# Patient Record
Sex: Female | Born: 1950 | Hispanic: Yes | Smoking: Former smoker
Health system: Southern US, Community
[De-identification: ages and names within clinical notes are randomized; demographics above are authoritative.]

## PROBLEM LIST (undated history)

## (undated) DIAGNOSIS — R519 Headache, unspecified: Secondary | ICD-10-CM

## (undated) DIAGNOSIS — H9190 Unspecified hearing loss, unspecified ear: Secondary | ICD-10-CM

## (undated) DIAGNOSIS — K589 Irritable bowel syndrome without diarrhea: Secondary | ICD-10-CM

## (undated) DIAGNOSIS — R011 Cardiac murmur, unspecified: Secondary | ICD-10-CM

## (undated) DIAGNOSIS — I219 Acute myocardial infarction, unspecified: Secondary | ICD-10-CM

## (undated) DIAGNOSIS — G8929 Other chronic pain: Secondary | ICD-10-CM

## (undated) DIAGNOSIS — Z9289 Personal history of other medical treatment: Secondary | ICD-10-CM

## (undated) DIAGNOSIS — E119 Type 2 diabetes mellitus without complications: Secondary | ICD-10-CM

## (undated) DIAGNOSIS — M549 Dorsalgia, unspecified: Secondary | ICD-10-CM

## (undated) DIAGNOSIS — K219 Gastro-esophageal reflux disease without esophagitis: Secondary | ICD-10-CM

## (undated) DIAGNOSIS — I2510 Atherosclerotic heart disease of native coronary artery without angina pectoris: Secondary | ICD-10-CM

## (undated) DIAGNOSIS — I509 Heart failure, unspecified: Secondary | ICD-10-CM

## (undated) DIAGNOSIS — N186 End stage renal disease: Secondary | ICD-10-CM

## (undated) DIAGNOSIS — E785 Hyperlipidemia, unspecified: Secondary | ICD-10-CM

## (undated) DIAGNOSIS — G4489 Other headache syndrome: Secondary | ICD-10-CM

## (undated) DIAGNOSIS — J189 Pneumonia, unspecified organism: Secondary | ICD-10-CM

## (undated) DIAGNOSIS — I350 Nonrheumatic aortic (valve) stenosis: Secondary | ICD-10-CM

## (undated) DIAGNOSIS — D649 Anemia, unspecified: Secondary | ICD-10-CM

## (undated) DIAGNOSIS — R269 Unspecified abnormalities of gait and mobility: Secondary | ICD-10-CM

## (undated) DIAGNOSIS — E039 Hypothyroidism, unspecified: Secondary | ICD-10-CM

## (undated) DIAGNOSIS — I639 Cerebral infarction, unspecified: Secondary | ICD-10-CM

## (undated) DIAGNOSIS — R51 Headache: Secondary | ICD-10-CM

## (undated) DIAGNOSIS — Z992 Dependence on renal dialysis: Secondary | ICD-10-CM

## (undated) DIAGNOSIS — M47812 Spondylosis without myelopathy or radiculopathy, cervical region: Secondary | ICD-10-CM

## (undated) DIAGNOSIS — M12811 Other specific arthropathies, not elsewhere classified, right shoulder: Secondary | ICD-10-CM

## (undated) DIAGNOSIS — M1990 Unspecified osteoarthritis, unspecified site: Secondary | ICD-10-CM

## (undated) DIAGNOSIS — I10 Essential (primary) hypertension: Secondary | ICD-10-CM

## (undated) DIAGNOSIS — J45909 Unspecified asthma, uncomplicated: Secondary | ICD-10-CM

## (undated) HISTORY — DX: Spondylosis without myelopathy or radiculopathy, cervical region: M47.812

## (undated) HISTORY — PX: CORONARY ANGIOPLASTY WITH STENT PLACEMENT: SHX49

## (undated) HISTORY — DX: Atherosclerotic heart disease of native coronary artery without angina pectoris: I25.10

## (undated) HISTORY — PX: UMBILICAL HERNIA REPAIR: SHX196

## (undated) HISTORY — PX: CHOLECYSTECTOMY OPEN: SUR202

## (undated) HISTORY — PX: CORONARY ANGIOPLASTY: SHX604

## (undated) HISTORY — PX: PARATHYROIDECTOMY: SHX19

## (undated) HISTORY — DX: Anemia, unspecified: D64.9

## (undated) HISTORY — DX: Acute myocardial infarction, unspecified: I21.9

## (undated) HISTORY — DX: Heart failure, unspecified: I50.9

## (undated) HISTORY — DX: Cerebral infarction, unspecified: I63.9

## (undated) HISTORY — DX: Other headache syndrome: G44.89

## (undated) HISTORY — DX: Unspecified abnormalities of gait and mobility: R26.9

## (undated) HISTORY — PX: APPENDECTOMY: SHX54

---

## 1987-06-09 HISTORY — PX: TUBAL LIGATION: SHX77

## 1994-06-08 DIAGNOSIS — Z9289 Personal history of other medical treatment: Secondary | ICD-10-CM

## 1994-06-08 HISTORY — DX: Personal history of other medical treatment: Z92.89

## 2002-06-08 DIAGNOSIS — I219 Acute myocardial infarction, unspecified: Secondary | ICD-10-CM

## 2002-06-08 HISTORY — DX: Acute myocardial infarction, unspecified: I21.9

## 2006-11-03 ENCOUNTER — Ambulatory Visit: Payer: Self-pay | Admitting: Vascular Surgery

## 2006-11-22 ENCOUNTER — Ambulatory Visit: Payer: Self-pay | Admitting: Vascular Surgery

## 2006-12-03 ENCOUNTER — Ambulatory Visit: Payer: Self-pay | Admitting: Vascular Surgery

## 2006-12-03 ENCOUNTER — Ambulatory Visit (HOSPITAL_COMMUNITY): Admission: RE | Admit: 2006-12-03 | Discharge: 2006-12-03 | Payer: Self-pay | Admitting: Vascular Surgery

## 2006-12-03 HISTORY — PX: AV FISTULA PLACEMENT: SHX1204

## 2007-11-01 ENCOUNTER — Ambulatory Visit: Payer: Self-pay | Admitting: Vascular Surgery

## 2008-06-08 HISTORY — PX: INSERTION OF DIALYSIS CATHETER: SHX1324

## 2008-09-10 ENCOUNTER — Inpatient Hospital Stay (HOSPITAL_COMMUNITY): Admission: RE | Admit: 2008-09-10 | Discharge: 2008-09-16 | Payer: Self-pay | Admitting: Surgery

## 2008-09-10 ENCOUNTER — Encounter (INDEPENDENT_AMBULATORY_CARE_PROVIDER_SITE_OTHER): Payer: Self-pay | Admitting: Surgery

## 2009-10-16 ENCOUNTER — Inpatient Hospital Stay (HOSPITAL_COMMUNITY): Admission: EM | Admit: 2009-10-16 | Discharge: 2009-10-18 | Payer: Self-pay | Admitting: Emergency Medicine

## 2009-10-18 ENCOUNTER — Encounter (INDEPENDENT_AMBULATORY_CARE_PROVIDER_SITE_OTHER): Payer: Self-pay | Admitting: Internal Medicine

## 2009-11-15 ENCOUNTER — Ambulatory Visit: Payer: Self-pay | Admitting: Vascular Surgery

## 2009-11-19 ENCOUNTER — Emergency Department (HOSPITAL_COMMUNITY): Admission: EM | Admit: 2009-11-19 | Discharge: 2009-11-19 | Payer: Self-pay | Admitting: Emergency Medicine

## 2009-11-25 ENCOUNTER — Ambulatory Visit (HOSPITAL_COMMUNITY): Admission: RE | Admit: 2009-11-25 | Discharge: 2009-11-25 | Payer: Self-pay | Admitting: Vascular Surgery

## 2009-11-25 ENCOUNTER — Ambulatory Visit: Payer: Self-pay | Admitting: Vascular Surgery

## 2009-11-25 HISTORY — PX: AV FISTULA REPAIR: SHX563

## 2010-07-30 HISTORY — PX: COLONOSCOPY W/ POLYPECTOMY: SHX1380

## 2010-08-12 ENCOUNTER — Emergency Department (HOSPITAL_COMMUNITY)
Admission: EM | Admit: 2010-08-12 | Discharge: 2010-08-12 | Disposition: A | Discharge status: Home / Self Care | Payer: Managed Care, Other (non HMO) | Attending: Emergency Medicine | Admitting: Emergency Medicine

## 2010-08-12 DIAGNOSIS — E119 Type 2 diabetes mellitus without complications: Secondary | ICD-10-CM | POA: Insufficient documentation

## 2010-08-12 DIAGNOSIS — Z79899 Other long term (current) drug therapy: Secondary | ICD-10-CM | POA: Insufficient documentation

## 2010-08-12 DIAGNOSIS — M545 Low back pain, unspecified: Secondary | ICD-10-CM | POA: Insufficient documentation

## 2010-08-12 DIAGNOSIS — Z7982 Long term (current) use of aspirin: Secondary | ICD-10-CM | POA: Insufficient documentation

## 2010-08-12 DIAGNOSIS — R109 Unspecified abdominal pain: Secondary | ICD-10-CM | POA: Insufficient documentation

## 2010-08-12 DIAGNOSIS — Z992 Dependence on renal dialysis: Secondary | ICD-10-CM | POA: Insufficient documentation

## 2010-08-12 DIAGNOSIS — N189 Chronic kidney disease, unspecified: Secondary | ICD-10-CM | POA: Insufficient documentation

## 2010-08-12 DIAGNOSIS — I129 Hypertensive chronic kidney disease with stage 1 through stage 4 chronic kidney disease, or unspecified chronic kidney disease: Secondary | ICD-10-CM | POA: Insufficient documentation

## 2010-08-12 DIAGNOSIS — M546 Pain in thoracic spine: Secondary | ICD-10-CM | POA: Insufficient documentation

## 2010-08-12 LAB — URINALYSIS, ROUTINE W REFLEX MICROSCOPIC
Bilirubin Urine: NEGATIVE
Glucose, UA: NEGATIVE mg/dL
Ketones, ur: NEGATIVE mg/dL
Leukocytes, UA: NEGATIVE
pH: 7 (ref 5.0–8.0)

## 2010-08-12 LAB — URINE MICROSCOPIC-ADD ON

## 2010-08-18 LAB — GLUCOSE, CAPILLARY: Glucose-Capillary: 121 mg/dL — ABNORMAL HIGH (ref 70–99)

## 2010-08-24 LAB — POCT I-STAT 4, (NA,K, GLUC, HGB,HCT)
HCT: 38 % (ref 36.0–46.0)
Hemoglobin: 12.9 g/dL (ref 12.0–15.0)
Potassium: 4.1 mEq/L (ref 3.5–5.1)

## 2010-08-25 LAB — COMPREHENSIVE METABOLIC PANEL
ALT: 21 U/L (ref 0–35)
AST: 22 U/L (ref 0–37)
Alkaline Phosphatase: 86 U/L (ref 39–117)
BUN: 72 mg/dL — ABNORMAL HIGH (ref 6–23)
Calcium: 7.2 mg/dL — ABNORMAL LOW (ref 8.4–10.5)
Chloride: 97 mEq/L (ref 96–112)
Creatinine, Ser: 9.14 mg/dL — ABNORMAL HIGH (ref 0.4–1.2)
GFR calc Af Amer: 5 mL/min — ABNORMAL LOW (ref >60)
GFR calc non Af Amer: 4 mL/min — ABNORMAL LOW (ref >60)
Sodium: 135 mEq/L (ref 135–145)

## 2010-08-25 LAB — URINALYSIS, ROUTINE W REFLEX MICROSCOPIC
Ketones, ur: NEGATIVE mg/dL
Leukocytes, UA: NEGATIVE
Nitrite: NEGATIVE
Specific Gravity, Urine: 1.011 (ref 1.005–1.030)
Urobilinogen, UA: 0.2 mg/dL (ref 0.0–1.0)
pH: 5.5 (ref 5.0–8.0)

## 2010-08-25 LAB — DIFFERENTIAL
Basophils Absolute: 0 10*3/uL (ref 0.0–0.1)
Basophils Relative: 1 % (ref 0–1)
Eosinophils Absolute: 0.8 10*3/uL — ABNORMAL HIGH (ref 0.0–0.7)
Lymphs Abs: 1.5 10*3/uL (ref 0.7–4.0)
Neutrophils Relative %: 61 % (ref 43–77)

## 2010-08-25 LAB — CBC
Hemoglobin: 11.8 g/dL — ABNORMAL LOW (ref 12.0–15.0)
MCHC: 34.5 g/dL (ref 30.0–36.0)
WBC: 7.5 10*3/uL (ref 4.0–10.5)

## 2010-08-25 LAB — LIPASE, BLOOD: Lipase: 34 U/L (ref 11–59)

## 2010-08-25 LAB — URINE MICROSCOPIC-ADD ON

## 2010-08-26 LAB — URINALYSIS, ROUTINE W REFLEX MICROSCOPIC
Bilirubin Urine: NEGATIVE
Glucose, UA: 100 mg/dL — AB
Protein, ur: 100 mg/dL — AB
Specific Gravity, Urine: 1.011 (ref 1.005–1.030)
Urobilinogen, UA: 0.2 mg/dL (ref 0.0–1.0)

## 2010-08-26 LAB — BASIC METABOLIC PANEL
BUN: 42 mg/dL — ABNORMAL HIGH (ref 6–23)
CO2: 22 mEq/L (ref 19–32)
Chloride: 98 mEq/L (ref 96–112)
GFR calc non Af Amer: 6 mL/min — ABNORMAL LOW (ref >60)
Glucose, Bld: 176 mg/dL — ABNORMAL HIGH (ref 70–99)
Potassium: 4.4 mEq/L (ref 3.5–5.1)

## 2010-08-26 LAB — URINE MICROSCOPIC-ADD ON

## 2010-08-26 LAB — POCT CARDIAC MARKERS
Myoglobin, poc: 431 ng/mL (ref 12–200)
Myoglobin, poc: 460 ng/mL (ref 12–200)

## 2010-08-26 LAB — COMPREHENSIVE METABOLIC PANEL
AST: 22 U/L (ref 0–37)
Albumin: 3.8 g/dL (ref 3.5–5.2)
Calcium: 9.4 mg/dL (ref 8.4–10.5)
Creatinine, Ser: 5.47 mg/dL — ABNORMAL HIGH (ref 0.4–1.2)
GFR calc Af Amer: 10 mL/min — ABNORMAL LOW (ref >60)
GFR calc non Af Amer: 8 mL/min — ABNORMAL LOW (ref >60)
Total Protein: 8.5 g/dL — ABNORMAL HIGH (ref 6.0–8.3)

## 2010-08-26 LAB — POCT I-STAT, CHEM 8
BUN: 35 mg/dL — ABNORMAL HIGH (ref 6–23)
Calcium, Ion: 1.07 mmol/L — ABNORMAL LOW (ref 1.12–1.32)
Chloride: 102 mEq/L (ref 96–112)
Glucose, Bld: 186 mg/dL — ABNORMAL HIGH (ref 70–99)
TCO2: 27 mmol/L (ref 0–100)

## 2010-08-26 LAB — RENAL FUNCTION PANEL
Albumin: 3.6 g/dL (ref 3.5–5.2)
CO2: 24 mEq/L (ref 19–32)
Calcium: 8.5 mg/dL (ref 8.4–10.5)
Chloride: 101 mEq/L (ref 96–112)
GFR calc Af Amer: 6 mL/min — ABNORMAL LOW (ref >60)
GFR calc non Af Amer: 5 mL/min — ABNORMAL LOW (ref >60)
Sodium: 136 mEq/L (ref 135–145)

## 2010-08-26 LAB — OVA AND PARASITE EXAMINATION

## 2010-08-26 LAB — CBC
HCT: 30 % — ABNORMAL LOW (ref 36.0–46.0)
HCT: 35.1 % — ABNORMAL LOW (ref 36.0–46.0)
Hemoglobin: 12.2 g/dL (ref 12.0–15.0)
MCHC: 34.3 g/dL (ref 30.0–36.0)
MCHC: 34.9 g/dL (ref 30.0–36.0)
MCV: 104.8 fL — ABNORMAL HIGH (ref 78.0–100.0)
MCV: 105.5 fL — ABNORMAL HIGH (ref 78.0–100.0)
Platelets: 252 10*3/uL (ref 150–400)
Platelets: 272 10*3/uL (ref 150–400)
RBC: 3.36 MIL/uL — ABNORMAL LOW (ref 3.87–5.11)
RDW: 16.8 % — ABNORMAL HIGH (ref 11.5–15.5)
WBC: 13.5 10*3/uL — ABNORMAL HIGH (ref 4.0–10.5)
WBC: 8.5 10*3/uL (ref 4.0–10.5)

## 2010-08-26 LAB — GLUCOSE, CAPILLARY
Glucose-Capillary: 156 mg/dL — ABNORMAL HIGH (ref 70–99)
Glucose-Capillary: 172 mg/dL — ABNORMAL HIGH (ref 70–99)
Glucose-Capillary: 173 mg/dL — ABNORMAL HIGH (ref 70–99)
Glucose-Capillary: 186 mg/dL — ABNORMAL HIGH (ref 70–99)
Glucose-Capillary: 189 mg/dL — ABNORMAL HIGH (ref 70–99)

## 2010-08-26 LAB — CULTURE, BLOOD (ROUTINE X 2): Culture: NO GROWTH

## 2010-08-26 LAB — DIFFERENTIAL
Lymphocytes Relative: 7 % — ABNORMAL LOW (ref 12–46)
Monocytes Relative: 4 % (ref 3–12)
Neutro Abs: 11.5 10*3/uL — ABNORMAL HIGH (ref 1.7–7.7)
Neutrophils Relative %: 85 % — ABNORMAL HIGH (ref 43–77)

## 2010-08-26 LAB — CARDIAC PANEL(CRET KIN+CKTOT+MB+TROPI)
CK, MB: 1.1 ng/mL (ref 0.3–4.0)
Relative Index: 1.1 (ref 0.0–2.5)
Total CK: 100 U/L (ref 7–177)
Troponin I: 0.02 ng/mL (ref 0.00–0.06)
Troponin I: 0.11 ng/mL — ABNORMAL HIGH (ref 0.00–0.06)

## 2010-08-26 LAB — LIPID PANEL
LDL Cholesterol: UNDETERMINED mg/dL (ref 0–99)
Triglycerides: 443 mg/dL — ABNORMAL HIGH (ref <150)
VLDL: UNDETERMINED mg/dL (ref 0–40)

## 2010-08-26 LAB — HEMOGLOBIN A1C: Mean Plasma Glucose: 151 mg/dL — ABNORMAL HIGH (ref <117)

## 2010-08-26 LAB — CK TOTAL AND CKMB (NOT AT ARMC): Relative Index: INVALID (ref 0.0–2.5)

## 2010-09-17 LAB — RENAL FUNCTION PANEL
Albumin: 3.2 g/dL — ABNORMAL LOW (ref 3.5–5.2)
Albumin: 3.3 g/dL — ABNORMAL LOW (ref 3.5–5.2)
Albumin: 3.4 g/dL — ABNORMAL LOW (ref 3.5–5.2)
Albumin: 3.9 g/dL (ref 3.5–5.2)
BUN: 68 mg/dL — ABNORMAL HIGH (ref 6–23)
BUN: 71 mg/dL — ABNORMAL HIGH (ref 6–23)
BUN: 92 mg/dL — ABNORMAL HIGH (ref 6–23)
BUN: 94 mg/dL — ABNORMAL HIGH (ref 6–23)
BUN: 98 mg/dL — ABNORMAL HIGH (ref 6–23)
BUN: 99 mg/dL — ABNORMAL HIGH (ref 6–23)
CO2: 19 mEq/L (ref 19–32)
CO2: 19 mEq/L (ref 19–32)
CO2: 22 mEq/L (ref 19–32)
CO2: 22 mEq/L (ref 19–32)
CO2: 23 mEq/L (ref 19–32)
Calcium: 6.8 mg/dL — ABNORMAL LOW (ref 8.4–10.5)
Calcium: 7.1 mg/dL — ABNORMAL LOW (ref 8.4–10.5)
Calcium: 7.2 mg/dL — ABNORMAL LOW (ref 8.4–10.5)
Chloride: 103 mEq/L (ref 96–112)
Chloride: 105 mEq/L (ref 96–112)
Chloride: 105 mEq/L (ref 96–112)
Chloride: 97 mEq/L (ref 96–112)
Chloride: 99 mEq/L (ref 96–112)
Creatinine, Ser: 4.53 mg/dL — ABNORMAL HIGH (ref 0.4–1.2)
Creatinine, Ser: 7.39 mg/dL — ABNORMAL HIGH (ref 0.4–1.2)
Creatinine, Ser: 7.89 mg/dL — ABNORMAL HIGH (ref 0.4–1.2)
Creatinine, Ser: 7.93 mg/dL — ABNORMAL HIGH (ref 0.4–1.2)
GFR calc Af Amer: 6 mL/min — ABNORMAL LOW (ref >60)
GFR calc Af Amer: 7 mL/min — ABNORMAL LOW (ref >60)
GFR calc Af Amer: 7 mL/min — ABNORMAL LOW (ref >60)
GFR calc Af Amer: 7 mL/min — ABNORMAL LOW (ref >60)
GFR calc Af Amer: 9 mL/min — ABNORMAL LOW (ref >60)
GFR calc non Af Amer: 6 mL/min — ABNORMAL LOW (ref >60)
GFR calc non Af Amer: 6 mL/min — ABNORMAL LOW (ref >60)
GFR calc non Af Amer: 7 mL/min — ABNORMAL LOW (ref >60)
Glucose, Bld: 110 mg/dL — ABNORMAL HIGH (ref 70–99)
Glucose, Bld: 113 mg/dL — ABNORMAL HIGH (ref 70–99)
Glucose, Bld: 113 mg/dL — ABNORMAL HIGH (ref 70–99)
Glucose, Bld: 129 mg/dL — ABNORMAL HIGH (ref 70–99)
Glucose, Bld: 129 mg/dL — ABNORMAL HIGH (ref 70–99)
Glucose, Bld: 133 mg/dL — ABNORMAL HIGH (ref 70–99)
Phosphorus: 3.4 mg/dL (ref 2.3–4.6)
Phosphorus: 3.6 mg/dL (ref 2.3–4.6)
Phosphorus: 3.7 mg/dL (ref 2.3–4.6)
Potassium: 3.3 mEq/L — ABNORMAL LOW (ref 3.5–5.1)
Potassium: 3.5 mEq/L (ref 3.5–5.1)
Potassium: 3.5 mEq/L (ref 3.5–5.1)
Potassium: 3.6 mEq/L (ref 3.5–5.1)
Potassium: 3.8 mEq/L (ref 3.5–5.1)
Potassium: 4.1 mEq/L (ref 3.5–5.1)
Sodium: 133 mEq/L — ABNORMAL LOW (ref 135–145)
Sodium: 136 mEq/L (ref 135–145)
Sodium: 137 mEq/L (ref 135–145)
Sodium: 137 mEq/L (ref 135–145)
Sodium: 139 mEq/L (ref 135–145)

## 2010-09-17 LAB — CALCIUM: Calcium: 9.2 mg/dL (ref 8.4–10.5)

## 2010-09-17 LAB — BASIC METABOLIC PANEL
BUN: 54 mg/dL — ABNORMAL HIGH (ref 6–23)
CO2: 25 mEq/L (ref 19–32)
Calcium: 10.5 mg/dL (ref 8.4–10.5)
Creatinine, Ser: 3.73 mg/dL — ABNORMAL HIGH (ref 0.4–1.2)
GFR calc non Af Amer: 12 mL/min — ABNORMAL LOW (ref >60)
Glucose, Bld: 95 mg/dL (ref 70–99)
Sodium: 141 mEq/L (ref 135–145)

## 2010-09-17 LAB — CBC
Hemoglobin: 12.3 g/dL (ref 12.0–15.0)
Hemoglobin: 13.1 g/dL (ref 12.0–15.0)
MCHC: 34.4 g/dL (ref 30.0–36.0)
Platelets: 192 10*3/uL (ref 150–400)
RBC: 3.51 MIL/uL — ABNORMAL LOW (ref 3.87–5.11)
RDW: 13.1 % (ref 11.5–15.5)
WBC: 7.8 10*3/uL (ref 4.0–10.5)

## 2010-09-17 LAB — DIFFERENTIAL
Basophils Absolute: 0 10*3/uL (ref 0.0–0.1)
Basophils Relative: 0 % (ref 0–1)
Lymphocytes Relative: 19 % (ref 12–46)
Monocytes Absolute: 0.5 10*3/uL (ref 0.1–1.0)
Neutro Abs: 3.3 10*3/uL (ref 1.7–7.7)
Neutrophils Relative %: 55 % (ref 43–77)

## 2010-09-17 LAB — PTH, INTACT AND CALCIUM
Calcium, Total (PTH): 7.4 mg/dL — ABNORMAL LOW (ref 8.4–10.5)
PTH: <2.5 pg/mL — ABNORMAL LOW (ref 14.0–72.0)

## 2010-09-17 LAB — POTASSIUM: Potassium: 4.1 mEq/L (ref 3.5–5.1)

## 2010-09-17 LAB — PROTIME-INR: Prothrombin Time: 14.1 seconds (ref 11.6–15.2)

## 2010-10-21 NOTE — Op Note (Signed)
NAMENASRIN, INSLEY            ACCOUNT NO.:  [id number]   MEDICAL RECORD NO.:  [id number]          PATIENT TYPE:  AMB   LOCATION:  SDS                          FACILITY:  [facility]   PHYSICIAN:  Judeth Cornfield. Scot Dock, M.D.DATE OF BIRTH:  12-Apr-1951   DATE OF PROCEDURE:  12/03/2006  DATE OF DISCHARGE:                               OPERATIVE REPORT   PREOPERATIVE DIAGNOSIS:  Chronic renal failure.   POSTOPERATIVE DIAGNOSIS:  Chronic renal failure.   PROCEDURE:  Placement of new left upper arm arteriovenous fistula.   SURGEON:  Judeth Cornfield. Scot Dock, MD   ASSISTANT:  Tanya Nones, RNFA   ANESTHESIA:  Local with sedation.   TECHNIQUE:  The patient was taken to the operating room and sedated by  Anesthesia.  The left upper extremity was prepped and draped in the  usual sterile fashion.  After the skin was infiltrated with 1%  lidocaine, an incision was made over the cephalic vein at the wrist;  however, upon exploration, this vein turned out to be quite small and I  did not think this was a good vein for a fistula in the forearm.  This  wound was closed with a deep layer of 3-0 Vicryl and the skin closed  with 4-0 Vicryl.  Next, I explored the upper arm cephalic vein through a  transverse incision after the skin was anesthetized.  The upper arm  cephalic vein was much larger and more superficial; I thought this was a  good vein for a fistula.  Beneath the fascia, the brachial artery was  dissected free.  The vein was ligated distally and irrigated up nicely  with heparinized saline; it easily took a 5-mm dilator.  The artery was  clamped proximally and distally and a longitudinal arteriotomy was made.  The vein was mobilized over and sewn end-to-side to the artery using  continuous 6-0 Prolene suture.  Of note, this may in fact have been the  radial artery, but was reasonable size.  At the completion, there was a  good thrill in the fistula and a good radial and ulnar signal with  the  Doppler.  Hemostasis was obtained in the wound.  The wound was closed  with deep layer of 3-0 Vicryl and the skin closed with 4-0 Vicryl.  A  sterile dressing was applied.  The patient tolerated the procedure well  and was transferred to the recovery room in satisfactory condition.  All  needle and sponge counts were correct.      Judeth Cornfield. Scot Dock, M.D.  Electronically Signed     CSD/MEDQ  D:  12/03/2006  T:  12/04/2006  Job:  [id number]

## 2010-10-21 NOTE — Op Note (Signed)
Roberta Bryant, Roberta Bryant            ACCOUNT NO.:  [id number]   MEDICAL RECORD NO.:  [id number]           PATIENT TYPE:   LOCATION:                                 FACILITY:   PHYSICIAN:  Earnstine Regal, MD      DATE OF BIRTH:  [birth year]   DATE OF PROCEDURE:  09/10/2008  DATE OF DISCHARGE:                               OPERATIVE REPORT   PREOPERATIVE DIAGNOSES:  Secondary hyperparathyroidism, chronic renal  insufficiency   POSTOPERATIVE DIAGNOSES:  Secondary hyperparathyroidism, chronic renal  insufficiency   PROCEDURE:  1. Total parathyroidectomy.  2. Autotransplantation parathyroid tissue to right brachial radialis      muscle.  3. Excision of skin tag, anterior neck.   SURGEON:  Earnstine Regal, MD, FACS   ASSISTANT:  Sammuel Hines. Daiva Nakayama, MD, FACS   ANESTHESIA:  General.   ESTIMATED BLOOD LOSS:  Minimal.   PREPARATION:  Betadine.   COMPLICATIONS:  None.   INDICATIONS:  The patient is a 60 year old Hispanic female referred by  Dr. Jamal Maes for secondary hyperparathyroidism.  The patient had  been noted to have an elevated intact PTH level in excess of 800.  Calcium levels were elevated at 11.1.  Phosphorus level was slightly  elevated at 5.0.  The patient was unable to take Sensipar.  She is  referred for parathyroidectomy.   BODY OF REPORT:  Procedure was done in OR #60 at [place]. [facility]  The patient was brought to the operating room and placed in  the supine position on the operating room table.  Following  administration of general anesthesia, the patient was positioned and  then prepped and draped in the usual strict aseptic fashion.  After  ascertaining that an adequate level of anesthesia had been achieved, a  Kocher incision was made with a #15 blade.  Dissection was carried down  through the subcutaneous tissues and platysma.  Hemostasis was obtained  with electrocautery.  Skin flaps were elevated cephalad and caudad from  the thyroid  notch to the sternal notch.  A Mahorner self-retaining  retractor was placed for exposure.  Strap muscle was incised on the  midline and the left thyroid lobe was exposed.  Strap muscles were  reflected laterally.  Left lobe was mobilized.  Larger venous  tributaries were divided between Ligaclips.  Exploration revealed a  parathyroid gland on the inferior pole of the left thyroid lobe.  This  was attached to the capsule.  It was gently dissected off.  Small  vascular tributaries were cauterized with electrocautery.  It was  completely excised and submitted to pathology where Dr. Enid Cutter  confirmed parathyroid tissue.  A second parathyroid gland was noted on  the left side just superior to the inferior thyroid artery on the  thyroid capsule.  It was gently dissected off.  It was slightly to  moderately enlarged.  Vascular pedicles were divided between small  Ligaclips.  The entire gland was excised and a fragment of the gland was  submitted to pathology for frozen section.  Frozen section biopsy  confirmed parathyroid tissue.  The remainder of the gland was placed in  iced saline on the back table.  Dry pack was placed in the left neck.   Next, we turned our attention to the right thyroid lobe.  Again, strap  muscles were reflected laterally and venous tributaries were divided  between medium Ligaclips.  Right gland was fully mobilized.  Exploration  reveals a markedly enlarged right superior parathyroid gland attached to  the posterior aspect of the upper pole.  This was gently dissected out  and hemostasis was obtained with electrocautery.  Vascular tributaries  were divided between small Ligaclips.  The entire gland was excised.  It  measures approximately 2 cm in greatest diameter.  A fragment was  excised and submitted to pathology where frozen section confirmed  parathyroid tissue.  The remainder of the gland ess placed in iced  saline on the back table.  Further exploration  again revealed a small  approximately normal-sized parathyroid gland on the inferior pole of the  right lobe.  This was gently dissected away from the thyroid capsule and  hemostasis was obtained with electrocautery.  Specimen was submitted to  pathology and parathyroid tissue was confirmed on frozen section biopsy.   Good hemostasis was obtained bilaterally.  Surgicel was placed in the  operative field bilaterally.  Strap muscles were reapproximated in the  midline with interrupted 3-0 Vicryl sutures.  Platysma was closed with  interrupted 3-0 Vicryl sutures.  Skin was closed with running 4-0  Monocryl subcuticular suture.  Wound was washed and dried, and Steri-  Strips were applied.  Sterile dressings were applied.   Next, the right arm was placed on an arm board at 90 degrees to the  side.  It was then prepped and draped in the usual strict aseptic  fashion.  After ascertaining that an adequate level of anesthesia had  been maintained, a skin incision was made over the right brachial  radialis muscle for approximately 5 cm.  Dissection was carried down  through the subcutaneous tissues and hemostasis was obtained with  electrocautery.  Skin flaps were developed circumferentially and a  Weitlaner retractor was placed for exposure.  The left superior  parathyroid gland was selected.  In iced saline, it was cut into 10 one-  mm fragments.  The remaining parathyroid tissue was submitted to  pathology.  The 10 fragments are then implanted into the right brachial  radialis muscle by making an incision in the muscle fascia with a #15  blade, creating a submuscular pocket, inserting a fragment of  parathyroid tissue, and closing the overlying muscle fascia with  interrupted 4-0 Prolene suture.  This exercise was repeated 10 times.  Good hemostasis was noted.  Subcutaneous tissues were reapproximated  with interrupted 3-0 Vicryl sutures.  Skin was closed with running 4-0  Monocryl  subcuticular suture.  Wound was washed and dried, and Steri-  Strips were applied.  Sterile dressings were applied.  The patient was  awakened from anesthesia and brought to the recovery room in stable  condition.  The patient tolerated the entire procedure very well.      Earnstine Regal, MD  Electronically Signed     Earnstine Regal, MD  Electronically Signed    TMG/MEDQ  D:  09/10/2008  T:  09/11/2008  Job:  [id number]   cc:   Elzie Rings. Lorrene Reid, M.D.

## 2010-10-21 NOTE — Assessment & Plan Note (Signed)
OFFICE VISIT   Roberta Bryant, Roberta Bryant  DOB:  [birth year]                                       11/01/2007  [id number]   I saw the patient in the office today complaining of some pain in her  infraclavicular area on the left.  She had a new left upper arm AV  fistula placed in June of 2008.  She is not yet on dialysis.  She had  noticed some bulge near her clavicle on the left and was concerned about  this.  She also has had some left shoulder pain.  She has had no fever  or chills.   REVIEW OF SYSTEMS:  She has had no recent chest pain, chest pressure,  palpitations or arrhythmias.   PHYSICAL EXAMINATION:  General:  This is a pleasant 60 year old woman  who appears her stated age.  Vital signs:  Blood pressure is 156/85,  heart rate is 69.  Her incision in the left arm is healed nicely.  She  has an excellent thrill in her left upper arm fistula which has matured  nicely.   What she is feeling in the left infraclavicular area is the cephalic  vein.  There is no evidence of phlebitis or cellulitis.  I think she  just has a well matured fistula.  I have reassured her that this was not  a problem.  She may be having some shoulder pain for other reasons such  as arthritis or bursitis but I do not think it is related to her  fistula.  I will see her back p.r.n.   Judeth Cornfield. Scot Dock, M.D.  Electronically Signed   CSD/MEDQ  D:  11/01/2007  T:  11/02/2007  Job:  [id number]

## 2010-10-21 NOTE — Consult Note (Signed)
VASCULAR SURGERY CONSULTATION   Roberta Bryant, Roberta Bryant  DOB:  March 17, 1951                                       11/03/2006  [id number]   HISTORY:  This is a pleasant 60 year old woman who was referred by Dr.  Justin Mend for hemodialysis access.  Of note, she is right-handed.  She has  end-stage renal disease, I believe secondary to hypertension.  In  addition, she has a history of diabetes.  She did have a myocardial  infarction in 2004 and is followed by Dr. Agustin Cree in [place].   Her past medical history is otherwise significant for diabetic, chronic  renal insufficiency, and hypertension.  She denies any history of  congestive heart failure or history of COPD.   FAMILY HISTORY:  There is no history of premature cardiovascular  disease.   SOCIAL HISTORY:  She is a medical office assistance.  She has 4  children.  She quit tobacco in 1970.   Review of systems and medications are documented on the medical history  form in her chart.   PHYSICAL EXAMINATION:  VITAL SIGNS:  Blood pressure is 127/73 on the  left and 137/84 on the right.  Heart rate is 60.  LUNGS:  Clear bilaterally to auscultation.  CARDIAC:  She has a regular rate and rhythm.  ABDOMEN:  Soft and nontender.  She has a palpable brachial on a radial  pulse bilaterally.   She is somewhat obese, and it is difficult to evaluate her cephalic vein  on exam.   I have recommended that we place an A-V fistula on the left if her vein  is found to be adequate.  If not, we will place an A-V graft.  We will  plan on vein mapping closer to the time of surgery.  She did not want to  have surgery done next week, so she scheduled for November 26, 2006, and she  needs to do this on a Friday because of her work schedule.  We have  discussed the indications for surgery and the potential complications  including but not limited to failure of the fistula to mature, graft  thrombosis, graft infection, steal syndrome,  wound healing problems, and  arm swelling.  All of her questions are answered, and she is agreeable  to proceed.   Judeth Cornfield. Scot Dock, M.D.  Electronically Signed  CSD/MEDQ  D:  11/03/2006  T:  11/03/2006  Job:  [id number]   cc:   Sherril Croon, M.D.

## 2010-10-21 NOTE — Discharge Summary (Signed)
NAMELAKEYDA, BAZZLE            ACCOUNT NO.:  [id number]   MEDICAL RECORD NO.:  [id number]          PATIENT TYPE:  INP   LOCATION:  6706                         FACILITY:  [facility]   PHYSICIAN:  Alvin C. Florene Glen, M.D.  DATE OF BIRTH:  04-26-1951   DATE OF ADMISSION:  09/10/2008  DATE OF DISCHARGE:  09/16/2008                               DISCHARGE SUMMARY   ADMITTING DIAGNOSES:  1. Severe secondary hyperparathyroidism.  2. Chronic kidney disease stage V.  3. Hypertension.  4. Non-insulin dependent type 2 diabetes mellitus.  5. Anemia of chronic disease.  6. Coronary artery disease with history of myocardial infarction in      the past by Dr. Agustin Cree.   DISCHARGE DIAGNOSES:  1. Status post parathyroidectomy with autotransplantation, right      forearm.  2. Secondary hyperparathyroidism.  3. Chronic kidney disease stage V.  4. Hypertension.  5. Type 2 diabetes mellitus.  6. Coronary artery disease with history of myocardial infarction.  7. Anemia of chronic disease.   BRIEF HISTORY:  A 60 year old [place] female followed by Dr. Lorrene Reid with  [facility] for stage V chronic kidney disease secondary  to diabetes and hypertension and has a mature left upper arm AV fistula  created in June 2008.  The patient has known secondary  hyperparathyroidism with hypercalcemia and unable to afford Renagel or  Sensipar.  PTH level has been in the 700 range with calcium in the 11  range.  She received cardiac clearance from Dr. Agustin Cree and  parathyroidectomy with scheduled.  The patient is admitted now for  procedure.  Creatinine as an outpatient has ranged in the 4s.   ADMISSION LABORATORY DATA:  Calcium 9.2 and potassium 4.1.  Hemoglobin  13.1.   HOSPITAL COURSE:  Parathyroidectomy.  The patient underwent total  parathyroidectomy with autotransplantation of one gland on the right  forearm on day of admission.  She tolerated the procedure well.  Postoperatively, calcium  phosphorus levels were followed closely.  She  experienced hungry bones with a rapid drop in calcium to a low of 6.8 on  the second postoperative day.  She complained of mild perioral tingling.  She required several doses of intravenous calcium gluconate in addition  to successive increases in oral vitamin D repletion.  Ultimately on a  dose of calcitriol, 2.0 mcg twice daily along with calcium carbonate 1.5  grams between each meal and at bedtime 4 times daily.  Her calcium  stabilized in the 7 range and was actually on the rise at time of  discharge.  Calcium had gone up from 7.2-7.7 in the last 24 hours of  hospitalization.  Phosphorus was remained stable at approximately 4.2.  She no longer had any numbness or tingling of her face.  Her neck wound  has remained clean and dry with Steri-Strips.  She had showered here in  the hospital and  wound is healing nicely.  At time of discharge, she  has been instructed by Dr. Harlow Asa to followup with his office in 2-3  weeks.  We have instructed the patient to go to [facility] for  laboratory to be done every Monday, Wednesday, Friday until further  notice.  She was given a [facility] prescription stating  renal profile to be drawn every Monday, Wednesday, Friday with results  called to Dr. Sanda Klein office.  1. Chronic kidney disease.  Preoperative BUN and creatinine on September 07, 2008 showed a BUN of 54 and a creatinine 3.73 with a GFR of 12.      On September 11, 2008, first postoperative day, BUN jumped to 68 and      creatinine 4.53.  Thereafter, her creatinine rose steadily and has      remained in the 7 range for the last 3 days.  She is without uremic      symptoms and says she feels well.  Lasix was held on September 14, 2008      and not restarted at the time of discharge.  She is told to stay      off it until further notice and to call [place]      office should she notice lower extremity swelling.   She has a      functioning fistula and is very close to starting dialysis, but we      will await onset of uremic symptoms and/or volume overload.  At      time of discharge, BUN is 98 and creatinine 7.93.  She is eating      and drinking very well and actually we encouraged to increase her      water intake.  Potassium is 3.7, sodium 135, chloride 99, CO2 23 at      time of discharge.  2. Hypertension.  Blood pressure needed improved control.  Clonidine      was increased to 0.2 mg b.i.d.  Blood pressure at time of discharge      is approximately 123/63.  The patient's last known weight on September 14, 2008 is 91.5 kg.   DISCHARGE MEDICATIONS:  1. Clonidine 0.2 mg b.i.d.  2. Omeprazole CR 40 mg daily.  3. Lipitor 20 mg at bedtime.  4. Sodium bicarbonate 650 mg t.i.d.  5. Imdur 30 mg daily.  6. Amlodipine 10 mg daily.  7. Baby aspirin 81 mg daily.  8. Stool softener 300 mg daily.  9. Iron 65 mg daily.  10.Calcium carbonate 500 mg 3 pills between each meal and at bedtime 4      times a day.  11.Calcitriol 0.5 mcg pills 4 pills 2 times a day until further      notice.      Nonah Mattes, P.A.    ______________________________  Darrold Span Florene Glen, M.D.    RRK/MEDQ  D:  09/16/2008  T:  09/17/2008  Job:  [id number]   cc:   Earnstine Regal, MD

## 2010-10-21 NOTE — H&P (Signed)
HISTORY AND PHYSICAL EXAMINATION   November 15, 2009   Re:  Roberta Bryant, Roberta F                  DOB:  1951-02-07   Date of surgery was 12/03/2006, consisting of placement of new left  upper arm AV fistula.   Patient returns to clinic today with complaints of inability to access  her fistula.  She had a fistula placed in 2008 for chronic kidney  disease.  She has now been currently dialyzed through a Diatek catheter.   Past medical history is consistent with diabetes, hypertension, and  hypercholesterolemia.   She has 3 children.  She is retired.  She has a remote history of  tobacco use with discontinuation in 1975.   Review of systems was negative with the exception of a heart murmur.   Physical findings revealed a well-nourished Hispanic woman in no  apparent distress.  Heart rate was 69.  Blood pressure 131/80.  O2  saturation was 98%.  HEENT: EOMI.  Sclerae was nonicteric.  Mucous  membranes were pink and moist.  Neck had a full range of motion.  Trachea was midline.  Lungs were clear to auscultation bilaterally.  Cardiac exam revealed a regular rate and rhythm.  I did appreciate a  murmur in the aortic window.  Abdomen was soft, nontender, nondistended.  Musculoskeletal exam demonstrated no major deformities or cyanosis.  Neurological exam was nonfocal.  Attention was then turned to her  fistula.  The fistula was placed in the left upper arm.  The left upper  arm is extremely massive.  The fistula is palpable.  At this time, I  asked for Dr. Luther Parody assistance with this patient.   She did have a fistulogram which was done at [facility].  Dr.  Donnetta Hutching and I reviewed the fistulogram.  The fistula is of sufficient size  that it could possibly be used.  Dr. Donnetta Hutching then evaluated the fistula,  felt the fistula was too deep for access.  Dr. Donnetta Hutching then outlined 3  possible options:  1.  Do nothing and continue to use the catheter.  2.  Abandon the fistula and  place another access.  3.  To attempt to revise  the fistula to bring it closer to the skin.   Dr. Donnetta Hutching did recommend revision of the fistula.  The patient was  amenable to this.  As she is dialyzed on Tuesdays, Thursdays and  Saturdays, we will proceed with the attempt to revise the fistula on  Monday, June 20.  During the intervening time, perhaps a fistula could  be rested, and no access attempts made on the fistula, as she does have  a working [employer] catheter.   ALLERGIES:  Sulfa, amoxicillin, ibuprofen, __________.   Chad Cordial, PA   Roberta Bryant, M.D.  Electronically Signed   KEL/MEDQ  D:  11/15/2009  T:  11/15/2009  Job:  [id number]   cc:   Dr. Penelope Coop

## 2010-11-06 ENCOUNTER — Emergency Department (HOSPITAL_COMMUNITY)
Admission: EM | Admit: 2010-11-06 | Discharge: 2010-11-06 | Disposition: A | Discharge status: Home / Self Care | Payer: Managed Care, Other (non HMO) | Attending: Emergency Medicine | Admitting: Emergency Medicine

## 2010-11-06 DIAGNOSIS — M25519 Pain in unspecified shoulder: Secondary | ICD-10-CM | POA: Insufficient documentation

## 2010-11-06 DIAGNOSIS — I120 Hypertensive chronic kidney disease with stage 5 chronic kidney disease or end stage renal disease: Secondary | ICD-10-CM | POA: Insufficient documentation

## 2010-11-06 DIAGNOSIS — G8929 Other chronic pain: Secondary | ICD-10-CM | POA: Insufficient documentation

## 2010-11-06 DIAGNOSIS — Z992 Dependence on renal dialysis: Secondary | ICD-10-CM | POA: Insufficient documentation

## 2010-11-06 DIAGNOSIS — M545 Low back pain, unspecified: Secondary | ICD-10-CM | POA: Insufficient documentation

## 2010-11-06 DIAGNOSIS — R209 Unspecified disturbances of skin sensation: Secondary | ICD-10-CM | POA: Insufficient documentation

## 2010-11-06 DIAGNOSIS — E669 Obesity, unspecified: Secondary | ICD-10-CM | POA: Insufficient documentation

## 2010-11-06 DIAGNOSIS — E119 Type 2 diabetes mellitus without complications: Secondary | ICD-10-CM | POA: Insufficient documentation

## 2010-11-06 DIAGNOSIS — Z79899 Other long term (current) drug therapy: Secondary | ICD-10-CM | POA: Insufficient documentation

## 2010-11-06 DIAGNOSIS — N186 End stage renal disease: Secondary | ICD-10-CM | POA: Insufficient documentation

## 2011-01-07 ENCOUNTER — Emergency Department (HOSPITAL_COMMUNITY)
Admission: EM | Admit: 2011-01-07 | Discharge: 2011-01-07 | Disposition: A | Discharge status: Home / Self Care | Payer: Commercial Indemnity | Attending: Emergency Medicine | Admitting: Emergency Medicine

## 2011-01-07 ENCOUNTER — Emergency Department (HOSPITAL_COMMUNITY): Payer: Commercial Indemnity

## 2011-01-07 DIAGNOSIS — Z7982 Long term (current) use of aspirin: Secondary | ICD-10-CM | POA: Insufficient documentation

## 2011-01-07 DIAGNOSIS — M549 Dorsalgia, unspecified: Secondary | ICD-10-CM | POA: Insufficient documentation

## 2011-01-07 DIAGNOSIS — R110 Nausea: Secondary | ICD-10-CM | POA: Insufficient documentation

## 2011-01-07 DIAGNOSIS — E119 Type 2 diabetes mellitus without complications: Secondary | ICD-10-CM | POA: Insufficient documentation

## 2011-01-07 DIAGNOSIS — R109 Unspecified abdominal pain: Secondary | ICD-10-CM | POA: Insufficient documentation

## 2011-01-07 DIAGNOSIS — G8929 Other chronic pain: Secondary | ICD-10-CM | POA: Insufficient documentation

## 2011-01-07 DIAGNOSIS — Z992 Dependence on renal dialysis: Secondary | ICD-10-CM | POA: Insufficient documentation

## 2011-01-07 DIAGNOSIS — N186 End stage renal disease: Secondary | ICD-10-CM | POA: Insufficient documentation

## 2011-01-07 DIAGNOSIS — I120 Hypertensive chronic kidney disease with stage 5 chronic kidney disease or end stage renal disease: Secondary | ICD-10-CM | POA: Insufficient documentation

## 2011-01-07 DIAGNOSIS — Z79899 Other long term (current) drug therapy: Secondary | ICD-10-CM | POA: Insufficient documentation

## 2011-01-07 LAB — CBC
HCT: 33 % — ABNORMAL LOW (ref 36.0–46.0)
MCH: 36.3 pg — ABNORMAL HIGH (ref 26.0–34.0)
MCHC: 34.5 g/dL (ref 30.0–36.0)
MCV: 105.1 fL — ABNORMAL HIGH (ref 78.0–100.0)
RDW: 12.9 % (ref 11.5–15.5)

## 2011-01-07 LAB — HEPATIC FUNCTION PANEL
AST: 26 U/L (ref 0–37)
Bilirubin, Direct: 0.1 mg/dL (ref 0.0–0.3)
Indirect Bilirubin: 0.1 mg/dL — ABNORMAL LOW (ref 0.3–0.9)
Total Bilirubin: 0.2 mg/dL — ABNORMAL LOW (ref 0.3–1.2)

## 2011-01-07 LAB — DIFFERENTIAL
Eosinophils Relative: 12 % — ABNORMAL HIGH (ref 0–5)
Lymphocytes Relative: 31 % (ref 12–46)
Lymphs Abs: 2.2 10*3/uL (ref 0.7–4.0)
Monocytes Absolute: 0.6 10*3/uL (ref 0.1–1.0)
Monocytes Relative: 9 % (ref 3–12)

## 2011-01-07 LAB — URINALYSIS, ROUTINE W REFLEX MICROSCOPIC
Glucose, UA: NEGATIVE mg/dL
Specific Gravity, Urine: 1.007 (ref 1.005–1.030)
Urobilinogen, UA: 0.2 mg/dL (ref 0.0–1.0)

## 2011-01-07 LAB — URINE MICROSCOPIC-ADD ON

## 2011-01-07 MED ORDER — IOHEXOL 300 MG/ML  SOLN
80.0000 mL | Freq: Once | INTRAMUSCULAR | Status: AC | PRN
  Administered 2011-01-07: 80 mL via INTRAVENOUS

## 2011-01-08 LAB — POCT I-STAT, CHEM 8
Creatinine, Ser: 6.2 mg/dL — ABNORMAL HIGH (ref 0.50–1.10)
Hemoglobin: 11.6 g/dL — ABNORMAL LOW (ref 12.0–15.0)
Sodium: 139 mEq/L (ref 135–145)
TCO2: 27 mmol/L (ref 0–100)

## 2011-03-25 LAB — POCT I-STAT 4, (NA,K, GLUC, HGB,HCT)
Glucose, Bld: 118 — ABNORMAL HIGH
Hemoglobin: 11.9 — ABNORMAL LOW
Potassium: 4.8

## 2011-03-25 LAB — PROTIME-INR: INR: 1

## 2011-06-29 ENCOUNTER — Emergency Department (HOSPITAL_COMMUNITY)
Admission: EM | Admit: 2011-06-29 | Discharge: 2011-06-30 | Disposition: A | Discharge status: Home / Self Care | Payer: Managed Care, Other (non HMO) | Attending: Emergency Medicine | Admitting: Emergency Medicine

## 2011-06-29 ENCOUNTER — Encounter (HOSPITAL_COMMUNITY): Payer: Self-pay | Admitting: *Deleted

## 2011-06-29 DIAGNOSIS — Z7982 Long term (current) use of aspirin: Secondary | ICD-10-CM | POA: Insufficient documentation

## 2011-06-29 DIAGNOSIS — X58XXXA Exposure to other specified factors, initial encounter: Secondary | ICD-10-CM | POA: Insufficient documentation

## 2011-06-29 DIAGNOSIS — N186 End stage renal disease: Secondary | ICD-10-CM | POA: Insufficient documentation

## 2011-06-29 DIAGNOSIS — Z992 Dependence on renal dialysis: Secondary | ICD-10-CM | POA: Insufficient documentation

## 2011-06-29 DIAGNOSIS — M542 Cervicalgia: Secondary | ICD-10-CM | POA: Insufficient documentation

## 2011-06-29 DIAGNOSIS — T148XXA Other injury of unspecified body region, initial encounter: Secondary | ICD-10-CM | POA: Insufficient documentation

## 2011-06-29 DIAGNOSIS — Z79899 Other long term (current) drug therapy: Secondary | ICD-10-CM | POA: Insufficient documentation

## 2011-06-29 DIAGNOSIS — I120 Hypertensive chronic kidney disease with stage 5 chronic kidney disease or end stage renal disease: Secondary | ICD-10-CM | POA: Insufficient documentation

## 2011-06-29 DIAGNOSIS — E119 Type 2 diabetes mellitus without complications: Secondary | ICD-10-CM | POA: Insufficient documentation

## 2011-06-29 DIAGNOSIS — M546 Pain in thoracic spine: Secondary | ICD-10-CM | POA: Insufficient documentation

## 2011-06-29 HISTORY — DX: Essential (primary) hypertension: I10

## 2011-06-29 LAB — URINALYSIS, ROUTINE W REFLEX MICROSCOPIC
Ketones, ur: NEGATIVE mg/dL
Leukocytes, UA: NEGATIVE
Nitrite: NEGATIVE
Protein, ur: 30 mg/dL — AB
pH: 7 (ref 5.0–8.0)

## 2011-06-29 NOTE — ED Notes (Signed)
The pt is a dialysis pt and she was dialyzed Saturday.  She is c/o abd pain neck and back pain for 6 weeks.

## 2011-06-30 MED ORDER — DIAZEPAM 5 MG PO TABS: 5.0000 mg | ORAL_TABLET | Freq: Four times a day (QID) | ORAL | Status: AC | PRN

## 2011-06-30 MED ORDER — DIAZEPAM 5 MG PO TABS
5.0000 mg | ORAL_TABLET | Freq: Once | ORAL | Status: AC
  Administered 2011-06-30: 5 mg via ORAL
  Filled 2011-06-30: qty 1

## 2011-06-30 NOTE — ED Provider Notes (Signed)
History     CSN: [id number]  Arrival date & time 06/29/11  2213   First MD Initiated Contact with Patient 06/30/11 0106      Chief Complaint  Patient presents with  . Back Pain     HPI  History provided by the patient. Patient is a 61 year old female with history of end-stage renal disease on dialysis Saturday, Tuesday, Thursday who presents with persistent upper back and neck pains for the past one to 2 months. Patient reports being evaluated for similar symptoms in the past with normal x-ray studies. She reports being given pain medications such as Percocet and Demerol at that time for her symptoms. Patient has since run out of these medicines has not taken anything recently. Patient denies any new injury or trauma. Pain is worse with some movements and lifting objects. Patient denies any other symptoms. Patient had her last dialysis on Saturday and is scheduled for dialysis again later today.   Past Medical History  Diagnosis Date  . Renal disorder   . Hypertension   . Diabetes mellitus     History reviewed. No pertinent past surgical history.  History reviewed. No pertinent family history.  History  Substance Use Topics  . Smoking status: Never Smoker   . Smokeless tobacco: Not on file  . Alcohol Use: No    OB History    Grav Para Term Preterm Abortions TAB SAB Ect Mult Living                  Review of Systems  Respiratory: Negative for shortness of breath.   Cardiovascular: Negative for chest pain.  All other systems reviewed and are negative.    Allergies  Amoxicillin; Ibuprofen; Naldecon senior; and Sulfa antibiotics  Home Medications   Current Outpatient Rx  Name Route Sig Dispense Refill  . ASPIRIN EC 81 MG PO TBEC Oral Take 81 mg by mouth daily.    Marland Kitchen BIOTIN 300 MCG PO TABS Oral Take 1 tablet by mouth daily.    Marland Kitchen CALCIUM ACETATE 667 MG PO CAPS Oral Take 2,668 mg by mouth 3 (three) times daily with meals.    Marland Kitchen CARVEDILOL 25 MG PO TABS Oral Take 25  mg by mouth 2 (two) times daily with a meal.    . DOCUSATE SODIUM 100 MG PO CAPS Oral Take 100 mg by mouth 2 (two) times daily.    . ISOSORBIDE MONONITRATE ER 60 MG PO TB24 Oral Take 60 mg by mouth daily.    Marland Kitchen LEVOTHYROXINE SODIUM 50 MCG PO TABS Oral Take 50 mcg by mouth daily.    Marland Kitchen LOPERAMIDE HCL 2 MG PO CAPS Oral Take 2 mg by mouth 4 (four) times daily as needed. For diarrhea    . MECLIZINE HCL 12.5 MG PO TABS Oral Take 12.5 mg by mouth 3 (three) times daily as needed. For vertigo    . RENA-VITE PO TABS Oral Take 1 tablet by mouth daily.    . OMEGA-3-ACID ETHYL ESTERS 1 G PO CAPS Oral Take 4 g by mouth daily.    Marland Kitchen OMEPRAZOLE 20 MG PO CPDR Oral Take 20 mg by mouth daily.    Marland Kitchen ROSUVASTATIN CALCIUM 20 MG PO TABS Oral Take 20 mg by mouth daily.    . TRAMADOL HCL 50 MG PO TABS Oral Take 50 mg by mouth 2 (two) times daily as needed. For pain    . TRAZODONE HCL 50 MG PO TABS Oral Take 25 mg by mouth daily.    Marland Kitchen  ZINC 50 MG PO CAPS Oral Take 1 capsule by mouth daily.      BP 152/63  Pulse 68  Temp(Src) 98.4 F (36.9 C) (Oral)  Resp 20  SpO2 98%  Physical Exam  Nursing note and vitals reviewed. Constitutional: She is oriented to person, place, and time. She appears well-developed and well-nourished. No distress.  HENT:  Head: Normocephalic and atraumatic.  Cardiovascular: Normal rate and regular rhythm.        Left upper extremity fistula with good bruit and thrill.  Pulmonary/Chest: Effort normal and breath sounds normal.  Musculoskeletal: Normal range of motion. She exhibits no edema.       Cervical back: She exhibits no tenderness and no bony tenderness.       Thoracic back: She exhibits no tenderness and no bony tenderness.       Lumbar back: Normal.       Back:       Patient with mild to moderate tenderness to palpation over upper back and trapezius area. Normal range of motion of neck and arms.  Neurological: She is alert and oriented to person, place, and time.  Skin: Skin is  warm and dry. No rash noted.  Psychiatric: She has a normal mood and affect. Her behavior is normal.    ED Course  Procedures (including critical care time)  Labs Reviewed  URINALYSIS, ROUTINE W REFLEX MICROSCOPIC - Abnormal; Notable for the following:    Hgb urine dipstick MODERATE (*)    Protein, ur 30 (*)    All other components within normal limits  URINE MICROSCOPIC-ADD ON - Abnormal; Notable for the following:    Squamous Epithelial / LPF MANY (*)    Bacteria, UA FEW (*)    All other components within normal limits   Results for orders placed during the hospital encounter of 06/29/11  URINALYSIS, ROUTINE W REFLEX MICROSCOPIC      Component Value Range   Color, Urine YELLOW  YELLOW    APPearance CLEAR  CLEAR    Specific Gravity, Urine 1.005  1.005 - 1.030    pH 7.0  5.0 - 8.0    Glucose, UA NEGATIVE  NEGATIVE (mg/dL)   Hgb urine dipstick MODERATE (*) NEGATIVE    Bilirubin Urine NEGATIVE  NEGATIVE    Ketones, ur NEGATIVE  NEGATIVE (mg/dL)   Protein, ur 30 (*) NEGATIVE (mg/dL)   Urobilinogen, UA 0.2  0.0 - 1.0 (mg/dL)   Nitrite NEGATIVE  NEGATIVE    Leukocytes, UA NEGATIVE  NEGATIVE   URINE MICROSCOPIC-ADD ON      Component Value Range   Squamous Epithelial / LPF MANY (*) RARE    WBC, UA 3-6  <3 (WBC/hpf)   RBC / HPF 3-6  <3 (RBC/hpf)   Bacteria, UA FEW (*) RARE      1. Muscle strain       MDM  1:45 AM patient seen and evaluated. Patient in no acute distress.        Martie Lee, [place] 06/30/11 [phone]

## 2011-06-30 NOTE — ED Provider Notes (Signed)
Medical screening examination/treatment/procedure(s) were performed by non-physician practitioner and as supervising physician I was immediately available for consultation/collaboration.   Johnna Acosta, MD 06/30/11 [phone]

## 2011-11-30 ENCOUNTER — Encounter (HOSPITAL_COMMUNITY): Payer: Self-pay | Admitting: *Deleted

## 2011-11-30 ENCOUNTER — Emergency Department (HOSPITAL_COMMUNITY)
Admission: EM | Admit: 2011-11-30 | Discharge: 2011-11-30 | Disposition: A | Discharge status: Home / Self Care | Payer: Managed Care, Other (non HMO) | Attending: Emergency Medicine | Admitting: Emergency Medicine

## 2011-11-30 DIAGNOSIS — M25519 Pain in unspecified shoulder: Secondary | ICD-10-CM | POA: Insufficient documentation

## 2011-11-30 DIAGNOSIS — G8929 Other chronic pain: Secondary | ICD-10-CM | POA: Insufficient documentation

## 2011-11-30 DIAGNOSIS — I10 Essential (primary) hypertension: Secondary | ICD-10-CM | POA: Insufficient documentation

## 2011-11-30 DIAGNOSIS — E119 Type 2 diabetes mellitus without complications: Secondary | ICD-10-CM | POA: Insufficient documentation

## 2011-11-30 MED ORDER — DIAZEPAM 5 MG PO TABS: 5.0000 mg | ORAL_TABLET | Freq: Two times a day (BID) | ORAL | Status: AC

## 2011-11-30 MED ORDER — OXYCODONE-ACETAMINOPHEN 5-325 MG PO TABS: 1.0000 | ORAL_TABLET | Freq: Four times a day (QID) | ORAL | Status: AC | PRN

## 2011-11-30 MED ORDER — OXYCODONE-ACETAMINOPHEN 5-325 MG PO TABS
2.0000 | ORAL_TABLET | Freq: Once | ORAL | Status: AC
  Administered 2011-11-30: 2 via ORAL
  Filled 2011-11-30: qty 2
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
## 2011-11-30 NOTE — ED Provider Notes (Signed)
Medical screening examination/treatment/procedure(s) were performed by non-physician practitioner and as supervising physician I was immediately available for consultation/collaboration.   Ezequiel Essex, MD 11/30/11 [phone]

## 2011-11-30 NOTE — ED Provider Notes (Signed)
History     CSN: [id number]  Arrival date & time 11/30/11  [id number]   First MD Initiated Contact with Patient 11/30/11 [phone]      Chief Complaint  Patient presents with  . Shoulder Pain  . Hip Pain    (Consider location/radiation/quality/duration/timing/severity/associated sxs/prior treatment) HPI Comments: Patient reports that she began having right shoulder pain since 1998, but the pain has been worse over the past 2 years.  Pain is constant.   Pain located over the right scapula.  Pain gradually worsening.  She reports that she has had several xrays and a MRI of the shoulder done in the past.  She reports that she was told that imaging was normal.  She has also seen an Orthopedist about this pain in the past.  She is not taking anything for pain at this time.  Movement makes the pain worse.  No acute injury or trauma.  The history is provided by the patient.    Past Medical History  Diagnosis Date  . Renal disorder   . Hypertension   . Diabetes mellitus   . Asthma     Past Surgical History  Procedure Date  . Appendectomy   . Cholecystectomy   . Tubal ligation     History reviewed. No pertinent family history.  History  Substance Use Topics  . Smoking status: Never Smoker   . Smokeless tobacco: Not on file  . Alcohol Use: No    OB History    Grav Para Term Preterm Abortions TAB SAB Ect Mult Living                  Review of Systems  Constitutional: Negative for fever and chills.  Gastrointestinal: Negative for nausea and vomiting.  Musculoskeletal: Negative for joint swelling and gait problem.  Skin: Negative for color change.  Neurological: Negative for weakness and numbness.    Allergies  Sulfa antibiotics; Amoxicillin; Ibuprofen; and Naldecon senior  Home Medications   Current Outpatient Rx  Name Route Sig Dispense Refill  . ASPIRIN EC 81 MG PO TBEC Oral Take 81 mg by mouth daily.    Marland Kitchen BIOTIN 300 MCG PO TABS Oral Take 1 tablet by mouth daily.    Marland Kitchen  CALCIUM ACETATE 667 MG PO CAPS Oral Take 1,334 mg by mouth 2 (two) times daily.     Marland Kitchen CARVEDILOL 25 MG PO TABS Oral Take 12.5 mg by mouth daily.     Marland Kitchen DICYCLOMINE HCL 10 MG PO CAPS Oral Take 10 mg by mouth 2 (two) times daily as needed. For stomach pain    . DOCUSATE SODIUM 100 MG PO CAPS Oral Take 100 mg by mouth 2 (two) times daily.    . ISOSORBIDE MONONITRATE ER 30 MG PO TB24 Oral Take 30 mg by mouth daily.    Marland Kitchen LEVOTHYROXINE SODIUM 50 MCG PO TABS Oral Take 50 mcg by mouth daily.    Marland Kitchen LOPERAMIDE HCL 2 MG PO CAPS Oral Take 2 mg by mouth 4 (four) times daily as needed. For diarrhea    . MECLIZINE HCL 12.5 MG PO TABS Oral Take 12.5 mg by mouth 3 (three) times daily as needed. For vertigo    . RENA-VITE PO TABS Oral Take 1 tablet by mouth daily.    . OMEGA-3-ACID ETHYL ESTERS 1 G PO CAPS Oral Take 4 g by mouth daily.    Marland Kitchen OMEPRAZOLE 20 MG PO CPDR Oral Take 20 mg by mouth daily.    Marland Kitchen ROSUVASTATIN CALCIUM  20 MG PO TABS Oral Take 20 mg by mouth daily.    . TRAMADOL HCL 50 MG PO TABS Oral Take 50 mg by mouth 2 (two) times daily as needed. For pain    . TRAZODONE HCL 50 MG PO TABS Oral Take 25 mg by mouth daily.    Marland Kitchen ZINC 50 MG PO CAPS Oral Take 1 capsule by mouth daily.      BP 165/54  Pulse 66  Temp 98.9 F (37.2 C) (Oral)  Resp 20  SpO2 100%  Physical Exam  Nursing note and vitals reviewed. Constitutional: She appears well-developed and well-nourished.  HENT:  Head: Normocephalic and atraumatic.  Neck: Normal range of motion. Neck supple.  Cardiovascular: Normal rate, regular rhythm, normal heart sounds and intact distal pulses.   Pulses:      Radial pulses are 2+ on the right side, and 2+ on the left side.  Pulmonary/Chest: Effort normal and breath sounds normal.  Musculoskeletal:       Right shoulder: She exhibits decreased range of motion and bony tenderness. She exhibits no swelling, no effusion, no deformity and normal pulse.       Tenderness to palpation over the right scapula.   Decreased abduction of the right shoulder.  Pain increased with ROM.  Neurological: She is alert. No sensory deficit. Gait normal.  Skin: Skin is warm and dry. She is not diaphoretic. No erythema.  Psychiatric: She has a normal mood and affect.    ED Course  Procedures (including critical care time)  Labs Reviewed - No data to display No results found.   No diagnosis found.    MDM  Patient with chronic shoulder pain.  She has been thoroughly evaluated in the past with xray and MRI.  She has also seen Orthopedics in the past for the pain.  No acute injury or trauma.  Shoulder is not swollen or erythematous.  Patient given short course of pain medication and discharged home.  Discussed with patient the importance of following up with her PCP for pain management.  Patient also requesting Orthopedic follow up so patient given referral to the Orthopedist on call.        Sherlyn Lees [place], PA-C 11/30/11 [place], PA-C 11/30/11 [phone]

## 2011-11-30 NOTE — ED Notes (Signed)
Family at bedside. 

## 2011-11-30 NOTE — Discharge Instructions (Signed)
Acromioclavicular Injuries The acromioclavicular [facility]) joint is the joint in the shoulder. There are many bands of tissue (ligaments) that surround the [facility] bones and joints. These bands of tissue can tear, which can lead to sprains and separations. The bones of the [facility] joint can also break (fracture).   Only take oxycodone for severe pain.  Do not drive or operate heavy machinery while taking pain medication or muscle relaxer (valium) HOME CARE   Put ice on the injured area.   Put ice in a plastic bag.   Place a towel between your skin and the bag.   Leave the ice on for 15 to 20 minutes, 3 to 4 times a day.   Only take medicine as told by your doctor.   Keep all follow-up visits with your doctor.  GET HELP RIGHT AWAY IF:   Your medicine does not help your pain.   You have more puffiness (swelling) or your bruising gets worse rather than better.   You were unable to follow up as told by your doctor.   You have tingling or lose even more feeling in your arm, forearm, or hand.   Your arm is cold or pale.   You have more pain in the hand, forearm, or fingers.  MAKE SURE YOU:   Understand these instructions.   Will watch your condition.   Will get help right away if you are not doing well or get worse.  Document Released: 11/12/2009 Document Revised: 05/14/2011 Document Reviewed: 11/12/2009 [facility] Patient Information 2012 [place].

## 2011-11-30 NOTE — ED Notes (Signed)
Pt gets dialysis on Tues, Thurs, and Saturdays

## 2011-11-30 NOTE — ED Notes (Signed)
Pt discharged home with son, who is driving the patient. Pt a x 4

## 2012-01-19 ENCOUNTER — Encounter (HOSPITAL_COMMUNITY): Payer: Self-pay | Admitting: *Deleted

## 2012-01-19 DIAGNOSIS — Z7982 Long term (current) use of aspirin: Secondary | ICD-10-CM | POA: Insufficient documentation

## 2012-01-19 DIAGNOSIS — J45909 Unspecified asthma, uncomplicated: Secondary | ICD-10-CM | POA: Insufficient documentation

## 2012-01-19 DIAGNOSIS — R42 Dizziness and giddiness: Secondary | ICD-10-CM | POA: Insufficient documentation

## 2012-01-19 DIAGNOSIS — Z79899 Other long term (current) drug therapy: Secondary | ICD-10-CM | POA: Insufficient documentation

## 2012-01-19 DIAGNOSIS — E119 Type 2 diabetes mellitus without complications: Secondary | ICD-10-CM | POA: Insufficient documentation

## 2012-01-19 DIAGNOSIS — I10 Essential (primary) hypertension: Secondary | ICD-10-CM | POA: Insufficient documentation

## 2012-01-19 DIAGNOSIS — Z9089 Acquired absence of other organs: Secondary | ICD-10-CM | POA: Insufficient documentation

## 2012-01-19 DIAGNOSIS — R1110 Vomiting, unspecified: Secondary | ICD-10-CM | POA: Insufficient documentation

## 2012-01-19 LAB — CBC WITH DIFFERENTIAL/PLATELET
Basophils Relative: 1 % (ref 0–1)
Eosinophils Absolute: 0.8 10*3/uL — ABNORMAL HIGH (ref 0.0–0.7)
HCT: 35.4 % — ABNORMAL LOW (ref 36.0–46.0)
Hemoglobin: 12.1 g/dL (ref 12.0–15.0)
MCH: 35.4 pg — ABNORMAL HIGH (ref 26.0–34.0)
MCHC: 34.2 g/dL (ref 30.0–36.0)
Monocytes Absolute: 0.6 10*3/uL (ref 0.1–1.0)
Monocytes Relative: 7 % (ref 3–12)

## 2012-01-19 LAB — COMPREHENSIVE METABOLIC PANEL
ALT: 25 U/L (ref 0–35)
AST: 27 U/L (ref 0–37)
CO2: 29 mEq/L (ref 19–32)
Calcium: 10.7 mg/dL — ABNORMAL HIGH (ref 8.4–10.5)
Sodium: 136 mEq/L (ref 135–145)
Total Protein: 8.2 g/dL (ref 6.0–8.3)

## 2012-01-19 NOTE — ED Notes (Signed)
Pt is dialysis patient and states saw md in [place] and they said that her ears were full of fluid and was told to come here.  Pt here with dizziness that started on Saturday after dialysis and vomiting intermittent since Saturday.  Pt states that urinated on self today while at dialysis. Pale and weak

## 2012-01-20 ENCOUNTER — Emergency Department (HOSPITAL_COMMUNITY): Payer: Commercial Indemnity

## 2012-01-20 ENCOUNTER — Emergency Department (HOSPITAL_COMMUNITY)
Admission: EM | Admit: 2012-01-20 | Discharge: 2012-01-20 | Disposition: A | Discharge status: Home / Self Care | Payer: Commercial Indemnity | Attending: Emergency Medicine | Admitting: Emergency Medicine

## 2012-01-20 DIAGNOSIS — R42 Dizziness and giddiness: Secondary | ICD-10-CM

## 2012-01-20 MED ORDER — MECLIZINE HCL 12.5 MG PO TABS: 12.5000 mg | ORAL_TABLET | Freq: Three times a day (TID) | ORAL | Status: AC | PRN

## 2012-01-20 MED ORDER — MECLIZINE HCL 25 MG PO TABS
25.0000 mg | ORAL_TABLET | Freq: Once | ORAL | Status: AC
  Administered 2012-01-20: 25 mg via ORAL
  Filled 2012-01-20: qty 1

## 2012-01-20 NOTE — ED Notes (Signed)
Pt reports taking meclizine for dizziness which helped yesterday but reports only taking medications once.

## 2012-01-20 NOTE — ED Provider Notes (Addendum)
History     CSN: [id number]  Arrival date & time 01/19/12  1803   First MD Initiated Contact with Patient 01/20/12 0041      Chief Complaint  Patient presents with  . Dizziness  . Emesis    (Consider location/radiation/quality/duration/timing/severity/associated sxs/prior treatment) Patient is a 61 y.o. female presenting with vomiting. The history is provided by the patient.  Emesis  This is a new problem. The current episode started yesterday. The problem occurs 2 to 4 times per day. The problem has been resolved. The emesis has an appearance of stomach contents. There has been no fever.    Past Medical History  Diagnosis Date  . Renal disorder   . Hypertension   . Diabetes mellitus   . Asthma     Past Surgical History  Procedure Date  . Appendectomy   . Cholecystectomy   . Tubal ligation     No family history on file.  History  Substance Use Topics  . Smoking status: Never Smoker   . Smokeless tobacco: Not on file  . Alcohol Use: No    OB History    Grav Para Term Preterm Abortions TAB SAB Ect Mult Living                  Review of Systems  Gastrointestinal: Positive for vomiting.  Neurological: Positive for dizziness.  All other systems reviewed and are negative.    Allergies  Sulfa antibiotics; Amoxicillin; Ibuprofen; and Naldecon senior  Home Medications   Current Outpatient Rx  Name Route Sig Dispense Refill  . ASPIRIN EC 81 MG PO TBEC Oral Take 81 mg by mouth daily.    Marland Kitchen CALCIUM ACETATE 667 MG PO CAPS Oral Take 1,334-2,668 mg by mouth 2 (two) times daily. 4 caps with meals, 2 caps with snacks    . CARVEDILOL 25 MG PO TABS Oral Take 12.5 mg by mouth daily.     Marland Kitchen DOCUSATE SODIUM 100 MG PO CAPS Oral Take 100 mg by mouth 2 (two) times daily.    . ISOSORBIDE MONONITRATE ER 30 MG PO TB24 Oral Take 30 mg by mouth daily.    Marland Kitchen LEVOTHYROXINE SODIUM 50 MCG PO TABS Oral Take 50 mcg by mouth daily.    Marland Kitchen LOPERAMIDE HCL 2 MG PO CAPS Oral Take 2 mg by  mouth 4 (four) times daily as needed. For diarrhea    . MECLIZINE HCL 12.5 MG PO TABS Oral Take 12.5 mg by mouth 3 (three) times daily as needed. For vertigo    . RENA-VITE PO TABS Oral Take 1 tablet by mouth daily.    Marland Kitchen OMEPRAZOLE 20 MG PO CPDR Oral Take 20 mg by mouth daily.    Marland Kitchen ROSUVASTATIN CALCIUM 20 MG PO TABS Oral Take 20 mg by mouth every evening.     Marland Kitchen TRAMADOL HCL 50 MG PO TABS Oral Take 50 mg by mouth 2 (two) times daily as needed. For pain    . TRAZODONE HCL 50 MG PO TABS Oral Take 25 mg by mouth at bedtime.     Marland Kitchen ZINC 50 MG PO CAPS Oral Take 1 capsule by mouth daily.      BP 120/76  Pulse 76  Temp 98.3 F (36.8 C) (Oral)  Resp 18  SpO2 99%  Physical Exam  Constitutional: She is oriented to person, place, and time. She appears well-developed and well-nourished.  HENT:  Head: Normocephalic and atraumatic.  Eyes: Conjunctivae and EOM are normal. Pupils are equal, round,  and reactive to light.  Neck: Normal range of motion.  Cardiovascular: Normal rate, regular rhythm and normal heart sounds.   Pulmonary/Chest: Effort normal and breath sounds normal.  Abdominal: Soft. Bowel sounds are normal.  Musculoskeletal: Normal range of motion.       Graft to left arm with palpable thrill  Neurological: She is alert and oriented to person, place, and time.  Skin: Skin is warm and dry.  Psychiatric: She has a normal mood and affect. Her behavior is normal.    ED Course  Procedures (including critical care time)  Labs Reviewed  COMPREHENSIVE METABOLIC PANEL - Abnormal; Notable for the following:    Chloride 93 (*)     Glucose, Bld 132 (*)     BUN 32 (*)     Creatinine, Ser 4.32 (*)     Calcium 10.7 (*)     Albumin 3.3 (*)     Total Bilirubin 0.2 (*)     GFR calc non Af Amer 10 (*)     GFR calc Af Amer 12 (*)     All other components within normal limits  CBC WITH DIFFERENTIAL - Abnormal; Notable for the following:    RBC 3.42 (*)     HCT 35.4 (*)     MCV 103.5 (*)      MCH 35.4 (*)     Eosinophils Relative 10 (*)     Eosinophils Absolute 0.8 (*)     All other components within normal limits  URINALYSIS, ROUTINE W REFLEX MICROSCOPIC   No results found.   No diagnosis found.    MDM  + vertigo,  With hx of same.  Relieved with meclizine,  But worse today with emesis.  WIll ct head,  Basic labs,  Antiemetic,  reassess  Improved.  nad on head ct.  Will dc to fu with pmd,  Ret new/worsening sxs      Jakylah Bassinger Ferne Reus, MD 01/20/12 0153  Ishi Danser Ferne Reus, MD 01/20/12 [phone]

## 2012-01-20 NOTE — ED Notes (Signed)
Pt denies any pain, reports decrease in dizziness upon discharge. Verbalize understanding of follow up instructions.

## 2012-04-06 ENCOUNTER — Encounter (HOSPITAL_COMMUNITY): Payer: Self-pay | Admitting: *Deleted

## 2012-04-06 ENCOUNTER — Emergency Department (HOSPITAL_COMMUNITY)
Admission: EM | Admit: 2012-04-06 | Discharge: 2012-04-06 | Disposition: A | Discharge status: Home / Self Care | Payer: Medicare Other | Attending: Emergency Medicine | Admitting: Emergency Medicine

## 2012-04-06 DIAGNOSIS — J45909 Unspecified asthma, uncomplicated: Secondary | ICD-10-CM | POA: Insufficient documentation

## 2012-04-06 DIAGNOSIS — Z79899 Other long term (current) drug therapy: Secondary | ICD-10-CM | POA: Insufficient documentation

## 2012-04-06 DIAGNOSIS — R071 Chest pain on breathing: Secondary | ICD-10-CM | POA: Insufficient documentation

## 2012-04-06 DIAGNOSIS — E119 Type 2 diabetes mellitus without complications: Secondary | ICD-10-CM | POA: Insufficient documentation

## 2012-04-06 DIAGNOSIS — I10 Essential (primary) hypertension: Secondary | ICD-10-CM | POA: Insufficient documentation

## 2012-04-06 DIAGNOSIS — R0789 Other chest pain: Secondary | ICD-10-CM

## 2012-04-06 DIAGNOSIS — Z7982 Long term (current) use of aspirin: Secondary | ICD-10-CM | POA: Insufficient documentation

## 2012-04-06 MED ORDER — MORPHINE SULFATE 4 MG/ML IJ SOLN: 4.0000 mg | Freq: Once | INTRAMUSCULAR | Status: DC

## 2012-04-06 MED ORDER — OXYCODONE-ACETAMINOPHEN 5-325 MG PO TABS: 1.0000 | ORAL_TABLET | Freq: Four times a day (QID) | ORAL | Status: DC | PRN

## 2012-04-06 NOTE — ED Notes (Signed)
MD at bedside. 

## 2012-04-06 NOTE — ED Provider Notes (Signed)
History     CSN: [id number]  Arrival date & time 04/06/12  1752   First MD Initiated Contact with Patient 04/06/12 1753      Chief Complaint  Patient presents with  . Chest Pain    (Consider location/radiation/quality/duration/timing/severity/associated sxs/prior treatment) HPI Comments: At 4:40 PM the patient was showering and noticed a sudden onset sharp pain between her left upper chest and her left shoulder. This continued for an hour and she took tramadol which brought her pain from a 6/10 to a 1/10. She denies any other associated symptoms. She states that the pain radiates from her left upper chest wall down her left upper arm along her left AV fistula. She had a fistula done in 2008 and has had no complications with it since then. It was accessed yesterday for hemodialysis.  Patient is a 61 y.o. female presenting with chest pain. The history is provided by the patient. No language interpreter was used.  Chest Pain The chest pain began 1 - 2 hours ago. Duration of episode(s) is 2 hours. Chest pain occurs constantly. The chest pain is improving. At its most intense, the pain is at 6/10. The pain is currently at 1/10. The severity of the pain is mild. The quality of the pain is described as sharp. The pain does not radiate. Pertinent negatives for primary symptoms include no fever, no shortness of breath, no cough, no wheezing, no abdominal pain, no nausea and no vomiting.  Pertinent negatives for associated symptoms include no claudication, no diaphoresis, no lower extremity edema, no near-syncope and no weakness. Treatments tried: tramadol. Risk factors include lack of exercise and obesity.  Her past medical history is significant for diabetes and hypertension.  Pertinent negatives for past medical history include no CAD and no MI.     Past Medical History  Diagnosis Date  . Renal disorder   . Hypertension   . Diabetes mellitus   . Asthma     Past Surgical History    Procedure Date  . Appendectomy   . Cholecystectomy   . Tubal ligation     History reviewed. No pertinent family history.  History  Substance Use Topics  . Smoking status: Never Smoker   . Smokeless tobacco: Not on file  . Alcohol Use: No    OB History    Grav Para Term Preterm Abortions TAB SAB Ect Mult Living                  Review of Systems  Constitutional: Negative for fever, chills, diaphoresis, activity change and appetite change.  HENT: Negative for congestion, rhinorrhea, neck pain, neck stiffness and sinus pressure.   Eyes: Negative for discharge and visual disturbance.  Respiratory: Negative for cough, chest tightness, shortness of breath, wheezing and stridor.   Cardiovascular: Positive for chest pain (L upper lateral). Negative for claudication, leg swelling and near-syncope.  Gastrointestinal: Negative for nausea, vomiting, abdominal pain, diarrhea and abdominal distention.  Genitourinary: Negative for decreased urine volume and difficulty urinating.  Musculoskeletal: Positive for arthralgias (L antrerior shoulder). Negative for back pain.  Skin: Negative for color change and pallor.  Neurological: Negative for weakness, light-headedness and headaches.  Psychiatric/Behavioral: Negative for behavioral problems and agitation.  All other systems reviewed and are negative.    Allergies  Sulfa antibiotics; Amoxicillin; Ibuprofen; and Naldecon senior  Home Medications   Current Outpatient Rx  Name Route Sig Dispense Refill  . ASPIRIN EC 81 MG PO TBEC Oral Take 81 mg by mouth  daily.    Marland Kitchen CALCIUM ACETATE 667 MG PO CAPS Oral Take 1,334-2,668 mg by mouth 2 (two) times daily. 4 caps with meals, 2 caps with snacks    . CARVEDILOL 25 MG PO TABS Oral Take 12.5 mg by mouth daily.     Marland Kitchen DOCUSATE SODIUM 100 MG PO CAPS Oral Take 100 mg by mouth 2 (two) times daily.    . ISOSORBIDE MONONITRATE ER 30 MG PO TB24 Oral Take 30 mg by mouth daily.    Marland Kitchen LEVOTHYROXINE SODIUM 50  MCG PO TABS Oral Take 50 mcg by mouth daily.    Marland Kitchen LOPERAMIDE HCL 2 MG PO CAPS Oral Take 2 mg by mouth 4 (four) times daily as needed. For diarrhea    . MECLIZINE HCL 12.5 MG PO TABS Oral Take 12.5 mg by mouth 3 (three) times daily as needed. For vertigo    . RENA-VITE PO TABS Oral Take 1 tablet by mouth daily.    Marland Kitchen OMEPRAZOLE 20 MG PO CPDR Oral Take 20 mg by mouth daily.    Marland Kitchen ROSUVASTATIN CALCIUM 20 MG PO TABS Oral Take 20 mg by mouth every evening.     Marland Kitchen TRAMADOL HCL 50 MG PO TABS Oral Take 50 mg by mouth 2 (two) times daily as needed. For pain    . TRAZODONE HCL 50 MG PO TABS Oral Take 25 mg by mouth at bedtime.     Marland Kitchen ZINC 50 MG PO CAPS Oral Take 1 capsule by mouth daily.      BP 137/72  Pulse 86  Temp 98.4 F (36.9 C) (Oral)  Resp 16  SpO2 98%  Physical Exam  Nursing note and vitals reviewed. Constitutional: She is oriented to person, place, and time. She appears well-developed and well-nourished. No distress.  HENT:  Head: Normocephalic and atraumatic.  Mouth/Throat: No oropharyngeal exudate.  Eyes: EOM are normal. Pupils are equal, round, and reactive to light. Right eye exhibits no discharge. Left eye exhibits no discharge.  Neck: Normal range of motion. Neck supple. No JVD present.  Cardiovascular: Normal rate, regular rhythm and normal heart sounds.   Pulmonary/Chest: Effort normal and breath sounds normal. No stridor. No respiratory distress. She exhibits tenderness (palpable cord of L anterior upper lateral chest, runs over anterior L shoulder near AVF. no erythema, fluctuance. FROM L shoulder.).  Abdominal: Soft. Bowel sounds are normal. She exhibits no distension. There is no tenderness. There is no guarding.  Musculoskeletal: Normal range of motion. She exhibits no edema and no tenderness.  Neurological: She is alert and oriented to person, place, and time. No cranial nerve deficit. She exhibits normal muscle tone.  Skin: Skin is warm and dry. No rash noted. She is not  diaphoretic.  Psychiatric: She has a normal mood and affect. Her behavior is normal. Judgment and thought content normal.    ED Course  Procedures (including critical care time)  Labs Reviewed - No data to display No results found.   1. Left-sided chest wall pain       Date: 04/07/2012  Rate: 83  Rhythm: normal sinus rhythm  QRS Axis: normal  Intervals: normal  ST/T Wave abnormalities: normal  Conduction Disutrbances: none  Narrative Interpretation: nml  Old EKG Reviewed: No significant changes noted    MDM  The patient has a palpable tender area over her left upper chest and left anterior shoulder. She states it also radiates to her AV fistula on her left upper extremity. I think her pain is likely  related to her she fistula or a nearby collateral vein. I think this would best be managed conservatively with anti-inflammatory pain medicine and close followup. She will followup at her dialysis clinic tomorrow morning. sHe did have a palpable thrill over her fistula. There was no skin changes concerning for cellulitis or abscess. She denies any shortness of breath or any pleuritic component to her pain, I doubt pulmonary embolism as the cause of her pain. I doubt acs as a cause for her pain. Doubt pneumonia, trauma, pneumothorax, aortic dissection, or fracture. Pt deemed stable for discharge. Return precautions were provided and pt expressed understanding to return to ED if any acute symptoms return. Follow up was instructed which pt also expressed understanding. All questions were answered and pt was in agreement w/ plan.         Verdie Shire, MD 04/07/12 0122  Verdie Shire, MD 04/07/12 [phone]

## 2012-04-06 NOTE — ED Notes (Signed)
Patient states "I think its my fistula, that's where it hurts when I push on it".  Took tramadol prior to EMS arrival

## 2012-04-07 NOTE — ED Provider Notes (Signed)
I saw and evaluated the patient, reviewed the resident's note and I agree with the findings and plan.   Date: 04/07/2012  Rate: 83  Rhythm: normal sinus rhythm  QRS Axis: normal  Intervals: normal  ST/T Wave abnormalities: normal  Conduction Disutrbances: none  Narrative Interpretation:   Old EKG Reviewed: No significant changes noted     Hoy Morn, MD 04/07/12 0134

## 2012-05-17 ENCOUNTER — Other Ambulatory Visit: Payer: Self-pay | Admitting: *Deleted

## 2012-05-17 DIAGNOSIS — T82598A Other mechanical complication of other cardiac and vascular devices and implants, initial encounter: Secondary | ICD-10-CM

## 2012-06-09 ENCOUNTER — Encounter: Payer: Self-pay | Admitting: Surgery

## 2012-06-10 ENCOUNTER — Ambulatory Visit: Payer: Managed Care, Other (non HMO) | Admitting: Surgery

## 2012-07-15 ENCOUNTER — Encounter: Payer: Self-pay | Admitting: Surgery

## 2012-07-18 ENCOUNTER — Encounter: Payer: Self-pay | Admitting: Surgery

## 2012-07-18 ENCOUNTER — Encounter (INDEPENDENT_AMBULATORY_CARE_PROVIDER_SITE_OTHER): Payer: Medicare Other | Admitting: *Deleted

## 2012-07-18 ENCOUNTER — Ambulatory Visit (INDEPENDENT_AMBULATORY_CARE_PROVIDER_SITE_OTHER): Payer: Medicare Other | Admitting: Surgery

## 2012-07-18 VITALS — BP 140/75 | HR 60 | Ht <= 58 in | Wt 223.5 lb

## 2012-07-18 DIAGNOSIS — N186 End stage renal disease: Secondary | ICD-10-CM

## 2012-07-18 DIAGNOSIS — T82598A Other mechanical complication of other cardiac and vascular devices and implants, initial encounter: Secondary | ICD-10-CM

## 2012-07-18 DIAGNOSIS — T82898A Other specified complication of vascular prosthetic devices, implants and grafts, initial encounter: Secondary | ICD-10-CM

## 2012-07-18 NOTE — Progress Notes (Signed)
[facility]   Patient name: Roberta Bryant MRN: [id number] DOB: [birth year] Sex: female     Chief Complaint  Patient presents with  . Re-evaluation    access arm pain and swelling/ Dr. Posey Pronto -pt c/o tightness in left arm  after dialysis - HD TTS    HISTORY OF PRESENT ILLNESS: The patient is here today for evaluation of her fistula. She complains that she has a pressure in her fistula after dialysis. She denies having any swelling in her arm. She does not have any issues with her dialysis runs. Her fistula was created in 2008. She had revision of her fistula in 2011 which required complete mobilization of the cephalic vein as well as re-tunneling and re-anastomosing.  Past Medical History  Diagnosis Date  . Renal disorder   . Hypertension   . Diabetes mellitus   . Asthma   . Anemia   . Myocardial infarction     Past Surgical History  Procedure Laterality Date  . Appendectomy    . Cholecystectomy    . Tubal ligation    . Hernia repair      X2  . Av fistula placement Left 12/03/2006  . Av fistula repair Left 11/25/2009  . Insertion of dialysis catheter  2010    History   Social History  . Marital Status: Divorced    Spouse Name: N/A    Number of Children: N/A  . Years of Education: N/A   Occupational History  . Not on file.   Social History Main Topics  . Smoking status: Never Smoker   . Smokeless tobacco: Not on file  . Alcohol Use: No  . Drug Use: No  . Sexually Active: Not on file   Other Topics Concern  . Not on file   Social History Narrative  . No narrative on file    History reviewed. No pertinent family history.  Allergies as of 07/18/2012 - Review Complete 07/18/2012  Allergen Reaction Noted  . Sulfa antibiotics Anaphylaxis 06/29/2011  . Amoxicillin Hives 06/29/2011  . Ibuprofen Hives 06/29/2011  . Naldecon senior (guaifenesin) Hives 06/29/2011    Current Outpatient Prescriptions on File Prior to Visit   Medication Sig Dispense Refill  . aspirin EC 81 MG tablet Take 81 mg by mouth daily.      . B Complex-C (SUPER B COMPLEX PO) Take 1 tablet by mouth daily.      . calcium acetate (PHOSLO) 667 MG capsule Take 1,334-2,668 mg by mouth 2 (two) times daily. 4 caps with meals, 2 caps with snacks      . carvedilol (COREG) 25 MG tablet Take 12.5 mg by mouth daily.       Marland Kitchen docusate sodium (COLACE) 100 MG capsule Take 100 mg by mouth 2 (two) times daily.      Marland Kitchen levothyroxine (SYNTHROID, LEVOTHROID) 50 MCG tablet Take 50 mcg by mouth daily.      Marland Kitchen loperamide (IMODIUM) 2 MG capsule Take 2 mg by mouth 4 (four) times daily as needed. For diarrhea      . meclizine (ANTIVERT) 12.5 MG tablet Take 12.5 mg by mouth 3 (three) times daily as needed. For vertigo      . multivitamin (RENA-VIT) TABS tablet Take 1 tablet by mouth daily.      Marland Kitchen omega-3 acid ethyl esters (LOVAZA) 1 G capsule Take 2 g by mouth 4 (four) times daily.      Marland Kitchen omeprazole (PRILOSEC) 20 MG capsule Take 20 mg  by mouth daily.      Marland Kitchen oxyCODONE-acetaminophen (PERCOCET/ROXICET) 5-325 MG per tablet Take 1 tablet by mouth every 6 (six) hours as needed for pain.  8 tablet  0  . promethazine (PHENERGAN) 12.5 MG tablet Take 12.5 mg by mouth 3 (three) times daily as needed. For nausea and vomiting      . rosuvastatin (CRESTOR) 20 MG tablet Take 20 mg by mouth every evening.       . traMADol (ULTRAM) 50 MG tablet Take 50 mg by mouth 2 (two) times daily as needed. For pain      . traZODone (DESYREL) 50 MG tablet Take 25 mg by mouth at bedtime.       . Zinc 50 MG CAPS Take 1 capsule by mouth daily.      . isosorbide mononitrate (IMDUR) 30 MG 24 hr tablet Take 30 mg by mouth daily.       No current facility-administered medications on file prior to visit.     REVIEW OF SYSTEMS: Cardiovascular: No chest pain, chest pressure, palpitations, orthopnea, or dyspnea on exertion. No claudication or rest pain,  No history of DVT or phlebitis. Pulmonary: No  productive cough, asthma or wheezing. Neurologic: No weakness, paresthesias, aphasia, or amaurosis. No dizziness. Hematologic: No bleeding problems or clotting disorders. Musculoskeletal: No joint pain or joint swelling. Gastrointestinal: No blood in stool or hematemesis Genitourinary: No dysuria or hematuria. Psychiatric:: No history of major depression. Integumentary: No rashes or ulcers. Constitutional: No fever or chills.  PHYSICAL EXAMINATION:   Vital signs are BP 140/75  Pulse 60  Ht 4\' 10"  (1.473 m)  Wt 223 lb 8 oz (101.379 kg)  BMI 46.72 kg/m2  SpO2 99% General: The patient appears their stated age. HEENT:  No gross abnormalities Pulmonary:  Non labored breathing Musculoskeletal: There are no major deformities. Neurologic: No focal weakness or paresthesias are detected, Skin: There are no ulcer or rashes noted. Psychiatric: The patient has normal affect. Cardiovascular: The fistula is dilated near the arterial anastomosis. There is a good thrill. There has been no thinning of the skin. No edema is seen in the left arm   Diagnostic Studies Duplex of her fistula was performed. This shows a widely patent fistula. There is a velocity elevation in the cephalic subclavian junction.  Assessment: End-stage renal disease Plan: The patient's fistula has become somewhat ectatic. There is excellent thrill within the fistula. She is not having any issues with her flow rates while at dialysis. She does have an area of a velocity elevation in the proximal cephalic vein, however without evidence that this is clinically significant I would be reluctant to recommend and intervention. However, if she does have problems with flow rates while on dialysis, or if she has issues with bleeding after her dialysis runs, I would consider a fistulogram. The patient does not have issues with either of these, and therefore I think expectant management would be our best course of action  V. Leia Alf, M.D. [facility] Office: [phone] Pager:  [phone]

## 2012-09-06 ENCOUNTER — Encounter (HOSPITAL_COMMUNITY): Payer: Self-pay | Admitting: *Deleted

## 2012-09-06 ENCOUNTER — Emergency Department (HOSPITAL_COMMUNITY)
Admission: EM | Admit: 2012-09-06 | Discharge: 2012-09-06 | Disposition: A | Discharge status: Home / Self Care | Payer: Medicare Other | Attending: Emergency Medicine | Admitting: Emergency Medicine

## 2012-09-06 DIAGNOSIS — I10 Essential (primary) hypertension: Secondary | ICD-10-CM | POA: Insufficient documentation

## 2012-09-06 DIAGNOSIS — J45909 Unspecified asthma, uncomplicated: Secondary | ICD-10-CM | POA: Insufficient documentation

## 2012-09-06 DIAGNOSIS — G8929 Other chronic pain: Secondary | ICD-10-CM | POA: Insufficient documentation

## 2012-09-06 DIAGNOSIS — M549 Dorsalgia, unspecified: Secondary | ICD-10-CM

## 2012-09-06 DIAGNOSIS — Z992 Dependence on renal dialysis: Secondary | ICD-10-CM | POA: Insufficient documentation

## 2012-09-06 DIAGNOSIS — Z7982 Long term (current) use of aspirin: Secondary | ICD-10-CM | POA: Insufficient documentation

## 2012-09-06 DIAGNOSIS — N289 Disorder of kidney and ureter, unspecified: Secondary | ICD-10-CM | POA: Insufficient documentation

## 2012-09-06 DIAGNOSIS — Z862 Personal history of diseases of the blood and blood-forming organs and certain disorders involving the immune mechanism: Secondary | ICD-10-CM | POA: Insufficient documentation

## 2012-09-06 DIAGNOSIS — M353 Polymyalgia rheumatica: Secondary | ICD-10-CM | POA: Insufficient documentation

## 2012-09-06 DIAGNOSIS — Z79899 Other long term (current) drug therapy: Secondary | ICD-10-CM | POA: Insufficient documentation

## 2012-09-06 DIAGNOSIS — I252 Old myocardial infarction: Secondary | ICD-10-CM | POA: Insufficient documentation

## 2012-09-06 DIAGNOSIS — E119 Type 2 diabetes mellitus without complications: Secondary | ICD-10-CM | POA: Insufficient documentation

## 2012-09-06 LAB — GLUCOSE, CAPILLARY

## 2012-09-06 LAB — POCT I-STAT, CHEM 8
Chloride: 103 mEq/L (ref 96–112)
Creatinine, Ser: 5.1 mg/dL — ABNORMAL HIGH (ref 0.50–1.10)
Glucose, Bld: 95 mg/dL (ref 70–99)
HCT: 36 % (ref 36.0–46.0)
Potassium: 4.1 mEq/L (ref 3.5–5.1)

## 2012-09-06 MED ORDER — OXYCODONE-ACETAMINOPHEN 5-325 MG PO TABS: 1.0000 | ORAL_TABLET | Freq: Four times a day (QID) | ORAL | Status: DC | PRN

## 2012-09-06 MED ORDER — SODIUM CHLORIDE 0.9 % IV BOLUS (SEPSIS)
500.0000 mL | Freq: Once | INTRAVENOUS | Status: AC
  Administered 2012-09-06: 500 mL via INTRAVENOUS

## 2012-09-06 MED ORDER — MORPHINE SULFATE 4 MG/ML IJ SOLN: 4.0000 mg | Freq: Once | INTRAMUSCULAR | Status: DC

## 2012-09-06 MED ORDER — SODIUM CHLORIDE 0.9 % IV BOLUS (SEPSIS)
1000.0000 mL | Freq: Once | INTRAVENOUS | Status: DC
  Administered 2012-09-06: 1000 mL via INTRAVENOUS

## 2012-09-06 MED ORDER — OXYCODONE-ACETAMINOPHEN 5-325 MG PO TABS
1.0000 | ORAL_TABLET | Freq: Once | ORAL | Status: AC
  Administered 2012-09-06: 1 via ORAL
  Filled 2012-09-06: qty 1

## 2012-09-06 NOTE — ED Notes (Signed)
Pt brought to ED via POV from dialysis. States she finished her full treatment, but after became very shaky and weak. Aaox3, skin w/d, resp e/u. C/o chronic lower back pain. Pt still urinates.

## 2012-09-06 NOTE — ED Provider Notes (Signed)
4:55 PM pt was signed out to me pending CK result which was normal. I have personally discussed all results with patient at the bedside.  Discharge papers and prescriptions had been written and signed by PA.    Threasa Beards, MD 09/06/12 [phone]

## 2012-09-06 NOTE — ED Notes (Signed)
Meal ordered for pt 

## 2012-09-06 NOTE — ED Notes (Signed)
Pt alert and oriented, pt complains of pain in lower back and buttock pain, sts she has been here numerous times for the same thing and all we do is give her percocet and a shot and send her home and she knows that something is wrong.
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
## 2012-09-06 NOTE — ED Notes (Signed)
Pt just stated she remembered that all this started when her doctor changed her to simvastatin and lexapro.

## 2012-09-07 NOTE — ED Provider Notes (Signed)
Medical screening examination/treatment/procedure(s) were conducted as a shared visit with non-physician practitioner(s) and myself.  I personally evaluated the patient during the encounter   Dot Lanes, MD 09/07/12 [phone]

## 2012-10-21 HISTORY — PX: ESOPHAGOGASTRODUODENOSCOPY: SHX1529

## 2013-01-30 ENCOUNTER — Encounter (HOSPITAL_COMMUNITY): Payer: Self-pay | Admitting: *Deleted

## 2013-01-30 ENCOUNTER — Emergency Department (HOSPITAL_COMMUNITY): Payer: Medicare Other

## 2013-01-30 ENCOUNTER — Emergency Department (HOSPITAL_COMMUNITY)
Admission: EM | Admit: 2013-01-30 | Discharge: 2013-01-30 | Disposition: A | Discharge status: Home / Self Care | Payer: Medicare Other | Attending: Emergency Medicine | Admitting: Emergency Medicine

## 2013-01-30 DIAGNOSIS — K2970 Gastritis, unspecified, without bleeding: Secondary | ICD-10-CM | POA: Insufficient documentation

## 2013-01-30 DIAGNOSIS — J45909 Unspecified asthma, uncomplicated: Secondary | ICD-10-CM | POA: Insufficient documentation

## 2013-01-30 DIAGNOSIS — E119 Type 2 diabetes mellitus without complications: Secondary | ICD-10-CM | POA: Insufficient documentation

## 2013-01-30 DIAGNOSIS — Z79899 Other long term (current) drug therapy: Secondary | ICD-10-CM | POA: Insufficient documentation

## 2013-01-30 DIAGNOSIS — D649 Anemia, unspecified: Secondary | ICD-10-CM | POA: Insufficient documentation

## 2013-01-30 DIAGNOSIS — I10 Essential (primary) hypertension: Secondary | ICD-10-CM | POA: Insufficient documentation

## 2013-01-30 DIAGNOSIS — R0789 Other chest pain: Secondary | ICD-10-CM | POA: Insufficient documentation

## 2013-01-30 DIAGNOSIS — Z87448 Personal history of other diseases of urinary system: Secondary | ICD-10-CM | POA: Insufficient documentation

## 2013-01-30 DIAGNOSIS — I252 Old myocardial infarction: Secondary | ICD-10-CM | POA: Insufficient documentation

## 2013-01-30 DIAGNOSIS — R109 Unspecified abdominal pain: Secondary | ICD-10-CM | POA: Insufficient documentation

## 2013-01-30 DIAGNOSIS — IMO0001 Reserved for inherently not codable concepts without codable children: Secondary | ICD-10-CM | POA: Insufficient documentation

## 2013-01-30 DIAGNOSIS — Z7982 Long term (current) use of aspirin: Secondary | ICD-10-CM | POA: Insufficient documentation

## 2013-01-30 LAB — CBC WITH DIFFERENTIAL/PLATELET
Basophils Absolute: 0 10*3/uL (ref 0.0–0.1)
Basophils Relative: 1 % (ref 0–1)
Eosinophils Absolute: 1 10*3/uL — ABNORMAL HIGH (ref 0.0–0.7)
Lymphs Abs: 2.1 10*3/uL (ref 0.7–4.0)
MCH: 36 pg — ABNORMAL HIGH (ref 26.0–34.0)
MCHC: 35.2 g/dL (ref 30.0–36.0)
Neutrophils Relative %: 56 % (ref 43–77)
Platelets: 210 10*3/uL (ref 150–400)
RBC: 3.08 MIL/uL — ABNORMAL LOW (ref 3.87–5.11)
RDW: 12.8 % (ref 11.5–15.5)

## 2013-01-30 LAB — POCT I-STAT TROPONIN I: Troponin i, poc: 0.04 ng/mL (ref 0.00–0.08)

## 2013-01-30 LAB — COMPREHENSIVE METABOLIC PANEL
AST: 21 U/L (ref 0–37)
Alkaline Phosphatase: 83 U/L (ref 39–117)
CO2: 26 mEq/L (ref 19–32)
Chloride: 95 mEq/L — ABNORMAL LOW (ref 96–112)
Creatinine, Ser: 6.4 mg/dL — ABNORMAL HIGH (ref 0.50–1.10)
GFR calc non Af Amer: 6 mL/min — ABNORMAL LOW (ref >90)
Total Bilirubin: 0.2 mg/dL — ABNORMAL LOW (ref 0.3–1.2)

## 2013-01-30 LAB — LIPASE, BLOOD: Lipase: 27 U/L (ref 11–59)

## 2013-01-30 NOTE — ED Notes (Addendum)
Pt was sitting down and began experiencing substernal chest pain that radiated to the L chest. Pain is intermittant.  Hx of mi and chest wall pain.  Pt states feels more like chest wall pain.  Emesis off-an-on x 3 weeks. Also c/o intermittent abd pain.  Hx dialysis T-H-S.

## 2013-01-30 NOTE — ED Notes (Signed)
Discharge instructions reviewed. Pt verbalized understanding.  

## 2013-01-30 NOTE — ED Notes (Signed)
Pt c/o intermittent sternal chest pain that radiates to left side of chest and back. Pt states no activity brought pain on, she was working on a puzzles when suddenly the pain happened. Pt denies any n/v, diaphoresis, or sob with pain. Pt states she has 0/10 pain at this time. No distress noted.

## 2013-01-30 NOTE — ED Provider Notes (Signed)
CSN: [id number]     Arrival date & time 01/30/13  1535 History   First MD Initiated Contact with Patient 01/30/13 1625     Chief Complaint  Patient presents with  . Chest Pain   (Consider location/radiation/quality/duration/timing/severity/associated sxs/prior Treatment) HPI Pt with several days of epigastric pain and vomiting began having L parasternal pain this afternoon while sitting doing crossword puzzle. Pain started at 1400 and lasted approx 1 hour. Pain is now resolved. Pt states she has been told by her cardiologist that this pain which she has had frequently in the past is muscular pain and not her heart. Pain is worse with palpation and movement. No SOB, cough. No history of trauma or lower ext swelling. No fever or chills. Pt denies melena or blood in vomit. Denies taking NSAID's.  Past Medical History  Diagnosis Date  . Renal disorder   . Hypertension   . Diabetes mellitus   . Asthma   . Anemia   . Myocardial infarction    Past Surgical History  Procedure Laterality Date  . Appendectomy    . Cholecystectomy    . Tubal ligation    . Hernia repair      X2  . Av fistula placement Left 12/03/2006  . Av fistula repair Left 11/25/2009  . Insertion of dialysis catheter  2010   No family history on file. History  Substance Use Topics  . Smoking status: Never Smoker   . Smokeless tobacco: Not on file  . Alcohol Use: No   OB History   Grav Para Term Preterm Abortions TAB SAB Ect Mult Living                 Review of Systems  Constitutional: Negative for fever and chills.  Respiratory: Negative for cough and shortness of breath.   Cardiovascular: Positive for chest pain. Negative for palpitations and leg swelling.  Gastrointestinal: Positive for nausea, vomiting and abdominal pain. Negative for diarrhea, constipation and blood in stool.  Musculoskeletal: Positive for myalgias. Negative for back pain.  Skin: Negative for rash and wound.  Neurological: Negative for  dizziness, weakness, light-headedness, numbness and headaches.  All other systems reviewed and are negative.    Allergies  Sulfa antibiotics; Amoxicillin; Ibuprofen; and Naldecon senior  Home Medications   Current Outpatient Rx  Name  Route  Sig  Dispense  Refill  . acetaminophen (TYLENOL) 500 MG tablet   Oral   Take 500 mg by mouth every 6 (six) hours as needed for pain.         Marland Kitchen aspirin EC 81 MG tablet   Oral   Take 81 mg by mouth daily.         . calcium acetate (PHOSLO) 667 MG capsule   Oral   Take 1,334 mg by mouth 4 (four) times daily -  with meals and at bedtime. 2 capsules with meals and 2 capsules with snacks         . carvedilol (COREG) 25 MG tablet   Oral   Take 12.5 mg by mouth at bedtime.          . dicyclomine (BENTYL) 10 MG capsule   Oral   Take 10 mg by mouth 2 (two) times daily.         Marland Kitchen docusate sodium (COLACE) 100 MG capsule   Oral   Take 100 mg by mouth 2 (two) times daily.         . folic acid-vitamin b complex-vitamin c-selenium-zinc (DIALYVITE)  3 MG TABS tablet   Oral   Take 1 tablet by mouth daily.         . isosorbide mononitrate (IMDUR) 30 MG 24 hr tablet   Oral   Take 30 mg by mouth daily.         Marland Kitchen levothyroxine (SYNTHROID, LEVOTHROID) 50 MCG tablet   Oral   Take 50 mcg by mouth daily.         Marland Kitchen loperamide (IMODIUM A-D) 2 MG tablet   Oral   Take 2 mg by mouth 4 (four) times daily as needed for diarrhea or loose stools.         . meclizine (ANTIVERT) 12.5 MG tablet   Oral   Take 12.5 mg by mouth 3 (three) times daily as needed. For vertigo         . methocarbamol (ROBAXIN) 500 MG tablet   Oral   Take 500 mg by mouth 3 (three) times daily as needed (muscle spams).         . Multiple Vitamins-Minerals (HAIR/SKIN/NAILS/BIOTIN PO)   Oral   Take 1 capsule by mouth daily.         Marland Kitchen omeprazole (PRILOSEC OTC) 20 MG tablet   Oral   Take 20 mg by mouth daily.         Marland Kitchen oxyCODONE-acetaminophen  (PERCOCET/ROXICET) 5-325 MG per tablet   Oral   Take 1 tablet by mouth every 6 (six) hours as needed for pain.   20 tablet   0   . prochlorperazine (COMPAZINE) 5 MG tablet   Oral   Take 5 mg by mouth 3 (three) times daily as needed for nausea.         . promethazine (PHENERGAN) 12.5 MG tablet   Oral   Take 12.5 mg by mouth 3 (three) times daily as needed. For nausea and vomiting         . traMADol (ULTRAM) 50 MG tablet   Oral   Take 50 mg by mouth 2 (two) times daily as needed. For pain         . traZODone (DESYREL) 50 MG tablet   Oral   Take 25 mg by mouth at bedtime as needed for sleep.          Marland Kitchen Zinc 50 MG CAPS   Oral   Take 1 capsule by mouth daily.          BP 138/114  Pulse 66  Temp(Src) 98.7 F (37.1 C) (Oral)  Resp 14  SpO2 98% Physical Exam  Nursing note and vitals reviewed. Constitutional: She is oriented to person, place, and time. She appears well-developed and well-nourished. No distress.  HENT:  Head: Normocephalic and atraumatic.  Mouth/Throat: Oropharynx is clear and moist.  Eyes: EOM are normal. Pupils are equal, round, and reactive to light.  Neck: Normal range of motion. Neck supple.  Cardiovascular: Normal rate and regular rhythm.   Pulmonary/Chest: Effort normal and breath sounds normal. No respiratory distress. She has no wheezes. She has no rales. She exhibits tenderness (reproduced Chest wall tenderness with palpation of L parasternal region. ).  Abdominal: Soft. Bowel sounds are normal. She exhibits no distension and no mass. There is tenderness (Epigastric and LUQ TTP. ). There is no rebound and no guarding.  Musculoskeletal: Normal range of motion. She exhibits no edema and no tenderness.  No calf swelling or tenderness. L sided HD shunt with palp thrill.   Neurological: She is alert and oriented to person, place,  and time.  No motor deficit, sensation intact  Skin: Skin is warm and dry. No rash noted. No erythema.  Psychiatric:  She has a normal mood and affect. Her behavior is normal.    ED Course  Procedures (including critical care time) Labs Review Labs Reviewed  COMPREHENSIVE METABOLIC PANEL - Abnormal; Notable for the following:    Sodium 134 (*)    Chloride 95 (*)    Glucose, Bld 137 (*)    BUN 45 (*)    Creatinine, Ser 6.40 (*)    Total Bilirubin 0.2 (*)    GFR calc non Af Amer 6 (*)    GFR calc Af Amer 7 (*)    All other components within normal limits  CBC WITH DIFFERENTIAL - Abnormal; Notable for the following:    RBC 3.08 (*)    Hemoglobin 11.1 (*)    HCT 31.5 (*)    MCV 102.3 (*)    MCH 36.0 (*)    Eosinophils Relative 12 (*)    Eosinophils Absolute 1.0 (*)    All other components within normal limits  LIPASE, BLOOD  TROPONIN I  POCT I-STAT TROPONIN I   Imaging Review Dg Chest 2 View  01/30/2013   *RADIOLOGY REPORT*  Clinical Data: Chest pain  CHEST - 2 VIEW  Comparison: 12/15/2010  Findings: The heart and pulmonary vascularity are within normal limits.  The lungs are well-aerated bilaterally.  Old rib fractures again noted on the left.  IMPRESSION: No acute abnormalities seen.   Original Report Authenticated By: Inez Catalina, M.D.    Date: 01/30/2013  Rate: 82  Rhythm: normal sinus rhythm  QRS Axis: normal  Intervals: normal  ST/T Wave abnormalities: normal  Conduction Disutrbances:none  Narrative Interpretation:   Old EKG Reviewed: unchanged   MDM  Chest pain is likely musculoskeletal in origin. Troponin x2 and EKG are normal. clinical picture is complicated by gastritis. Advised followup with gastroenterology. Return precautions given.    Julianne Rice, MD 02/01/13 [phone]

## 2013-04-05 ENCOUNTER — Emergency Department (HOSPITAL_COMMUNITY): Payer: Medicare Other

## 2013-04-05 ENCOUNTER — Emergency Department (HOSPITAL_COMMUNITY)
Admission: EM | Admit: 2013-04-05 | Discharge: 2013-04-05 | Disposition: A | Discharge status: Home / Self Care | Payer: Medicare Other | Attending: Emergency Medicine | Admitting: Emergency Medicine

## 2013-04-05 ENCOUNTER — Encounter (HOSPITAL_COMMUNITY): Payer: Self-pay | Admitting: Emergency Medicine

## 2013-04-05 DIAGNOSIS — Z992 Dependence on renal dialysis: Secondary | ICD-10-CM | POA: Insufficient documentation

## 2013-04-05 DIAGNOSIS — N186 End stage renal disease: Secondary | ICD-10-CM | POA: Insufficient documentation

## 2013-04-05 DIAGNOSIS — Z880 Allergy status to penicillin: Secondary | ICD-10-CM | POA: Insufficient documentation

## 2013-04-05 DIAGNOSIS — E119 Type 2 diabetes mellitus without complications: Secondary | ICD-10-CM | POA: Insufficient documentation

## 2013-04-05 DIAGNOSIS — I120 Hypertensive chronic kidney disease with stage 5 chronic kidney disease or end stage renal disease: Secondary | ICD-10-CM | POA: Insufficient documentation

## 2013-04-05 DIAGNOSIS — J45909 Unspecified asthma, uncomplicated: Secondary | ICD-10-CM | POA: Insufficient documentation

## 2013-04-05 DIAGNOSIS — E669 Obesity, unspecified: Secondary | ICD-10-CM | POA: Insufficient documentation

## 2013-04-05 DIAGNOSIS — R5381 Other malaise: Secondary | ICD-10-CM | POA: Insufficient documentation

## 2013-04-05 DIAGNOSIS — R1011 Right upper quadrant pain: Secondary | ICD-10-CM | POA: Insufficient documentation

## 2013-04-05 DIAGNOSIS — I252 Old myocardial infarction: Secondary | ICD-10-CM | POA: Insufficient documentation

## 2013-04-05 DIAGNOSIS — R109 Unspecified abdominal pain: Secondary | ICD-10-CM

## 2013-04-05 DIAGNOSIS — D649 Anemia, unspecified: Secondary | ICD-10-CM | POA: Insufficient documentation

## 2013-04-05 DIAGNOSIS — Z79899 Other long term (current) drug therapy: Secondary | ICD-10-CM | POA: Insufficient documentation

## 2013-04-05 DIAGNOSIS — Z7982 Long term (current) use of aspirin: Secondary | ICD-10-CM | POA: Insufficient documentation

## 2013-04-05 LAB — URINALYSIS, ROUTINE W REFLEX MICROSCOPIC
Glucose, UA: NEGATIVE mg/dL
Ketones, ur: NEGATIVE mg/dL
Protein, ur: 30 mg/dL — AB

## 2013-04-05 LAB — CBC WITH DIFFERENTIAL/PLATELET
Basophils Absolute: 0 10*3/uL (ref 0.0–0.1)
Basophils Relative: 0 % (ref 0–1)
Eosinophils Absolute: 0.9 10*3/uL — ABNORMAL HIGH (ref 0.0–0.7)
Eosinophils Relative: 12 % — ABNORMAL HIGH (ref 0–5)
HCT: 30.3 % — ABNORMAL LOW (ref 36.0–46.0)
MCH: 35.7 pg — ABNORMAL HIGH (ref 26.0–34.0)
MCHC: 35.3 g/dL (ref 30.0–36.0)
MCV: 101 fL — ABNORMAL HIGH (ref 78.0–100.0)
Monocytes Absolute: 0.5 10*3/uL (ref 0.1–1.0)
RDW: 12.9 % (ref 11.5–15.5)

## 2013-04-05 LAB — COMPREHENSIVE METABOLIC PANEL
AST: 17 U/L (ref 0–37)
Albumin: 3.3 g/dL — ABNORMAL LOW (ref 3.5–5.2)
CO2: 21 mEq/L (ref 19–32)
Calcium: 9 mg/dL (ref 8.4–10.5)
Creatinine, Ser: 8.64 mg/dL — ABNORMAL HIGH (ref 0.50–1.10)
GFR calc non Af Amer: 4 mL/min — ABNORMAL LOW (ref >90)

## 2013-04-05 LAB — URINE MICROSCOPIC-ADD ON

## 2013-04-05 MED ORDER — PROMETHAZINE HCL 25 MG PO TABS: 25.0000 mg | ORAL_TABLET | Freq: Four times a day (QID) | ORAL | Status: DC | PRN

## 2013-04-05 MED ORDER — HYDROMORPHONE HCL PF 1 MG/ML IJ SOLN
1.0000 mg | Freq: Once | INTRAMUSCULAR | Status: AC
  Administered 2013-04-05: 1 mg via INTRAVENOUS
  Filled 2013-04-05: qty 1

## 2013-04-05 MED ORDER — OXYCODONE-ACETAMINOPHEN 5-325 MG PO TABS: ORAL_TABLET | ORAL | Status: DC

## 2013-04-05 MED ORDER — ONDANSETRON HCL 4 MG/2ML IJ SOLN
4.0000 mg | Freq: Once | INTRAMUSCULAR | Status: AC
  Administered 2013-04-05: 4 mg via INTRAVENOUS
  Filled 2013-04-05: qty 2

## 2013-04-05 NOTE — ED Provider Notes (Signed)
62 year old female, history of end-stage renal disease on dialysis, presents with left flank pain, this is intermittent, colicky and on my exam has CVA tenderness on the left. She has a soft abdomen, her dialysis access in the left upper extremity is normal with a good thrill. There is no redness or swelling over the site. She has had pain medications by the time I had seen her and these have helped and she is essentially pain-free. She still has intermittent breakthrough colicky pain. CT scan shows slight swelling of the collecting system on the left consistent with a possible kidney stone or other urinary obstruction. This is mild, the patient possibly has passed a kidney stone as there is no one seen on the CT scan.  Pain medication, followup with urology as an outpatient.  Medical screening examination/treatment/procedure(s) were conducted as a shared visit with non-physician practitioner(s) and myself.  I personally evaluated the patient during the encounter.  Clinical Impression: Flank Pain      Johnna Acosta, MD 04/05/13 [phone]

## 2013-04-05 NOTE — ED Notes (Signed)
Care transferred, report received Jacqlyn Larsen, Therapist, sports.

## 2013-04-05 NOTE — ED Notes (Signed)
The pt has had a sudden onset of lt flank pain since 0015 tonight.  No nv.  Severe pain.  Dialysis pt that has a graft in her lt arm.  She was supposed to be dialyzed yesterday but she was ill and could not go

## 2013-04-05 NOTE — ED Provider Notes (Signed)
CSN: [id number]     Arrival date & time 04/05/13  [id number] History   None    Chief Complaint  Patient presents with  . Flank Pain   (Consider location/radiation/quality/duration/timing/severity/associated sxs/prior Treatment) HPI  Roberta Bryant is a 62 y.o. female past medical history significant for hypertension, non-insulin-dependent diabetes, ESRD on dialysis ([facility]) complaining of fatigue onset yesterday (patient missed dialysis yesterday) with acute left flank pain at approximately midnight tonight. Patient denies fever, nausea vomiting,hematuria, dysuria, fever. Pain is colicky. Patient has history of prior kidney stones, last one was in the 2s. She does not see a nephrologist. Denies chest pain, shortness of breath, headache, abdominal pain, change in bowel habits, melena, hematochezia.   Past Medical History  Diagnosis Date  . Renal disorder   . Hypertension   . Diabetes mellitus   . Asthma   . Anemia   . Myocardial infarction    Past Surgical History  Procedure Laterality Date  . Appendectomy    . Cholecystectomy    . Tubal ligation    . Hernia repair      X2  . Av fistula placement Left 12/03/2006  . Av fistula repair Left 11/25/2009  . Insertion of dialysis catheter  2010   No family history on file. History  Substance Use Topics  . Smoking status: Never Smoker   . Smokeless tobacco: Not on file  . Alcohol Use: No   OB History   Grav Para Term Preterm Abortions TAB SAB Ect Mult Living                 Review of Systems 10 systems reviewed and found to be negative, except as noted in the HPI   Allergies  Sulfa antibiotics; Amoxicillin; Ibuprofen; and Naldecon senior  Home Medications   Current Outpatient Rx  Name  Route  Sig  Dispense  Refill  . acetaminophen (TYLENOL) 500 MG tablet   Oral   Take 500 mg by mouth every 6 (six) hours as needed for pain.         Marland Kitchen aspirin EC 81 MG tablet   Oral   Take 81 mg by mouth daily.          . calcium acetate (PHOSLO) 667 MG capsule   Oral   Take 1,334 mg by mouth 4 (four) times daily -  with meals and at bedtime. 2 capsules with meals and 2 capsules with snacks         . carvedilol (COREG) 25 MG tablet   Oral   Take 12.5 mg by mouth at bedtime.          . dicyclomine (BENTYL) 10 MG capsule   Oral   Take 10 mg by mouth 2 (two) times daily.         Marland Kitchen docusate sodium (COLACE) 100 MG capsule   Oral   Take 100 mg by mouth 2 (two) times daily.         . folic acid-vitamin b complex-vitamin c-selenium-zinc (DIALYVITE) 3 MG TABS tablet   Oral   Take 1 tablet by mouth daily.         . isosorbide mononitrate (IMDUR) 30 MG 24 hr tablet   Oral   Take 30 mg by mouth daily.         Marland Kitchen levothyroxine (SYNTHROID, LEVOTHROID) 50 MCG tablet   Oral   Take 50 mcg by mouth daily.         Marland Kitchen loperamide (IMODIUM A-D) 2 MG  tablet   Oral   Take 2 mg by mouth 4 (four) times daily as needed for diarrhea or loose stools.         . meclizine (ANTIVERT) 12.5 MG tablet   Oral   Take 12.5 mg by mouth 3 (three) times daily as needed. For vertigo         . methocarbamol (ROBAXIN) 500 MG tablet   Oral   Take 500 mg by mouth 3 (three) times daily as needed (muscle spams).         . Multiple Vitamins-Minerals (HAIR/SKIN/NAILS/BIOTIN PO)   Oral   Take 1 capsule by mouth daily.         Marland Kitchen omeprazole (PRILOSEC OTC) 20 MG tablet   Oral   Take 20 mg by mouth daily.         Marland Kitchen oxyCODONE-acetaminophen (PERCOCET/ROXICET) 5-325 MG per tablet   Oral   Take 1 tablet by mouth every 6 (six) hours as needed for pain.   20 tablet   0   . prochlorperazine (COMPAZINE) 5 MG tablet   Oral   Take 5 mg by mouth 3 (three) times daily as needed for nausea.         . promethazine (PHENERGAN) 12.5 MG tablet   Oral   Take 12.5 mg by mouth 3 (three) times daily as needed. For nausea and vomiting         . traMADol (ULTRAM) 50 MG tablet   Oral   Take 50 mg by mouth 2 (two) times  daily as needed. For pain         . traZODone (DESYREL) 50 MG tablet   Oral   Take 25 mg by mouth at bedtime as needed for sleep.          Marland Kitchen Zinc 50 MG CAPS   Oral   Take 1 capsule by mouth daily.          BP 163/85  Pulse 68  Temp(Src) 98.1 F (36.7 C) (Oral)  Resp 24  SpO2 96% Physical Exam  Nursing note and vitals reviewed. Constitutional: She is oriented to person, place, and time. She appears well-developed and well-nourished. No distress.  Obese, appears acutely uncomfortable.  HENT:  Head: Normocephalic.  Eyes: Conjunctivae and EOM are normal.  Cardiovascular: Normal rate.   Pulmonary/Chest: Effort normal and breath sounds normal. No stridor. No respiratory distress. She has no wheezes. She has no rales. She exhibits no tenderness.  Abdominal: Soft. Bowel sounds are normal. She exhibits no distension and no mass. There is tenderness. There is no rebound and no guarding.  Mild right upper quadrant pain with no guarding or rebound.   Genitourinary:  Mild left flank pain  Musculoskeletal: Normal range of motion.  Neurological: She is alert and oriented to person, place, and time.  Psychiatric: She has a normal mood and affect.    ED Course  Procedures (including critical care time) Labs Review Labs Reviewed  URINALYSIS, ROUTINE W REFLEX MICROSCOPIC - Abnormal; Notable for the following:    Hgb urine dipstick MODERATE (*)    Protein, ur 30 (*)    All other components within normal limits  CBC WITH DIFFERENTIAL - Abnormal; Notable for the following:    RBC 3.00 (*)    Hemoglobin 10.7 (*)    HCT 30.3 (*)    MCV 101.0 (*)    MCH 35.7 (*)    Eosinophils Relative 12 (*)    Eosinophils Absolute 0.9 (*)  All other components within normal limits  COMPREHENSIVE METABOLIC PANEL - Abnormal; Notable for the following:    Glucose, Bld 148 (*)    BUN 87 (*)    Creatinine, Ser 8.64 (*)    Albumin 3.3 (*)    Total Bilirubin 0.2 (*)    GFR calc non Af Amer 4 (*)     GFR calc Af Amer 5 (*)    All other components within normal limits  URINE MICROSCOPIC-ADD ON - Abnormal; Notable for the following:    Squamous Epithelial / LPF FEW (*)    All other components within normal limits  URINE CULTURE   Imaging Review Ct Abdomen Pelvis Wo Contrast  04/05/2013   CLINICAL DATA:  Sudden onset of severe left flank pain and left lower quadrant pain. End stage renal disease.  EXAM: CT ABDOMEN AND PELVIS WITHOUT CONTRAST  TECHNIQUE: Multidetector CT imaging of the abdomen and pelvis was performed following the standard protocol without intravenous contrast.  COMPARISON:  CT scan dated 10/29/2011  FINDINGS: View liver, spleen, pancreas, and adrenal glands are normal. Prior cholecystectomy. Biliary tree is otherwise normal. Bilateral renal atrophy.  Patient has developed dilatation of the left renal collecting system including the left ureter. The ureter appears to be dilated to the level of the bladder. There is no definitive left ureteral calculus although on the reconstructed images there is a tiny density which appears to be within the ureter on image number 46 of series 5 at the L5 level.  The bowel is normal. Uterus and ovaries are normal. No significant osseous abnormality. Tiny periumbilical hernia containing only fat, unchanged.  IMPRESSION: New dilatation of the left renal collecting system. The ureter appears to be dilated to the level of the bladder. No definitive ureteral stone.   Electronically Signed   By: Rozetta Nunnery M.D.   On: 04/05/2013 07:21    EKG Interpretation   None       MDM   1. Left flank pain    Filed Vitals:   04/05/13 0609 04/05/13 0630 04/05/13 0715 04/05/13 0730  BP: 163/85 141/72 124/61 131/57  Pulse: 68 64 57 58  Temp:      TempSrc:      Resp: 24 16 14    SpO2: 96% 95% 90% 91%     Roberta Bryant is a 62 y.o. female with acute onset of left flank pain, urinalysis shows hematuria, no concern for urinary tract infection however  her urine cultured is ordered. Dialysis patient who missed dialysis yesterday, no indications for emergent dialysis. CAT scan shows a dilation of the left ureter with no stones visualized on CT. Patient's pain is well-controlled in the emergency room, she is afebrile, no nausea or vomiting. This is a shared visit with attending physician Dr. Sabra Heck was personally evaluated the patient and agrees with care plan. Patient is amenable to discharge, I encouraged a close urology and primary care followup. We have discussed return precautions.  Medications  HYDROmorphone (DILAUDID) injection 1 mg (1 mg Intravenous Given 04/05/13 0624)  ondansetron (ZOFRAN) injection 4 mg (4 mg Intravenous Given 04/05/13 0623)  ondansetron (ZOFRAN) injection 4 mg (4 mg Intravenous Given 04/05/13 0721)    Pt is hemodynamically stable, appropriate for, and amenable to discharge at this time. Pt verbalized understanding and agrees with care plan. All questions answered. Outpatient follow-up and specific return precautions discussed.    New Prescriptions   OXYCODONE-ACETAMINOPHEN (PERCOCET/ROXICET) 5-325 MG PER TABLET    1 to 2 tabs  PO q6hrs  PRN for pain    Note: Portions of this report may have been transcribed using voice recognition software. Every effort was made to ensure accuracy; however, inadvertent computerized transcription errors may be present      Monico Blitz, PA-C 04/05/13 [phone]

## 2013-04-06 LAB — URINE CULTURE

## 2013-04-06 NOTE — ED Provider Notes (Signed)
Medical screening examination/treatment/procedure(s) were conducted as a shared visit with non-physician practitioner(s) and myself.  I personally evaluated the patient during the encounter  Please see my separate respective documentation pertaining to this patient encounter   Johnna Acosta, MD 04/06/13 [phone]

## 2013-06-03 ENCOUNTER — Emergency Department (HOSPITAL_COMMUNITY)
Admission: EM | Admit: 2013-06-03 | Discharge: 2013-06-03 | Disposition: A | Discharge status: Home / Self Care | Payer: Medicare Other | Attending: Emergency Medicine | Admitting: Emergency Medicine

## 2013-06-03 ENCOUNTER — Encounter (HOSPITAL_COMMUNITY): Payer: Self-pay | Admitting: Emergency Medicine

## 2013-06-03 ENCOUNTER — Emergency Department (HOSPITAL_COMMUNITY): Payer: Medicare Other

## 2013-06-03 DIAGNOSIS — Z888 Allergy status to other drugs, medicaments and biological substances status: Secondary | ICD-10-CM | POA: Insufficient documentation

## 2013-06-03 DIAGNOSIS — Z8669 Personal history of other diseases of the nervous system and sense organs: Secondary | ICD-10-CM | POA: Insufficient documentation

## 2013-06-03 DIAGNOSIS — M25561 Pain in right knee: Secondary | ICD-10-CM

## 2013-06-03 DIAGNOSIS — Z882 Allergy status to sulfonamides status: Secondary | ICD-10-CM | POA: Insufficient documentation

## 2013-06-03 DIAGNOSIS — M25569 Pain in unspecified knee: Secondary | ICD-10-CM | POA: Insufficient documentation

## 2013-06-03 DIAGNOSIS — D649 Anemia, unspecified: Secondary | ICD-10-CM | POA: Insufficient documentation

## 2013-06-03 DIAGNOSIS — Z79899 Other long term (current) drug therapy: Secondary | ICD-10-CM | POA: Insufficient documentation

## 2013-06-03 DIAGNOSIS — Z881 Allergy status to other antibiotic agents status: Secondary | ICD-10-CM | POA: Insufficient documentation

## 2013-06-03 DIAGNOSIS — I252 Old myocardial infarction: Secondary | ICD-10-CM | POA: Insufficient documentation

## 2013-06-03 DIAGNOSIS — J45909 Unspecified asthma, uncomplicated: Secondary | ICD-10-CM | POA: Insufficient documentation

## 2013-06-03 DIAGNOSIS — E119 Type 2 diabetes mellitus without complications: Secondary | ICD-10-CM | POA: Insufficient documentation

## 2013-06-03 DIAGNOSIS — I129 Hypertensive chronic kidney disease with stage 1 through stage 4 chronic kidney disease, or unspecified chronic kidney disease: Secondary | ICD-10-CM | POA: Insufficient documentation

## 2013-06-03 DIAGNOSIS — N289 Disorder of kidney and ureter, unspecified: Secondary | ICD-10-CM | POA: Insufficient documentation

## 2013-06-03 DIAGNOSIS — Z7982 Long term (current) use of aspirin: Secondary | ICD-10-CM | POA: Insufficient documentation

## 2013-06-03 DIAGNOSIS — M79609 Pain in unspecified limb: Secondary | ICD-10-CM

## 2013-06-03 LAB — CBC WITH DIFFERENTIAL/PLATELET
Basophils Absolute: 0 10*3/uL (ref 0.0–0.1)
Eosinophils Relative: 0 % (ref 0–5)
HCT: 32.7 % — ABNORMAL LOW (ref 36.0–46.0)
Lymphocytes Relative: 13 % (ref 12–46)
Lymphs Abs: 1 10*3/uL (ref 0.7–4.0)
MCV: 101.9 fL — ABNORMAL HIGH (ref 78.0–100.0)
Monocytes Absolute: 0.6 10*3/uL (ref 0.1–1.0)
Monocytes Relative: 7 % (ref 3–12)
RDW: 13.2 % (ref 11.5–15.5)
WBC: 8.3 10*3/uL (ref 4.0–10.5)

## 2013-06-03 LAB — BASIC METABOLIC PANEL WITH GFR
BUN: 116 mg/dL — ABNORMAL HIGH (ref 6–23)
CO2: 18 meq/L — ABNORMAL LOW (ref 19–32)
Calcium: 7.9 mg/dL — ABNORMAL LOW (ref 8.4–10.5)
Chloride: 98 meq/L (ref 96–112)
Creatinine, Ser: 7.74 mg/dL — ABNORMAL HIGH (ref 0.50–1.10)
GFR calc Af Amer: 6 mL/min — ABNORMAL LOW
GFR calc non Af Amer: 5 mL/min — ABNORMAL LOW
Glucose, Bld: 150 mg/dL — ABNORMAL HIGH (ref 70–99)
Potassium: 4.9 meq/L (ref 3.5–5.1)
Sodium: 135 meq/L (ref 135–145)

## 2013-06-03 MED ORDER — OXYCODONE-ACETAMINOPHEN 5-325 MG PO TABS
1.0000 | ORAL_TABLET | Freq: Once | ORAL | Status: AC
  Administered 2013-06-03: 1 via ORAL
  Filled 2013-06-03: qty 1

## 2013-06-03 MED ORDER — OXYCODONE-ACETAMINOPHEN 5-325 MG PO TABS: 1.0000 | ORAL_TABLET | Freq: Four times a day (QID) | ORAL | Status: DC | PRN

## 2013-06-03 NOTE — ED Provider Notes (Signed)
CSN: [id number]     Arrival date & time 06/03/13  0509 History   First MD Initiated Contact with Patient 06/03/13 [phone]     Chief Complaint  Patient presents with  . Leg Pain   (Consider location/radiation/quality/duration/timing/severity/associated sxs/prior Treatment) HPI Pt presents with c/o pain behind her right knee.  She states pain started yesterday while she was seated in the car.  She had no trauma, no fall or twisting injury.  Pt states the pain radiates up to thigh and down to ankle.  Worse with movement and bearing weight.  No swelling of leg.  Pt is also concerned due to recent dianosis of bell's palsy- she has been taking prednisone. States she was told she would have the symptoms 6-8 weeks but the symptoms are bothering her very much.  No weakness of arms or legs, no changes in vision.  Exam is c/w bell's palsy.  There are no other associated systemic symptoms, there are no other alleviating or modifying factors.   Past Medical History  Diagnosis Date  . Renal disorder   . Hypertension   . Diabetes mellitus   . Asthma   . Anemia   . Myocardial infarction    Past Surgical History  Procedure Laterality Date  . Appendectomy    . Cholecystectomy    . Tubal ligation    . Hernia repair      X2  . Av fistula placement Left 12/03/2006  . Av fistula repair Left 11/25/2009  . Insertion of dialysis catheter  2010   History reviewed. No pertinent family history. History  Substance Use Topics  . Smoking status: Never Smoker   . Smokeless tobacco: Not on file  . Alcohol Use: No   OB History   Grav Para Term Preterm Abortions TAB SAB Ect Mult Living                 Review of Systems ROS reviewed and all otherwise negative except for mentioned in HPI  Allergies  Sulfa antibiotics; Amoxicillin; Ibuprofen; and Naldecon senior  Home Medications   Current Outpatient Rx  Name  Route  Sig  Dispense  Refill  . aspirin EC 81 MG tablet   Oral   Take 81 mg by mouth  daily.         . calcium acetate (PHOSLO) 667 MG capsule   Oral   Take 1,334-2,668 mg by mouth 5 (five) times daily as needed (see admin instructions). Takes 4 capsules three times daily before each meal. Take 2 capsules twice daily with snacks.         . carvedilol (COREG) 12.5 MG tablet   Oral   Take 12.5 mg by mouth 2 (two) times daily with a meal.         . dicyclomine (BENTYL) 10 MG capsule   Oral   Take 10 mg by mouth 2 (two) times daily.         Marland Kitchen docusate sodium (COLACE) 100 MG capsule   Oral   Take 100 mg by mouth 2 (two) times daily.         . finasteride (PROSCAR) 5 MG tablet   Oral   Take 5 mg by mouth daily.         . isosorbide mononitrate (IMDUR) 30 MG 24 hr tablet   Oral   Take 30 mg by mouth daily.         Marland Kitchen levothyroxine (SYNTHROID, LEVOTHROID) 50 MCG tablet   Oral   Take  50 mcg by mouth daily.         . meclizine (ANTIVERT) 25 MG tablet   Oral   Take 12.5-25 mg by mouth every 8 (eight) hours as needed for dizziness.         . multivitamin (RENA-VIT) TABS tablet   Oral   Take 1 tablet by mouth daily.         Marland Kitchen oxyCODONE-acetaminophen (PERCOCET/ROXICET) 5-325 MG per tablet   Oral   Take 2 tablets by mouth every 6 (six) hours as needed for severe pain.         . predniSONE (DELTASONE) 20 MG tablet   Oral   Take 20 mg by mouth 3 (three) times daily. Takes for 7 days.  First dose 05/26/2013.         . prochlorperazine (COMPAZINE) 5 MG tablet   Oral   Take 5 mg by mouth 3 (three) times daily as needed for nausea.         . promethazine (PHENERGAN) 25 MG tablet   Oral   Take 1 tablet (25 mg total) by mouth every 6 (six) hours as needed for nausea.   12 tablet   0   . traMADol (ULTRAM) 50 MG tablet   Oral   Take 50 mg by mouth 2 (two) times daily as needed. For pain         . traZODone (DESYREL) 50 MG tablet   Oral   Take 25 mg by mouth at bedtime as needed for sleep.          Marland Kitchen Zinc 50 MG CAPS   Oral   Take 1  capsule by mouth daily.         Marland Kitchen oxyCODONE-acetaminophen (PERCOCET/ROXICET) 5-325 MG per tablet   Oral   Take 1-2 tablets by mouth every 6 (six) hours as needed for severe pain.   15 tablet   0    BP 183/68  Pulse 56  Temp(Src) 98 F (36.7 C) (Oral)  Resp 16  SpO2 98% Vitals reviewed Physical Exam Physical Examination: General appearance - alert, well appearing, and in no distress Mental status - alert, oriented to person, place, and time Eyes - pupils equal and reactive, extraocular eye movements intact Mouth - mucous membranes moist, pharynx normal without lesions Chest - clear to auscultation, no wheezes, rales or rhonchi, symmetric air entry Heart - normal rate, regular rhythm, normal S1, S2, no murmurs, rubs, clicks or gallops Abdomen - soft, nontender, nondistended, no masses or organomegaly Neurological - alert, oriented x 3, left sided facial droop, flat forehead on left with eyebrow raise, ptosis on left, strength 5/5 in extremities x 4, sensation intact MS- ttp behind right knee and overlying right calf, some pain with flexion/extension of right knee, no edema, no pain with ROM of right hip.  Extremities - peripheral pulses normal, no pedal edema, no clubbing or cyanosis Skin - normal coloration and turgor, no rashes  ED Course  Procedures (including critical care time) Labs Review Labs Reviewed  CBC WITH DIFFERENTIAL - Abnormal; Notable for the following:    RBC 3.21 (*)    Hemoglobin 11.2 (*)    HCT 32.7 (*)    MCV 101.9 (*)    MCH 34.9 (*)    Neutrophils Relative % 80 (*)    All other components within normal limits  BASIC METABOLIC PANEL - Abnormal; Notable for the following:    CO2 18 (*)    Glucose, Bld 150 (*)  BUN 116 (*)    Creatinine, Ser 7.74 (*)    Calcium 7.9 (*)    GFR calc non Af Amer 5 (*)    GFR calc Af Amer 6 (*)    All other components within normal limits   Imaging Review Dg Knee Complete 4 Views Right  06/03/2013   CLINICAL  DATA:  Four-day history is of anterior right knee pain without history of injury  EXAM: RIGHT KNEE - COMPLETE 4+ VIEW  COMPARISON:  None.  FINDINGS: The bones appear adequately mineralized. There is mild beaking of the tibial spines. There is a tiny spur noted from the medial aspect of the lateral femoral condyle. There is no evidence of a tibial plateau fracture. There is no chondrocalcinosis. No joint effusion is evident.  IMPRESSION: There is no acute bony abnormality of the right knee. Minimal degenerative change is suspected.   Electronically Signed   By: David  Martinique   On: 06/03/2013 11:07    EKG Interpretation   None       MDM   1. Knee pain, right    No evidence of DVT on duplex, xray reassuring with mild degenerative changes- xray images reviewed and interpreted by me.  Pt treated with po pain meds.  Given information for ortho followup if symptoms persist. Neuro exam c/w diagnosis of Bell's palsy- pt to continue prednisone as previously prescribed.  Discharged with strict return precautions.  Pt agreeable with plan.    Threasa Beards, MD 06/03/13 [phone]

## 2013-06-03 NOTE — ED Notes (Signed)
Patient transported to Ultrasound 

## 2013-06-03 NOTE — ED Notes (Signed)
Patient transported to X-ray 

## 2013-06-03 NOTE — Progress Notes (Signed)
VASCULAR LAB PRELIMINARY  PRELIMINARY  PRELIMINARY  PRELIMINARY  Right lower extremity venous Doppler completed.    Preliminary report:  There is no DVT or SVT noted in the right lower extremity.  Dylann Gallier, RVT 06/03/2013, 9:05 AM

## 2013-06-03 NOTE — ED Notes (Signed)
Presents with right leg pain behind knee began Wednesday while sitting in a car and looking at [employer]. Nothing makes pain better. Pain radiates down to ankle described as excrutiating and unable to walk due to pain, pain is constant and worse with movement. Dx with Bells palsy of the left side of face one week ago, palsy has gotten worse. Pt reports frequent drooling and tremors of the left side of face. Dialysis pt, missing dialysis today due to pain and worsening palsy. Denies SOB.

## 2013-06-10 ENCOUNTER — Encounter (HOSPITAL_COMMUNITY): Payer: Self-pay | Admitting: Emergency Medicine

## 2013-06-10 DIAGNOSIS — E119 Type 2 diabetes mellitus without complications: Secondary | ICD-10-CM | POA: Insufficient documentation

## 2013-06-10 DIAGNOSIS — R609 Edema, unspecified: Secondary | ICD-10-CM | POA: Insufficient documentation

## 2013-06-10 DIAGNOSIS — J45909 Unspecified asthma, uncomplicated: Secondary | ICD-10-CM | POA: Insufficient documentation

## 2013-06-10 DIAGNOSIS — Z79899 Other long term (current) drug therapy: Secondary | ICD-10-CM | POA: Insufficient documentation

## 2013-06-10 DIAGNOSIS — N186 End stage renal disease: Secondary | ICD-10-CM | POA: Insufficient documentation

## 2013-06-10 DIAGNOSIS — I120 Hypertensive chronic kidney disease with stage 5 chronic kidney disease or end stage renal disease: Secondary | ICD-10-CM | POA: Insufficient documentation

## 2013-06-10 DIAGNOSIS — Z862 Personal history of diseases of the blood and blood-forming organs and certain disorders involving the immune mechanism: Secondary | ICD-10-CM | POA: Insufficient documentation

## 2013-06-10 DIAGNOSIS — I252 Old myocardial infarction: Secondary | ICD-10-CM | POA: Insufficient documentation

## 2013-06-10 DIAGNOSIS — Z7982 Long term (current) use of aspirin: Secondary | ICD-10-CM | POA: Insufficient documentation

## 2013-06-10 DIAGNOSIS — R079 Chest pain, unspecified: Secondary | ICD-10-CM | POA: Insufficient documentation

## 2013-06-10 NOTE — ED Notes (Signed)
2115: left lateral wall cp. Non reproducible.  Pain radiates to back. Took tylenol for pain with mild pain relief. On dialysis (left AV fistula).

## 2013-06-11 ENCOUNTER — Emergency Department (HOSPITAL_COMMUNITY): Payer: Medicare Other

## 2013-06-11 ENCOUNTER — Emergency Department (HOSPITAL_COMMUNITY)
Admission: EM | Admit: 2013-06-11 | Discharge: 2013-06-11 | Disposition: A | Discharge status: Home / Self Care | Payer: Medicare Other | Attending: Emergency Medicine | Admitting: Emergency Medicine

## 2013-06-11 DIAGNOSIS — R079 Chest pain, unspecified: Secondary | ICD-10-CM

## 2013-06-11 LAB — POCT I-STAT TROPONIN I
Troponin i, poc: 0.02 ng/mL (ref 0.00–0.08)
Troponin i, poc: 0.02 ng/mL (ref 0.00–0.08)

## 2013-06-11 LAB — CBC
HEMATOCRIT: 34.2 % — AB (ref 36.0–46.0)
HEMOGLOBIN: 11.4 g/dL — AB (ref 12.0–15.0)
MCH: 35.2 pg — AB (ref 26.0–34.0)
MCHC: 33.3 g/dL (ref 30.0–36.0)
MCV: 105.6 fL — ABNORMAL HIGH (ref 78.0–100.0)
Platelets: 186 10*3/uL (ref 150–400)
RBC: 3.24 MIL/uL — ABNORMAL LOW (ref 3.87–5.11)
RDW: 13.5 % (ref 11.5–15.5)
WBC: 6.8 10*3/uL (ref 4.0–10.5)

## 2013-06-11 LAB — BASIC METABOLIC PANEL
BUN: 37 mg/dL — AB (ref 6–23)
CHLORIDE: 96 meq/L (ref 96–112)
CO2: 27 mEq/L (ref 19–32)
Calcium: 10.1 mg/dL (ref 8.4–10.5)
Creatinine, Ser: 4.28 mg/dL — ABNORMAL HIGH (ref 0.50–1.10)
GFR calc Af Amer: 12 mL/min — ABNORMAL LOW (ref >90)
GFR calc non Af Amer: 10 mL/min — ABNORMAL LOW (ref >90)
Glucose, Bld: 136 mg/dL — ABNORMAL HIGH (ref 70–99)
POTASSIUM: 4.8 meq/L (ref 3.7–5.3)
Sodium: 136 mEq/L — ABNORMAL LOW (ref 137–147)

## 2013-06-11 MED ORDER — ACETAMINOPHEN 325 MG PO TABS
650.0000 mg | ORAL_TABLET | Freq: Once | ORAL | Status: AC
  Administered 2013-06-11: 650 mg via ORAL
  Filled 2013-06-11: qty 2

## 2013-06-11 NOTE — Discharge Instructions (Signed)
Follow up with your doctor. You may need a stress test.   Return to ER if you have severe chest pain, shortness of breath.

## 2013-06-11 NOTE — ED Provider Notes (Signed)
CSN: [id number]     Arrival date & time 06/10/13  2338 History   First MD Initiated Contact with Patient 06/11/13 [phone]     Chief Complaint  Patient presents with  . Chest Pain   (Consider location/radiation/quality/duration/timing/severity/associated sxs/prior Treatment) The history is provided by the patient.  Roberta Bryant is a 63 y.o. female hx of ESRD on HD (last HD was yesterday) here with L sided chest pain. L sided chest pain started around 10 pm last night, lasted several hours. Took tylenol with some relief. Pain free for the last several hours. No history of CAD. Nonsmoker.    Past Medical History  Diagnosis Date  . Renal disorder   . Hypertension   . Diabetes mellitus   . Asthma   . Anemia   . Myocardial infarction    Past Surgical History  Procedure Laterality Date  . Appendectomy    . Cholecystectomy    . Tubal ligation    . Hernia repair      X2  . Av fistula placement Left 12/03/2006  . Av fistula repair Left 11/25/2009  . Insertion of dialysis catheter  2010   No family history on file. History  Substance Use Topics  . Smoking status: Never Smoker   . Smokeless tobacco: Not on file  . Alcohol Use: No   OB History   Grav Para Term Preterm Abortions TAB SAB Ect Mult Living                 Review of Systems  Cardiovascular: Positive for chest pain.  All other systems reviewed and are negative.    Allergies  Sulfa antibiotics; Amoxicillin; Ibuprofen; and Naldecon senior  Home Medications   Current Outpatient Rx  Name  Route  Sig  Dispense  Refill  . aspirin EC 81 MG tablet   Oral   Take 81 mg by mouth daily.         . calcium acetate (PHOSLO) 667 MG capsule   Oral   Take 1,334-2,668 mg by mouth See admin instructions. Takes 4 capsules three times daily before each meal. Take 2 capsules twice daily with snacks.         . carvedilol (COREG) 12.5 MG tablet   Oral   Take 12.5 mg by mouth 2 (two) times daily with a meal.         .  dicyclomine (BENTYL) 10 MG capsule   Oral   Take 10 mg by mouth 2 (two) times daily.         Marland Kitchen docusate sodium (COLACE) 100 MG capsule   Oral   Take 100 mg by mouth 2 (two) times daily.         . finasteride (PROSCAR) 5 MG tablet   Oral   Take 5 mg by mouth daily.         . isosorbide mononitrate (IMDUR) 30 MG 24 hr tablet   Oral   Take 30 mg by mouth daily.         Marland Kitchen levothyroxine (SYNTHROID, LEVOTHROID) 50 MCG tablet   Oral   Take 50 mcg by mouth daily.         . meclizine (ANTIVERT) 25 MG tablet   Oral   Take 12.5-25 mg by mouth every 8 (eight) hours as needed for dizziness.         . multivitamin (RENA-VIT) TABS tablet   Oral   Take 1 tablet by mouth daily.         Marland Kitchen  omeprazole (PRILOSEC) 20 MG capsule   Oral   Take 20 mg by mouth 2 (two) times daily before a meal.         . oxyCODONE-acetaminophen (PERCOCET/ROXICET) 5-325 MG per tablet   Oral   Take 1-2 tablets by mouth every 6 (six) hours as needed for severe pain.   15 tablet   0   . predniSONE (DELTASONE) 20 MG tablet   Oral   Take 20 mg by mouth daily with breakfast. Take for 4 days. Continued from previous prescription.         . traZODone (DESYREL) 50 MG tablet   Oral   Take 25 mg by mouth at bedtime as needed for sleep.          Marland Kitchen Zinc 50 MG CAPS   Oral   Take 1 capsule by mouth daily.          BP 154/61  Pulse 60  Temp(Src) 99 F (37.2 C) (Oral)  Resp 24  Ht 4' 11.75" (1.518 m)  Wt 220 lb (99.791 kg)  BMI 43.31 kg/m2  SpO2 98% Physical Exam  Nursing note and vitals reviewed. Constitutional: She is oriented to person, place, and time.  Chronically ill, overweight, NAD   HENT:  Head: Normocephalic.  Mouth/Throat: Oropharynx is clear and moist.  Eyes: Conjunctivae are normal. Pupils are equal, round, and reactive to light.  Neck: Normal range of motion. Neck supple.  Cardiovascular: Normal rate, regular rhythm and normal heart sounds.   Pulmonary/Chest: Effort  normal and breath sounds normal. No respiratory distress. She has no wheezes. She has no rales.  Abdominal: Soft. Bowel sounds are normal. She exhibits no distension. There is no tenderness. There is no rebound.  Musculoskeletal: Normal range of motion.  1+ edema (chronic)  Neurological: She is alert and oriented to person, place, and time.  Skin: Skin is warm and dry.  Psychiatric: She has a normal mood and affect. Her behavior is normal. Judgment and thought content normal.    ED Course  Procedures (including critical care time) Labs Review Labs Reviewed  BASIC METABOLIC PANEL - Abnormal; Notable for the following:    Sodium 136 (*)    Glucose, Bld 136 (*)    BUN 37 (*)    Creatinine, Ser 4.28 (*)    GFR calc non Af Amer 10 (*)    GFR calc Af Amer 12 (*)    All other components within normal limits  CBC - Abnormal; Notable for the following:    RBC 3.24 (*)    Hemoglobin 11.4 (*)    HCT 34.2 (*)    MCV 105.6 (*)    MCH 35.2 (*)    All other components within normal limits  POCT I-STAT TROPONIN I  POCT I-STAT TROPONIN I   Imaging Review Dg Chest 2 View  06/11/2013   CLINICAL DATA:  Left-sided chest pain. Previous myocardial infarct. Hypertension and diabetes.  EXAM: CHEST  2 VIEW  COMPARISON:  05/10/2013  FINDINGS: The heart size and mediastinal contours are within normal limits. Both lungs are clear. No evidence of pneumothorax or pleural effusion. No mass or lymphadenopathy identified. Old left 7th rib fracture deformity again noted.  IMPRESSION: No active cardiopulmonary disease.   Electronically Signed   By: Earle Gell M.D.   On: 06/11/2013 00:29    EKG Interpretation   None       MDM  No diagnosis found. Roberta Bryant is a 63 y.o. female here with  chest pain. Atypical for ACS and moderate risk. Will get trop x 2.   4:28 AM Trop neg x 2. Stable for d/c.      Wandra Arthurs, MD 06/11/13 [phone]

## 2013-07-28 DIAGNOSIS — G43009 Migraine without aura, not intractable, without status migrainosus: Secondary | ICD-10-CM | POA: Insufficient documentation

## 2013-09-23 ENCOUNTER — Emergency Department (HOSPITAL_COMMUNITY)
Admission: EM | Admit: 2013-09-23 | Discharge: 2013-09-23 | Disposition: A | Discharge status: Home / Self Care | Payer: Medicare Other | Attending: Emergency Medicine | Admitting: Emergency Medicine

## 2013-09-23 ENCOUNTER — Emergency Department (HOSPITAL_COMMUNITY): Payer: Medicare Other

## 2013-09-23 ENCOUNTER — Encounter (HOSPITAL_COMMUNITY): Payer: Self-pay | Admitting: Emergency Medicine

## 2013-09-23 DIAGNOSIS — Z79899 Other long term (current) drug therapy: Secondary | ICD-10-CM | POA: Insufficient documentation

## 2013-09-23 DIAGNOSIS — M2550 Pain in unspecified joint: Secondary | ICD-10-CM | POA: Insufficient documentation

## 2013-09-23 DIAGNOSIS — N186 End stage renal disease: Secondary | ICD-10-CM | POA: Insufficient documentation

## 2013-09-23 DIAGNOSIS — Z882 Allergy status to sulfonamides status: Secondary | ICD-10-CM | POA: Insufficient documentation

## 2013-09-23 DIAGNOSIS — R269 Unspecified abnormalities of gait and mobility: Secondary | ICD-10-CM | POA: Insufficient documentation

## 2013-09-23 DIAGNOSIS — IMO0001 Reserved for inherently not codable concepts without codable children: Secondary | ICD-10-CM | POA: Insufficient documentation

## 2013-09-23 DIAGNOSIS — Z888 Allergy status to other drugs, medicaments and biological substances status: Secondary | ICD-10-CM | POA: Insufficient documentation

## 2013-09-23 DIAGNOSIS — J45909 Unspecified asthma, uncomplicated: Secondary | ICD-10-CM | POA: Insufficient documentation

## 2013-09-23 DIAGNOSIS — R6889 Other general symptoms and signs: Secondary | ICD-10-CM | POA: Insufficient documentation

## 2013-09-23 DIAGNOSIS — R209 Unspecified disturbances of skin sensation: Secondary | ICD-10-CM | POA: Insufficient documentation

## 2013-09-23 DIAGNOSIS — M25551 Pain in right hip: Secondary | ICD-10-CM

## 2013-09-23 DIAGNOSIS — Z881 Allergy status to other antibiotic agents status: Secondary | ICD-10-CM | POA: Insufficient documentation

## 2013-09-23 DIAGNOSIS — D649 Anemia, unspecified: Secondary | ICD-10-CM | POA: Insufficient documentation

## 2013-09-23 DIAGNOSIS — I10 Essential (primary) hypertension: Secondary | ICD-10-CM | POA: Insufficient documentation

## 2013-09-23 DIAGNOSIS — E119 Type 2 diabetes mellitus without complications: Secondary | ICD-10-CM | POA: Insufficient documentation

## 2013-09-23 DIAGNOSIS — Z7982 Long term (current) use of aspirin: Secondary | ICD-10-CM | POA: Insufficient documentation

## 2013-09-23 DIAGNOSIS — Z992 Dependence on renal dialysis: Secondary | ICD-10-CM | POA: Insufficient documentation

## 2013-09-23 MED ORDER — OXYCODONE-ACETAMINOPHEN 5-325 MG PO TABS
2.0000 | ORAL_TABLET | Freq: Once | ORAL | Status: AC
  Administered 2013-09-23: 2 via ORAL
  Filled 2013-09-23: qty 2

## 2013-09-23 MED ORDER — OXYCODONE-ACETAMINOPHEN 5-325 MG PO TABS: 2.0000 | ORAL_TABLET | ORAL | Status: DC | PRN

## 2013-09-23 NOTE — ED Notes (Addendum)
Pt c/o right low back pain that radiates down right hip,leg and knee. Pt denies heavy lifting or recent injury. Pt reports that she has problems with her knee and not sure if that is the cause of her pain. Pt tried tylenol at 0545 without relief.

## 2013-09-23 NOTE — ED Provider Notes (Signed)
CSN: [id number]     Arrival date & time 09/23/13  [id number] History  This chart was scribed for non-physician practitioner working with Orlie Dakin, MD by Stacy Gardner, ED scribe. This patient was seen in room TR07C/TR07C and the patient's care was started at 9:17 AM.   First MD Initiated Contact with Patient 09/23/13 [phone]     Chief Complaint  Patient presents with  . Back Pain  . Hip Pain  . Knee Pain     (Consider location/radiation/quality/duration/timing/severity/associated sxs/prior Treatment) Patient is a 63 y.o. female presenting with back pain, hip pain, and knee pain. The history is provided by the patient and medical records. No language interpreter was used.  Back Pain Associated symptoms: numbness   Hip Pain  Knee Pain Associated symptoms: back pain    HPI Comments: Roberta Bryant is a 63 y.o. female who presents to the Emergency Department complaining of constant moderate back pain that radiates to her right hip, leg and knee, onset two days ago. Denies injury and heavy lifting. She reports falling four days ago after tripping over her dog however she states the pain did not start until two days later. Pt has chronic intermittent hip pain however she is unsure if this is related. Yesterday she mentions having trouble walking due to pain. The pain is worse with walking, bearing weight and movement. Pt states when she walks, it feels like "my knee wants to pop" . Pt has mild numbness to her right knee. She has a walking cane at home that she uses to ambulate.  Pt has tried tylenol four hours ago to no relief. Nothing seems to make her symptoms better. She has a hx of renal disorder and was unable to go to dialysis due to pain. She was treated with Oxycodone in the past which moderately improved her symptoms.  She was treated by an orthopedist Dr. Ernestina Patches for her knee pain and given hormone shots without relief. She has an appointment with her orthopedist next week.  Past  Medical History  Diagnosis Date  . Renal disorder   . Hypertension   . Diabetes mellitus   . Asthma   . Anemia   . Myocardial infarction    Past Surgical History  Procedure Laterality Date  . Appendectomy    . Cholecystectomy    . Tubal ligation    . Hernia repair      X2  . Av fistula placement Left 12/03/2006  . Av fistula repair Left 11/25/2009  . Insertion of dialysis catheter  2010   No family history on file. History  Substance Use Topics  . Smoking status: Never Smoker   . Smokeless tobacco: Not on file  . Alcohol Use: No   OB History   Grav Para Term Preterm Abortions TAB SAB Ect Mult Living                 Review of Systems  Musculoskeletal: Positive for arthralgias, back pain, gait problem and myalgias. Negative for joint swelling.  Neurological: Positive for numbness.  All other systems reviewed and are negative.     Allergies  Sulfa antibiotics; Amoxicillin; Ibuprofen; and Naldecon senior  Home Medications   Prior to Admission medications   Medication Sig Start Date End Date Taking? Authorizing Provider  aspirin EC 81 MG tablet Take 81 mg by mouth daily.    Historical Provider, MD  calcium acetate (PHOSLO) 667 MG capsule Take 1,334-2,668 mg by mouth See admin instructions. Takes 4 capsules three  times daily before each meal. Take 2 capsules twice daily with snacks.    Historical Provider, MD  carvedilol (COREG) 12.5 MG tablet Take 12.5 mg by mouth 2 (two) times daily with a meal.    Historical Provider, MD  dicyclomine (BENTYL) 10 MG capsule Take 10 mg by mouth 2 (two) times daily.    Historical Provider, MD  docusate sodium (COLACE) 100 MG capsule Take 100 mg by mouth 2 (two) times daily.    Historical Provider, MD  finasteride (PROSCAR) 5 MG tablet Take 5 mg by mouth daily.    Historical Provider, MD  isosorbide mononitrate (IMDUR) 30 MG 24 hr tablet Take 30 mg by mouth daily.    Historical Provider, MD  levothyroxine (SYNTHROID, LEVOTHROID) 50 MCG  tablet Take 50 mcg by mouth daily.    Historical Provider, MD  meclizine (ANTIVERT) 25 MG tablet Take 12.5-25 mg by mouth every 8 (eight) hours as needed for dizziness.    Historical Provider, MD  multivitamin (RENA-VIT) TABS tablet Take 1 tablet by mouth daily.    Historical Provider, MD  omeprazole (PRILOSEC) 20 MG capsule Take 20 mg by mouth 2 (two) times daily before a meal.    Historical Provider, MD  oxyCODONE-acetaminophen (PERCOCET/ROXICET) 5-325 MG per tablet Take 1-2 tablets by mouth every 6 (six) hours as needed for severe pain. 06/03/13   Threasa Beards, MD  traZODone (DESYREL) 50 MG tablet Take 25 mg by mouth at bedtime as needed for sleep.     Historical Provider, MD  Zinc 50 MG CAPS Take 1 capsule by mouth daily.    Historical Provider, MD   BP 121/71  Pulse 71  Temp(Src) 98.2 F (36.8 C)  Resp 18  Ht 4' 11.75" (1.518 m)  Wt 220 lb (99.791 kg)  BMI 43.31 kg/m2  SpO2 99% Physical Exam  Nursing note and vitals reviewed. Constitutional: She is oriented to person, place, and time. She appears well-developed and well-nourished. No distress.  HENT:  Head: Normocephalic and atraumatic.  Eyes: EOM are normal. Pupils are equal, round, and reactive to light.  Neck: Normal range of motion. Neck supple. No tracheal deviation present.  Cardiovascular: Normal rate and intact distal pulses.   Pulses intact   Pulmonary/Chest: Effort normal. No respiratory distress.  Abdominal: Soft. She exhibits no distension.  Musculoskeletal: She exhibits tenderness.       Right hip: She exhibits decreased range of motion and tenderness.       Right knee: She exhibits decreased range of motion. She exhibits no deformity. Tenderness found.  Right posterior hip tenderness to palpation No obvious deformities  limited ROM secondary to pain  Right medial anterior knee tender to palpation No obvious deformity Limited ROM due to pain   Neurological: She is alert and oriented to person, place, and  time.  Skin: Skin is warm and dry.  Psychiatric: She has a normal mood and affect. Her behavior is normal.    ED Course  Procedures (including critical care time) DIAGNOSTIC STUDIES: Oxygen Saturation is 99% on room air, normal by my interpretation.    COORDINATION OF CARE:  9:21 AM Discussed course of care with pt which includes right hip x-ray and perocet. Pt understands and agrees.    Labs Review Labs Reviewed - No data to display  Imaging Review Dg Hip Complete Right  09/23/2013   CLINICAL DATA:  Recent fall.  Right hip pain of 3 days duration.  EXAM: RIGHT HIP - COMPLETE 2+ VIEW  COMPARISON:  None.  FINDINGS: There is no evidence of hip fracture or dislocation. There is no evidence of arthropathy or other focal bone abnormality.  IMPRESSION: Negative.   Electronically Signed   By: Rolla Flatten M.D.   On: 09/23/2013 10:26     EKG Interpretation None      MDM   Final diagnoses:  Right hip pain    8:14 AM Patient's xrays unremarkable for acute changes. No neurovascular compromise. Vitals stable and patient afebrile. Patient will have Percocet for pain and advised to follow up with Orthopedics.   I personally performed the services described in this documentation, which was scribed in my presence. The recorded information has been reviewed and is accurate.   Alvina Chou, PA-C 09/24/13 [phone]

## 2013-09-23 NOTE — Discharge Instructions (Signed)
Take Percocet as needed for pain. Follow up with Dr. Percell Miller for further evaluation of your hip an knee pain. Refer to attached documents for more information.

## 2013-09-24 NOTE — ED Provider Notes (Signed)
Medical screening examination/treatment/procedure(s) were performed by non-physician practitioner and as supervising physician I was immediately available for consultation/collaboration.   EKG Interpretation None       Orlie Dakin, MD 09/24/13 1121

## 2013-09-29 ENCOUNTER — Other Ambulatory Visit: Payer: Self-pay | Admitting: Orthopaedic Surgery

## 2013-09-29 DIAGNOSIS — M25561 Pain in right knee: Secondary | ICD-10-CM

## 2013-10-06 ENCOUNTER — Ambulatory Visit
Admission: RE | Admit: 2013-10-06 | Discharge: 2013-10-06 | Disposition: A | Discharge status: Home / Self Care | Payer: Medicare Other | Source: Ambulatory Visit | Attending: Orthopaedic Surgery | Admitting: Orthopaedic Surgery

## 2013-10-06 ENCOUNTER — Other Ambulatory Visit: Payer: Self-pay | Admitting: Orthopaedic Surgery

## 2013-10-06 DIAGNOSIS — M25561 Pain in right knee: Secondary | ICD-10-CM

## 2013-10-15 ENCOUNTER — Ambulatory Visit
Admission: RE | Admit: 2013-10-15 | Discharge: 2013-10-15 | Disposition: A | Discharge status: Home / Self Care | Payer: Medicare Other | Source: Ambulatory Visit | Attending: Orthopaedic Surgery | Admitting: Orthopaedic Surgery

## 2013-10-15 DIAGNOSIS — M25561 Pain in right knee: Secondary | ICD-10-CM

## 2013-11-20 ENCOUNTER — Encounter (HOSPITAL_COMMUNITY): Payer: Self-pay | Admitting: Emergency Medicine

## 2013-11-20 ENCOUNTER — Emergency Department (HOSPITAL_COMMUNITY)
Admission: EM | Admit: 2013-11-20 | Discharge: 2013-11-20 | Disposition: A | Discharge status: Home / Self Care | Payer: Medicare Other | Attending: Emergency Medicine | Admitting: Emergency Medicine

## 2013-11-20 DIAGNOSIS — Z791 Long term (current) use of non-steroidal anti-inflammatories (NSAID): Secondary | ICD-10-CM | POA: Insufficient documentation

## 2013-11-20 DIAGNOSIS — Z862 Personal history of diseases of the blood and blood-forming organs and certain disorders involving the immune mechanism: Secondary | ICD-10-CM | POA: Insufficient documentation

## 2013-11-20 DIAGNOSIS — Z79899 Other long term (current) drug therapy: Secondary | ICD-10-CM | POA: Insufficient documentation

## 2013-11-20 DIAGNOSIS — E119 Type 2 diabetes mellitus without complications: Secondary | ICD-10-CM | POA: Insufficient documentation

## 2013-11-20 DIAGNOSIS — I10 Essential (primary) hypertension: Secondary | ICD-10-CM | POA: Insufficient documentation

## 2013-11-20 DIAGNOSIS — Z880 Allergy status to penicillin: Secondary | ICD-10-CM | POA: Insufficient documentation

## 2013-11-20 DIAGNOSIS — T829XXA Unspecified complication of cardiac and vascular prosthetic device, implant and graft, initial encounter: Secondary | ICD-10-CM

## 2013-11-20 DIAGNOSIS — Z7982 Long term (current) use of aspirin: Secondary | ICD-10-CM | POA: Insufficient documentation

## 2013-11-20 DIAGNOSIS — Y832 Surgical operation with anastomosis, bypass or graft as the cause of abnormal reaction of the patient, or of later complication, without mention of misadventure at the time of the procedure: Secondary | ICD-10-CM | POA: Insufficient documentation

## 2013-11-20 DIAGNOSIS — T82898A Other specified complication of vascular prosthetic devices, implants and grafts, initial encounter: Secondary | ICD-10-CM | POA: Insufficient documentation

## 2013-11-20 DIAGNOSIS — J45909 Unspecified asthma, uncomplicated: Secondary | ICD-10-CM | POA: Insufficient documentation

## 2013-11-20 DIAGNOSIS — I252 Old myocardial infarction: Secondary | ICD-10-CM | POA: Insufficient documentation

## 2013-11-20 NOTE — Discharge Instructions (Signed)
Dialysis Vascular Access Malfunction A vascular access is an entrance to your blood vessels that can be used for dialysis. A vascular access can be made in one of several ways:   Joining an artery to a vein under your skin to make a bigger blood vessel called a fistula.   Joining an artery to a vein under your skin using a soft tube called a graft.   Placing a thin, flexible tube (catheter) in a large vein, usually in your neck.  A vascular access may malfunction or become blocked.  WHAT CAN CAUSE YOUR VASCULAR ACCESS TO MALFUNCTION?  Infection (common).   A blood clot inside a part of the fistula, graft, or catheter. A blood clot can completely or partially block the flow of blood.   A kink in the graft or catheter.   A collection of blood (called a hematoma or bruise) next to the graft or catheter that pushes against it, blocking the flow of blood.  WHAT ARE SIGNS AND SYMPTOMS OF VASCULAR ACCESS MALFUNCTION?  There is a change in the vibration or pulse of your fistula or graft.  The vibration or pulse of your fistula or graft is gone.   There is new or unusual swelling of the area around the access.   There was an unsuccessful puncture of your access by the dialysis team.   The flow of blood through the fistula, graft, or catheter is too slow for effective dialysis.   When routine dialysis is completed and the needle is removed, bleeding lasts for too long a time.  WHAT HAPPENS IF MY VASCULAR ACCESS MALFUNCTIONS? Your health care provider may order blood work, cultures, or an X-ray test in order to learn what may be wrong with your vascular access. The X-ray test involves the injection of a liquid into the vascular access. The liquid shows up on the X-ray and allows your health care provider to see if there is a blockage in the vascular access.  Treatment varies depending on the cause of the malfunction:   If the vascular access is infected, your health care provider  may prescribe antibiotics to control the infection.   If a clot is found in the vascular access, you may need surgery to remove the clot.   If a blockage in the vascular access is due to some other cause (such as a kink in a graft), then you will likely need surgery to unblock or replace the graft.  HOME CARE INSTRUCTIONS: Follow up with your surgeon or other health care provider if you were instructed to do so. This is very important. Any delay in follow up could cause permanent dysfunction of the vascular access, which may be dangerous.  SEEK IMMEDIATE MEDICAL CARE IF:   Fever develops.  Recurrent bleeding develops; call 911 and apply direct pressure until bleeding stops.   New pain develops.  Pain, numbness, or an unusual pale skin color develops in the hand on the side of your vascular access.  You develop chest pain shortness of breath lightheadedness or other concerns. SEEK IMMEDIATE MEDICAL CARE IF: Unusual bleeding develops at the location of the vascular access. MAKE SURE YOU:  Understand these instructions.  Will watch your condition.  Will get help right away if you are not doing well or get worse. Document Released: 04/27/2006 Document Revised: 01/25/2013 Document Reviewed: 10/27/2012 [facility] Patient Information 2014 [place].

## 2013-11-20 NOTE — ED Notes (Signed)
+  thrill and bruit noted.

## 2013-11-20 NOTE — ED Notes (Signed)
Bleeding controlled at this time. Dressing intact with no bleed through.

## 2013-11-20 NOTE — ED Notes (Signed)
Pt alert x4 dressing applied to av shunt.

## 2013-11-20 NOTE — ED Notes (Addendum)
Pt arrived via EMS related to spontaneous bleeding from shunt. Bleeding controlled on ems arrival.

## 2013-11-20 NOTE — ED Notes (Signed)
PT monitored by pulse ox, and bp cuff.

## 2013-11-20 NOTE — ED Provider Notes (Signed)
CSN: [id number]     Arrival date & time 11/20/13  1724 History   First MD Initiated Contact with Patient 11/20/13 2048     Chief Complaint  Patient presents with  . Vascular Access Problem     (Consider location/radiation/quality/duration/timing/severity/associated sxs/prior Treatment) HPI Transient bleeding left arm fistula now resolved. No syncope no chest pain no shortness of breath no abdominal pain no vomiting no fever no redness around fistula sites no pus drainage from the fistula no bleeding any more after fistula bleeding was controlled with localized pressure, or next dialysis is scheduled for tomorrow, the patient's fistula site started bleeding today after she rubbed it after taking a shower and bleeding was controlled with local pressure without recurrence. Past Medical History  Diagnosis Date  . Renal disorder   . Hypertension   . Diabetes mellitus   . Asthma   . Anemia   . Myocardial infarction    Past Surgical History  Procedure Laterality Date  . Appendectomy    . Cholecystectomy    . Tubal ligation    . Hernia repair      X2  . Av fistula placement Left 12/03/2006  . Av fistula repair Left 11/25/2009  . Insertion of dialysis catheter  2010   History reviewed. No pertinent family history. History  Substance Use Topics  . Smoking status: Never Smoker   . Smokeless tobacco: Not on file  . Alcohol Use: No   OB History   Grav Para Term Preterm Abortions TAB SAB Ect Mult Living                 Review of Systems 10 Systems reviewed and are negative for acute change except as noted in the HPI.   Allergies  Sulfa antibiotics; Amoxicillin; Ibuprofen; and Naldecon senior  Home Medications   Prior to Admission medications   Medication Sig Start Date End Date Taking? Authorizing Provider  aspirin EC 81 MG tablet Take 81 mg by mouth every morning.   Yes Historical Provider, MD  calcium acetate (PHOSLO) 667 MG capsule Take 1,334-2,668 mg by mouth 3 (three)  times daily with meals. Takes 4 capsules three times daily before each meal.   Yes Historical Provider, MD  carvedilol (COREG) 12.5 MG tablet Take 12.5 mg by mouth 2 (two) times daily with a meal.   Yes Historical Provider, MD  diclofenac sodium (VOLTAREN) 1 % GEL Apply 4 g topically 4 (four) times daily.   Yes Historical Provider, MD  docusate sodium (COLACE) 100 MG capsule Take 100 mg by mouth 2 (two) times daily.   Yes Historical Provider, MD  isosorbide mononitrate (IMDUR) 30 MG 24 hr tablet Take 30 mg by mouth daily.   Yes Historical Provider, MD  levothyroxine (SYNTHROID, LEVOTHROID) 50 MCG tablet Take 50 mcg by mouth daily before breakfast.    Yes Historical Provider, MD  loperamide (IMODIUM) 2 MG capsule Take 2 mg by mouth as needed for diarrhea or loose stools.   Yes Historical Provider, MD  multivitamin (RENA-VIT) TABS tablet Take 1 tablet by mouth every morning.   Yes Historical Provider, MD  omeprazole (PRILOSEC) 20 MG capsule Take 20 mg by mouth 2 (two) times daily before a meal.   Yes Historical Provider, MD  prochlorperazine (COMPAZINE) 5 MG tablet Take 5 mg by mouth every 6 (six) hours as needed for nausea or vomiting.   Yes Historical Provider, MD  topiramate (TOPAMAX) 25 MG tablet Take 25 mg by mouth at bedtime.   Yes  Historical Provider, MD  traZODone (DESYREL) 50 MG tablet Take 25 mg by mouth at bedtime.    Yes Historical Provider, MD  Zinc 50 MG CAPS Take 1 capsule by mouth every morning.    Yes Historical Provider, MD   BP 124/48  Pulse 64  Temp(Src) 98.6 F (37 C) (Oral)  Resp 16  SpO2 98% Physical Exam  Nursing note and vitals reviewed. Constitutional:  Awake, alert, nontoxic appearance.  HENT:  Head: Atraumatic.  Eyes: Right eye exhibits no discharge. Left eye exhibits no discharge.  Neck: Neck supple.  Cardiovascular: Normal rate and regular rhythm.   No murmur heard. Pulmonary/Chest: Effort normal and breath sounds normal. No respiratory distress. She has no  wheezes. She has no rales. She exhibits no tenderness.  Abdominal: Soft. She exhibits no distension. There is no tenderness. There is no rebound.  Musculoskeletal: She exhibits no tenderness.  Baseline ROM, no obvious new focal weakness. Left arm radial pulse intact capillary refill less than 2 seconds normal light touch left hand good movement left hand left arm fistula site good thrill no active bleeding from prior puncture site where the patient had bleeding prior to arrival which is now resolved there is no surrounding cellulitis or hematoma or tenderness  Neurological:  Mental status and motor strength appears baseline for patient and situation.  Skin: No rash noted.  Psychiatric: She has a normal mood and affect.    ED Course  Procedures (including critical care time) Patient / Family / Caregiver informed of clinical course, understand medical decision-making process, and agree with plan.Pt stable in ED with no significant deterioration in condition. Labs Review Labs Reviewed - No data to display  Imaging Review No results found.   EKG Interpretation None      MDM   Final diagnoses:  Complication of arteriovenous dialysis fistula    I doubt any other EMC precluding discharge at this time including, but not necessarily limited to the following:uncontrolled bleeding, cellulitis.    Babette Relic, MD 11/23/13 1332

## 2013-12-01 ENCOUNTER — Emergency Department (HOSPITAL_COMMUNITY)
Admission: EM | Admit: 2013-12-01 | Discharge: 2013-12-01 | Disposition: A | Discharge status: Home / Self Care | Payer: Medicare Other | Attending: Emergency Medicine | Admitting: Emergency Medicine

## 2013-12-01 ENCOUNTER — Encounter (HOSPITAL_COMMUNITY): Payer: Self-pay | Admitting: Emergency Medicine

## 2013-12-01 DIAGNOSIS — T82898A Other specified complication of vascular prosthetic devices, implants and grafts, initial encounter: Secondary | ICD-10-CM | POA: Insufficient documentation

## 2013-12-01 DIAGNOSIS — Z7982 Long term (current) use of aspirin: Secondary | ICD-10-CM | POA: Insufficient documentation

## 2013-12-01 DIAGNOSIS — Y841 Kidney dialysis as the cause of abnormal reaction of the patient, or of later complication, without mention of misadventure at the time of the procedure: Secondary | ICD-10-CM | POA: Insufficient documentation

## 2013-12-01 DIAGNOSIS — Z862 Personal history of diseases of the blood and blood-forming organs and certain disorders involving the immune mechanism: Secondary | ICD-10-CM | POA: Insufficient documentation

## 2013-12-01 DIAGNOSIS — Z880 Allergy status to penicillin: Secondary | ICD-10-CM | POA: Insufficient documentation

## 2013-12-01 DIAGNOSIS — Z87448 Personal history of other diseases of urinary system: Secondary | ICD-10-CM | POA: Insufficient documentation

## 2013-12-01 DIAGNOSIS — E119 Type 2 diabetes mellitus without complications: Secondary | ICD-10-CM | POA: Insufficient documentation

## 2013-12-01 DIAGNOSIS — I252 Old myocardial infarction: Secondary | ICD-10-CM | POA: Insufficient documentation

## 2013-12-01 DIAGNOSIS — I10 Essential (primary) hypertension: Secondary | ICD-10-CM | POA: Insufficient documentation

## 2013-12-01 DIAGNOSIS — J45909 Unspecified asthma, uncomplicated: Secondary | ICD-10-CM | POA: Insufficient documentation

## 2013-12-01 DIAGNOSIS — Z79899 Other long term (current) drug therapy: Secondary | ICD-10-CM | POA: Insufficient documentation

## 2013-12-01 MED ORDER — ACETAMINOPHEN 325 MG PO TABS
650.0000 mg | ORAL_TABLET | Freq: Once | ORAL | Status: AC
  Administered 2013-12-01: 650 mg via ORAL
  Filled 2013-12-01: qty 2

## 2013-12-01 NOTE — ED Notes (Signed)
Dr. Wentz at the bedside.  

## 2013-12-01 NOTE — ED Provider Notes (Deleted)
Medical screening examination/treatment/procedure(s) were performed by non-physician practitioner and as supervising physician I was immediately available for consultation/collaboration.  Richarda Blade, MD 12/01/13 [phone]

## 2013-12-01 NOTE — ED Provider Notes (Signed)
CSN: [id number]     Arrival date & time 12/01/13  1835 History   First MD Initiated Contact with Patient 12/01/13 1847     Chief Complaint  Patient presents with  . Vascular Access Problem     (Consider location/radiation/quality/duration/timing/severity/associated sxs/prior Treatment) The history is provided by the patient and medical records.   This is a 63 y.o. F with PMH significant for HTN, DM, ESRD on HD (tues, thurs, sat) presenting to the ED for left AV fistula bleeding, onset this evening.  Pt states she was sitting down to eat dinner tonight around 1700 when she felt a wet sensation on her left arm and running down her shirt.  States fistula continued bleeding for another few minutes before EMS arrival.  States pressure dressing was applied which stopped bleeding.  She states this has happened multiple times over the past 3 weeks without known injury to trauma to arm.  She was supposed to be scheduled for an OP dialysis fistula scan, however was never ordered.  Fistula is currently being used for dialysis, last use was yesterday.  She did receive her full treatment.  No fevers or chills.  Past Medical History  Diagnosis Date  . Renal disorder   . Hypertension   . Diabetes mellitus   . Asthma   . Anemia   . Myocardial infarction    Past Surgical History  Procedure Laterality Date  . Appendectomy    . Cholecystectomy    . Tubal ligation    . Hernia repair      X2  . Av fistula placement Left 12/03/2006  . Av fistula repair Left 11/25/2009  . Insertion of dialysis catheter  2010   No family history on file. History  Substance Use Topics  . Smoking status: Never Smoker   . Smokeless tobacco: Not on file  . Alcohol Use: No   OB History   Grav Para Term Preterm Abortions TAB SAB Ect Mult Living                 Review of Systems  Hematological:       AV fistula bleeding  All other systems reviewed and are negative.     Allergies  Sulfa antibiotics;  Amoxicillin; Ibuprofen; and Naldecon senior  Home Medications   Prior to Admission medications   Medication Sig Start Date End Date Taking? Authorizing Provider  acetaminophen (TYLENOL) 325 MG tablet Take 650 mg by mouth every 6 (six) hours as needed.   Yes Historical Provider, MD  aspirin EC 81 MG tablet Take 81 mg by mouth every morning.   Yes Historical Provider, MD  calcium acetate (PHOSLO) 667 MG capsule Take 1,334-2,668 mg by mouth 3 (three) times daily with meals. Takes 4 capsules three times daily before each meal.   Yes Historical Provider, MD  carvedilol (COREG) 12.5 MG tablet Take 12.5 mg by mouth at bedtime.    Yes Historical Provider, MD  diclofenac sodium (VOLTAREN) 1 % GEL Apply 4 g topically 4 (four) times daily.   Yes Historical Provider, MD  docusate sodium (COLACE) 100 MG capsule Take 100 mg by mouth 2 (two) times daily.   Yes Historical Provider, MD  isosorbide mononitrate (IMDUR) 30 MG 24 hr tablet Take 30 mg by mouth daily.   Yes Historical Provider, MD  levothyroxine (SYNTHROID, LEVOTHROID) 50 MCG tablet Take 50 mcg by mouth daily before breakfast.    Yes Historical Provider, MD  loperamide (IMODIUM) 2 MG capsule Take 2 mg by  mouth as needed for diarrhea or loose stools.   Yes Historical Provider, MD  multivitamin (RENA-VIT) TABS tablet Take 1 tablet by mouth every morning.   Yes Historical Provider, MD  omeprazole (PRILOSEC) 20 MG capsule Take 20 mg by mouth 2 (two) times daily before a meal.   Yes Historical Provider, MD  prochlorperazine (COMPAZINE) 5 MG tablet Take 5 mg by mouth every 6 (six) hours as needed for nausea or vomiting.   Yes Historical Provider, MD  topiramate (TOPAMAX) 25 MG tablet Take 25 mg by mouth at bedtime.   Yes Historical Provider, MD  traMADol (ULTRAM) 50 MG tablet Take 50 mg by mouth every 6 (six) hours as needed.   Yes Historical Provider, MD  traZODone (DESYREL) 50 MG tablet Take 25 mg by mouth at bedtime.    Yes Historical Provider, MD  Zinc  50 MG CAPS Take 1 capsule by mouth every morning.    Yes Historical Provider, MD   BP 195/92  Pulse 85  Temp(Src) 98 F (36.7 C) (Oral)  Resp 16  SpO2 97%  Physical Exam  Nursing note and vitals reviewed. Constitutional: She is oriented to person, place, and time. She appears well-developed and well-nourished.  HENT:  Head: Normocephalic and atraumatic.  Mouth/Throat: Oropharynx is clear and moist.  Eyes: Conjunctivae and EOM are normal. Pupils are equal, round, and reactive to light.  Neck: Normal range of motion.  Cardiovascular: Normal rate, regular rhythm and normal heart sounds.   Pulmonary/Chest: Effort normal and breath sounds normal. No respiratory distress. She has no wheezes.  Musculoskeletal: Normal range of motion.  LUE with AV fistula in place with small opening present; strong thrill present; no active bleeding or drainage; no surrounding erythema, warmth to touch, or cellulitic change  Neurological: She is alert and oriented to person, place, and time.  Skin: Skin is warm and dry.  Psychiatric: She has a normal mood and affect.    ED Course  Procedures (including critical care time) Labs Review Labs Reviewed - No data to display  Imaging Review No results found.   EKG Interpretation None      MDM   Final diagnoses:  Other complications due to renal dialysis device, implant, and graft   63 y.o. F on HD with 3rd episode of spontaneous bleeding from her LUE dialysis fistula over the past 3 weeks.  On exam, fistula has stopped bleeding, strong thrill remains present. No signs of infection present. Case was dicussed with nephrology, Dr. Augustin Coupe who has evaluated pt in the ED-- will see pt in office on Monday morning for fistula angiogram and possible angioplasty.  Pt will resume dialysis tomorrow as previously scheduled, given bleeding return precautions.  Discussed plan with patient, he/she acknowledged understanding and agreed with plan of care.  Return precautions  given for new or worsening symptoms.  Larene Pickett, PA-C 12/01/13 2228

## 2013-12-01 NOTE — ED Notes (Addendum)
Pt arrived by ems from home with c/o shunt in upper L arm bleeding. Pt approx lost 50cc of blood. Bleeding is currently controlled by ems splint. Pt denies pain. Pt has been seen here 2 times in recent weeks for same and states Dr. Lenna Sciara Patel's office was supposed to contact her for follow up but she has not heard anything.  Pt takes baby asaprin qd. Skin warm and dry. Pt conscious alert and oriented x4.

## 2013-12-01 NOTE — ED Notes (Signed)
Patient has a Dialysis Catheter bleeding from her left arm. The catheter is wrapped on no blood noted on the dressing. Patient states this has happened three times in the last couple months. Patient made aware of the plan of care. Pulses are present distal to the site of bleeding and the arm is warm to touch.

## 2013-12-01 NOTE — ED Provider Notes (Signed)
  Face-to-face evaluation   History: She has had several episodes of bleeding from her left arm fistula. There is a defect in the skin over her fistula.  Physical exam: Left upper arm has a fistula. That has been used recently. There is a small area about 2 x 3 mm it is open, over the middle aspect of the fistula. There is no bleeding from this site. There is a normal pulsation of the fistula.  Medical screening examination/treatment/procedure(s) were conducted as a shared visit with non-physician practitioner(s) and myself.  I personally evaluated the patient during the encounter  Richarda Blade, MD 12/03/13 [phone]

## 2013-12-01 NOTE — Discharge Instructions (Signed)
Continue your dialysis tomorrow as scheduled. Follow-up with Dr. Augustin Coupe on Monday as scheduled. Return to the ED for new concerns.

## 2013-12-01 NOTE — ED Notes (Signed)
Spoke with [place], [place]. Stated, "I will be in there soon to discharge patient."

## 2013-12-01 NOTE — ED Notes (Signed)
Lattie Haw, PA at the bedside.

## 2013-12-01 NOTE — ED Notes (Signed)
NT at the bedside.

## 2013-12-04 ENCOUNTER — Encounter (HOSPITAL_COMMUNITY)
Admission: EM | Disposition: A | Discharge status: Home / Self Care | Payer: Self-pay | Source: Home / Self Care | Attending: Emergency Medicine

## 2013-12-04 ENCOUNTER — Ambulatory Visit (HOSPITAL_COMMUNITY)
Admission: EM | Admit: 2013-12-04 | Discharge: 2013-12-04 | Disposition: A | Discharge status: Home / Self Care | Payer: Medicare Other | Attending: Emergency Medicine | Admitting: Emergency Medicine

## 2013-12-04 ENCOUNTER — Encounter (HOSPITAL_COMMUNITY): Payer: Medicare Other | Admitting: Anesthesiology

## 2013-12-04 ENCOUNTER — Encounter (HOSPITAL_COMMUNITY): Payer: Self-pay | Admitting: Emergency Medicine

## 2013-12-04 ENCOUNTER — Emergency Department (HOSPITAL_COMMUNITY): Payer: Medicare Other | Admitting: Anesthesiology

## 2013-12-04 DIAGNOSIS — I120 Hypertensive chronic kidney disease with stage 5 chronic kidney disease or end stage renal disease: Secondary | ICD-10-CM | POA: Insufficient documentation

## 2013-12-04 DIAGNOSIS — Z992 Dependence on renal dialysis: Secondary | ICD-10-CM | POA: Insufficient documentation

## 2013-12-04 DIAGNOSIS — E119 Type 2 diabetes mellitus without complications: Secondary | ICD-10-CM | POA: Insufficient documentation

## 2013-12-04 DIAGNOSIS — D649 Anemia, unspecified: Secondary | ICD-10-CM | POA: Insufficient documentation

## 2013-12-04 DIAGNOSIS — I2510 Atherosclerotic heart disease of native coronary artery without angina pectoris: Secondary | ICD-10-CM | POA: Insufficient documentation

## 2013-12-04 DIAGNOSIS — T82898A Other specified complication of vascular prosthetic devices, implants and grafts, initial encounter: Secondary | ICD-10-CM

## 2013-12-04 DIAGNOSIS — N186 End stage renal disease: Secondary | ICD-10-CM | POA: Insufficient documentation

## 2013-12-04 DIAGNOSIS — Z7982 Long term (current) use of aspirin: Secondary | ICD-10-CM | POA: Insufficient documentation

## 2013-12-04 DIAGNOSIS — Y832 Surgical operation with anastomosis, bypass or graft as the cause of abnormal reaction of the patient, or of later complication, without mention of misadventure at the time of the procedure: Secondary | ICD-10-CM | POA: Insufficient documentation

## 2013-12-04 DIAGNOSIS — J45909 Unspecified asthma, uncomplicated: Secondary | ICD-10-CM | POA: Insufficient documentation

## 2013-12-04 DIAGNOSIS — I252 Old myocardial infarction: Secondary | ICD-10-CM | POA: Insufficient documentation

## 2013-12-04 HISTORY — PX: REVISON OF ARTERIOVENOUS FISTULA: SHX6074

## 2013-12-04 LAB — GLUCOSE, CAPILLARY: GLUCOSE-CAPILLARY: 140 mg/dL — AB (ref 70–99)

## 2013-12-04 LAB — POCT I-STAT 4, (NA,K, GLUC, HGB,HCT)
Glucose, Bld: 105 mg/dL — ABNORMAL HIGH (ref 70–99)
HEMATOCRIT: 35 % — AB (ref 36.0–46.0)
Hemoglobin: 11.9 g/dL — ABNORMAL LOW (ref 12.0–15.0)
Potassium: 4.5 mEq/L (ref 3.7–5.3)
Sodium: 137 mEq/L (ref 137–147)

## 2013-12-04 SURGERY — REVISON OF ARTERIOVENOUS FISTULA
Anesthesia: Monitor Anesthesia Care | Site: Arm Upper | Laterality: Left

## 2013-12-04 MED ORDER — HEPARIN SODIUM (PORCINE) 5000 UNIT/ML IJ SOLN
INTRAMUSCULAR | Status: DC | PRN
  Administered 2013-12-04: 14:00:00

## 2013-12-04 MED ORDER — FENTANYL CITRATE 0.05 MG/ML IJ SOLN: 25.0000 ug | INTRAMUSCULAR | Status: DC | PRN

## 2013-12-04 MED ORDER — LIDOCAINE-EPINEPHRINE 0.5 %-1:200000 IJ SOLN
INTRAMUSCULAR | Status: AC
  Filled 2013-12-04: qty 1

## 2013-12-04 MED ORDER — ONDANSETRON HCL 4 MG/2ML IJ SOLN
INTRAMUSCULAR | Status: DC | PRN
  Administered 2013-12-04: 4 mg via INTRAVENOUS

## 2013-12-04 MED ORDER — LIDOCAINE HCL (CARDIAC) 20 MG/ML IV SOLN
INTRAVENOUS | Status: AC
  Filled 2013-12-04: qty 5

## 2013-12-04 MED ORDER — SODIUM CHLORIDE 0.9 % IV SOLN
INTRAVENOUS | Status: DC | PRN
  Administered 2013-12-04: 14:00:00 via INTRAVENOUS

## 2013-12-04 MED ORDER — SODIUM CHLORIDE 0.9 % IR SOLN
Status: DC | PRN
  Administered 2013-12-04: 1000 mL

## 2013-12-04 MED ORDER — SODIUM CHLORIDE 0.9 % IV SOLN
Freq: Once | INTRAVENOUS | Status: AC
  Administered 2013-12-04: 14:00:00 via INTRAVENOUS

## 2013-12-04 MED ORDER — LIDOCAINE-EPINEPHRINE 0.5 %-1:200000 IJ SOLN
INTRAMUSCULAR | Status: DC | PRN
  Administered 2013-12-04: 50 mL

## 2013-12-04 MED ORDER — METOCLOPRAMIDE HCL 5 MG/ML IJ SOLN: 10.0000 mg | Freq: Once | INTRAMUSCULAR | Status: DC | PRN

## 2013-12-04 MED ORDER — MIDAZOLAM HCL 2 MG/2ML IJ SOLN
INTRAMUSCULAR | Status: AC
  Filled 2013-12-04: qty 2

## 2013-12-04 MED ORDER — FENTANYL CITRATE 0.05 MG/ML IJ SOLN
INTRAMUSCULAR | Status: DC | PRN
  Administered 2013-12-04 (×3): 50 ug via INTRAVENOUS

## 2013-12-04 MED ORDER — MIDAZOLAM HCL 5 MG/5ML IJ SOLN
INTRAMUSCULAR | Status: DC | PRN
  Administered 2013-12-04 (×2): 1 mg via INTRAVENOUS

## 2013-12-04 MED ORDER — FENTANYL CITRATE 0.05 MG/ML IJ SOLN
INTRAMUSCULAR | Status: AC
  Filled 2013-12-04: qty 5

## 2013-12-04 MED ORDER — VANCOMYCIN HCL IN DEXTROSE 1-5 GM/200ML-% IV SOLN
1000.0000 mg | INTRAVENOUS | Status: AC
  Administered 2013-12-04: 1000 mg via INTRAVENOUS
  Filled 2013-12-04: qty 200

## 2013-12-04 MED ORDER — OXYCODONE-ACETAMINOPHEN 5-325 MG PO TABS
1.0000 | ORAL_TABLET | Freq: Four times a day (QID) | ORAL | Status: DC | PRN
Start: 2013-12-04 — End: 2014-05-14

## 2013-12-04 SURGICAL SUPPLY — 44 items
BENZOIN TINCTURE PRP APPL 2/3 (GAUZE/BANDAGES/DRESSINGS) ×3 IMPLANT
BLADE 10 SAFETY STRL DISP (BLADE) IMPLANT
CANISTER SUCTION 2500CC (MISCELLANEOUS) ×3 IMPLANT
CLIP LIGATING EXTRA MED SLVR (CLIP) ×3 IMPLANT
CLIP LIGATING EXTRA SM BLUE (MISCELLANEOUS) ×3 IMPLANT
CLOSURE WOUND 1/2 X4 (GAUZE/BANDAGES/DRESSINGS) ×1
COVER PROBE W GEL 5X96 (DRAPES) IMPLANT
COVER SURGICAL LIGHT HANDLE (MISCELLANEOUS) ×3 IMPLANT
DECANTER SPIKE VIAL GLASS SM (MISCELLANEOUS) ×3 IMPLANT
ELECT REM PT RETURN 9FT ADLT (ELECTROSURGICAL) ×3
ELECTRODE REM PT RTRN 9FT ADLT (ELECTROSURGICAL) ×1 IMPLANT
GAUZE SPONGE 2X2 8PLY STRL LF (GAUZE/BANDAGES/DRESSINGS) ×1 IMPLANT
GEL ULTRASOUND 20GR AQUASONIC (MISCELLANEOUS) IMPLANT
GLOVE BIO SURGEON STRL SZ 6.5 (GLOVE) ×2 IMPLANT
GLOVE BIO SURGEONS STRL SZ 6.5 (GLOVE) ×1
GLOVE BIOGEL PI IND STRL 6.5 (GLOVE) ×3 IMPLANT
GLOVE BIOGEL PI IND STRL 7.0 (GLOVE) ×1 IMPLANT
GLOVE BIOGEL PI IND STRL 7.5 (GLOVE) ×1 IMPLANT
GLOVE BIOGEL PI INDICATOR 6.5 (GLOVE) ×6
GLOVE BIOGEL PI INDICATOR 7.0 (GLOVE) ×2
GLOVE BIOGEL PI INDICATOR 7.5 (GLOVE) ×2
GLOVE ECLIPSE 6.5 STRL STRAW (GLOVE) ×3 IMPLANT
GLOVE SS BIOGEL STRL SZ 7 (GLOVE) ×1 IMPLANT
GLOVE SS BIOGEL STRL SZ 7.5 (GLOVE) ×1 IMPLANT
GLOVE SUPERSENSE BIOGEL SZ 7 (GLOVE) ×2
GLOVE SUPERSENSE BIOGEL SZ 7.5 (GLOVE) ×2
GOWN STRL REUS W/ TWL LRG LVL3 (GOWN DISPOSABLE) ×4 IMPLANT
GOWN STRL REUS W/TWL LRG LVL3 (GOWN DISPOSABLE) ×8
KIT BASIN OR (CUSTOM PROCEDURE TRAY) ×3 IMPLANT
KIT ROOM TURNOVER OR (KITS) ×3 IMPLANT
NS IRRIG 1000ML POUR BTL (IV SOLUTION) ×3 IMPLANT
PACK CV ACCESS (CUSTOM PROCEDURE TRAY) ×3 IMPLANT
PAD ARMBOARD 7.5X6 YLW CONV (MISCELLANEOUS) ×6 IMPLANT
SPONGE GAUZE 2X2 STER 10/PKG (GAUZE/BANDAGES/DRESSINGS) ×2
SPONGE GAUZE 4X4 12PLY (GAUZE/BANDAGES/DRESSINGS) ×3 IMPLANT
STRIP CLOSURE SKIN 1/2X4 (GAUZE/BANDAGES/DRESSINGS) ×2 IMPLANT
SUT PROLENE 6 0 CC (SUTURE) ×6 IMPLANT
SUT VIC AB 3-0 SH 27 (SUTURE) ×2
SUT VIC AB 3-0 SH 27X BRD (SUTURE) ×1 IMPLANT
TAPE CLOTH SURG 4X10 WHT LF (GAUZE/BANDAGES/DRESSINGS) ×3 IMPLANT
TOWEL OR 17X24 6PK STRL BLUE (TOWEL DISPOSABLE) ×3 IMPLANT
TOWEL OR 17X26 10 PK STRL BLUE (TOWEL DISPOSABLE) ×3 IMPLANT
UNDERPAD 30X30 INCONTINENT (UNDERPADS AND DIAPERS) ×3 IMPLANT
WATER STERILE IRR 1000ML POUR (IV SOLUTION) ×3 IMPLANT

## 2013-12-04 NOTE — ED Notes (Signed)
Denies c/o pain except for arm where fistula is, denies chest pain and sob

## 2013-12-04 NOTE — ED Provider Notes (Signed)
CSN: [id number]     Arrival date & time 12/04/13  [id number] History   First MD Initiated Contact with Patient 12/04/13 [phone]     Chief Complaint  Patient presents with  . To see Dr Donnetta Hutching    . Arm Pain     (Consider location/radiation/quality/duration/timing/severity/associated sxs/prior Treatment) HPI Comments: 63 yo female with hx of left arm dialysis fistula who presents from vascular surgery office secondary to left arm fistula problem.  Her problems started as bleeding from fistula site.  She has had four visits to evaluate this bleeding.  Today, she went to the vascular surgery office and it started bleeding when it was being examined.  It stopped after the Maricela Schreur stitched it.  Patient reports that they performed an angiogram and were referred to the secondary to these results. She does not know what the results were. Bleeding stopped.   Past Medical History  Diagnosis Date  . Renal disorder   . Hypertension   . Diabetes mellitus   . Asthma   . Anemia   . Myocardial infarction    Past Surgical History  Procedure Laterality Date  . Appendectomy    . Cholecystectomy    . Tubal ligation    . Hernia repair      X2  . Av fistula placement Left 12/03/2006  . Av fistula repair Left 11/25/2009  . Insertion of dialysis catheter  2010   No family history on file. History  Substance Use Topics  . Smoking status: Never Smoker   . Smokeless tobacco: Not on file  . Alcohol Use: No   OB History   Grav Para Term Preterm Abortions TAB SAB Ect Mult Living                 Review of Systems  All other systems reviewed and are negative.     Allergies  Sulfa antibiotics; Amoxicillin; Ibuprofen; and Naldecon senior  Home Medications   Prior to Admission medications   Medication Sig Start Date End Date Taking? Authorizing Ensley Blas  acetaminophen (TYLENOL) 325 MG tablet Take 650 mg by mouth every 6 (six) hours as needed.    Historical Zyion Doxtater, MD  aspirin EC 81 MG tablet Take 81  mg by mouth every morning.    Historical Vineet Kinney, MD  calcium acetate (PHOSLO) 667 MG capsule Take 1,334-2,668 mg by mouth 3 (three) times daily with meals. Takes 4 capsules three times daily before each meal.    Historical Vinetta Brach, MD  carvedilol (COREG) 12.5 MG tablet Take 12.5 mg by mouth at bedtime.     Historical Araina Butrick, MD  diclofenac sodium (VOLTAREN) 1 % GEL Apply 4 g topically 4 (four) times daily.    Historical Lasasha Brophy, MD  docusate sodium (COLACE) 100 MG capsule Take 100 mg by mouth 2 (two) times daily.    Historical Jessie Cowher, MD  isosorbide mononitrate (IMDUR) 30 MG 24 hr tablet Take 30 mg by mouth daily.    Historical Iyan Flett, MD  levothyroxine (SYNTHROID, LEVOTHROID) 50 MCG tablet Take 50 mcg by mouth daily before breakfast.     Historical Ryot Burrous, MD  loperamide (IMODIUM) 2 MG capsule Take 2 mg by mouth as needed for diarrhea or loose stools.    Historical Esty Ahuja, MD  multivitamin (RENA-VIT) TABS tablet Take 1 tablet by mouth every morning.    Historical Katalina Magri, MD  omeprazole (PRILOSEC) 20 MG capsule Take 20 mg by mouth 2 (two) times daily before a meal.    Historical Cherese Lozano, MD  prochlorperazine (COMPAZINE)  5 MG tablet Take 5 mg by mouth every 6 (six) hours as needed for nausea or vomiting.    Historical Sarina Robleto, MD  topiramate (TOPAMAX) 25 MG tablet Take 25 mg by mouth at bedtime.    Historical Latrisa Hellums, MD  traMADol (ULTRAM) 50 MG tablet Take 50 mg by mouth every 6 (six) hours as needed.    Historical Willy Pinkerton, MD  traZODone (DESYREL) 50 MG tablet Take 25 mg by mouth at bedtime.     Historical Onie Hayashi, MD  Zinc 50 MG CAPS Take 1 capsule by mouth every morning.     Historical Lataya Varnell, MD   BP 128/55  Pulse 74  Temp(Src) 98.1 F (36.7 C) (Oral)  SpO2 97% Physical Exam  Nursing note and vitals reviewed. Constitutional: She is oriented to person, place, and time. She appears well-developed and well-nourished. No distress.  HENT:  Head: Normocephalic and  atraumatic.  Eyes: Conjunctivae are normal. No scleral icterus.  Neck: Neck supple.  Cardiovascular: Normal rate and intact distal pulses.   Pulmonary/Chest: Effort normal. No stridor. No respiratory distress.  Abdominal: Normal appearance. She exhibits no distension.  Musculoskeletal:  Left arm, large fistula, good thrill, distal pulses intact.  Neurological: She is alert and oriented to person, place, and time.  Skin: Skin is warm and dry. No rash noted.  Psychiatric: She has a normal mood and affect. Her behavior is normal.    ED Course  Procedures (including critical care time) Labs Review Labs Reviewed - No data to display  Imaging Review No results found.   EKG Interpretation None      MDM   Final diagnoses:  Other complications due to renal dialysis device, implant, and graft    Taken to OR with Dr. Donnetta Hutching for revision of fistula    Houston Siren III, MD 12/04/13 1818

## 2013-12-04 NOTE — ED Notes (Addendum)
Preparing to transport pt to surgery; bay 37 in surgical area/report given on unit

## 2013-12-04 NOTE — Op Note (Signed)
    OPERATIVE REPORT  DATE OF SURGERY: 12/04/2013  PATIENT: Roberta Bryant, 63 y.o. female MRN: [id number]  DOB: June 27, 1950  PRE-OPERATIVE DIAGNOSIS: End-stage renal disease with bleeding from left upper arm AV fistula  POST-OPERATIVE DIAGNOSIS:  Same  PROCEDURE: Exploration of left upper arm fistula and repair of bleeding from vein with closure of the vein and overlying tissue  SURGEON:  Curt Jews, M.D.  PHYSICIAN ASSISTANT: Collins  ANESTHESIA:  Local with sedation  EBL: Minimal ml  Total I/O In: 100 [I.V.:100] Out: -   BLOOD ADMINISTERED: None  DRAINS: None  SPECIMEN: None  COUNTS CORRECT:  YES  PLAN OF CARE: PACU   PATIENT DISPOSITION:  PACU - hemodynamically stable  PROCEDURE DETAILS: Patient's emergency room today after being evaluated at the outpatient access center. As a long-standing left upper arm AV fistula. She's had 4 recent bleeding episodes 2 of which she presented to the emergency department. These have all been controlled with pressure. Today a stitch was required for control of hemostasis and she was directly to the emergency room for vascular evaluation. The patient does have a buttonhole technique access site and has had multiple bleeding from this. Recommend that she undergo surgical exploration and repair of the vein and closure.  The patient was taken up replacing that is where the area of the left arm AV sterile fashion. On manipulation of the buttonhole there was arterial bleeding from this. Pressure was held for hemostasis and the vein was fistula was held proximally for control. An ellipse of skin was removed around the buttonhole and this was carried to the subcutaneous tissue with sharp dissection. Bleeding was controlled with electrocautery. The area of extension down to the vein was controlled and was occluded with a vascular clamp. The buttonhole was excised. The resulting deficit in the vein was closed with several layers of 6-0 Prolene  suture. Clamps removed and a good hemostasis was encountered. The wound irrigated with saline and hemostasis electrocautery. The subcutaneous tissue was mobilized to allow closure the skin without tension. The skin was closed in 2 layers of 3-0 Vicryl in the subcutaneous and subcuticular tissue. Sterile dressing was applied and taken to the recovery room in stable condition   Curt Jews, M.D. 12/04/2013 3:05 PM

## 2013-12-04 NOTE — Consult Note (Signed)
[facility] NOTE   MRN : [id number]  Reason for Consult: AV fistula bleeding  Referring Physician: Dr. Skeet Latch  History of Present Illness: 63 y/o female with a history of several episodes of bleeding at the distal " button hole" site.  The last bleeding episode was 12/01/2013 and earlier on 11/20/2013.  She was sent to the ED by Dr. Augustin Coupe after he sutured the " button hole" site closed.  Since this has happened multiple times we are taking her to the OR today by Dr. Donnetta Hutching for revision of fistula.  Past medical history includes: DM, hypertension treated with Coreg,  CAD with history of MI.       Current Facility-Administered Medications  Medication Dose Route Frequency Denishia Citro Last Rate Last Dose  . [START ON 12/05/2013] vancomycin (VANCOCIN) IVPB 1000 mg/200 mL premix  1,000 mg Intravenous To OR Ulyses Amor, PA-C       Current Outpatient Prescriptions  Medication Sig Dispense Refill  . acetaminophen (TYLENOL) 325 MG tablet Take 650 mg by mouth every 6 (six) hours as needed.      Marland Kitchen aspirin EC 81 MG tablet Take 81 mg by mouth every morning.      . calcium acetate (PHOSLO) 667 MG capsule Take 1,334-2,668 mg by mouth 3 (three) times daily with meals. Takes 4 capsules three times daily before each meal.      . carvedilol (COREG) 12.5 MG tablet Take 12.5 mg by mouth at bedtime.       . diclofenac sodium (VOLTAREN) 1 % GEL Apply 4 g topically 4 (four) times daily.      Marland Kitchen docusate sodium (COLACE) 100 MG capsule Take 100 mg by mouth 2 (two) times daily.      . isosorbide mononitrate (IMDUR) 30 MG 24 hr tablet Take 30 mg by mouth daily.      Marland Kitchen levothyroxine (SYNTHROID, LEVOTHROID) 50 MCG tablet Take 50 mcg by mouth daily before breakfast.       . loperamide (IMODIUM) 2 MG capsule Take 2 mg by mouth as needed for diarrhea or loose stools.      . multivitamin (RENA-VIT) TABS tablet Take 1 tablet by mouth every morning.      Marland Kitchen omeprazole (PRILOSEC) 20 MG capsule  Take 20 mg by mouth 2 (two) times daily before a meal.      . prochlorperazine (COMPAZINE) 5 MG tablet Take 5 mg by mouth every 6 (six) hours as needed for nausea or vomiting.      . topiramate (TOPAMAX) 25 MG tablet Take 25 mg by mouth at bedtime.      . traMADol (ULTRAM) 50 MG tablet Take 50 mg by mouth every 6 (six) hours as needed.      . traZODone (DESYREL) 50 MG tablet Take 25 mg by mouth at bedtime.       . Zinc 50 MG CAPS Take 1 capsule by mouth every morning.         Pt meds include: Statin :No Betablocker: Yes ASA: Yes Other anticoagulants/antiplatelets: None  Past Medical History  Diagnosis Date  . Renal disorder   . Hypertension   . Diabetes mellitus   . Asthma   . Anemia   . Myocardial infarction     Past Surgical History  Procedure Laterality Date  . Appendectomy    . Cholecystectomy    . Tubal ligation    . Hernia repair      X2  . Av fistula  placement Left 12/03/2006  . Av fistula repair Left 11/25/2009  . Insertion of dialysis catheter  2010    Social History History  Substance Use Topics  . Smoking status: Never Smoker   . Smokeless tobacco: Not on file  . Alcohol Use: No    Family History Family history of DM and hypertension   Allergies  Allergen Reactions  . Sulfa Antibiotics Anaphylaxis  . Amoxicillin Hives  . Ibuprofen Hives  . Naldecon Senior [Guaifenesin] Hives     REVIEW OF SYSTEMS  General: [ ]  Weight loss, [ ]  Fever, [ ]  chills Neurologic: [ ]  Dizziness, [ ]  Blackouts, [ ]  Seizure [ ]  Stroke, [ ]  "Mini stroke", [ ]  Slurred speech, [ ]  Temporary blindness; [ ]  weakness in arms or legs, [ ]  Hoarseness [ ]  Dysphagia Cardiac: [ ]  Chest pain/pressure, [ ]  Shortness of breath at rest [ ]  Shortness of breath with exertion, [ ]  Atrial fibrillation or irregular heartbeat  Vascular: [ ]  Pain in legs with walking, [ ]  Pain in legs at rest, [ ]  Pain in legs at night,  [ ]  Non-healing ulcer, [ ]  Blood clot in vein/DVT,   Pulmonary: [ ]   Home oxygen, [ ]  Productive cough, [ ]  Coughing up blood, [ ]  Asthma,  [ ]  Wheezing [ ]  COPD Musculoskeletal:  [ ]  Arthritis, [ ]  Low back pain, [ ]  Joint pain Hematologic: [ ]  Easy Bruising, [x ] Anemia; [ ]  Hepatitis Gastrointestinal: [ ]  Blood in stool, [ ]  Gastroesophageal Reflux/heartburn, Urinary: [x ] chronic Kidney disease, [ ]  on HD - [x ] MWF or [ ]  TTHS, [ ]  Burning with urination, [ ]  Difficulty urinating Skin: [ ]  Rashes, [ ]  Wounds Psychological: [ ]  Anxiety, [ ]  Depression  Physical Examination Filed Vitals:   12/04/13 0856  BP: 128/55  Pulse: 74  Temp: 98.1 F (36.7 C)  TempSrc: Oral  SpO2: 97%   There is no weight on file to calculate BMI.  General:  WDWN in NAD HENT: WNL Eyes: Pupils equal Pulmonary: normal non-labored breathing , without Rales, rhonchi,  wheezing Cardiac: RRR, without  Murmurs, rubs or gallops; Abdomen: soft, NT, no masses Skin: no rashes, ulcers noted;  no Gangrene , no cellulitis; no open wounds;   Vascular Exam/Pulses:Palpable thrill and radial pulses distally left upper extremity. No active bleeding currently on exam.   Musculoskeletal: no muscle wasting or atrophy; no edema  Neurologic: A&O X 3; Appropriate Affect ;  SENSATION: normal; MOTOR FUNCTION: 5/5 Symmetric Speech is fluent/normal   Significant Diagnostic Studies: CBC Lab Results  Component Value Date   WBC 6.8 06/10/2013   HGB 11.4* 06/10/2013   HCT 34.2* 06/10/2013   MCV 105.6* 06/10/2013   PLT 186 06/10/2013    BMET    Component Value Date/Time   NA 136* 06/10/2013 2352   K 4.8 06/10/2013 2352   CL 96 06/10/2013 2352   CO2 27 06/10/2013 2352   GLUCOSE 136* 06/10/2013 2352   BUN 37* 06/10/2013 2352   CREATININE 4.28* 06/10/2013 2352   CALCIUM 10.1 06/10/2013 2352   CALCIUM 7.4* 09/12/2008 0615   GFRNONAA 10* 06/10/2013 2352   GFRAA 12* 06/10/2013 2352   The CrCl is unknown because both a height and weight (above a minimum accepted value) are required for this  calculation.  COAG Lab Results  Component Value Date   INR 1.1 09/07/2008   INR 1.0 12/03/2006     Non-Invasive Vascular Imaging: N/A  ASSESSMENT:  AV fistula bleeding secondary to button hole ulceration.  Plan: revision of AV fistula By Dr. Sherren Mocha Early.  The patient was examined in the ED by Dr. Donnetta Hutching today.  She last ate this am at 8 am.      Laurence Slate [facility] 12/04/2013 10:23 AM  I have examined the patient, reviewed and agree with above. Is now had 4 different episodes of bleeding from the same site. Had a suture placed at the out patient access services today recommended a surgical revisionto prevent further bleeding.  EARLY, TODD, MD 12/04/2013 1:36 PM

## 2013-12-04 NOTE — Transfer of Care (Signed)
Immediate Anesthesia Transfer of Care Note  Patient: Roberta Bryant  Procedure(s) Performed: Procedure(s): REVISON OF ARTERIOVENOUS FISTULA (Left)  Patient Location: PACU  Anesthesia Type:MAC  Level of Consciousness: awake, alert , oriented and patient cooperative  Airway & Oxygen Therapy: Patient Spontanous Breathing  Post-op Assessment: Report given to PACU RN, Post -op Vital signs reviewed and stable and Patient moving all extremities  Post vital signs: Reviewed and stable  Complications: No apparent anesthesia complications

## 2013-12-04 NOTE — ED Notes (Signed)
Pt reports sent here to be seen by Dr Early regarding left arm access issue. Pt seen here Friday for bleeding at site. Pt seen at Dr office where site began to bleed again. Dr Early paged to be notified that pt is here.

## 2013-12-04 NOTE — H&P (View-Only) (Signed)
[facility] NOTE   MRN : [id number]  Reason for Consult: AV fistula bleeding  Referring Physician: Dr. Skeet Latch  History of Present Illness: 63 y/o female with a history of several episodes of bleeding at the distal " button hole" site.  The last bleeding episode was 12/01/2013 and earlier on 11/20/2013.  She was sent to the ED by Dr. Augustin Coupe after he sutured the " button hole" site closed.  Since this has happened multiple times we are taking her to the OR today by Dr. Donnetta Hutching for revision of fistula.  Past medical history includes: DM, hypertension treated with Coreg,  CAD with history of MI.       Current Facility-Administered Medications  Medication Dose Route Frequency Provider Last Rate Last Dose  . [START ON 12/05/2013] vancomycin (VANCOCIN) IVPB 1000 mg/200 mL premix  1,000 mg Intravenous To OR Ulyses Amor, PA-C       Current Outpatient Prescriptions  Medication Sig Dispense Refill  . acetaminophen (TYLENOL) 325 MG tablet Take 650 mg by mouth every 6 (six) hours as needed.      Marland Kitchen aspirin EC 81 MG tablet Take 81 mg by mouth every morning.      . calcium acetate (PHOSLO) 667 MG capsule Take 1,334-2,668 mg by mouth 3 (three) times daily with meals. Takes 4 capsules three times daily before each meal.      . carvedilol (COREG) 12.5 MG tablet Take 12.5 mg by mouth at bedtime.       . diclofenac sodium (VOLTAREN) 1 % GEL Apply 4 g topically 4 (four) times daily.      Marland Kitchen docusate sodium (COLACE) 100 MG capsule Take 100 mg by mouth 2 (two) times daily.      . isosorbide mononitrate (IMDUR) 30 MG 24 hr tablet Take 30 mg by mouth daily.      Marland Kitchen levothyroxine (SYNTHROID, LEVOTHROID) 50 MCG tablet Take 50 mcg by mouth daily before breakfast.       . loperamide (IMODIUM) 2 MG capsule Take 2 mg by mouth as needed for diarrhea or loose stools.      . multivitamin (RENA-VIT) TABS tablet Take 1 tablet by mouth every morning.      Marland Kitchen omeprazole (PRILOSEC) 20 MG capsule  Take 20 mg by mouth 2 (two) times daily before a meal.      . prochlorperazine (COMPAZINE) 5 MG tablet Take 5 mg by mouth every 6 (six) hours as needed for nausea or vomiting.      . topiramate (TOPAMAX) 25 MG tablet Take 25 mg by mouth at bedtime.      . traMADol (ULTRAM) 50 MG tablet Take 50 mg by mouth every 6 (six) hours as needed.      . traZODone (DESYREL) 50 MG tablet Take 25 mg by mouth at bedtime.       . Zinc 50 MG CAPS Take 1 capsule by mouth every morning.         Pt meds include: Statin :No Betablocker: Yes ASA: Yes Other anticoagulants/antiplatelets: None  Past Medical History  Diagnosis Date  . Renal disorder   . Hypertension   . Diabetes mellitus   . Asthma   . Anemia   . Myocardial infarction     Past Surgical History  Procedure Laterality Date  . Appendectomy    . Cholecystectomy    . Tubal ligation    . Hernia repair      X2  . Av fistula  placement Left 12/03/2006  . Av fistula repair Left 11/25/2009  . Insertion of dialysis catheter  2010    Social History History  Substance Use Topics  . Smoking status: Never Smoker   . Smokeless tobacco: Not on file  . Alcohol Use: No    Family History Family history of DM and hypertension   Allergies  Allergen Reactions  . Sulfa Antibiotics Anaphylaxis  . Amoxicillin Hives  . Ibuprofen Hives  . Naldecon Senior [Guaifenesin] Hives     REVIEW OF SYSTEMS  General: [ ]  Weight loss, [ ]  Fever, [ ]  chills Neurologic: [ ]  Dizziness, [ ]  Blackouts, [ ]  Seizure [ ]  Stroke, [ ]  "Mini stroke", [ ]  Slurred speech, [ ]  Temporary blindness; [ ]  weakness in arms or legs, [ ]  Hoarseness [ ]  Dysphagia Cardiac: [ ]  Chest pain/pressure, [ ]  Shortness of breath at rest [ ]  Shortness of breath with exertion, [ ]  Atrial fibrillation or irregular heartbeat  Vascular: [ ]  Pain in legs with walking, [ ]  Pain in legs at rest, [ ]  Pain in legs at night,  [ ]  Non-healing ulcer, [ ]  Blood clot in vein/DVT,   Pulmonary: [ ]   Home oxygen, [ ]  Productive cough, [ ]  Coughing up blood, [ ]  Asthma,  [ ]  Wheezing [ ]  COPD Musculoskeletal:  [ ]  Arthritis, [ ]  Low back pain, [ ]  Joint pain Hematologic: [ ]  Easy Bruising, [x ] Anemia; [ ]  Hepatitis Gastrointestinal: [ ]  Blood in stool, [ ]  Gastroesophageal Reflux/heartburn, Urinary: [x ] chronic Kidney disease, [ ]  on HD - [x ] MWF or [ ]  TTHS, [ ]  Burning with urination, [ ]  Difficulty urinating Skin: [ ]  Rashes, [ ]  Wounds Psychological: [ ]  Anxiety, [ ]  Depression  Physical Examination Filed Vitals:   12/04/13 0856  BP: 128/55  Pulse: 74  Temp: 98.1 F (36.7 C)  TempSrc: Oral  SpO2: 97%   There is no weight on file to calculate BMI.  General:  WDWN in NAD HENT: WNL Eyes: Pupils equal Pulmonary: normal non-labored breathing , without Rales, rhonchi,  wheezing Cardiac: RRR, without  Murmurs, rubs or gallops; Abdomen: soft, NT, no masses Skin: no rashes, ulcers noted;  no Gangrene , no cellulitis; no open wounds;   Vascular Exam/Pulses:Palpable thrill and radial pulses distally left upper extremity. No active bleeding currently on exam.   Musculoskeletal: no muscle wasting or atrophy; no edema  Neurologic: A&O X 3; Appropriate Affect ;  SENSATION: normal; MOTOR FUNCTION: 5/5 Symmetric Speech is fluent/normal   Significant Diagnostic Studies: CBC Lab Results  Component Value Date   WBC 6.8 06/10/2013   HGB 11.4* 06/10/2013   HCT 34.2* 06/10/2013   MCV 105.6* 06/10/2013   PLT 186 06/10/2013    BMET    Component Value Date/Time   NA 136* 06/10/2013 2352   K 4.8 06/10/2013 2352   CL 96 06/10/2013 2352   CO2 27 06/10/2013 2352   GLUCOSE 136* 06/10/2013 2352   BUN 37* 06/10/2013 2352   CREATININE 4.28* 06/10/2013 2352   CALCIUM 10.1 06/10/2013 2352   CALCIUM 7.4* 09/12/2008 0615   GFRNONAA 10* 06/10/2013 2352   GFRAA 12* 06/10/2013 2352   The CrCl is unknown because both a height and weight (above a minimum accepted value) are required for this  calculation.  COAG Lab Results  Component Value Date   INR 1.1 09/07/2008   INR 1.0 12/03/2006     Non-Invasive Vascular Imaging: N/A  ASSESSMENT:  AV fistula bleeding secondary to button hole ulceration.  Plan: revision of AV fistula By Dr. Sherren Mocha Early.  The patient was examined in the ED by Dr. Donnetta Hutching today.  She last ate this am at 8 am.      Laurence Slate [facility] 12/04/2013 10:23 AM  I have examined the patient, reviewed and agree with above. Is now had 4 different episodes of bleeding from the same site. Had a suture placed at the out patient access services today recommended a surgical revisionto prevent further bleeding.  EARLY, TODD, MD 12/04/2013 1:36 PM
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
## 2013-12-04 NOTE — Discharge Instructions (Signed)

## 2013-12-04 NOTE — ED Notes (Signed)
Dr. Early at bedside 

## 2013-12-04 NOTE — Anesthesia Postprocedure Evaluation (Signed)
Anesthesia Post Note  Patient: Roberta Bryant  Procedure(s) Performed: Procedure(s) (LRB): REVISON OF ARTERIOVENOUS FISTULA (Left)  Anesthesia type: MAC  Patient location: PACU  Post pain: Pain level controlled  Post assessment: Patient's Cardiovascular Status Stable  Last Vitals:  Filed Vitals:   12/04/13 1500  BP: 160/84  Pulse: 80  Temp: 36.3 C  Resp: 14    Post vital signs: Reviewed and stable  Level of consciousness: alert  Complications: No apparent anesthesia complications

## 2013-12-04 NOTE — Anesthesia Preprocedure Evaluation (Signed)
Anesthesia Evaluation  Patient identified by MRN, date of birth, ID band Patient awake    Reviewed: Allergy & Precautions, H&P , NPO status , Patient's Chart, lab work & pertinent test results, reviewed documented beta blocker date and time   Airway Mallampati: II TM Distance: >3 FB Neck ROM: full    Dental   Pulmonary asthma ,  breath sounds clear to auscultation        Cardiovascular hypertension, On Home Beta Blockers and On Medications + Past MI Rhythm:regular     Neuro/Psych negative neurological ROS  negative psych ROS   GI/Hepatic negative GI ROS, Neg liver ROS,   Endo/Other  negative endocrine ROSdiabetesMorbid obesity  Renal/GU DialysisRenal disease  negative genitourinary   Musculoskeletal   Abdominal   Peds  Hematology  (+) anemia ,   Anesthesia Other Findings See surgeon's H&P   Reproductive/Obstetrics negative OB ROS                           Anesthesia Physical Anesthesia Plan  ASA: III  Anesthesia Plan: General   Post-op Pain Management:    Induction: Intravenous  Airway Management Planned: LMA  Additional Equipment:   Intra-op Plan:   Post-operative Plan:   Informed Consent: I have reviewed the patients History and Physical, chart, labs and discussed the procedure including the risks, benefits and alternatives for the proposed anesthesia with the patient or authorized representative who has indicated his/her understanding and acceptance.   Dental Advisory Given  Plan Discussed with: CRNA and Surgeon  Anesthesia Plan Comments:         Anesthesia Quick Evaluation

## 2013-12-04 NOTE — Interval H&P Note (Signed)
History and Physical Interval Note:  12/04/2013 1:36 PM  Roberta Bryant  has presented today for surgery, with the diagnosis of Bleeding Left Arm Av Fistula  The various methods of treatment have been discussed with the patient and family. After consideration of risks, benefits and other options for treatment, the patient has consented to  Procedure(s): REVISON OF ARTERIOVENOUS FISTULA (Left) as a surgical intervention .  The patient's history has been reviewed, patient examined, no change in status, stable for surgery.  I have reviewed the patient's chart and labs.  Questions were answered to the patient's satisfaction.     Saanvi Hakala

## 2013-12-05 ENCOUNTER — Encounter (HOSPITAL_COMMUNITY): Payer: Self-pay | Admitting: Vascular Surgery

## 2013-12-05 ENCOUNTER — Telehealth: Payer: Self-pay | Admitting: Vascular Surgery

## 2013-12-05 NOTE — Telephone Encounter (Signed)
Message copied by Gena Fray on Tue Dec 05, 2013  1:21 PM ------      Message from: Peter Minium K      Created: Mon Dec 04, 2013  3:26 PM      Regarding: Schedule                   ----- Message -----         From: Rosetta Posner, MD         Sent: 12/04/2013   3:08 PM           To: [facility] Charge Pool            Exploration of left upper arm AV fistula with repair of defect in the vein and primary closure of the skin. Biochemist, clinical. Does not need to be seen in the office ------

## 2014-01-10 DIAGNOSIS — G20 Parkinson's disease: Secondary | ICD-10-CM | POA: Insufficient documentation

## 2014-01-10 DIAGNOSIS — G250 Essential tremor: Secondary | ICD-10-CM | POA: Insufficient documentation

## 2014-03-21 ENCOUNTER — Other Ambulatory Visit: Payer: Self-pay | Admitting: *Deleted

## 2014-03-21 ENCOUNTER — Encounter: Payer: Self-pay | Admitting: Vascular Surgery

## 2014-03-21 DIAGNOSIS — T82510A Breakdown (mechanical) of surgically created arteriovenous fistula, initial encounter: Secondary | ICD-10-CM

## 2014-04-05 ENCOUNTER — Encounter: Payer: Self-pay | Admitting: Vascular Surgery

## 2014-04-06 ENCOUNTER — Ambulatory Visit (HOSPITAL_COMMUNITY)
Admission: RE | Admit: 2014-04-06 | Discharge: 2014-04-06 | Disposition: A | Discharge status: Home / Self Care | Payer: Medicare Other | Source: Ambulatory Visit | Attending: Vascular Surgery | Admitting: Vascular Surgery

## 2014-04-06 ENCOUNTER — Ambulatory Visit (INDEPENDENT_AMBULATORY_CARE_PROVIDER_SITE_OTHER): Payer: Medicare Other | Admitting: Vascular Surgery

## 2014-04-06 ENCOUNTER — Encounter: Payer: Self-pay | Admitting: Vascular Surgery

## 2014-04-06 VITALS — BP 142/63 | HR 63 | Ht 59.0 in | Wt 211.0 lb

## 2014-04-06 DIAGNOSIS — N186 End stage renal disease: Secondary | ICD-10-CM

## 2014-04-06 DIAGNOSIS — T82510A Breakdown (mechanical) of surgically created arteriovenous fistula, initial encounter: Secondary | ICD-10-CM | POA: Insufficient documentation

## 2014-04-06 DIAGNOSIS — T82898A Other specified complication of vascular prosthetic devices, implants and grafts, initial encounter: Secondary | ICD-10-CM

## 2014-04-06 NOTE — Progress Notes (Signed)
    Established Dialysis Access  History of Present Illness  Roberta Bryant is a 63 y.o. 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  The patient is right hand dominant.  Previous access procedures have been completed in the left arm.  The patient's complication from previous access procedures include: bleeding from button hole.   This bleeding required revision of the fistula by Dr. Donnetta Hutching (12/04/13).  The patient is concerned with growth of her distal fistula.  They do not cannulate this segment of the fistula and she has never had bleeding from this segment.  The patient's PMH, PSH, SH, FamHx, Med, and Allergies are unchanged from 12/04/13.  On ROS today: no steal sx, no recent bleeding complications  Physical Examination  Filed Vitals:   04/06/14 1142  BP: 142/63  Pulse: 63  Height: 4\' 11"  (1.499 m)  Weight: 211 lb (95.709 kg)  SpO2: 98%   Body mass index is 42.59 kg/(m^2).  General: A&O x 3, WD, morbidly obese  Vascular:  Palpable left brachial and radial pulse, aneurysmal L BC AVF, no cannulation scars in distal aneurysmal segment, +strong bruit and thrill  Musculoskeletal: M/S 5/5 BUE throughout , BUE Extremities without  ischemic changes   Neurologic: Pain and light touch intact in extremities , Motor exam as listed above  Non-Invasive Vascular Imaging  Left Arm Access Duplex  (Date: 04/06/2014):   Diameters:  1.1-2.1 cm  Depth 2.8-6.3 mm   Medical Decision Making  Roberta Bryant is a 63 y.o. female who presents with ESRD requiring hemodialysis, aneurysmal L BC AVF   Don't see any findings that would immediately concern me for imminent rupture of this L BC AVF.  If she develops some bleeding complications or pseudoaneurysm degeneration, I would consider plication but as the entire fistula is involved, this is not necessary.  I discussed this with the patient and she is fine with  observation at this point of the fistula.  Roberta Barthel, MD [facility] [place] Office: [phone] Pager: [phone]  04/06/2014, 2:52 PM

## 2014-05-14 ENCOUNTER — Emergency Department (HOSPITAL_COMMUNITY): Payer: Medicare Other

## 2014-05-14 ENCOUNTER — Emergency Department (HOSPITAL_COMMUNITY)
Admission: EM | Admit: 2014-05-14 | Discharge: 2014-05-14 | Disposition: A | Discharge status: Home / Self Care | Payer: Medicare Other | Attending: Emergency Medicine | Admitting: Emergency Medicine

## 2014-05-14 ENCOUNTER — Encounter (HOSPITAL_COMMUNITY): Payer: Self-pay | Admitting: Physical Medicine and Rehabilitation

## 2014-05-14 DIAGNOSIS — Y9389 Activity, other specified: Secondary | ICD-10-CM | POA: Diagnosis not present

## 2014-05-14 DIAGNOSIS — Z79899 Other long term (current) drug therapy: Secondary | ICD-10-CM | POA: Insufficient documentation

## 2014-05-14 DIAGNOSIS — I120 Hypertensive chronic kidney disease with stage 5 chronic kidney disease or end stage renal disease: Secondary | ICD-10-CM | POA: Diagnosis not present

## 2014-05-14 DIAGNOSIS — R0789 Other chest pain: Secondary | ICD-10-CM

## 2014-05-14 DIAGNOSIS — Z880 Allergy status to penicillin: Secondary | ICD-10-CM | POA: Insufficient documentation

## 2014-05-14 DIAGNOSIS — N186 End stage renal disease: Secondary | ICD-10-CM | POA: Insufficient documentation

## 2014-05-14 DIAGNOSIS — E669 Obesity, unspecified: Secondary | ICD-10-CM | POA: Insufficient documentation

## 2014-05-14 DIAGNOSIS — I509 Heart failure, unspecified: Secondary | ICD-10-CM | POA: Insufficient documentation

## 2014-05-14 DIAGNOSIS — I252 Old myocardial infarction: Secondary | ICD-10-CM | POA: Diagnosis not present

## 2014-05-14 DIAGNOSIS — W08XXXA Fall from other furniture, initial encounter: Secondary | ICD-10-CM | POA: Insufficient documentation

## 2014-05-14 DIAGNOSIS — Y92019 Unspecified place in single-family (private) house as the place of occurrence of the external cause: Secondary | ICD-10-CM | POA: Diagnosis not present

## 2014-05-14 DIAGNOSIS — S25809A Unspecified injury of other blood vessels of thorax, unspecified side, initial encounter: Secondary | ICD-10-CM | POA: Diagnosis present

## 2014-05-14 DIAGNOSIS — S2232XA Fracture of one rib, left side, initial encounter for closed fracture: Secondary | ICD-10-CM | POA: Diagnosis not present

## 2014-05-14 DIAGNOSIS — Y998 Other external cause status: Secondary | ICD-10-CM | POA: Insufficient documentation

## 2014-05-14 DIAGNOSIS — E119 Type 2 diabetes mellitus without complications: Secondary | ICD-10-CM | POA: Insufficient documentation

## 2014-05-14 DIAGNOSIS — Z7982 Long term (current) use of aspirin: Secondary | ICD-10-CM | POA: Diagnosis not present

## 2014-05-14 DIAGNOSIS — J45901 Unspecified asthma with (acute) exacerbation: Secondary | ICD-10-CM | POA: Diagnosis not present

## 2014-05-14 LAB — I-STAT CHEM 8, ED
BUN: 50 mg/dL — ABNORMAL HIGH (ref 6–23)
Calcium, Ion: 1.1 mmol/L — ABNORMAL LOW (ref 1.13–1.30)
Chloride: 100 mEq/L (ref 96–112)
Creatinine, Ser: 7.8 mg/dL — ABNORMAL HIGH (ref 0.50–1.10)
Glucose, Bld: 128 mg/dL — ABNORMAL HIGH (ref 70–99)
HCT: 35 % — ABNORMAL LOW (ref 36.0–46.0)
Hemoglobin: 11.9 g/dL — ABNORMAL LOW (ref 12.0–15.0)
Potassium: 4.5 mEq/L (ref 3.7–5.3)
Sodium: 135 mEq/L — ABNORMAL LOW (ref 137–147)
TCO2: 23 mmol/L (ref 0–100)

## 2014-05-14 LAB — CBC
HCT: 33 % — ABNORMAL LOW (ref 36.0–46.0)
Hemoglobin: 11 g/dL — ABNORMAL LOW (ref 12.0–15.0)
MCH: 34 pg (ref 26.0–34.0)
MCHC: 33.3 g/dL (ref 30.0–36.0)
MCV: 101.9 fL — ABNORMAL HIGH (ref 78.0–100.0)
Platelets: 201 10*3/uL (ref 150–400)
RBC: 3.24 MIL/uL — ABNORMAL LOW (ref 3.87–5.11)
RDW: 12.8 % (ref 11.5–15.5)
WBC: 5.5 10*3/uL (ref 4.0–10.5)

## 2014-05-14 MED ORDER — MORPHINE SULFATE 4 MG/ML IJ SOLN
4.0000 mg | Freq: Once | INTRAMUSCULAR | Status: AC
  Administered 2014-05-14: 4 mg via INTRAVENOUS
  Filled 2014-05-14: qty 1

## 2014-05-14 MED ORDER — DIAZEPAM 5 MG/ML IJ SOLN
5.0000 mg | Freq: Once | INTRAMUSCULAR | Status: AC
  Administered 2014-05-14: 5 mg via INTRAVENOUS
  Filled 2014-05-14: qty 2

## 2014-05-14 MED ORDER — OXYCODONE-ACETAMINOPHEN 5-325 MG PO TABS: 1.0000 | ORAL_TABLET | ORAL | Status: DC | PRN

## 2014-05-14 NOTE — Discharge Instructions (Signed)
Be sure to follow up with dialysis as scheduled for tomorrow. You may be more comfortable if you take your pain medication 30 minutes to 1 hour before to going to dialysis.  You should also bring a pillow or blanket to help support your left side when you cough to help with pain.  See below for further instructions.

## 2014-05-14 NOTE — ED Notes (Signed)
Pt given incentive spirometer and instructed to use 10x each hour while awake.  Pt performed return demonstration with rn at bedside

## 2014-05-14 NOTE — ED Notes (Signed)
Pt presents to department for evaluation of fall and L rib pain. States she fell on 11/26 off the cough, now states L sided rib pain, increases with deep breathing. 10/10 pain upon arrival to ED. Respirations unlabored. Pt is alert and oriented x4.

## 2014-05-14 NOTE — ED Notes (Signed)
Junie Panning, PA aware of abnormal lab test results

## 2014-05-14 NOTE — ED Provider Notes (Signed)
CSN: [id number]     Arrival date & time 05/14/14  1037 History   First MD Initiated Contact with Patient 05/14/14 1104     Chief Complaint  Patient presents with  . Fall  . Rib Injury     (Consider location/radiation/quality/duration/timing/severity/associated sxs/prior Treatment) HPI  Pt is a 63yo female with hx of HTN, DM, and CHF on dialysis Sat/Tues/Thurs, presenting to ED with c/o gradually worsening left sided chest wall pain that started on 11/26 after pt fell off a couch at home and hit her side on a loveseat.  Denies hitting her head or LOC at that time. She has been taking acetaminophen w/o relief. Pain is constant, sharp, stabbing, worse with certain movements and deep breathing, "20"/10 on pain score at worst.  Pt states she went to her dialysis on Saturday but states pain was worsened due to the hard chairs. She was only able to complete 3 hours of dialysis due to the pain. Denies fever, chills, n/v/d.   Past Medical History  Diagnosis Date  . Renal disorder   . Hypertension   . Diabetes mellitus   . Asthma   . Anemia   . Myocardial infarction   . CHF (congestive heart failure)    Past Surgical History  Procedure Laterality Date  . Appendectomy    . Cholecystectomy    . Tubal ligation    . Hernia repair      X2  . Av fistula placement Left 12/03/2006  . Av fistula repair Left 11/25/2009  . Insertion of dialysis catheter  2010  . Revison of arteriovenous fistula Left 12/04/2013    Procedure: REVISON OF ARTERIOVENOUS FISTULA;  Surgeon: Rosetta Posner, MD;  Location: [facility];  Service: Vascular;  Laterality: Left;   No family history on file. History  Substance Use Topics  . Smoking status: Never Smoker   . Smokeless tobacco: Not on file  . Alcohol Use: No   OB History    No data available     Review of Systems  Constitutional: Negative for fever and chills.  Respiratory: Positive for shortness of breath ( hurts to take deep breaths). Negative for cough.    Cardiovascular: Positive for chest pain ( left sided rib pain). Negative for palpitations.  Gastrointestinal: Negative for nausea and abdominal pain.  Musculoskeletal: Positive for myalgias. Negative for back pain and neck pain.  Skin: Negative for color change and wound.  All other systems reviewed and are negative.     Allergies  Sulfa antibiotics; Amoxicillin; Ibuprofen; and Naldecon senior  Home Medications   Prior to Admission medications   Medication Sig Start Date End Date Taking? Authorizing Provider  acetaminophen (TYLENOL) 325 MG tablet Take 650 mg by mouth every 6 (six) hours as needed.   Yes Historical Provider, MD  aspirin EC 81 MG tablet Take 81 mg by mouth every morning.   Yes Historical Provider, MD  Biotin 1000 MCG tablet Take 1,000 mcg by mouth 3 (three) times daily.   Yes Historical Provider, MD  calcium acetate (PHOSLO) 667 MG capsule Take 1,334-2,668 mg by mouth 3 (three) times daily with meals. Takes 4 capsules three times daily before each meal.   Yes Historical Provider, MD  carbidopa-levodopa (SINEMET) 25-100 MG per tablet Take 1 tablet by mouth 3 (three) times daily.  02/21/14 02/21/15 Yes Historical Provider, MD  carvedilol (COREG) 12.5 MG tablet Take 25 mg by mouth at bedtime.    Yes Historical Provider, MD  diclofenac sodium (VOLTAREN) 1 %  GEL Apply 4 g topically 4 (four) times daily.   Yes Historical Provider, MD  docusate sodium (COLACE) 100 MG capsule Take 100 mg by mouth 2 (two) times daily.   Yes Historical Provider, MD  fenofibrate (TRICOR) 48 MG tablet Take 48 mg by mouth daily.   Yes Historical Provider, MD  finasteride (PROSCAR) 5 MG tablet Take 2.5 mg by mouth daily.   Yes Historical Provider, MD  isosorbide mononitrate (IMDUR) 30 MG 24 hr tablet Take 30 mg by mouth daily.   Yes Historical Provider, MD  levETIRAcetam (KEPPRA) 250 MG tablet Take 250 mg by mouth 2 (two) times daily.  02/21/14 08/20/14 Yes Historical Provider, MD  levothyroxine  (SYNTHROID, LEVOTHROID) 50 MCG tablet Take 50 mcg by mouth daily before breakfast.    Yes Historical Provider, MD  loperamide (IMODIUM) 2 MG capsule Take 2 mg by mouth as needed for diarrhea or loose stools.   Yes Historical Provider, MD  meclizine (ANTIVERT) 25 MG tablet Take 12.5-25 mg by mouth 3 (three) times daily as needed for dizziness.    Yes Historical Provider, MD  multivitamin (RENA-VIT) TABS tablet Take 1 tablet by mouth every morning.   Yes Historical Provider, MD  omeprazole (PRILOSEC) 20 MG capsule Take 20 mg by mouth 2 (two) times daily before a meal.   Yes Historical Provider, MD  prochlorperazine (COMPAZINE) 5 MG tablet Take 5 mg by mouth every 6 (six) hours as needed for nausea or vomiting.   Yes Historical Provider, MD  traMADol (ULTRAM) 50 MG tablet Take 50 mg by mouth every 6 (six) hours as needed for moderate pain.    Yes Historical Provider, MD  traZODone (DESYREL) 50 MG tablet Take 25 mg by mouth at bedtime.    Yes Historical Provider, MD  Zinc 50 MG CAPS Take 1 capsule by mouth every morning.    Yes Historical Provider, MD  oxyCODONE-acetaminophen (PERCOCET/ROXICET) 5-325 MG per tablet Take 1-2 tablets by mouth every 4 (four) hours as needed for moderate pain. 05/14/14   Noland Fordyce, PA-C   BP 154/67 mmHg  Pulse 61  Temp(Src) 98.1 F (36.7 C) (Oral)  Resp 18  Ht 4\' 10"  (1.473 m)  Wt 220 lb (99.791 kg)  BMI 45.99 kg/m2  SpO2 100% Physical Exam  Constitutional: She appears well-developed and well-nourished. She appears distressed.  Obese female lying in exam bed, leaning toward left side. Unable to get into comfortable position  HENT:  Head: Normocephalic and atraumatic.  Eyes: Conjunctivae are normal. No scleral icterus.  Neck: Normal range of motion.  Cardiovascular: Normal rate, regular rhythm and normal heart sounds.   Pulmonary/Chest: Effort normal and breath sounds normal. No respiratory distress. She has no wheezes. She has no rales. She exhibits tenderness.   No respiratory distress. Tenderness to left sided posterior and lateral aspect of chest wall along ribs five through nine.  No crepitus or flail chest. Decreased breath sounds in lower lung fields due to pain with inspiration.   Abdominal: Soft. Bowel sounds are normal. She exhibits no distension and no mass. There is no tenderness. There is no rebound and no guarding.  Musculoskeletal: Normal range of motion.  Neurological: She is alert.  Skin: Skin is warm and dry. She is not diaphoretic. No erythema.  Left chest wall: no erythema, ecchymosis or rash  Nursing note and vitals reviewed.   ED Course  Procedures (including critical care time) Labs Review Labs Reviewed  CBC - Abnormal; Notable for the following:  RBC 3.24 (*)    Hemoglobin 11.0 (*)    HCT 33.0 (*)    MCV 101.9 (*)    All other components within normal limits  I-STAT CHEM 8, ED - Abnormal; Notable for the following:    Sodium 135 (*)    BUN 50 (*)    Creatinine, Ser 7.80 (*)    Glucose, Bld 128 (*)    Calcium, Ion 1.10 (*)    Hemoglobin 11.9 (*)    HCT 35.0 (*)    All other components within normal limits    Imaging Review Dg Ribs Unilateral W/chest Left  05/14/2014   CLINICAL DATA:  Pt states that she fell off her love seat in her living room. Pain along the lower posterior aspect of left ribs.  EXAM: LEFT RIBS AND CHEST - 3+ VIEW  COMPARISON:  06/11/2013  FINDINGS: Normal mediastinum and cardiac silhouette. Normal pulmonary vasculature. No evidence of effusion, infiltrate, or pneumothorax. Dedicated view of the left ribs demonstrates a minimally displaced fracture of the left posterior seventh rib.  IMPRESSION: Fracture of the posterior aspect of left seventh rib. No pneumothorax.   Electronically Signed   By: Suzy Bouchard M.D.   On: 05/14/2014 13:38     EKG Interpretation None      MDM   Final diagnoses:  Left-sided chest wall pain  Left rib fracture, closed, initial encounter    Pt is a 63yo  obese female c/o left sided chest wall pain after falling off a sofa at home, hitting another piece of furniture 05/03/14.  Pt is tenderness to posterior lateral aspect of left ribs five through nine. No crepitus or flail chest. No respiratory distress. Lungs: CTAB. O2 100% on RA. Plain films: significant for fracture of posterior aspect of left seventh rib. No pneumothorax.  Pt was given morphine and valium in ED which improved her pain from "20"/10 to 10/10. Pt requesting something to eat and drink.  Labs: Cr is elevated but c/w hx of CKD and due for dialysis tomorrow. K+ is WNL.  Will discharge pt home with pain medication and insentive spirometry.  Home care instructions provided. Strongly encouraged pt to take her pain medication prior to going to dialysis so she is more comfortable.  Advised to f/u with PCP in 1 week for recheck of symptoms if not improving.  Return precautions provided. Pt verbalized understanding and agreement with tx plan.   Discussed pt with Dr. Alvino Chapel who agrees with assessment and plan.    Noland Fordyce, PA-C 05/14/14 [place] Alvino Chapel, MD 05/15/14 1257

## 2014-06-03 ENCOUNTER — Encounter (HOSPITAL_COMMUNITY): Payer: Self-pay | Admitting: *Deleted

## 2014-06-03 ENCOUNTER — Emergency Department (HOSPITAL_COMMUNITY): Payer: Medicare Other

## 2014-06-03 ENCOUNTER — Emergency Department (HOSPITAL_COMMUNITY)
Admission: EM | Admit: 2014-06-03 | Discharge: 2014-06-03 | Disposition: A | Discharge status: Home / Self Care | Payer: Medicare Other | Attending: Emergency Medicine | Admitting: Emergency Medicine

## 2014-06-03 DIAGNOSIS — I509 Heart failure, unspecified: Secondary | ICD-10-CM | POA: Insufficient documentation

## 2014-06-03 DIAGNOSIS — Z880 Allergy status to penicillin: Secondary | ICD-10-CM | POA: Diagnosis not present

## 2014-06-03 DIAGNOSIS — D649 Anemia, unspecified: Secondary | ICD-10-CM | POA: Insufficient documentation

## 2014-06-03 DIAGNOSIS — N186 End stage renal disease: Secondary | ICD-10-CM | POA: Insufficient documentation

## 2014-06-03 DIAGNOSIS — Z791 Long term (current) use of non-steroidal anti-inflammatories (NSAID): Secondary | ICD-10-CM | POA: Insufficient documentation

## 2014-06-03 DIAGNOSIS — Z7982 Long term (current) use of aspirin: Secondary | ICD-10-CM | POA: Insufficient documentation

## 2014-06-03 DIAGNOSIS — M542 Cervicalgia: Secondary | ICD-10-CM | POA: Diagnosis not present

## 2014-06-03 DIAGNOSIS — I252 Old myocardial infarction: Secondary | ICD-10-CM | POA: Diagnosis not present

## 2014-06-03 DIAGNOSIS — I120 Hypertensive chronic kidney disease with stage 5 chronic kidney disease or end stage renal disease: Secondary | ICD-10-CM | POA: Diagnosis not present

## 2014-06-03 DIAGNOSIS — J45909 Unspecified asthma, uncomplicated: Secondary | ICD-10-CM | POA: Diagnosis not present

## 2014-06-03 DIAGNOSIS — G20 Parkinson's disease: Secondary | ICD-10-CM | POA: Insufficient documentation

## 2014-06-03 DIAGNOSIS — Z992 Dependence on renal dialysis: Secondary | ICD-10-CM | POA: Diagnosis not present

## 2014-06-03 DIAGNOSIS — E119 Type 2 diabetes mellitus without complications: Secondary | ICD-10-CM | POA: Insufficient documentation

## 2014-06-03 DIAGNOSIS — Z79899 Other long term (current) drug therapy: Secondary | ICD-10-CM | POA: Diagnosis not present

## 2014-06-03 MED ORDER — CYCLOBENZAPRINE HCL 5 MG PO TABS: 5.0000 mg | ORAL_TABLET | Freq: Every day | ORAL | Status: DC

## 2014-06-03 MED ORDER — OXYCODONE-ACETAMINOPHEN 5-325 MG PO TABS
2.0000 | ORAL_TABLET | Freq: Once | ORAL | Status: AC
  Administered 2014-06-03: 2 via ORAL
  Filled 2014-06-03: qty 2

## 2014-06-03 MED ORDER — OXYCODONE-ACETAMINOPHEN 5-325 MG PO TABS: 1.0000 | ORAL_TABLET | Freq: Four times a day (QID) | ORAL | Status: DC | PRN

## 2014-06-03 NOTE — ED Notes (Addendum)
Pt to ED c/o R sided neck pain and shoulder pain x 3 weeks. Reports taking half a oxycondone this morning

## 2014-06-03 NOTE — Discharge Instructions (Signed)
Your neck pain is likely due to a muscle spasm. Call for a follow up appointment with a Family or Primary Care Provider.  Call an orthopedist for further evaluation of your neck pain and discomfort. Return if Symptoms worsen.   Take medication as prescribed.  Ice and heat intermittently to right side of neck. Do gentle stretching and range of motion 3 times a day.

## 2014-06-03 NOTE — ED Provider Notes (Signed)
CSN: [id number]     Arrival date & time 06/03/14  0707 History   First MD Initiated Contact with Patient 06/03/14 [phone]     Chief Complaint  Patient presents with  . Neck Pain     (Consider location/radiation/quality/duration/timing/severity/associated sxs/prior Treatment) HPI Comments: Pt is a 63yo female with hx of HTN, DM, and CHF on dialysis Sat/Tues/Thurs presenting to emergency room chief complaint of right neck discomfort for 3 weeks. She denies injury or fall. She reports right sided neck pain worsened with movement. She reports taking Percocet, Tylenol without relief. She also reports trying massage. Patient's reports her nephrologist age she thinks she has a stroke due to abnormal gait ongoing since diagnosed with Parkinson's, worsening over the last several weeks, currently taking meclizine for dizziness. She reports lightheadedness upon standing, with near-syncope, ongoing for several weeks. Patient reports missing dialysis appointment on Thursday, last dialyzed yesterday, full run of 3.5 hours on machine.  Patient is a 63 y.o. female presenting with neck pain. The history is provided by the patient. No language interpreter was used.  Neck Pain Associated symptoms: no headaches and no numbness     Past Medical History  Diagnosis Date  . Renal disorder   . Hypertension   . Diabetes mellitus   . Asthma   . Anemia   . Myocardial infarction   . CHF (congestive heart failure)    Past Surgical History  Procedure Laterality Date  . Appendectomy    . Cholecystectomy    . Tubal ligation    . Hernia repair      X2  . Av fistula placement Left 12/03/2006  . Av fistula repair Left 11/25/2009  . Insertion of dialysis catheter  2010  . Revison of arteriovenous fistula Left 12/04/2013    Procedure: REVISON OF ARTERIOVENOUS FISTULA;  Surgeon: Rosetta Posner, MD;  Location: [facility];  Service: Vascular;  Laterality: Left;   No family history on file. History  Substance Use Topics  .  Smoking status: Never Smoker   . Smokeless tobacco: Not on file  . Alcohol Use: No   OB History    No data available     Review of Systems  Musculoskeletal: Positive for neck pain.  Neurological: Negative for numbness and headaches.      Allergies  Sulfa antibiotics; Amoxicillin; Ibuprofen; and Naldecon senior  Home Medications   Prior to Admission medications   Medication Sig Start Date End Date Taking? Authorizing Provider  acetaminophen (TYLENOL) 325 MG tablet Take 650 mg by mouth every 6 (six) hours as needed.    Historical Provider, MD  aspirin EC 81 MG tablet Take 81 mg by mouth every morning.    Historical Provider, MD  Biotin 1000 MCG tablet Take 1,000 mcg by mouth 3 (three) times daily.    Historical Provider, MD  calcium acetate (PHOSLO) 667 MG capsule Take 1,334-2,668 mg by mouth 3 (three) times daily with meals. Takes 4 capsules three times daily before each meal.    Historical Provider, MD  carbidopa-levodopa (SINEMET) 25-100 MG per tablet Take 1 tablet by mouth 3 (three) times daily.  02/21/14 02/21/15  Historical Provider, MD  carvedilol (COREG) 12.5 MG tablet Take 25 mg by mouth at bedtime.     Historical Provider, MD  diclofenac sodium (VOLTAREN) 1 % GEL Apply 4 g topically 4 (four) times daily.    Historical Provider, MD  docusate sodium (COLACE) 100 MG capsule Take 100 mg by mouth 2 (two) times daily.  Historical Provider, MD  fenofibrate (TRICOR) 48 MG tablet Take 48 mg by mouth daily.    Historical Provider, MD  finasteride (PROSCAR) 5 MG tablet Take 2.5 mg by mouth daily.    Historical Provider, MD  isosorbide mononitrate (IMDUR) 30 MG 24 hr tablet Take 30 mg by mouth daily.    Historical Provider, MD  levETIRAcetam (KEPPRA) 250 MG tablet Take 250 mg by mouth 2 (two) times daily.  02/21/14 08/20/14  Historical Provider, MD  levothyroxine (SYNTHROID, LEVOTHROID) 50 MCG tablet Take 50 mcg by mouth daily before breakfast.     Historical Provider, MD  loperamide  (IMODIUM) 2 MG capsule Take 2 mg by mouth as needed for diarrhea or loose stools.    Historical Provider, MD  meclizine (ANTIVERT) 25 MG tablet Take 12.5-25 mg by mouth 3 (three) times daily as needed for dizziness.     Historical Provider, MD  multivitamin (RENA-VIT) TABS tablet Take 1 tablet by mouth every morning.    Historical Provider, MD  omeprazole (PRILOSEC) 20 MG capsule Take 20 mg by mouth 2 (two) times daily before a meal.    Historical Provider, MD  oxyCODONE-acetaminophen (PERCOCET/ROXICET) 5-325 MG per tablet Take 1-2 tablets by mouth every 4 (four) hours as needed for moderate pain. 05/14/14   Noland Fordyce, PA-C  prochlorperazine (COMPAZINE) 5 MG tablet Take 5 mg by mouth every 6 (six) hours as needed for nausea or vomiting.    Historical Provider, MD  traMADol (ULTRAM) 50 MG tablet Take 50 mg by mouth every 6 (six) hours as needed for moderate pain.     Historical Provider, MD  traZODone (DESYREL) 50 MG tablet Take 25 mg by mouth at bedtime.     Historical Provider, MD  Zinc 50 MG CAPS Take 1 capsule by mouth every morning.     Historical Provider, MD   BP 130/90 mmHg  Pulse 68  Temp(Src) 98.3 F (36.8 C) (Oral)  Resp 18  SpO2 98% Physical Exam  Constitutional: She is oriented to person, place, and time. She appears well-developed and well-nourished.  HENT:  Head: Normocephalic and atraumatic.  Neck: Neck supple. No spinous process tenderness present.    Decrease active range of motion secondary to effort and pain.  Pulmonary/Chest: Effort normal. No respiratory distress.  Musculoskeletal: Normal range of motion.  Neurological: She is alert and oriented to person, place, and time. GCS eye subscore is 4. GCS verbal subscore is 5. GCS motor subscore is 6.  Skin: Skin is dry.  Psychiatric: She has a normal mood and affect. Her behavior is normal.  Nursing note and vitals reviewed.   ED Course  Procedures (including critical care time) Labs Review Labs Reviewed - No  data to display  Imaging Review Dg Cervical Spine Complete  06/03/2014   CLINICAL DATA:  Three weeks of worsening RIGHT side neck pain, popping sensation, pain traveling down back to T12 level  EXAM: CERVICAL SPINE  4+ VIEWS  COMPARISON:  None  FINDINGS: Osseous demineralization.  Prevertebral soft tissues normal thickness.  Vertebral body and disc space heights maintained.  No acute fracture, subluxation or bone destruction.  Multilevel facet degenerative changes.  Encroachment mild cervical neural foramina by uncovertebral spurs.  C1-C2 alignment normal.  Atherosclerotic calcifications at aortic arch and within BILATERAL carotid systems.  IMPRESSION: Multilevel facet degenerative changes cervical spine.  No acute osseous abnormalities.  Scattered atherosclerotic disease changes.   Electronically Signed   By: Lavonia Dana M.D.   On: 06/03/2014 09:42  EKG Interpretation None      MDM   Final diagnoses:  Cervical muscle pain   Patient with right-sided neck discomfort for 3 weeks worsen with movement. Denies upper extremity numbness or weakness.. Reports worse with movement, reproducible on palpation. Likely spasm x-ray ordered. Patient history of lightheadedness and abnormal gait likely secondary to history of Parkinson's, currently being treated and worked up as an outpatient with meclizine. Discussed remaining seated until lightheadedness subsides. Encouraged follow-up with PCP. X-ray shows degenerative changes. 1020 reevaluation patient resting in room. Reports mild to moderate relief of symptoms after pain medication. Discussed x-ray results with the patient and patient's sons. Plan to follow up with orthopedist, ice, heat, pain medication, muscle relaxer at night. Meds given in ED:  Medications  oxyCODONE-acetaminophen (PERCOCET/ROXICET) 5-325 MG per tablet 2 tablet (2 tablets Oral Given 06/03/14 0800)    New Prescriptions   CYCLOBENZAPRINE (FLEXERIL) 5 MG TABLET    Take 1 tablet (5  mg total) by mouth at bedtime.   OXYCODONE-ACETAMINOPHEN (PERCOCET/ROXICET) 5-325 MG PER TABLET    Take 1 tablet by mouth every 6 (six) hours as needed for moderate pain or severe pain.     Harvie Heck, PA-C 06/03/14 [place], MD 06/05/14 [phone]

## 2014-06-03 NOTE — ED Notes (Signed)
Pt c/o R sided neck pain x 3 weeks. States nephrology believes she had a stroke. Pt noted to have some drift to R leg; no other neuro impairments. Pt is dialysis Tuesday, Thursday, Saturday; reports missing Thursday's dialysis due to feeling weak. Pt unable to rotate neck from left to right or touch chin to chest.

## 2014-10-30 ENCOUNTER — Encounter (HOSPITAL_COMMUNITY): Payer: Self-pay | Admitting: Physical Medicine and Rehabilitation

## 2014-10-30 ENCOUNTER — Emergency Department (HOSPITAL_COMMUNITY): Payer: Medicare Other

## 2014-10-30 ENCOUNTER — Observation Stay (HOSPITAL_COMMUNITY)
Admission: EM | Admit: 2014-10-30 | Discharge: 2014-10-31 | Disposition: A | Discharge status: Home / Self Care | Payer: Medicare Other | Attending: Family Medicine | Admitting: Family Medicine

## 2014-10-30 ENCOUNTER — Other Ambulatory Visit (HOSPITAL_COMMUNITY): Payer: Self-pay

## 2014-10-30 DIAGNOSIS — N186 End stage renal disease: Secondary | ICD-10-CM | POA: Diagnosis not present

## 2014-10-30 DIAGNOSIS — Z880 Allergy status to penicillin: Secondary | ICD-10-CM | POA: Diagnosis not present

## 2014-10-30 DIAGNOSIS — G8929 Other chronic pain: Secondary | ICD-10-CM | POA: Diagnosis not present

## 2014-10-30 DIAGNOSIS — Z992 Dependence on renal dialysis: Secondary | ICD-10-CM | POA: Insufficient documentation

## 2014-10-30 DIAGNOSIS — I959 Hypotension, unspecified: Secondary | ICD-10-CM | POA: Insufficient documentation

## 2014-10-30 DIAGNOSIS — D649 Anemia, unspecified: Secondary | ICD-10-CM | POA: Insufficient documentation

## 2014-10-30 DIAGNOSIS — K219 Gastro-esophageal reflux disease without esophagitis: Secondary | ICD-10-CM | POA: Diagnosis not present

## 2014-10-30 DIAGNOSIS — I509 Heart failure, unspecified: Secondary | ICD-10-CM | POA: Insufficient documentation

## 2014-10-30 DIAGNOSIS — R109 Unspecified abdominal pain: Principal | ICD-10-CM | POA: Insufficient documentation

## 2014-10-30 DIAGNOSIS — K589 Irritable bowel syndrome without diarrhea: Secondary | ICD-10-CM | POA: Insufficient documentation

## 2014-10-30 DIAGNOSIS — M1990 Unspecified osteoarthritis, unspecified site: Secondary | ICD-10-CM | POA: Diagnosis not present

## 2014-10-30 DIAGNOSIS — R9431 Abnormal electrocardiogram [ECG] [EKG]: Secondary | ICD-10-CM | POA: Diagnosis not present

## 2014-10-30 DIAGNOSIS — Z791 Long term (current) use of non-steroidal anti-inflammatories (NSAID): Secondary | ICD-10-CM | POA: Insufficient documentation

## 2014-10-30 DIAGNOSIS — E785 Hyperlipidemia, unspecified: Secondary | ICD-10-CM | POA: Insufficient documentation

## 2014-10-30 DIAGNOSIS — J45909 Unspecified asthma, uncomplicated: Secondary | ICD-10-CM | POA: Diagnosis not present

## 2014-10-30 DIAGNOSIS — E119 Type 2 diabetes mellitus without complications: Secondary | ICD-10-CM | POA: Diagnosis not present

## 2014-10-30 DIAGNOSIS — Z7982 Long term (current) use of aspirin: Secondary | ICD-10-CM | POA: Insufficient documentation

## 2014-10-30 DIAGNOSIS — M549 Dorsalgia, unspecified: Secondary | ICD-10-CM | POA: Diagnosis not present

## 2014-10-30 DIAGNOSIS — Z79899 Other long term (current) drug therapy: Secondary | ICD-10-CM | POA: Insufficient documentation

## 2014-10-30 DIAGNOSIS — I252 Old myocardial infarction: Secondary | ICD-10-CM | POA: Diagnosis not present

## 2014-10-30 DIAGNOSIS — I120 Hypertensive chronic kidney disease with stage 5 chronic kidney disease or end stage renal disease: Secondary | ICD-10-CM | POA: Insufficient documentation

## 2014-10-30 DIAGNOSIS — E039 Hypothyroidism, unspecified: Secondary | ICD-10-CM | POA: Diagnosis not present

## 2014-10-30 DIAGNOSIS — R079 Chest pain, unspecified: Secondary | ICD-10-CM | POA: Insufficient documentation

## 2014-10-30 HISTORY — DX: End stage renal disease: N18.6

## 2014-10-30 HISTORY — DX: Gastro-esophageal reflux disease without esophagitis: K21.9

## 2014-10-30 HISTORY — DX: Unspecified asthma, uncomplicated: J45.909

## 2014-10-30 HISTORY — DX: Dorsalgia, unspecified: M54.9

## 2014-10-30 HISTORY — DX: Other chronic pain: G89.29

## 2014-10-30 HISTORY — DX: Dependence on renal dialysis: Z99.2

## 2014-10-30 HISTORY — DX: Unspecified osteoarthritis, unspecified site: M19.90

## 2014-10-30 HISTORY — DX: Personal history of other medical treatment: Z92.89

## 2014-10-30 HISTORY — DX: Hypothyroidism, unspecified: E03.9

## 2014-10-30 HISTORY — DX: Irritable bowel syndrome, unspecified: K58.9

## 2014-10-30 HISTORY — DX: Headache, unspecified: R51.9

## 2014-10-30 HISTORY — DX: Hyperlipidemia, unspecified: E78.5

## 2014-10-30 HISTORY — DX: Type 2 diabetes mellitus without complications: E11.9

## 2014-10-30 HISTORY — DX: Headache: R51

## 2014-10-30 LAB — LIPID PANEL
Cholesterol: 306 mg/dL — ABNORMAL HIGH (ref 0–200)
HDL: 48 mg/dL (ref >40)
LDL Cholesterol: UNDETERMINED mg/dL (ref 0–99)
TRIGLYCERIDES: 695 mg/dL — AB (ref <150)
Total CHOL/HDL Ratio: 6.4 RATIO
VLDL: UNDETERMINED mg/dL (ref 0–40)

## 2014-10-30 LAB — POCT I-STAT TROPONIN I: TROPONIN I, POC: 0.01 ng/mL (ref 0.00–0.08)

## 2014-10-30 LAB — COMPREHENSIVE METABOLIC PANEL
ALT: 14 U/L (ref 14–54)
ANION GAP: 13 (ref 5–15)
AST: 22 U/L (ref 15–41)
Albumin: 4 g/dL (ref 3.5–5.0)
Alkaline Phosphatase: 70 U/L (ref 38–126)
BUN: 22 mg/dL — ABNORMAL HIGH (ref 6–20)
CO2: 30 mmol/L (ref 22–32)
Calcium: 9 mg/dL (ref 8.9–10.3)
Chloride: 92 mmol/L — ABNORMAL LOW (ref 101–111)
Creatinine, Ser: 5.09 mg/dL — ABNORMAL HIGH (ref 0.44–1.00)
GFR, EST AFRICAN AMERICAN: 9 mL/min — AB (ref >60)
GFR, EST NON AFRICAN AMERICAN: 8 mL/min — AB (ref >60)
GLUCOSE: 135 mg/dL — AB (ref 65–99)
POTASSIUM: 4.5 mmol/L (ref 3.5–5.1)
Sodium: 135 mmol/L (ref 135–145)
Total Bilirubin: 0.7 mg/dL (ref 0.3–1.2)
Total Protein: 8.2 g/dL — ABNORMAL HIGH (ref 6.5–8.1)

## 2014-10-30 LAB — CBC WITH DIFFERENTIAL/PLATELET
BASOS PCT: 0 % (ref 0–1)
Basophils Absolute: 0 10*3/uL (ref 0.0–0.1)
EOS ABS: 0.7 10*3/uL (ref 0.0–0.7)
EOS PCT: 6 % — AB (ref 0–5)
HCT: 38.5 % (ref 36.0–46.0)
HEMOGLOBIN: 13.2 g/dL (ref 12.0–15.0)
LYMPHS PCT: 12 % (ref 12–46)
Lymphs Abs: 1.3 10*3/uL (ref 0.7–4.0)
MCH: 35.4 pg — ABNORMAL HIGH (ref 26.0–34.0)
MCHC: 34.3 g/dL (ref 30.0–36.0)
MCV: 103.2 fL — ABNORMAL HIGH (ref 78.0–100.0)
Monocytes Absolute: 0.7 10*3/uL (ref 0.1–1.0)
Monocytes Relative: 6 % (ref 3–12)
NEUTROS ABS: 8 10*3/uL — AB (ref 1.7–7.7)
Neutrophils Relative %: 76 % (ref 43–77)
Platelets: 207 10*3/uL (ref 150–400)
RBC: 3.73 MIL/uL — AB (ref 3.87–5.11)
RDW: 14.1 % (ref 11.5–15.5)
WBC: 10.7 10*3/uL — ABNORMAL HIGH (ref 4.0–10.5)

## 2014-10-30 LAB — GLUCOSE, CAPILLARY: Glucose-Capillary: 137 mg/dL — ABNORMAL HIGH (ref 65–99)

## 2014-10-30 LAB — URINALYSIS, ROUTINE W REFLEX MICROSCOPIC
Bilirubin Urine: NEGATIVE
Glucose, UA: NEGATIVE mg/dL
Ketones, ur: NEGATIVE mg/dL
Nitrite: NEGATIVE
PH: 6 (ref 5.0–8.0)
PROTEIN: 100 mg/dL — AB
Specific Gravity, Urine: 1.012 (ref 1.005–1.030)
Urobilinogen, UA: 0.2 mg/dL (ref 0.0–1.0)

## 2014-10-30 LAB — TSH: TSH: 3.769 u[IU]/mL (ref 0.350–4.500)

## 2014-10-30 LAB — I-STAT TROPONIN, ED: Troponin i, poc: 0.02 ng/mL (ref 0.00–0.08)

## 2014-10-30 LAB — URINE MICROSCOPIC-ADD ON

## 2014-10-30 LAB — TROPONIN I

## 2014-10-30 MED ORDER — HEPARIN SODIUM (PORCINE) 5000 UNIT/ML IJ SOLN
5000.0000 [IU] | Freq: Three times a day (TID) | INTRAMUSCULAR | Status: DC
  Administered 2014-10-30 – 2014-10-31 (×2): 5000 [IU] via SUBCUTANEOUS
  Filled 2014-10-30 (×2): qty 1

## 2014-10-30 MED ORDER — ACETAMINOPHEN 325 MG PO TABS: 650.0000 mg | ORAL_TABLET | ORAL | Status: DC | PRN

## 2014-10-30 MED ORDER — GI COCKTAIL ~~LOC~~: 30.0000 mL | Freq: Four times a day (QID) | ORAL | Status: DC | PRN

## 2014-10-30 MED ORDER — GI COCKTAIL ~~LOC~~: 30.0000 mL | Freq: Once | ORAL | Status: DC

## 2014-10-30 MED ORDER — TRAMADOL HCL 50 MG PO TABS
50.0000 mg | ORAL_TABLET | Freq: Four times a day (QID) | ORAL | Status: DC | PRN
  Administered 2014-10-31 (×2): 50 mg via ORAL
  Filled 2014-10-30 (×3): qty 1

## 2014-10-30 MED ORDER — ASPIRIN EC 325 MG PO TBEC
325.0000 mg | DELAYED_RELEASE_TABLET | Freq: Every day | ORAL | Status: DC
  Administered 2014-10-31: 325 mg via ORAL
  Filled 2014-10-30: qty 1

## 2014-10-30 MED ORDER — CARVEDILOL 12.5 MG PO TABS
12.5000 mg | ORAL_TABLET | Freq: Every day | ORAL | Status: DC
  Filled 2014-10-30: qty 1

## 2014-10-30 MED ORDER — ONDANSETRON HCL 4 MG/2ML IJ SOLN: 4.0000 mg | Freq: Once | INTRAMUSCULAR | Status: DC

## 2014-10-30 MED ORDER — LEVOTHYROXINE SODIUM 50 MCG PO TABS
50.0000 ug | ORAL_TABLET | Freq: Every day | ORAL | Status: DC
  Administered 2014-10-31: 50 ug via ORAL
  Filled 2014-10-30: qty 1

## 2014-10-30 MED ORDER — ENSURE ENLIVE PO LIQD: 237.0000 mL | Freq: Two times a day (BID) | ORAL | Status: DC

## 2014-10-30 MED ORDER — DOCUSATE SODIUM 100 MG PO CAPS
100.0000 mg | ORAL_CAPSULE | Freq: Two times a day (BID) | ORAL | Status: DC
  Administered 2014-10-30 – 2014-10-31 (×2): 100 mg via ORAL
  Filled 2014-10-30 (×2): qty 1

## 2014-10-30 MED ORDER — TRAZODONE HCL 50 MG PO TABS: 25.0000 mg | ORAL_TABLET | Freq: Every evening | ORAL | Status: DC | PRN

## 2014-10-30 MED ORDER — ACETAMINOPHEN 500 MG PO TABS
1000.0000 mg | ORAL_TABLET | Freq: Once | ORAL | Status: AC
  Administered 2014-10-30: 1000 mg via ORAL
  Filled 2014-10-30: qty 2

## 2014-10-30 MED ORDER — CALCIUM ACETATE 667 MG PO CAPS: 1334.0000 mg | ORAL_CAPSULE | Freq: Three times a day (TID) | ORAL | Status: DC

## 2014-10-30 MED ORDER — CARBIDOPA-LEVODOPA 25-100 MG PO TABS
1.0000 | ORAL_TABLET | Freq: Three times a day (TID) | ORAL | Status: DC
  Administered 2014-10-30 – 2014-10-31 (×3): 1 via ORAL
  Filled 2014-10-30 (×3): qty 1

## 2014-10-30 MED ORDER — RENA-VITE PO TABS
1.0000 | ORAL_TABLET | Freq: Every morning | ORAL | Status: DC
  Administered 2014-10-31: 1 via ORAL
  Filled 2014-10-30: qty 1

## 2014-10-30 MED ORDER — CALCIUM ACETATE (PHOS BINDER) 667 MG PO CAPS
2668.0000 mg | ORAL_CAPSULE | Freq: Three times a day (TID) | ORAL | Status: DC
  Administered 2014-10-31 (×3): 2668 mg via ORAL
  Filled 2014-10-30 (×4): qty 4

## 2014-10-30 MED ORDER — PANTOPRAZOLE SODIUM 40 MG PO TBEC
40.0000 mg | DELAYED_RELEASE_TABLET | Freq: Two times a day (BID) | ORAL | Status: DC
  Administered 2014-10-30 – 2014-10-31 (×2): 40 mg via ORAL
  Filled 2014-10-30 (×2): qty 1

## 2014-10-30 MED ORDER — CARVEDILOL 25 MG PO TABS: 25.0000 mg | ORAL_TABLET | Freq: Every day | ORAL | Status: DC

## 2014-10-30 MED ORDER — ONDANSETRON HCL 4 MG/2ML IJ SOLN: 4.0000 mg | Freq: Four times a day (QID) | INTRAMUSCULAR | Status: DC | PRN

## 2014-10-30 MED ORDER — TRAZODONE HCL 50 MG PO TABS
25.0000 mg | ORAL_TABLET | Freq: Every day | ORAL | Status: DC
  Administered 2014-10-30: 25 mg via ORAL
  Filled 2014-10-30: qty 1

## 2014-10-30 MED ORDER — LEVETIRACETAM 250 MG PO TABS
250.0000 mg | ORAL_TABLET | Freq: Two times a day (BID) | ORAL | Status: DC
  Administered 2014-10-31: 250 mg via ORAL
  Filled 2014-10-30 (×3): qty 1

## 2014-10-30 MED ORDER — FAMOTIDINE IN NACL 20-0.9 MG/50ML-% IV SOLN: 20.0000 mg | Freq: Once | INTRAVENOUS | Status: DC

## 2014-10-30 MED ORDER — ASPIRIN 81 MG PO CHEW
324.0000 mg | CHEWABLE_TABLET | Freq: Once | ORAL | Status: AC
  Administered 2014-10-30: 324 mg via ORAL
  Filled 2014-10-30: qty 4

## 2014-10-30 NOTE — ED Provider Notes (Signed)
CSN: [id number]     Arrival date & time 10/30/14  1304 History   First MD Initiated Contact with Patient 10/30/14 1722     Chief Complaint  Patient presents with  . Back Pain  . Abdominal Pain  . Hypotension     (Consider location/radiation/quality/duration/timing/severity/associated sxs/prior Treatment) HPI Comments: The patient is a 64 year old female, she has a history of end-stage renal disease and is on hemodialysis, she has a left arm fistula, she dialyzes on Tuesday Thursday and Saturday.  She reports several concerns  #1 abdominal pain - she states that she has had a lower abdominal discomfort which is a cramping feeling that has been present on and off for approximately 3 weeks, she also reports that she has had a large amount of stools, they have been normal appearing, sometimes bulky, no blood, no diarrhea. She denies nausea or vomiting, her abdominal pain is intermittent. She has approximately 3 or 4 episodes of urination today despite being on dialysis, no dysuria or hematuria  #2 back pain - she reports that she has a spasm type sensation between her shoulder blades in her upper back which is there most days, intermittent, usually last 30 seconds to a minute, it has been particularly severe today.  The patient denies any active chest pain, shortness of breath, weakness or numbness, she does report having hypotension that occurred during dialysis with a registered blood pressures of approximately [id number] systolic, this improved with 2 small fluid boluses of 200 mL normal saline each and she was normotensive on arrival. There has been no fevers chills nausea vomiting swelling of the legs rashes chest pain weakness numbness or difficulty ambulating. There is no changes in vision.    Patient is a 64 y.o. female presenting with back pain and abdominal pain. The history is provided by the patient and a relative.  Back Pain Associated symptoms: abdominal pain   Abdominal Pain   Past  Medical History  Diagnosis Date  . Renal disorder   . Hypertension   . Diabetes mellitus   . Asthma   . Anemia   . Myocardial infarction   . CHF (congestive heart failure)   . IBS (irritable bowel syndrome)    Past Surgical History  Procedure Laterality Date  . Appendectomy    . Cholecystectomy    . Tubal ligation    . Hernia repair      X2  . Av fistula placement Left 12/03/2006  . Av fistula repair Left 11/25/2009  . Insertion of dialysis catheter  2010  . Revison of arteriovenous fistula Left 12/04/2013    Procedure: REVISON OF ARTERIOVENOUS FISTULA;  Surgeon: Rosetta Posner, MD;  Location: [facility];  Service: Vascular;  Laterality: Left;   No family history on file. History  Substance Use Topics  . Smoking status: Never Smoker   . Smokeless tobacco: Not on file  . Alcohol Use: No   OB History    No data available     Review of Systems  Gastrointestinal: Positive for abdominal pain.  Musculoskeletal: Positive for back pain.  All other systems reviewed and are negative.     Allergies  Sulfa antibiotics; Amoxicillin; Ibuprofen; and Naldecon senior  Home Medications   Prior to Admission medications   Medication Sig Start Date End Date Taking? Authorizing Provider  acetaminophen (TYLENOL) 500 MG tablet Take 500 mg by mouth every 6 (six) hours as needed for mild pain.   Yes Historical Provider, MD  aspirin EC 81 MG tablet  Take 81 mg by mouth every morning.   Yes Historical Provider, MD  BIOTIN PO Take 1 tablet by mouth daily. 10,011mcg   Yes Historical Provider, MD  calcium acetate (PHOSLO) 667 MG capsule Take 1,334-2,668 mg by mouth 3 (three) times daily with meals. Takes 4 capsules three times daily before each meal.   Yes Historical Provider, MD  carbidopa-levodopa (SINEMET) 25-100 MG per tablet Take 1 tablet by mouth 3 (three) times daily.  02/21/14 02/21/15 Yes Historical Provider, MD  carvedilol (COREG) 12.5 MG tablet Take 25 mg by mouth at bedtime.    Yes  Historical Provider, MD  cyclobenzaprine (FLEXERIL) 5 MG tablet Take 1 tablet (5 mg total) by mouth at bedtime. Patient not taking: Reported on 10/30/2014 06/03/14   Harvie Heck, PA-C  diclofenac sodium (VOLTAREN) 1 % GEL Apply 4 g topically 4 (four) times daily.   Yes Historical Provider, MD  dicyclomine (BENTYL) 10 MG capsule Take 10 mg by mouth daily as needed for spasms.   Yes Historical Provider, MD  docusate sodium (COLACE) 100 MG capsule Take 100 mg by mouth 2 (two) times daily.   Yes Historical Provider, MD  fenofibrate (TRICOR) 48 MG tablet Take 48 mg by mouth daily.   Yes Historical Provider, MD  finasteride (PROSCAR) 5 MG tablet Take 2.5 mg by mouth daily.   Yes Historical Provider, MD  levETIRAcetam (KEPPRA) 250 MG tablet Take 250 mg by mouth 2 (two) times daily.  02/21/14 08/20/14  Historical Provider, MD  levothyroxine (SYNTHROID, LEVOTHROID) 50 MCG tablet Take 50 mcg by mouth daily before breakfast.    Yes Historical Provider, MD  loperamide (IMODIUM) 2 MG capsule Take 2 mg by mouth as needed for diarrhea or loose stools.   Yes Historical Provider, MD  meclizine (ANTIVERT) 25 MG tablet Take 12.5-25 mg by mouth 3 (three) times daily as needed for dizziness.    Yes Historical Provider, MD  multivitamin (RENA-VIT) TABS tablet Take 1 tablet by mouth every morning.   Yes Historical Provider, MD  Omega-3 Fatty Acids (FISH OIL) 1000 MG CAPS Take 2 capsules by mouth daily.   Yes Historical Provider, MD  omeprazole (PRILOSEC) 20 MG capsule Take 20 mg by mouth 2 (two) times daily before a meal.   Yes Historical Provider, MD  oxyCODONE-acetaminophen (PERCOCET/ROXICET) 5-325 MG per tablet Take 1-2 tablets by mouth every 4 (four) hours as needed for moderate pain. Patient not taking: Reported on 10/30/2014 05/14/14   Noland Fordyce, PA-C  oxyCODONE-acetaminophen (PERCOCET/ROXICET) 5-325 MG per tablet Take 1 tablet by mouth every 6 (six) hours as needed for moderate pain or severe pain. Patient not  taking: Reported on 10/30/2014 06/03/14   Harvie Heck, PA-C  prochlorperazine (COMPAZINE) 5 MG tablet Take 5 mg by mouth every 6 (six) hours as needed for nausea or vomiting.   Yes Historical Provider, MD  traMADol (ULTRAM) 50 MG tablet Take 50 mg by mouth every 6 (six) hours as needed for moderate pain.    Yes Historical Provider, MD  traZODone (DESYREL) 50 MG tablet Take 25 mg by mouth at bedtime.    Yes Historical Provider, MD  Zinc 50 MG CAPS Take 1 capsule by mouth every morning.    Yes Historical Provider, MD   BP 111/65 mmHg  Pulse 75  Temp(Src) 98.1 F (36.7 C) (Oral)  Resp 25  SpO2 99% Physical Exam  Constitutional: She appears well-developed and well-nourished. No distress.  HENT:  Head: Normocephalic and atraumatic.  Mouth/Throat: Oropharynx is clear and  moist. No oropharyngeal exudate.  Eyes: Conjunctivae and EOM are normal. Pupils are equal, round, and reactive to light. Right eye exhibits no discharge. Left eye exhibits no discharge. No scleral icterus.  Neck: Normal range of motion. Neck supple. No JVD present. No thyromegaly present.  Cardiovascular: Normal rate, regular rhythm, normal heart sounds and intact distal pulses.  Exam reveals no gallop and no friction rub.   No murmur heard. Fistula in the left upper extremity with good thrill, no overlying redness or induration  Pulmonary/Chest: Effort normal and breath sounds normal. No respiratory distress. She has no wheezes. She has no rales. She exhibits tenderness (tenderness to palpation over the left pectoralis muscle).  Abdominal: Soft. Bowel sounds are normal. She exhibits no distension and no mass. There is no tenderness.  Musculoskeletal: Normal range of motion. She exhibits tenderness. She exhibits no edema.  Tenderness to palpation over the upper back  Lymphadenopathy:    She has no cervical adenopathy.  Neurological: She is alert. Coordination normal.  Skin: Skin is warm and dry. No rash noted. No erythema.   Psychiatric: She has a normal mood and affect. Her behavior is normal.  Nursing note and vitals reviewed.   ED Course  Procedures (including critical care time) Labs Review Labs Reviewed  CBC WITH DIFFERENTIAL/PLATELET - Abnormal; Notable for the following:    WBC 10.7 (*)    RBC 3.73 (*)    MCV 103.2 (*)    MCH 35.4 (*)    Neutro Abs 8.0 (*)    Eosinophils Relative 6 (*)    All other components within normal limits  COMPREHENSIVE METABOLIC PANEL - Abnormal; Notable for the following:    Chloride 92 (*)    Glucose, Bld 135 (*)    BUN 22 (*)    Creatinine, Ser 5.09 (*)    Total Protein 8.2 (*)    GFR calc non Af Amer 8 (*)    GFR calc Af Amer 9 (*)    All other components within normal limits  URINALYSIS, ROUTINE W REFLEX MICROSCOPIC  I-STAT TROPOININ, ED  I-STAT TROPOININ, ED    Imaging Review Dg Chest 2 View  10/30/2014   CLINICAL DATA:  Chest and upper back pain, shortness of Breath  EXAM: CHEST  2 VIEW  COMPARISON:  06/11/2013  FINDINGS: Cardiac shadow is within normal limits. The lungs are clear bilaterally. Old rib fractures again noted of the left seventh rib posterior laterally. No acute abnormality is noted.  IMPRESSION: No active cardiopulmonary disease.   Electronically Signed   By: Inez Catalina M.D.   On: 10/30/2014 13:56     EKG Interpretation   Date/Time:  Tuesday Oct 30 2014 13:37:57 EDT Ventricular Rate:  80 PR Interval:  160 QRS Duration: 90 QT Interval:  424 QTC Calculation: 489 R Axis:   22 Text Interpretation:  Normal sinus rhythm T wave abnormality, consider  lateral ischemia Abnormal ECG Since last tracing T wave abnormality NOW  PRESENT but was present on 1/15 ECG Reconfirmed by Terrius Gentile  MD, Zauria Dombek  [phone]) on 10/30/2014 6:38:08 PM      MDM   Final diagnoses:  Abdominal pain  Abnormal ECG  Hypotension, unspecified hypotension type    The patient has an abnormal EKG compared to prior with T wave abnormality. She has labs and chest x-ray  showing no leukocytosis, no anemia, normal troponin and normal metabolic panel except for creatinine which is 5, her chest x-ray shows no signs of pulmonary edema or infiltrates.  Blood pressure is now normal, we'll repeat EKG as there is an abnormal finding, recheck troponin  Pt has abnormal ECG compared with prior - the VS are normal - no hypotension here, with hypotension during dialysis and new ECG findings and upper back pain, will admit.  D/w Dr. Rosana Berger, willadmilt.  Noemi Chapel, MD 10/30/14 [phone]

## 2014-10-30 NOTE — H&P (Signed)
[facility] Admission History and Physical Service Pager: [phone]  Patient name: Roberta Bryant Medical record number: [id number] Date of birth: September 02, 1950 Age: 64 y.o. Gender: female  Primary Care Provider: Angelina Sheriff., MD Consultants: cardiology Code Status: FULL (discussed on admission)  Chief Complaint: back pain and abnormal EKG  Assessment and Plan: [name] is a 64 y.o. female presenting with chronic back pain and abnormal EKG . PMH is significant for HTN, ESRD on dialysis, ?CHF, ?T2DM, ?MI   T wave abnormality on EKG: in Leads I and aVL.  BP stable.  Given EKG changes, questionable h/o MI (pt states that she was unaware that she had an MI in past) and other co morbidities> Heart score 4, warranting observation and cardiac rule out.  CXR neg. iStatTrop neg. WBC 10.7. After digging through chart further.  EKG from 11/22/2009 cardio note/admission with same EKG findings. -Place in observation under Dr McDiarmid -Telemetry -VS per floor protocol -Cycle troponins -Risk stratification labs -EKG in am -repeat BMET, CBC in am -ASA, BB -call Patient's cardiologist is Dr Agustin Cree in [place], consider c/s to cards pending talking with him.  -consider starting statin, patient on fenofibrate only  ESRD on dialysis: Had HD today.  T,Th, Sat with Dr Justin Mend. -continue Phoslo. Rena-Vit -will need to c/s Renal if stays past tomorrow for HD  HTN: BP controlled in ED -continue home Coreg 12.5  CHF?: Echo 2011 with Grade 1 diastolic dysfunction. EF 60-65%. No wall motion abnormalities  T2DM: not on any medications but patient states she has DM. A1c 6.9 in 2011. -A1c pending  Chronic Back pain: tramadol and flexeril at home -continue home tramadol PRN back pain  Parkinson's dz: per patient -continue home Sinemet  Sleep disturbance/difficulty: -Continue home trazodone  FEN/GI: HH/carb mod diet, PPI Prophylaxis: Sub-q  heparin  Disposition: Observation.  Discharge pending cardiac rule out   History of Present Illness: Roberta Bryant is a 64 y.o. female presenting with back pain and found to have abnormalities on EKG Patient reports that back pain has been ongoing for several years.  She is frequently seen in the ED for this.  Pain is in her upper back.  She describes it as spasms that are intermittent.  She takes tramadol and flexeril for pain.  She notes that she has not taken this recently and does not often have to use it.  She further mentions that she was hypotensive to systolic BPs of [id number] at hemodialysis today.  She reports that she was dosed with 280mL of IVFs and that it came up to 90's then to 120's.    When asked about her heart, she reports that she sees a cardiologist but does not know what for.  I specifically inquired about the MI that is denoted in the EMR and she is unaware as to whether she has had a heart attack before.  Though she sees a cardiologist but she does not know why.  Last visit was 06/2014.  She endorses some sweating.  She denies dizziness, SOB, CP, vomiting.  Review Of Systems: Per HPI with the following additions: none Otherwise 12 point review of systems was performed and was unremarkable.  Patient Active Problem List   Diagnosis Date Noted  . 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 End stage renal disease 07/18/2012  . Other complications due to renal dialysis device, implant, and graft 07/18/2012   Past Medical History: Past Medical History  Diagnosis Date  .  Renal disorder   . Hypertension   . Diabetes mellitus   . Asthma   . Anemia   . Myocardial infarction   . CHF (congestive heart failure)   . IBS (irritable bowel syndrome)    Past Surgical History: Past Surgical History  Procedure Laterality Date  . Appendectomy    . Cholecystectomy    . Tubal ligation    . Hernia repair      X2  . Av fistula placement Left 12/03/2006  . Av fistula  repair Left 11/25/2009  . Insertion of dialysis catheter  2010  . Revison of arteriovenous fistula Left 12/04/2013    Procedure: REVISON OF ARTERIOVENOUS FISTULA;  Surgeon: Rosetta Posner, MD;  Location: [facility] OR;  Service: Vascular;  Laterality: Left;   Social History: History  Substance Use Topics  . Smoking status: Never Smoker   . Smokeless tobacco: Not on file  . Alcohol Use: No   Additional social history: no tobacco, ETOH, drug use Please also refer to relevant sections of EMR.  Family History: No family history on file. Allergies and Medications: Allergies  Allergen Reactions  . Sulfa Antibiotics Anaphylaxis  . Amoxicillin Hives  . Ibuprofen Hives  . Naldecon Senior [Guaifenesin] Hives   No current facility-administered medications on file prior to encounter.   Current Outpatient Prescriptions on File Prior to Encounter  Medication Sig Dispense Refill  . aspirin EC 81 MG tablet Take 81 mg by mouth every morning.    . calcium acetate (PHOSLO) 667 MG capsule Take 1,334-2,668 mg by mouth 3 (three) times daily with meals. Takes 4 capsules three times daily before each meal.    . carbidopa-levodopa (SINEMET) 25-100 MG per tablet Take 1 tablet by mouth 3 (three) times daily.     . carvedilol (COREG) 12.5 MG tablet Take 25 mg by mouth at bedtime.     . cyclobenzaprine (FLEXERIL) 5 MG tablet Take 1 tablet (5 mg total) by mouth at bedtime. (Patient not taking: Reported on 10/30/2014) 4 tablet 0  . diclofenac sodium (VOLTAREN) 1 % GEL Apply 4 g topically 4 (four) times daily.    Marland Kitchen docusate sodium (COLACE) 100 MG capsule Take 100 mg by mouth 2 (two) times daily.    . fenofibrate (TRICOR) 48 MG tablet Take 48 mg by mouth daily.    . finasteride (PROSCAR) 5 MG tablet Take 2.5 mg by mouth daily.    Marland Kitchen levETIRAcetam (KEPPRA) 250 MG tablet Take 250 mg by mouth 2 (two) times daily.     Marland Kitchen levothyroxine (SYNTHROID, LEVOTHROID) 50 MCG tablet Take 50 mcg by mouth daily before breakfast.     .  loperamide (IMODIUM) 2 MG capsule Take 2 mg by mouth as needed for diarrhea or loose stools.    . meclizine (ANTIVERT) 25 MG tablet Take 12.5-25 mg by mouth 3 (three) times daily as needed for dizziness.     . multivitamin (RENA-VIT) TABS tablet Take 1 tablet by mouth every morning.    Marland Kitchen omeprazole (PRILOSEC) 20 MG capsule Take 20 mg by mouth 2 (two) times daily before a meal.    . oxyCODONE-acetaminophen (PERCOCET/ROXICET) 5-325 MG per tablet Take 1-2 tablets by mouth every 4 (four) hours as needed for moderate pain. (Patient not taking: Reported on 10/30/2014) 20 tablet 0  . oxyCODONE-acetaminophen (PERCOCET/ROXICET) 5-325 MG per tablet Take 1 tablet by mouth every 6 (six) hours as needed for moderate pain or severe pain. (Patient not taking: Reported on 10/30/2014) 15 tablet 0  .  prochlorperazine (COMPAZINE) 5 MG tablet Take 5 mg by mouth every 6 (six) hours as needed for nausea or vomiting.    . traMADol (ULTRAM) 50 MG tablet Take 50 mg by mouth every 6 (six) hours as needed for moderate pain.     . traZODone (DESYREL) 50 MG tablet Take 25 mg by mouth at bedtime.     . Zinc 50 MG CAPS Take 1 capsule by mouth every morning.       Objective: BP 111/65 mmHg  Pulse 75  Temp(Src) 98.1 F (36.7 C) (Oral)  Resp 25  SpO2 99% Exam: General: awake, alert, resting in bed, NAD, son at bedside Eyes: EOMI, no conjunctival injection ENTM: o/p clear, MMM Neck: no LAD Cardiovascular: RRR, no murmurs, no thrills Respiratory: CTAB, no increased WOB, no wheeze Abdomen: obese, soft, NT/ND, +BS MSK: no edema, +1DP, LUE with fistula, no evidence of infection, +mild TTP to thoracic paraspinal muscles but no bony abnormalities, some flattening of T curve appreciated Skin: dry, intact Neuro: no focal deficits, follows commands, AOx3 Psych: normal speech, mood stable  Labs and Imaging: CBC BMET   Recent Labs Lab 10/30/14 1343  WBC 10.7*  HGB 13.2  HCT 38.5  PLT 207    Recent Labs Lab  10/30/14 1343  NA 135  K 4.5  CL 92*  CO2 30  BUN 22*  CREATININE 5.09*  GLUCOSE 135*  CALCIUM 9.0     Dg Chest 2 View  10/30/2014   CLINICAL DATA:  Chest and upper back pain, shortness of Breath  EXAM: CHEST  2 VIEW  COMPARISON:  06/11/2013  FINDINGS: Cardiac shadow is within normal limits. The lungs are clear bilaterally. Old rib fractures again noted of the left seventh rib posterior laterally. No acute abnormality is noted.  IMPRESSION: No active cardiopulmonary disease.   Electronically Signed   By: Inez Catalina M.D.   On: 10/30/2014 13:56    Janora Norlander, DO 10/30/2014, 6:36 PM PGY-1, [place] [facility] pager: [phone], text pages welcome  FPTS Upper-Level Resident Addendum  I have independently interviewed and examined the patient. I have discussed the above with the original author and agree with their documentation. My edits for correction/addition/clarification are in pink. Please see also any attending notes.   Frazier Richards, MD PGY-2, [place] Service pager: [phone] (text pages welcome through [facility])

## 2014-10-30 NOTE — ED Notes (Signed)
Pt presents to department for evaluation of hypotension during hemodialysis. Also states back and abdominal pain. States L arm fistula. Pt is alert and oriented x4.

## 2014-10-31 DIAGNOSIS — I953 Hypotension of hemodialysis: Secondary | ICD-10-CM | POA: Diagnosis not present

## 2014-10-31 DIAGNOSIS — R109 Unspecified abdominal pain: Secondary | ICD-10-CM | POA: Diagnosis not present

## 2014-10-31 DIAGNOSIS — R9431 Abnormal electrocardiogram [ECG] [EKG]: Secondary | ICD-10-CM

## 2014-10-31 DIAGNOSIS — R079 Chest pain, unspecified: Secondary | ICD-10-CM

## 2014-10-31 DIAGNOSIS — M549 Dorsalgia, unspecified: Secondary | ICD-10-CM | POA: Diagnosis not present

## 2014-10-31 DIAGNOSIS — I959 Hypotension, unspecified: Secondary | ICD-10-CM | POA: Diagnosis not present

## 2014-10-31 LAB — BASIC METABOLIC PANEL
Anion gap: 14 (ref 5–15)
BUN: 40 mg/dL — AB (ref 6–20)
CALCIUM: 8.5 mg/dL — AB (ref 8.9–10.3)
CHLORIDE: 92 mmol/L — AB (ref 101–111)
CO2: 29 mmol/L (ref 22–32)
Creatinine, Ser: 7.25 mg/dL — ABNORMAL HIGH (ref 0.44–1.00)
GFR calc Af Amer: 6 mL/min — ABNORMAL LOW (ref >60)
GFR calc non Af Amer: 5 mL/min — ABNORMAL LOW (ref >60)
Glucose, Bld: 138 mg/dL — ABNORMAL HIGH (ref 65–99)
Potassium: 4.6 mmol/L (ref 3.5–5.1)
Sodium: 135 mmol/L (ref 135–145)

## 2014-10-31 LAB — CBC
HCT: 37.7 % (ref 36.0–46.0)
Hemoglobin: 12.4 g/dL (ref 12.0–15.0)
MCH: 34.4 pg — ABNORMAL HIGH (ref 26.0–34.0)
MCHC: 32.9 g/dL (ref 30.0–36.0)
MCV: 104.7 fL — ABNORMAL HIGH (ref 78.0–100.0)
PLATELETS: 203 10*3/uL (ref 150–400)
RBC: 3.6 MIL/uL — ABNORMAL LOW (ref 3.87–5.11)
RDW: 14.2 % (ref 11.5–15.5)
WBC: 6.5 10*3/uL (ref 4.0–10.5)

## 2014-10-31 LAB — HEMOGLOBIN A1C
Hgb A1c MFr Bld: 6.3 % — ABNORMAL HIGH (ref 4.8–5.6)
Mean Plasma Glucose: 134 mg/dL

## 2014-10-31 LAB — GLUCOSE, CAPILLARY
GLUCOSE-CAPILLARY: 76 mg/dL (ref 65–99)
Glucose-Capillary: 102 mg/dL — ABNORMAL HIGH (ref 65–99)

## 2014-10-31 LAB — TROPONIN I
Troponin I: <0.03 ng/mL (ref <0.031)
Troponin I: <0.03 ng/mL (ref <0.031)

## 2014-10-31 MED ORDER — TRAMADOL HCL 50 MG PO TABS
50.0000 mg | ORAL_TABLET | ORAL | Status: AC
  Administered 2014-10-31: 50 mg via ORAL

## 2014-10-31 NOTE — Progress Notes (Signed)
Nutrition Brief Note  Patient identified on the Malnutrition Screening Tool (MST) Report. Patient reports minimal 5 lb weight loss in one year. No significant weight loss noted for time frame.   Wt Readings from Last 15 Encounters:  10/31/14 207 lb 1.6 oz (93.94 kg)  05/14/14 220 lb (99.791 kg)  04/06/14 211 lb (95.709 kg)  09/23/13 220 lb (99.791 kg)  06/10/13 220 lb (99.791 kg)  07/18/12 223 lb 8 oz (101.379 kg)    Body mass index is 43.3 kg/(m^2). Patient meets criteria for class 3, extreme/morbid obesity based on current BMI.   Current diet order is heart healthy, patient is consuming approximately 50-75% of meals at this time. Labs and medications reviewed.   No nutrition interventions warranted at this time. If nutrition issues arise, please consult RD.   Molli Barrows, RD, LDN, [place] Pager [phone] After Hours Pager [phone]

## 2014-10-31 NOTE — Discharge Summary (Signed)
[facility] Discharge Summary  Patient name: Roberta Bryant Medical record number: [id number] Date of birth: 04-20-1951 Age: 64 y.o. Gender: female Date of Admission: 10/30/2014  Date of Discharge: 10/31/2014 Admitting Physician: Blane Ohara McDiarmid, MD  Primary Care Provider: Angelina Sheriff., MD Consultants: Cardiology  Indication for Hospitalization: EKG changes, hypotension  Discharge Diagnoses/Problem List:  ESRD on dialysis, HTN, CHF, T2DM, history of MI  Disposition: Home  Discharge Condition: Stable  Discharge Exam:  Blood pressure 130/62, pulse 63, temperature 98.5 F (36.9 C), temperature source Oral, resp. rate 18, height 4\' 10"  (1.473 m), weight 207 lb 1.6 oz (93.94 kg), SpO2 98 %. General: NAD, lying in hospital bed Cardiovascular: RRR, no murmurs Respiratory: NWOB, CTAB Abdomen: Obese, +BS, S, NT, ND Extremities: No edema, WWP Neuro: Alert and oriented. No focal deficits.   Brief Hospital Course:  Roberta Bryant is a 64 year old female with history significant for ESRD on dialysis and prior MI who presented to the ED from her dialysis center with hypotension (SBPs reported to be in the 40s) and T wave changes in leads I and aVL on her EKG. She was admitted for ACS rule out. Patient did not have any chest pain or any other symptoms of ACS during her stay. Upon chart review, we found that the patient had the same EKG changes intermittently since at least 2011. The rest of her work up here included negative troponins x3 and a repeat EKG which was stable. We obtained risk stratification labs which were significant for hypertriglyceridemia, and an elevated A1C. Cardiology saw the patient and determined that no further work up was needed. Patient's vitals were normal during her stay and she was discharged home in stable condition.   The patient's other chronic medical conditions were stable during this admission.  Issues for Follow Up:   1. Consider starting statin for elevated triglycerides, however there is increased risk of statin-induced myopathy given her fenofibrate therapy. Additionally, there has been no mortality benefit shown for statin therapy in patients with ESRD on dialysis.  2. Consider increasing estimated dry weight at dialysis to help prevent hypotension.  3. Consider starting antiglycemic agent or reinforcing lifestyle modifications given elevated A1c of 6.3%  Significant Procedures: None  Significant Labs and Imaging:   Recent Labs Lab 10/30/14 1343 10/31/14 0835  WBC 10.7* 6.5  HGB 13.2 12.4  HCT 38.5 37.7  PLT 207 203    Recent Labs Lab 10/30/14 1343 10/31/14 0835  NA 135 135  K 4.5 4.6  CL 92* 92*  CO2 30 29  GLUCOSE 135* 138*  BUN 22* 40*  CREATININE 5.09* 7.25*  CALCIUM 9.0 8.5*  ALKPHOS 70  --   AST 22  --   ALT 14  --   ALBUMIN 4.0  --    Lipid panel: Total cholesterol 306, TG 695, HDL 48, LDL incalculable  A1c 6.3 TSH 3.769 EKG: NSR, stable T wave inversions in I and aVL  Dg Chest 2 View  10/30/2014   CLINICAL DATA:  Chest and upper back pain, shortness of Breath  EXAM: CHEST  2 VIEW  COMPARISON:  06/11/2013  FINDINGS: Cardiac shadow is within normal limits. The lungs are clear bilaterally. Old rib fractures again noted of the left seventh rib posterior laterally. No acute abnormality is noted.  IMPRESSION: No active cardiopulmonary disease.   Electronically Signed   By: Inez Catalina M.D.   On: 10/30/2014 13:56   Results/Tests Pending at Time  of Discharge: None  Discharge Medications:    Medication List    STOP taking these medications        levETIRAcetam 250 MG tablet  Commonly known as:  KEPPRA      TAKE these medications        acetaminophen 500 MG tablet  Commonly known as:  TYLENOL  Take 500 mg by mouth every 6 (six) hours as needed for mild pain.     aspirin EC 81 MG tablet  Take 81 mg by mouth every morning.     BIOTIN PO  Take 1 tablet by mouth  daily. 10,067mcg     calcium acetate 667 MG capsule  Commonly known as:  PHOSLO  Take 1,334-2,668 mg by mouth 3 (three) times daily with meals. Takes 4 capsules three times daily before each meal.     carvedilol 12.5 MG tablet  Commonly known as:  COREG  Take 25 mg by mouth at bedtime.     cyclobenzaprine 5 MG tablet  Commonly known as:  FLEXERIL  Take 1 tablet (5 mg total) by mouth at bedtime.     diclofenac sodium 1 % Gel  Commonly known as:  VOLTAREN  Apply 4 g topically 4 (four) times daily.     dicyclomine 10 MG capsule  Commonly known as:  BENTYL  Take 10 mg by mouth daily as needed for spasms.     docusate sodium 100 MG capsule  Commonly known as:  COLACE  Take 100 mg by mouth 2 (two) times daily.     fenofibrate 48 MG tablet  Commonly known as:  TRICOR  Take 48 mg by mouth daily.     finasteride 5 MG tablet  Commonly known as:  PROSCAR  Take 2.5 mg by mouth daily.     Fish Oil 1000 MG Caps  Take 2 capsules by mouth daily.     levothyroxine 50 MCG tablet  Commonly known as:  SYNTHROID, LEVOTHROID  Take 50 mcg by mouth daily before breakfast.     loperamide 2 MG capsule  Commonly known as:  IMODIUM  Take 2 mg by mouth as needed for diarrhea or loose stools.     meclizine 25 MG tablet  Commonly known as:  ANTIVERT  Take 12.5-25 mg by mouth 3 (three) times daily as needed for dizziness.     multivitamin Tabs tablet  Take 1 tablet by mouth every morning.     omeprazole 20 MG capsule  Commonly known as:  PRILOSEC  Take 20 mg by mouth 2 (two) times daily before a meal.     oxyCODONE-acetaminophen 5-325 MG per tablet  Commonly known as:  PERCOCET/ROXICET  Take 1-2 tablets by mouth every 4 (four) hours as needed for moderate pain.     oxyCODONE-acetaminophen 5-325 MG per tablet  Commonly known as:  PERCOCET/ROXICET  Take 1 tablet by mouth every 6 (six) hours as needed for moderate pain or severe pain.     prochlorperazine 5 MG tablet  Commonly known as:   COMPAZINE  Take 5 mg by mouth every 6 (six) hours as needed for nausea or vomiting.     SINEMET 25-100 MG per tablet  Generic drug:  carbidopa-levodopa  Take 1 tablet by mouth 3 (three) times daily.     traMADol 50 MG tablet  Commonly known as:  ULTRAM  Take 50 mg by mouth every 6 (six) hours as needed for moderate pain.     traZODone 50 MG tablet  Commonly  known as:  DESYREL  Take 25 mg by mouth at bedtime.     Zinc 50 MG Caps  Take 1 capsule by mouth every morning.        Discharge Instructions: Please refer to Patient Instructions section of EMR for full details.  Patient was counseled important signs and symptoms that should prompt return to medical care, changes in medications, dietary instructions, activity restrictions, and follow up appointments.   Follow-Up Appointments:     Follow-up Information    Schedule an appointment as soon as possible for a visit with [place] [facility] Angelique Blonder., MD.   Specialty:  Family Medicine   Contact information:   [place] [address] [phone]       Schedule an appointment as soon as possible for a visit with [name], MD.   Specialty:  Cardiology   Contact information:   [place] [address] [phone]       Schedule an appointment as soon as possible for a visit with Sherril Croon, MD.   Specialty:  Nephrology   Contact information:   [place] [address] [phone]       Vivi Barrack, MD 10/31/2014, 3:02 PM PGY-1, [place]

## 2014-10-31 NOTE — Discharge Instructions (Signed)
You were admitted to the hospital with EKG changes after having low blood pressure at dialysis. We ran blood tests here which were negative. You also saw our cardiologists who did not feel like any further investigation was needed. It is very important that you follow up with your primary doctor, and your cardiologist.

## 2014-10-31 NOTE — Progress Notes (Signed)
Patient admitted with atypical CP. Seen by Cards ok for d/c. CXR negative. CP may have been induced by large goal on HD. Will raise EDW 1 kg and establish max UF goal per treatment.  Discussed with Resident, Dr. Joelyn Oms and dialysis unit.  Amalia Hailey, PA-C

## 2014-10-31 NOTE — Progress Notes (Signed)
Family Medicine Teaching Service Daily Progress Note Intern Pager: [phone]  Patient name: Roberta Bryant Medical record number: [id number] Date of birth: 26-Apr-1951 Age: 64 y.o. Gender: female  Primary Care Provider: Angelina Sheriff., MD Consultants: Cardiology Code Status: Full  Pt Overview and Major Events to Date:  5/24 - Admitted with back pain and abnormal EKG  Assessment and Plan: Roberta Bryant is a 64 y.o. female presenting with chronic back pain and abnormal EKG . PMH is significant for HTN, ESRD on dialysis, ?CHF, ?T2DM, ?MI  ACS rule out: No chest pain. T wave changes in Leads I and aVL.Has had same EKG changes intermittently. EKG from 11/22/2009 cardio note/admission with same EKG findings. Heart score 4.  -Troponin negative x2 -Lipid panel: Total cholesterol 306, TG 695, HDL 48 -A1c 6.3 -ASA, BB -Per patient's primary cardiologist, patient has questionable history of MI in 2004, stress test in 2010 showed no ischemia, echo in September with EF of 50-55%.  -consider starting statin, patient on fenofibrate only  Hyperlipidemia. Total cholesterol 306, TG 695, HDL 48. - Continue fenofibrate - Consider starting statin as outpatient, though limited data showing benefit in ESRD patients, and additionally increases risk of myopathy with concurrent fenofibrate therapy.  ESRD on dialysis:  T,Th, Sat with Dr Justin Mend. -continue Phoslo. Rena-Vit -will need to c/s Renal if stays past tomorrow for HD  HTN/HFpEF: BP controlled in ED. Echo 2011 with Grade 1 diastolic dysfunction. EF 60-65%. No wall motion abnormalities -continue home Coreg 12.5  T2DM: not on any medications but patient states she has DM. A1c 6.9 in 2011. -A1c 6.3  Chronic Back pain: tramadol and flexeril at home -continue home tramadol PRN back pain  Parkinson's dz: per patient -continue home Sinemet  Sleep disturbance/difficulty: -Continue home trazodone  Disposition: Admitted pending above  management.   Subjective:  Doing well this morning. No chest pain or shortness of breath.   Objective: Temp:  [98.1 F (36.7 C)-99 F (37.2 C)] 98.3 F (36.8 C) (05/25 0400) Pulse Rate:  [58-79] 58 (05/25 0400) Resp:  [11-25] 20 (05/25 0400) BP: (101-131)/(39-71) 117/60 mmHg (05/25 0400) SpO2:  [96 %-100 %] 100 % (05/25 0400) Weight:  [207 lb 1.6 oz (93.94 kg)-207 lb 6.4 oz (94.076 kg)] 207 lb 1.6 oz (93.94 kg) (05/25 0400) Physical Exam: General: NAD, lying in hospital bed Cardiovascular: RRR, no murmurs Respiratory: NWOB, CTAB Abdomen: Obese, +BS, S, NT, ND Extremities: No edema, WWP Neuro: Alert and oriented. No focal deficits.   Laboratory/Imaging/Diagnostic Testing:  Recent Labs Lab 10/30/14 1343  WBC 10.7*  HGB 13.2  HCT 38.5  PLT 207    Recent Labs Lab 10/30/14 1343  NA 135  K 4.5  CL 92*  CO2 30  BUN 22*  CREATININE 5.09*  CALCIUM 9.0  PROT 8.2*  BILITOT 0.7  ALKPHOS 70  ALT 14  AST 22  GLUCOSE 135*     Recent Labs Lab 10/30/14 2057 10/31/14 0122  TROPONINI <0.03 <0.03   Lipid panel: Total cholesterol 306, TG 695, HDL 48, LDL incalculable  A1c 6.3 TSH 3.769 EKG: NSR, stable T wave inversions in I and aVL  Roberta Barrack, MD 10/31/2014, 7:24 AM PGY-1, [facility] Intern pager: [phone], text pages welcome

## 2014-10-31 NOTE — Consult Note (Signed)
CARDIOLOGY CONSULT NOTE   Patient ID: Roberta Bryant MRN: [id number] DOB/AGE: 06/13/1950 64 y.o.  Admit Date: 10/30/2014  Primary Physician: Angelina Sheriff., MD  Primary Cardiologist   In [facility] Summary Roberta Bryant is a 64 y.o.female.  She has had back pain.. Cardiology is seeing her as part of the program to see all patients on the chest pain unit. Patient is stable. EKGs revealed nonspecific ST-T wave changes. Troponin is normal. I have done a limited evaluation.   Allergies  Allergen Reactions  . Sulfa Antibiotics Anaphylaxis  . Amoxicillin Hives  . Ibuprofen Hives  . Naldecon Senior [Guaifenesin] Hives    Medications Scheduled Medications: . aspirin EC  325 mg Oral Daily  . calcium acetate  2,668 mg Oral TID WC  . carbidopa-levodopa  1 tablet Oral TID  . carvedilol  12.5 mg Oral QHS  . docusate sodium  100 mg Oral BID  . feeding supplement (ENSURE ENLIVE)  237 mL Oral BID BM  . heparin  5,000 Units Subcutaneous 3 times per day  . levETIRAcetam  250 mg Oral BID  . levothyroxine  50 mcg Oral QAC breakfast  . multivitamin  1 tablet Oral q morning - 10a  . pantoprazole  40 mg Oral BID  . traZODone  25 mg Oral QHS     Infusions:     PRN Medications:  acetaminophen, gi cocktail, ondansetron (ZOFRAN) IV, traMADol   Past Medical History  Diagnosis Date  . Hypertension   . Anemia   . CHF (congestive heart failure)   . IBS (irritable bowel syndrome)   . Hyperlipidemia   . Myocardial infarction 2004  . Childhood asthma   . Type II diabetes mellitus   . Hypothyroidism   . History of blood transfusion 1996    "related to menses"  . GERD (gastroesophageal reflux disease)   . Headache     "a few times/week" (10/30/2014)  . Osteoarthritis   . Chronic upper back pain   . ESRD (end stage renal disease) on dialysis     "TTS; [facility] in [place]" (10/30/2014)    Past Surgical History  Procedure Laterality Date  . Appendectomy    .  Av fistula placement Left 12/03/2006    "forearm"  . Av fistula repair Left 11/25/2009    "forearm"  . Insertion of dialysis catheter Right 2010    "chest"  . Revison of arteriovenous fistula Left 12/04/2013    Procedure: REVISON OF ARTERIOVENOUS FISTULA;  Surgeon: Rosetta Posner, MD;  Location: [facility];  Service: Vascular;  Laterality: Left;  . Cholecystectomy open  1970's  . Hernia repair    . Umbilical hernia repair  1990's X 2  . Tubal ligation  1989  . Parathyroidectomy  2010?    Parathyroid autotransplantation     History reviewed. No pertinent family history.  Social History Roberta Bryant reports that she has quit smoking. Her smoking use included Cigarettes. She quit after 3 years of use. She has never used smokeless tobacco. Roberta Bryant reports that she does not drink alcohol.  Review of Systems  Patient is stable at this time. She denies fever, chills, headache, sweats, rash, change in vision, change in hearing, cough, nausea vomiting. All other systems are reviewed and are negative.  Physical Examination Blood pressure 130/62, pulse 63, temperature 98.5 F (36.9 C), temperature source Oral, resp. rate 18, height 4\' 10"  (1.473 m), weight 207 lb 1.6 oz (93.94 kg), SpO2 98 %.  Intake/Output Summary (Last 24 hours) at 10/31/14 0835 Last data filed at 10/31/14 0741  Gross per 24 hour  Intake    360 ml  Output    190 ml  Net    170 ml   Patient is oriented to person time and place. Affect is normal. She is overweight. Head is atraumatic. Sclera and conjunctiva are normal. There is no jugulovenous distention. Lungs reveal scattered rhonchi. Cardiac exam reveals an S1 and S2. Abdomen is soft. There is no significant peripheral edema.  Prior Cardiac Testing/Procedures  Lab Results  Basic Metabolic Panel:  Recent Labs Lab 10/30/14 1343  NA 135  K 4.5  CL 92*  CO2 30  GLUCOSE 135*  BUN 22*  CREATININE 5.09*  CALCIUM 9.0    Liver Function Tests:  Recent  Labs Lab 10/30/14 1343  AST 22  ALT 14  ALKPHOS 70  BILITOT 0.7  PROT 8.2*  ALBUMIN 4.0    CBC:  Recent Labs Lab 10/30/14 1343  WBC 10.7*  NEUTROABS 8.0*  HGB 13.2  HCT 38.5  MCV 103.2*  PLT 207    Cardiac Enzymes:  Recent Labs Lab 10/30/14 2057 10/31/14 0122  TROPONINI <0.03 <0.03    BNP: Invalid input(s): POCBNP   Radiology: Dg Chest 2 View  10/30/2014   CLINICAL DATA:  Chest and upper back pain, shortness of Breath  EXAM: CHEST  2 VIEW  COMPARISON:  06/11/2013  FINDINGS: Cardiac shadow is within normal limits. The lungs are clear bilaterally. Old rib fractures again noted of the left seventh rib posterior laterally. No acute abnormality is noted.  IMPRESSION: No active cardiopulmonary disease.   Electronically Signed   By: Inez Catalina M.D.   On: 10/30/2014 13:56     ECG:  I reviewed the EKGs. There are nonspecific ST-T wave changes.  Telemetry:     I have reviewed telemetry today Oct 31, 2014.  There is normal sinus rhythm.   Impression and Recommendations    Abnormal EKG     The patient's EKG changes are nonspecific. Troponins are normal. I agree that no further cardiac workup is needed. It will be helpful to obtain information from her local cardiologist in [place]. Cardiology will sign off.   Daryel November, MD 10/31/2014, 8:35 AM

## 2014-10-31 NOTE — Progress Notes (Signed)
   10/31/14 1055  Spiritual Encounters  Spiritual Needs Prayer;Emotional;Other (Comment)  Chaplain responding to spiritual consult from RN.  Patient was dozing, but noticed chaplain as he was leaving. Offered to come back, but patient asked for prayer. Patient expressed anxiety about being in hospital and is still awaiting medical diagnosis. Chaplain made attending RN aware.

## 2014-10-31 NOTE — Progress Notes (Signed)
BP prior to ambulation was 132/71 and 123/73 after ambulation. Pt ambulated without difficulty. Pt son was present and assisted with ambulation.

## 2014-11-01 DIAGNOSIS — R9431 Abnormal electrocardiogram [ECG] [EKG]: Secondary | ICD-10-CM | POA: Insufficient documentation

## 2014-11-01 DIAGNOSIS — R079 Chest pain, unspecified: Secondary | ICD-10-CM | POA: Insufficient documentation

## 2014-12-24 DIAGNOSIS — Z992 Dependence on renal dialysis: Secondary | ICD-10-CM | POA: Insufficient documentation

## 2014-12-24 DIAGNOSIS — E785 Hyperlipidemia, unspecified: Secondary | ICD-10-CM | POA: Insufficient documentation

## 2015-03-28 ENCOUNTER — Emergency Department (HOSPITAL_COMMUNITY): Payer: Medicare Other

## 2015-03-28 ENCOUNTER — Encounter (HOSPITAL_COMMUNITY): Payer: Self-pay | Admitting: General Practice

## 2015-03-28 ENCOUNTER — Emergency Department (HOSPITAL_COMMUNITY)
Admission: EM | Admit: 2015-03-28 | Discharge: 2015-03-28 | Disposition: A | Discharge status: Home / Self Care | Payer: Medicare Other | Attending: Physician Assistant | Admitting: Physician Assistant

## 2015-03-28 DIAGNOSIS — I252 Old myocardial infarction: Secondary | ICD-10-CM | POA: Diagnosis not present

## 2015-03-28 DIAGNOSIS — M25511 Pain in right shoulder: Secondary | ICD-10-CM | POA: Diagnosis present

## 2015-03-28 DIAGNOSIS — E039 Hypothyroidism, unspecified: Secondary | ICD-10-CM | POA: Diagnosis not present

## 2015-03-28 DIAGNOSIS — Z880 Allergy status to penicillin: Secondary | ICD-10-CM | POA: Diagnosis not present

## 2015-03-28 DIAGNOSIS — Z7982 Long term (current) use of aspirin: Secondary | ICD-10-CM | POA: Diagnosis not present

## 2015-03-28 DIAGNOSIS — N186 End stage renal disease: Secondary | ICD-10-CM | POA: Diagnosis not present

## 2015-03-28 DIAGNOSIS — M1990 Unspecified osteoarthritis, unspecified site: Secondary | ICD-10-CM | POA: Diagnosis not present

## 2015-03-28 DIAGNOSIS — E119 Type 2 diabetes mellitus without complications: Secondary | ICD-10-CM | POA: Insufficient documentation

## 2015-03-28 DIAGNOSIS — Z87891 Personal history of nicotine dependence: Secondary | ICD-10-CM | POA: Insufficient documentation

## 2015-03-28 DIAGNOSIS — M898X1 Other specified disorders of bone, shoulder: Secondary | ICD-10-CM

## 2015-03-28 DIAGNOSIS — J45901 Unspecified asthma with (acute) exacerbation: Secondary | ICD-10-CM | POA: Diagnosis not present

## 2015-03-28 DIAGNOSIS — Z862 Personal history of diseases of the blood and blood-forming organs and certain disorders involving the immune mechanism: Secondary | ICD-10-CM | POA: Insufficient documentation

## 2015-03-28 DIAGNOSIS — M546 Pain in thoracic spine: Secondary | ICD-10-CM | POA: Diagnosis not present

## 2015-03-28 DIAGNOSIS — G8929 Other chronic pain: Secondary | ICD-10-CM | POA: Insufficient documentation

## 2015-03-28 DIAGNOSIS — I120 Hypertensive chronic kidney disease with stage 5 chronic kidney disease or end stage renal disease: Secondary | ICD-10-CM | POA: Insufficient documentation

## 2015-03-28 DIAGNOSIS — I509 Heart failure, unspecified: Secondary | ICD-10-CM | POA: Diagnosis not present

## 2015-03-28 DIAGNOSIS — K219 Gastro-esophageal reflux disease without esophagitis: Secondary | ICD-10-CM | POA: Insufficient documentation

## 2015-03-28 DIAGNOSIS — Z992 Dependence on renal dialysis: Secondary | ICD-10-CM | POA: Insufficient documentation

## 2015-03-28 DIAGNOSIS — M549 Dorsalgia, unspecified: Secondary | ICD-10-CM

## 2015-03-28 LAB — BASIC METABOLIC PANEL
ANION GAP: 12 (ref 5–15)
BUN: 52 mg/dL — ABNORMAL HIGH (ref 6–20)
CALCIUM: 8.3 mg/dL — AB (ref 8.9–10.3)
CO2: 28 mmol/L (ref 22–32)
Chloride: 96 mmol/L — ABNORMAL LOW (ref 101–111)
Creatinine, Ser: 8.22 mg/dL — ABNORMAL HIGH (ref 0.44–1.00)
GFR calc Af Amer: 5 mL/min — ABNORMAL LOW (ref >60)
GFR, EST NON AFRICAN AMERICAN: 5 mL/min — AB (ref >60)
Glucose, Bld: 162 mg/dL — ABNORMAL HIGH (ref 65–99)
Potassium: 5.1 mmol/L (ref 3.5–5.1)
Sodium: 136 mmol/L (ref 135–145)

## 2015-03-28 LAB — CBC
HCT: 33.6 % — ABNORMAL LOW (ref 36.0–46.0)
Hemoglobin: 11 g/dL — ABNORMAL LOW (ref 12.0–15.0)
MCH: 35.6 pg — AB (ref 26.0–34.0)
MCHC: 32.7 g/dL (ref 30.0–36.0)
MCV: 108.7 fL — AB (ref 78.0–100.0)
Platelets: 201 10*3/uL (ref 150–400)
RBC: 3.09 MIL/uL — ABNORMAL LOW (ref 3.87–5.11)
RDW: 13.6 % (ref 11.5–15.5)
WBC: 6.1 10*3/uL (ref 4.0–10.5)

## 2015-03-28 LAB — I-STAT TROPONIN, ED: Troponin i, poc: 0.01 ng/mL (ref 0.00–0.08)

## 2015-03-28 LAB — BRAIN NATRIURETIC PEPTIDE: B Natriuretic Peptide: 147.1 pg/mL — ABNORMAL HIGH (ref 0.0–100.0)

## 2015-03-28 MED ORDER — DIAZEPAM 5 MG PO TABS
10.0000 mg | ORAL_TABLET | Freq: Once | ORAL | Status: AC
  Administered 2015-03-28: 10 mg via ORAL
  Filled 2015-03-28: qty 2

## 2015-03-28 MED ORDER — OXYCODONE-ACETAMINOPHEN 5-325 MG PO TABS
2.0000 | ORAL_TABLET | Freq: Once | ORAL | Status: AC
  Administered 2015-03-28: 2 via ORAL
  Filled 2015-03-28: qty 2

## 2015-03-28 MED ORDER — METHOCARBAMOL 500 MG PO TABS
500.0000 mg | ORAL_TABLET | Freq: Two times a day (BID) | ORAL | Status: DC
Start: 2015-03-28 — End: 2015-11-23

## 2015-03-28 NOTE — ED Provider Notes (Signed)
CSN: [id number]     Arrival date & time 03/28/15  [id number] History   First MD Initiated Contact with Patient 03/28/15 [phone]     Chief Complaint  Patient presents with  . Shortness of Breath  . Shoulder Pain     (Consider location/radiation/quality/duration/timing/severity/associated sxs/prior Treatment) The history is provided by the patient, medical records and a relative. No language interpreter was used.     Roberta Bryant is a 64 y.o. female  with a hx of ESRD on dialysis (Tuesday, Thursday, Saturday), hypertension, CHF, MI, GERD, chronic upper back pain, osteoarthritis presents to the Emergency Department complaining of gradual, persistent, progressively worsening right scapular pain onset approximately 8 PM last night. Patient rates the pain at a 10/10 and described as sometimes dull and sometimes sharp. Patient reports that when the pain becomes sharp she feels associated shortness of breath but this resolves with a decrease in her pain. She reports that this type of pain is typical for her however her symptoms have been worse in the last 12 hours than usual.  Patient reports taking Tylenol at home with complete relief but the pain has returned. No treatments this morning. Patient reports she's been evaluated for this in the past but has had no diagnosis. She reports she is usually discharged home with Percocet but the pain returns. She has not discussed this issue with her primary care or her orthopedist. Patient follows with Dr. Ninfa Linden of [facility] orthopedics. Patient reports that movement and palpation makes her pain worse. Lying still makes that some better. She denies fever, chills, cough, chest pain, dyspnea on exertion, leg swelling.  Patient reports that she helped her son in the yard yesterday and lifted and carried sticks. She reports the pain began after that.    Past Medical History  Diagnosis Date  . Hypertension   . Anemia   . CHF (congestive heart failure) ([place])   . IBS  (irritable bowel syndrome)   . Hyperlipidemia   . Myocardial infarction ([place]) 2004  . Childhood asthma   . Type II diabetes mellitus ([place])   . Hypothyroidism   . History of blood transfusion 1996    "related to menses"  . GERD (gastroesophageal reflux disease)   . Headache     "a few times/week" (10/30/2014)  . Osteoarthritis   . Chronic upper back pain   . ESRD (end stage renal disease) on dialysis ([place])     "TTS; [facility] in [place]" (10/30/2014)   Past Surgical History  Procedure Laterality Date  . Appendectomy    . Av fistula placement Left 12/03/2006    "forearm"  . Av fistula repair Left 11/25/2009    "forearm"  . Insertion of dialysis catheter Right 2010    "chest"  . Revison of arteriovenous fistula Left 12/04/2013    Procedure: REVISON OF ARTERIOVENOUS FISTULA;  Surgeon: Rosetta Posner, MD;  Location: [facility];  Service: Vascular;  Laterality: Left;  . Cholecystectomy open  1970's  . Hernia repair    . Umbilical hernia repair  1990's X 2  . Tubal ligation  1989  . Parathyroidectomy  2010?    Parathyroid autotransplantation    No family history on file. Social History  Substance Use Topics  . Smoking status: Former Smoker -- 3 years    Types: Cigarettes  . Smokeless tobacco: Never Used     Comment: "stopped smoking in the 1970's"  . Alcohol Use: No   OB History    No data available  Review of Systems  Constitutional: Negative for fever, diaphoresis, appetite change, fatigue and unexpected weight change.  HENT: Negative for mouth sores.   Eyes: Negative for visual disturbance.  Respiratory: Positive for shortness of breath. Negative for cough, chest tightness and wheezing.   Cardiovascular: Negative for chest pain.  Gastrointestinal: Negative for nausea, vomiting, abdominal pain, diarrhea and constipation.  Endocrine: Negative for polydipsia, polyphagia and polyuria.  Genitourinary: Negative for dysuria, urgency, frequency and hematuria.  Musculoskeletal:  Positive for arthralgias (right posterior shoulder). Negative for back pain and neck stiffness.  Skin: Negative for rash.  Allergic/Immunologic: Negative for immunocompromised state.  Neurological: Negative for syncope, light-headedness and headaches.  Hematological: Does not bruise/bleed easily.  Psychiatric/Behavioral: Negative for sleep disturbance. The patient is not nervous/anxious.       Allergies  Sulfa antibiotics; Amoxicillin; Chlorphen-phenyleph-asa; Ibuprofen; and Naldecon senior  Home Medications   Prior to Admission medications   Medication Sig Start Date End Date Taking? Authorizing Provider  acetaminophen (TYLENOL) 500 MG tablet Take 500 mg by mouth every 6 (six) hours as needed for mild pain.   Yes Historical Provider, MD  aspirin EC 81 MG tablet Take 81 mg by mouth every morning.   Yes Historical Provider, MD  BIOTIN PO Take 1 tablet by mouth daily. 10,082mcg   Yes Historical Provider, MD  calcium acetate (PHOSLO) 667 MG capsule Take 1,334-2,668 mg by mouth 3 (three) times daily with meals. Takes 4 capsules three times daily before each meal.   Yes Historical Provider, MD  carbidopa-levodopa (SINEMET) 25-100 MG per tablet Take 1 tablet by mouth 4 (four) times daily.  02/21/14 03/28/15 Yes Historical Provider, MD  carvedilol (COREG) 12.5 MG tablet Take 25 mg by mouth at bedtime.    Yes Historical Provider, MD  diclofenac sodium (VOLTAREN) 1 % GEL Apply 4 g topically 4 (four) times daily as needed (pain).    Yes Historical Provider, MD  dicyclomine (BENTYL) 10 MG capsule Take 10 mg by mouth 4 (four) times daily -  before meals and at bedtime.    Yes Historical Provider, MD  docusate sodium (COLACE) 100 MG capsule Take 100 mg by mouth 2 (two) times daily.   Yes Historical Provider, MD  fenofibrate (TRICOR) 48 MG tablet Take 48 mg by mouth daily.   Yes Historical Provider, MD  finasteride (PROSCAR) 5 MG tablet Take 2.5 mg by mouth daily.   Yes Historical Provider, MD   isosorbide mononitrate (IMDUR) 30 MG 24 hr tablet Take 30 mg by mouth daily.   Yes Historical Provider, MD  levothyroxine (SYNTHROID, LEVOTHROID) 50 MCG tablet Take 50 mcg by mouth daily before breakfast.    Yes Historical Provider, MD  loperamide (IMODIUM) 2 MG capsule Take 2 mg by mouth as needed for diarrhea or loose stools.   Yes Historical Provider, MD  meclizine (ANTIVERT) 25 MG tablet Take 12.5-25 mg by mouth 3 (three) times daily as needed for dizziness.    Yes Historical Provider, MD  multivitamin (RENA-VIT) TABS tablet Take 1 tablet by mouth every morning.   Yes Historical Provider, MD  Omega-3 Fatty Acids (FISH OIL) 1000 MG CAPS Take 2 capsules by mouth daily.   Yes Historical Provider, MD  omeprazole (PRILOSEC) 20 MG capsule Take 20 mg by mouth 2 (two) times daily before a meal.   Yes Historical Provider, MD  prochlorperazine (COMPAZINE) 5 MG tablet Take 5 mg by mouth every 8 (eight) hours as needed for nausea or vomiting.    Yes Historical Provider, MD  traMADol (ULTRAM) 50 MG tablet Take 50 mg by mouth every 6 (six) hours as needed for moderate pain.    Yes Historical Provider, MD  Zinc 50 MG CAPS Take 1 capsule by mouth every morning.    Yes Historical Provider, MD  cyclobenzaprine (FLEXERIL) 5 MG tablet Take 1 tablet (5 mg total) by mouth at bedtime. Patient not taking: Reported on 10/30/2014 06/03/14   Harvie Heck, PA-C  methocarbamol (ROBAXIN) 500 MG tablet Take 1 tablet (500 mg total) by mouth 2 (two) times daily. 03/28/15   Alyssamarie Mounsey, PA-C  oxyCODONE-acetaminophen (PERCOCET/ROXICET) 5-325 MG per tablet Take 1-2 tablets by mouth every 4 (four) hours as needed for moderate pain. Patient not taking: Reported on 10/30/2014 05/14/14   Noland Fordyce, PA-C  oxyCODONE-acetaminophen (PERCOCET/ROXICET) 5-325 MG per tablet Take 1 tablet by mouth every 6 (six) hours as needed for moderate pain or severe pain. Patient not taking: Reported on 10/30/2014 06/03/14   Harvie Heck, PA-C    BP 129/68 mmHg  Pulse 57  Temp(Src) 98.3 F (36.8 C) (Oral)  Resp 21  Ht 4\' 11"  (1.499 m)  Wt 210 lb (95.255 kg)  BMI 42.39 kg/m2  SpO2 96% Physical Exam  Constitutional: She appears well-developed and well-nourished. No distress.  Awake, alert, nontoxic appearance  HENT:  Head: Normocephalic and atraumatic.  Mouth/Throat: Oropharynx is clear and moist. No oropharyngeal exudate.  Eyes: Conjunctivae are normal. No scleral icterus.  Neck: Normal range of motion. Neck supple.  Cardiovascular: Normal rate, regular rhythm, normal heart sounds and intact distal pulses.   No murmur heard. Pulmonary/Chest: Effort normal and breath sounds normal. No respiratory distress. She has no wheezes.  Equal chest expansion Clear and equal breath sounds  Abdominal: Soft. Bowel sounds are normal. She exhibits no mass. There is no tenderness. There is no rebound and no guarding.  Soft and nontender  Musculoskeletal: Normal range of motion. She exhibits no edema.  Full range of motion of the bilateral shoulders with pain in the right shoulder. Tenderness to palpation over the medial scapular border and inferior scapular border No tenderness to palpation of the midline T-spine or L-spine No tenderness to palpation to the trapezius No deformity Fistula present in the left arm with palpable thrill, site clean without erythema or induration  Neurological: She is alert.  Speech is clear and goal oriented Moves extremities without ataxia Sensation intact to dull and sharp in all 4 extremities Strength 5/5 in upper and lower extremity bilaterally including strong grip strength  Skin: Skin is warm and dry. She is not diaphoretic.  No vesicular rash, erythema or lesions over the area of tenderness and question   Psychiatric: She has a normal mood and affect.  Nursing note and vitals reviewed.   ED Course  Procedures (including critical care time) Labs Review Labs Reviewed  BRAIN NATRIURETIC  PEPTIDE - Abnormal; Notable for the following:    B Natriuretic Peptide 147.1 (*)    All other components within normal limits  CBC - Abnormal; Notable for the following:    RBC 3.09 (*)    Hemoglobin 11.0 (*)    HCT 33.6 (*)    MCV 108.7 (*)    MCH 35.6 (*)    All other components within normal limits  BASIC METABOLIC PANEL - Abnormal; Notable for the following:    Chloride 96 (*)    Glucose, Bld 162 (*)    BUN 52 (*)    Creatinine, Ser 8.22 (*)    Calcium 8.3 (*)  GFR calc non Af Amer 5 (*)    GFR calc Af Amer 5 (*)    All other components within normal limits  I-STAT TROPOININ, ED    Imaging Review Dg Chest 2 View  03/28/2015  CLINICAL DATA:  Right chest and shoulder pain beginning yesterday. Shortness of breath. Previous myocardial infarct. EXAM: CHEST  2 VIEW COMPARISON:  None. FINDINGS: The heart size and mediastinal contours are within normal limits. Both lungs are clear. The visualized skeletal structures are unremarkable. IMPRESSION: No active cardiopulmonary disease. Electronically Signed   By: Earle Gell M.D.   On: 03/28/2015 10:14   I have personally reviewed and evaluated these images and lab results as part of my medical decision-making.   EKG Interpretation   Date/Time:  Thursday March 28 2015 09:01:47 EDT Ventricular Rate:  60 PR Interval:  205 QRS Duration: 95 QT Interval:  486 QTC Calculation: 486 R Axis:   73 Text Interpretation:  Sinus rhythm Repol abnrm suggests ischemia, lateral  leads no acute ischemia, good R wave progression No significant change  since last tracing Confirmed by Gerald Leitz ([id number]) on 03/28/2015  9:17:03 AM      MDM   Final diagnoses:  Pain of right scapula  Upper back pain   Roberta Bryant presents with acute exacerbation of her chronic bilateral shoulder and upper back pain. Patient was working in the yard yesterday which is likely the reason for her exacerbation. We'll give Percocet and Valium.  11:08  AM Patient reports feeling significant better. She's had complete resolution of her pain. No return of her shortness of breath with resolution of her pain.  Labs are reassuring. Her EKG is nonischemic. BNP at 147. Serum creatinine is elevated at 8.22 today but no evidence of hyperkalemia. Patient will leave the emergency department to attend dialysis. Negative troponin. Chest x-ray is without widened mediastinum, rib fractures, clavicle deformity or evidence of pneumonia or pneumothorax.  Doubt aortic dissection.  Recommend close follow-up with primary care and orthopedics. Discussed reasons to return to the emergency department including dyspnea on exertion, developed chest pain, worsening or tearing back pain or other concerns.  BP 129/68 mmHg  Pulse 57  Temp(Src) 98.3 F (36.8 C) (Oral)  Resp 21  Ht 4\' 11"  (1.499 m)  Wt 210 lb (95.255 kg)  BMI 42.39 kg/m2  SpO2 96%   Abigail Butts, PA-C 03/28/15 [place], MD 03/28/15 1627

## 2015-03-28 NOTE — Discharge Instructions (Signed)
1. Medications: robaxin, usual home medications 2. Treatment: rest, drink plenty of fluids, gentle stretching 3. Follow Up: Please followup with your orthopedics in 7-10 days for discussion of your diagnoses and further evaluation after today's visit; if you do not have a primary care doctor use the resource guide provided to find one; Please return to the ER for worsening symptoms

## 2015-03-28 NOTE — ED Notes (Signed)
Pt complaining of SOB that started last night around 2000. Pt also reporting bilateral shoulder blade pain, rating pain a 10/10. Pt describes pain as a dull to sharp pain. Pt has had shoulder blade pain for 2 years, but reports that it got progressively worse last night. Pt denies any CP, or N/V. Pt is A/O. Pt goes to HD on Tuesday, Thursday, and Saturdays.

## 2015-09-23 ENCOUNTER — Other Ambulatory Visit: Payer: Self-pay | Admitting: Orthopaedic Surgery

## 2015-09-23 DIAGNOSIS — M545 Low back pain: Secondary | ICD-10-CM

## 2015-09-29 ENCOUNTER — Ambulatory Visit
Admission: RE | Admit: 2015-09-29 | Discharge: 2015-09-29 | Disposition: A | Discharge status: Home / Self Care | Payer: Medicare Other | Source: Ambulatory Visit | Attending: Orthopaedic Surgery | Admitting: Orthopaedic Surgery

## 2015-09-29 DIAGNOSIS — M545 Low back pain: Secondary | ICD-10-CM

## 2015-10-07 ENCOUNTER — Ambulatory Visit: Payer: Medicare Other | Admitting: Podiatry

## 2015-11-23 ENCOUNTER — Encounter (HOSPITAL_COMMUNITY): Payer: Self-pay

## 2015-11-23 ENCOUNTER — Emergency Department (HOSPITAL_COMMUNITY): Payer: Medicare Other

## 2015-11-23 ENCOUNTER — Inpatient Hospital Stay (HOSPITAL_COMMUNITY)
Admission: EM | Admit: 2015-11-23 | Discharge: 2015-11-24 | DRG: 313 | Disposition: A | Discharge status: Home / Self Care | Payer: Medicare Other | Attending: Family Medicine | Admitting: Family Medicine

## 2015-11-23 ENCOUNTER — Other Ambulatory Visit: Payer: Self-pay

## 2015-11-23 DIAGNOSIS — D649 Anemia, unspecified: Secondary | ICD-10-CM | POA: Diagnosis present

## 2015-11-23 DIAGNOSIS — I132 Hypertensive heart and chronic kidney disease with heart failure and with stage 5 chronic kidney disease, or end stage renal disease: Secondary | ICD-10-CM | POA: Diagnosis present

## 2015-11-23 DIAGNOSIS — E781 Pure hyperglyceridemia: Secondary | ICD-10-CM | POA: Diagnosis present

## 2015-11-23 DIAGNOSIS — G20 Parkinson's disease: Secondary | ICD-10-CM | POA: Diagnosis not present

## 2015-11-23 DIAGNOSIS — Z87891 Personal history of nicotine dependence: Secondary | ICD-10-CM

## 2015-11-23 DIAGNOSIS — E1122 Type 2 diabetes mellitus with diabetic chronic kidney disease: Secondary | ICD-10-CM | POA: Diagnosis present

## 2015-11-23 DIAGNOSIS — N186 End stage renal disease: Secondary | ICD-10-CM | POA: Diagnosis present

## 2015-11-23 DIAGNOSIS — E118 Type 2 diabetes mellitus with unspecified complications: Secondary | ICD-10-CM

## 2015-11-23 DIAGNOSIS — R079 Chest pain, unspecified: Secondary | ICD-10-CM | POA: Diagnosis present

## 2015-11-23 DIAGNOSIS — E8889 Other specified metabolic disorders: Secondary | ICD-10-CM | POA: Diagnosis not present

## 2015-11-23 DIAGNOSIS — E785 Hyperlipidemia, unspecified: Secondary | ICD-10-CM | POA: Diagnosis present

## 2015-11-23 DIAGNOSIS — K589 Irritable bowel syndrome without diarrhea: Secondary | ICD-10-CM | POA: Diagnosis present

## 2015-11-23 DIAGNOSIS — Z66 Do not resuscitate: Secondary | ICD-10-CM | POA: Diagnosis present

## 2015-11-23 DIAGNOSIS — I10 Essential (primary) hypertension: Secondary | ICD-10-CM | POA: Diagnosis not present

## 2015-11-23 DIAGNOSIS — E039 Hypothyroidism, unspecified: Secondary | ICD-10-CM | POA: Diagnosis present

## 2015-11-23 DIAGNOSIS — Z881 Allergy status to other antibiotic agents status: Secondary | ICD-10-CM

## 2015-11-23 DIAGNOSIS — K219 Gastro-esophageal reflux disease without esophagitis: Secondary | ICD-10-CM | POA: Diagnosis present

## 2015-11-23 DIAGNOSIS — Z888 Allergy status to other drugs, medicaments and biological substances status: Secondary | ICD-10-CM

## 2015-11-23 DIAGNOSIS — Z79899 Other long term (current) drug therapy: Secondary | ICD-10-CM

## 2015-11-23 DIAGNOSIS — Z7982 Long term (current) use of aspirin: Secondary | ICD-10-CM

## 2015-11-23 DIAGNOSIS — I2510 Atherosclerotic heart disease of native coronary artery without angina pectoris: Secondary | ICD-10-CM | POA: Diagnosis present

## 2015-11-23 DIAGNOSIS — R0789 Other chest pain: Principal | ICD-10-CM | POA: Diagnosis present

## 2015-11-23 DIAGNOSIS — Z992 Dependence on renal dialysis: Secondary | ICD-10-CM

## 2015-11-23 DIAGNOSIS — Z6841 Body Mass Index (BMI) 40.0 and over, adult: Secondary | ICD-10-CM

## 2015-11-23 DIAGNOSIS — I252 Old myocardial infarction: Secondary | ICD-10-CM | POA: Diagnosis not present

## 2015-11-23 DIAGNOSIS — R7989 Other specified abnormal findings of blood chemistry: Secondary | ICD-10-CM

## 2015-11-23 DIAGNOSIS — I5030 Unspecified diastolic (congestive) heart failure: Secondary | ICD-10-CM | POA: Diagnosis present

## 2015-11-23 DIAGNOSIS — N2581 Secondary hyperparathyroidism of renal origin: Secondary | ICD-10-CM | POA: Diagnosis present

## 2015-11-23 DIAGNOSIS — R778 Other specified abnormalities of plasma proteins: Secondary | ICD-10-CM

## 2015-11-23 LAB — HEPATIC FUNCTION PANEL
ALBUMIN: 3.6 g/dL (ref 3.5–5.0)
ALK PHOS: 65 U/L (ref 38–126)
AST: 19 U/L (ref 15–41)
Bilirubin, Direct: <0.1 mg/dL — ABNORMAL LOW (ref 0.1–0.5)
Total Bilirubin: 0.4 mg/dL (ref 0.3–1.2)
Total Protein: 7 g/dL (ref 6.5–8.1)

## 2015-11-23 LAB — CBC
HCT: 32.5 % — ABNORMAL LOW (ref 36.0–46.0)
Hemoglobin: 10.6 g/dL — ABNORMAL LOW (ref 12.0–15.0)
MCH: 34.4 pg — AB (ref 26.0–34.0)
MCHC: 32.6 g/dL (ref 30.0–36.0)
MCV: 105.5 fL — ABNORMAL HIGH (ref 78.0–100.0)
Platelets: 266 10*3/uL (ref 150–400)
RBC: 3.08 MIL/uL — ABNORMAL LOW (ref 3.87–5.11)
RDW: 12.9 % (ref 11.5–15.5)
WBC: 6.9 10*3/uL (ref 4.0–10.5)

## 2015-11-23 LAB — BASIC METABOLIC PANEL
ANION GAP: 13 (ref 5–15)
BUN: 60 mg/dL — ABNORMAL HIGH (ref 6–20)
CALCIUM: 9.3 mg/dL (ref 8.9–10.3)
CO2: 25 mmol/L (ref 22–32)
Chloride: 97 mmol/L — ABNORMAL LOW (ref 101–111)
Creatinine, Ser: 9.42 mg/dL — ABNORMAL HIGH (ref 0.44–1.00)
GFR, EST AFRICAN AMERICAN: 4 mL/min — AB (ref >60)
GFR, EST NON AFRICAN AMERICAN: 4 mL/min — AB (ref >60)
Glucose, Bld: 163 mg/dL — ABNORMAL HIGH (ref 65–99)
Potassium: 4.4 mmol/L (ref 3.5–5.1)
Sodium: 135 mmol/L (ref 135–145)

## 2015-11-23 LAB — I-STAT TROPONIN, ED: TROPONIN I, POC: 0.03 ng/mL (ref 0.00–0.08)

## 2015-11-23 LAB — BRAIN NATRIURETIC PEPTIDE: B Natriuretic Peptide: 124.1 pg/mL — ABNORMAL HIGH (ref 0.0–100.0)

## 2015-11-23 LAB — TROPONIN I
TROPONIN I: 0.05 ng/mL — AB (ref <0.031)
TROPONIN I: 0.03 ng/mL (ref <0.031)
Troponin I: 0.04 ng/mL — ABNORMAL HIGH (ref <0.031)

## 2015-11-23 LAB — LIPASE, BLOOD: Lipase: 42 U/L (ref 11–51)

## 2015-11-23 LAB — GLUCOSE, CAPILLARY: GLUCOSE-CAPILLARY: 140 mg/dL — AB (ref 65–99)

## 2015-11-23 MED ORDER — SODIUM CHLORIDE 0.9 % IV SOLN: 100.0000 mL | INTRAVENOUS | Status: DC | PRN

## 2015-11-23 MED ORDER — RENA-VITE PO TABS
1.0000 | ORAL_TABLET | Freq: Every morning | ORAL | Status: DC
  Administered 2015-11-23 – 2015-11-24 (×2): 1 via ORAL
  Filled 2015-11-23 (×2): qty 1

## 2015-11-23 MED ORDER — LIDOCAINE HCL (PF) 1 % IJ SOLN: 5.0000 mL | INTRAMUSCULAR | Status: DC | PRN

## 2015-11-23 MED ORDER — HEPARIN SODIUM (PORCINE) 1000 UNIT/ML DIALYSIS
1000.0000 [IU] | INTRAMUSCULAR | Status: DC | PRN
  Filled 2015-11-23: qty 1

## 2015-11-23 MED ORDER — ACETAMINOPHEN 325 MG PO TABS
650.0000 mg | ORAL_TABLET | ORAL | Status: DC | PRN
  Administered 2015-11-23: 650 mg via ORAL

## 2015-11-23 MED ORDER — CALCIUM ACETATE (PHOS BINDER) 667 MG PO CAPS
2668.0000 mg | ORAL_CAPSULE | Freq: Three times a day (TID) | ORAL | Status: DC
  Administered 2015-11-23 – 2015-11-24 (×2): 2668 mg via ORAL
  Filled 2015-11-23 (×2): qty 4

## 2015-11-23 MED ORDER — LIDOCAINE-PRILOCAINE 2.5-2.5 % EX CREA
1.0000 "application " | TOPICAL_CREAM | CUTANEOUS | Status: DC | PRN
  Filled 2015-11-23: qty 5

## 2015-11-23 MED ORDER — CARBIDOPA-LEVODOPA 25-100 MG PO TABS
1.0000 | ORAL_TABLET | Freq: Four times a day (QID) | ORAL | Status: DC
  Administered 2015-11-23 – 2015-11-24 (×4): 1 via ORAL
  Filled 2015-11-23 (×4): qty 1

## 2015-11-23 MED ORDER — PANTOPRAZOLE SODIUM 40 MG PO TBEC
40.0000 mg | DELAYED_RELEASE_TABLET | Freq: Every day | ORAL | Status: DC
  Administered 2015-11-24: 40 mg via ORAL
  Filled 2015-11-23: qty 1

## 2015-11-23 MED ORDER — FENOFIBRATE 54 MG PO TABS
54.0000 mg | ORAL_TABLET | Freq: Every day | ORAL | Status: DC
  Filled 2015-11-23 (×2): qty 1

## 2015-11-23 MED ORDER — INSULIN ASPART 100 UNIT/ML ~~LOC~~ SOLN: 0.0000 [IU] | Freq: Three times a day (TID) | SUBCUTANEOUS | Status: DC

## 2015-11-23 MED ORDER — ASPIRIN EC 81 MG PO TBEC
81.0000 mg | DELAYED_RELEASE_TABLET | Freq: Every morning | ORAL | Status: DC
Start: 2015-11-23 — End: 2015-11-24
  Administered 2015-11-23 – 2015-11-24 (×2): 81 mg via ORAL
  Filled 2015-11-23 (×2): qty 1

## 2015-11-23 MED ORDER — ACETAMINOPHEN 325 MG PO TABS
ORAL_TABLET | ORAL | Status: AC
  Filled 2015-11-23: qty 2

## 2015-11-23 MED ORDER — ONDANSETRON HCL 4 MG/2ML IJ SOLN: 4.0000 mg | Freq: Four times a day (QID) | INTRAMUSCULAR | Status: DC | PRN

## 2015-11-23 MED ORDER — HEPARIN SODIUM (PORCINE) 5000 UNIT/ML IJ SOLN
5000.0000 [IU] | Freq: Three times a day (TID) | INTRAMUSCULAR | Status: DC
  Administered 2015-11-23 – 2015-11-24 (×3): 5000 [IU] via SUBCUTANEOUS
  Filled 2015-11-23 (×3): qty 1

## 2015-11-23 MED ORDER — PENTAFLUOROPROP-TETRAFLUOROETH EX AERO: 1.0000 "application " | INHALATION_SPRAY | CUTANEOUS | Status: DC | PRN

## 2015-11-23 MED ORDER — HEPARIN SODIUM (PORCINE) 1000 UNIT/ML DIALYSIS
20.0000 [IU]/kg | INTRAMUSCULAR | Status: DC | PRN
  Filled 2015-11-23: qty 2

## 2015-11-23 MED ORDER — ALTEPLASE 2 MG IJ SOLR: 2.0000 mg | Freq: Once | INTRAMUSCULAR | Status: DC | PRN

## 2015-11-23 MED ORDER — CARVEDILOL 25 MG PO TABS
25.0000 mg | ORAL_TABLET | Freq: Every day | ORAL | Status: DC
  Administered 2015-11-23: 25 mg via ORAL
  Filled 2015-11-23: qty 1

## 2015-11-23 MED ORDER — ISOSORBIDE MONONITRATE ER 30 MG PO TB24
30.0000 mg | ORAL_TABLET | Freq: Every day | ORAL | Status: DC
  Administered 2015-11-23 – 2015-11-24 (×2): 30 mg via ORAL
  Filled 2015-11-23 (×2): qty 1

## 2015-11-23 MED ORDER — LEVOTHYROXINE SODIUM 50 MCG PO TABS
50.0000 ug | ORAL_TABLET | Freq: Every day | ORAL | Status: DC
  Administered 2015-11-24: 50 ug via ORAL
  Filled 2015-11-23: qty 1

## 2015-11-23 NOTE — Consult Note (Signed)
[facility] Renal Consultation Note    Indication for Consultation:  Management of ESRD/hemodialysis; anemia, hypertension/volume and secondary hyperparathyroidism PCP: Dr. Janetta Hora  HPI: Roberta Bryant is a 65 y.o. female with ESRD on TTS in [place] who presents with chest pain/pressure/some SOB  that started after dialysis Thursday and got worse Friday.and last evening.  She didn't want to wake her sons while they were sleeping so she presented with symptoms to dialysis this am where she was referred to the ED for further evaluation given her past medical history.  She has had this pain in the past. She says there is pressure and it goes into her back. She also has pain with palpation along the left sternal border. She has had nausea but no vomiting though she though she was going to vomit. She has chronic IBS and feels like her Parkinson's symptoms are worse that usual.  She was recently started on Januvia for DM. She has troubles at times getting fluid off due to cramping and feels wrung out after her treatments.  Net UF volumes are 2.5 - 4.5 L Thursday, she had less fluid on than usual.  The net UF volume was 2.3 and her post HD weight was 97.3 with post HD BP 0000000 systolic - which is usual for her.  Pre HD BP range from 0000000 -123456 systolic.  She also had thought her symptoms could be due to reflux as she had been out of her PPI for several days due to lack of money. She borrowed some money from her son yesterday and filled her Rx. She took her PPI without relief of symptoms.  She had a similar presentation  5/24 with atypical CP/cramping and negative troponins.  No change was made in her dialysis prescription.  She has told Dr. Justin Mend and Dr. Nancee Liter about this kind of pain in the past.  EKG is negative today and CXR is neg for volume Trop in 0.05 and was < 0.03 last admission.  She said her last heart cath was in 2004. She is due for dialysis today.  Past Medical History   Diagnosis Date  . Hypertension   . Anemia   . CHF (congestive heart failure) ([place])   . IBS (irritable bowel syndrome)   . Hyperlipidemia   . Myocardial infarction ([place]) 2004  . Childhood asthma   . Type II diabetes mellitus ([place])   . Hypothyroidism   . History of blood transfusion 1996    "related to menses"  . GERD (gastroesophageal reflux disease)   . Headache     "a few times/week" (10/30/2014)  . Osteoarthritis   . Chronic upper back pain   . ESRD (end stage renal disease) on dialysis ([place])     "TTS; [facility] in [place]" (10/30/2014)   Past Surgical History  Procedure Laterality Date  . Appendectomy    . Av fistula placement Left 12/03/2006    "forearm"  . Av fistula repair Left 11/25/2009    "forearm"  . Insertion of dialysis catheter Right 2010    "chest"  . Revison of arteriovenous fistula Left 12/04/2013    Procedure: REVISON OF ARTERIOVENOUS FISTULA;  Surgeon: Rosetta Posner, MD;  Location: [facility];  Service: Vascular;  Laterality: Left;  . Cholecystectomy open  1970's  . Hernia repair    . Umbilical hernia repair  1990's X 2  . Tubal ligation  1989  . Parathyroidectomy  2010?    Parathyroid autotransplantation    No family history on  file. Social History:  reports that she has quit smoking. Her smoking use included Cigarettes. She quit after 3 years of use. She has never used smokeless tobacco. She reports that she uses illicit drugs (Marijuana) about 7 times per week. She reports that she does not drink alcohol. Allergies  Allergen Reactions  . Sulfa Antibiotics Anaphylaxis  . Amoxicillin Hives  . Chlorphen-Phenyleph-Asa Rash  . Ibuprofen Hives  . Naldecon Senior [Guaifenesin] Hives   Prior to Admission medications   Medication Sig Start Date End Date Taking? Authorizing Provider  acetaminophen (TYLENOL) 500 MG tablet Take 500 mg by mouth every 6 (six) hours as needed for mild pain.   Yes Historical Provider, MD  aspirin EC 81 MG tablet Take 81 mg by mouth  every morning.   Yes Historical Provider, MD  BIOTIN PO Take 1 tablet by mouth daily. 10,010mcg   Yes Historical Provider, MD  calcium acetate (PHOSLO) 667 MG capsule Take 1,334-2,668 mg by mouth 3 (three) times daily with meals. Takes 4 capsules three times daily before each meal.   Yes Historical Provider, MD  carbidopa-levodopa (SINEMET) 25-100 MG per tablet Take 1 tablet by mouth 4 (four) times daily.  02/21/14 11/23/15 Yes Historical Provider, MD  carvedilol (COREG) 12.5 MG tablet Take 25 mg by mouth at bedtime.    Yes Historical Provider, MD  diclofenac sodium (VOLTAREN) 1 % GEL Apply 4 g topically 4 (four) times daily as needed (pain).    Yes Historical Provider, MD  docusate sodium (COLACE) 100 MG capsule Take 100 mg by mouth 2 (two) times daily.   Yes Historical Provider, MD  fenofibrate (TRICOR) 48 MG tablet Take 48 mg by mouth daily.   Yes Historical Provider, MD  finasteride (PROSCAR) 5 MG tablet Take 2.5 mg by mouth daily.   Yes Historical Provider, MD  isosorbide mononitrate (IMDUR) 30 MG 24 hr tablet Take 30 mg by mouth daily.   Yes Historical Provider, MD  levothyroxine (SYNTHROID, LEVOTHROID) 50 MCG tablet Take 50 mcg by mouth daily before breakfast.    Yes Historical Provider, MD  loperamide (IMODIUM) 2 MG capsule Take 2 mg by mouth as needed for diarrhea or loose stools.   Yes Historical Provider, MD  meclizine (ANTIVERT) 25 MG tablet Take 12.5-25 mg by mouth 3 (three) times daily as needed for dizziness.    Yes Historical Provider, MD  multivitamin (RENA-VIT) TABS tablet Take 1 tablet by mouth every morning.   Yes Historical Provider, MD  Omega-3 Fatty Acids (FISH OIL) 1000 MG CAPS Take 2 capsules by mouth daily.   Yes Historical Provider, MD  omeprazole (PRILOSEC) 20 MG capsule Take 20 mg by mouth 2 (two) times daily before a meal.   Yes Historical Provider, MD  sitaGLIPtin (JANUVIA) 25 MG tablet Take 25 mg by mouth daily.   Yes Historical Provider, MD  Zinc 50 MG CAPS Take 1  capsule by mouth every morning.    Yes Historical Provider, MD  dicyclomine (BENTYL) 10 MG capsule Take 10 mg by mouth 4 (four) times daily -  before meals and at bedtime.     Historical Provider, MD   No current facility-administered medications for this encounter.   Labs: Basic Metabolic Panel:  Recent Labs Lab 11/23/15 0815  NA 135  K 4.4  CL 97*  CO2 25  GLUCOSE 163*  BUN 60*  CREATININE 9.42*  CALCIUM 9.3   Liver Function Tests:  Recent Labs Lab 11/23/15 0849  AST 19  ALT <5*  ALKPHOS 65  BILITOT 0.4  PROT 7.0  ALBUMIN 3.6    Recent Labs Lab 11/23/15 0849  LIPASE 42   CBC:  Recent Labs Lab 11/23/15 0815  WBC 6.9  HGB 10.6*  HCT 32.5*  MCV 105.5*  PLT 266   Cardiac Enzymes:  Recent Labs Lab 11/23/15 1108  TROPONINI 0.05*   CBG: No results for input(s): GLUCAP in the last 168 hours.  Studies/Results: Dg Chest 2 View  11/23/2015  CLINICAL DATA:  Patient with chest pain since Thursday after dialysis. EXAM: CHEST  2 VIEW COMPARISON:  Chest radiograph 06/21/2015. FINDINGS: Stable cardiac and mediastinal contours. Tortuosity of the thoracic aorta. Elevation right hemidiaphragm. No consolidative pulmonary opacities. No pleural effusion or pneumothorax. Old left lateral rib fracture. Thoracic spine degenerative changes. Upper abdominal surgical clips. IMPRESSION: No active cardiopulmonary disease. Electronically Signed   By: Lovey Newcomer M.D.   On: 11/23/2015 09:21    ROS: As per HPI otherwise negative.  Physical Exam: Filed Vitals:   11/23/15 1000 11/23/15 1015 11/23/15 1030 11/23/15 1045  BP: 109/61 125/61 139/83 142/82  Pulse: 61 61 63 61  Temp:      TempSrc:      Resp: 20 14 21 18   Height:      Weight:      SpO2: 97% 96% 94% 96%     General: Obese woman, in no acute distress on room air lying nearly supine Head: Normocephalic, atraumatic, sclera non-icteric, mucus membranes are moist Neck: Supple. JVD not elevated. Lungs: Clear  bilaterally to auscultation without wheezes, rales, or rhonchi. Breathing is unlabored. Heart: RRR with S1 S2. Some reproducible pain LLSB Abdomen: Soft, non-tender, non-distended with normoactive bowel sounds. No rebound/guarding.  Lower extremities:without edema or ischemic changes, no open wounds  Neuro: Alert and oriented X 3. Moves all extremities spontaneously. Psych:  Responds to questions appropriately with a normal affect. Dialysis Access: left upper AVF + bruit/thrill  Dialysis Orders: [facility] 3.5 hours EDW 97 with average UF vol 2.5 - 3.5 2 K 3 Ca 400/600 heparin 4000 with 1500 mid tmt left upper AVF Mircera 50 q 5 weeks last 6/13 no Fe no VDRA Recent labs: hgb 10.6 stable 63% sat iPTH 17 Ca (8.7 - 9.3 on 3 Ca bath P wnl  Assessment/Plan: 1.  chest pain -EKG neg, troponin 0.5 - cycle per primary - CXR neg for volume - raise eg 0.5; additional work up per primary 2.  ESRD -  TTS - HD today; we will keep at 3.5 hours however - if would be to her advantage to extend her time to 4 hours for a more gradual UF rate and possible establish a max UF goal in order to not overly stress her.  Given neg CXR and no volume on exam, I would raise EDW to 97.5 as well 3.  Hypertension/volume  - refill records from [place] show she is on coreg 12.5 bid and 2.5 finasteride 4.  Anemia  - hgb commensurate with outpt Hgb - no ESA needed 5.  Metabolic bone disease -  No on VDRA - actually on 3 Ca bath and takes phoslo- Ca today 9.3 - may be appropriate to change to 2.5 Ca bath at d/c  6.  Nutrition - needs renal carb mod/renavite 7.  DM - on [place], PA-C [place] [phone] 11/23/2015, 1:19 PM   Pt seen, examined and agree w A/P as above.  Kelly Splinter MD [employer] pager  [phone]    cell [phone] 11/23/2015, 6:58 PM

## 2015-11-23 NOTE — Procedures (Signed)
  I was present at this dialysis session, have reviewed the session itself and made  appropriate changes Kelly Splinter MD [place] pager [phone]    cell [phone] 11/23/2015, 6:58 PM

## 2015-11-23 NOTE — Progress Notes (Signed)
Report received via Donnella Bi RN, reviewed VS, meds, and patient's general condition but patient is still in Dialysis, will assess when she arrives back on the unit.

## 2015-11-23 NOTE — H&P (Signed)
[facility] Admission History and Physical Service Pager: [phone]  Patient name: Roberta Bryant Medical record number: [id number] Date of birth: Nov 10, 1950 Age: 65 y.o. Gender: female  Primary Care Provider: Angelina Sheriff., MD Consultants: Nephrology Code Status: DNR  Chief Complaint: Chest pain  Assessment and Plan: Roberta Bryant is a 65 y.o. female presenting with atypical chest pain. PMH is significant for prior MI in 2004, ESRD on HD, HTN, T2DM, HFpEF  Chest Pain. Atypical chest pain, though concern for cardiac etiology given her history and risk factors. HEART score 5 on admission. Reports prior history of MI in 2004. Cardiologist is Dr Nancee Liter in [place]. CXR and EKG negative. Troponin elevated to 0.05. Not currently having chest pain. Wells score 0.  - Admit for ACS rule out / chest pain observation under attending Dr Gwendlyn Deutscher. - Trend troponins - Repeat EKG in AM - Hemoglobin A1c, lipid panel, and TSH pending.   ESRD on HD. HD TTS. Sees Dr Justin Mend outpatient. Dry weight ~97kg. Does not appear overloaded.  - Nephrology consulted for HD - Continue phoslo  HTN / HLD / ?CAD. History of MI in 2004. Significant hypertriglyceridemia last year (TG 695) - Continue ASA 81mg , coreg 25mg  qHS, imdur 30mg  daily,  - Continue tricor 48mg  - Lipid panel pending  HFpEF. History of Grade 1 diastolic dysfunction with preserved EF. No echo on record her. Does not appear volume overloaded on exam. - Fluid management per nephrology with HD - Daily weights  T2DM. Hemoglobin A1c 6.3 last year. ON januvia at home - Hold home Mustang - SSI  Hypothyroidism. TSH 3.7 last year. On levothyroxine 63mcg daily. - Continue home levothyroxine - TSH pending.   Parkinson's Disease.  - Continue home sinemet  FEN/GI: Renal diet, home PPI, SLIV Prophylaxis: SubQ heparin  Disposition: Admitted for chest pain observation. Possible discharge home tomorrow pending  above management.   History of Present Illness:  Roberta Bryant is a 65 y.o. female presenting with chest pain.  Symptoms started 2 days ago after her last HD session. Pain is located in the middle of her chest. Described as a "pressure" type sensation. Pain worsened significantly yesterday. Wanted to go to the ED, but did not have a ride until this morning. Chest pain sometimes radiates into her back. She has not noticed anything that causes the pain. She has not noticed anything that make the pain better or worse. Pain seems to occur at random times. Is not associated with exertion. Associated symptoms include, shortness of breath, nausea, and dizziness. No vomiting.  Was supposed to go to HD today, but when she arrived they wanted her to go to the ED for further evaluation. Follows with Dr Justin Mend as an outpatient.  Had a previous MI in 2004, says that this pain feels different.   Review Of Systems: Per HPI, otherwise the remainder of the systems were negative.  Patient Active Problem List   Diagnosis Date Noted  . Chest pain 11/23/2015  . Abnormal ECG   . Pain in the chest   . Hypotension 10/30/2014  . Abnormal EKG 10/30/2014  . 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 End stage renal disease ([place]) 07/18/2012  . Other complications due to renal dialysis device, implant, and graft 07/18/2012    Past Medical History: Past Medical History  Diagnosis Date  . Hypertension   . Anemia   . CHF (congestive heart failure) ([place])   . IBS (irritable bowel syndrome)   .  Hyperlipidemia   . Myocardial infarction ([facility]) 2004  . Childhood asthma   . Type II diabetes mellitus ([place])   . Hypothyroidism   . History of blood transfusion 1996    "related to menses"  . GERD (gastroesophageal reflux disease)   . Headache     "a few times/week" (10/30/2014)  . Osteoarthritis   . Chronic upper back pain   . ESRD (end stage renal disease) on dialysis ([place])     "TTS; [facility]  in [place]" (10/30/2014)    Past Surgical History: Past Surgical History  Procedure Laterality Date  . Appendectomy    . Av fistula placement Left 12/03/2006    "forearm"  . Av fistula repair Left 11/25/2009    "forearm"  . Insertion of dialysis catheter Right 2010    "chest"  . Revison of arteriovenous fistula Left 12/04/2013    Procedure: REVISON OF ARTERIOVENOUS FISTULA;  Surgeon: Rosetta Posner, MD;  Location: [facility];  Service: Vascular;  Laterality: Left;  . Cholecystectomy open  1970's  . Hernia repair    . Umbilical hernia repair  1990's X 2  . Tubal ligation  1989  . Parathyroidectomy  2010?    Parathyroid autotransplantation     Social History: Social History  Substance Use Topics  . Smoking status: Former Smoker -- 3 years    Types: Cigarettes  . Smokeless tobacco: Never Used     Comment: "stopped smoking in the 1970's"  . Alcohol Use: No   Additional social history: None  Please also refer to relevant sections of EMR.  Family History: No significant family history reported.   Allergies and Medications: Allergies  Allergen Reactions  . Sulfa Antibiotics Anaphylaxis  . Amoxicillin Hives  . Chlorphen-Phenyleph-Asa Rash  . Ibuprofen Hives  . Naldecon Senior [Guaifenesin] Hives   No current facility-administered medications on file prior to encounter.   Current Outpatient Prescriptions on File Prior to Encounter  Medication Sig Dispense Refill  . acetaminophen (TYLENOL) 500 MG tablet Take 500 mg by mouth every 6 (six) hours as needed for mild pain.    Marland Kitchen aspirin EC 81 MG tablet Take 81 mg by mouth every morning.    Marland Kitchen BIOTIN PO Take 1 tablet by mouth daily. 10,048mcg    . calcium acetate (PHOSLO) 667 MG capsule Take 1,334-2,668 mg by mouth 3 (three) times daily with meals. Takes 4 capsules three times daily before each meal.    . carbidopa-levodopa (SINEMET) 25-100 MG per tablet Take 1 tablet by mouth 4 (four) times daily.     . carvedilol (COREG) 12.5 MG  tablet Take 25 mg by mouth at bedtime.     . diclofenac sodium (VOLTAREN) 1 % GEL Apply 4 g topically 4 (four) times daily as needed (pain).     Marland Kitchen docusate sodium (COLACE) 100 MG capsule Take 100 mg by mouth 2 (two) times daily.    . fenofibrate (TRICOR) 48 MG tablet Take 48 mg by mouth daily.    . finasteride (PROSCAR) 5 MG tablet Take 2.5 mg by mouth daily.    . isosorbide mononitrate (IMDUR) 30 MG 24 hr tablet Take 30 mg by mouth daily.    Marland Kitchen levothyroxine (SYNTHROID, LEVOTHROID) 50 MCG tablet Take 50 mcg by mouth daily before breakfast.     . loperamide (IMODIUM) 2 MG capsule Take 2 mg by mouth as needed for diarrhea or loose stools.    . meclizine (ANTIVERT) 25 MG tablet Take 12.5-25 mg by mouth 3 (three) times  daily as needed for dizziness.     . multivitamin (RENA-VIT) TABS tablet Take 1 tablet by mouth every morning.    . Omega-3 Fatty Acids (FISH OIL) 1000 MG CAPS Take 2 capsules by mouth daily.    Marland Kitchen omeprazole (PRILOSEC) 20 MG capsule Take 20 mg by mouth 2 (two) times daily before a meal.    . Zinc 50 MG CAPS Take 1 capsule by mouth every morning.     . dicyclomine (BENTYL) 10 MG capsule Take 10 mg by mouth 4 (four) times daily -  before meals and at bedtime.       Objective: BP 140/63 mmHg  Pulse 60  Temp(Src) 98.4 F (36.9 C) (Oral)  Resp 18  Ht 4' 11.75" (1.518 m)  Wt 222 lb 14.4 oz (101.107 kg)  BMI 43.88 kg/m2  SpO2 97% Exam: General: 65 year old female in NAD, sitting on edge of bed, eating a sandwich Eyes: EOMI, PERRL ENTM: MMM, OP clear Neck: No JVD noted, though limited due to body habitus. FROM Cardiovascular: RRR, no murmurs noted. Chest wall nontender to palpation. Respiratory: NWOB, CTAB Abdomen: Obese, S, NT, ND MSK: Trace nonpitting LE edema. Back nontender to palpation Skin: Warm, dry Neuro: Alert and oriented, no focal deficits Psych: Appropriate mood and affect.   Labs and Imaging: CBC BMET   Recent Labs Lab 11/23/15 0815  WBC 6.9  HGB 10.6*   HCT 32.5*  PLT 266    Recent Labs Lab 11/23/15 0815  NA 135  K 4.4  CL 97*  CO2 25  BUN 60*  CREATININE 9.42*  GLUCOSE 163*  CALCIUM 9.3       Recent Labs Lab 11/23/15 1108  TROPONINI 0.05*   BNP 124  EKG: NSR, no acute ischemic changes  Dg Chest 2 View  11/23/2015  CLINICAL DATA:  Patient with chest pain since Thursday after dialysis. EXAM: CHEST  2 VIEW COMPARISON:  Chest radiograph 06/21/2015. FINDINGS: Stable cardiac and mediastinal contours. Tortuosity of the thoracic aorta. Elevation right hemidiaphragm. No consolidative pulmonary opacities. No pleural effusion or pneumothorax. Old left lateral rib fracture. Thoracic spine degenerative changes. Upper abdominal surgical clips. IMPRESSION: No active cardiopulmonary disease. Electronically Signed   By: Lovey Newcomer M.D.   On: 11/23/2015 09:21   Vivi Barrack, MD 11/23/2015, 1:48 PM PGY-2, [facility] pager: [phone], text pages welcome

## 2015-11-23 NOTE — Progress Notes (Signed)
Report received via [facility] Dialysis RN, reviewed VS during procedure and that she just was a little crampy and was given Tylenol, 3.0 liters were removed and she tolerated well, will continue to monitor.

## 2015-11-23 NOTE — Plan of Care (Signed)
Problem: Safety: Goal: Ability to remain free from injury will improve Outcome: Progressing Instructed patient to use her call light or to use her phone and call the RN/NT directly if she needed anything and not to get OOB by herself and she verbalized understanding, will continue to monitor. She also asked about Visiting hours and that was also addressed.

## 2015-11-23 NOTE — ED Notes (Signed)
Patient complains of chest pain with radiation to back and nausea with same since yesterday. Was to have dialysis today but they sent her here prior to treatment.

## 2015-11-23 NOTE — ED Provider Notes (Signed)
CSN: [id number]     Arrival date & time 11/23/15  0758 History   First MD Initiated Contact with Patient 11/23/15 [phone]     Chief Complaint  Patient presents with  . Chest Pain     (Consider location/radiation/quality/duration/timing/severity/associated sxs/prior Treatment) HPI Patient presents with concern of chest pain and left upper abdominal pain. Symptoms began about 36 hours ago, since onset of been persistent with waxing/waning severity. Pain is sore, moderate. Symptoms began after a dialysis session. Since onset symptoms have not been made worse by anything, but seemed to be slightly better with Tylenol. Patient went to dialysis today, but was referred here given her concern for chest pain. No fever, no vomiting.  Patient does complain of lightheadedness, nausea. Patient has multiple other medical issues, including IBS, MI, CHF. Patient denies dyspnea.  Past Medical History  Diagnosis Date  . Hypertension   . Anemia   . CHF (congestive heart failure) ([place])   . IBS (irritable bowel syndrome)   . Hyperlipidemia   . Myocardial infarction ([place]) 2004  . Childhood asthma   . Type II diabetes mellitus ([place])   . Hypothyroidism   . History of blood transfusion 1996    "related to menses"  . GERD (gastroesophageal reflux disease)   . Headache     "a few times/week" (10/30/2014)  . Osteoarthritis   . Chronic upper back pain   . ESRD (end stage renal disease) on dialysis ([place])     "TTS; [facility] in [place]" (10/30/2014)   Past Surgical History  Procedure Laterality Date  . Appendectomy    . Av fistula placement Left 12/03/2006    "forearm"  . Av fistula repair Left 11/25/2009    "forearm"  . Insertion of dialysis catheter Right 2010    "chest"  . Revison of arteriovenous fistula Left 12/04/2013    Procedure: REVISON OF ARTERIOVENOUS FISTULA;  Surgeon: Rosetta Posner, MD;  Location: [facility];  Service: Vascular;  Laterality: Left;  . Cholecystectomy open  1970's  . Hernia  repair    . Umbilical hernia repair  1990's X 2  . Tubal ligation  1989  . Parathyroidectomy  2010?    Parathyroid autotransplantation    No family history on file. Social History  Substance Use Topics  . Smoking status: Former Smoker -- 3 years    Types: Cigarettes  . Smokeless tobacco: Never Used     Comment: "stopped smoking in the 1970's"  . Alcohol Use: No   OB History    No data available     Review of Systems  Constitutional:       Per HPI, otherwise negative  HENT:       Per HPI, otherwise negative  Respiratory:       Per HPI, otherwise negative  Cardiovascular:       Per HPI, otherwise negative  Gastrointestinal: Positive for nausea. Negative for vomiting.  Endocrine:       Negative aside from HPI  Genitourinary:       Neg aside from HPI   Musculoskeletal:       Per HPI, otherwise negative  Skin: Negative.   Neurological: Negative for syncope.      Allergies  Sulfa antibiotics; Amoxicillin; Chlorphen-phenyleph-asa; Ibuprofen; and Naldecon senior  Home Medications   Prior to Admission medications   Medication Sig Start Date End Date Taking? Authorizing Provider  acetaminophen (TYLENOL) 500 MG tablet Take 500 mg by mouth every 6 (six) hours as needed for mild pain.  Historical Provider, MD  aspirin EC 81 MG tablet Take 81 mg by mouth every morning.    Historical Provider, MD  BIOTIN PO Take 1 tablet by mouth daily. 10,03mcg    Historical Provider, MD  calcium acetate (PHOSLO) 667 MG capsule Take 1,334-2,668 mg by mouth 3 (three) times daily with meals. Takes 4 capsules three times daily before each meal.    Historical Provider, MD  carbidopa-levodopa (SINEMET) 25-100 MG per tablet Take 1 tablet by mouth 4 (four) times daily.  02/21/14 03/28/15  Historical Provider, MD  carvedilol (COREG) 12.5 MG tablet Take 25 mg by mouth at bedtime.     Historical Provider, MD  cyclobenzaprine (FLEXERIL) 5 MG tablet Take 1 tablet (5 mg total) by mouth at  bedtime. Patient not taking: Reported on 10/30/2014 06/03/14   Harvie Heck, PA-C  diclofenac sodium (VOLTAREN) 1 % GEL Apply 4 g topically 4 (four) times daily as needed (pain).     Historical Provider, MD  dicyclomine (BENTYL) 10 MG capsule Take 10 mg by mouth 4 (four) times daily -  before meals and at bedtime.     Historical Provider, MD  docusate sodium (COLACE) 100 MG capsule Take 100 mg by mouth 2 (two) times daily.    Historical Provider, MD  fenofibrate (TRICOR) 48 MG tablet Take 48 mg by mouth daily.    Historical Provider, MD  finasteride (PROSCAR) 5 MG tablet Take 2.5 mg by mouth daily.    Historical Provider, MD  isosorbide mononitrate (IMDUR) 30 MG 24 hr tablet Take 30 mg by mouth daily.    Historical Provider, MD  levothyroxine (SYNTHROID, LEVOTHROID) 50 MCG tablet Take 50 mcg by mouth daily before breakfast.     Historical Provider, MD  loperamide (IMODIUM) 2 MG capsule Take 2 mg by mouth as needed for diarrhea or loose stools.    Historical Provider, MD  meclizine (ANTIVERT) 25 MG tablet Take 12.5-25 mg by mouth 3 (three) times daily as needed for dizziness.     Historical Provider, MD  methocarbamol (ROBAXIN) 500 MG tablet Take 1 tablet (500 mg total) by mouth 2 (two) times daily. 03/28/15   Hannah Muthersbaugh, PA-C  multivitamin (RENA-VIT) TABS tablet Take 1 tablet by mouth every morning.    Historical Provider, MD  Omega-3 Fatty Acids (FISH OIL) 1000 MG CAPS Take 2 capsules by mouth daily.    Historical Provider, MD  omeprazole (PRILOSEC) 20 MG capsule Take 20 mg by mouth 2 (two) times daily before a meal.    Historical Provider, MD  oxyCODONE-acetaminophen (PERCOCET/ROXICET) 5-325 MG per tablet Take 1-2 tablets by mouth every 4 (four) hours as needed for moderate pain. Patient not taking: Reported on 10/30/2014 05/14/14   Noland Fordyce, PA-C  oxyCODONE-acetaminophen (PERCOCET/ROXICET) 5-325 MG per tablet Take 1 tablet by mouth every 6 (six) hours as needed for moderate pain or  severe pain. Patient not taking: Reported on 10/30/2014 06/03/14   Harvie Heck, PA-C  prochlorperazine (COMPAZINE) 5 MG tablet Take 5 mg by mouth every 8 (eight) hours as needed for nausea or vomiting.     Historical Provider, MD  traMADol (ULTRAM) 50 MG tablet Take 50 mg by mouth every 6 (six) hours as needed for moderate pain.     Historical Provider, MD  Zinc 50 MG CAPS Take 1 capsule by mouth every morning.     Historical Provider, MD   BP 112/64 mmHg  Pulse 69  Temp(Src) 97.6 F (36.4 C) (Oral)  Resp 13  Ht 4'  11" (1.499 m)  Wt 220 lb (99.791 kg)  BMI 44.41 kg/m2  SpO2 98% Physical Exam  Constitutional: She is oriented to person, place, and time. She appears well-developed and well-nourished. No distress.  HENT:  Head: Normocephalic and atraumatic.  Eyes: Conjunctivae and EOM are normal.  Cardiovascular: Normal rate and regular rhythm.   Pulmonary/Chest: Effort normal and breath sounds normal. No stridor. No respiratory distress.  Abdominal: She exhibits no distension.  Tender to palpation left upper quadrant, mild guarding, no rebound  Musculoskeletal: She exhibits no edema.  AV fistula palpable in left upper arm, palpable thrill  Neurological: She is alert and oriented to person, place, and time. No cranial nerve deficit.  Skin: Skin is warm and dry.  Psychiatric: She has a normal mood and affect.  Nursing note and vitals reviewed.   ED Course  Procedures (including critical care time) Labs Review Labs Reviewed  BASIC METABOLIC PANEL - Abnormal; Notable for the following:    Chloride 97 (*)    Glucose, Bld 163 (*)    BUN 60 (*)    Creatinine, Ser 9.42 (*)    GFR calc non Af Amer 4 (*)    GFR calc Af Amer 4 (*)    All other components within normal limits  CBC - Abnormal; Notable for the following:    RBC 3.08 (*)    Hemoglobin 10.6 (*)    HCT 32.5 (*)    MCV 105.5 (*)    MCH 34.4 (*)    All other components within normal limits  HEPATIC FUNCTION PANEL -  Abnormal; Notable for the following:    ALT <5 (*)    Bilirubin, Direct <0.1 (*)    All other components within normal limits  BRAIN NATRIURETIC PEPTIDE - Abnormal; Notable for the following:    B Natriuretic Peptide 124.1 (*)    All other components within normal limits  TROPONIN I - Abnormal; Notable for the following:    Troponin I 0.05 (*)    All other components within normal limits  LIPASE, BLOOD  I-STAT TROPOININ, ED    Imaging Review Dg Chest 2 View  11/23/2015  CLINICAL DATA:  Patient with chest pain since Thursday after dialysis. EXAM: CHEST  2 VIEW COMPARISON:  Chest radiograph 06/21/2015. FINDINGS: Stable cardiac and mediastinal contours. Tortuosity of the thoracic aorta. Elevation right hemidiaphragm. No consolidative pulmonary opacities. No pleural effusion or pneumothorax. Old left lateral rib fracture. Thoracic spine degenerative changes. Upper abdominal surgical clips. IMPRESSION: No active cardiopulmonary disease. Electronically Signed   By: Lovey Newcomer M.D.   On: 11/23/2015 09:21   I have personally reviewed and evaluated these images and lab results as part of my medical decision-making.  EKG with sinus rhythm, rate 79, nonspecific T-wave changes, abnormal Pulse oximetry 98% room air normal Cardiac monitor 80 sinus normal  Chart review notable for admission 3 weeks ago for similar chest pain. Results of that time were reassuring.  12:03 PM Patient's chest pain has resolved, but her second troponin is abnormal, elevated. Given the description of chest pain earlier today, elevated troponin, patient will be admitted. MDM  Elderly female with multiple medical issues including end-stage renal disease on dialysis presents with chest pain. Pain improves here however, the patient is found to have elevated troponin. Given the patient's chest pain earlier today, as well as episode 2 weeks ago, and new elevated troponin, there is some concern for unstable angina. With  resolution of pain, the patient was admitted to  her primary care team for further evaluation, management.  Carmin Muskrat, MD 11/23/15 1217

## 2015-11-24 ENCOUNTER — Other Ambulatory Visit: Payer: Self-pay

## 2015-11-24 DIAGNOSIS — N186 End stage renal disease: Secondary | ICD-10-CM | POA: Diagnosis not present

## 2015-11-24 DIAGNOSIS — I10 Essential (primary) hypertension: Secondary | ICD-10-CM | POA: Diagnosis not present

## 2015-11-24 DIAGNOSIS — R0789 Other chest pain: Secondary | ICD-10-CM | POA: Diagnosis not present

## 2015-11-24 DIAGNOSIS — R079 Chest pain, unspecified: Secondary | ICD-10-CM | POA: Diagnosis not present

## 2015-11-24 DIAGNOSIS — E118 Type 2 diabetes mellitus with unspecified complications: Secondary | ICD-10-CM | POA: Diagnosis not present

## 2015-11-24 LAB — GLUCOSE, CAPILLARY: Glucose-Capillary: 143 mg/dL — ABNORMAL HIGH (ref 65–99)

## 2015-11-24 LAB — MRSA PCR SCREENING: MRSA BY PCR: NEGATIVE

## 2015-11-24 LAB — LIPID PANEL
CHOLESTEROL: 303 mg/dL — AB (ref 0–200)
HDL: 36 mg/dL — AB (ref >40)
LDL Cholesterol: UNDETERMINED mg/dL (ref 0–99)
Total CHOL/HDL Ratio: 8.4 RATIO
Triglycerides: 758 mg/dL — ABNORMAL HIGH (ref <150)
VLDL: UNDETERMINED mg/dL (ref 0–40)

## 2015-11-24 LAB — TSH: TSH: 1.69 u[IU]/mL (ref 0.350–4.500)

## 2015-11-24 MED ORDER — FENOFIBRATE 120 MG PO TABS: 120.0000 mg | ORAL_TABLET | Freq: Every day | ORAL | Status: DC

## 2015-11-24 MED ORDER — FENOFIBRATE 160 MG PO TABS
160.0000 mg | ORAL_TABLET | Freq: Every day | ORAL | Status: DC
  Administered 2015-11-24: 160 mg via ORAL
  Filled 2015-11-24: qty 1

## 2015-11-24 NOTE — Discharge Summary (Signed)
[facility] Discharge Summary  Patient name: Roberta Bryant Medical record number: [id number] Date of birth: [birth year] Age: 65 y.o. Gender: female Date of Admission: 11/23/2015  Date of Discharge: 11/24/2015 Admitting Physician: Kinnie Feil, MD  Primary Care Provider: Angelina Sheriff., MD Consultants: Nephrology  Indication for Hospitalization: Chest Pain  Discharge Diagnoses/Problem List:  Chest pain, hypertriglyceridemia, ESRD on HD, HTN, T2DM, HFpEF, hypothyroidism, parkinson's disease  Disposition: Home  Discharge Condition: Improved  Discharge Exam:  Blood pressure 146/60, pulse 73, temperature 98.4 F (36.9 C), temperature source Oral, resp. rate 18, height 4' 11.75" (1.518 m), weight 222 lb 14.2 oz (101.1 kg), SpO2 96 %. General: 65 year old female in NAD, sitting on edge on bed.  Cardiovascular: RRR, no murmurs Respiratory: NWOB, CTAB Abdomen: Obese, S, NT, ND Extremities: Trace LE edema, no cyanosis   Brief Hospital Course:  Patient is a 65 year old female who presented to the ED with 2 days of atypical substernal chest pain. She was admitted for observation and ACS rule out. Her work up included stable serial troponin (0.05 > 0.04 >0.03), stable EKG, and negative CXR. Her chest pain resolved by hospital day 1 and patient was discharged home in stable condition.  Nephrology was consulted and the patient had hemodialysis on the day of admission.  Issues for Follow Up:  1. Chest Pain - Would likely benefit from further outpatient evaluation for cardiac causes of chest pain given her significant risk factors and history of MI 2. Hypertriglyceridemia - Triglycerides severely elevated to 758 during this admission. We increased her fenofibrate dose at discharge. Consider starting statin therapy as outpatient.  Significant Procedures: None  Significant Labs and Imaging:   Recent Labs Lab 11/23/15 0815  WBC 6.9  HGB 10.6*  HCT  32.5*  PLT 266    Recent Labs Lab 11/23/15 0815 11/23/15 0849  NA 135  --   K 4.4  --   CL 97*  --   CO2 25  --   GLUCOSE 163*  --   BUN 60*  --   CREATININE 9.42*  --   CALCIUM 9.3  --   ALKPHOS  --  65  AST  --  19  ALT  --  <5*  ALBUMIN  --  3.6   TSH 1.69  Lipid Panel     Component Value Date/Time   CHOL 303* 11/24/2015 0311   TRIG 758* 11/24/2015 0311   HDL 36* 11/24/2015 0311   CHOLHDL 8.4 11/24/2015 0311   VLDL UNABLE TO CALCULATE IF TRIGLYCERIDE OVER 400 mg/dL 11/24/2015 0311   LDLCALC UNABLE TO CALCULATE IF TRIGLYCERIDE OVER 400 mg/dL 11/24/2015 0311    Recent Labs Lab 11/23/15 1108 11/23/15 1431 11/23/15 2006  TROPONINI 0.05* 0.04* 0.03   EKG: NSR, no acute ischemic changes.  Dg Chest 2 View  11/23/2015  CLINICAL DATA:  Patient with chest pain since Thursday after dialysis. EXAM: CHEST  2 VIEW COMPARISON:  Chest radiograph 06/21/2015. FINDINGS: Stable cardiac and mediastinal contours. Tortuosity of the thoracic aorta. Elevation right hemidiaphragm. No consolidative pulmonary opacities. No pleural effusion or pneumothorax. Old left lateral rib fracture. Thoracic spine degenerative changes. Upper abdominal surgical clips. IMPRESSION: No active cardiopulmonary disease. Electronically Signed   By: Lovey Newcomer M.D.   On: 11/23/2015 09:21   Results/Tests Pending at Time of Discharge: A1c  Discharge Medications:    Medication List    TAKE these medications        acetaminophen 500  MG tablet  Commonly known as:  TYLENOL  Take 500 mg by mouth every 6 (six) hours as needed for mild pain.     aspirin EC 81 MG tablet  Take 81 mg by mouth every morning.     BIOTIN PO  Take 1 tablet by mouth daily. 10,041mcg     calcium acetate 667 MG capsule  Commonly known as:  PHOSLO  Take 1,334-2,668 mg by mouth 3 (three) times daily with meals. Takes 4 capsules three times daily before each meal.     carvedilol 12.5 MG tablet  Commonly known as:  COREG  Take 25  mg by mouth at bedtime.     diclofenac sodium 1 % Gel  Commonly known as:  VOLTAREN  Apply 4 g topically 4 (four) times daily as needed (pain).     dicyclomine 10 MG capsule  Commonly known as:  BENTYL  Take 10 mg by mouth 4 (four) times daily -  before meals and at bedtime.     docusate sodium 100 MG capsule  Commonly known as:  COLACE  Take 100 mg by mouth 2 (two) times daily.     Fenofibrate 120 MG Tabs  Take 1 tablet (120 mg total) by mouth daily.     finasteride 5 MG tablet  Commonly known as:  PROSCAR  Take 2.5 mg by mouth daily.     Fish Oil 1000 MG Caps  Take 2 capsules by mouth daily.     isosorbide mononitrate 30 MG 24 hr tablet  Commonly known as:  IMDUR  Take 30 mg by mouth daily.     levothyroxine 50 MCG tablet  Commonly known as:  SYNTHROID, LEVOTHROID  Take 50 mcg by mouth daily before breakfast.     loperamide 2 MG capsule  Commonly known as:  IMODIUM  Take 2 mg by mouth as needed for diarrhea or loose stools.     meclizine 25 MG tablet  Commonly known as:  ANTIVERT  Take 12.5-25 mg by mouth 3 (three) times daily as needed for dizziness.     multivitamin Tabs tablet  Take 1 tablet by mouth every morning.     omeprazole 20 MG capsule  Commonly known as:  PRILOSEC  Take 20 mg by mouth 2 (two) times daily before a meal.     SINEMET 25-100 MG tablet  Generic drug:  carbidopa-levodopa  Take 1 tablet by mouth 4 (four) times daily.     sitaGLIPtin 25 MG tablet  Commonly known as:  JANUVIA  Take 25 mg by mouth daily.     Zinc 50 MG Caps  Take 1 capsule by mouth every morning.        Discharge Instructions: Please refer to Patient Instructions section of EMR for full details.  Patient was counseled important signs and symptoms that should prompt return to medical care, changes in medications, dietary instructions, activity restrictions, and follow up appointments.   Follow-Up Appointments: Follow-up Information    Schedule an appointment as soon  as possible for a visit with [facility] Angelique Blonder., MD.   Specialty:  Family Medicine   Contact information:   [address] [place] [address] [phone]       Vivi Barrack, MD 11/24/2015, 8:20 AM PGY-2, [place]

## 2015-11-24 NOTE — Progress Notes (Signed)
Family Medicine Teaching Service Daily Progress Note Intern Pager: [phone]  Patient name: Roberta Bryant Medical record number: [id number] Date of birth: Jan 02, 1951 Age: 65 y.o. Gender: female  Primary Care Provider: Angelina Sheriff., MD Consultants: Nephrology Code Status: DNR  Pt Overview and Major Events to Date:  11/24/2015 - Patient admitted with atypical chest pain  Assessment and Plan: Roberta Bryant is a 65 y.o. female presenting with atypical chest pain. PMH is significant for prior MI in 2004, ESRD on HD, HTN, T2DM, HFpEF  Chest Pain. Atypical chest pain, though concern for cardiac etiology given her history and risk factors. HEART score 5 on admission. Reports prior history of MI in 2004. Cardiologist is Dr Nancee Liter in [place]. CXR and EKG negative. Troponin elevated to 0.05. Not currently having chest pain. Wells score 0. Chest pain resolved.  - Admit for ACS rule out / chest pain observation under attending Dr Gwendlyn Deutscher. - Troponin 0.5 > 0.4 > 0.3 - Lipid panel with significant hypertriglyceridemia.  - Hemoglobin A1cpending.   ESRD on HD. HD TTS. Sees Dr Justin Mend outpatient. Dry weight ~97kg. Does not appear overloaded.  - Nephrology consulted for HD - Continue phoslo  HTN / HLD / ?CAD. History of MI in 2004. Significant hypertriglyceridemia last year (TG 695) - Continue ASA 81mg , coreg 25mg  qHS, imdur 30mg  daily,  - Increase home fenofibrate to 160mg  daily  HFpEF. History of Grade 1 diastolic dysfunction with preserved EF. No echo on record her. Does not appear volume overloaded on exam. - Fluid management per nephrology with HD - Daily weights  T2DM. Hemoglobin A1c 6.3 last year. ON januvia at home - Hold home Pan[place] - SSI  Hypothyroidism. TSH 1.6. On levothyroxine 22mcg daily. - Continue home levothyroxine  Parkinson's Disease.  - Continue home sinemet  FEN/GI: Renal diet, home PPI, SLIV Prophylaxis: SubQ heparin  Disposition: Anticipate  discharge home later today.   Subjective:  Chest pain resolved this morning. No other complaints. Ready to go home later day.   Objective: Temp:  [97.6 F (36.4 C)-98.4 F (36.9 C)] 98.4 F (36.9 C) (06/18 0400) Pulse Rate:  [60-78] 73 (06/18 0400) Resp:  [13-23] 18 (06/18 0400) BP: (107-154)/(50-83) 146/60 mmHg (06/18 0400) SpO2:  [94 %-100 %] 96 % (06/18 0400) Weight:  [217 lb 2.5 oz (98.5 kg)-224 lb 6.9 oz (101.8 kg)] 222 lb 14.2 oz (101.1 kg) (06/18 0400) Physical Exam: General: 65 year old female in NAD, sitting on edge on bed.  Cardiovascular: RRR, no murmurs Respiratory: NWOB, CTAB Abdomen: Obese, S, NT, ND Extremities: Trace LE edema, no cyanosis   Laboratory:  Recent Labs Lab 11/23/15 0815  WBC 6.9  HGB 10.6*  HCT 32.5*  PLT 266    Recent Labs Lab 11/23/15 0815 11/23/15 0849  NA 135  --   K 4.4  --   CL 97*  --   CO2 25  --   BUN 60*  --   CREATININE 9.42*  --   CALCIUM 9.3  --   PROT  --  7.0  BILITOT  --  0.4  ALKPHOS  --  65  ALT  --  <5*  AST  --  19  GLUCOSE 163*  --      Recent Labs Lab 11/23/15 1108 11/23/15 1431 11/23/15 2006  TROPONINI 0.05* 0.04* 0.03   Imaging/Diagnostic Tests: EKG: NSR, no acute ischemic changes  Vivi Barrack, MD 11/24/2015, 6:52 AM PGY-2, [place] Intern pager: [phone], text pages  welcome

## 2015-11-24 NOTE — Discharge Instructions (Signed)
We increased your triglyceride medication. Please talk to your regular doctor about if you should continue at this dose. You should follow up with your regular doctor about your chest pain. Your EKG, chest xray, and heart enzymes were all stable here.   Chest Pain Observation It is often hard to give a specific diagnosis for the cause of chest pain. Among other possibilities your symptoms might be caused by inadequate oxygen delivery to your heart (angina). Angina that is not treated or evaluated can lead to a heart attack (myocardial infarction) or death. Blood tests, electrocardiograms, and X-rays may have been done to help determine a possible cause of your chest pain. After evaluation and observation, your health care provider has determined that it is unlikely your pain was caused by an unstable condition that requires hospitalization. However, a full evaluation of your pain may need to be completed, with additional diagnostic testing as directed. It is very important to keep your follow-up appointments. Not keeping your follow-up appointments could result in permanent heart damage, disability, or death. If there is any problem keeping your follow-up appointments, you must call your health care provider. HOME CARE INSTRUCTIONS  Due to the slight chance that your pain could be angina, it is important to follow your health care provider's treatment plan and also maintain a healthy lifestyle:  Maintain or work toward achieving a healthy weight.  Stay physically active and exercise regularly.  Decrease your salt intake.  Eat a balanced, healthy diet. Talk to a dietitian to learn about heart-healthy foods.  Increase your fiber intake by including whole grains, vegetables, fruits, and nuts in your diet.  Avoid situations that cause stress, anger, or depression.  Take medicines as advised by your health care provider. Report any side effects to your health care provider. Do not stop medicines or  adjust the dosages on your own.  Quit smoking. Do not use nicotine patches or gum until you check with your health care provider.  Keep your blood pressure, blood sugar, and cholesterol levels within normal limits.  Limit alcohol intake to no more than 1 drink per day for women who are not pregnant and 2 drinks per day for men.  Do not abuse drugs. SEEK IMMEDIATE MEDICAL CARE IF: You have severe chest pain or pressure which may include symptoms such as:  You feel pain or pressure in your arms, neck, jaw, or back.  You have severe back or abdominal pain, feel sick to your stomach (nauseous), or throw up (vomit).  You are sweating profusely.  You are having a fast or irregular heartbeat.  You feel short of breath while at rest.  You notice increasing shortness of breath during rest, sleep, or with activity.  You have chest pain that does not get better after rest or after taking your usual medicine.  You wake from sleep with chest pain.  You are unable to sleep because you cannot breathe.  You develop a frequent cough or you are coughing up blood.  You feel dizzy, faint, or experience extreme fatigue.  You develop severe weakness, dizziness, fainting, or chills. Any of these symptoms may represent a serious problem that is an emergency. Do not wait to see if the symptoms will go away. Call your local emergency services (911 in the [place] Do not drive yourself to the hospital. MAKE SURE YOU:  Understand these instructions.  Will watch your condition.  Will get help right away if you are not doing well or get worse.  This information is not intended to replace advice given to you by your health care provider. Make sure you discuss any questions you have with your health care provider.   Document Released: 06/27/2010 Document Revised: 05/30/2013 Document Reviewed: 11/24/2012 Elsevier Interactive Patient Education [employer].

## 2015-11-25 LAB — HEMOGLOBIN A1C
HEMOGLOBIN A1C: 6.4 % — AB (ref 4.8–5.6)
Mean Plasma Glucose: 137 mg/dL

## 2016-01-01 DIAGNOSIS — R9439 Abnormal result of other cardiovascular function study: Secondary | ICD-10-CM | POA: Insufficient documentation

## 2016-02-27 ENCOUNTER — Emergency Department (HOSPITAL_COMMUNITY)
Admission: EM | Admit: 2016-02-27 | Discharge: 2016-02-27 | Disposition: A | Discharge status: Home / Self Care | Payer: Medicare Other | Attending: Emergency Medicine | Admitting: Emergency Medicine

## 2016-02-27 ENCOUNTER — Encounter (HOSPITAL_COMMUNITY): Payer: Self-pay | Admitting: *Deleted

## 2016-02-27 ENCOUNTER — Emergency Department (HOSPITAL_COMMUNITY): Payer: Medicare Other

## 2016-02-27 DIAGNOSIS — Z7982 Long term (current) use of aspirin: Secondary | ICD-10-CM | POA: Diagnosis not present

## 2016-02-27 DIAGNOSIS — R0789 Other chest pain: Secondary | ICD-10-CM | POA: Diagnosis not present

## 2016-02-27 DIAGNOSIS — I509 Heart failure, unspecified: Secondary | ICD-10-CM | POA: Diagnosis not present

## 2016-02-27 DIAGNOSIS — Z7901 Long term (current) use of anticoagulants: Secondary | ICD-10-CM | POA: Diagnosis not present

## 2016-02-27 DIAGNOSIS — Z992 Dependence on renal dialysis: Secondary | ICD-10-CM | POA: Diagnosis not present

## 2016-02-27 DIAGNOSIS — E1122 Type 2 diabetes mellitus with diabetic chronic kidney disease: Secondary | ICD-10-CM | POA: Diagnosis not present

## 2016-02-27 DIAGNOSIS — I132 Hypertensive heart and chronic kidney disease with heart failure and with stage 5 chronic kidney disease, or end stage renal disease: Secondary | ICD-10-CM | POA: Diagnosis not present

## 2016-02-27 DIAGNOSIS — R079 Chest pain, unspecified: Secondary | ICD-10-CM | POA: Diagnosis present

## 2016-02-27 DIAGNOSIS — Z7984 Long term (current) use of oral hypoglycemic drugs: Secondary | ICD-10-CM | POA: Insufficient documentation

## 2016-02-27 DIAGNOSIS — N186 End stage renal disease: Secondary | ICD-10-CM | POA: Diagnosis not present

## 2016-02-27 DIAGNOSIS — I252 Old myocardial infarction: Secondary | ICD-10-CM | POA: Insufficient documentation

## 2016-02-27 DIAGNOSIS — Z87891 Personal history of nicotine dependence: Secondary | ICD-10-CM | POA: Insufficient documentation

## 2016-02-27 DIAGNOSIS — E039 Hypothyroidism, unspecified: Secondary | ICD-10-CM | POA: Insufficient documentation

## 2016-02-27 LAB — I-STAT TROPONIN, ED
TROPONIN I, POC: 0.01 ng/mL (ref 0.00–0.08)
Troponin i, poc: 0 ng/mL (ref 0.00–0.08)

## 2016-02-27 LAB — BASIC METABOLIC PANEL
Anion gap: 13 (ref 5–15)
BUN: 58 mg/dL — ABNORMAL HIGH (ref 6–20)
CHLORIDE: 99 mmol/L — AB (ref 101–111)
CO2: 24 mmol/L (ref 22–32)
Calcium: 9.5 mg/dL (ref 8.9–10.3)
Creatinine, Ser: 10.06 mg/dL — ABNORMAL HIGH (ref 0.44–1.00)
GFR calc non Af Amer: 4 mL/min — ABNORMAL LOW (ref >60)
GFR, EST AFRICAN AMERICAN: 4 mL/min — AB (ref >60)
Glucose, Bld: 159 mg/dL — ABNORMAL HIGH (ref 65–99)
POTASSIUM: 5.1 mmol/L (ref 3.5–5.1)
SODIUM: 136 mmol/L (ref 135–145)

## 2016-02-27 LAB — CBC
HEMATOCRIT: 32.6 % — AB (ref 36.0–46.0)
Hemoglobin: 10.5 g/dL — ABNORMAL LOW (ref 12.0–15.0)
MCH: 35.5 pg — ABNORMAL HIGH (ref 26.0–34.0)
MCHC: 32.2 g/dL (ref 30.0–36.0)
MCV: 110.1 fL — AB (ref 78.0–100.0)
PLATELETS: 235 10*3/uL (ref 150–400)
RBC: 2.96 MIL/uL — AB (ref 3.87–5.11)
RDW: 14.2 % (ref 11.5–15.5)
WBC: 6.3 10*3/uL (ref 4.0–10.5)

## 2016-02-27 MED ORDER — METHOCARBAMOL 500 MG PO TABS: 500.0000 mg | ORAL_TABLET | Freq: Two times a day (BID) | ORAL | 0 refills | Status: DC

## 2016-02-27 MED ORDER — HYDROCODONE-ACETAMINOPHEN 5-325 MG PO TABS: 1.0000 | ORAL_TABLET | ORAL | 0 refills | Status: DC | PRN

## 2016-02-27 NOTE — Discharge Instructions (Signed)
Return to the ED with any concerns including difficulty breathing, nausea with chest pain, breaking into a sweat with pain, leg swelling, fainting, decreased level of alertness/lethargy, or any other alarming symptoms

## 2016-02-27 NOTE — ED Provider Notes (Signed)
[facility] DEPT Provider Note   CSN: [id number] Arrival date & time: 02/27/16  1155     History   Chief Complaint Chief Complaint  Patient presents with  . Chest Pain    HPI Roberta Bryant is a 65 y.o. female.  HPI  Pt with hx of ESRD on dialysis, CHD, CAD with stents presenting with c/o left sided chest pain.  Pain is only present when moving her arm or moving her body to sit up or lie down.  Also present with palpation of the area.  Symptoms started yesterday and have been constant.  She states she also has pain that wraps around to her back- she has chronic pain in her back and this feels the same.  No shortness of breath.  No leg swelling.  No fever/chlls.  No cough.  She missed dialysis today but no other times.  She has not tried anything for her symptoms.  No nausea, diaphoresis associated with pain.    Past Medical History:  Diagnosis Date  . Anemia   . CHF (congestive heart failure) ([place])   . Childhood asthma   . Chronic upper back pain   . ESRD (end stage renal disease) on dialysis ([place])    "TTS; [facility] in [place]" (10/30/2014)  . GERD (gastroesophageal reflux disease)   . Headache    "a few times/week" (10/30/2014)  . History of blood transfusion 1996   "related to menses"  . Hyperlipidemia   . Hypertension   . Hypothyroidism   . IBS (irritable bowel syndrome)   . Myocardial infarction ([place]) 2004  . Osteoarthritis   . Type II diabetes mellitus [facility])     Patient Active Problem List   Diagnosis Date Noted  . Chest pain 11/23/2015  . ESRD on dialysis ([place])   . Type 2 diabetes mellitus with complication ([place])   . Essential hypertension   . Abnormal ECG   . Pain in the chest   . Hypotension 10/30/2014  . Abnormal EKG 10/30/2014  . 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 End stage renal disease ([place]) 07/18/2012  . Other complications due to renal dialysis device, implant, and graft 07/18/2012    Past Surgical History:    Procedure Laterality Date  . APPENDECTOMY    . AV FISTULA PLACEMENT Left 12/03/2006   "forearm"  . AV FISTULA REPAIR Left 11/25/2009   "forearm"  . CHOLECYSTECTOMY OPEN  1970's  . HERNIA REPAIR    . INSERTION OF DIALYSIS CATHETER Right 2010   "chest"  . PARATHYROIDECTOMY  2010?   Parathyroid autotransplantation   . REVISON OF ARTERIOVENOUS FISTULA Left 12/04/2013   Procedure: REVISON OF ARTERIOVENOUS FISTULA;  Surgeon: Rosetta Posner, MD;  Location: [facility];  Service: Vascular;  Laterality: Left;  . TUBAL LIGATION  1989  . UMBILICAL HERNIA REPAIR  1990's X 2    OB History    No data available       Home Medications    Prior to Admission medications   Medication Sig Start Date End Date Taking? Authorizing Provider  acetaminophen (TYLENOL) 325 MG tablet Take 650 mg by mouth every 6 (six) hours as needed.   Yes Historical Provider, MD  aspirin EC 81 MG tablet Take 81 mg by mouth every morning.   Yes Historical Provider, MD  calcium acetate (PHOSLO) 667 MG capsule Take 1,334-2,668 mg by mouth 3 (three) times daily with meals. Takes 4 capsules three times daily before each meal and taks 2 caps  with snacks when eating   Yes Historical Provider, MD  carbidopa-levodopa (SINEMET) 25-100 MG per tablet Take 1 tablet by mouth 4 (four) times daily.  02/21/14 02/27/16 Yes Historical Provider, MD  carvedilol (COREG) 12.5 MG tablet Take 25 mg by mouth at bedtime.    Yes Historical Provider, MD  ciprofloxacin (CIPRO) 500 MG tablet Take 500 mg by mouth daily with breakfast.   Yes Historical Provider, MD  diclofenac sodium (VOLTAREN) 1 % GEL Apply 4 g topically 4 (four) times daily as needed (pain).    Yes Historical Provider, MD  docusate sodium (COLACE) 100 MG capsule Take 100 mg by mouth 2 (two) times daily.   Yes Historical Provider, MD  fenofibrate 120 MG TABS Take 1 tablet (120 mg total) by mouth daily. 11/24/15  Yes Vivi Barrack, MD  finasteride (PROSCAR) 5 MG tablet Take 2.5 mg by mouth daily.    Yes Historical Provider, MD  HYDROcodone-acetaminophen (NORCO/VICODIN) 5-325 MG tablet Take 1 tablet by mouth every 6 (six) hours as needed for moderate pain.   Yes Historical Provider, MD  levothyroxine (SYNTHROID, LEVOTHROID) 50 MCG tablet Take 50 mcg by mouth daily before breakfast.    Yes Historical Provider, MD  loperamide (IMODIUM A-D) 2 MG tablet Take 2 mg by mouth 4 (four) times daily as needed for diarrhea or loose stools.   Yes Historical Provider, MD  meclizine (ANTIVERT) 25 MG tablet Take 12.5-25 mg by mouth 3 (three) times daily as needed for dizziness.    Yes Historical Provider, MD  multivitamin (RENA-VIT) TABS tablet Take 1 tablet by mouth daily.    Yes Historical Provider, MD  omeprazole (PRILOSEC) 20 MG capsule Take 20 mg by mouth 2 (two) times daily before a meal.   Yes Historical Provider, MD  prochlorperazine (COMPAZINE) 5 MG tablet Take 5 mg by mouth every 6 (six) hours as needed for nausea or vomiting (for dialysis).   Yes Historical Provider, MD  sitaGLIPtin (JANUVIA) 25 MG tablet Take 25 mg by mouth daily.   Yes Historical Provider, MD  ticagrelor (BRILINTA) 90 MG TABS tablet Take 90 mg by mouth 2 (two) times daily.   Yes Historical Provider, MD  tizanidine (ZANAFLEX) 2 MG capsule Take 2 mg by mouth 3 (three) times daily as needed for muscle spasms.   Yes Historical Provider, MD  Vitamin D, Ergocalciferol, (DRISDOL) 50000 units CAPS capsule Take 50,000 Units by mouth every Sunday.   Yes Historical Provider, MD  Zinc 50 MG CAPS Take 1 capsule by mouth every morning.    Yes Historical Provider, MD  HYDROcodone-acetaminophen (NORCO/VICODIN) 5-325 MG tablet Take 1 tablet by mouth every 4 (four) hours as needed. 02/27/16   Alfonzo Beers, MD  methocarbamol (ROBAXIN) 500 MG tablet Take 1 tablet (500 mg total) by mouth 2 (two) times daily. 02/27/16   Alfonzo Beers, MD    Family History History reviewed. No pertinent family history.  Social History Social History  Substance Use  Topics  . Smoking status: Former Smoker    Years: 3.00    Types: Cigarettes  . Smokeless tobacco: Never Used     Comment: "stopped smoking in the 1970's"  . Alcohol use No     Allergies   Crestor [rosuvastatin calcium]; Sulfa antibiotics; Amoxicillin; Chlorphen-phenyleph-asa; Ibuprofen; and [place] senior [guaifenesin]   Review of Systems Review of Systems  ROS reviewed and all otherwise negative except for mentioned in HPI   Physical Exam Updated Vital Signs BP 133/63   Pulse 67  Temp 97.9 F (36.6 C) (Oral)   Resp 13   SpO2 100%  Vitals reviewed Physical Exam Physical Examination: General appearance - alert, well appearing, and in no distress Mental status - alert, oriented to person, place, and time Eyes - no conjunctival injection, no scleral icterus Mouth - mucous membranes moist, pharynx normal without lesions Chest - clear to auscultation, no wheezes, rales or rhonchi, symmetric air entry, ttp over left anterior chest and pain with movement of left arm Heart - normal rate, regular rhythm, normal S1, S2, no murmurs, rubs, clicks or gallops Abdomen - soft, nontender, nondistended, no masses or organomegaly Neurological - alert, oriented, normal speech Extremities - peripheral pulses normal, no pedal edema, no clubbing or cyanosis Skin - normal coloration and turgor, no rashes  ED Treatments / Results  Labs (all labs ordered are listed, but only abnormal results are displayed) Labs Reviewed  BASIC METABOLIC PANEL - Abnormal; Notable for the following:       Result Value   Chloride 99 (*)    Glucose, Bld 159 (*)    BUN 58 (*)    Creatinine, Ser 10.06 (*)    GFR calc non Af Amer 4 (*)    GFR calc Af Amer 4 (*)    All other components within normal limits  CBC - Abnormal; Notable for the following:    RBC 2.96 (*)    Hemoglobin 10.5 (*)    HCT 32.6 (*)    MCV 110.1 (*)    MCH 35.5 (*)    All other components within normal limits  I-STAT TROPOININ, ED    I-STAT TROPOININ, ED    EKG  EKG Interpretation  Date/Time:  Thursday February 27 2016 12:22:28 EDT Ventricular Rate:  68 PR Interval:  176 QRS Duration: 92 QT Interval:  426 QTC Calculation: 452 R Axis:   57 Text Interpretation:  Normal sinus rhythm Normal ECG No significant change since last tracing Confirmed by Canary Brim  MD, Breeonna Mone [id number]) on 02/27/2016 6:34:00 PM       Radiology Dg Chest 2 View  Result Date: 02/27/2016 CLINICAL DATA:  Left chest pain for 1 week, began getting worse yesterday - also having SOB at times, pain running down right arm from shoulder area as well - pt had heart cath and stent done August 2nd - diabetic, nonsmoker EXAM: CHEST  2 VIEW COMPARISON:  01/02/2016 FINDINGS: The heart size and mediastinal contours are within normal limits. Both lungs are clear. Remote left rib fracture. Surgical clips are noted in the right upper quadrant the abdomen. IMPRESSION: No evidence for acute cardiopulmonary abnormality. Electronically Signed   By: Nolon Nations M.D.   On: 02/27/2016 13:02    Procedures Procedures (including critical care time)  Medications Ordered in ED Medications - No data to display   Initial Impression / Assessment and Plan / ED Course  I have reviewed the triage vital signs and the nursing notes.  Pertinent labs & imaging results that were available during my care of the patient were reviewed by me and considered in my medical decision making (see chart for details).  Clinical Course  pt has a heart score of 3.    Pt presenting with c/o chest pain.  Pain is definitely reproducible with palpation of the area of concern.  Doubt ACS in this clinical setting. Pt with reassuring lab data.  Treated with muscle relaxer and pain meds.  Discharged with strict return precautions.  Pt agreeable with plan.  Final Clinical  Impressions(s) / ED Diagnoses   Final diagnoses:  Chest wall pain    New Prescriptions Discharge Medication List as of  02/27/2016  6:37 PM    START taking these medications   Details  !! HYDROcodone-acetaminophen (NORCO/VICODIN) 5-325 MG tablet Take 1 tablet by mouth every 4 (four) hours as needed., Starting Thu 02/27/2016, Print    methocarbamol (ROBAXIN) 500 MG tablet Take 1 tablet (500 mg total) by mouth 2 (two) times daily., Starting Thu 02/27/2016, Print     !! - Potential duplicate medications found. Please discuss with provider.       Alfonzo Beers, MD 02/27/16 2259

## 2016-02-27 NOTE — ED Triage Notes (Addendum)
Pt reports left side chest pains since yesterday. Pain radiates into her back. Denies sob. Denies increase in pain with movement or breathing. ekg done at triage. No acute distress noted at triage. Dialysis pt, last treatment was Tuesday.

## 2016-04-15 ENCOUNTER — Ambulatory Visit (INDEPENDENT_AMBULATORY_CARE_PROVIDER_SITE_OTHER): Payer: Medicare Other | Admitting: Orthopaedic Surgery

## 2016-05-11 ENCOUNTER — Encounter (INDEPENDENT_AMBULATORY_CARE_PROVIDER_SITE_OTHER): Payer: Self-pay

## 2016-05-11 ENCOUNTER — Ambulatory Visit (INDEPENDENT_AMBULATORY_CARE_PROVIDER_SITE_OTHER): Payer: Medicare Other | Admitting: Orthopaedic Surgery

## 2016-05-11 DIAGNOSIS — M25511 Pain in right shoulder: Secondary | ICD-10-CM | POA: Diagnosis not present

## 2016-05-11 DIAGNOSIS — G8929 Other chronic pain: Secondary | ICD-10-CM

## 2016-05-11 NOTE — Progress Notes (Signed)
The patient continues to have significant pain and problems of the right shoulder. She's having problems with activities daily living and overhead activities in general. We placed a steroid injection in subacromial space and her shoulder on 03/04/2016 this did help for a little bit better shoulder function is continue to worsen her pain scale worsen. She's also tried rest ice and heat. She can't take anti-inflammatories. She's been to physical therapy as well.  Examination of her shoulder on the right side she is in a lot of pain with mobility of the shoulder. Her rotator cuff feels deficient to me and torn at this point based on her weakness with abduction as well as external rotation. She is employing a lot of her deltoids to lift her shoulder as well.   At this point I do feel that there is rotator cuff pathology were dealing with for the shoulder. An MRI is deathly warranted so we can better assess the pathology of her shoulder and, with a treatment plan for her. We'll see her back after the MRI.

## 2016-05-12 ENCOUNTER — Other Ambulatory Visit (INDEPENDENT_AMBULATORY_CARE_PROVIDER_SITE_OTHER): Payer: Self-pay | Admitting: Orthopaedic Surgery

## 2016-05-12 DIAGNOSIS — M25511 Pain in right shoulder: Principal | ICD-10-CM

## 2016-05-12 DIAGNOSIS — G8929 Other chronic pain: Secondary | ICD-10-CM

## 2016-05-23 ENCOUNTER — Encounter (HOSPITAL_COMMUNITY): Payer: Self-pay | Admitting: Emergency Medicine

## 2016-05-23 ENCOUNTER — Emergency Department (HOSPITAL_COMMUNITY)
Admission: EM | Admit: 2016-05-23 | Discharge: 2016-05-23 | Disposition: A | Discharge status: Home / Self Care | Payer: Medicare Other | Attending: Emergency Medicine | Admitting: Emergency Medicine

## 2016-05-23 ENCOUNTER — Emergency Department (HOSPITAL_COMMUNITY): Payer: Medicare Other

## 2016-05-23 DIAGNOSIS — I252 Old myocardial infarction: Secondary | ICD-10-CM | POA: Insufficient documentation

## 2016-05-23 DIAGNOSIS — Z7984 Long term (current) use of oral hypoglycemic drugs: Secondary | ICD-10-CM | POA: Insufficient documentation

## 2016-05-23 DIAGNOSIS — I509 Heart failure, unspecified: Secondary | ICD-10-CM | POA: Diagnosis not present

## 2016-05-23 DIAGNOSIS — N186 End stage renal disease: Secondary | ICD-10-CM | POA: Insufficient documentation

## 2016-05-23 DIAGNOSIS — I132 Hypertensive heart and chronic kidney disease with heart failure and with stage 5 chronic kidney disease, or end stage renal disease: Secondary | ICD-10-CM | POA: Diagnosis not present

## 2016-05-23 DIAGNOSIS — Z87891 Personal history of nicotine dependence: Secondary | ICD-10-CM | POA: Diagnosis not present

## 2016-05-23 DIAGNOSIS — E039 Hypothyroidism, unspecified: Secondary | ICD-10-CM | POA: Insufficient documentation

## 2016-05-23 DIAGNOSIS — R079 Chest pain, unspecified: Secondary | ICD-10-CM | POA: Insufficient documentation

## 2016-05-23 DIAGNOSIS — Z7982 Long term (current) use of aspirin: Secondary | ICD-10-CM | POA: Diagnosis not present

## 2016-05-23 DIAGNOSIS — E1122 Type 2 diabetes mellitus with diabetic chronic kidney disease: Secondary | ICD-10-CM | POA: Insufficient documentation

## 2016-05-23 DIAGNOSIS — J40 Bronchitis, not specified as acute or chronic: Secondary | ICD-10-CM | POA: Diagnosis not present

## 2016-05-23 LAB — COMPREHENSIVE METABOLIC PANEL
ALT: 7 U/L — AB (ref 14–54)
AST: 28 U/L (ref 15–41)
Albumin: 3.7 g/dL (ref 3.5–5.0)
Alkaline Phosphatase: 62 U/L (ref 38–126)
Anion gap: 17 — ABNORMAL HIGH (ref 5–15)
BILIRUBIN TOTAL: 0.6 mg/dL (ref 0.3–1.2)
BUN: 73 mg/dL — AB (ref 6–20)
CO2: 20 mmol/L — ABNORMAL LOW (ref 22–32)
CREATININE: 11.4 mg/dL — AB (ref 0.44–1.00)
Calcium: 7 mg/dL — ABNORMAL LOW (ref 8.9–10.3)
Chloride: 101 mmol/L (ref 101–111)
GFR, EST AFRICAN AMERICAN: 4 mL/min — AB (ref >60)
GFR, EST NON AFRICAN AMERICAN: 3 mL/min — AB (ref >60)
Glucose, Bld: 153 mg/dL — ABNORMAL HIGH (ref 65–99)
POTASSIUM: 4.6 mmol/L (ref 3.5–5.1)
Sodium: 138 mmol/L (ref 135–145)
TOTAL PROTEIN: 7 g/dL (ref 6.5–8.1)

## 2016-05-23 LAB — CBC WITH DIFFERENTIAL/PLATELET
BASOS ABS: 0 10*3/uL (ref 0.0–0.1)
Basophils Relative: 0 %
EOS PCT: 14 %
Eosinophils Absolute: 1.3 10*3/uL — ABNORMAL HIGH (ref 0.0–0.7)
HEMATOCRIT: 34.7 % — AB (ref 36.0–46.0)
Hemoglobin: 11.4 g/dL — ABNORMAL LOW (ref 12.0–15.0)
LYMPHS ABS: 1.4 10*3/uL (ref 0.7–4.0)
LYMPHS PCT: 16 %
MCH: 35 pg — AB (ref 26.0–34.0)
MCHC: 32.9 g/dL (ref 30.0–36.0)
MCV: 106.4 fL — AB (ref 78.0–100.0)
MONO ABS: 0.5 10*3/uL (ref 0.1–1.0)
Monocytes Relative: 5 %
NEUTROS ABS: 6 10*3/uL (ref 1.7–7.7)
Neutrophils Relative %: 65 %
Platelets: 254 10*3/uL (ref 150–400)
RBC: 3.26 MIL/uL — ABNORMAL LOW (ref 3.87–5.11)
RDW: 14.5 % (ref 11.5–15.5)
WBC: 9.3 10*3/uL (ref 4.0–10.5)

## 2016-05-23 LAB — TROPONIN I: Troponin I: 0.04 ng/mL (ref <0.03)

## 2016-05-23 MED ORDER — IPRATROPIUM BROMIDE 0.02 % IN SOLN
0.5000 mg | Freq: Once | RESPIRATORY_TRACT | Status: AC
  Administered 2016-05-23: 0.5 mg via RESPIRATORY_TRACT
  Filled 2016-05-23: qty 2.5

## 2016-05-23 MED ORDER — ALBUTEROL SULFATE (2.5 MG/3ML) 0.083% IN NEBU
5.0000 mg | INHALATION_SOLUTION | Freq: Once | RESPIRATORY_TRACT | Status: AC
  Administered 2016-05-23: 5 mg via RESPIRATORY_TRACT
  Filled 2016-05-23: qty 6

## 2016-05-23 MED ORDER — ALBUTEROL SULFATE HFA 108 (90 BASE) MCG/ACT IN AERS
2.0000 | INHALATION_SPRAY | RESPIRATORY_TRACT | Status: DC
  Administered 2016-05-23: 2 via RESPIRATORY_TRACT
  Filled 2016-05-23: qty 6.7

## 2016-05-23 MED ORDER — PREDNISONE 20 MG PO TABS
60.0000 mg | ORAL_TABLET | Freq: Once | ORAL | Status: AC
  Administered 2016-05-23: 60 mg via ORAL
  Filled 2016-05-23: qty 3

## 2016-05-23 MED ORDER — PREDNISONE 20 MG PO TABS: 40.0000 mg | ORAL_TABLET | Freq: Every day | ORAL | 0 refills | Status: DC

## 2016-05-23 NOTE — ED Notes (Signed)
Pt transported to xray 

## 2016-05-23 NOTE — ED Triage Notes (Signed)
Pt arrived via EMS with c/o chest pain and shortness of breath starting at 0000 tonight. Pt was due for dialysis today and normally gets it tue, thurs, sat. But was having worsening symptoms.

## 2016-05-23 NOTE — ED Notes (Signed)
ED Provider at bedside. 

## 2016-05-23 NOTE — ED Notes (Signed)
Pt called dialysis and son is taking pt there now.

## 2016-05-23 NOTE — ED Provider Notes (Signed)
[facility] DEPT Provider Note   CSN: [id number] Arrival date & time: 05/23/16  0701     History   Chief Complaint Chief Complaint  Patient presents with  . Chest Pain  . Shortness of Breath    HPI Roberta Bryant is a 65 y.o. female.  HPI Patient presents to the emergency department with complaints of low back pain and mild sharp anterior chest pain which began today.  She also felt short of breath and began having some wheezing. She's had some new cough over the past 3-4 days which is productive in nature.  She has dialysis on Tuesday Thursday Saturday as scheduled for dialysis today.  She had normal dialysis on Thursday.  She has a long-standing history of recurrent back pain.  She also has a history of congestive heart failure as well as reactive airway disease.  She has no anterior chest pain this time.  She denies abdominal pain.  She reports mild shortness of breath and feels like she is wheezing.  No recent cough.  Denies orthopnea.   Past Medical History:  Diagnosis Date  . Anemia   . CHF (congestive heart failure) ([place])   . Childhood asthma   . Chronic upper back pain   . ESRD (end stage renal disease) on dialysis ([place])    "TTS; [facility] in [place]" (10/30/2014)  . GERD (gastroesophageal reflux disease)   . Headache    "a few times/week" (10/30/2014)  . History of blood transfusion 1996   "related to menses"  . Hyperlipidemia   . Hypertension   . Hypothyroidism   . IBS (irritable bowel syndrome)   . Myocardial infarction 2004  . Osteoarthritis   . Type II diabetes mellitus [facility])     Patient Active Problem List   Diagnosis Date Noted  . Chest pain 11/23/2015  . ESRD on dialysis ([place])   . Type 2 diabetes mellitus with complication ([place])   . Essential hypertension   . Abnormal ECG   . Pain in the chest   . Hypotension 10/30/2014  . Abnormal EKG 10/30/2014  . 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 End stage renal disease  ([place]) 07/18/2012  . Other complications due to renal dialysis device, implant, and graft 07/18/2012    Past Surgical History:  Procedure Laterality Date  . APPENDECTOMY    . AV FISTULA PLACEMENT Left 12/03/2006   "forearm"  . AV FISTULA REPAIR Left 11/25/2009   "forearm"  . CHOLECYSTECTOMY OPEN  1970's  . HERNIA REPAIR    . INSERTION OF DIALYSIS CATHETER Right 2010   "chest"  . PARATHYROIDECTOMY  2010?   Parathyroid autotransplantation   . REVISON OF ARTERIOVENOUS FISTULA Left 12/04/2013   Procedure: REVISON OF ARTERIOVENOUS FISTULA;  Surgeon: Rosetta Posner, MD;  Location: [facility];  Service: Vascular;  Laterality: Left;  . TUBAL LIGATION  1989  . UMBILICAL HERNIA REPAIR  1990's X 2    OB History    No data available       Home Medications    Prior to Admission medications   Medication Sig Start Date End Date Taking? Authorizing Provider  acetaminophen (TYLENOL) 325 MG tablet Take 650 mg by mouth every 6 (six) hours as needed.    Historical Provider, MD  aspirin EC 81 MG tablet Take 81 mg by mouth every morning.    Historical Provider, MD  calcium acetate (PHOSLO) 667 MG capsule Take 1,334-2,668 mg by mouth 3 (three) times daily with meals. Takes 4  capsules three times daily before each meal and taks 2 caps with snacks when eating    Historical Provider, MD  carbidopa-levodopa (SINEMET) 25-100 MG per tablet Take 1 tablet by mouth 4 (four) times daily.  02/21/14 02/27/16  Historical Provider, MD  carvedilol (COREG) 12.5 MG tablet Take 25 mg by mouth at bedtime.     Historical Provider, MD  ciprofloxacin (CIPRO) 500 MG tablet Take 500 mg by mouth daily with breakfast.    Historical Provider, MD  diclofenac sodium (VOLTAREN) 1 % GEL Apply 4 g topically 4 (four) times daily as needed (pain).     Historical Provider, MD  docusate sodium (COLACE) 100 MG capsule Take 100 mg by mouth 2 (two) times daily.    Historical Provider, MD  fenofibrate 120 MG TABS Take 1 tablet (120 mg total) by  mouth daily. 11/24/15   Vivi Barrack, MD  finasteride (PROSCAR) 5 MG tablet Take 2.5 mg by mouth daily.    Historical Provider, MD  HYDROcodone-acetaminophen (NORCO/VICODIN) 5-325 MG tablet Take 1 tablet by mouth every 6 (six) hours as needed for moderate pain.    Historical Provider, MD  HYDROcodone-acetaminophen (NORCO/VICODIN) 5-325 MG tablet Take 1 tablet by mouth every 4 (four) hours as needed. 02/27/16   Alfonzo Beers, MD  levothyroxine (SYNTHROID, LEVOTHROID) 50 MCG tablet Take 50 mcg by mouth daily before breakfast.     Historical Provider, MD  loperamide (IMODIUM A-D) 2 MG tablet Take 2 mg by mouth 4 (four) times daily as needed for diarrhea or loose stools.    Historical Provider, MD  meclizine (ANTIVERT) 25 MG tablet Take 12.5-25 mg by mouth 3 (three) times daily as needed for dizziness.     Historical Provider, MD  methocarbamol (ROBAXIN) 500 MG tablet Take 1 tablet (500 mg total) by mouth 2 (two) times daily. Patient not taking: Reported on 05/11/2016 02/27/16   Alfonzo Beers, MD  multivitamin (RENA-VIT) TABS tablet Take 1 tablet by mouth daily.     Historical Provider, MD  omeprazole (PRILOSEC) 20 MG capsule Take 20 mg by mouth 2 (two) times daily before a meal.    Historical Provider, MD  prochlorperazine (COMPAZINE) 5 MG tablet Take 5 mg by mouth every 6 (six) hours as needed for nausea or vomiting (for dialysis).    Historical Provider, MD  sitaGLIPtin (JANUVIA) 25 MG tablet Take 25 mg by mouth daily.    Historical Provider, MD  ticagrelor (BRILINTA) 90 MG TABS tablet Take 90 mg by mouth 2 (two) times daily.    Historical Provider, MD  tizanidine (ZANAFLEX) 2 MG capsule Take 2 mg by mouth 3 (three) times daily as needed for muscle spasms.    Historical Provider, MD  Vitamin D, Ergocalciferol, (DRISDOL) 50000 units CAPS capsule Take 50,000 Units by mouth every Sunday.    Historical Provider, MD  Zinc 50 MG CAPS Take 1 capsule by mouth every morning.     Historical Provider, MD     Family History History reviewed. No pertinent family history.  Social History Social History  Substance Use Topics  . Smoking status: Former Smoker    Years: 3.00    Types: Cigarettes  . Smokeless tobacco: Never Used     Comment: "stopped smoking in the 1970's"  . Alcohol use No     Allergies   Crestor [rosuvastatin calcium]; Sulfa antibiotics; Amoxicillin; Chlorphen-phenyleph-asa; Ibuprofen; and [place] senior [guaifenesin]   Review of Systems Review of Systems  All other systems reviewed and are negative.  Physical Exam Updated Vital Signs BP 155/86   Pulse 69   Temp 98.3 F (36.8 C) (Oral)   Resp 18   Ht 4\' 11"  (1.499 m)   Wt 220 lb (99.8 kg)   SpO2 99%   BMI 44.43 kg/m   Physical Exam  Constitutional: She is oriented to person, place, and time. She appears well-developed and well-nourished. No distress.  HENT:  Head: Normocephalic and atraumatic.  Eyes: EOM are normal.  Neck: Normal range of motion.  Cardiovascular: Normal rate, regular rhythm and normal heart sounds.   Pulmonary/Chest: Effort normal and breath sounds normal. She has no wheezes.  Abdominal: Soft. She exhibits no distension. There is no tenderness.  Musculoskeletal: Normal range of motion. She exhibits no edema.  Neurological: She is alert and oriented to person, place, and time.  Skin: Skin is warm and dry.  Psychiatric: She has a normal mood and affect. Judgment normal.  Nursing note and vitals reviewed.    ED Treatments / Results  Labs (all labs ordered are listed, but only abnormal results are displayed) Labs Reviewed  CBC WITH DIFFERENTIAL/PLATELET - Abnormal; Notable for the following:       Result Value   RBC 3.26 (*)    Hemoglobin 11.4 (*)    HCT 34.7 (*)    MCV 106.4 (*)    MCH 35.0 (*)    Eosinophils Absolute 1.3 (*)    All other components within normal limits  COMPREHENSIVE METABOLIC PANEL - Abnormal; Notable for the following:    CO2 20 (*)    Glucose, Bld  153 (*)    BUN 73 (*)    Creatinine, Ser 11.40 (*)    Calcium 7.0 (*)    ALT 7 (*)    GFR calc non Af Amer 3 (*)    GFR calc Af Amer 4 (*)    Anion gap 17 (*)    All other components within normal limits  TROPONIN I - Abnormal; Notable for the following:    Troponin I 0.04 (*)    All other components within normal limits    EKG  EKG Interpretation  Date/Time:  Saturday May 23 2016 07:13:15 EST Ventricular Rate:  71 PR Interval:    QRS Duration: 94 QT Interval:  456 QTC Calculation: 496 R Axis:   -20 Text Interpretation:  Sinus rhythm Borderline left axis deviation Borderline prolonged QT interval No significant change was found Confirmed by Ian Cavey  MD, Kiah Vanalstine ([id number]) on 05/23/2016 8:11:44 AM       Radiology Dg Chest 2 View  Result Date: 05/23/2016 CLINICAL DATA:  Cough for 1 month. Wheezing and chest pain. Shortness of breath. EXAM: CHEST  2 VIEW COMPARISON:  02/27/2016 FINDINGS: Linear densities in the lung bases compatible with atelectasis. Heart is normal size. No visible effusions or acute bony abnormality. Old healed left rib fracture. IMPRESSION: Bibasilar atelectasis. Electronically Signed   By: Rolm Baptise M.D.   On: 05/23/2016 08:17    Procedures Procedures (including critical care time)  Medications Ordered in ED Medications  albuterol (PROVENTIL HFA;VENTOLIN HFA) 108 (90 Base) MCG/ACT inhaler 2 puff (2 puffs Inhalation Given 05/23/16 1004)  albuterol (PROVENTIL) (2.5 MG/3ML) 0.083% nebulizer solution 5 mg (5 mg Nebulization Given 05/23/16 0826)  ipratropium (ATROVENT) nebulizer solution 0.5 mg (0.5 mg Nebulization Given 05/23/16 0826)  predniSONE (DELTASONE) tablet 60 mg (60 mg Oral Given 05/23/16 1003)     Initial Impression / Assessment and Plan / ED Course  I have reviewed the  triage vital signs and the nursing notes.  Pertinent labs & imaging results that were available during my care of the patient were reviewed by me and considered in my  medical decision making (see chart for details).  Clinical Course    Patient likely bronchitis with bronchospasm.  Doubt ACS.  Doubt PE.  Feels better after albuterol.  Patient be discharged home with short course of prednisone and albuterol.  She understands return to the ER for new or worsening symptoms.  She has contacted her dialysis unit and will go to dialysis today.  Overall she is well appearing.  She can be discharged home safely at this time.  She understands return to the ER for new or worsening symptoms   Final Clinical Impressions(s) / ED Diagnoses   Final diagnoses:  Chest pain, unspecified type  Bronchitis    New Prescriptions New Prescriptions   No medications on file     Jola Schmidt, MD 05/23/16 1028

## 2016-05-23 NOTE — ED Notes (Signed)
Pt ambulated to and from restroom. 

## 2016-05-24 ENCOUNTER — Inpatient Hospital Stay: Admission: RE | Admit: 2016-05-24 | Payer: Medicare Other | Source: Ambulatory Visit

## 2016-05-27 ENCOUNTER — Ambulatory Visit (INDEPENDENT_AMBULATORY_CARE_PROVIDER_SITE_OTHER): Payer: Medicare Other | Admitting: Orthopaedic Surgery

## 2016-06-05 ENCOUNTER — Ambulatory Visit
Admission: RE | Admit: 2016-06-05 | Discharge: 2016-06-05 | Disposition: A | Discharge status: Home / Self Care | Payer: Medicare Other | Source: Ambulatory Visit | Attending: Orthopaedic Surgery | Admitting: Orthopaedic Surgery

## 2016-06-05 DIAGNOSIS — G8929 Other chronic pain: Secondary | ICD-10-CM

## 2016-06-05 DIAGNOSIS — M25511 Pain in right shoulder: Principal | ICD-10-CM

## 2016-06-13 ENCOUNTER — Emergency Department (HOSPITAL_COMMUNITY): Payer: Medicare Other

## 2016-06-13 ENCOUNTER — Emergency Department (HOSPITAL_COMMUNITY)
Admission: EM | Admit: 2016-06-13 | Discharge: 2016-06-13 | Disposition: A | Discharge status: Home / Self Care | Payer: Medicare Other | Attending: Emergency Medicine | Admitting: Emergency Medicine

## 2016-06-13 ENCOUNTER — Encounter (HOSPITAL_COMMUNITY): Payer: Self-pay | Admitting: Emergency Medicine

## 2016-06-13 DIAGNOSIS — N186 End stage renal disease: Secondary | ICD-10-CM | POA: Diagnosis not present

## 2016-06-13 DIAGNOSIS — Y939 Activity, unspecified: Secondary | ICD-10-CM | POA: Diagnosis not present

## 2016-06-13 DIAGNOSIS — I252 Old myocardial infarction: Secondary | ICD-10-CM | POA: Insufficient documentation

## 2016-06-13 DIAGNOSIS — Z7982 Long term (current) use of aspirin: Secondary | ICD-10-CM | POA: Insufficient documentation

## 2016-06-13 DIAGNOSIS — Z992 Dependence on renal dialysis: Secondary | ICD-10-CM | POA: Diagnosis not present

## 2016-06-13 DIAGNOSIS — M25532 Pain in left wrist: Secondary | ICD-10-CM | POA: Diagnosis not present

## 2016-06-13 DIAGNOSIS — Z87891 Personal history of nicotine dependence: Secondary | ICD-10-CM | POA: Diagnosis not present

## 2016-06-13 DIAGNOSIS — S0990XA Unspecified injury of head, initial encounter: Secondary | ICD-10-CM | POA: Diagnosis present

## 2016-06-13 DIAGNOSIS — W010XXA Fall on same level from slipping, tripping and stumbling without subsequent striking against object, initial encounter: Secondary | ICD-10-CM | POA: Insufficient documentation

## 2016-06-13 DIAGNOSIS — Y999 Unspecified external cause status: Secondary | ICD-10-CM | POA: Insufficient documentation

## 2016-06-13 DIAGNOSIS — E1122 Type 2 diabetes mellitus with diabetic chronic kidney disease: Secondary | ICD-10-CM | POA: Insufficient documentation

## 2016-06-13 DIAGNOSIS — S00531A Contusion of lip, initial encounter: Secondary | ICD-10-CM | POA: Diagnosis not present

## 2016-06-13 DIAGNOSIS — I132 Hypertensive heart and chronic kidney disease with heart failure and with stage 5 chronic kidney disease, or end stage renal disease: Secondary | ICD-10-CM | POA: Insufficient documentation

## 2016-06-13 DIAGNOSIS — I509 Heart failure, unspecified: Secondary | ICD-10-CM | POA: Diagnosis not present

## 2016-06-13 DIAGNOSIS — E039 Hypothyroidism, unspecified: Secondary | ICD-10-CM | POA: Diagnosis not present

## 2016-06-13 DIAGNOSIS — J3489 Other specified disorders of nose and nasal sinuses: Secondary | ICD-10-CM | POA: Insufficient documentation

## 2016-06-13 DIAGNOSIS — M25531 Pain in right wrist: Secondary | ICD-10-CM | POA: Diagnosis not present

## 2016-06-13 DIAGNOSIS — Y929 Unspecified place or not applicable: Secondary | ICD-10-CM | POA: Diagnosis not present

## 2016-06-13 DIAGNOSIS — R040 Epistaxis: Secondary | ICD-10-CM | POA: Insufficient documentation

## 2016-06-13 DIAGNOSIS — W19XXXA Unspecified fall, initial encounter: Secondary | ICD-10-CM

## 2016-06-13 MED ORDER — ACETAMINOPHEN 325 MG PO TABS
650.0000 mg | ORAL_TABLET | Freq: Once | ORAL | Status: AC
  Administered 2016-06-13: 650 mg via ORAL
  Filled 2016-06-13: qty 2

## 2016-06-13 MED ORDER — OXYMETAZOLINE HCL 0.05 % NA SOLN
1.0000 | Freq: Once | NASAL | Status: AC
  Administered 2016-06-13: 1 via NASAL
  Filled 2016-06-13: qty 15

## 2016-06-13 NOTE — ED Triage Notes (Signed)
Per EMS states patient was sweeping floor and went to use dust pan to pick up dirt and fell face first-ambulatory on scene-complaining of B/L wrist pain-upper lip and nose injured-bleeding controlled-swelling to both wrists-patient says wrists are normally swollen but after fall they are more swollen-patient had dialysis today

## 2016-06-13 NOTE — ED Provider Notes (Addendum)
[facility] DEPT Provider Note   CSN: [id number] Arrival date & time: 06/13/16  1517     History   Chief Complaint Chief Complaint  Patient presents with  . Fall    HPI Roberta Bryant is a 66 y.o. female.  66 year old African-American female with pmh significant for ESRD on dialysis hypertension, hypothyroidism, diabetes, congestive heart failure presents to the emergency department today due to a fall prior to arrival. Patient states that she was sleeping the floor went to use a dustpan and slipped on the dust pan and fell face first on her face. Patient complains of nasal pain, headache, bilateral wrist pain. Patient does have bleeding out of the right nostril. She's been using pressure with a rag. Patient complains of slight headache. She denies any neck pain or vision changes. Patient hasn't tried anything for the pain prior to arrival. She denies any fever, chills, vision changes, neck pain, lightheadedness, dizziness, cough, chest pain, shortness of breath, abdominal pain, pelvic pain, urinary symptoms, change in bowel habits, numbness/tingling.      Past Medical History:  Diagnosis Date  . Anemia   . CHF (congestive heart failure) ([place])   . Childhood asthma   . Chronic upper back pain   . ESRD (end stage renal disease) on dialysis ([place])    "TTS; [facility] in [place]" (10/30/2014)  . GERD (gastroesophageal reflux disease)   . Headache    "a few times/week" (10/30/2014)  . History of blood transfusion 1996   "related to menses"  . Hyperlipidemia   . Hypertension   . Hypothyroidism   . IBS (irritable bowel syndrome)   . Myocardial infarction 2004  . Osteoarthritis   . Type II diabetes mellitus [facility])     Patient Active Problem List   Diagnosis Date Noted  . Chest pain 11/23/2015  . ESRD on dialysis ([place])   . Type 2 diabetes mellitus with complication ([place])   . Essential hypertension   . Abnormal ECG   . Pain in the chest   . Hypotension 10/30/2014  .  Abnormal EKG 10/30/2014  . 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 End stage renal disease ([place]) 07/18/2012  . Other complications due to renal dialysis device, implant, and graft 07/18/2012    Past Surgical History:  Procedure Laterality Date  . APPENDECTOMY    . AV FISTULA PLACEMENT Left 12/03/2006   "forearm"  . AV FISTULA REPAIR Left 11/25/2009   "forearm"  . CHOLECYSTECTOMY OPEN  1970's  . HERNIA REPAIR    . INSERTION OF DIALYSIS CATHETER Right 2010   "chest"  . PARATHYROIDECTOMY  2010?   Parathyroid autotransplantation   . REVISON OF ARTERIOVENOUS FISTULA Left 12/04/2013   Procedure: REVISON OF ARTERIOVENOUS FISTULA;  Surgeon: Rosetta Posner, MD;  Location: [facility];  Service: Vascular;  Laterality: Left;  . TUBAL LIGATION  1989  . UMBILICAL HERNIA REPAIR  1990's X 2    OB History    No data available       Home Medications    Prior to Admission medications   Medication Sig Start Date End Date Taking? Authorizing Provider  acetaminophen (TYLENOL) 325 MG tablet Take 650 mg by mouth every 6 (six) hours as needed for moderate pain or headache.     Historical Provider, MD  aspirin EC 81 MG tablet Take 81 mg by mouth every morning.    Historical Provider, MD  carbidopa-levodopa (SINEMET) 25-100 MG per tablet Take 1 tablet by mouth 3 (three) times  daily.  02/21/14 05/23/16  Historical Provider, MD  carvedilol (COREG) 12.5 MG tablet Take 25 mg by mouth at bedtime.     Historical Provider, MD  diclofenac sodium (VOLTAREN) 1 % GEL Apply 4 g topically 4 (four) times daily as needed (pain).     Historical Provider, MD  docusate sodium (COLACE) 100 MG capsule Take 100 mg by mouth 2 (two) times daily.    Historical Provider, MD  ezetimibe (ZETIA) 10 MG tablet Take 10 mg by mouth daily. 05/10/16   Historical Provider, MD  fenofibrate (TRICOR) 145 MG tablet Take 145 mg by mouth daily. 05/15/16   Historical Provider, MD  fenofibrate 120 MG TABS Take 1 tablet (120 mg  total) by mouth daily. Patient not taking: Reported on 05/23/2016 11/24/15   Vivi Barrack, MD  ferric citrate (AURYXIA) 1 GM 210 MG(Fe) tablet Take 420 mg by mouth 3 (three) times daily with meals.    Historical Provider, MD  finasteride (PROSCAR) 5 MG tablet Take 2.5 mg by mouth daily.    Historical Provider, MD  HYDROcodone-acetaminophen (NORCO/VICODIN) 5-325 MG tablet Take 1 tablet by mouth every 6 (six) hours as needed for moderate pain.    Historical Provider, MD  HYDROcodone-acetaminophen (NORCO/VICODIN) 5-325 MG tablet Take 1 tablet by mouth every 4 (four) hours as needed. Patient not taking: Reported on 05/23/2016 02/27/16   Alfonzo Beers, MD  levothyroxine (SYNTHROID, LEVOTHROID) 50 MCG tablet Take 50 mcg by mouth daily before breakfast.     Historical Provider, MD  loperamide (IMODIUM A-D) 2 MG tablet Take 2 mg by mouth 4 (four) times daily as needed for diarrhea or loose stools.    Historical Provider, MD  meclizine (ANTIVERT) 25 MG tablet Take 12.5-25 mg by mouth 3 (three) times daily as needed for dizziness.     Historical Provider, MD  methocarbamol (ROBAXIN) 500 MG tablet Take 1 tablet (500 mg total) by mouth 2 (two) times daily. Patient not taking: Reported on 05/23/2016 02/27/16   Alfonzo Beers, MD  multivitamin (RENA-VIT) TABS tablet Take 1 tablet by mouth daily.     Historical Provider, MD  omeprazole (PRILOSEC) 20 MG capsule Take 20 mg by mouth 2 (two) times daily before a meal.    Historical Provider, MD  predniSONE (DELTASONE) 20 MG tablet Take 2 tablets (40 mg total) by mouth daily. 05/23/16   Jola Schmidt, MD  prochlorperazine (COMPAZINE) 5 MG tablet Take 5 mg by mouth every 6 (six) hours as needed for nausea or vomiting (for dialysis).    Historical Provider, MD  sitaGLIPtin (JANUVIA) 25 MG tablet Take 25 mg by mouth daily.    Historical Provider, MD  ticagrelor (BRILINTA) 90 MG TABS tablet Take 90 mg by mouth 2 (two) times daily.    Historical Provider, MD  tizanidine  (ZANAFLEX) 2 MG capsule Take 2 mg by mouth 3 (three) times daily as needed for muscle spasms.    Historical Provider, MD  Vitamin D, Ergocalciferol, (DRISDOL) 50000 units CAPS capsule Take 50,000 Units by mouth every Sunday.    Historical Provider, MD  Zinc 50 MG CAPS Take 50 mg by mouth every morning.     Historical Provider, MD    Family History No family history on file.  Social History Social History  Substance Use Topics  . Smoking status: Former Smoker    Years: 3.00    Types: Cigarettes  . Smokeless tobacco: Never Used     Comment: "stopped smoking in the 1970's"  . Alcohol use  No     Allergies   Crestor [rosuvastatin calcium]; Sulfa antibiotics; Amoxicillin; Chlorphen-phenyleph-asa; Ibuprofen; and Nal[place] senior [guaifenesin]   Review of Systems Review of Systems  Constitutional: Negative for chills and fever.  HENT: Positive for nosebleeds. Negative for congestion.   Eyes: Negative for pain and visual disturbance.  Respiratory: Negative for cough and shortness of breath.   Cardiovascular: Negative for chest pain and leg swelling.  Gastrointestinal: Negative for abdominal pain, diarrhea, nausea and vomiting.  Genitourinary: Negative for dysuria, frequency and urgency.  Musculoskeletal: Positive for arthralgias. Negative for back pain and neck pain.  Neurological: Negative for dizziness, syncope, weakness, light-headedness, numbness and headaches.  All other systems reviewed and are negative.    Physical Exam Updated Vital Signs BP 146/82 (BP Location: Right Arm)   Pulse 82   Temp 98.5 F (36.9 C) (Oral)   Resp 16   SpO2 98%   Physical Exam  Constitutional: She is oriented to person, place, and time. She appears well-developed and well-nourished. No distress.  HENT:  Head: Normocephalic and atraumatic. Head is without raccoon's eyes and without Battle's sign.  Right Ear: Tympanic membrane, external ear and ear canal normal.  Left Ear: Tympanic membrane,  external ear and ear canal normal.  Nose: Sinus tenderness (bridge of nose.) present. Nasal septal hematoma: right nare. clot present bleeding controled. Epistaxis is observed.  Mouth/Throat: Uvula is midline, oropharynx is clear and moist and mucous membranes are normal.  Patient with bruising to the anterior lip without any laceration. No loose dentition.   Eyes: Conjunctivae and EOM are normal. Pupils are equal, round, and reactive to light. Right eye exhibits no discharge. Left eye exhibits no discharge.  Neck: Normal range of motion. Neck supple.  No midline C-spine tenderness. No deformities or step-offs noted.  Cardiovascular: Normal rate, regular rhythm, normal heart sounds and intact distal pulses.  Exam reveals no gallop and no friction rub.   No murmur heard. Pulmonary/Chest: Effort normal and breath sounds normal. No respiratory distress.  Abdominal: Soft. Bowel sounds are normal. She exhibits no distension. There is no rebound and no guarding.  Musculoskeletal: Normal range of motion.  Pelvis is stable without any pain. Full ROM. Moving all 4 extremities without any difficulties. Patient complains of bilateral wrist pain with movement. Full range of motion. Tenderness to the radial aspect of both wrists. Mild edema is noted the patient states this is baseline. No ecchymosis, deformity, erythema, wheezing noted. Radial pulses are 2+ bilaterally. Capillary refill is normal. Sensation intact. No pain with movement of the elbow or shoulder joint.  Patient has no midline T-spine or L-spine tenderness. No deformities or step-offs noted.    Lymphadenopathy:    She has no cervical adenopathy.  Neurological: She is alert and oriented to person, place, and time. GCS eye subscore is 4. GCS verbal subscore is 5. GCS motor subscore is 6.  The patient is alert, attentive, and oriented x 3. Speech is clear. Cranial nerve II-VII grossly intact. Negative pronator drift. Sensation intact. Strength  5/5 in all extremities. Reflexes 2+ and symmetric at biceps, triceps, knees, and ankles. Rapid alternating movement and fine finger movements intact. Romberg is absent. Posture and gait normal.   Skin: Skin is warm and dry. Capillary refill takes less than 2 seconds.  Nursing note and vitals reviewed.    ED Treatments / Results  Labs (all labs ordered are listed, but only abnormal results are displayed) Labs Reviewed - No data to display  EKG  EKG  Interpretation None       Radiology Dg Wrist Complete Left  Result Date: 06/13/2016 CLINICAL DATA:  Bilateral wrist pain due to a fall today. Initial encounter. EXAM: LEFT WRIST - COMPLETE 3+ VIEW COMPARISON:  None. FINDINGS: There is no evidence of fracture or dislocation. There is no evidence of arthropathy or other focal bone abnormality. Ulnar minus variance is incidentally noted. Soft tissues are unremarkable. IMPRESSION: Negative exam. Electronically Signed   By: Inge Rise M.D.   On: 06/13/2016 16:32   Dg Wrist Complete Right  Result Date: 06/13/2016 CLINICAL DATA:  Bilateral wrist pain due to a fall today. Initial encounter. EXAM: RIGHT WRIST - COMPLETE 3+ VIEW COMPARISON:  None. FINDINGS: There is no evidence of fracture or dislocation. There is no evidence of arthropathy or other focal bone abnormality. Ulnar minus variance is incidentally noted. Soft tissues are unremarkable. IMPRESSION: Negative. Electronically Signed   By: Inge Rise M.D.   On: 06/13/2016 16:33   Ct Head Wo Contrast  Result Date: 06/13/2016 CLINICAL DATA:  Fall EXAM: CT HEAD WITHOUT CONTRAST CT MAXILLOFACIAL WITHOUT CONTRAST CT CERVICAL SPINE WITHOUT CONTRAST TECHNIQUE: Multidetector CT imaging of the head, cervical spine, and maxillofacial structures were performed using the standard protocol without intravenous contrast. Multiplanar CT image reconstructions of the cervical spine and maxillofacial structures were also generated. COMPARISON:  CT head  05/30/2013 FINDINGS: CT HEAD FINDINGS Brain: Ventricle size normal. Chronic ischemic changes in the white matter. Chronic left parietal infarct unchanged. Small chronic infarct left head of caudate. Negative for acute infarct. Negative for acute hemorrhage or mass. No shift of the midline structures. Vascular: No hyperdense vessel or unexpected calcification. Skull: Negative for fracture. Other: Negative CT MAXILLOFACIAL FINDINGS Osseous: Negative for facial fracture. No fracture of the nasal bone or orbit. Orbits: Normal orbital contents. Sinuses: Mucosal edema in the paranasal sinuses, mild. No air-fluid level. Soft tissues: Soft tissue filling the right nares and nasal cavity, likely blood. Nasal septum deviated to the left but not fractured. CT CERVICAL SPINE FINDINGS Alignment: Normal Skull base and vertebrae: Negative for fracture Soft tissues and spinal canal: Atherosclerotic calcification of the carotid artery bilaterally. No soft tissue mass or adenopathy. Disc levels: Disc and facet degeneration throughout the cervical spine without significant stenosis. Lung apices clear Upper chest: Lung apices clear. Other: None IMPRESSION: Chronic ischemic change.  No acute intracranial abnormality Negative for facial fracture. Probable blood in the right nasal cavity and nares. Cervical degenerative changes.  Negative for fracture. Electronically Signed   By: Franchot Gallo M.D.   On: 06/13/2016 17:17   Ct Cervical Spine Wo Contrast  Result Date: 06/13/2016 CLINICAL DATA:  Fall EXAM: CT HEAD WITHOUT CONTRAST CT MAXILLOFACIAL WITHOUT CONTRAST CT CERVICAL SPINE WITHOUT CONTRAST TECHNIQUE: Multidetector CT imaging of the head, cervical spine, and maxillofacial structures were performed using the standard protocol without intravenous contrast. Multiplanar CT image reconstructions of the cervical spine and maxillofacial structures were also generated. COMPARISON:  CT head 05/30/2013 FINDINGS: CT HEAD FINDINGS Brain:  Ventricle size normal. Chronic ischemic changes in the white matter. Chronic left parietal infarct unchanged. Small chronic infarct left head of caudate. Negative for acute infarct. Negative for acute hemorrhage or mass. No shift of the midline structures. Vascular: No hyperdense vessel or unexpected calcification. Skull: Negative for fracture. Other: Negative CT MAXILLOFACIAL FINDINGS Osseous: Negative for facial fracture. No fracture of the nasal bone or orbit. Orbits: Normal orbital contents. Sinuses: Mucosal edema in the paranasal sinuses, mild. No air-fluid level. Soft tissues:  Soft tissue filling the right nares and nasal cavity, likely blood. Nasal septum deviated to the left but not fractured. CT CERVICAL SPINE FINDINGS Alignment: Normal Skull base and vertebrae: Negative for fracture Soft tissues and spinal canal: Atherosclerotic calcification of the carotid artery bilaterally. No soft tissue mass or adenopathy. Disc levels: Disc and facet degeneration throughout the cervical spine without significant stenosis. Lung apices clear Upper chest: Lung apices clear. Other: None IMPRESSION: Chronic ischemic change.  No acute intracranial abnormality Negative for facial fracture. Probable blood in the right nasal cavity and nares. Cervical degenerative changes.  Negative for fracture. Electronically Signed   By: Franchot Gallo M.D.   On: 06/13/2016 17:17   Ct Maxillofacial Wo Cm  Result Date: 06/13/2016 CLINICAL DATA:  Fall EXAM: CT HEAD WITHOUT CONTRAST CT MAXILLOFACIAL WITHOUT CONTRAST CT CERVICAL SPINE WITHOUT CONTRAST TECHNIQUE: Multidetector CT imaging of the head, cervical spine, and maxillofacial structures were performed using the standard protocol without intravenous contrast. Multiplanar CT image reconstructions of the cervical spine and maxillofacial structures were also generated. COMPARISON:  CT head 05/30/2013 FINDINGS: CT HEAD FINDINGS Brain: Ventricle size normal. Chronic ischemic changes in the  white matter. Chronic left parietal infarct unchanged. Small chronic infarct left head of caudate. Negative for acute infarct. Negative for acute hemorrhage or mass. No shift of the midline structures. Vascular: No hyperdense vessel or unexpected calcification. Skull: Negative for fracture. Other: Negative CT MAXILLOFACIAL FINDINGS Osseous: Negative for facial fracture. No fracture of the nasal bone or orbit. Orbits: Normal orbital contents. Sinuses: Mucosal edema in the paranasal sinuses, mild. No air-fluid level. Soft tissues: Soft tissue filling the right nares and nasal cavity, likely blood. Nasal septum deviated to the left but not fractured. CT CERVICAL SPINE FINDINGS Alignment: Normal Skull base and vertebrae: Negative for fracture Soft tissues and spinal canal: Atherosclerotic calcification of the carotid artery bilaterally. No soft tissue mass or adenopathy. Disc levels: Disc and facet degeneration throughout the cervical spine without significant stenosis. Lung apices clear Upper chest: Lung apices clear. Other: None IMPRESSION: Chronic ischemic change.  No acute intracranial abnormality Negative for facial fracture. Probable blood in the right nasal cavity and nares. Cervical degenerative changes.  Negative for fracture. Electronically Signed   By: Franchot Gallo M.D.   On: 06/13/2016 17:17    Procedures Procedures (including critical care time)  Medications Ordered in ED Medications  oxymetazoline (AFRIN) 0.05 % nasal spray 1 spray (1 spray Right Nare Given 06/13/16 1942)  acetaminophen (TYLENOL) tablet 650 mg (650 mg Oral Given 06/13/16 1925)     Initial Impression / Assessment and Plan / ED Course  I have reviewed the triage vital signs and the nursing notes.  Pertinent labs & imaging results that were available during my care of the patient were reviewed by me and considered in my medical decision making (see chart for details).  Clinical Course   Patient presents to the ED after a  mechanical fall with bilateral wrist pain, headache, with epistasis. Bleeding is controlled. No focal neuro deficits. Patient appears in no acute distress. Will order imaging of bilateral wrist and CAT scan of head and neck and maxillofacial.CAT scans of head, neck, face without any acute findings. No intracranial hemorrhage or fractures were noted. Patient was able to dislodge the clot from the right ear and Afrin was sprayed. Bleeding was controlled. Patient complained of a mild headache. Tylenol was given. No acute fractures of the wrist. Patient given the lateral wrist splints. Patient encouraged to not blow  her nose. She was encouraged to put ice on her lip and nose. I have given her referral to ENT if her symptoms persist. I encouraged her to follow up with her primary care doctor. Patient's vital signs are stable and she is in no acute distress at this time. She is able to ambulate in the ED. Pt is hemodynamically stable, in NAD, & able to ambulate in the ED. Pain has been managed & has no complaints prior to dc. Pt is comfortable with above plan and is stable for discharge at this time. All questions were answered prior to disposition. Strict return precautions for f/u to the ED were discussed. Patient was seen and examined by Dr. Tamera Punt who agrees with the above plan.   Bilateral wrist splints were placed by [employer]. Final Clinical Impressions(s) / ED Diagnoses   Final diagnoses:  Wrist pain, acute, left  Fall, initial encounter  Pain in both wrists  Epistaxis  Nose pain    New Prescriptions Discharge Medication List as of 06/13/2016  7:13 PM       Doristine Devoid, PA-C 06/13/16 2038    Malvin Johns, MD 06/13/16 2331    Doristine Devoid, PA-C 07/08/16 [place], MD 07/09/16 1330

## 2016-06-13 NOTE — Discharge Instructions (Signed)
Please continue to apply ice to nose and lip. Take Tylenol for pain. If you continue to have a nosebleed and-year-old pressure for 15 minutes in the nose continues to bleed please return to the ED. Please avoid blowing her nose. Follow up with her primary care doctor. He may follow-up with the ENT doctor if he continued to have symptoms. Return to ED if you have continued nosebleed, vision changes, worsening pain or for any reason. out of albuterol

## 2016-06-17 ENCOUNTER — Ambulatory Visit (INDEPENDENT_AMBULATORY_CARE_PROVIDER_SITE_OTHER): Payer: Medicare Other | Admitting: Orthopaedic Surgery

## 2016-07-01 DIAGNOSIS — J342 Deviated nasal septum: Secondary | ICD-10-CM | POA: Insufficient documentation

## 2016-07-01 DIAGNOSIS — J343 Hypertrophy of nasal turbinates: Secondary | ICD-10-CM | POA: Insufficient documentation

## 2016-07-01 DIAGNOSIS — S0992XA Unspecified injury of nose, initial encounter: Secondary | ICD-10-CM | POA: Insufficient documentation

## 2016-07-08 ENCOUNTER — Ambulatory Visit (INDEPENDENT_AMBULATORY_CARE_PROVIDER_SITE_OTHER): Payer: Medicare Other | Admitting: Orthopaedic Surgery

## 2016-07-09 DIAGNOSIS — I639 Cerebral infarction, unspecified: Secondary | ICD-10-CM

## 2016-07-09 HISTORY — DX: Cerebral infarction, unspecified: I63.9

## 2016-08-05 ENCOUNTER — Ambulatory Visit (INDEPENDENT_AMBULATORY_CARE_PROVIDER_SITE_OTHER): Payer: Medicare Other | Admitting: Physician Assistant

## 2016-08-05 ENCOUNTER — Ambulatory Visit (INDEPENDENT_AMBULATORY_CARE_PROVIDER_SITE_OTHER): Payer: Medicare Other

## 2016-08-05 DIAGNOSIS — M5441 Lumbago with sciatica, right side: Secondary | ICD-10-CM

## 2016-08-05 DIAGNOSIS — M5442 Lumbago with sciatica, left side: Secondary | ICD-10-CM

## 2016-08-05 NOTE — Progress Notes (Signed)
Office Visit Note   Patient: Roberta Bryant           Date of Birth: [birth year]           MRN: [id number] Visit Date: 08/05/2016              Requested by: Angelina Sheriff, MD [place], [place] [address] PCP: Angelina Sheriff., MD   Assessment & Plan: Visit Diagnoses:  1. Acute bilateral low back pain with bilateral sciatica     Plan: MRI of her lumbar spine to rule out HNP as the source of her weakness bilateral lower legs status post fall. She can continue with her physical therapy as planned for her low back gait and balance.  Follow-Up Instructions: Return in about 2 weeks (around 08/19/2016) for AFTER MRI.   Orders:  Orders Placed This Encounter  Procedures  . XR Lumbar Spine 2-3 Views  . MR Lumbar Spine w/o contrast   No orders of the defined types were placed in this encounter.     Procedures: No procedures performed   Clinical Data: No additional findings.   Subjective: No chief complaint on file.   HPI Roberta Bryant, Dr. Trevor Mace service comes into day for new complaint of bilateral lower leg weakness. Patient has known history of Parkinson's disease and is unsure if the fall or if it is her Parkinson's disease that is affecting her bilateral legs causing weakness in them to feel heavy. She states on 06/13/2016 she fell on a hardwood floor when she fell over a dust pain. She went to the ER due to a nasal injury and generalized extremity pain. Since that injury she feels like she can barely walk legs are feeling heavy. She does become short winded and very tired quickly.'s medical history is pertinent for end-stage renal disease on dialysis, CHF, h/o MI and diabetes. She states she's had arthritis in December was treated for this. Currently she is unable to stand for any prolonged period of time due to the back pain and leg weakness. She saw her primary care physician who she states really set her up for physical therapy for gait balance  strengthening lower legs. She has a history of low back pain and actually has undergone facet injections at on the left at L4-5 and L5-S1 due to moderate to severe facet joint arthritis at L4-L5 and L5-S1. Review of Systems Denies chest pain , fevers or chills. Positive SOB   Objective: Vital Signs: There were no vitals taken for this visit.  Physical Exam  Constitutional: She appears well-developed and well-nourished.  Skin: She is not diaphoretic.    Ortho Exam Lower extremities 5 out of 5 strength throughout the lower extremities. Against resistance. Dorsal pedal pulses are present bilaterally. Sensation grossly intact bilateral feet. Negative straight leg raise bilaterally. Deep tendon reflexes were 1+ at the knees and ankles and equal and symmetric. Dorsal pedal pulses present bilaterally. Good Range of motion of both hips without pain. Specialty Comments:  No specialty comments available.  Imaging: Xr Lumbar Spine 2-3 Views  Result Date: 08/05/2016 AP lumbar and lateral view: No acute fractures. Disc space overall well maintained. Grade 1 spondylolisthesis   L4 on L5. Facet arthritic changes at L4 for L5 and L5-S1. Otherwise no bony abnormalities.    PMFS History: Patient Active Problem List   Diagnosis Date Noted  . Chest pain 11/23/2015  . ESRD on dialysis ([place])   . Type 2 diabetes mellitus with  complication ([place])   . Essential hypertension   . Abnormal ECG   . Pain in the chest   . Hypotension 10/30/2014  . Abnormal EKG 10/30/2014  . 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 End stage renal disease ([place]) 07/18/2012  . Other complications due to renal dialysis device, implant, and graft 07/18/2012   Past Medical History:  Diagnosis Date  . Anemia   . CHF (congestive heart failure) ([place])   . Childhood asthma   . Chronic upper back pain   . ESRD (end stage renal disease) on dialysis ([place])    "TTS; [facility] in [place]" (10/30/2014)  . GERD  (gastroesophageal reflux disease)   . Headache    "a few times/week" (10/30/2014)  . History of blood transfusion 1996   "related to menses"  . Hyperlipidemia   . Hypertension   . Hypothyroidism   . IBS (irritable bowel syndrome)   . Myocardial infarction 2004  . Osteoarthritis   . Type II diabetes mellitus ([place])     No family history on file.  Past Surgical History:  Procedure Laterality Date  . APPENDECTOMY    . AV FISTULA PLACEMENT Left 12/03/2006   "forearm"  . AV FISTULA REPAIR Left 11/25/2009   "forearm"  . CHOLECYSTECTOMY OPEN  1970's  . HERNIA REPAIR    . INSERTION OF DIALYSIS CATHETER Right 2010   "chest"  . PARATHYROIDECTOMY  2010?   Parathyroid autotransplantation   . REVISON OF ARTERIOVENOUS FISTULA Left 12/04/2013   Procedure: REVISON OF ARTERIOVENOUS FISTULA;  Surgeon: Rosetta Posner, MD;  Location: [facility];  Service: Vascular;  Laterality: Left;  . TUBAL LIGATION  1989  . UMBILICAL HERNIA REPAIR  1990's X 2   Social History   Occupational History  . Not on file.   Social History Main Topics  . Smoking status: Former Smoker    Years: 3.00    Types: Cigarettes  . Smokeless tobacco: Never Used     Comment: "stopped smoking in the 1970's"  . Alcohol use No  . Drug use: Yes    Frequency: 7.0 times per week    Types: Marijuana     Comment: 10/30/2014 "maybe once/wk"  . Sexual activity: Not Currently

## 2016-08-13 ENCOUNTER — Emergency Department (HOSPITAL_COMMUNITY)
Admission: EM | Admit: 2016-08-13 | Discharge: 2016-08-13 | Disposition: A | Discharge status: Home / Self Care | Payer: Medicare Other | Attending: Emergency Medicine | Admitting: Emergency Medicine

## 2016-08-13 ENCOUNTER — Emergency Department (HOSPITAL_COMMUNITY): Payer: Medicare Other

## 2016-08-13 ENCOUNTER — Encounter (HOSPITAL_COMMUNITY): Payer: Self-pay | Admitting: Emergency Medicine

## 2016-08-13 DIAGNOSIS — E039 Hypothyroidism, unspecified: Secondary | ICD-10-CM | POA: Diagnosis not present

## 2016-08-13 DIAGNOSIS — R531 Weakness: Secondary | ICD-10-CM | POA: Insufficient documentation

## 2016-08-13 DIAGNOSIS — Z7982 Long term (current) use of aspirin: Secondary | ICD-10-CM | POA: Insufficient documentation

## 2016-08-13 DIAGNOSIS — E1140 Type 2 diabetes mellitus with diabetic neuropathy, unspecified: Secondary | ICD-10-CM | POA: Diagnosis not present

## 2016-08-13 DIAGNOSIS — G20 Parkinson's disease: Secondary | ICD-10-CM

## 2016-08-13 DIAGNOSIS — G629 Polyneuropathy, unspecified: Secondary | ICD-10-CM

## 2016-08-13 DIAGNOSIS — E1122 Type 2 diabetes mellitus with diabetic chronic kidney disease: Secondary | ICD-10-CM | POA: Insufficient documentation

## 2016-08-13 DIAGNOSIS — Z87891 Personal history of nicotine dependence: Secondary | ICD-10-CM | POA: Diagnosis not present

## 2016-08-13 DIAGNOSIS — Z992 Dependence on renal dialysis: Secondary | ICD-10-CM | POA: Diagnosis not present

## 2016-08-13 DIAGNOSIS — I132 Hypertensive heart and chronic kidney disease with heart failure and with stage 5 chronic kidney disease, or end stage renal disease: Secondary | ICD-10-CM | POA: Insufficient documentation

## 2016-08-13 DIAGNOSIS — Z79899 Other long term (current) drug therapy: Secondary | ICD-10-CM | POA: Diagnosis not present

## 2016-08-13 DIAGNOSIS — I509 Heart failure, unspecified: Secondary | ICD-10-CM | POA: Insufficient documentation

## 2016-08-13 DIAGNOSIS — N186 End stage renal disease: Secondary | ICD-10-CM | POA: Diagnosis not present

## 2016-08-13 LAB — URINALYSIS, ROUTINE W REFLEX MICROSCOPIC
Bacteria, UA: NONE SEEN
Bilirubin Urine: NEGATIVE
GLUCOSE, UA: NEGATIVE mg/dL
KETONES UR: NEGATIVE mg/dL
LEUKOCYTES UA: NEGATIVE
Nitrite: NEGATIVE
PH: 8 (ref 5.0–8.0)
Protein, ur: 100 mg/dL — AB
RBC / HPF: NONE SEEN RBC/hpf (ref 0–5)
Specific Gravity, Urine: 1.01 (ref 1.005–1.030)

## 2016-08-13 LAB — COMPREHENSIVE METABOLIC PANEL
ALK PHOS: 44 U/L (ref 38–126)
ALT: 5 U/L — ABNORMAL LOW (ref 14–54)
AST: 18 U/L (ref 15–41)
Albumin: 3.7 g/dL (ref 3.5–5.0)
Anion gap: 11 (ref 5–15)
BILIRUBIN TOTAL: 0.4 mg/dL (ref 0.3–1.2)
BUN: 57 mg/dL — AB (ref 6–20)
CALCIUM: 9.1 mg/dL (ref 8.9–10.3)
CO2: 24 mmol/L (ref 22–32)
CREATININE: 8.77 mg/dL — AB (ref 0.44–1.00)
Chloride: 103 mmol/L (ref 101–111)
GFR calc Af Amer: 5 mL/min — ABNORMAL LOW (ref >60)
GFR, EST NON AFRICAN AMERICAN: 4 mL/min — AB (ref >60)
Glucose, Bld: 111 mg/dL — ABNORMAL HIGH (ref 65–99)
Potassium: 4.7 mmol/L (ref 3.5–5.1)
Sodium: 138 mmol/L (ref 135–145)
TOTAL PROTEIN: 6.8 g/dL (ref 6.5–8.1)

## 2016-08-13 LAB — CBC WITH DIFFERENTIAL/PLATELET
BASOS ABS: 0 10*3/uL (ref 0.0–0.1)
BASOS PCT: 1 %
EOS ABS: 0.4 10*3/uL (ref 0.0–0.7)
EOS PCT: 7 %
HCT: 30.9 % — ABNORMAL LOW (ref 36.0–46.0)
Hemoglobin: 10 g/dL — ABNORMAL LOW (ref 12.0–15.0)
Lymphocytes Relative: 33 %
Lymphs Abs: 1.9 10*3/uL (ref 0.7–4.0)
MCH: 35.1 pg — ABNORMAL HIGH (ref 26.0–34.0)
MCHC: 32.4 g/dL (ref 30.0–36.0)
MCV: 108.4 fL — ABNORMAL HIGH (ref 78.0–100.0)
Monocytes Absolute: 0.6 10*3/uL (ref 0.1–1.0)
Monocytes Relative: 10 %
Neutro Abs: 2.8 10*3/uL (ref 1.7–7.7)
Neutrophils Relative %: 49 %
Platelets: 190 10*3/uL (ref 150–400)
RBC: 2.85 MIL/uL — AB (ref 3.87–5.11)
RDW: 14.9 % (ref 11.5–15.5)
WBC: 5.7 10*3/uL (ref 4.0–10.5)

## 2016-08-13 NOTE — ED Provider Notes (Signed)
[facility] DEPT Provider Note   CSN: [id number] Arrival date & time: 08/13/16  1413     History   Chief Complaint Chief Complaint  Patient presents with  . faliure to thrive    HPI Roberta Bryant is a 66 y.o. female.  Roberta Bryant is a 66 y.o. Female with a history of Parkinson's disease, end-stage renal disease on dialysis Tuesdays, Thursdays and Saturdays, and diabetes who presents to the ED complaining of worsening Parkinson's over the last 2-3 weeks. Patient is diagnosed with Parkinson's disease several years ago. She is followed by neurologist Dr. Metta Clines and Dr. Sabra Heck. She tells me over the past. 3 weeks she feels like her legs have not been functioning like they usually do. She reports several falls over the past 2-3 weeks. She fell 3 days ago getting out of her car onto her bottom. She denies hitting her head or loss of consciousness. She denies injury from this fall. She tells me she feels like her legs are heavy and she has decreased sensation in them. She lives at home with her two sons and has had increasing difficulty doing her housework. She has follow-up with neurology next month. She does not have home health. She last had dialysis Tuesday. She is due for dialysis today but did not go. She makes a small amount of urine. She denies any changes to her urine. She denies fevers, coughing, chest pain, shortness of breath, abdominal pain, nausea, vomiting, diarrhea or rashes.   The history is provided by the patient and medical records. No language interpreter was used.    Past Medical History:  Diagnosis Date  . Anemia   . CHF (congestive heart failure) ([place])   . Childhood asthma   . Chronic upper back pain   . ESRD (end stage renal disease) on dialysis ([place])    "TTS; [facility] in [place]" (10/30/2014)  . GERD (gastroesophageal reflux disease)   . Headache    "a few times/week" (10/30/2014)  . History of blood transfusion 1996   "related to menses"  .  Hyperlipidemia   . Hypertension   . Hypothyroidism   . IBS (irritable bowel syndrome)   . Myocardial infarction 2004  . Osteoarthritis   . Type II diabetes mellitus [facility])     Patient Active Problem List   Diagnosis Date Noted  . Chest pain 11/23/2015  . ESRD on dialysis ([place])   . Type 2 diabetes mellitus with complication ([place])   . Essential hypertension   . Abnormal ECG   . Pain in the chest   . Hypotension 10/30/2014  . Abnormal EKG 10/30/2014  . 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 End stage renal disease ([place]) 07/18/2012  . Other complications due to renal dialysis device, implant, and graft 07/18/2012    Past Surgical History:  Procedure Laterality Date  . APPENDECTOMY    . AV FISTULA PLACEMENT Left 12/03/2006   "forearm"  . AV FISTULA REPAIR Left 11/25/2009   "forearm"  . CHOLECYSTECTOMY OPEN  1970's  . HERNIA REPAIR    . INSERTION OF DIALYSIS CATHETER Right 2010   "chest"  . PARATHYROIDECTOMY  2010?   Parathyroid autotransplantation   . REVISON OF ARTERIOVENOUS FISTULA Left 12/04/2013   Procedure: REVISON OF ARTERIOVENOUS FISTULA;  Surgeon: Rosetta Posner, MD;  Location: [facility];  Service: Vascular;  Laterality: Left;  . TUBAL LIGATION  1989  . UMBILICAL HERNIA REPAIR  1990's X 2    OB History  No data available       Home Medications    Prior to Admission medications   Medication Sig Start Date End Date Taking? Authorizing Provider  acetaminophen (TYLENOL) 325 MG tablet Take 650 mg by mouth every 6 (six) hours as needed for moderate pain or headache.     Historical Provider, MD  aspirin EC 81 MG tablet Take 81 mg by mouth every morning.    Historical Provider, MD  carbidopa-levodopa (SINEMET) 25-100 MG per tablet Take 1 tablet by mouth 3 (three) times daily.  02/21/14 05/23/16  Historical Provider, MD  carvedilol (COREG) 12.5 MG tablet Take 25 mg by mouth at bedtime.     Historical Provider, MD  diclofenac sodium (VOLTAREN) 1 %  GEL Apply 4 g topically 4 (four) times daily as needed (pain).     Historical Provider, MD  docusate sodium (COLACE) 100 MG capsule Take 100 mg by mouth 2 (two) times daily.    Historical Provider, MD  ezetimibe (ZETIA) 10 MG tablet Take 10 mg by mouth daily. 05/10/16   Historical Provider, MD  fenofibrate (TRICOR) 145 MG tablet Take 145 mg by mouth daily. 05/15/16   Historical Provider, MD  fenofibrate 120 MG TABS Take 1 tablet (120 mg total) by mouth daily. Patient not taking: Reported on 05/23/2016 11/24/15   Vivi Barrack, MD  ferric citrate (AURYXIA) 1 GM 210 MG(Fe) tablet Take 420 mg by mouth 3 (three) times daily with meals.    Historical Provider, MD  finasteride (PROSCAR) 5 MG tablet Take 2.5 mg by mouth daily.    Historical Provider, MD  HYDROcodone-acetaminophen (NORCO/VICODIN) 5-325 MG tablet Take 1 tablet by mouth every 6 (six) hours as needed for moderate pain.    Historical Provider, MD  HYDROcodone-acetaminophen (NORCO/VICODIN) 5-325 MG tablet Take 1 tablet by mouth every 4 (four) hours as needed. Patient not taking: Reported on 05/23/2016 02/27/16   Alfonzo Beers, MD  levothyroxine (SYNTHROID, LEVOTHROID) 50 MCG tablet Take 50 mcg by mouth daily before breakfast.     Historical Provider, MD  loperamide (IMODIUM A-D) 2 MG tablet Take 2 mg by mouth 4 (four) times daily as needed for diarrhea or loose stools.    Historical Provider, MD  meclizine (ANTIVERT) 25 MG tablet Take 12.5-25 mg by mouth 3 (three) times daily as needed for dizziness.     Historical Provider, MD  methocarbamol (ROBAXIN) 500 MG tablet Take 1 tablet (500 mg total) by mouth 2 (two) times daily. Patient not taking: Reported on 05/23/2016 02/27/16   Alfonzo Beers, MD  multivitamin (RENA-VIT) TABS tablet Take 1 tablet by mouth daily.     Historical Provider, MD  omeprazole (PRILOSEC) 20 MG capsule Take 20 mg by mouth 2 (two) times daily before a meal.    Historical Provider, MD  predniSONE (DELTASONE) 20 MG tablet Take 2  tablets (40 mg total) by mouth daily. 05/23/16   Jola Schmidt, MD  prochlorperazine (COMPAZINE) 5 MG tablet Take 5 mg by mouth every 6 (six) hours as needed for nausea or vomiting (for dialysis).    Historical Provider, MD  sitaGLIPtin (JANUVIA) 25 MG tablet Take 25 mg by mouth daily.    Historical Provider, MD  ticagrelor (BRILINTA) 90 MG TABS tablet Take 90 mg by mouth 2 (two) times daily.    Historical Provider, MD  tizanidine (ZANAFLEX) 2 MG capsule Take 2 mg by mouth 3 (three) times daily as needed for muscle spasms.    Historical Provider, MD  Vitamin D, Ergocalciferol, (  DRISDOL) 50000 units CAPS capsule Take 50,000 Units by mouth every Sunday.    Historical Provider, MD  Zinc 50 MG CAPS Take 50 mg by mouth every morning.     Historical Provider, MD    Family History No family history on file.  Social History Social History  Substance Use Topics  . Smoking status: Former Smoker    Years: 3.00    Types: Cigarettes  . Smokeless tobacco: Never Used     Comment: "stopped smoking in the 1970's"  . Alcohol use No     Allergies   Crestor [rosuvastatin calcium]; Sulfa antibiotics; Amoxicillin; Chlorphen-phenyleph-asa; Ibuprofen; and Nalde[place] senior [guaifenesin]   Review of Systems Review of Systems  Constitutional: Negative for chills and fever.  HENT: Negative for congestion and sore throat.   Eyes: Negative for pain and visual disturbance.  Respiratory: Negative for cough and shortness of breath.   Cardiovascular: Negative for chest pain.  Gastrointestinal: Negative for abdominal pain, diarrhea, nausea and vomiting.  Genitourinary: Negative for dysuria.  Musculoskeletal: Negative for back pain and neck pain.  Skin: Negative for rash.  Neurological: Positive for weakness and numbness. Negative for dizziness, syncope, light-headedness and headaches.     Physical Exam Updated Vital Signs BP 180/75 (BP Location: Right Arm)   Pulse 66   Temp 97.5 F (36.4 C) (Oral)   Resp  16   SpO2 98%   Physical Exam  Constitutional: She is oriented to person, place, and time. She appears well-developed and well-nourished. No distress.  Nontoxic appearing. Obese female.  HENT:  Head: Normocephalic and atraumatic.  Right Ear: External ear normal.  Left Ear: External ear normal.  Mouth/Throat: Oropharynx is clear and moist.  Eyes: Conjunctivae and EOM are normal. Pupils are equal, round, and reactive to light. Right eye exhibits no discharge. Left eye exhibits no discharge.  Neck: Normal range of motion. Neck supple.  Cardiovascular: Normal rate, regular rhythm, normal heart sounds and intact distal pulses.  Exam reveals no gallop and no friction rub.   No murmur heard. Bilateral radial, posterior tibialis and dorsalis pedis pulses are intact.    Pulmonary/Chest: Effort normal and breath sounds normal. No stridor. No respiratory distress. She has no wheezes. She has no rales.  Lungs clear to auscultation bilaterally. No increased work of breathing. No rales or rhonchi.  Abdominal: Soft. She exhibits no mass. There is no tenderness. There is no guarding.  Musculoskeletal: She exhibits no edema.  Lymphadenopathy:    She has no cervical adenopathy.  Neurological: She is alert and oriented to person, place, and time. No cranial nerve deficit. Coordination normal.  Patient has decreased strength to her bilateral lower extremities. She has good strength to her bilateral upper extremities. Good grip strengths bilaterally. Is intact in her bilateral lower extremities, however patient reports this is decreased. Cranial nerves are intact. Speech is clear and coherent. No pronator drift. Finger-to-nose is intact bilaterally.  Skin: Skin is warm and dry. Capillary refill takes less than 2 seconds. No rash noted. She is not diaphoretic. No erythema. No pallor.  Psychiatric: She has a normal mood and affect. Her behavior is normal.  Nursing note and vitals reviewed.    ED Treatments /  Results  Labs (all labs ordered are listed, but only abnormal results are displayed) Labs Reviewed  COMPREHENSIVE METABOLIC PANEL - Abnormal; Notable for the following:       Result Value   Glucose, Bld 111 (*)    BUN 57 (*)  Creatinine, Ser 8.77 (*)    ALT 5 (*)    GFR calc non Af Amer 4 (*)    GFR calc Af Amer 5 (*)    All other components within normal limits  CBC WITH DIFFERENTIAL/PLATELET - Abnormal; Notable for the following:    RBC 2.85 (*)    Hemoglobin 10.0 (*)    HCT 30.9 (*)    MCV 108.4 (*)    MCH 35.1 (*)    All other components within normal limits  URINALYSIS, ROUTINE W REFLEX MICROSCOPIC - Abnormal; Notable for the following:    Hgb urine dipstick SMALL (*)    Protein, ur 100 (*)    Squamous Epithelial / LPF 0-5 (*)    All other components within normal limits    EKG  EKG Interpretation  Date/Time:  Thursday August 13 2016 19:03:07 EST Ventricular Rate:  69 PR Interval:    QRS Duration: 89 QT Interval:  413 QTC Calculation: 443 R Axis:   8 Text Interpretation:  Sinus rhythm Abnormal T, consider ischemia, lateral leads Baseline wander in lead(s) V4 V5 No significant change since last tracing Confirmed by ISAACS MD, Lysbeth Galas [phone]) on 08/13/2016 7:13:36 PM       Radiology Dg Chest 2 View  Result Date: 08/13/2016 CLINICAL DATA:  Patient reprots having trouble with her parkinson's, reports she has trouble standing. Reports being diagnosed with pneumonia X 4 weeks ago, she has finished her antibiotics and reports she feels better. HX pneumonia, CHF, HTN, diabetes EXAM: CHEST - 2 VIEW COMPARISON:  07/22/2016 FINDINGS: Somewhat coarse bronchovascular markings, with no focal airspace disease or overt edema. Heart size upper limits normal. Atheromatous aorta. Surgical clips at the thoracic inlet on the left. No effusion. Visualized bones unremarkable.   Surgical clips right upper abdomen. IMPRESSION: 1. Chronic appearing interstitial changes without definite acute or  superimposed abnormality. 2. Borderline cardiomegaly Electronically Signed   By: Lucrezia Europe M.D.   On: 08/13/2016 18:27   Ct Head Wo Contrast  Result Date: 08/13/2016 CLINICAL DATA:  Pt states she is here due to her parkinson's dx getting worse Multiple falls and bruises and now is unable to feel her legs below her knees No hx of stroke EXAM: CT HEAD WITHOUT CONTRAST TECHNIQUE: Contiguous axial images were obtained from the base of the skull through the vertex without intravenous contrast. COMPARISON:  06/13/2016 FINDINGS: Brain: Diffuse parenchymal atrophy. Patchy areas of hypoattenuation in deep and periventricular white matter bilaterally. Negative for acute intracranial hemorrhage, mass lesion, acute infarction, midline shift, or mass-effect. Acute infarct may be inapparent on noncontrast CT. Ventricles and sulci symmetric. Vascular: Atherosclerotic and physiologic intracranial calcifications. Skull: Bone windows demonstrate no focal lesion. Sinuses/Orbits: Layering debris in left maxillary and sphenoid sinuses. Postop changes in the right mastoid air cells as before. Other: None. IMPRESSION: 1. Negative for bleed or other acute intracranial process. 2. Atrophy and nonspecific white matter changes. Electronically Signed   By: Lucrezia Europe M.D.   On: 08/13/2016 18:59    Procedures Procedures (including critical care time)  Medications Ordered in ED Medications - No data to display   Initial Impression / Assessment and Plan / ED Course  I have reviewed the triage vital signs and the nursing notes.  Pertinent labs & imaging results that were available during my care of the patient were reviewed by me and considered in my medical decision making (see chart for details).    This is a 66 y.o. Female with a history of  Parkinson's disease, end-stage renal disease on dialysis Tuesdays, Thursdays and Saturdays, and diabetes who presents to the ED complaining of worsening Parkinson's over the last 2-3  weeks. Patient is diagnosed with Parkinson's disease several years ago. She is followed by neurologist Dr. Metta Clines and Dr. Sabra Heck. She tells me over the past. 3 weeks she feels like her legs have not been functioning like they usually do. She reports several falls over the past 2-3 weeks. She fell 3 days ago getting out of her car onto her bottom. She denies hitting her head or loss of consciousness. She denies injury from this fall. She tells me she feels like her legs are heavy and she has decreased sensation in them. She lives at home with her two sons and has had increasing difficulty doing her housework. She has follow-up with neurology next month. She does not have home health. She last had dialysis Tuesday. She is due for dialysis today but did not go. She makes a small amount of urine. She denies any changes to her urine.  On exam the patient is afebrile nontoxic appearing. Lungs are clear to auscultation bilaterally. Her abdomen is soft nontender to palpation. She has some difficulty with manipulation of her bilateral lower legs. Her sensation is intact to her bilateral lower legs, however the patient reports decreased sensation. She has no other focal neurological deficits. She is followed by a neurologist in [place]. She reports having follow-up in 3 weeks. She denies any changes to her medications or new over-the-counter medications recently. Urinalysis is without sign of infection. CMP is consistent with the patient in end-stage renal disease. Normal sodium, potassium and bicarbonate. CBC shows no leukocytosis. Hemoglobin is around her baseline. Head CT without contrast showed no acute intracranial process. Chest x-ray shows mild cardiomegaly. No pneumonia. I consulted with case management who came by to see the patient. They will help her get set up with home health. Patient lives with her two sons who help her at home as well. We'll discharge at this time and have her follow up closely with her  primary care doctor, and neurology. I see no need for additional emergent workup at this time. I discussed strict and specific return precautions. I advised the patient to follow-up with their primary care provider this week. I advised the patient to return to the emergency department with new or worsening symptoms or new concerns. The patient verbalized understanding and agreement with plan.    This patient was discussed with and evaluated by Dr. Ellender Hose who agrees with assessment and plan.   Final Clinical Impressions(s) / ED Diagnoses   Final diagnoses:  Parkinson's disease ([place])  ESRD on dialysis ([place])  Weakness generalized  Neuropathy ([place])    New Prescriptions New Prescriptions   No medications on file     Waynetta Pean, PA-C 08/13/16 2017    Duffy Bruce, MD 08/14/16 [phone]

## 2016-08-13 NOTE — ED Notes (Signed)
Patient transported to CT 

## 2016-08-13 NOTE — ED Notes (Signed)
Pt returned from CT °

## 2016-08-13 NOTE — ED Notes (Addendum)
Pt states she is here for worsening of her parkinson's disease.

## 2016-08-13 NOTE — ED Triage Notes (Signed)
Pt reports history of Parkinson diesese and states she is getting "progressivly worse". Pt states she is been having trouble walking for the past month with frequent falls at home. Pt states she loves with her two sons and states she is unable to do ADLs alone.

## 2016-08-19 ENCOUNTER — Ambulatory Visit (INDEPENDENT_AMBULATORY_CARE_PROVIDER_SITE_OTHER): Payer: Medicare Other | Admitting: Physician Assistant

## 2016-08-19 ENCOUNTER — Ambulatory Visit
Admission: RE | Admit: 2016-08-19 | Discharge: 2016-08-19 | Disposition: A | Discharge status: Home / Self Care | Payer: Medicare Other | Source: Ambulatory Visit | Attending: Physician Assistant | Admitting: Physician Assistant

## 2016-08-19 DIAGNOSIS — M5441 Lumbago with sciatica, right side: Secondary | ICD-10-CM

## 2016-08-19 DIAGNOSIS — M5442 Lumbago with sciatica, left side: Principal | ICD-10-CM

## 2016-08-20 ENCOUNTER — Ambulatory Visit (INDEPENDENT_AMBULATORY_CARE_PROVIDER_SITE_OTHER): Payer: Medicare Other | Admitting: Physician Assistant

## 2016-08-24 ENCOUNTER — Ambulatory Visit (INDEPENDENT_AMBULATORY_CARE_PROVIDER_SITE_OTHER): Payer: Medicare Other | Admitting: Physician Assistant

## 2016-08-24 ENCOUNTER — Other Ambulatory Visit (INDEPENDENT_AMBULATORY_CARE_PROVIDER_SITE_OTHER): Payer: Self-pay

## 2016-08-24 DIAGNOSIS — M5441 Lumbago with sciatica, right side: Secondary | ICD-10-CM | POA: Diagnosis not present

## 2016-08-24 DIAGNOSIS — M5442 Lumbago with sciatica, left side: Principal | ICD-10-CM

## 2016-08-24 DIAGNOSIS — G8929 Other chronic pain: Secondary | ICD-10-CM

## 2016-08-24 NOTE — Progress Notes (Signed)
Office Visit Note   Patient: Roberta Bryant           Date of Birth: 11-Mar-1951           MRN: [id number] Visit Date: 08/24/2016              Requested by: Angelina Sheriff, MD [place], [place] [id number] PCP: Angelina Sheriff., MD   Assessment & Plan: Visit Diagnoses:  1. Chronic bilateral low back pain with bilateral sciatica     Plan: We'll send her for facet injections lumbar spine with Dr. Ernestina Patches. See her back in 4 weeks' check her progress lack of. She is seen and Neurologist on Friday for Parkinson's maybe they can help manage some of the weakness she is having in her legs.  Follow-Up Instructions: No Follow-up on file.   Orders:  No orders of the defined types were placed in this encounter.  No orders of the defined types were placed in this encounter.     Procedures: No procedures performed   Clinical Data: No additional findings.   Subjective: Chief Complaint  Patient presents with  . Lower Back - Follow-up    Patient here today to go over the results of her MRI scan. Reports that she continues to have some low back pain. She is having numbness from the knees down the both ankles but worse on the right. She's had epidural steroid injections in the past for refer facet arthritis and states that this relieved most of the pain she was having in her legs and back at that time.  RI lumbar spine dated 08/19/2016 showed no significant change from previous MRI of the lumbar spine. Moderate severe bilateral facet arthritic changes at L4-5 without focal nerve encroachment. Moderate facet arthritis L5-S1 bilaterally. The MRI results are  reviewed with patient today.  Review of Systems   Objective: Vital Signs: There were no vitals taken for this visit.  Physical Exam  Ortho Exam Straight leg raise bilaterally. Tenderness right lower paraspinous region the lumbar spine. Specialty Comments:  No specialty comments available.  Imaging: No  results found.   PMFS History: Patient Active Problem List   Diagnosis Date Noted  . Chest pain 11/23/2015  . ESRD on dialysis ([facility])   . Type 2 diabetes mellitus with complication ([facility])   . Essential hypertension   . Abnormal ECG   . Pain in the chest   . Hypotension 10/30/2014  . Abnormal EKG 10/30/2014  . 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 End stage renal disease ([facility]) 07/18/2012  . Other complications due to renal dialysis device, implant, and graft 07/18/2012   Past Medical History:  Diagnosis Date  . Anemia   . CHF (congestive heart failure) ([facility])   . Childhood asthma   . Chronic upper back pain   . ESRD (end stage renal disease) on dialysis ([facility])    "TTS; [facility] in [place]" (10/30/2014)  . GERD (gastroesophageal reflux disease)   . Headache    "a few times/week" (10/30/2014)  . History of blood transfusion 1996   "related to menses"  . Hyperlipidemia   . Hypertension   . Hypothyroidism   . IBS (irritable bowel syndrome)   . Myocardial infarction 2004  . Osteoarthritis   . Type II diabetes mellitus ([facility])     No family history on file.  Past Surgical History:  Procedure Laterality Date  . APPENDECTOMY    . AV FISTULA PLACEMENT Left  12/03/2006   "forearm"  . AV FISTULA REPAIR Left 11/25/2009   "forearm"  . CHOLECYSTECTOMY OPEN  1970's  . HERNIA REPAIR    . INSERTION OF DIALYSIS CATHETER Right 2010   "chest"  . PARATHYROIDECTOMY  2010?   Parathyroid autotransplantation   . REVISON OF ARTERIOVENOUS FISTULA Left 12/04/2013   Procedure: REVISON OF ARTERIOVENOUS FISTULA;  Surgeon: Rosetta Posner, MD;  Location: [facility];  Service: Vascular;  Laterality: Left;  . TUBAL LIGATION  1989  . UMBILICAL HERNIA REPAIR  1990's X 2   Social History   Occupational History  . Not on file.   Social History Main Topics  . Smoking status: Former Smoker    Years: 3.00    Types: Cigarettes  . Smokeless tobacco: Never Used     Comment: "stopped  smoking in the 1970's"  . Alcohol use No  . Drug use: Yes    Frequency: 7.0 times per week    Types: Marijuana     Comment: 10/30/2014 "maybe once/wk"  . Sexual activity: Not Currently

## 2016-08-28 ENCOUNTER — Ambulatory Visit (INDEPENDENT_AMBULATORY_CARE_PROVIDER_SITE_OTHER): Payer: Medicare Other | Admitting: Neurology

## 2016-08-28 ENCOUNTER — Telehealth: Payer: Self-pay | Admitting: Neurology

## 2016-08-28 ENCOUNTER — Encounter: Payer: Self-pay | Admitting: Neurology

## 2016-08-28 VITALS — BP 161/84 | HR 60 | Ht 59.0 in | Wt 207.0 lb

## 2016-08-28 DIAGNOSIS — R0989 Other specified symptoms and signs involving the circulatory and respiratory systems: Secondary | ICD-10-CM

## 2016-08-28 DIAGNOSIS — R269 Unspecified abnormalities of gait and mobility: Secondary | ICD-10-CM | POA: Diagnosis not present

## 2016-08-28 HISTORY — DX: Unspecified abnormalities of gait and mobility: R26.9

## 2016-08-28 NOTE — Telephone Encounter (Signed)
Roberta Bryant! Pt needs to schedule carotid bilateral.

## 2016-08-28 NOTE — Patient Instructions (Signed)
We will get MRI of the brain and get a carotid doppler study. 

## 2016-08-28 NOTE — Progress Notes (Signed)
Reason for visit: Parkinson's disease  Referring physician: [place]  Roberta Bryant is a 66 y.o. female  History of present illness:  Ms. Gwynne is a 66 year old right-handed Hispanic female who presented in 2015 with a left sided Bell's palsy. The patient subsequently was also diagnosed with Parkinson's disease at that time by Dr. Metta Clines and she was placed on Sinemet. The patient has been on Sinemet for 3 years. She has had a chronic gait disorder, she has been using a cane for ambulation. She has diabetes and end-stage renal disease on hemodialysis. The patient indicates that she has had an alteration in her ability to ambulate that began in January 2018. She has had some right-sided numbness of the arm and the leg over the last year, but this has worsened, and she feels as if the right leg is weak and will buckle on her. The patient has had to use a walker for ambulation, but she still falls on a regular basis. The patient has been to the hospital and a CT of the head and cervical spine were done, these were reviewed on line. The CT of the head does show a moderate level of small vessel ischemic changes. The CT of the cervical spine did not show any obvious spinal stenosis. The patient has had MRI of the lumbar spine that showed some degenerative changes, without evidence of nerve root impingement or spinal stenosis. The patient reports neck pain, and discomfort down into the right upper extremity. Again she has numbness of the right arm and the right leg. She reports some occasional dizziness. She has reported a tremor on the right leg at times. Occasionally, she may have some slurring of speech, she denies any memory problems. She denies problems with swallowing. She does have some occasional urinary urgency and incontinence. She has been set up for physical therapy, but some days she does not feel like getting out of bed and she has canceled the therapy. The patient is sent to this  office for further evaluation.  Past Medical History:  Diagnosis Date  . Anemia   . CHF (congestive heart failure) ([facility])   . Childhood asthma   . Chronic upper back pain   . ESRD (end stage renal disease) on dialysis ([facility])    "TTS; [facility] in [place]" (10/30/2014)  . Gait abnormality 08/28/2016  . GERD (gastroesophageal reflux disease)   . Headache    "a few times/week" (10/30/2014)  . History of blood transfusion 1996   "related to menses"  . Hyperlipidemia   . Hypertension   . Hypothyroidism   . IBS (irritable bowel syndrome)   . Myocardial infarction 2004  . Osteoarthritis   . Type II diabetes mellitus ([facility])     Past Surgical History:  Procedure Laterality Date  . APPENDECTOMY    . AV FISTULA PLACEMENT Left 12/03/2006   "forearm"  . AV FISTULA REPAIR Left 11/25/2009   "forearm"  . CHOLECYSTECTOMY OPEN  1970's  . HERNIA REPAIR    . INSERTION OF DIALYSIS CATHETER Right 2010   "chest"  . PARATHYROIDECTOMY  2010?   Parathyroid autotransplantation   . REVISON OF ARTERIOVENOUS FISTULA Left 12/04/2013   Procedure: REVISON OF ARTERIOVENOUS FISTULA;  Surgeon: Rosetta Posner, MD;  Location: [facility];  Service: Vascular;  Laterality: Left;  . TUBAL LIGATION  1989  . UMBILICAL HERNIA REPAIR  1990's X 2    Family History  Problem Relation Age of Onset  . Epilepsy Mother   .  Diabetes Mother   . Cancer Mother   . Alcohol abuse Father     Social history:  reports that she has quit smoking. Her smoking use included Cigarettes. She quit after 3.00 years of use. She has never used smokeless tobacco. She reports that she uses drugs, including Marijuana, about 7 times per week. She reports that she does not drink alcohol.  Medications:  Prior to Admission medications   Medication Sig Start Date End Date Taking? Authorizing Provider  acetaminophen (TYLENOL) 325 MG tablet Take 650 mg by mouth every 6 (six) hours as needed for moderate pain or headache.    Yes Historical Provider, MD    aspirin EC 81 MG tablet Take 81 mg by mouth every morning.   Yes Historical Provider, MD  docusate sodium (COLACE) 100 MG capsule Take 100 mg by mouth 2 (two) times daily.   Yes Historical Provider, MD  ezetimibe (ZETIA) 10 MG tablet Take 10 mg by mouth daily. 05/10/16  Yes Historical Provider, MD  fenofibrate (TRICOR) 145 MG tablet Take 145 mg by mouth daily. 05/15/16  Yes Historical Provider, MD  fenofibrate 120 MG TABS Take 1 tablet (120 mg total) by mouth daily. 11/24/15  Yes Vivi Barrack, MD  finasteride (PROSCAR) 5 MG tablet Take 2.5 mg by mouth daily.   Yes Historical Provider, MD  levothyroxine (SYNTHROID, LEVOTHROID) 50 MCG tablet Take 50 mcg by mouth daily before breakfast.    Yes Historical Provider, MD  loperamide (IMODIUM A-D) 2 MG tablet Take 2 mg by mouth 4 (four) times daily as needed for diarrhea or loose stools.   Yes Historical Provider, MD  meclizine (ANTIVERT) 25 MG tablet Take 12.5-25 mg by mouth 3 (three) times daily as needed for dizziness.    Yes Historical Provider, MD  methocarbamol (ROBAXIN) 500 MG tablet Take 1 tablet (500 mg total) by mouth 2 (two) times daily. 02/27/16  Yes Alfonzo Beers, MD  multivitamin (RENA-VIT) TABS tablet Take 1 tablet by mouth daily.    Yes Historical Provider, MD  omeprazole (PRILOSEC) 20 MG capsule Take 20 mg by mouth 2 (two) times daily before a meal.   Yes Historical Provider, MD  prochlorperazine (COMPAZINE) 5 MG tablet Take 5 mg by mouth every 6 (six) hours as needed for nausea or vomiting (for dialysis).   Yes Historical Provider, MD  sitaGLIPtin (JANUVIA) 25 MG tablet Take 25 mg by mouth daily.   Yes Historical Provider, MD  ticagrelor (BRILINTA) 90 MG TABS tablet Take 90 mg by mouth 2 (two) times daily.   Yes Historical Provider, MD  tizanidine (ZANAFLEX) 2 MG capsule Take 2 mg by mouth 3 (three) times daily as needed for muscle spasms.   Yes Historical Provider, MD  Vitamin D, Ergocalciferol, (DRISDOL) 50000 units CAPS capsule Take  50,000 Units by mouth every Sunday.   Yes Historical Provider, MD  Zinc 50 MG CAPS Take 50 mg by mouth every morning.    Yes Historical Provider, MD  carbidopa-levodopa (SINEMET) 25-100 MG per tablet Take 1 tablet by mouth 3 (three) times daily.  02/21/14 05/23/16  Historical Provider, MD      Allergies  Allergen Reactions  . Crestor [Rosuvastatin Calcium] Other (See Comments)    Dizziness, couldn't walk  . Sulfa Antibiotics Anaphylaxis  . Amoxicillin Hives  . Chlorphen-Phenyleph-Asa Rash  . Ibuprofen Hives  . Naldecon Senior [Guaifenesin] Hives    ROS:  Out of a complete 14 system review of symptoms, the patient complains only of the following  symptoms, and all other reviewed systems are negative.  Headache, numbness, weakness, dizziness, tremor Gait disorder  Blood pressure (!) 161/84, pulse 60, height 4\' 11"  (1.499 m), weight 207 lb (93.9 kg).  Physical Exam  General: The patient is alert and cooperative at the time of the examination. The patient is markedly obese.  Eyes: Pupils are equal, round, and reactive to light. Discs are flat bilaterally.  Neck: The neck is supple, a carotid bruit on the left is noted.  Respiratory: The respiratory examination is clear.  Cardiovascular: The cardiovascular examination reveals a regular rate and rhythm, a grade III/VI systolic ejection murmur is noted in the aortic area.  Skin: Extremities are with 1+ edema below the knees bilaterally.  Neurologic Exam  Mental status: The patient is alert and oriented x 3 at the time of the examination. The patient has apparent normal recent and remote memory, with an apparently normal attention span and concentration ability.  Cranial nerves: Facial symmetry is present. There is good sensation of the face to pinprick and soft touch bilaterally. The strength of the facial muscles and the muscles to head turning and shoulder shrug are normal bilaterally. Speech is well enunciated, no aphasia or  dysarthria is noted. Extraocular movements are full. Visual fields are full. The tongue is midline, and the patient has symmetric elevation of the soft palate. No obvious hearing deficits are noted. The patient appears to have normal animation of the face, normal rate of eye blink. A jaw tremor was seen.  Motor: The motor testing reveals 5 over 5 strength of all 4 extremities. Good symmetric motor tone is noted throughout.  Sensory: Sensory testing is intact to pinprick, soft touch, vibration sensation, and position sense on the left extremities. The patient reports decreased pinprick sensation, vibration sensation and position sensation on the right arm and leg as compared to the left. No evidence of extinction is noted.  Coordination: Cerebellar testing reveals good finger-nose-finger and heel-to-shin bilaterally.  Gait and station: The patient was able to ambulate with minimal assistance, she takes good stride, no shuffling or freezing was seen. Tandem gait was not attempted. Romberg is negative. No drift is seen.  Reflexes: Deep tendon reflexes are symmetric, but were depressed bilaterally. Toes are downgoing bilaterally.   Assessment/Plan:  1. Reported history of Parkinson's disease  2. Gait disorder, worsening  3. Left carotid bruit  4. Neck pain, right arm pain  The patient has a reported history of Parkinson's disease but the current clinical examination shows no features whatsoever of Parkinson's disease. The patient apparently is on Sinemet 25/100 mg tablet taking one 4 times daily. The patient may be tapered off of this medication in the future, occasionally the Sinemet is so effective at masking symptoms of Parkinson's disease that it is not apparent while on the medication. The etiology of the gait disorder is not clear, the patient does appear to have a right sided sensory deficit, we will need to evaluate for cerebrovascular disease as an etiology. MRI of the brain will be done.  The patient has a left carotid bruit, a carotid Doppler will be done. The carotid bruit may be related to a radiation of a heart murmur. The patient will get back into physical therapy, she will follow-up in 3 or 4 months. We may consider MRI of the cervical spine in the future.  Jill Alexanders MD 08/28/2016 10:16 AM  [facility] [address] [place], [place] [address]  Phone [phone] Fax [phone]

## 2016-08-30 ENCOUNTER — Other Ambulatory Visit (INDEPENDENT_AMBULATORY_CARE_PROVIDER_SITE_OTHER): Payer: Self-pay | Admitting: Physician Assistant

## 2016-08-31 ENCOUNTER — Encounter: Payer: Self-pay | Admitting: *Deleted

## 2016-08-31 NOTE — Progress Notes (Signed)
Gave completed FMLA form back to medical records to process. CW,MD gave single continuous period of time from 06/08/16-11/06/16.

## 2016-08-31 NOTE — Telephone Encounter (Signed)
Last refill 08/26/2015. Please advise. Thanks.

## 2016-08-31 NOTE — Telephone Encounter (Signed)
Too early to refill.

## 2016-08-31 NOTE — Telephone Encounter (Signed)
Please advise 

## 2016-09-02 ENCOUNTER — Encounter (INDEPENDENT_AMBULATORY_CARE_PROVIDER_SITE_OTHER): Payer: Self-pay | Admitting: Physical Medicine and Rehabilitation

## 2016-09-02 ENCOUNTER — Ambulatory Visit (INDEPENDENT_AMBULATORY_CARE_PROVIDER_SITE_OTHER): Payer: Medicare Other | Admitting: Physical Medicine and Rehabilitation

## 2016-09-02 VITALS — BP 140/95 | HR 69

## 2016-09-02 DIAGNOSIS — R531 Weakness: Secondary | ICD-10-CM | POA: Diagnosis not present

## 2016-09-02 DIAGNOSIS — R202 Paresthesia of skin: Secondary | ICD-10-CM | POA: Diagnosis not present

## 2016-09-02 NOTE — Progress Notes (Signed)
Roberta Bryant - 66 y.o. female MRN [id number]  Date of birth: [birth year]  Office Visit Note: Visit Date: 09/02/2016 PCP: No PCP Per Patient Referred by: Angelina Sheriff, MD  Subjective: Chief Complaint  Patient presents with  . Right Arm - Pain, Numbness, Tingling   HPI: [name] is a 66 -year-old female who came in today for possible bilateral facet joint blocks of the lumbar spine. However, she did see Benita Stabile, PA in our office on the 19th and then subsequently saw Dr. Jannifer Franklin her neurologist for a couple of days later. When she saw Mr. Carlis Abbott she was having some low back pain and she had prior low back pain on another visit. MRI findings suggested that facet mediated pain may be the reason she was having a lot of her back pain. This MRI is reviewed below. In the interim her biggest complaint has been numbness, tingling, weakness and burning in the right upper extremity and hand along with weakness in the right more than left leg. She does endorse some numbness tingling from the knee down in both legs. Dr. Jannifer Franklin felt that she may have had a stroke and was getting an MRI of her brain. She ambulates today in a wheelchair. She states she is not having any back pain at all at this point. She does note some clumsiness of the right arm and hand with discoordination. She is right-hand dominant. She used to carry a diagnosis of Parkinson's. Dr. Jannifer Franklin feels like this is not been an accurate diagnosis. Her case is further complicated by type 2 diabetes with end-stage renal disease. I do not see where she carries a diagnosis of poly-peripheral neuropathy but I think this may explain her leg and feet numbness.     Review of Systems  Constitutional: Negative for chills, fever, malaise/fatigue and weight loss.  HENT: Negative for hearing loss and sinus pain.   Eyes: Negative for blurred vision, double vision and photophobia.  Respiratory: Negative for cough and shortness of breath.     Cardiovascular: Negative for chest pain, palpitations and leg swelling.  Gastrointestinal: Negative for abdominal pain, nausea and vomiting.  Genitourinary: Negative for flank pain.  Musculoskeletal: Negative for myalgias.  Skin: Negative for itching and rash.  Neurological: Positive for tingling, tremors, sensory change, focal weakness and weakness.  Endo/Heme/Allergies: Negative.   Psychiatric/Behavioral: Negative for depression.  All other systems reviewed and are negative.  Otherwise per HPI.  Assessment & Plan: Visit Diagnoses:  1. Paresthesia of skin   2. Weakness     Plan: Findings:  Chronic history of back problems with mostly facet arthropathy without any focal stenosis. There is some foraminal and extraforaminal narrowing on the right at L4 but no frank nerve compression. Nothing in the lumbar spine MRI to explain weakness of the legs or arms obviously. I do feel like from the exam in looking at Dr. Tobey Grim note she may have had a stroke. She is scheduled for MRI of the brain. I think at this point given the fact that she's not really complaining much of back pain that her biggest problem that she needs to address his the neurologic issues. She'll continue to follow with Dr. Jannifer Franklin. If her back pain are ever a major issue again I think she would benefit from facet joint blocks. I spent more than 25 minutes speaking face-to-face with the patient with 50% of the time in counseling.    Meds & Orders: No orders of the defined types  were placed in this encounter.  No orders of the defined types were placed in this encounter.   Follow-up: Return Back pain becomes more of an issue.   Procedures: No procedures performed  No notes on file   Clinical History: Lumbar spine MRI 08/19/2016 L4-5: Moderately severe bilateral facet arthritis with grade 1 spondylolisthesis with a broad-based disc bulge is well as a central subligamentous disc protrusion without neural impingement.  Bilateral joint effusions. No change since the prior study. The disc protrusion extends foraminal and extraforaminal on the right adjacent to but not compressing the right L4 nerve lateral to the neural foramen.  L5-S1: Disc desiccation. No disc bulging or protrusion. Moderate bilateral facet arthritis, unchanged.  IMPRESSION: 1. No significant change in the appearance of the lumbar spine since the prior study. 2. Moderately severe bilateral facet arthritis and grade 1 spondylolisthesis at L4-5 without focal neural impingement, unchanged. 3. Moderate facet arthritis at L5-S1 bilaterally, unchanged.  She reports that she has quit smoking. Her smoking use included Cigarettes. She quit after 3.00 years of use. She has never used smokeless tobacco.   Recent Labs  11/24/15 0311  HGBA1C 6.4*    Objective:  VS:  HT:    WT:   BMI:     BP:(!) 140/95  HR:69bpm  TEMP: ( )  RESP:99 % Physical Exam  Constitutional: She is oriented to person, place, and time. She appears well-developed and well-nourished. No distress.  HENT:  Head: Normocephalic and atraumatic.  Nose: Nose normal.  Mouth/Throat: Oropharynx is clear and moist.  She appears to have a resting jaw tremor  Eyes: Conjunctivae are normal. Pupils are equal, round, and reactive to light.  Neck: Normal range of motion. Neck supple. No tracheal deviation present.  Cardiovascular: Regular rhythm and intact distal pulses.   Pulmonary/Chest: Effort normal and breath sounds normal.  Abdominal: Soft. She exhibits no distension. There is no guarding.  Musculoskeletal:  She has great difficulty arising from a seated position. Again she has good strength with dorsiflexion plantar flexion but some discoordination on the right.  Lymphadenopathy:    She has no cervical adenopathy.  Neurological: She is alert and oriented to person, place, and time. No cranial nerve deficit. Coordination abnormal.  She is unable to really perform rapid  alternating movements in the right hand especially compared to the left and she is right-handed. She has good distal strength with dorsiflexion plantarflexion. She seems to have some loss of coordination here as well on the right more than left. She has active strength with hip flexion and knee extension.  Skin: Skin is warm. No rash noted. No erythema.  Psychiatric: She has a normal mood and affect. Her behavior is normal.  Nursing note and vitals reviewed.   Ortho Exam Imaging: No results found.  Past Medical/Family/Surgical/Social History: Medications & Allergies reviewed per EMR Patient Active Problem List   Diagnosis Date Noted  . Gait abnormality 08/28/2016  . Chest pain 11/23/2015  . ESRD on dialysis ([facility])   . Type 2 diabetes mellitus with complication ([facility])   . Essential hypertension   . Abnormal ECG   . Pain in the chest   . Hypotension 10/30/2014  . Abnormal EKG 10/30/2014  . 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 End stage renal disease ([facility]) 07/18/2012  . Other complications due to renal dialysis device, implant, and graft 07/18/2012   Past Medical History:  Diagnosis Date  . Anemia   . CHF (congestive heart failure) ([facility])   .  Childhood asthma   . Chronic upper back pain   . ESRD (end stage renal disease) on dialysis ([facility])    "TTS; [facility] in [place]" (10/30/2014)  . Gait abnormality 08/28/2016  . GERD (gastroesophageal reflux disease)   . Headache    "a few times/week" (10/30/2014)  . History of blood transfusion 1996   "related to menses"  . Hyperlipidemia   . Hypertension   . Hypothyroidism   . IBS (irritable bowel syndrome)   . Myocardial infarction 2004  . Osteoarthritis   . Type II diabetes mellitus ([facility])    Family History  Problem Relation Age of Onset  . Epilepsy Mother   . Diabetes Mother   . Cancer Mother   . Alcohol abuse Father    Past Surgical History:  Procedure Laterality Date  . APPENDECTOMY    . AV FISTULA  PLACEMENT Left 12/03/2006   "forearm"  . AV FISTULA REPAIR Left 11/25/2009   "forearm"  . CHOLECYSTECTOMY OPEN  1970's  . HERNIA REPAIR    . INSERTION OF DIALYSIS CATHETER Right 2010   "chest"  . PARATHYROIDECTOMY  2010?   Parathyroid autotransplantation   . REVISON OF ARTERIOVENOUS FISTULA Left 12/04/2013   Procedure: REVISON OF ARTERIOVENOUS FISTULA;  Surgeon: Rosetta Posner, MD;  Location: [facility];  Service: Vascular;  Laterality: Left;  . TUBAL LIGATION  1989  . UMBILICAL HERNIA REPAIR  1990's X 2   Social History   Occupational History  . Disabled    Social History Main Topics  . Smoking status: Former Smoker    Years: 3.00    Types: Cigarettes  . Smokeless tobacco: Never Used     Comment: "stopped smoking in the 1970's"  . Alcohol use No  . Drug use: Yes    Frequency: 7.0 times per week    Types: Marijuana     Comment: 10/30/2014 "maybe once/wk"  . Sexual activity: Not Currently

## 2016-09-07 DIAGNOSIS — Z0289 Encounter for other administrative examinations: Secondary | ICD-10-CM

## 2016-09-07 NOTE — Telephone Encounter (Signed)
Patient is scheduled for her Doppler.

## 2016-09-09 ENCOUNTER — Telehealth: Payer: Self-pay

## 2016-09-09 NOTE — Telephone Encounter (Signed)
Message from answering service:  [place] BY HIS         EMPLOYER, HAVE THEY BEEN SENT

## 2016-09-09 NOTE — Telephone Encounter (Signed)
I sent the earlier message to Hilda Blades because it looks like you already completed this.

## 2016-09-09 NOTE — Telephone Encounter (Signed)
Shelly Bombard @ [employer](the employer of pt son) is asking for a call back @ [phone] xt [phone] re: help in completing the FMLA in order for the son to continue to bring his mother to her doctor appointments.

## 2016-09-10 NOTE — Telephone Encounter (Signed)
Made adjustments to FMLA paperwork for son to include time to bring his mother to doctor appt. Gave to [employer] to re-send.

## 2016-09-11 ENCOUNTER — Ambulatory Visit
Admission: RE | Admit: 2016-09-11 | Discharge: 2016-09-11 | Disposition: A | Discharge status: Home / Self Care | Payer: Medicare Other | Source: Ambulatory Visit | Attending: Neurology | Admitting: Neurology

## 2016-09-11 DIAGNOSIS — R0989 Other specified symptoms and signs involving the circulatory and respiratory systems: Secondary | ICD-10-CM

## 2016-09-12 ENCOUNTER — Telehealth: Payer: Self-pay | Admitting: Neurology

## 2016-09-12 NOTE — Telephone Encounter (Signed)
I called the patient. The carotid doppler is OK, bruit may be from radiation from a heart murmur. MRI of the brain is pending.  Carotid doppler 09/10/16:  IMPRESSION: 1. Bilateral carotid bifurcation and proximal ICA plaque, resulting in less than 50% diameter stenosis. 2. Antegrade bilateral vertebral arterial flow.

## 2016-09-13 ENCOUNTER — Ambulatory Visit
Admission: RE | Admit: 2016-09-13 | Discharge: 2016-09-13 | Disposition: A | Discharge status: Home / Self Care | Payer: Medicare Other | Source: Ambulatory Visit | Attending: Neurology | Admitting: Neurology

## 2016-09-13 DIAGNOSIS — R269 Unspecified abnormalities of gait and mobility: Secondary | ICD-10-CM

## 2016-09-14 ENCOUNTER — Telehealth: Payer: Self-pay | Admitting: Neurology

## 2016-09-14 ENCOUNTER — Telehealth: Payer: Self-pay | Admitting: *Deleted

## 2016-09-14 DIAGNOSIS — R269 Unspecified abnormalities of gait and mobility: Secondary | ICD-10-CM

## 2016-09-14 NOTE — Telephone Encounter (Signed)
I will make a referral for physical therapy at [facility]

## 2016-09-14 NOTE — Telephone Encounter (Signed)
I called patient. The MRI the brain shows a moderate to severe level of small vessel disease that include the paraventricular areas as well as the brainstem. No change from 2015.  I discussed this with the patient, I am not sure she has true Parkinson's disease, I would cut back on Sinemet taking one tablet 3 times a day for 4 weeks, then go to 1 twice a day, the patient will follow-up on 01/08/2017.  The patient had been in physical therapy, she will call our office with the name of the physical therapy office, I will make another referral for this.   MRI brain 09/13/16:  IMPRESSION:  This MRI of the brain without contrast shows the following: 1.    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 There is a cortical based small chronic infarction in the left parietal lobe.  Both of these were noted on the previous MRI. 2.    Extensive T2/FLAIR hyperintense foci in the hemispheres and pons most consistent with severe chronic microvascular ischemic change.   Demyelination would be less likely to give this pattern. The extent is essentially unchanged when compared to the previous MRI. 3.    Left maxillary chronic sinusitis 4.    Small amount of fluid in the right mastoid air cells probably due to mild eustachian tube dysfunction.   This was also noted on the prior MRI.

## 2016-09-21 ENCOUNTER — Ambulatory Visit (INDEPENDENT_AMBULATORY_CARE_PROVIDER_SITE_OTHER): Payer: Medicare Other | Admitting: Physician Assistant

## 2016-09-29 ENCOUNTER — Telehealth: Payer: Self-pay | Admitting: Neurology

## 2016-09-29 NOTE — Telephone Encounter (Signed)
Called patient. Scheduled f/u for patient this Friday at 12pm, check in 1130am. Offered 4/25 at 730am but patient declined. She has therapy at 8am tomorrow, unable to make it.

## 2016-09-29 NOTE — Telephone Encounter (Signed)
Patients son [name] (listed on DPR) called office in reference to patient having headaches daily starting 2 weeks.  Patient has been taking tylenol but not taking every day.  Requesting to see Dr. Jannifer Franklin.

## 2016-09-29 NOTE — Telephone Encounter (Signed)
We will get a revisit set up for this patient to evaluate her new problem with headaches.

## 2016-10-02 ENCOUNTER — Ambulatory Visit (INDEPENDENT_AMBULATORY_CARE_PROVIDER_SITE_OTHER): Payer: Medicare Other | Admitting: Neurology

## 2016-10-02 ENCOUNTER — Encounter: Payer: Self-pay | Admitting: Neurology

## 2016-10-02 VITALS — BP 130/69 | HR 69 | Ht 59.0 in | Wt 205.5 lb

## 2016-10-02 DIAGNOSIS — R269 Unspecified abnormalities of gait and mobility: Secondary | ICD-10-CM | POA: Diagnosis not present

## 2016-10-02 MED ORDER — TIZANIDINE HCL 2 MG PO CAPS: 2.0000 mg | ORAL_CAPSULE | Freq: Two times a day (BID) | ORAL | 3 refills | Status: DC

## 2016-10-02 NOTE — Patient Instructions (Signed)
   With the Sinemet 25/100 tablet, take one twice a day for 2 weeks, then take 1/2 tablet twice a day for 2 weeks, then stop.

## 2016-10-02 NOTE — Progress Notes (Signed)
Reason for visit: Gait disorder  Roberta Bryant is an 66 y.o. female  History of present illness:  Roberta Bryant is a 66 year old right-handed Hispanic female with a history of hypertension, diabetes, and end-stage renal disease on hemodialysis. She has been felt to have Parkinson's disease, but on her last visit there were no real features of Parkinson's disease seen. The patient is being tapered down off of the Sinemet tablet, she is now taking 1 tablet 3 times daily. She has a lot of right shoulder discomfort, she has been seen by orthopedic surgery previously, Dr. Rush Farmer. The patient has reported a recent fall within the last week. She had stooped over to pick up something, and she stood up and fell backwards. The patient has developed some problems with daily headaches that have increased in frequency and duration. She has a history of headaches that date back several years but over the last 3 or 4 weeks the headaches have been more prominent. The headaches are in the bifrontal areas, sometimes spreading around the head. She returns for an evaluation.  Past Medical History:  Diagnosis Date  . Anemia   . CHF (congestive heart failure) ([place])   . Childhood asthma   . Chronic upper back pain   . ESRD (end stage renal disease) on dialysis ([place])    "TTS; [facility] in August" (10/30/2014)  . Gait abnormality 08/28/2016  . GERD (gastroesophageal reflux disease)   . Headache    "a few times/week" (10/30/2014)  . History of blood transfusion 1996   "related to menses"  . Hyperlipidemia   . Hypertension   . Hypothyroidism   . IBS (irritable bowel syndrome)   . Myocardial infarction ([place]) 2004  . Osteoarthritis   . Type II diabetes mellitus ([place])     Past Surgical History:  Procedure Laterality Date  . APPENDECTOMY    . AV FISTULA PLACEMENT Left 12/03/2006   "forearm"  . AV FISTULA REPAIR Left 11/25/2009   "forearm"  . CHOLECYSTECTOMY OPEN  1970's  . HERNIA REPAIR    .  INSERTION OF DIALYSIS CATHETER Right 2010   "chest"  . PARATHYROIDECTOMY  2010?   Parathyroid autotransplantation   . REVISON OF ARTERIOVENOUS FISTULA Left 12/04/2013   Procedure: REVISON OF ARTERIOVENOUS FISTULA;  Surgeon: Rosetta Posner, MD;  Location: [facility];  Service: Vascular;  Laterality: Left;  . TUBAL LIGATION  1989  . UMBILICAL HERNIA REPAIR  1990's X 2    Family History  Problem Relation Age of Onset  . Epilepsy Mother   . Diabetes Mother   . Cancer Mother   . Alcohol abuse Father     Social history:  reports that she has quit smoking. Her smoking use included Cigarettes. She quit after 3.00 years of use. She has never used smokeless tobacco. She reports that she uses drugs, including Marijuana, about 7 times per week. She reports that she does not drink alcohol.    Allergies  Allergen Reactions  . Crestor [Rosuvastatin Calcium] Other (See Comments)    Dizziness, couldn't walk  . Sulfa Antibiotics Anaphylaxis  . Amoxicillin Hives  . Chlorphen-Phenyleph-Asa Rash  . Ibuprofen Hives  . Naldecon Senior [Guaifenesin] Hives    Medications:  Prior to Admission medications   Medication Sig Start Date End Date Taking? Authorizing Provider  acetaminophen (TYLENOL) 325 MG tablet Take 650 mg by mouth every 6 (six) hours as needed for moderate pain or headache.    Yes Historical Provider, MD  aspirin EC  81 MG tablet Take 81 mg by mouth every morning.   Yes Historical Provider, MD  docusate sodium (COLACE) 100 MG capsule Take 100 mg by mouth 2 (two) times daily.   Yes Historical Provider, MD  ezetimibe (ZETIA) 10 MG tablet Take 10 mg by mouth daily. 05/10/16  Yes Historical Provider, MD  fenofibrate (TRICOR) 145 MG tablet Take 145 mg by mouth daily. 05/15/16  Yes Historical Provider, MD  fenofibrate 120 MG TABS Take 1 tablet (120 mg total) by mouth daily. 11/24/15  Yes Vivi Barrack, MD  finasteride (PROSCAR) 5 MG tablet Take 2.5 mg by mouth daily.   Yes Historical Provider, MD    levothyroxine (SYNTHROID, LEVOTHROID) 50 MCG tablet Take 50 mcg by mouth daily before breakfast.    Yes Historical Provider, MD  loperamide (IMODIUM A-D) 2 MG tablet Take 2 mg by mouth 4 (four) times daily as needed for diarrhea or loose stools.   Yes Historical Provider, MD  meclizine (ANTIVERT) 25 MG tablet Take 12.5-25 mg by mouth 3 (three) times daily as needed for dizziness.    Yes Historical Provider, MD  methocarbamol (ROBAXIN) 500 MG tablet Take 1 tablet (500 mg total) by mouth 2 (two) times daily. 02/27/16  Yes Alfonzo Beers, MD  multivitamin (RENA-VIT) TABS tablet Take 1 tablet by mouth daily.    Yes Historical Provider, MD  omeprazole (PRILOSEC) 20 MG capsule Take 20 mg by mouth 2 (two) times daily before a meal.   Yes Historical Provider, MD  prochlorperazine (COMPAZINE) 5 MG tablet Take 5 mg by mouth every 6 (six) hours as needed for nausea or vomiting (for dialysis).   Yes Historical Provider, MD  sitaGLIPtin (JANUVIA) 25 MG tablet Take 25 mg by mouth daily.   Yes Historical Provider, MD  ticagrelor (BRILINTA) 90 MG TABS tablet Take 90 mg by mouth 2 (two) times daily.   Yes Historical Provider, MD  tizanidine (ZANAFLEX) 2 MG capsule TAKE ONE CAPSULE BY MOUTH ONCE TO TWICE DAILY AS NEEDED 08/31/16  Yes Pete Pelt, PA-C  Vitamin D, Ergocalciferol, (DRISDOL) 50000 units CAPS capsule Take 50,000 Units by mouth every Sunday.   Yes Historical Provider, MD  Zinc 50 MG CAPS Take 50 mg by mouth every morning.    Yes Historical Provider, MD  carbidopa-levodopa (SINEMET) 25-100 MG per tablet Take 1 tablet by mouth 3 (three) times daily.  02/21/14 05/23/16  Historical Provider, MD    ROS:  Out of a complete 14 system review of symptoms, the patient complains only of the following symptoms, and all other reviewed systems are negative.  Headache  Blood pressure 130/69, pulse 69, height 4\' 11"  (1.499 m), weight 205 lb 8 oz (93.2 kg).  Physical Exam  General: The patient is alert and  cooperative at the time of the examination. The patient is moderately to markedly obese.  Neuromuscular: The patient has severe pain and limitation of movement across the right shoulder with elevation. The patient is only able to elevate the arm about 45.  Skin: No significant peripheral edema is noted.   Neurologic Exam  Mental status: The patient is alert and oriented x 3 at the time of the examination. The patient has apparent normal recent and remote memory, with an apparently normal attention span and concentration ability.   Cranial nerves: Facial symmetry is present. Speech is normal, no aphasia or dysarthria is noted. Extraocular movements are full. Visual fields are full.  Motor: The patient has good strength in all 4 extremities.  Sensory examination: Soft touch sensation is symmetric on the face, arms, and legs.  Coordination: The patient has good finger-nose-finger and heel-to-shin bilaterally.  Gait and station: The patient is able to stand with assistance. Once up, she can walk with assistance, she has some gait instability, tendency to fall to the side. Romberg is negative, but is unsteady.  Reflexes: Deep tendon reflexes are symmetric, but are depressed.   Assessment/Plan:  1. Gait disorder  2. Cerebrovascular disease by MRI brain  3. Chronic daily headache  The patient may not have Parkinson's disease. The patient will continue the taper off of the Sinemet, she will go to 1 tablet twice daily for 2 weeks, then one half tablet twice daily for 2 weeks, then stop the medication. She will follow-up in 4 months. She is in physical therapy currently, she will continue this. Given the increase in headaches, she will be placed back on tizanidine 2 mg twice daily, we can go up on the dose if needed. A prescription was sent in for the medication. The patient will contact her orthopedic surgeon regarding the right shoulder problem.  Jill Alexanders MD 10/02/2016 12:04  PM  [facility] [address] [place], [place] [address]  Phone [phone] Fax [phone]

## 2016-10-05 ENCOUNTER — Telehealth: Payer: Self-pay | Admitting: Neurology

## 2016-10-05 MED ORDER — TIZANIDINE HCL 2 MG PO TABS: 2.0000 mg | ORAL_TABLET | Freq: Two times a day (BID) | ORAL | 3 refills | Status: DC

## 2016-10-05 NOTE — Telephone Encounter (Signed)
Pt said ins. will not cover tizanidine (ZANAFLEX) 2 MG capsule . Is there something else?

## 2016-10-05 NOTE — Addendum Note (Signed)
Addended by: Kathrynn Ducking on: 10/05/2016 01:14 PM   Modules accepted: Orders

## 2016-10-05 NOTE — Telephone Encounter (Signed)
The patient was initially given up her prescription for tizanidine capsules, this preparation is much more expensive, not covered through insurance. I will call in another prescription for the tizanidine tablets.

## 2016-10-08 ENCOUNTER — Emergency Department (HOSPITAL_COMMUNITY)
Admission: EM | Admit: 2016-10-08 | Discharge: 2016-10-08 | Disposition: A | Discharge status: Home / Self Care | Payer: Medicare Other | Attending: Emergency Medicine | Admitting: Emergency Medicine

## 2016-10-08 ENCOUNTER — Emergency Department (HOSPITAL_COMMUNITY): Payer: Medicare Other

## 2016-10-08 DIAGNOSIS — Z992 Dependence on renal dialysis: Secondary | ICD-10-CM | POA: Diagnosis not present

## 2016-10-08 DIAGNOSIS — N186 End stage renal disease: Secondary | ICD-10-CM | POA: Diagnosis not present

## 2016-10-08 DIAGNOSIS — E1122 Type 2 diabetes mellitus with diabetic chronic kidney disease: Secondary | ICD-10-CM | POA: Diagnosis not present

## 2016-10-08 DIAGNOSIS — E039 Hypothyroidism, unspecified: Secondary | ICD-10-CM | POA: Diagnosis not present

## 2016-10-08 DIAGNOSIS — Z87891 Personal history of nicotine dependence: Secondary | ICD-10-CM | POA: Diagnosis not present

## 2016-10-08 DIAGNOSIS — R42 Dizziness and giddiness: Secondary | ICD-10-CM | POA: Diagnosis present

## 2016-10-08 DIAGNOSIS — I132 Hypertensive heart and chronic kidney disease with heart failure and with stage 5 chronic kidney disease, or end stage renal disease: Secondary | ICD-10-CM | POA: Diagnosis not present

## 2016-10-08 DIAGNOSIS — I509 Heart failure, unspecified: Secondary | ICD-10-CM | POA: Insufficient documentation

## 2016-10-08 DIAGNOSIS — Z7982 Long term (current) use of aspirin: Secondary | ICD-10-CM | POA: Diagnosis not present

## 2016-10-08 LAB — URINALYSIS, ROUTINE W REFLEX MICROSCOPIC
BILIRUBIN URINE: NEGATIVE
Glucose, UA: NEGATIVE mg/dL
KETONES UR: NEGATIVE mg/dL
Leukocytes, UA: NEGATIVE
Nitrite: NEGATIVE
Protein, ur: 100 mg/dL — AB
Specific Gravity, Urine: 1.014 (ref 1.005–1.030)
pH: 7 (ref 5.0–8.0)

## 2016-10-08 LAB — BASIC METABOLIC PANEL
Anion gap: 13 (ref 5–15)
BUN: 28 mg/dL — ABNORMAL HIGH (ref 6–20)
CALCIUM: 10 mg/dL (ref 8.9–10.3)
CHLORIDE: 99 mmol/L — AB (ref 101–111)
CO2: 25 mmol/L (ref 22–32)
CREATININE: 5.31 mg/dL — AB (ref 0.44–1.00)
GFR calc Af Amer: 9 mL/min — ABNORMAL LOW (ref >60)
GFR calc non Af Amer: 8 mL/min — ABNORMAL LOW (ref >60)
GLUCOSE: 138 mg/dL — AB (ref 65–99)
Potassium: 3.9 mmol/L (ref 3.5–5.1)
Sodium: 137 mmol/L (ref 135–145)

## 2016-10-08 LAB — CBG MONITORING, ED: GLUCOSE-CAPILLARY: 133 mg/dL — AB (ref 65–99)

## 2016-10-08 LAB — CBC
HCT: 38.1 % (ref 36.0–46.0)
HEMOGLOBIN: 12.6 g/dL (ref 12.0–15.0)
MCH: 35.4 pg — AB (ref 26.0–34.0)
MCHC: 33.1 g/dL (ref 30.0–36.0)
MCV: 107 fL — AB (ref 78.0–100.0)
PLATELETS: 272 10*3/uL (ref 150–400)
RBC: 3.56 MIL/uL — ABNORMAL LOW (ref 3.87–5.11)
RDW: 14 % (ref 11.5–15.5)
WBC: 9.1 10*3/uL (ref 4.0–10.5)

## 2016-10-08 NOTE — ED Provider Notes (Signed)
[facility] DEPT Provider Note   CSN: [id number] Arrival date & time: 10/08/16  1027     History   Chief Complaint Chief Complaint  Patient presents with  . Dizziness  . Hypotension    HPI Roberta Bryant is a 66 y.o. female.  Patient experienced a bout of lightheadedness and hypotension at dialysis today. She is feeling much better now. Past medical history includes a stroke with residual right-sided weakness, CHF, hypertension, MI, diabetes mellitus. No new neurological deficits, chest pain, dyspnea.      Past Medical History:  Diagnosis Date  . Anemia   . CHF (congestive heart failure) ([place])   . Childhood asthma   . Chronic upper back pain   . ESRD (end stage renal disease) on dialysis ([place])    "TTS; [facility] in [place]" (10/30/2014)  . Gait abnormality 08/28/2016  . GERD (gastroesophageal reflux disease)   . Headache    "a few times/week" (10/30/2014)  . History of blood transfusion 1996   "related to menses"  . Hyperlipidemia   . Hypertension   . Hypothyroidism   . IBS (irritable bowel syndrome)   . Myocardial infarction ([place]) 2004  . Osteoarthritis   . Type II diabetes mellitus [facility])     Patient Active Problem List   Diagnosis Date Noted  . Gait abnormality 08/28/2016  . Chest pain 11/23/2015  . ESRD on dialysis ([place])   . Type 2 diabetes mellitus with complication ([place])   . Essential hypertension   . Abnormal ECG   . Pain in the chest   . Hypotension 10/30/2014  . Abnormal EKG 10/30/2014  . 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 End stage renal disease ([place]) 07/18/2012  . Other complications due to renal dialysis device, implant, and graft 07/18/2012    Past Surgical History:  Procedure Laterality Date  . APPENDECTOMY    . AV FISTULA PLACEMENT Left 12/03/2006   "forearm"  . AV FISTULA REPAIR Left 11/25/2009   "forearm"  . CHOLECYSTECTOMY OPEN  1970's  . HERNIA REPAIR    . INSERTION OF DIALYSIS CATHETER Right  2010   "chest"  . PARATHYROIDECTOMY  2010?   Parathyroid autotransplantation   . REVISON OF ARTERIOVENOUS FISTULA Left 12/04/2013   Procedure: REVISON OF ARTERIOVENOUS FISTULA;  Surgeon: Rosetta Posner, MD;  Location: [facility];  Service: Vascular;  Laterality: Left;  . TUBAL LIGATION  1989  . UMBILICAL HERNIA REPAIR  1990's X 2    OB History    No data available       Home Medications    Prior to Admission medications   Medication Sig Start Date End Date Taking? Authorizing Provider  acetaminophen (TYLENOL) 325 MG tablet Take 650 mg by mouth every 6 (six) hours as needed for moderate pain or headache.    Yes Historical Provider, MD  aspirin EC 81 MG tablet Take 81 mg by mouth every morning.   Yes Historical Provider, MD  calcium acetate (PHOSLO) 667 MG capsule Take 2-4 capsules by mouth 4 (four) times daily. 4 with meals and 2 with snacks 10/05/16  Yes Historical Provider, MD  carbidopa-levodopa (SINEMET) 25-100 MG per tablet Take 1 tablet by mouth 2 (two) times daily.  02/21/14 10/08/16 Yes Historical Provider, MD  carvedilol (COREG) 12.5 MG tablet Take 12.5 mg by mouth at bedtime. 08/06/16  Yes Historical Provider, MD  docusate sodium (COLACE) 100 MG capsule Take 100 mg by mouth 2 (two) times daily.   Yes Historical Provider, MD  ezetimibe (ZETIA) 10 MG tablet Take 10 mg by mouth daily. 05/10/16  Yes Historical Provider, MD  fenofibrate 120 MG TABS Take 1 tablet (120 mg total) by mouth daily. Patient taking differently: Take 145 mg by mouth daily.  11/24/15  Yes Vivi Barrack, MD  finasteride (PROSCAR) 5 MG tablet Take 2.5 mg by mouth daily.   Yes Historical Provider, MD  levothyroxine (SYNTHROID, LEVOTHROID) 50 MCG tablet Take 50 mcg by mouth daily before breakfast.    Yes Historical Provider, MD  loperamide (IMODIUM A-D) 2 MG tablet Take 2 mg by mouth 4 (four) times daily as needed for diarrhea or loose stools.   Yes Historical Provider, MD  meclizine (ANTIVERT) 25 MG tablet Take 12.5-25 mg by  mouth 3 (three) times daily as needed for dizziness.    Yes Historical Provider, MD  multivitamin (RENA-VIT) TABS tablet Take 1 tablet by mouth daily.    Yes Historical Provider, MD  omeprazole (PRILOSEC) 20 MG capsule Take 20 mg by mouth 2 (two) times daily before a meal.   Yes Historical Provider, MD  prochlorperazine (COMPAZINE) 5 MG tablet Take 5 mg by mouth every 6 (six) hours as needed for nausea or vomiting (for dialysis).   Yes Historical Provider, MD  sitaGLIPtin (JANUVIA) 25 MG tablet Take 25 mg by mouth daily.   Yes Historical Provider, MD  ticagrelor (BRILINTA) 90 MG TABS tablet Take 90 mg by mouth 2 (two) times daily.   Yes Historical Provider, MD  tiZANidine (ZANAFLEX) 2 MG tablet Take 1 tablet (2 mg total) by mouth 2 (two) times daily. 10/05/16  Yes Kathrynn Ducking, MD  Vitamin D, Ergocalciferol, (DRISDOL) 50000 units CAPS capsule Take 50,000 Units by mouth every Sunday.   Yes Historical Provider, MD  Zinc 50 MG CAPS Take 50 mg by mouth every morning.    Yes Historical Provider, MD  methocarbamol (ROBAXIN) 500 MG tablet Take 1 tablet (500 mg total) by mouth 2 (two) times daily. Patient not taking: Reported on 10/08/2016 02/27/16   Alfonzo Beers, MD    Family History Family History  Problem Relation Age of Onset  . Epilepsy Mother   . Diabetes Mother   . Cancer Mother   . Alcohol abuse Father     Social History Social History  Substance Use Topics  . Smoking status: Former Smoker    Years: 3.00    Types: Cigarettes  . Smokeless tobacco: Never Used     Comment: "stopped smoking in the 1970's"  . Alcohol use No     Allergies   Crestor [rosuvastatin calcium]; Sulfa antibiotics; Pravastatin; Rosuvastatin; Amoxicillin; Chlorphen-phenyleph-asa; Ibuprofen; and Naldecon senior [guaifenesin]   Review of Systems Review of Systems  All other systems reviewed and are negative.    Physical Exam Updated Vital Signs BP (!) 125/101   Pulse 66   Resp (!) 25   Ht 4\' 11"   (1.499 m)   Wt 205 lb (93 kg)   SpO2 98%   BMI 41.40 kg/m   Physical Exam  Constitutional: She is oriented to person, place, and time. She appears well-developed and well-nourished.  Alert, no acute distress  HENT:  Head: Normocephalic and atraumatic.  Eyes: Conjunctivae are normal.  Neck: Neck supple.  Cardiovascular: Normal rate and regular rhythm.   Pulmonary/Chest: Effort normal and breath sounds normal.  Abdominal: Soft. Bowel sounds are normal.  Musculoskeletal: Normal range of motion.  Neurological: She is alert and oriented to person, place, and time.  Right arm and leg  weakness (old)  Skin: Skin is warm and dry.  Psychiatric: She has a normal mood and affect. Her behavior is normal.  Nursing note and vitals reviewed.    ED Treatments / Results  Labs (all labs ordered are listed, but only abnormal results are displayed) Labs Reviewed  BASIC METABOLIC PANEL - Abnormal; Notable for the following:       Result Value   Chloride 99 (*)    Glucose, Bld 138 (*)    BUN 28 (*)    Creatinine, Ser 5.31 (*)    GFR calc non Af Amer 8 (*)    GFR calc Af Amer 9 (*)    All other components within normal limits  CBC - Abnormal; Notable for the following:    RBC 3.56 (*)    MCV 107.0 (*)    MCH 35.4 (*)    All other components within normal limits  URINALYSIS, ROUTINE W REFLEX MICROSCOPIC - Abnormal; Notable for the following:    APPearance HAZY (*)    Hgb urine dipstick SMALL (*)    Protein, ur 100 (*)    Bacteria, UA RARE (*)    Squamous Epithelial / LPF 6-30 (*)    All other components within normal limits  CBG MONITORING, ED - Abnormal; Notable for the following:    Glucose-Capillary 133 (*)    All other components within normal limits    EKG  EKG Interpretation  Date/Time:  Thursday Oct 08 2016 10:36:34 EDT Ventricular Rate:  67 PR Interval:    QRS Duration: 96 QT Interval:  424 QTC Calculation: 448 R Axis:   -9 Text Interpretation:  Sinus rhythm Probable  left atrial enlargement LVH with secondary repolarization abnormality Baseline wander in lead(s) II III aVF Confirmed by Brietta Manso  MD, Chanika Byland ([id number]) on 10/08/2016 1:41:20 PM       Radiology Ct Head Wo Contrast  Result Date: 10/08/2016 CLINICAL DATA:  Dizziness EXAM: CT HEAD WITHOUT CONTRAST TECHNIQUE: Contiguous axial images were obtained from the base of the skull through the vertex without intravenous contrast. COMPARISON:  08/13/2016 FINDINGS: Brain: No evidence of acute infarction, hemorrhage, hydrocephalus, extra-axial collection or mass lesion/mass effect. Mild atrophic changes and chronic white matter ischemic change is again identified. Vascular: No hyperdense vessel or unexpected calcification. Skull: Normal. Negative for fracture or focal lesion. Sinuses/Orbits: No acute finding. Other: None. IMPRESSION: No acute abnormality noted. Chronic atrophic and ischemic changes. Electronically Signed   By: Inez Catalina M.D.   On: 10/08/2016 13:39    Procedures Procedures (including critical care time)  Medications Ordered in ED Medications - No data to display   Initial Impression / Assessment and Plan / ED Course  I have reviewed the triage vital signs and the nursing notes.  Pertinent labs & imaging results that were available during my care of the patient were reviewed by me and considered in my medical decision making (see chart for details).    Patient is at baseline. No new neurological deficits. CT head negative for acute changes. I suspect patient got hypotensive via volume depletion at dialysis. Discussed findings with the patient and her 2 sons.   Final Clinical Impressions(s) / ED Diagnoses   Final diagnoses:  ESRD (end stage renal disease) ([place])    New Prescriptions New Prescriptions   No medications on file     Nat Christen, MD 10/08/16 [phone]

## 2016-10-08 NOTE — ED Notes (Signed)
cbg was 133

## 2016-10-08 NOTE — Progress Notes (Signed)
LUA dialysis graft/fistula deaccessed per order.  Pressure applied 15 minutes by this Probation officer, then by Apolonio Schneiders NT. Site WNL. No signs of bleeding noted.

## 2016-10-08 NOTE — ED Notes (Signed)
EDP at bedside  

## 2016-10-08 NOTE — ED Triage Notes (Addendum)
Patient comes in per [facility]. Patient was at dialysis. Patient became lethargic and hypotensive. Hx of CVA with deficit to right side weakness/tingling. Recent fall two weeks ago. Patient takes aspirin. Generalized weakness. c/o dizziness. EMS v/s 150/76, 71 HR. 137 cgb.

## 2016-10-08 NOTE — ED Notes (Signed)
Patient in CT

## 2016-10-08 NOTE — Discharge Instructions (Signed)
Brain scan did not show any obvious problems. Follow-up with your nephrologist.

## 2016-10-21 ENCOUNTER — Encounter (INDEPENDENT_AMBULATORY_CARE_PROVIDER_SITE_OTHER): Payer: Self-pay | Admitting: Orthopaedic Surgery

## 2016-10-21 ENCOUNTER — Ambulatory Visit (INDEPENDENT_AMBULATORY_CARE_PROVIDER_SITE_OTHER): Payer: Medicare Other | Admitting: Orthopaedic Surgery

## 2016-10-21 VITALS — Ht 59.0 in | Wt 205.0 lb

## 2016-10-21 DIAGNOSIS — M75121 Complete rotator cuff tear or rupture of right shoulder, not specified as traumatic: Secondary | ICD-10-CM

## 2016-10-21 DIAGNOSIS — G5601 Carpal tunnel syndrome, right upper limb: Secondary | ICD-10-CM | POA: Diagnosis not present

## 2016-10-21 NOTE — Progress Notes (Signed)
Office Visit Note   Patient: Roberta Bryant           Date of Birth: July 15, 1950           MRN: [id number] Visit Date: 10/21/2016              Requested by: Valaria Good, PA-C [address] [place], [place] [address] PCP: Valaria Good, PA-C   Assessment & Plan: Visit Diagnoses:  1. Complete tear of right rotator cuff   2. Carpal tunnel syndrome, right upper limb     Plan: This is certainly a tough situation given her diabetes and her recent mini stroke. She is on aspirin and Brilinta. Right now would not recommend any surgery on the right shoulder due to the medication she is on as well as her recovery from stroke symptoms. I did give her a note to give her home health physical therapist to work on bilateral upper extremity strengthening. Given her neuropathy and the weakness in her hands certainly nerve conduction studies done through Dr. Ernestina Patches here could hopefully shed light on whether or not she has severe carpal tunnel syndrome and help determine what else we may be able to do. Certainly with any type of surgery she would need to be off of any type of blood thinners and cleared from a medical standpoint which would be quite difficult in the immediate future. We'll see her back myself in 3 weeks and of the shoulder of the nerve studies. She does have a fistula for dialysis on her left upper extremity so that study may be limited on that side. All questions were encouraged and answered.  Follow-Up Instructions: Return in about 3 weeks (around 11/11/2016).   Orders:  No orders of the defined types were placed in this encounter.  No orders of the defined types were placed in this encounter.     Procedures: No procedures performed   Clinical Data: No additional findings.   Subjective: Chief Complaint  Patient presents with  . Right Shoulder - Pain  . Right Hand - Numbness    Numbness, tingling, unable to make a fist.  The patient is well-known to me. She is a  diabetic with a history of Parkinson's disease. She unfortunately had a "mini stroke" in February of this year. She is on aspirin and I believe a blood thinning medication. She is mainly in a wheelchair due to balance issues. She has home health physical therapy coming to help with balance coordination but they're not working on her upper extremities. We actually obtained an MRI back in December of her right shoulder and I can review this with her today as well. She complains of upper chamois weakness and significant pain in her hands. She is on dialysis and does have a fistula for receiving dialysis through her left upper extremity. She is right-hand-dominant. She says the right arm feels heavy to her and it hurts severely and has numbness and tingling as well. Given her stroke symptoms as well this is detrimentally affected her activities daily living and her quality of life.  HPI  Review of Systems She currently denies any headache, chest pain, fever, chills, nausea, vomiting.  Objective: Vital Signs: Ht 4\' 11"  (1.499 m)   Wt 205 lb (93 kg)   BMI 41.40 kg/m   Physical Exam She is alert and oriented 3 and in no acute distress. Ortho Exam Examination of both upper extremities shows significant weakness in both hands. Both hands have pain with any  attempted motion more so on the right than the left. The right shoulder significantly weak and she is severely deconditioned. It is really hard to determine if her pain and neuropathy is related to an anatomic problems such as carpal tunnel versus just neuropathy and pain from her Parkinson's as well as recent stroke and diabetes. Specialty Comments:  No specialty comments available.  Imaging: No results found. MRIs are independent reviewed and shared with her and additional complete tear of the right rotator cuff with complete tear of the biceps tendon. There is some glenohumeral arthritis as well.  PMFS History: Patient Active Problem List    Diagnosis Date Noted  . Complete tear of right rotator cuff 10/21/2016  . Gait abnormality 08/28/2016  . Chest pain 11/23/2015  . ESRD on dialysis ([place])   . Type 2 diabetes mellitus with complication ([place])   . Essential hypertension   . Abnormal ECG   . Pain in the chest   . Hypotension 10/30/2014  . Abnormal EKG 10/30/2014  . 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 End stage renal disease ([place]) 07/18/2012  . Other complications due to renal dialysis device, implant, and graft 07/18/2012   Past Medical History:  Diagnosis Date  . Anemia   . CHF (congestive heart failure) ([place])   . Childhood asthma   . Chronic upper back pain   . ESRD (end stage renal disease) on dialysis ([place])    "TTS; [facility] in [place]" (10/30/2014)  . Gait abnormality 08/28/2016  . GERD (gastroesophageal reflux disease)   . Headache    "a few times/week" (10/30/2014)  . History of blood transfusion 1996   "related to menses"  . Hyperlipidemia   . Hypertension   . Hypothyroidism   . IBS (irritable bowel syndrome)   . Myocardial infarction ([place]) 2004  . Osteoarthritis   . Type II diabetes mellitus (H[facility])     Family History  Problem Relation Age of Onset  . Epilepsy Mother   . Diabetes Mother   . Cancer Mother   . Alcohol abuse Father     Past Surgical History:  Procedure Laterality Date  . APPENDECTOMY    . AV FISTULA PLACEMENT Left 12/03/2006   "forearm"  . AV FISTULA REPAIR Left 11/25/2009   "forearm"  . CHOLECYSTECTOMY OPEN  1970's  . HERNIA REPAIR    . INSERTION OF DIALYSIS CATHETER Right 2010   "chest"  . PARATHYROIDECTOMY  2010?   Parathyroid autotransplantation   . REVISON OF ARTERIOVENOUS FISTULA Left 12/04/2013   Procedure: REVISON OF ARTERIOVENOUS FISTULA;  Surgeon: Rosetta Posner, MD;  Location: [facility];  Service: Vascular;  Laterality: Left;  . TUBAL LIGATION  1989  . UMBILICAL HERNIA REPAIR  1990's X 2   Social History   Occupational History  . Disabled      Social History Main Topics  . Smoking status: Former Smoker    Years: 3.00    Types: Cigarettes  . Smokeless tobacco: Never Used     Comment: "stopped smoking in the 1970's"  . Alcohol use No  . Drug use: Yes    Frequency: 7.0 times per week    Types: Marijuana     Comment: 10/30/2014 "maybe once/wk"  . Sexual activity: Not Currently

## 2016-10-22 ENCOUNTER — Other Ambulatory Visit (INDEPENDENT_AMBULATORY_CARE_PROVIDER_SITE_OTHER): Payer: Self-pay

## 2016-10-22 DIAGNOSIS — M79641 Pain in right hand: Secondary | ICD-10-CM

## 2016-10-22 DIAGNOSIS — M79642 Pain in left hand: Principal | ICD-10-CM

## 2016-10-28 ENCOUNTER — Encounter: Payer: Self-pay | Admitting: Vascular Surgery

## 2016-11-06 ENCOUNTER — Encounter: Payer: Self-pay | Admitting: *Deleted

## 2016-11-06 ENCOUNTER — Ambulatory Visit (INDEPENDENT_AMBULATORY_CARE_PROVIDER_SITE_OTHER): Payer: Medicare Other | Admitting: Vascular Surgery

## 2016-11-06 ENCOUNTER — Encounter (INDEPENDENT_AMBULATORY_CARE_PROVIDER_SITE_OTHER): Payer: Medicare Other | Admitting: Physical Medicine and Rehabilitation

## 2016-11-06 ENCOUNTER — Encounter: Payer: Self-pay | Admitting: Vascular Surgery

## 2016-11-06 ENCOUNTER — Other Ambulatory Visit: Payer: Self-pay | Admitting: *Deleted

## 2016-11-06 VITALS — BP 112/68 | HR 61 | Temp 97.6°F | Resp 18 | Ht 59.0 in | Wt 205.0 lb

## 2016-11-06 DIAGNOSIS — T82898A Other specified complication of vascular prosthetic devices, implants and grafts, initial encounter: Secondary | ICD-10-CM | POA: Diagnosis not present

## 2016-11-06 NOTE — Progress Notes (Addendum)
  Requested by:  Desai, Ankita, PA-C [address] [place], [place] [address]  Reason for consultation: Left AVF aneurysm   History of Present Illness   Roberta Bryant is a 66 y.o. (10/16/1950) female w/ end stage renal disease-HD: T/R/S who presents for aneurysm in L BC AVF.  Pt denies any steal sx from her L BC AVF but she does not bleeding from the L BC AVF.  She notes that her HD tech continue to cannulate the same site.  A few weeks ago the bump on the L arm AVF became red.  It has seen then resolved.  The patient denies any fever or chills.  Past Medical History:  Diagnosis Date  . Anemia   . CHF (congestive heart failure) (HCC)   . Childhood asthma   . Chronic upper back pain   . ESRD (end stage renal disease) on dialysis ([facility])    "TTS; [facility] in [place]" (10/30/2014)  . Gait abnormality 08/28/2016  . GERD (gastroesophageal reflux disease)   . Headache    "a few times/week" (10/30/2014)  . History of blood transfusion 1996   "related to menses"  . Hyperlipidemia   . Hypertension   . Hypothyroidism   . IBS (irritable bowel syndrome)   . Myocardial infarction (HCC) 2004  . Osteoarthritis   . Stroke (HCC)   . Type II diabetes mellitus (HCC)     Past Surgical History:  Procedure Laterality Date  . APPENDECTOMY    . AV FISTULA PLACEMENT Left 12/03/2006   "forearm"  . AV FISTULA REPAIR Left 11/25/2009   "forearm"  . CHOLECYSTECTOMY OPEN  1970's  . CORONARY ANGIOPLASTY    . CORONARY ANGIOPLASTY WITH STENT PLACEMENT    . HERNIA REPAIR    . INSERTION OF DIALYSIS CATHETER Right 2010   "chest"  . PARATHYROIDECTOMY  2010?   Parathyroid autotransplantation   . REVISON OF ARTERIOVENOUS FISTULA Left 12/04/2013   Procedure: REVISON OF ARTERIOVENOUS FISTULA;  Surgeon: Todd F Early, MD;  Location: [facility];  Service: Vascular;  Laterality: Left;  . TUBAL LIGATION  1989  . UMBILICAL HERNIA REPAIR  1990's X 2    Social History   Social History  . Marital status:  Divorced    Spouse name: N/A  . Number of children: 4  . Years of education: Some college   Occupational History  . Disabled    Social History Main Topics  . Smoking status: Former Smoker    Years: 3.00    Types: Cigarettes  . Smokeless tobacco: Never Used     Comment: "stopped smoking in the 1970's"  . Alcohol use No  . Drug use: Yes    Frequency: 7.0 times per week    Types: Marijuana     Comment: 10/30/2014 "maybe once/wk"  . Sexual activity: Not Currently   Other Topics Concern  . Not on file   Social History Narrative   Lives at home with sons   Caffeine use: Coffee-decaf   Right-handed    Family History  Problem Relation Age of Onset  . Epilepsy Mother   . Diabetes Mother   . Cancer Mother   . Alcohol abuse Father     Current Outpatient Prescriptions  Medication Sig Dispense Refill  . acetaminophen (TYLENOL) 325 MG tablet Take 650 mg by mouth every 6 (six) hours as needed for moderate pain or headache.     . aspirin EC 81 MG tablet Take 81 mg by mouth every morning.    .   calcium acetate (PHOSLO) 667 MG capsule Take 2-4 capsules by mouth 4 (four) times daily. 4 with meals and 2 with snacks  11  . carvedilol (COREG) 12.5 MG tablet Take 12.5 mg by mouth at bedtime.  2  . docusate sodium (COLACE) 100 MG capsule Take 100 mg by mouth 2 (two) times daily.    . ezetimibe (ZETIA) 10 MG tablet Take 10 mg by mouth daily.  1  . fenofibrate 120 MG TABS Take 1 tablet (120 mg total) by mouth daily. (Patient taking differently: Take 145 mg by mouth daily. ) 30 tablet 0  . finasteride (PROSCAR) 5 MG tablet Take 2.5 mg by mouth daily.    . levothyroxine (SYNTHROID, LEVOTHROID) 50 MCG tablet Take 50 mcg by mouth daily before breakfast.     . loperamide (IMODIUM A-D) 2 MG tablet Take 2 mg by mouth 4 (four) times daily as needed for diarrhea or loose stools.    . meclizine (ANTIVERT) 25 MG tablet Take 12.5-25 mg by mouth 3 (three) times daily as needed for dizziness.     .  multivitamin (RENA-VIT) TABS tablet Take 1 tablet by mouth daily.     . omeprazole (PRILOSEC) 20 MG capsule Take 20 mg by mouth 2 (two) times daily before a meal.    . prochlorperazine (COMPAZINE) 5 MG tablet Take 5 mg by mouth every 6 (six) hours as needed for nausea or vomiting (for dialysis).    . sitaGLIPtin (JANUVIA) 25 MG tablet Take 25 mg by mouth daily.    . ticagrelor (BRILINTA) 90 MG TABS tablet Take 90 mg by mouth 2 (two) times daily.    . tiZANidine (ZANAFLEX) 2 MG tablet Take 1 tablet (2 mg total) by mouth 2 (two) times daily. 60 tablet 3  . Vitamin D, Ergocalciferol, (DRISDOL) 50000 units CAPS capsule Take 50,000 Units by mouth every Sunday.    . Zinc 50 MG CAPS Take 50 mg by mouth every morning.     . carbidopa-levodopa (SINEMET) 25-100 MG per tablet Take 1 tablet by mouth 2 (two) times daily.      No current facility-administered medications for this visit.     Allergies  Allergen Reactions  . Crestor [Rosuvastatin Calcium] Other (See Comments)    Dizziness, couldn't walk  . Sulfa Antibiotics Anaphylaxis  . Pravastatin Other (See Comments)  . Rosuvastatin Other (See Comments)    dizziness  . Amoxicillin Hives  . Chlorphen-Phenyleph-Asa Rash  . Ibuprofen Hives  . Naldecon Senior [Guaifenesin] Hives    REVIEW OF SYSTEMS (negative unless checked):   Cardiac:  [] Chest pain or chest pressure? [] Shortness of breath upon activity? [] Shortness of breath when lying flat? [] Irregular heart rhythm?  Vascular:  [] Pain in calf, thigh, or hip brought on by walking? [] Pain in feet at night that wakes you up from your sleep? [] Blood clot in your veins? [] Leg swelling?  Pulmonary:  [] Oxygen at home? [] Productive cough? [] Wheezing?  Neurologic:  [x] Sudden weakness in arms or legs? [x] Sudden numbness in arms or legs? [] Sudden onset of difficult speaking or slurred speech? [] Temporary loss of vision in one eye? [x] Problems with  dizziness?  Gastrointestinal:  [] Blood in stool? [] Vomited blood?  Genitourinary:  [] Burning when urinating? [] Blood in urine?  Psychiatric:  [] Major depression  Hematologic:  [] Bleeding problems? [] Problems with blood clotting?  Dermatologic:  [] Rashes or ulcers?  Constitutional:  []   Fever or chills?  Ear/Nose/Throat:  [] Change in hearing? [] Nose bleeds? [] Sore throat?  Musculoskeletal:  [] Back pain? [] Joint pain? [] Muscle pain?   Physical Examination   Vitals:   11/06/16 1120  BP: 112/68  Pulse: 61  Resp: 18  Temp: 97.6 F (36.4 C)  TempSrc: Oral  SpO2: 98%  Weight: 205 lb (93 kg)  Height: 4' 11" (1.499 m)    Body mass index is 41.4 kg/m.  General Alert, O x 3, Obese, NAD, sitted in wheelchair  Head [place]/AT,    Ear/Nose/Throat Hearing grossly intact, nares without erythema or drainage, oropharynx without Erythema or Exudate, Mallampati score: 3, Dentition intact  Eyes PERRLA, EOMI,    Neck Supple, mid-line trachea,    Pulmonary Sym exp, good B air movt, CTA B  Cardiac RRR, Nl S1, S2, Murmur present: holosystolic murmur, No rubs, No S3,S4  Vascular Vessel Right Left  Radial Palpable Palpable  Brachial Palpable Palpable  Carotid Palpable, No Bruit Palpable, No Bruit  Aorta Not palpable due to pannus N/A  Femoral Not palpable due to pannus Not palpable due to pannus  Popliteal Not palpable Not palpable  PT Not palpable Not palpable  DP Not palpable Not palpable    Gastrointestinal soft, non-distended, non-tender to palpation, No guarding or rebound, no HSM, no masses, no CVAT B, unable to palpate aorta due to pannus,   Musculoskeletal LUE M/S 5/5, R MS testing limited due to stroke and rotator cuff injury, gait not tested, in wheel chair, Extremities without ischemic changes  , No edema present, 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   Medical Decision Making   Roberta Bryant is a 66 y.o. 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 Risk, benefits, and alternatives to access surgery were discussed.   The patient is aware the risks include but are not limited to: bleeding, infection, steal syndrome, nerve damage, ischemic monomelic neuropathy, thrombosis, failure to mature, need for additional procedures, death and stroke.   The patient agrees to proceed forward with the procedure. The patient has significant cardiac issue reportedly, so will likely have to be done with deep MAC and local anesthesia.  I would not delay intervention given the bleeding risks.  The patient has agreed to proceed with the above procedure which will be scheduled 6 JUN 18.   Demari Gales, MD, FACS [facility] Office: [phone] Pager: [phone]  11/06/2016, 11:58 AM    

## 2016-11-10 ENCOUNTER — Encounter (HOSPITAL_COMMUNITY): Payer: Self-pay | Admitting: *Deleted

## 2016-11-10 ENCOUNTER — Encounter: Payer: Self-pay | Admitting: Vascular Surgery

## 2016-11-10 MED ORDER — SODIUM CHLORIDE 0.9 % IV SOLN
INTRAVENOUS | Status: DC
  Administered 2016-11-11: 08:00:00 via INTRAVENOUS

## 2016-11-10 MED ORDER — VANCOMYCIN HCL IN DEXTROSE 1-5 GM/200ML-% IV SOLN
1000.0000 mg | INTRAVENOUS | Status: AC
  Administered 2016-11-11: 1000 mg via INTRAVENOUS
  Filled 2016-11-10: qty 200

## 2016-11-10 NOTE — Progress Notes (Signed)
Anesthesia Chart Review:  Pt is a same day work up  Patient is a 66 year old female scheduled for plication of L brachiocephalic AV fistula on 6/0/0459 with [name], M.D.  - PCP is Celedonio Miyamoto, MD - Cardiologist is [name], MD. Last office visit 09/02/16 (notes in care everywhere)  - Neurologist is Margette Fast, MD  PMH includes: CAD (DES to [facility] 01/2016), CHF, HTN, DM, hyperlipidemia, anemia, ESRD on hemodialysis, stroke, childhood asthma. Former smoker. BMI 41.5.   - ED visit 10/08/16 for dizziness and hypotension at dialysis  Medications include: ASA 81 mg, carbidopa-levodopa, carvedilol, Zetia, fenofibrate, levothyroxine, Prilosec, sitagliptin, Brilinta. Dr. Jimmie Molly in cardiologist's office gave ok to hold Brilinta prior to surgery per Arbie Cookey in Dr. Lianne Moris office. Last dose 11/06/16.   Labs from ED visit 10/08/16 reviewed.  Renal function consistent with ESRD; otherwise CBC and BMET acceptable for surgery.   - PT and PTT will be obtained DOS.   CXR 08/13/16:  1. Chronic appearing interstitial changes without definite acute or superimposed abnormality. 2. Borderline cardiomegaly  EKG 10/08/16: Sinus rhythm. Probable left atrial enlargement. LVH with secondary repolarization abnormality. Baseline wander in lead(s) II III aVF  Carotid duplex 09/11/16:  1. Bilateral carotid bifurcation and proximal ICA plaque, resulting in less than 50% diameter stenosis. 2.  Antegrade bilateral vertebral arterial flow.  Cardiac cath 01/06/16 (care everywhere): 1. Severe stenosis of the left main coronary artery. S/p DES to LM 01/08/16 (60% stenosis reduced to 10%)  2. Otherwise nonobstructive CAD (LAD, CX, and RCA with <20%)  If labs acceptable DOS, I anticipate patient can proceed as scheduled.  Willeen Cass, FNP-BC [facility]/Anesthesiology Phone: [phone] 11/10/2016 4:38 PM

## 2016-11-10 NOTE — Anesthesia Preprocedure Evaluation (Addendum)
Anesthesia Evaluation  Patient identified by MRN, date of birth, ID band Patient awake    Reviewed: Allergy & Precautions, NPO status , Patient's Chart, lab work & pertinent test results  History of Anesthesia Complications Negative for: history of anesthetic complications  Airway Mallampati: II  TM Distance: >3 FB Neck ROM: Full    Dental no notable dental hx. (+) Dental Advisory Given   Pulmonary former smoker,    Pulmonary exam normal        Cardiovascular hypertension, Pt. on home beta blockers + Past MI  negative cardio ROS Normal cardiovascular exam     Neuro/Psych TIAnegative psych ROS   GI/Hepatic Neg liver ROS, GERD  ,  Endo/Other  diabetes  Renal/GU      Musculoskeletal negative musculoskeletal ROS (+)   Abdominal   Peds  Hematology negative hematology ROS (+)   Anesthesia Other Findings Day of surgery medications reviewed with the patient.  Reproductive/Obstetrics                            Anesthesia Physical Anesthesia Plan  ASA: III  Anesthesia Plan: General   Post-op Pain Management:    Induction: Intravenous  PONV Risk Score and Plan: 4 or greater and Ondansetron, Dexamethasone, Scopolamine patch - Pre-op and Diphenhydramine  Airway Management Planned: LMA  Additional Equipment:   Intra-op Plan:   Post-operative Plan: Extubation in OR  Informed Consent: I have reviewed the patients History and Physical, chart, labs and discussed the procedure including the risks, benefits and alternatives for the proposed anesthesia with the patient or authorized representative who has indicated his/her understanding and acceptance.   Dental advisory given  Plan Discussed with: CRNA and Anesthesiologist  Anesthesia Plan Comments:        Anesthesia Quick Evaluation

## 2016-11-11 ENCOUNTER — Ambulatory Visit (HOSPITAL_COMMUNITY): Payer: Medicare Other | Admitting: Certified Registered Nurse Anesthetist

## 2016-11-11 ENCOUNTER — Encounter (HOSPITAL_COMMUNITY): Payer: Self-pay | Admitting: *Deleted

## 2016-11-11 ENCOUNTER — Ambulatory Visit (INDEPENDENT_AMBULATORY_CARE_PROVIDER_SITE_OTHER): Payer: Medicare Other | Admitting: Orthopaedic Surgery

## 2016-11-11 ENCOUNTER — Encounter (HOSPITAL_COMMUNITY)
Admission: RE | Disposition: A | Discharge status: Home / Self Care | Payer: Self-pay | Source: Ambulatory Visit | Attending: Vascular Surgery

## 2016-11-11 ENCOUNTER — Ambulatory Visit (HOSPITAL_COMMUNITY)
Admission: RE | Admit: 2016-11-11 | Discharge: 2016-11-11 | Disposition: A | Discharge status: Home / Self Care | Payer: Medicare Other | Source: Ambulatory Visit | Attending: Vascular Surgery | Admitting: Vascular Surgery

## 2016-11-11 DIAGNOSIS — K219 Gastro-esophageal reflux disease without esophagitis: Secondary | ICD-10-CM | POA: Diagnosis not present

## 2016-11-11 DIAGNOSIS — E1122 Type 2 diabetes mellitus with diabetic chronic kidney disease: Secondary | ICD-10-CM | POA: Diagnosis not present

## 2016-11-11 DIAGNOSIS — F1290 Cannabis use, unspecified, uncomplicated: Secondary | ICD-10-CM | POA: Insufficient documentation

## 2016-11-11 DIAGNOSIS — Z992 Dependence on renal dialysis: Secondary | ICD-10-CM

## 2016-11-11 DIAGNOSIS — Z880 Allergy status to penicillin: Secondary | ICD-10-CM | POA: Insufficient documentation

## 2016-11-11 DIAGNOSIS — I252 Old myocardial infarction: Secondary | ICD-10-CM | POA: Insufficient documentation

## 2016-11-11 DIAGNOSIS — Z833 Family history of diabetes mellitus: Secondary | ICD-10-CM | POA: Diagnosis not present

## 2016-11-11 DIAGNOSIS — K589 Irritable bowel syndrome without diarrhea: Secondary | ICD-10-CM | POA: Insufficient documentation

## 2016-11-11 DIAGNOSIS — E785 Hyperlipidemia, unspecified: Secondary | ICD-10-CM | POA: Diagnosis not present

## 2016-11-11 DIAGNOSIS — Z7982 Long term (current) use of aspirin: Secondary | ICD-10-CM | POA: Insufficient documentation

## 2016-11-11 DIAGNOSIS — Z79899 Other long term (current) drug therapy: Secondary | ICD-10-CM | POA: Insufficient documentation

## 2016-11-11 DIAGNOSIS — Z881 Allergy status to other antibiotic agents status: Secondary | ICD-10-CM | POA: Insufficient documentation

## 2016-11-11 DIAGNOSIS — X58XXXA Exposure to other specified factors, initial encounter: Secondary | ICD-10-CM | POA: Diagnosis not present

## 2016-11-11 DIAGNOSIS — I509 Heart failure, unspecified: Secondary | ICD-10-CM | POA: Diagnosis not present

## 2016-11-11 DIAGNOSIS — Z888 Allergy status to other drugs, medicaments and biological substances status: Secondary | ICD-10-CM | POA: Insufficient documentation

## 2016-11-11 DIAGNOSIS — Z882 Allergy status to sulfonamides status: Secondary | ICD-10-CM | POA: Insufficient documentation

## 2016-11-11 DIAGNOSIS — E039 Hypothyroidism, unspecified: Secondary | ICD-10-CM | POA: Diagnosis not present

## 2016-11-11 DIAGNOSIS — N186 End stage renal disease: Secondary | ICD-10-CM | POA: Diagnosis not present

## 2016-11-11 DIAGNOSIS — Z87891 Personal history of nicotine dependence: Secondary | ICD-10-CM | POA: Insufficient documentation

## 2016-11-11 DIAGNOSIS — Z8673 Personal history of transient ischemic attack (TIA), and cerebral infarction without residual deficits: Secondary | ICD-10-CM | POA: Insufficient documentation

## 2016-11-11 DIAGNOSIS — M1990 Unspecified osteoarthritis, unspecified site: Secondary | ICD-10-CM | POA: Diagnosis not present

## 2016-11-11 DIAGNOSIS — Z9049 Acquired absence of other specified parts of digestive tract: Secondary | ICD-10-CM | POA: Insufficient documentation

## 2016-11-11 DIAGNOSIS — R269 Unspecified abnormalities of gait and mobility: Secondary | ICD-10-CM | POA: Insufficient documentation

## 2016-11-11 DIAGNOSIS — Z955 Presence of coronary angioplasty implant and graft: Secondary | ICD-10-CM | POA: Insufficient documentation

## 2016-11-11 DIAGNOSIS — E892 Postprocedural hypoparathyroidism: Secondary | ICD-10-CM | POA: Insufficient documentation

## 2016-11-11 DIAGNOSIS — Z820 Family history of epilepsy and other diseases of the nervous system: Secondary | ICD-10-CM | POA: Insufficient documentation

## 2016-11-11 DIAGNOSIS — T82898A Other specified complication of vascular prosthetic devices, implants and grafts, initial encounter: Secondary | ICD-10-CM | POA: Diagnosis not present

## 2016-11-11 DIAGNOSIS — Z811 Family history of alcohol abuse and dependence: Secondary | ICD-10-CM | POA: Diagnosis not present

## 2016-11-11 DIAGNOSIS — R51 Headache: Secondary | ICD-10-CM | POA: Diagnosis not present

## 2016-11-11 DIAGNOSIS — I132 Hypertensive heart and chronic kidney disease with heart failure and with stage 5 chronic kidney disease, or end stage renal disease: Secondary | ICD-10-CM | POA: Insufficient documentation

## 2016-11-11 DIAGNOSIS — Z809 Family history of malignant neoplasm, unspecified: Secondary | ICD-10-CM | POA: Insufficient documentation

## 2016-11-11 HISTORY — DX: Pneumonia, unspecified organism: J18.9

## 2016-11-11 HISTORY — PX: REVISON OF ARTERIOVENOUS FISTULA: SHX6074

## 2016-11-11 LAB — POCT I-STAT 4, (NA,K, GLUC, HGB,HCT)
Glucose, Bld: 113 mg/dL — ABNORMAL HIGH (ref 65–99)
HEMATOCRIT: 35 % — AB (ref 36.0–46.0)
Hemoglobin: 11.9 g/dL — ABNORMAL LOW (ref 12.0–15.0)
Potassium: 4.3 mmol/L (ref 3.5–5.1)
SODIUM: 136 mmol/L (ref 135–145)

## 2016-11-11 LAB — GLUCOSE, CAPILLARY: GLUCOSE-CAPILLARY: 129 mg/dL — AB (ref 65–99)

## 2016-11-11 LAB — PROTIME-INR
INR: 1.06
PROTHROMBIN TIME: 13.8 s (ref 11.4–15.2)

## 2016-11-11 LAB — APTT: aPTT: 26 seconds (ref 24–36)

## 2016-11-11 SURGERY — REVISON OF ARTERIOVENOUS FISTULA
Anesthesia: General | Site: Arm Upper | Laterality: Left

## 2016-11-11 MED ORDER — SUCCINYLCHOLINE CHLORIDE 20 MG/ML IJ SOLN
INTRAMUSCULAR | Status: DC | PRN
  Administered 2016-11-11: 120 mg via INTRAVENOUS

## 2016-11-11 MED ORDER — EPHEDRINE 5 MG/ML INJ
INTRAVENOUS | Status: AC
  Filled 2016-11-11: qty 20

## 2016-11-11 MED ORDER — EPHEDRINE SULFATE-NACL 50-0.9 MG/10ML-% IV SOSY
PREFILLED_SYRINGE | INTRAVENOUS | Status: DC | PRN
  Administered 2016-11-11: 15 mg via INTRAVENOUS
  Administered 2016-11-11 (×3): 10 mg via INTRAVENOUS

## 2016-11-11 MED ORDER — FENOFIBRATE 120 MG PO TABS: 120.0000 mg | ORAL_TABLET | Freq: Every day | ORAL | Status: DC

## 2016-11-11 MED ORDER — OXYCODONE HCL 5 MG PO TABS
5.0000 mg | ORAL_TABLET | Freq: Every day | ORAL | 0 refills | Status: DC | PRN
Start: 2016-11-11 — End: 2017-06-23

## 2016-11-11 MED ORDER — SCOPOLAMINE 1 MG/3DAYS TD PT72
1.0000 | MEDICATED_PATCH | TRANSDERMAL | Status: DC
  Administered 2016-11-11: 1.5 mg via TRANSDERMAL
  Filled 2016-11-11: qty 1

## 2016-11-11 MED ORDER — PROPOFOL 10 MG/ML IV BOLUS
INTRAVENOUS | Status: DC | PRN
  Administered 2016-11-11: 30 mg via INTRAVENOUS
  Administered 2016-11-11: 120 mg via INTRAVENOUS

## 2016-11-11 MED ORDER — PROMETHAZINE HCL 25 MG/ML IJ SOLN: 6.2500 mg | INTRAMUSCULAR | Status: DC | PRN

## 2016-11-11 MED ORDER — SUCCINYLCHOLINE CHLORIDE 200 MG/10ML IV SOSY
PREFILLED_SYRINGE | INTRAVENOUS | Status: AC
  Filled 2016-11-11: qty 10

## 2016-11-11 MED ORDER — MIDAZOLAM HCL 2 MG/2ML IJ SOLN
INTRAMUSCULAR | Status: AC
  Filled 2016-11-11: qty 2

## 2016-11-11 MED ORDER — OXYCODONE HCL 5 MG PO TABS
ORAL_TABLET | ORAL | Status: AC
  Filled 2016-11-11: qty 1

## 2016-11-11 MED ORDER — LIDOCAINE 2% (20 MG/ML) 5 ML SYRINGE
INTRAMUSCULAR | Status: AC
  Filled 2016-11-11: qty 5

## 2016-11-11 MED ORDER — HYDROMORPHONE HCL 1 MG/ML IJ SOLN: 0.2500 mg | INTRAMUSCULAR | Status: DC | PRN

## 2016-11-11 MED ORDER — ONDANSETRON HCL 4 MG/2ML IJ SOLN
INTRAMUSCULAR | Status: DC | PRN
  Administered 2016-11-11: 4 mg via INTRAVENOUS

## 2016-11-11 MED ORDER — FENOFIBRATE 120 MG PO TABS: 145.0000 mg | ORAL_TABLET | Freq: Every day | ORAL | Status: DC

## 2016-11-11 MED ORDER — LIDOCAINE 2% (20 MG/ML) 5 ML SYRINGE
INTRAMUSCULAR | Status: DC | PRN
  Administered 2016-11-11: 100 mg via INTRAVENOUS

## 2016-11-11 MED ORDER — DEXAMETHASONE SODIUM PHOSPHATE 10 MG/ML IJ SOLN
INTRAMUSCULAR | Status: AC
  Filled 2016-11-11: qty 1

## 2016-11-11 MED ORDER — PROPOFOL 10 MG/ML IV BOLUS
INTRAVENOUS | Status: AC
  Filled 2016-11-11: qty 20

## 2016-11-11 MED ORDER — ROCURONIUM BROMIDE 10 MG/ML (PF) SYRINGE
PREFILLED_SYRINGE | INTRAVENOUS | Status: AC
  Filled 2016-11-11: qty 10

## 2016-11-11 MED ORDER — 0.9 % SODIUM CHLORIDE (POUR BTL) OPTIME
TOPICAL | Status: DC | PRN
  Administered 2016-11-11: 1000 mL

## 2016-11-11 MED ORDER — HEPARIN SODIUM (PORCINE) 1000 UNIT/ML IJ SOLN
INTRAMUSCULAR | Status: AC
  Filled 2016-11-11: qty 1

## 2016-11-11 MED ORDER — PROTAMINE SULFATE 10 MG/ML IV SOLN
INTRAVENOUS | Status: DC | PRN
  Administered 2016-11-11 (×3): 20 mg via INTRAVENOUS

## 2016-11-11 MED ORDER — FENTANYL CITRATE (PF) 250 MCG/5ML IJ SOLN
INTRAMUSCULAR | Status: AC
  Filled 2016-11-11: qty 5

## 2016-11-11 MED ORDER — HEPARIN SODIUM (PORCINE) 1000 UNIT/ML IJ SOLN
INTRAMUSCULAR | Status: DC | PRN
  Administered 2016-11-11: 9000 [IU] via INTRAVENOUS

## 2016-11-11 MED ORDER — FENTANYL CITRATE (PF) 100 MCG/2ML IJ SOLN
INTRAMUSCULAR | Status: DC | PRN
Start: 2016-11-11 — End: 2016-11-11
  Administered 2016-11-11: 100 ug via INTRAVENOUS
  Administered 2016-11-11: 50 ug via INTRAVENOUS

## 2016-11-11 MED ORDER — PHENYLEPHRINE 40 MCG/ML (10ML) SYRINGE FOR IV PUSH (FOR BLOOD PRESSURE SUPPORT)
PREFILLED_SYRINGE | INTRAVENOUS | Status: AC
  Filled 2016-11-11: qty 10

## 2016-11-11 MED ORDER — PROTAMINE SULFATE 10 MG/ML IV SOLN
INTRAVENOUS | Status: AC
  Filled 2016-11-11: qty 5

## 2016-11-11 MED ORDER — LIDOCAINE HCL 1 % IJ SOLN
INTRAMUSCULAR | Status: AC
  Filled 2016-11-11: qty 20

## 2016-11-11 MED ORDER — OXYCODONE HCL 5 MG PO TABS
5.0000 mg | ORAL_TABLET | Freq: Once | ORAL | Status: AC
  Administered 2016-11-11: 5 mg via ORAL

## 2016-11-11 MED ORDER — ONDANSETRON HCL 4 MG/2ML IJ SOLN
INTRAMUSCULAR | Status: AC
  Filled 2016-11-11: qty 2

## 2016-11-11 MED ORDER — TICAGRELOR 90 MG PO TABS: 90.0000 mg | ORAL_TABLET | Freq: Two times a day (BID) | ORAL | Status: DC

## 2016-11-11 MED ORDER — SODIUM CHLORIDE 0.9 % IV SOLN
INTRAVENOUS | Status: DC | PRN
  Administered 2016-11-11: 09:00:00 500 mL

## 2016-11-11 MED ORDER — MIDAZOLAM HCL 2 MG/2ML IJ SOLN
INTRAMUSCULAR | Status: DC | PRN
  Administered 2016-11-11: 2 mg via INTRAVENOUS

## 2016-11-11 MED ORDER — DEXAMETHASONE SODIUM PHOSPHATE 10 MG/ML IJ SOLN
INTRAMUSCULAR | Status: DC | PRN
  Administered 2016-11-11: 10 mg via INTRAVENOUS

## 2016-11-11 MED ORDER — HEMOSTATIC AGENTS (NO CHARGE) OPTIME
TOPICAL | Status: DC | PRN
  Administered 2016-11-11: 1 via TOPICAL

## 2016-11-11 SURGICAL SUPPLY — 45 items
ARMBAND PINK RESTRICT EXTREMIT (MISCELLANEOUS) ×3 IMPLANT
BANDAGE ELASTIC 4 VELCRO ST LF (GAUZE/BANDAGES/DRESSINGS) IMPLANT
BANDAGE ESMARK 6X9 LF (GAUZE/BANDAGES/DRESSINGS) ×1 IMPLANT
BNDG ESMARK 6X9 LF (GAUZE/BANDAGES/DRESSINGS) ×3
CANISTER SUCT 3000ML PPV (MISCELLANEOUS) ×3 IMPLANT
CLIP TI MEDIUM 6 (CLIP) ×3 IMPLANT
CLIP TI WIDE RED SMALL 6 (CLIP) ×3 IMPLANT
COVER PROBE W GEL 5X96 (DRAPES) IMPLANT
CUFF TOURNIQUET SINGLE 24IN (TOURNIQUET CUFF) ×3 IMPLANT
DECANTER SPIKE VIAL GLASS SM (MISCELLANEOUS) ×3 IMPLANT
DERMABOND ADVANCED (GAUZE/BANDAGES/DRESSINGS) ×2
DERMABOND ADVANCED .7 DNX12 (GAUZE/BANDAGES/DRESSINGS) ×1 IMPLANT
DRAIN PENROSE 1/2X12 LTX STRL (WOUND CARE) IMPLANT
ELECT REM PT RETURN 9FT ADLT (ELECTROSURGICAL) ×3
ELECTRODE REM PT RTRN 9FT ADLT (ELECTROSURGICAL) ×1 IMPLANT
GAUZE SPONGE 4X4 12PLY STRL LF (GAUZE/BANDAGES/DRESSINGS) ×3 IMPLANT
GLOVE BIO SURGEON STRL SZ 6.5 (GLOVE) ×2 IMPLANT
GLOVE BIO SURGEON STRL SZ7 (GLOVE) ×3 IMPLANT
GLOVE BIO SURGEON STRL SZ8 (GLOVE) ×3 IMPLANT
GLOVE BIO SURGEONS STRL SZ 6.5 (GLOVE) ×1
GLOVE BIOGEL PI IND STRL 6.5 (GLOVE) ×1 IMPLANT
GLOVE BIOGEL PI IND STRL 7.0 (GLOVE) ×1 IMPLANT
GLOVE BIOGEL PI IND STRL 7.5 (GLOVE) ×1 IMPLANT
GLOVE BIOGEL PI INDICATOR 6.5 (GLOVE) ×2
GLOVE BIOGEL PI INDICATOR 7.0 (GLOVE) ×2
GLOVE BIOGEL PI INDICATOR 7.5 (GLOVE) ×2
GLOVE ECLIPSE 6.5 STRL STRAW (GLOVE) ×3 IMPLANT
GOWN STRL REUS W/ TWL LRG LVL3 (GOWN DISPOSABLE) ×3 IMPLANT
GOWN STRL REUS W/TWL LRG LVL3 (GOWN DISPOSABLE) ×6
HEMOSTAT SPONGE AVITENE ULTRA (HEMOSTASIS) ×3 IMPLANT
KIT BASIN OR (CUSTOM PROCEDURE TRAY) ×3 IMPLANT
KIT ROOM TURNOVER OR (KITS) ×3 IMPLANT
NS IRRIG 1000ML POUR BTL (IV SOLUTION) ×3 IMPLANT
PACK CV ACCESS (CUSTOM PROCEDURE TRAY) ×3 IMPLANT
PAD ARMBOARD 7.5X6 YLW CONV (MISCELLANEOUS) ×6 IMPLANT
STAPLER VISISTAT 35W (STAPLE) IMPLANT
SUT MNCRL AB 4-0 PS2 18 (SUTURE) ×3 IMPLANT
SUT PROLENE 5 0 C 1 24 (SUTURE) ×3 IMPLANT
SUT PROLENE 6 0 BV (SUTURE) IMPLANT
SUT PROLENE 7 0 BV 1 (SUTURE) ×3 IMPLANT
SUT VIC AB 3-0 SH 27 (SUTURE) ×2
SUT VIC AB 3-0 SH 27X BRD (SUTURE) ×1 IMPLANT
TAPE CLOTH SURG 4X10 WHT LF (GAUZE/BANDAGES/DRESSINGS) ×3 IMPLANT
UNDERPAD 30X30 (UNDERPADS AND DIAPERS) ×3 IMPLANT
WATER STERILE IRR 1000ML POUR (IV SOLUTION) ×3 IMPLANT

## 2016-11-11 NOTE — Op Note (Addendum)
    OPERATIVE NOTE   PROCEDURE: 1. Plication of left brachiocephalic arteriovenous fistula   PRE-OPERATIVE DIAGNOSIS: Pseudoaneurysm degeneration of arteriovenous fistula   POST-OPERATIVE DIAGNOSIS: same as above   SURGEON: [name], MD  ASSISTANT(S): Silva Bandy, PAC   ANESTHESIA: general  ESTIMATED BLOOD LOSS: 50 cc  FINDING(S): 1. 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 Palpable thrill at end of the case  SPECIMEN(S):  none  INDICATIONS:   Roberta Bryant is a 66 y.o. 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  In order to salvage the fistula and decrease the bleeding complication risks, I recommended plication of the fistula.  Risk, benefits, and alternatives to access surgery were discussed.  The patient is aware the risks include but are not limited to: bleeding, infection, steal syndrome, nerve damage, ischemic monomelic neuropathy, failure to mature, need for additional procedures, thrombosis of the access, death and stroke.  The patient agrees to proceed forward with the procedure.   DESCRIPTION: After obtaining full informed written consent, the patient was brought back to the operating room and placed supine upon the operating table.  The patient received IV antibiotics prior to induction.  After obtaining adequate anesthesia, the patient was prepped and draped in the standard fashion for: left access procedure.  The patient was given 9000 units of Heparin intravenously, which was a therapeutic bolus.  After waiting 5 minutes, a sterile tourniquet was applied to the upper arm and inflated to 250 mm Hg after exsanguinating the arm with an Esmark bandage.    An elliptical incision was made around the skin overlying the pseudoaneurysmal segment in the fistula.  I dissected the skin away from the pseudoaneurysm with electrocautery.  Eventually, I was able to skeletonize the pseudoaneurysm.  I sharply entered  into the pseudoaneurysm to verify the position of of the pseudoaneurysm.  I sharply tailored the redundant pseudoaneurysm wall.  The residual fistula wall was felt to be adequately strong to repair.  I repaired the fistula with a running stitch of 5-0 Prolene, taking care to take large bites of the fistula wall.  Prior to completing this repair, I passed a  5 mm dilator proximally and distally: no resistance was encountered.  The repair was completed in the usual fashion.    At this point, the patient was given 60 mg of Protamine intravenously to reverse the anticoagulation.  Avitene were applied to the incision.  Bleeding points were controlled with suture ligature and electrocautery and Avitene.  After a few more round of Avitene, no further active bleeding was noted.  I reapproximated the subcutaneous tissue immediately above the fistula with a running stitch of 3-0 Vicryl.  The skin was reapproximated with a running subcuticular of 4-0 Monocryl.  The skin was cleaned, dried, and the skin bandaged with sterile bandages. .  The patient continued to have a palpable thrill in the fistula.   COMPLICATIONS: none  CONDITION: stable   [name], MD, [facility] [place] Office: [phone] Pager: [phone]  11/11/2016, 9:39 AM

## 2016-11-11 NOTE — Anesthesia Postprocedure Evaluation (Signed)
Anesthesia Post Note  Patient: Roberta Bryant  Procedure(s) Performed: Procedure(s) (LRB): PLICATION OF LEFT BRACHIOCEPHALIC  ARTERIOVENOUS FISTULA (Left)     Patient location during evaluation: PACU Anesthesia Type: General Level of consciousness: sedated Pain management: pain level controlled Vital Signs Assessment: post-procedure vital signs reviewed and stable Respiratory status: spontaneous breathing and respiratory function stable Cardiovascular status: stable Anesthetic complications: no    Last Vitals:  Vitals:   11/11/16 1005 11/11/16 1013  BP: (!) 146/81   Pulse: 75   Resp: 18   Temp:  36.7 C    Last Pain:  Vitals:   11/11/16 1010  TempSrc:   PainSc: 4                  Albertine Lafoy DANIEL

## 2016-11-11 NOTE — Transfer of Care (Signed)
Immediate Anesthesia Transfer of Care Note  Patient: Roberta Bryant  Procedure(s) Performed: Procedure(s): PLICATION OF LEFT BRACHIOCEPHALIC  ARTERIOVENOUS FISTULA (Left)  Patient Location: PACU  Anesthesia Type:General  Level of Consciousness: awake, alert , oriented and patient cooperative  Airway & Oxygen Therapy: Patient Spontanous Breathing and Patient connected to face mask oxygen  Post-op Assessment: Report given to RN, Post -op Vital signs reviewed and stable and Patient moving all extremities X 4  Post vital signs: Reviewed and stable  Last Vitals:  Vitals:   11/11/16 0710 11/11/16 0945  BP: (!) 189/71 (P) 133/74  Pulse: 64   Resp: 18   Temp: 37.1 C (P) 36.6 C    Last Pain:  Vitals:   11/11/16 0710  TempSrc: Oral      Patients Stated Pain Goal: 2 (21/74/71 5953)  Complications: No apparent anesthesia complications

## 2016-11-11 NOTE — H&P (View-Only) (Signed)
Requested by:  Valaria Good, PA-C [address] [place], [place] [address]  Reason for consultation: Left AVF aneurysm   History of Present Illness   Roberta Bryant is a 66 y.o. 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  Pt denies any steal sx from her L BC AVF but she does not bleeding from the L [facility] AVF.  She notes that her HD tech continue to cannulate the same site.  A few weeks ago the bump on the L arm AVF became red.  It has seen then resolved.  The patient denies any fever or chills.  Past Medical History:  Diagnosis Date  . Anemia   . CHF (congestive heart failure) ([place])   . Childhood asthma   . Chronic upper back pain   . ESRD (end stage renal disease) on dialysis ([place])    "TTS; [facility] in [place]" (10/30/2014)  . Gait abnormality 08/28/2016  . GERD (gastroesophageal reflux disease)   . Headache    "a few times/week" (10/30/2014)  . History of blood transfusion 1996   "related to menses"  . Hyperlipidemia   . Hypertension   . Hypothyroidism   . IBS (irritable bowel syndrome)   . Myocardial infarction ([place]) 2004  . Osteoarthritis   . Stroke ([place])   . Type II diabetes mellitus ([place])     Past Surgical History:  Procedure Laterality Date  . APPENDECTOMY    . AV FISTULA PLACEMENT Left 12/03/2006   "forearm"  . AV FISTULA REPAIR Left 11/25/2009   "forearm"  . CHOLECYSTECTOMY OPEN  1970's  . CORONARY ANGIOPLASTY    . CORONARY ANGIOPLASTY WITH STENT PLACEMENT    . HERNIA REPAIR    . INSERTION OF DIALYSIS CATHETER Right 2010   "chest"  . PARATHYROIDECTOMY  2010?   Parathyroid autotransplantation   . REVISON OF ARTERIOVENOUS FISTULA Left 12/04/2013   Procedure: REVISON OF ARTERIOVENOUS FISTULA;  Surgeon: Rosetta Posner, MD;  Location: [facility];  Service: Vascular;  Laterality: Left;  . TUBAL LIGATION  1989  . UMBILICAL HERNIA REPAIR  1990's X 2    Social History   Social History  . Marital status:  Divorced    Spouse name: N/A  . Number of children: 4  . Years of education: Some college   Occupational History  . Disabled    Social History Main Topics  . Smoking status: Former Smoker    Years: 3.00    Types: Cigarettes  . Smokeless tobacco: Never Used     Comment: "stopped smoking in the 1970's"  . Alcohol use No  . Drug use: Yes    Frequency: 7.0 times per week    Types: Marijuana     Comment: 10/30/2014 "maybe once/wk"  . Sexual activity: Not Currently   Other Topics Concern  . Not on file   Social History Narrative   Lives at home with sons   Caffeine use: Coffee-decaf   Right-handed    Family History  Problem Relation Age of Onset  . Epilepsy Mother   . Diabetes Mother   . Cancer Mother   . Alcohol abuse Father     Current Outpatient Prescriptions  Medication Sig Dispense Refill  . acetaminophen (TYLENOL) 325 MG tablet Take 650 mg by mouth every 6 (six) hours as needed for moderate pain or headache.     Marland Kitchen aspirin EC 81 MG tablet Take 81 mg by mouth every morning.    Marland Kitchen  calcium acetate (PHOSLO) 667 MG capsule Take 2-4 capsules by mouth 4 (four) times daily. 4 with meals and 2 with snacks  11  . carvedilol (COREG) 12.5 MG tablet Take 12.5 mg by mouth at bedtime.  2  . docusate sodium (COLACE) 100 MG capsule Take 100 mg by mouth 2 (two) times daily.    Marland Kitchen ezetimibe (ZETIA) 10 MG tablet Take 10 mg by mouth daily.  1  . fenofibrate 120 MG TABS Take 1 tablet (120 mg total) by mouth daily. (Patient taking differently: Take 145 mg by mouth daily. ) 30 tablet 0  . finasteride (PROSCAR) 5 MG tablet Take 2.5 mg by mouth daily.    Marland Kitchen levothyroxine (SYNTHROID, LEVOTHROID) 50 MCG tablet Take 50 mcg by mouth daily before breakfast.     . loperamide (IMODIUM A-D) 2 MG tablet Take 2 mg by mouth 4 (four) times daily as needed for diarrhea or loose stools.    . meclizine (ANTIVERT) 25 MG tablet Take 12.5-25 mg by mouth 3 (three) times daily as needed for dizziness.     .  multivitamin (RENA-VIT) TABS tablet Take 1 tablet by mouth daily.     Marland Kitchen omeprazole (PRILOSEC) 20 MG capsule Take 20 mg by mouth 2 (two) times daily before a meal.    . prochlorperazine (COMPAZINE) 5 MG tablet Take 5 mg by mouth every 6 (six) hours as needed for nausea or vomiting (for dialysis).    . sitaGLIPtin (JANUVIA) 25 MG tablet Take 25 mg by mouth daily.    . ticagrelor (BRILINTA) 90 MG TABS tablet Take 90 mg by mouth 2 (two) times daily.    Marland Kitchen tiZANidine (ZANAFLEX) 2 MG tablet Take 1 tablet (2 mg total) by mouth 2 (two) times daily. 60 tablet 3  . Vitamin D, Ergocalciferol, (DRISDOL) 50000 units CAPS capsule Take 50,000 Units by mouth every Sunday.    . Zinc 50 MG CAPS Take 50 mg by mouth every morning.     . carbidopa-levodopa (SINEMET) 25-100 MG per tablet Take 1 tablet by mouth 2 (two) times daily.      No current facility-administered medications for this visit.     Allergies  Allergen Reactions  . Crestor [Rosuvastatin Calcium] Other (See Comments)    Dizziness, couldn't walk  . Sulfa Antibiotics Anaphylaxis  . Pravastatin Other (See Comments)  . Rosuvastatin Other (See Comments)    dizziness  . Amoxicillin Hives  . Chlorphen-Phenyleph-Asa Rash  . Ibuprofen Hives  . Nal[place]con Senior [Guaifenesin] Hives    REVIEW OF SYSTEMS (negative unless checked):   Cardiac:  _0  Chest pain or chest pressure? _1  Shortness of breath upon activity? _2  Shortness of breath when lying flat? _3  Irregular heart rhythm?  Vascular:  _4  Pain in calf, thigh, or hip brought on by walking? _5  Pain in feet at night that wakes you up from your sleep? _6  Blood clot in your veins? _7  Leg swelling?  Pulmonary:  _8  Oxygen at home? _9  Productive cough? _10  Wheezing?  Neurologic:  _11  Sudden weakness in arms or legs? _12  Sudden numbness in arms or legs? _13  Sudden onset of difficult speaking or slurred speech? _14  Temporary loss of vision in one eye? _15  Problems with  dizziness?  Gastrointestinal:  _16  Blood in stool? _17  Vomited blood?  Genitourinary:  _18  Burning when urinating? _19  Blood in urine?  Psychiatric:  _20  Major depression  Hematologic:  _21  Bleeding problems? _22  Problems with blood clotting?  Dermatologic:  _23  Rashes or ulcers?  Constitutional:  _24   Fever or chills?  Ear/Nose/Throat:  _0  Change in hearing? _1  Nose bleeds? _2  Sore throat?  Musculoskeletal:  _3  Back pain? _4  Joint pain? _5  Muscle pain?   Physical Examination   Vitals:   11/06/16 1120  BP: 112/68  Pulse: 61  Resp: 18  Temp: 97.6 F (36.4 C)  TempSrc: Oral  SpO2: 98%  Weight: 205 lb (93 kg)  Height: _6  (1.499 m)    Body mass index is 41.4 kg/m.  General Alert, O x 3, Obese, NAD, sitted in wheelchair  Head [place]/AT,    Ear/Nose/Throat Hearing grossly intact, nares without erythema or drainage, oropharynx without Erythema or Exudate, Mallampati score: 3, Dentition intact  Eyes PERRLA, EOMI,    Neck Supple, mid-line trachea,    Pulmonary Sym exp, good B air movt, CTA B  Cardiac RRR, Nl S1, S2, Murmur present: holosystolic murmur, No rubs, No S3,S4  Vascular Vessel Right Left  Radial Palpable Palpable  Brachial Palpable Palpable  Carotid Palpable, No Bruit Palpable, No Bruit  Aorta Not palpable due to pannus N/A  Femoral Not palpable due to pannus Not palpable due to pannus  Popliteal Not palpable Not palpable  PT Not palpable Not palpable  DP Not palpable Not palpable    Gastrointestinal soft, non-distended, non-tender to palpation, No guarding or rebound, no HSM, no masses, no CVAT B, unable to palpate aorta due to pannus,   Musculoskeletal LUE M/S 5/5, R MS testing limited due to stroke and rotator cuff injury, gait not tested, in wheel chair, Extremities without ischemic changes  , No edema present, 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   Medical Decision Making   WILLADEEN COLANTUONO is a 66 y.o. 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 Risk, benefits, and alternatives to access surgery were discussed.   The patient is aware the risks include but are not limited to: bleeding, infection, steal syndrome, nerve damage, ischemic monomelic neuropathy, thrombosis, failure to mature, need for additional procedures, death and stroke.   The patient agrees to proceed forward with the procedure. The patient has significant cardiac issue reportedly, so will likely have to be done with deep MAC and local anesthesia.  I would not delay intervention given the bleeding risks.  The patient has agreed to proceed with the above procedure which will be scheduled 6 JUN 18.   [name], MD, FACS [facility] Office: [phone] Pager: [phone]  11/06/2016, 11:58 AM

## 2016-11-11 NOTE — Anesthesia Procedure Notes (Signed)
Procedure Name: Intubation Date/Time: 11/11/2016 8:41 AM Performed by: Mervyn Gay Pre-anesthesia Checklist: Patient identified, Patient being monitored, Timeout performed, Emergency Drugs available and Suction available Patient Re-evaluated:Patient Re-evaluated prior to inductionOxygen Delivery Method: Circle System Utilized Preoxygenation: Pre-oxygenation with 100% oxygen Intubation Type: IV induction, Rapid sequence and Cricoid Pressure applied Laryngoscope Size: Miller and 3 Grade View: Grade I Tube type: Oral Tube size: 7.5 mm Number of attempts: 1 Airway Equipment and Method: Stylet Placement Confirmation: ETT inserted through vocal cords under direct vision,  positive ETCO2 and breath sounds checked- equal and bilateral Secured at: 21 cm Tube secured with: Tape Dental Injury: Teeth and Oropharynx as per pre-operative assessment

## 2016-11-11 NOTE — Interval H&P Note (Signed)
History and Physical Interval Note:  11/11/2016 8:10 AM  Roberta Bryant  has presented today for surgery, with the diagnosis of End stage renal disease  The various methods of treatment have been discussed with the patient and family. After consideration of risks, benefits and other options for treatment, the patient has consented to  Procedure(s): PLICATION OF LEFT BRACHIOCEPHALIC  ARTERIOVENOUS FISTULA (Left) as a surgical intervention .  The patient's history has been reviewed, patient examined, no change in status, stable for surgery.  I have reviewed the patient's chart and labs.  Questions were answered to the patient's satisfaction.     [name]

## 2016-11-12 ENCOUNTER — Telehealth: Payer: Self-pay | Admitting: Vascular Surgery

## 2016-11-12 ENCOUNTER — Encounter (HOSPITAL_COMMUNITY): Payer: Self-pay | Admitting: Vascular Surgery

## 2016-11-12 NOTE — Telephone Encounter (Signed)
Sched staple removal 11/25/16 at 11:15 and MD 12/16/16 at 1:45. Lm on cell# for pt to confirm appt.

## 2016-11-12 NOTE — Telephone Encounter (Signed)
-----   Message from Mena Goes, RN sent at 11/11/2016  1:01 PM EDT ----- Regarding: 2 and 4 week appts   ----- Message ----- From: Alvia Grove, PA-C Sent: 11/11/2016   9:36 AM To: [facility] Charge Pool  S/p revision left arm fistula 11/11/16  Needs staple removal in 2 weeks, no provider appt needed. Needs office visit in 4 weeks with Dr. Bridgett Larsson.   Thanks Maudie Mercury

## 2016-11-13 ENCOUNTER — Encounter (INDEPENDENT_AMBULATORY_CARE_PROVIDER_SITE_OTHER): Payer: Medicare Other | Admitting: Physical Medicine and Rehabilitation

## 2016-11-16 ENCOUNTER — Encounter: Payer: Self-pay | Admitting: Family

## 2016-11-20 ENCOUNTER — Ambulatory Visit (INDEPENDENT_AMBULATORY_CARE_PROVIDER_SITE_OTHER): Payer: Medicare Other | Admitting: Physician Assistant

## 2016-11-24 ENCOUNTER — Emergency Department (HOSPITAL_COMMUNITY)
Admission: EM | Admit: 2016-11-24 | Discharge: 2016-11-24 | Disposition: A | Discharge status: Home / Self Care | Payer: Medicare Other | Attending: Emergency Medicine | Admitting: Emergency Medicine

## 2016-11-24 ENCOUNTER — Emergency Department (HOSPITAL_COMMUNITY): Payer: Medicare Other

## 2016-11-24 ENCOUNTER — Encounter (HOSPITAL_COMMUNITY): Payer: Self-pay | Admitting: Emergency Medicine

## 2016-11-24 DIAGNOSIS — Z992 Dependence on renal dialysis: Secondary | ICD-10-CM | POA: Insufficient documentation

## 2016-11-24 DIAGNOSIS — Y998 Other external cause status: Secondary | ICD-10-CM | POA: Insufficient documentation

## 2016-11-24 DIAGNOSIS — N186 End stage renal disease: Secondary | ICD-10-CM | POA: Diagnosis not present

## 2016-11-24 DIAGNOSIS — Y92002 Bathroom of unspecified non-institutional (private) residence single-family (private) house as the place of occurrence of the external cause: Secondary | ICD-10-CM | POA: Insufficient documentation

## 2016-11-24 DIAGNOSIS — I110 Hypertensive heart disease with heart failure: Secondary | ICD-10-CM | POA: Insufficient documentation

## 2016-11-24 DIAGNOSIS — S0990XA Unspecified injury of head, initial encounter: Secondary | ICD-10-CM | POA: Diagnosis not present

## 2016-11-24 DIAGNOSIS — R519 Headache, unspecified: Secondary | ICD-10-CM

## 2016-11-24 DIAGNOSIS — S161XXA Strain of muscle, fascia and tendon at neck level, initial encounter: Secondary | ICD-10-CM | POA: Diagnosis not present

## 2016-11-24 DIAGNOSIS — Z87891 Personal history of nicotine dependence: Secondary | ICD-10-CM | POA: Insufficient documentation

## 2016-11-24 DIAGNOSIS — E785 Hyperlipidemia, unspecified: Secondary | ICD-10-CM | POA: Insufficient documentation

## 2016-11-24 DIAGNOSIS — R51 Headache: Secondary | ICD-10-CM | POA: Insufficient documentation

## 2016-11-24 DIAGNOSIS — I252 Old myocardial infarction: Secondary | ICD-10-CM | POA: Diagnosis not present

## 2016-11-24 DIAGNOSIS — W19XXXA Unspecified fall, initial encounter: Secondary | ICD-10-CM

## 2016-11-24 DIAGNOSIS — E039 Hypothyroidism, unspecified: Secondary | ICD-10-CM | POA: Diagnosis not present

## 2016-11-24 DIAGNOSIS — Y9301 Activity, walking, marching and hiking: Secondary | ICD-10-CM | POA: Insufficient documentation

## 2016-11-24 DIAGNOSIS — Z8673 Personal history of transient ischemic attack (TIA), and cerebral infarction without residual deficits: Secondary | ICD-10-CM | POA: Diagnosis not present

## 2016-11-24 DIAGNOSIS — E119 Type 2 diabetes mellitus without complications: Secondary | ICD-10-CM | POA: Diagnosis not present

## 2016-11-24 DIAGNOSIS — W01198A Fall on same level from slipping, tripping and stumbling with subsequent striking against other object, initial encounter: Secondary | ICD-10-CM | POA: Insufficient documentation

## 2016-11-24 DIAGNOSIS — I509 Heart failure, unspecified: Secondary | ICD-10-CM | POA: Insufficient documentation

## 2016-11-24 MED ORDER — OXYCODONE-ACETAMINOPHEN 5-325 MG PO TABS
1.0000 | ORAL_TABLET | Freq: Once | ORAL | Status: AC
  Administered 2016-11-24: 1 via ORAL
  Filled 2016-11-24: qty 1

## 2016-11-24 NOTE — ED Triage Notes (Signed)
Pt here for fall on Saturday; pt takes bilinta; pt dialysis pt with last dialysis today; pt had stroke in March

## 2016-11-24 NOTE — ED Provider Notes (Signed)
Emergency Department Provider Note   I have reviewed the triage vital signs and the nursing notes.   HISTORY  Chief Complaint Fall   HPI Roberta Bryant is a 66 y.o. 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 The patient states she was walking to the bathroom when she lost her balance while sitting back on the commode. She felt the ground hitting the back of her head and neck on the counter. There was no loss of consciousness. No bleeding. She called her sons who were able to help her up. She had some mild lightheadedness and intermittent headache which she's been treating with Tylenol and her home Percocet. She had dialysis today with no acute issues during her session. She has a new health aide to she told about the fall and her headache and decided to present to the emergency department after speaking with them. Patient did not know that she was on a anti-platelet agent.   Past Medical History:  Diagnosis Date  . Anemia   . CHF (congestive heart failure) ([place])   . Childhood asthma   . Chronic upper back pain   . ESRD (end stage renal disease) on dialysis ([place])    "TTS; [facility] in [place]" (10/30/2014)  . Gait abnormality 08/28/2016  . GERD (gastroesophageal reflux disease)   . Headache    "a few times/week" (10/30/2014)  . History of blood transfusion 1996   "related to menses"  . Hyperlipidemia   . Hypertension   . Hypothyroidism   . IBS (irritable bowel syndrome)   . Myocardial infarction ([place]) 2004  . Osteoarthritis   . Pneumonia    "years ago"  . Stroke ([place]) 07/2016   mini stroke , right side of body  . Type II diabetes mellitus [facility])     Patient Active Problem List   Diagnosis Date Noted  . Complete tear of right rotator cuff 10/21/2016  . Gait abnormality 08/28/2016  . Chest pain 11/23/2015  . ESRD on dialysis ([place])   . Type 2  diabetes mellitus with complication ([place])   . Essential hypertension   . Abnormal ECG   . Pain in the chest   . Hypotension 10/30/2014  . Abnormal EKG 10/30/2014  . 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 End stage renal disease ([place]) 07/18/2012  . Other complications due to renal dialysis device, implant, and graft 07/18/2012    Past Surgical History:  Procedure Laterality Date  . APPENDECTOMY    . AV FISTULA PLACEMENT Left 12/03/2006   "forearm"  . AV FISTULA REPAIR Left 11/25/2009   "forearm"  . CHOLECYSTECTOMY OPEN  1970's  . COLONOSCOPY W/ POLYPECTOMY    . CORONARY ANGIOPLASTY    . CORONARY ANGIOPLASTY WITH STENT PLACEMENT    . HERNIA REPAIR    . INSERTION OF DIALYSIS CATHETER Right 2010   "chest"  . PARATHYROIDECTOMY  2010?   Parathyroid autotransplantation   . REVISON OF ARTERIOVENOUS FISTULA Left 12/04/2013   Procedure: REVISON OF ARTERIOVENOUS FISTULA;  Surgeon: Rosetta Posner, MD;  Location: [facility];  Service: Vascular;  Laterality: Left;  . REVISON OF ARTERIOVENOUS FISTULA Left 08/11/1441   Procedure: PLICATION OF LEFT BRACHIOCEPHALIC  ARTERIOVENOUS FISTULA;  Surgeon: Conrad Bensley, MD;  Location: [facility];  Service: Vascular;  Laterality: Left;  . TUBAL LIGATION  1989  . UMBILICAL HERNIA REPAIR  1990's X 2    Current Outpatient Rx  . Order #: [id number] Class: Historical Med  . Order #: [id number] Class: Historical Med  . Order #: [id number] Class: Historical Med  . Order #: [id number] Class: Historical Med  . Order #: [id number] Class: Historical Med  . Order #: [id number] Class: Historical Med  . Order #: [id number] Class: No Print  . Order #: [id number] Class: Historical Med  . Order #: [id number] Class: Historical Med  . Order #: [id number] Class: Historical Med  . Order #: [id number] Class: Historical Med  . Order #: [id number] Class: Historical Med  . Order #: [id number] Class: Historical Med  . Order #: [id number] Class: Print  . Order #: [id number] Class: Historical  Med  . Order #: [id number] Class: Historical Med  . Order #: [id number] Class: No Print  . Order #: [id number] Class: Normal  . Order #: [id number] Class: Historical Med  . Order #: [id number] Class: Historical Med    Allergies Crestor [rosuvastatin calcium]; Sulfa antibiotics; Amoxicillin; Ibuprofen; Pravastatin; Chlorphen-phenyleph-asa; [place] senior [guaifenesin]; and Rosuvastatin  Family History  Problem Relation Age of Onset  . Epilepsy Mother   . Diabetes Mother   . Cancer Mother   . Alcohol abuse Father     Social History Social History  Substance Use Topics  . Smoking status: Former Smoker    Years: 3.00    Types: Cigarettes  . Smokeless tobacco: Never Used     Comment: "stopped smoking in the 1970's"  . Alcohol use No    Review of Systems  Constitutional: No fever/chills Eyes: No visual changes. ENT: No sore throat. Cardiovascular: Denies chest pain. Respiratory: Denies shortness of breath. Gastrointestinal: No abdominal pain.  No nausea, no vomiting.  No diarrhea.  No constipation. Genitourinary: Negative for dysuria. Musculoskeletal: Negative for back pain. Positive neck pain.  Skin: Negative for rash. Neurological: Negative for focal weakness or numbness. Positive HA.   10-point ROS otherwise negative.  ____________________________________________   PHYSICAL EXAM:  VITAL SIGNS: ED Triage Vitals [11/24/16 1227]  Enc Vitals Group     BP 139/86     Pulse Rate 74     Resp 18     Temp 98.1 F (36.7 C)     Temp Source Oral     SpO2 97 %     Pain Score 7   Constitutional: Alert and oriented. Well appearing and in no acute distress. Eyes: Conjunctivae are normal. PERRL.  Head: Small 1x1cm scalp hematoma over the right parietal scalp with mild bruising. No laceration.  Nose: No congestion/rhinnorhea. Mouth/Throat: Mucous membranes are moist.  Oropharynx non-erythematous. Neck: No stridor. No cervical spine tenderness to palpation. Cardiovascular: Normal  rate, regular rhythm. Good peripheral circulation. Grossly normal heart sounds.   Respiratory: Normal respiratory effort.  No retractions. Lungs CTAB. Gastrointestinal: Soft and nontender. No distention.  Musculoskeletal: No lower extremity tenderness nor edema. No gross deformities of extremities. Full ROM of bilateral hips.  Neurologic:  Normal speech and language. 3/5 strength in the RUE and RLE. No CN deficits.  Skin:  Skin is warm, dry and intact. No rash noted. Bruising to the right wrist and forearm with no tenderness and full ROM of both joints.   ____________________________________________  RADIOLOGY  Ct Head Wo Contrast  Result Date: 11/24/2016 CLINICAL DATA:  Pain following fall.  Dizziness. EXAM: CT HEAD WITHOUT CONTRAST CT CERVICAL SPINE WITHOUT CONTRAST TECHNIQUE: Multidetector CT imaging of the head and cervical spine was performed following the standard protocol without intravenous contrast. Multiplanar CT image reconstructions of the cervical spine were also generated. COMPARISON:  Head CT Oct 08, 2016; cervical spine CT June 13, 2016 FINDINGS: CT HEAD FINDINGS Brain: Age related volume loss is stable. Prominence of the cisterna magna is an anatomic variant. There is no intracranial mass, hemorrhage, extra-axial fluid collection, or midline shift. There is a prior infarct at the left parieto-occipital junction. There is small vessel disease throughout the centra semiovale bilaterally. There is evidence of a prior small infarct in the inferior left centrum semiovale anteriorly involving the anterior most aspects of the internal and external capsules on the left, stable. There is no new gray-white compartment lesion. No evident acute infarct. Vascular: There is no hyperdense vessel. There is calcification in each carotid siphon region. Skull: The bony calvarium appears intact. Sinuses/Orbits: There is opacification in several ethmoid air cells bilaterally. There is mucosal thickening in  the posterior right sphenoid sinus as well as in the the inferior left maxillary antrum. There is a 6 x 4 mm osteoma in the superior right ethmoid air cell region. Orbits appear symmetric bilaterally. Other: Mastoids on the left are clear. There is evidence of previous mastoid surgery on the right. No opacification is noted in the right mastoid region. CT CERVICAL SPINE FINDINGS Alignment: There is no spondylolisthesis. Skull base and vertebrae: The skull base and craniocervical junction regions appear normal. There is no evident fracture. There are no blastic or lytic bone lesions. Cystic areas in the odontoid are stable. Soft tissues and spinal canal: Prevertebral soft tissues and predental space regions are within normal limits. No paraspinous lesions are appreciable. No cord or canal hematoma is evident. Disc levels: There is moderately severe disc space narrowing at C6-7. There is moderate disc space narrowing at C4-5. There is cystic change in the superior endplate at C5, a stable finding. There is multilevel facet hypertrophy. Exit foraminal narrowing is noted at multiple levels, more severe on the right than on the left overall. There is no frank disc extrusion or high-grade stenosis. Upper chest: Visualized upper lung zones are clear. There is aortic atherosclerosis. Other: There is calcification in each carotid artery. IMPRESSION: CT head: Widespread supratentorial small vessel disease. 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 Prior small infarct involving the anterior most aspects of the internal and external capsules on the left. No new gray-white compartment lesion. No acute infarct evident. No mass, hemorrhage, or extra-axial fluid collection. Foci of arterial vascular calcification noted. There is paranasal sinus disease at multiple sites. Patient has had previous partial mastoidectomy on the right. CT cervical spine: No fracture or spondylolisthesis. Multilevel osteoarthritic change.  Aortic atherosclerosis noted as well as calcification in each carotid artery. Electronically Signed   By: Lowella Grip III M.D.   On: 11/24/2016 13:46   Ct Cervical Spine Wo Contrast  Result Date: 11/24/2016 CLINICAL DATA:  Pain following fall.  Dizziness. EXAM: CT HEAD WITHOUT CONTRAST CT CERVICAL SPINE WITHOUT CONTRAST TECHNIQUE: Multidetector CT imaging of the head and cervical spine was performed following the standard protocol without intravenous contrast. Multiplanar CT image reconstructions of the cervical spine were also generated. COMPARISON:  Head CT Oct 08, 2016; cervical spine CT June 13, 2016 FINDINGS: CT HEAD FINDINGS Brain: Age related volume loss is stable. Prominence of the cisterna magna is an anatomic variant. There is no intracranial mass, hemorrhage, extra-axial fluid collection, or midline shift. There is a prior infarct at the left parieto-occipital junction. There is small vessel disease throughout the centra semiovale bilaterally. There is evidence of a prior small infarct in the inferior left centrum semiovale anteriorly involving  the anterior most aspects of the internal and external capsules on the left, stable. There is no new gray-white compartment lesion. No evident acute infarct. Vascular: There is no hyperdense vessel. There is calcification in each carotid siphon region. Skull: The bony calvarium appears intact. Sinuses/Orbits: There is opacification in several ethmoid air cells bilaterally. There is mucosal thickening in the posterior right sphenoid sinus as well as in the the inferior left maxillary antrum. There is a 6 x 4 mm osteoma in the superior right ethmoid air cell region. Orbits appear symmetric bilaterally. Other: Mastoids on the left are clear. There is evidence of previous mastoid surgery on the right. No opacification is noted in the right mastoid region. CT CERVICAL SPINE FINDINGS Alignment: There is no spondylolisthesis. Skull base and vertebrae: The skull  base and craniocervical junction regions appear normal. There is no evident fracture. There are no blastic or lytic bone lesions. Cystic areas in the odontoid are stable. Soft tissues and spinal canal: Prevertebral soft tissues and predental space regions are within normal limits. No paraspinous lesions are appreciable. No cord or canal hematoma is evident. Disc levels: There is moderately severe disc space narrowing at C6-7. There is moderate disc space narrowing at C4-5. There is cystic change in the superior endplate at C5, a stable finding. There is multilevel facet hypertrophy. Exit foraminal narrowing is noted at multiple levels, more severe on the right than on the left overall. There is no frank disc extrusion or high-grade stenosis. Upper chest: Visualized upper lung zones are clear. There is aortic atherosclerosis. Other: There is calcification in each carotid artery. IMPRESSION: CT head: Widespread supratentorial small vessel disease. 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 Prior small infarct involving the anterior most aspects of the internal and external capsules on the left. No new gray-white compartment lesion. No acute infarct evident. No mass, hemorrhage, or extra-axial fluid collection. Foci of arterial vascular calcification noted. There is paranasal sinus disease at multiple sites. Patient has had previous partial mastoidectomy on the right. CT cervical spine: No fracture or spondylolisthesis. Multilevel osteoarthritic change. Aortic atherosclerosis noted as well as calcification in each carotid artery. Electronically Signed   By: Lowella Grip III M.D.   On: 11/24/2016 13:46    ____________________________________________   PROCEDURES  Procedure(s) performed:   Procedures  None ____________________________________________   INITIAL IMPRESSION / ASSESSMENT AND PLAN / ED COURSE  Pertinent labs & imaging results that were available during my care of the patient  were reviewed by me and considered in my medical decision making (see chart for details).  Patient presents to the emergency department for evaluation of fall with residual headache. Fall seems to be mechanical with no prodromal or presyncope symptoms. Patient had only mild headache and was encouraged by someone to present to the emergency department today because of her antiplatelet agent. She has a very small bruise/hematoma to the right parietal scalp. No bogginess. No clinical concern for underlying fracture. The patient has baseline right-sided neurological deficits with no worsening symptoms. She had hemodialysis today without issue. Blood work was drawn at dialysis today. No indication for blood work at this time. CT scan of the head and cervical spine was ordered from triage and came back with only chronic findings. No bleeding. No fractures. Plan for her primary care physician follow-up. Gave home Oxycodone prior to d/c with HA.   At this time, I do not feel there is any life-threatening condition present. I have reviewed and discussed all results (EKG,  imaging, lab, urine as appropriate), exam findings with patient. I have reviewed nursing notes and appropriate previous records.  I feel the patient is safe to be discharged home without further emergent workup. Discussed usual and customary return precautions. Patient and family (if present) verbalize understanding and are comfortable with this plan.  Patient will follow-up with their primary care provider. If they do not have a primary care provider, information for follow-up has been provided to them. All questions have been answered.  ____________________________________________  FINAL CLINICAL IMPRESSION(S) / ED DIAGNOSES  Final diagnoses:  Fall, initial encounter  Injury of head, initial encounter  Strain of neck muscle, initial encounter  Acute nonintractable headache, unspecified headache type     MEDICATIONS GIVEN DURING THIS  VISIT:  Medications  oxyCODONE-acetaminophen (PERCOCET/ROXICET) 5-325 MG per tablet 1 tablet (1 tablet Oral Given 11/24/16 1643)     NEW OUTPATIENT MEDICATIONS STARTED DURING THIS VISIT:  None   Note:  This document was prepared using Dragon voice recognition software and may include unintentional dictation errors.  Nanda Quinton, MD Emergency Medicine   Providencia Hottenstein, [facility], MD 11/24/16 [phone]

## 2016-11-24 NOTE — Discharge Instructions (Signed)
You were seen in the Emergency Department (ED) today for a head injury.  Based on your evaluation, you may have sustained a concussion (or bruise) to your brain.  If you had a CT scan done, it did not show any evidence of serious injury or bleeding.    Symptoms to expect from a concussion include nausea, mild to moderate headache, difficulty concentrating or sleeping, and mild lightheadedness.  These symptoms should improve over the next few days to weeks, but it may take many weeks before you feel back to normal.  Return to the emergency department or follow-up with your primary care doctor if your symptoms are not improving over this time.  Signs of a more serious head injury include vomiting, severe headache, excessive sleepiness or confusion, and weakness or numbness in your face, arms or legs.  Return immediately to the Emergency Department if you experience any of these more concerning symptoms.    Rest, avoid strenuous physical or mental activity, and avoid activities that could potentially result in another head injury until all your symptoms from this head injury are completely resolved for at least 2-3 weeks.  You may take acetaminophen over the counter according to label instructions for mild headache or scalp soreness.

## 2016-11-25 ENCOUNTER — Encounter: Payer: Self-pay | Admitting: Family

## 2016-11-25 ENCOUNTER — Ambulatory Visit (INDEPENDENT_AMBULATORY_CARE_PROVIDER_SITE_OTHER): Payer: Self-pay | Admitting: Family

## 2016-11-25 VITALS — BP 128/68 | HR 61 | Temp 97.5°F | Resp 20 | Ht 59.0 in | Wt 198.9 lb

## 2016-11-25 DIAGNOSIS — Z992 Dependence on renal dialysis: Secondary | ICD-10-CM

## 2016-11-25 DIAGNOSIS — N186 End stage renal disease: Secondary | ICD-10-CM

## 2016-11-25 DIAGNOSIS — T82898D Other specified complication of vascular prosthetic devices, implants and grafts, subsequent encounter: Secondary | ICD-10-CM

## 2016-11-25 NOTE — Progress Notes (Signed)
    Postoperative Access Visit   History of Present Illness  Roberta Bryant is a 66 y.o. year old female who is s/p plication of left brachiocephalic arteriovenous fistula on 11-11-16 by Dr. Bridgett Larsson for pseudoaneurysm degeneration of arteriovenous fistula.  FINDING(S): 1. 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 Palpable thrill at end of the case  Pt reports baseline tolerable tingling in left hand. She states she started HD in 2010, had the same left arm AVF since 2007 with several revisions.  She reports that her AV fistula is accessed proximal and distal to the plication site.   She fell on 11-20-16, seen in ED, hit her head. She takes Brilinta until the end of August 2018, for cardiac stent placed.    The patient's left arm incision is healing well.  The patient is able to complete their activities of daily living.    For VQI Use Only  PRE-ADM LIVING: Home  AMB STATUS: Wheelchair, but she is ambulatry  Physical Examination Vitals:   11/25/16 1116  BP: 128/68  Pulse: 61  Resp: 20  Temp: 97.5 F (36.4 C)  TempSrc: Oral  SpO2: 95%  Weight: 198 lb 13.7 oz (90.2 kg)  Height: 4\' 11"  (1.499 m)   Body mass index is 40.16 kg/m.   Palpable left radial pulse. Left upper arm with staples in place, incision healing well with no signs of infection.  Left hand grip is 4/5, sensation in digits is intact, palpable thrill, bruit can be auscultated.   Medical Decision Making  Roberta Bryant is a 66 y.o. year old female who presents s/p plication of left brachiocephalic arteriovenous fistula on 11-11-16 by Dr. Bridgett Larsson for pseudoaneurysm degeneration of arteriovenous fistula.  All staples were removed today and steri strips applied.  Follow up with Dr. Bridgett Larsson as scheduled on 12-16-16. I advised pt to notify us if she develops concerns re her AV fistula or left arm.   Thank you for allowing [place] to participate in this patient's care.  Caryl Manas, Sharmon Leyden, RN, MSN,  FNP-C [facility] Office: [phone]  11/25/2016, 11:48 AM  Clinic MD: Donzetta Matters

## 2016-11-27 ENCOUNTER — Ambulatory Visit (INDEPENDENT_AMBULATORY_CARE_PROVIDER_SITE_OTHER): Payer: Medicare Other | Admitting: Physical Medicine and Rehabilitation

## 2016-11-27 ENCOUNTER — Encounter (INDEPENDENT_AMBULATORY_CARE_PROVIDER_SITE_OTHER): Payer: Self-pay | Admitting: Physical Medicine and Rehabilitation

## 2016-11-27 DIAGNOSIS — R202 Paresthesia of skin: Secondary | ICD-10-CM | POA: Diagnosis not present

## 2016-11-27 NOTE — Progress Notes (Deleted)
Right hand dominant. Numbness and pain in right arm. Feels like arm is "freezing" from shoulder to fingers. Feels like fingers are going to fall off. History of stroke. Dialysis access on left. Left arm is starting to have similar symptoms. Numbness in left fingertips, burning sensation in left arm.

## 2016-11-30 NOTE — Procedures (Signed)
EMG & NCV Findings: Evaluation of the left median motor and the right median motor nerves showed prolonged distal onset latency (L5.0, R4.6 ms), reduced amplitude (L4.3, R3.9 mV), and decreased conduction velocity (Elbow-Wrist, L48, R49 m/s).  The left median (across palm) sensory and the right median (across palm) sensory nerves showed prolonged distal peak latency (Wrist, L4.8, R4.1 ms).  The right ulnar sensory nerve showed reduced amplitude (6.1 V).  All remaining nerves (as indicated in the following tables) were within normal limits.  All left vs. right side differences were within normal limits.    Needle evaluation of the right abductor pollicis brevis muscle showed slightly increased spontaneous activity.  The right pronator teres muscle showed increased insertional activity, slightly increased spontaneous activity, and diminished recruitment.  The right biceps muscle showed increased insertional activity, moderately increased spontaneous activity, increased motor unit amplitude, and diminished recruitment.  All remaining muscles (as indicated in the following table) showed no evidence of electrical instability.    Impression: The above electrodiagnostic study is ABNORMAL  and somewhat difficult to interpret but reveals evidence of:  1. Moderate chronic C6 radiculopathy on the right.  *Essentially double crush phenomenon.  2. A moderate to severe right median nerve entrapment at the wrist (carpal tunnel syndrome) affecting sensory and motor components.   3. A moderate left median nerve entrapment at the wrist (carpal tunnel syndrome) affecting sensory and motor components.  4. Very likely concomitant underlying peripheral polyneuropathy.   Recommendations: 1.  Follow-up with referring physician. 2.  Continue current management of symptoms. Careful correlation of symptoms is paramount. Cervical MRI/xray may help diagnostically.      Nerve Conduction Studies Anti Sensory Summary  Table   Stim Site NR Peak (ms) Norm Peak (ms) P-T Amp (V) Norm P-T Amp Site1 Site2 Delta-P (ms) Dist (cm) Vel (m/s) Norm Vel (m/s)  Left Median Acr Palm Anti Sensory (2nd Digit)  33.1C  Wrist    *4.8 <3.6 16.9 >10 Wrist Palm 2.8 0.0    Palm    2.0 <2.0 17.8         Right Median Acr Palm Anti Sensory (2nd Digit)  32.8C  Wrist    *4.1 <3.6 21.2 >10 Wrist Palm 2.3 0.0    Palm    1.8 <2.0 29.5         Right Radial Anti Sensory (Base 1st Digit)  31.6C  Wrist    1.8 <3.1 13.4  Wrist Base 1st Digit 1.8 0.0    Right Ulnar Anti Sensory (5th Digit)  32.1C  Wrist    3.2 <3.7 *6.1 >15.0 Wrist 5th Digit 3.2 14.0 44 >38   Motor Summary Table   Stim Site NR Onset (ms) Norm Onset (ms) O-P Amp (mV) Norm O-P Amp Site1 Site2 Delta-0 (ms) Dist (cm) Vel (m/s) Norm Vel (m/s)  Left Median Motor (Abd Poll Brev)  33.8C  Wrist    *5.0 <4.2 *4.3 >5 Elbow Wrist 4.4 21.3 *48 >50  Elbow    9.4  4.2         Right Median Motor (Abd Poll Brev)  31.5C  Wrist    *4.6 <4.2 *3.9 >5 Elbow Wrist 4.9 24.0 *49 >50  Elbow    9.5  2.4         Right Ulnar Motor (Abd Dig Min)  31.6C  Wrist    3.0 <4.2 8.2 >3 B Elbow Wrist 4.0 22.0 55 >53  B Elbow    7.0  7.6  A Elbow B Elbow  1.3 9.0 69 >53  A Elbow    8.3  3.4          EMG   Side Muscle Nerve Root Ins Act Fibs Psw Amp Dur Poly Recrt Int Fraser Din Comment  Right Abd Poll Brev Median C8-T1 Nml *1+ *1+ Nml Nml 0 Nml Nml   Right 1stDorInt Ulnar C8-T1 Nml Nml Nml Nml Nml 0 Nml Nml   Right PronatorTeres Median C6-7 *Incr *1+ *1+ Nml Nml 0 *Reduced Nml   Right Biceps Musculocut C5-6 *CRD *2+ *2+ *Incr Nml 0 *Reduced Nml   Right Deltoid Axillary C5-6 Nml Nml Nml Nml Nml 0 Nml Nml     Nerve Conduction Studies Anti Sensory Left/Right Comparison   Stim Site L Lat (ms) R Lat (ms) L-R Lat (ms) L Amp (V) R Amp (V) L-R Amp (%) Site1 Site2 L Vel (m/s) R Vel (m/s) L-R Vel (m/s)  Median Acr Palm Anti Sensory (2nd Digit)  33.1C  Wrist *4.8 *4.1 0.7 16.9 21.2 20.3 Wrist Palm      Palm 2.0 1.8 0.2 17.8 29.5 39.7       Radial Anti Sensory (Base 1st Digit)  31.6C  Wrist  1.8   13.4  Wrist Base 1st Digit     Ulnar Anti Sensory (5th Digit)  32.1C  Wrist  3.2   *6.1  Wrist 5th Digit  44    Motor Left/Right Comparison   Stim Site L Lat (ms) R Lat (ms) L-R Lat (ms) L Amp (mV) R Amp (mV) L-R Amp (%) Site1 Site2 L Vel (m/s) R Vel (m/s) L-R Vel (m/s)  Median Motor (Abd Poll Brev)  33.8C  Wrist *5.0 *4.6 0.4 *4.3 *3.9 9.3 Elbow Wrist *48 *49 1  Elbow 9.4 9.5 0.1 4.2 2.4 42.9       Ulnar Motor (Abd Dig Min)  31.6C  Wrist  3.0   8.2  B Elbow Wrist  55   B Elbow  7.0   7.6  A Elbow B Elbow  69   A Elbow  8.3   3.4

## 2016-11-30 NOTE — Progress Notes (Signed)
Roberta Bryant - 66 y.o. female MRN [id number]  Date of birth: November 13, 1950  Office Visit Note: Visit Date: 11/27/2016 PCP: Valaria Good, PA-C Referred by: Valaria Good, PA-C  Subjective: Chief Complaint  Patient presents with  . Right Arm - Numbness, Pain  . Left Arm - Numbness, Pain   HPI: Mrs. Vanhorne is a 66 year old right-hand-dominant female who represents a very complicated case especially for electrodiagnostic study. She is having numbness and tingling and pain in the right arm. She reports that the arm feels like it's "freezing "from the shoulder to the fingers. She is very descriptive documentation of feeling like her fingers are going to fall off. This is all on the right side. She reports starting to get some symptoms on the left fingertips and all the fingertips. 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 She does have diabetes. She does have a history of right rotator cuff tear. She has pain from the neck and shoulder down the arm. She also has tingling in the right hand. Actually saw her on a different occasion for potential lumbar injection but she was having stroke symptoms at the time. She is followed by Dr. Jannifer Franklin at [facility] neurology. She has not had electrodiagnostic study. She has not had cervical MRI. She did have recent CT scan of the head and cervical spine on 11/24/2016. This is described below. There was osteoarthritic change of the cervical spine but without bony stenosis. She is followed in our office by Dr. Ninfa Linden who request that the electrodiagnostic study of her hand.    ROS Otherwise per HPI.  Assessment & Plan: Visit Diagnoses:  1. Paresthesia of skin     Plan: No additional findings.  Impression: The above electrodiagnostic study is ABNORMAL  and somewhat difficult to interpret but reveals evidence of:  1. Moderate chronic C6 radiculopathy on the right.  *Essentially double crush  phenomenon.  2. A moderate to severe right median nerve entrapment at the wrist (carpal tunnel syndrome) affecting sensory and motor components.   3. A moderate left median nerve entrapment at the wrist (carpal tunnel syndrome) affecting sensory and motor components.  4. Very likely concomitant underlying peripheral polyneuropathy.   Recommendations: 1.  Follow-up with referring physician. 2.  Continue current management of symptoms. Careful correlation of symptoms is paramount. Cervical MRI/xray may help diagnostically.     Meds & Orders: No orders of the defined types were placed in this encounter.   Orders Placed This Encounter  Procedures  . NCV with EMG (electromyography)    Follow-up: Return in about 2 weeks (around 12/11/2016) for Dr. Ninfa Linden.   Procedures: No procedures performed  EMG & NCV Findings: Evaluation of the left median motor and the right median motor nerves showed prolonged distal onset latency (L5.0, R4.6 ms), reduced amplitude (L4.3, R3.9 mV), and decreased conduction velocity (Elbow-Wrist, L48, R49 m/s).  The left median (across palm) sensory and the right median (across palm) sensory nerves showed prolonged distal peak latency (Wrist, L4.8, R4.1 ms).  The right ulnar sensory nerve showed reduced amplitude (6.1 V).  All remaining nerves (as indicated in the following tables) were within normal limits.  All left vs. right side differences were within normal limits.    Needle evaluation of the right abductor pollicis brevis muscle showed slightly increased spontaneous activity.  The right pronator teres muscle showed increased insertional activity, slightly increased spontaneous activity, and diminished recruitment.  The right biceps muscle showed  increased insertional activity, moderately increased spontaneous activity, increased motor unit amplitude, and diminished recruitment.  All remaining muscles (as indicated in the following table) showed no evidence of  electrical instability.    Impression: The above electrodiagnostic study is ABNORMAL  and somewhat difficult to interpret but reveals evidence of:  5. Moderate chronic C6 radiculopathy on the right.  *Essentially double crush phenomenon.  6. A moderate to severe right median nerve entrapment at the wrist (carpal tunnel syndrome) affecting sensory and motor components.   7. A moderate left median nerve entrapment at the wrist (carpal tunnel syndrome) affecting sensory and motor components.  8. Very likely concomitant underlying peripheral polyneuropathy.   Recommendations: 1.  Follow-up with referring physician. 2.  Continue current management of symptoms. Careful correlation of symptoms is paramount. Cervical MRI/xray may help diagnostically.      Nerve Conduction Studies Anti Sensory Summary Table   Stim Site NR Peak (ms) Norm Peak (ms) P-T Amp (V) Norm P-T Amp Site1 Site2 Delta-P (ms) Dist (cm) Vel (m/s) Norm Vel (m/s)  Left Median Acr Palm Anti Sensory (2nd Digit)  33.1C  Wrist    *4.8 <3.6 16.9 >10 Wrist Palm 2.8 0.0    Palm    2.0 <2.0 17.8         Right Median Acr Palm Anti Sensory (2nd Digit)  32.8C  Wrist    *4.1 <3.6 21.2 >10 Wrist Palm 2.3 0.0    Palm    1.8 <2.0 29.5         Right Radial Anti Sensory (Base 1st Digit)  31.6C  Wrist    1.8 <3.1 13.4  Wrist Base 1st Digit 1.8 0.0    Right Ulnar Anti Sensory (5th Digit)  32.1C  Wrist    3.2 <3.7 *6.1 >15.0 Wrist 5th Digit 3.2 14.0 44 >38   Motor Summary Table   Stim Site NR Onset (ms) Norm Onset (ms) O-P Amp (mV) Norm O-P Amp Site1 Site2 Delta-0 (ms) Dist (cm) Vel (m/s) Norm Vel (m/s)  Left Median Motor (Abd Poll Brev)  33.8C  Wrist    *5.0 <4.2 *4.3 >5 Elbow Wrist 4.4 21.3 *48 >50  Elbow    9.4  4.2         Right Median Motor (Abd Poll Brev)  31.5C  Wrist    *4.6 <4.2 *3.9 >5 Elbow Wrist 4.9 24.0 *49 >50  Elbow    9.5  2.4         Right Ulnar Motor (Abd Dig Min)  31.6C  Wrist    3.0 <4.2 8.2 >3 B Elbow  Wrist 4.0 22.0 55 >53  B Elbow    7.0  7.6  A Elbow B Elbow 1.3 9.0 69 >53  A Elbow    8.3  3.4          EMG   Side Muscle Nerve Root Ins Act Fibs Psw Amp Dur Poly Recrt Int Fraser Din Comment  Right Abd Poll Brev Median C8-T1 Nml *1+ *1+ Nml Nml 0 Nml Nml   Right 1stDorInt Ulnar C8-T1 Nml Nml Nml Nml Nml 0 Nml Nml   Right PronatorTeres Median C6-7 *Incr *1+ *1+ Nml Nml 0 *Reduced Nml   Right Biceps Musculocut C5-6 *CRD *2+ *2+ *Incr Nml 0 *Reduced Nml   Right Deltoid Axillary C5-6 Nml Nml Nml Nml Nml 0 Nml Nml     Nerve Conduction Studies Anti Sensory Left/Right Comparison   Stim Site L Lat (ms) R Lat (ms) L-R Lat (ms) L  Amp (V) R Amp (V) L-R Amp (%) Site1 Site2 L Vel (m/s) R Vel (m/s) L-R Vel (m/s)  Median Acr Palm Anti Sensory (2nd Digit)  33.1C  Wrist *4.8 *4.1 0.7 16.9 21.2 20.3 Wrist Palm     Palm 2.0 1.8 0.2 17.8 29.5 39.7       Radial Anti Sensory (Base 1st Digit)  31.6C  Wrist  1.8   13.4  Wrist Base 1st Digit     Ulnar Anti Sensory (5th Digit)  32.1C  Wrist  3.2   *6.1  Wrist 5th Digit  44    Motor Left/Right Comparison   Stim Site L Lat (ms) R Lat (ms) L-R Lat (ms) L Amp (mV) R Amp (mV) L-R Amp (%) Site1 Site2 L Vel (m/s) R Vel (m/s) L-R Vel (m/s)  Median Motor (Abd Poll Brev)  33.8C  Wrist *5.0 *4.6 0.4 *4.3 *3.9 9.3 Elbow Wrist *48 *49 1  Elbow 9.4 9.5 0.1 4.2 2.4 42.9       Ulnar Motor (Abd Dig Min)  31.6C  Wrist  3.0   8.2  B Elbow Wrist  55   B Elbow  7.0   7.6  A Elbow B Elbow  69   A Elbow  8.3   3.4              Clinical History: CT Head and Cervical Spine 11/24/2016   IMPRESSION: CT head: Widespread supratentorial small vessel disease. 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 Prior small infarct involving the anterior most aspects of the internal and external capsules on the left. No new gray-white compartment lesion. No acute infarct evident. No mass, hemorrhage, or extra-axial fluid collection. Foci of arterial vascular  calcification noted. There is paranasal sinus disease at multiple sites. Patient has had previous partial mastoidectomy on the right.  CT cervical spine: No fracture or spondylolisthesis. Multilevel osteoarthritic change. Aortic atherosclerosis noted as well as calcification in each carotid artery.  She reports that she has quit smoking. Her smoking use included Cigarettes. She quit after 3.00 years of use. She has never used smokeless tobacco. No results for input(s): HGBA1C, LABURIC in the last 8760 hours.  Objective:  VS:  HT:    WT:   BMI:     BP:   HR: bpm  TEMP: ( )  RESP:  Physical Exam  Musculoskeletal:  Patient is in a wheelchair today. Examination of both upper extremities shows painful range of motion of the right shoulder with some weakness. She has good distal strength of the right hand although it's hard for her to give effort. She has good wrist extension and abduction bilaterally. Left upper extremity reveals fistula in the brachiocephalic area on the left. She has extensive bruising from recent procedure on the fistula for aneurysm. On the right hand she has some flattening of the right APB compared to left. She has no intrinsic hand atrophy or atrophy of the FDI. She has decreased sensation in a median nerve or C6 distribution on the right. She has no allodynia or color change.    Ortho Exam Imaging: No results found.  Past Medical/Family/Surgical/Social History: Medications & Allergies reviewed per EMR Patient Active Problem List   Diagnosis Date Noted  . Complete tear of right rotator cuff 10/21/2016  . Gait abnormality 08/28/2016  . Chest pain 11/23/2015  . ESRD on dialysis ([place])   . Type 2 diabetes mellitus with complication ([place])   . Essential hypertension   . Abnormal ECG   .  Pain in the chest   . Hypotension 10/30/2014  . Abnormal EKG 10/30/2014  . 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 End stage renal disease ([place]) 07/18/2012   . Other complications due to renal dialysis device, implant, and graft 07/18/2012   Past Medical History:  Diagnosis Date  . Anemia   . CHF (congestive heart failure) ([place])   . Childhood asthma   . Chronic upper back pain   . ESRD (end stage renal disease) on dialysis ([place])    "TTS; [facility] in [place]" (10/30/2014)  . Gait abnormality 08/28/2016  . GERD (gastroesophageal reflux disease)   . Headache    "a few times/week" (10/30/2014)  . History of blood transfusion 1996   "related to menses"  . Hyperlipidemia   . Hypertension   . Hypothyroidism   . IBS (irritable bowel syndrome)   . Myocardial infarction ([place]) 2004  . Osteoarthritis   . Pneumonia    "years ago"  . Stroke ([place]) 07/2016   mini stroke , right side of body  . Type II diabetes mellitus ([facility])    Family History  Problem Relation Age of Onset  . Epilepsy Mother   . Diabetes Mother   . Cancer Mother   . Alcohol abuse Father    Past Surgical History:  Procedure Laterality Date  . APPENDECTOMY    . AV FISTULA PLACEMENT Left 12/03/2006   "forearm"  . AV FISTULA REPAIR Left 11/25/2009   "forearm"  . CHOLECYSTECTOMY OPEN  1970's  . COLONOSCOPY W/ POLYPECTOMY    . CORONARY ANGIOPLASTY    . CORONARY ANGIOPLASTY WITH STENT PLACEMENT    . HERNIA REPAIR    . INSERTION OF DIALYSIS CATHETER Right 2010   "chest"  . PARATHYROIDECTOMY  2010?   Parathyroid autotransplantation   . REVISON OF ARTERIOVENOUS FISTULA Left 12/04/2013   Procedure: REVISON OF ARTERIOVENOUS FISTULA;  Surgeon: Rosetta Posner, MD;  Location: [facility];  Service: Vascular;  Laterality: Left;  . REVISON OF ARTERIOVENOUS FISTULA Left 12/15/8919   Procedure: PLICATION OF LEFT BRACHIOCEPHALIC  ARTERIOVENOUS FISTULA;  Surgeon: Conrad Wilbur, MD;  Location: [facility];  Service: Vascular;  Laterality: Left;  . TUBAL LIGATION  1989  . UMBILICAL HERNIA REPAIR  1990's X 2   Social History   Occupational History  . Disabled    Social History Main Topics  .  Smoking status: Former Smoker    Years: 3.00    Types: Cigarettes  . Smokeless tobacco: Never Used     Comment: "stopped smoking in the 1970's"  . Alcohol use No  . Drug use: Yes    Types: Marijuana     Comment: as a teenager  . Sexual activity: Not Currently

## 2016-12-06 NOTE — Progress Notes (Deleted)
   Patient ID: Roberta Bryant, female    DOB: 09-28-1950, 66 y.o.   MRN: [id number]  HPI    Review of Systems    Physical Exam

## 2016-12-07 ENCOUNTER — Encounter: Payer: Self-pay | Admitting: Vascular Surgery

## 2016-12-07 ENCOUNTER — Encounter: Payer: Self-pay | Admitting: Cardiology

## 2016-12-07 ENCOUNTER — Ambulatory Visit: Payer: Medicare Other | Admitting: Cardiology

## 2016-12-07 ENCOUNTER — Ambulatory Visit (INDEPENDENT_AMBULATORY_CARE_PROVIDER_SITE_OTHER): Payer: Medicare Other | Admitting: Cardiology

## 2016-12-07 VITALS — BP 112/62 | HR 64 | Resp 10 | Ht 59.0 in | Wt 202.0 lb

## 2016-12-07 DIAGNOSIS — N186 End stage renal disease: Secondary | ICD-10-CM

## 2016-12-07 DIAGNOSIS — Z992 Dependence on renal dialysis: Secondary | ICD-10-CM

## 2016-12-07 DIAGNOSIS — R9431 Abnormal electrocardiogram [ECG] [EKG]: Secondary | ICD-10-CM | POA: Diagnosis not present

## 2016-12-07 DIAGNOSIS — E118 Type 2 diabetes mellitus with unspecified complications: Secondary | ICD-10-CM

## 2016-12-07 DIAGNOSIS — I10 Essential (primary) hypertension: Secondary | ICD-10-CM | POA: Diagnosis not present

## 2016-12-07 NOTE — Patient Instructions (Addendum)
Medication Instructions:  Your physician recommends that you continue on your current medications as directed. Please refer to the Current Medication list given to you today.  Labwork: Your physician recommends that you return for lab work in: For lipids fasting in the next week.   Testing/Procedures: Your physician has requested that you have an echocardiogram. Echocardiography is a painless test that uses sound waves to create images of your heart. It provides your doctor with information about the size and shape of your heart and how well your heart's chambers and valves are working. This procedure takes approximately one hour. There are no restrictions for this procedure.    Follow-Up: Your physician recommends that you schedule a follow-up appointment in: 1 month  Echocardiogram An echocardiogram, or echocardiography, uses sound waves (ultrasound) to produce an image of your heart. The echocardiogram is simple, painless, obtained within a short period of time, and offers valuable information to your health care provider. The images from an echocardiogram can provide information such as:  Evidence of coronary artery disease (CAD).  Heart size.  Heart muscle function.  Heart valve function.  Aneurysm detection.  Evidence of a past heart attack.  Fluid buildup around the heart.  Heart muscle thickening.  Assess heart valve function.  Tell a health care provider about:  Any allergies you have.  All medicines you are taking, including vitamins, herbs, eye drops, creams, and over-the-counter medicines.  Any problems you or family members have had with anesthetic medicines.  Any blood disorders you have.  Any surgeries you have had.  Any medical conditions you have.  Whether you are pregnant or may be pregnant. What happens before the procedure? No special preparation is needed. Eat and drink normally. What happens during the procedure?  In order to produce an  image of your heart, gel will be applied to your chest and a wand-like tool (transducer) will be moved over your chest. The gel will help transmit the sound waves from the transducer. The sound waves will harmlessly bounce off your heart to allow the heart images to be captured in real-time motion. These images will then be recorded.  You may need an IV to receive a medicine that improves the quality of the pictures. What happens after the procedure? You may return to your normal schedule including diet, activities, and medicines, unless your health care provider tells you otherwise. This information is not intended to replace advice given to you by your health care provider. Make sure you discuss any questions you have with your health care provider. Document Released: 05/22/2000 Document Revised: 01/11/2016 Document Reviewed: 01/30/2013 Elsevier Interactive Patient Education  2017 [employer].   Any Other Special Instructions Will Be Listed Below (If Applicable).     If you need a refill on your cardiac medications before your next appointment, please call your pharmacy.

## 2016-12-07 NOTE — Progress Notes (Signed)
Cardiology Office Note:    Date:  12/07/2016   ID:  Roberta Bryant, DOB May 22, 1951, MRN [id number]  PCP:  Valaria Good, PA-C  Cardiologist:  [name], MD    Referring MD: Valaria Good, PA-C   Chief Complaint  Patient presents with  . Follow-up  Multiple cardiac issues  History of Present Illness:    Roberta Bryant is a 66 y.o. female with a hx of Coronary artery disease status post PTCA and stenting of left main coronary artery about a year ago. Cardiac-wise appears to be doing Bryant. Denies having chest pain tightness squeezing pressure burning in the chest.  She was misdiagnosed with Parkinson's disease. Diagnoses being clarified and she was taking off medications. She is feeling better now. Tests have history of CVA. She's seen neurologist, quite extensive evaluation has been done including carotid ultrasound. I will try to retrieve those data. She is asking about Brilinta and I told her to continue this medication until the beginning of August. I would lead to see her back in my office in about a month.  Past Medical History:  Diagnosis Date  . Anemia   . CHF (congestive heart failure) ([place])   . Childhood asthma   . Chronic upper back pain   . Coronary artery disease    Stent to left main coronary artery second of August 2017  . ESRD (end stage renal disease) on dialysis ([place])    "TTS; [facility] in [place]" (10/30/2014)  . Gait abnormality 08/28/2016  . GERD (gastroesophageal reflux disease)   . Headache    "a few times/week" (10/30/2014)  . History of blood transfusion 1996   "related to menses"  . Hyperlipidemia   . Hypertension   . Hypothyroidism   . IBS (irritable bowel syndrome)   . Myocardial infarction ([place]) 2004  . Osteoarthritis   . Pneumonia    "years ago"  . Stroke ([place]) 07/2016   mini stroke , right side of body  . Type II diabetes mellitus ([place])     Past Surgical History:  Procedure Laterality Date  . APPENDECTOMY    . AV FISTULA  PLACEMENT Left 12/03/2006   "forearm"  . AV FISTULA REPAIR Left 11/25/2009   "forearm"  . CHOLECYSTECTOMY OPEN  1970's  . COLONOSCOPY W/ POLYPECTOMY    . CORONARY ANGIOPLASTY    . CORONARY ANGIOPLASTY WITH STENT PLACEMENT    . HERNIA REPAIR    . INSERTION OF DIALYSIS CATHETER Right 2010   "chest"  . PARATHYROIDECTOMY  2010?   Parathyroid autotransplantation   . REVISON OF ARTERIOVENOUS FISTULA Left 12/04/2013   Procedure: REVISON OF ARTERIOVENOUS FISTULA;  Surgeon: Rosetta Posner, MD;  Location: [facility];  Service: Vascular;  Laterality: Left;  . REVISON OF ARTERIOVENOUS FISTULA Left 08/08/9922   Procedure: PLICATION OF LEFT BRACHIOCEPHALIC  ARTERIOVENOUS FISTULA;  Surgeon: Conrad [place], MD;  Location: [facility];  Service: Vascular;  Laterality: Left;  . TUBAL LIGATION  1989  . UMBILICAL HERNIA REPAIR  1990's X 2    Current Medications: Current Meds  Medication Sig  . acetaminophen (TYLENOL) 325 MG tablet Take 650 mg by mouth every 6 (six) hours as needed for moderate pain or headache.   Marland Kitchen aspirin EC 81 MG tablet Take 81 mg by mouth every morning.  . calcium acetate (PHOSLO) 667 MG capsule Take 2-4 capsules by mouth 4 (four) times daily. 4 with meals and 2 with snacks  . carvedilol (COREG) 12.5 MG tablet Take 25 mg by  mouth at bedtime.   . docusate sodium (COLACE) 100 MG capsule Take 100 mg by mouth 2 (two) times daily.  Marland Kitchen ezetimibe (ZETIA) 10 MG tablet Take 10 mg by mouth daily.  . Fenofibrate 120 MG TABS Take 1 tablet (120 mg total) by mouth daily.  . finasteride (PROSCAR) 5 MG tablet Take 2.5 mg by mouth daily.  Marland Kitchen levothyroxine (SYNTHROID, LEVOTHROID) 50 MCG tablet Take 50 mcg by mouth daily before breakfast.   . liraglutide (VICTOZA) 18 MG/3ML SOPN Inject into the skin.  Marland Kitchen loperamide (IMODIUM A-D) 2 MG tablet Take 2 mg by mouth 4 (four) times daily as needed for diarrhea or loose stools.  . meclizine (ANTIVERT) 25 MG tablet Take 12.5-25 mg by mouth 3 (three) times daily as needed for  dizziness.   . multivitamin (RENA-VIT) TABS tablet Take 1 tablet by mouth daily.   Marland Kitchen omeprazole (PRILOSEC) 20 MG capsule Take 20 mg by mouth 2 (two) times daily before a meal.  . oxyCODONE (OXY IR/ROXICODONE) 5 MG immediate release tablet Take 1 tablet (5 mg total) by mouth daily as needed for moderate pain.  Marland Kitchen prochlorperazine (COMPAZINE) 5 MG tablet Take 5 mg by mouth every 6 (six) hours as needed for nausea or vomiting (for dialysis).  . sitaGLIPtin (JANUVIA) 25 MG tablet Take 25 mg by mouth daily.  . ticagrelor (BRILINTA) 90 MG TABS tablet Take 1 tablet (90 mg total) by mouth 2 (two) times daily.  Marland Kitchen tiZANidine (ZANAFLEX) 2 MG tablet Take 1 tablet (2 mg total) by mouth 2 (two) times daily.  . Vitamin D, Ergocalciferol, (DRISDOL) 50000 units CAPS capsule Take 50,000 Units by mouth every Sunday.  . Zinc 50 MG CAPS Take 50 mg by mouth every morning.      Allergies:   Crestor [rosuvastatin calcium]; Sulfa antibiotics; Amoxicillin; Ibuprofen; Pravastatin; Chlorphen-phenyleph-asa; Naldecon senior [guaifenesin]; and Rosuvastatin   Social History   Social History  . Marital status: Divorced    Spouse name: N/A  . Number of children: 4  . Years of education: Some college   Occupational History  . Disabled    Social History Main Topics  . Smoking status: Former Smoker    Years: 3.00    Types: Cigarettes  . Smokeless tobacco: Never Used     Comment: "stopped smoking in the 1970's"  . Alcohol use No  . Drug use: Yes    Types: Marijuana     Comment: as a teenager  . Sexual activity: Not Currently   Other Topics Concern  . None   Social History Narrative   Lives at home with sons   Caffeine use: Coffee-decaf   Right-handed     Family History: The patient's family history includes Alcohol abuse in her father; Cancer in her mother; Diabetes in her mother; Epilepsy in her mother. ROS:   Please see the history of present illness.     All other systems reviewed and are  negative.  EKGs/Labs/Other Studies Reviewed:     Recent Labs: 08/13/2016: ALT 5 10/08/2016: BUN 28; Creatinine, Ser 5.31; Platelets 272 11/11/2016: Hemoglobin 11.9; Potassium 4.3; Sodium 136  Recent Lipid Panel    Component Value Date/Time   CHOL 303 (H) 11/24/2015 0311   TRIG 758 (H) 11/24/2015 0311   HDL 36 (L) 11/24/2015 0311   CHOLHDL 8.4 11/24/2015 0311   VLDL UNABLE TO CALCULATE IF TRIGLYCERIDE OVER 400 mg/dL 11/24/2015 0311   LDLCALC UNABLE TO CALCULATE IF TRIGLYCERIDE OVER 400 mg/dL 11/24/2015 0311    Physical  Exam:    VS:  BP 112/62   Pulse 64   Resp 10   Ht 4\' 11"  (1.499 m)   Wt 202 lb (91.6 kg)   BMI 40.80 kg/m     Wt Readings from Last 3 Encounters:  12/07/16 202 lb (91.6 kg)  11/25/16 198 lb 13.7 oz (90.2 kg)  11/11/16 205 lb (93 kg)     GEN:  Bryant nourished, Bryant developed in no acute distress HEENT: Normal NECK: No JVD; No carotid bruits LYMPHATICS: No lymphadenopathy CARDIAC: RRR, there is systolic ejection murmur grade 2/6 best heard in the right upper portion of the sternal with radiation towards the neck, no, rubs, gallops RESPIRATORY:  Clear to auscultation without rales, wheezing or rhonchi  ABDOMEN: Soft, non-tender, non-distended MUSCULOSKELETAL:  No edema; No deformity  SKIN: Warm and dry NEUROLOGIC:  Alert and oriented x 3 PSYCHIATRIC:  Normal affect   ASSESSMENT:    1. Essential hypertension   2. Type 2 diabetes mellitus with complication, unspecified whether long term insulin use ([place])   3. ESRD on dialysis [facility])    PLAN:    In order of problems listed above:  1. Coronary artery disease. Stable, status post stenting to left main. We'll continue with Brilinta for about another month. 2. Peripheral vascular disease, status post CVA. Follow-up by neurologist. Receive carotid ultrasound. 3. Systolic ejection murmur: We'll ask for echocardiogram. 4. Chronic kidney failure on dialysis. She is doing Bryant dialysis 3 times a  week. 5. Dyslipidemia: We'll contact primary care physician to get fasting lipid profile.   Medication Adjustments/Labs and Tests Ordered: Current medicines are reviewed at length with the patient today.  Concerns regarding medicines are outlined above.  No orders of the defined types were placed in this encounter.  No orders of the defined types were placed in this encounter.   Signed, [name], MD  12/07/2016 11:06 AM    [place]

## 2016-12-14 ENCOUNTER — Encounter (INDEPENDENT_AMBULATORY_CARE_PROVIDER_SITE_OTHER): Payer: Self-pay | Admitting: Orthopaedic Surgery

## 2016-12-14 ENCOUNTER — Ambulatory Visit (INDEPENDENT_AMBULATORY_CARE_PROVIDER_SITE_OTHER): Payer: Medicare Other | Admitting: Orthopaedic Surgery

## 2016-12-14 DIAGNOSIS — G5601 Carpal tunnel syndrome, right upper limb: Secondary | ICD-10-CM | POA: Diagnosis not present

## 2016-12-14 DIAGNOSIS — M75121 Complete rotator cuff tear or rupture of right shoulder, not specified as traumatic: Secondary | ICD-10-CM | POA: Diagnosis not present

## 2016-12-14 MED ORDER — LIDOCAINE HCL 1 % IJ SOLN
3.0000 mL | INTRAMUSCULAR | Status: AC | PRN
  Administered 2016-12-14: 3 mL

## 2016-12-14 MED ORDER — METHYLPREDNISOLONE ACETATE 40 MG/ML IJ SUSP
40.0000 mg | INTRAMUSCULAR | Status: AC | PRN
  Administered 2016-12-14: 40 mg via INTRA_ARTICULAR

## 2016-12-14 NOTE — Progress Notes (Signed)
    Postoperative Access Visit   History of Present Illness   Roberta Bryant is a 66 y.o. year old female who presents for postoperative follow-up for: left brachiocephalic arteriovenous fistula plication (Date: 0/7/12).  The patient's wounds are healed.  The patient notes no steal symptoms.  The patient is able to complete their activities of daily living.  The patient's current symptoms are: none.   Physical Examination   Vitals:   12/16/16 1336 12/16/16 1338  BP: (!) 148/71 (!) 146/74  Pulse: 70 69  Resp: 16   Temp: 98.1 F (36.7 C)   SpO2: 99%   Weight: 198 lb (89.8 kg)   Height: 4\' 10"  (1.473 m)     LUE: Incision is healed, skin feels warm, hand grip is 5/5, sensation in digits is intact, palpable thrill, bruit can be auscultated    Medical Decision Making   Roberta Bryant is a 66 y.o. year old female who presents s/p left brachiocephalic arteriovenous fistula plication   The patient's access is already being used.  Ok to use full length of fistula at this point.  Thank you for allowing [place] to participate in this patient's care.   [name], MD, FACS [facility] Office: [phone] Pager: [phone]

## 2016-12-14 NOTE — Progress Notes (Signed)
Office Visit Note   Patient: Roberta Bryant           Date of Birth: [birth year]           MRN: [id number] Visit Date: 12/14/2016              Requested by: Valaria Good, PA-C [address] [place], [place] [address] PCP: Valaria Good, PA-C   Assessment & Plan: Visit Diagnoses:  1. Complete tear of right rotator cuff   2. Carpal tunnel syndrome, right upper limb     Plan: Due to the severity of her shoulder pain at did recommend a steroid injection right shoulder. I do feel that once she is off blood thinning medication she may be a surgical candidate no [place] the shoulder that we had addressed first. However she would need clearance from her cardiologist. This is surgery and she would have to be put under general anesthesia 4. I would need significant clearance before proceeding with surgery such as this. She understands this would not address her neck and hand issues and the numbness and tingling in her hand either. I like see right myself in about 6 weeks because she'll then seen by the cardiologist by then and be off blood thinning medication and we can get an idea whether or not we can pursue a surgical intervention. All questions were encouraged and answered.  Follow-Up Instructions: Return in about 6 weeks (around 01/25/2017).   Orders:  Orders Placed This Encounter  Procedures  . Large Joint Injection/Arthrocentesis  . Large Joint Injection/Arthrocentesis   No orders of the defined types were placed in this encounter.     Procedures: Large Joint Inj Date/Time: 12/14/2016 10:00 AM Performed by: Mcarthur Rossetti Authorized by: Mcarthur Rossetti   Location:  Shoulder Site:  R subacromial bursa Ultrasound Guidance: No   Fluoroscopic Guidance: No   Arthrogram: No   Medications:  3 mL lidocaine 1 %; 40 mg methylPREDNISolone acetate 40 MG/ML     Clinical Data: No additional findings.   Subjective: Chief Complaint  Patient presents with  .  Right Hand - Follow-up    Review EMG/NCS  The patient is returning for follow-up after nerve conduction studies involving her right upper extremity. She has a known deficit of her rotator cuff. She still on blood thinning medications which she may be a come off in a month. Her right shoulder pain is severe. She said her blood glucose is been under her to get control. She does have numbness and tingling in her right hand as well. The nerve conduction studies did show moderate carpal tunnel syndrome right side but also showed a moderate radiculopathy from the C6 distribution. She actually recently had a CT scan of her head and neck and I have this for my review today as well. Those were ordered from the vascular surgeons to assess her carotid arteries.  HPI  Review of Systems She denies any headache, chest pain, fever, chills, nausea, vomiting  Objective: Vital Signs: There were no vitals taken for this visit.  Physical Exam She is alert and oriented 3 in no acute distress. Her signs with her Ortho Exam Any attempts of range of motion right shoulder call severe pain. There is significant weakness in the shoulder. She has a weak grip strength on the right side. She has neck pain as well. She does have positive Phalen's and Tinel sign on the right wrist. Specialty Comments:  No specialty comments available.  Imaging: No  results found. I did in the patellar review the CT scan of her neck and does show significant disease in terms or arthritic changes at C6 area as well as C4-C5. These affecting mainly the right and are quite severe in terms of foraminal stenosis and narrowing.  PMFS History: Patient Active Problem List   Diagnosis Date Noted  . Carpal tunnel syndrome, right upper limb 12/14/2016  . Complete tear of right rotator cuff 10/21/2016  . Gait abnormality 08/28/2016  . Chest pain 11/23/2015  . ESRD on dialysis ([place])   . Type 2 diabetes mellitus with complication ([place])   .  Essential hypertension   . Abnormal ECG   . Pain in the chest   . Hypotension 10/30/2014  . Abnormal EKG 10/30/2014  . 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 End stage renal disease ([place]) 07/18/2012  . Other complications due to renal dialysis device, implant, and graft 07/18/2012   Past Medical History:  Diagnosis Date  . Anemia   . CHF (congestive heart failure) ([place])   . Childhood asthma   . Chronic upper back pain   . Coronary artery disease    Stent to left main coronary artery second of August 2017  . ESRD (end stage renal disease) on dialysis ([place])    "TTS; [facility] in [place]" (10/30/2014)  . Gait abnormality 08/28/2016  . GERD (gastroesophageal reflux disease)   . Headache    "a few times/week" (10/30/2014)  . History of blood transfusion 1996   "related to menses"  . Hyperlipidemia   . Hypertension   . Hypothyroidism   . IBS (irritable bowel syndrome)   . Myocardial infarction ([place]) 2004  . Osteoarthritis   . Pneumonia    "years ago"  . Stroke ([place]) 07/2016   mini stroke , right side of body  . Type II diabetes mellitus ([facility])     Family History  Problem Relation Age of Onset  . Epilepsy Mother   . Diabetes Mother   . Cancer Mother   . Alcohol abuse Father     Past Surgical History:  Procedure Laterality Date  . APPENDECTOMY    . AV FISTULA PLACEMENT Left 12/03/2006   "forearm"  . AV FISTULA REPAIR Left 11/25/2009   "forearm"  . CHOLECYSTECTOMY OPEN  1970's  . COLONOSCOPY W/ POLYPECTOMY    . CORONARY ANGIOPLASTY    . CORONARY ANGIOPLASTY WITH STENT PLACEMENT    . HERNIA REPAIR    . INSERTION OF DIALYSIS CATHETER Right 2010   "chest"  . PARATHYROIDECTOMY  2010?   Parathyroid autotransplantation   . REVISON OF ARTERIOVENOUS FISTULA Left 12/04/2013   Procedure: REVISON OF ARTERIOVENOUS FISTULA;  Surgeon: Rosetta Posner, MD;  Location: [facility];  Service: Vascular;  Laterality: Left;  . REVISON OF ARTERIOVENOUS FISTULA Left 11/11/2016     Procedure: PLICATION OF LEFT BRACHIOCEPHALIC  ARTERIOVENOUS FISTULA;  Surgeon: Conrad [place], MD;  Location: [facility];  Service: Vascular;  Laterality: Left;  . TUBAL LIGATION  1989  . UMBILICAL HERNIA REPAIR  1990's X 2   Social History   Occupational History  . Disabled    Social History Main Topics  . Smoking status: Former Smoker    Years: 3.00    Types: Cigarettes  . Smokeless tobacco: Never Used     Comment: "stopped smoking in the 1970's"  . Alcohol use No  . Drug use: Yes    Types: Marijuana     Comment: as a teenager  .  Sexual activity: Not Currently

## 2016-12-16 ENCOUNTER — Encounter: Payer: Self-pay | Admitting: Vascular Surgery

## 2016-12-16 ENCOUNTER — Ambulatory Visit (INDEPENDENT_AMBULATORY_CARE_PROVIDER_SITE_OTHER): Payer: Self-pay | Admitting: Vascular Surgery

## 2016-12-16 VITALS — BP 146/74 | HR 69 | Temp 98.1°F | Resp 16 | Ht <= 58 in | Wt 198.0 lb

## 2016-12-16 DIAGNOSIS — T82898D Other specified complication of vascular prosthetic devices, implants and grafts, subsequent encounter: Secondary | ICD-10-CM

## 2016-12-23 ENCOUNTER — Ambulatory Visit (HOSPITAL_BASED_OUTPATIENT_CLINIC_OR_DEPARTMENT_OTHER)
Admission: RE | Admit: 2016-12-23 | Discharge: 2016-12-23 | Disposition: A | Discharge status: Home / Self Care | Payer: Medicare Other | Source: Ambulatory Visit | Attending: Cardiology | Admitting: Cardiology

## 2016-12-23 DIAGNOSIS — I517 Cardiomegaly: Secondary | ICD-10-CM | POA: Insufficient documentation

## 2016-12-23 DIAGNOSIS — R9431 Abnormal electrocardiogram [ECG] [EKG]: Secondary | ICD-10-CM

## 2016-12-23 DIAGNOSIS — I350 Nonrheumatic aortic (valve) stenosis: Secondary | ICD-10-CM | POA: Insufficient documentation

## 2016-12-23 NOTE — Progress Notes (Signed)
  Echocardiogram 2D Echocardiogram has been performed.  Donata Clay 12/23/2016, 11:30 AM

## 2016-12-24 LAB — LIPID PANEL
CHOL/HDL RATIO: 4.3 ratio (ref 0.0–4.4)
CHOLESTEROL TOTAL: 188 mg/dL (ref 100–199)
HDL: 44 mg/dL (ref >39)
LDL CALC: 100 mg/dL — AB (ref 0–99)
TRIGLYCERIDES: 218 mg/dL — AB (ref 0–149)
VLDL Cholesterol Cal: 44 mg/dL — ABNORMAL HIGH (ref 5–40)

## 2016-12-25 ENCOUNTER — Telehealth: Payer: Self-pay

## 2016-12-25 NOTE — Telephone Encounter (Signed)
P/c with patient son, he is on hippa and was made aware results for labs in but not yet reviewed by doctor , will have to call them back monday afternoon, he stated verbal understanding and agreed.cn

## 2016-12-28 ENCOUNTER — Telehealth: Payer: Self-pay

## 2016-12-28 MED ORDER — PRAVASTATIN SODIUM 20 MG PO TABS: 20.0000 mg | ORAL_TABLET | Freq: Every evening | ORAL | 6 refills | Status: DC

## 2016-12-28 NOTE — Telephone Encounter (Signed)
S.w pt regarding her results and the start of pravastatin advised by Dr. Agustin Cree. Pt verbalized understanding and denies any additional questions or concerns at this time. Pt did have question regarding when she could take her last dose of Brilinta. I have advised she continue until she comes back for her follow up in the next 2 weeks and Dr. Agustin Cree could discuss this with her at that time. Pt verbalized understanding.

## 2016-12-28 NOTE — Telephone Encounter (Signed)
-----   Message from Park Liter, MD sent at 12/28/2016  8:12 AM EDT ----- Needs to be on statin, but had problem with Crestor, start Provastatin 20 mg po qd

## 2017-01-08 ENCOUNTER — Encounter (INDEPENDENT_AMBULATORY_CARE_PROVIDER_SITE_OTHER): Payer: Self-pay

## 2017-01-08 ENCOUNTER — Ambulatory Visit (INDEPENDENT_AMBULATORY_CARE_PROVIDER_SITE_OTHER): Payer: Medicare Other | Admitting: Cardiology

## 2017-01-08 ENCOUNTER — Ambulatory Visit (INDEPENDENT_AMBULATORY_CARE_PROVIDER_SITE_OTHER): Payer: Medicare Other | Admitting: Neurology

## 2017-01-08 ENCOUNTER — Encounter: Payer: Self-pay | Admitting: Neurology

## 2017-01-08 ENCOUNTER — Encounter: Payer: Self-pay | Admitting: Cardiology

## 2017-01-08 VITALS — BP 149/80 | HR 70

## 2017-01-08 VITALS — BP 122/62 | HR 68 | Resp 10 | Ht 59.0 in | Wt 190.0 lb

## 2017-01-08 DIAGNOSIS — N186 End stage renal disease: Secondary | ICD-10-CM

## 2017-01-08 DIAGNOSIS — I10 Essential (primary) hypertension: Secondary | ICD-10-CM | POA: Diagnosis not present

## 2017-01-08 DIAGNOSIS — G5601 Carpal tunnel syndrome, right upper limb: Secondary | ICD-10-CM

## 2017-01-08 DIAGNOSIS — G4489 Other headache syndrome: Secondary | ICD-10-CM

## 2017-01-08 DIAGNOSIS — Z992 Dependence on renal dialysis: Secondary | ICD-10-CM | POA: Diagnosis not present

## 2017-01-08 DIAGNOSIS — R269 Unspecified abnormalities of gait and mobility: Secondary | ICD-10-CM | POA: Diagnosis not present

## 2017-01-08 DIAGNOSIS — Z794 Long term (current) use of insulin: Secondary | ICD-10-CM | POA: Diagnosis not present

## 2017-01-08 DIAGNOSIS — E118 Type 2 diabetes mellitus with unspecified complications: Secondary | ICD-10-CM | POA: Diagnosis not present

## 2017-01-08 HISTORY — DX: Other headache syndrome: G44.89

## 2017-01-08 MED ORDER — GABAPENTIN 100 MG PO CAPS: 100.0000 mg | ORAL_CAPSULE | Freq: Every day | ORAL | 3 refills | Status: DC

## 2017-01-08 NOTE — Progress Notes (Signed)
Cardiology Office Note:    Date:  01/08/2017   ID:  Roberta Bryant, DOB [birth year], MRN [id number]  PCP:  Valaria Good, PA-C  Cardiologist:  [name], MD    Referring MD: Valaria Good, PA-C   Chief Complaint  Patient presents with  . 1 month follow up  I'm weak and tired  History of Present Illness:    Roberta Bryant is a 66 y.o. female  with coronary artery disease. Cardiac-wise appears to be doing well. Denies having any chest pain tightness squeezing pressure burning chest. She did have echocardiogram which luckily showed preserved ejection fraction. Aortic stenosis has being assessed as moderate. No intervention is needed from the moment. She was recently diagnosed with carpal tunnel syndrome and she is scheduled to have some injections. Also surgery is contemplated. Complains again of having pain in the left shoulder discomfort second sample pain that she had for years it is clearly worse with moving of her shoulder. It is clearly worse with touching that area. I suspected related to dilated vein because of AV shunt.  Past Medical History:  Diagnosis Date  . Anemia   . CHF (congestive heart failure) ([place])   . Childhood asthma   . Chronic upper back pain   . Coronary artery disease    Stent to left main coronary artery second of August 2017  . ESRD (end stage renal disease) on dialysis ([place])    "TTS; [facility] in [place]" (10/30/2014)  . Gait abnormality 08/28/2016  . GERD (gastroesophageal reflux disease)   . Headache    "a few times/week" (10/30/2014)  . Headache syndrome 01/08/2017  . History of blood transfusion 1996   "related to menses"  . Hyperlipidemia   . Hypertension   . Hypothyroidism   . IBS (irritable bowel syndrome)   . Myocardial infarction ([place]) 2004  . Osteoarthritis   . Pneumonia    "years ago"  . Stroke ([place]) 07/2016   mini stroke , right side of body  . Type II diabetes mellitus ([place])     Past Surgical History:  Procedure  Laterality Date  . APPENDECTOMY    . AV FISTULA PLACEMENT Left 12/03/2006   "forearm"  . AV FISTULA REPAIR Left 11/25/2009   "forearm"  . CHOLECYSTECTOMY OPEN  1970's  . COLONOSCOPY W/ POLYPECTOMY    . CORONARY ANGIOPLASTY    . CORONARY ANGIOPLASTY WITH STENT PLACEMENT    . HERNIA REPAIR    . INSERTION OF DIALYSIS CATHETER Right 2010   "chest"  . PARATHYROIDECTOMY  2010?   Parathyroid autotransplantation   . REVISON OF ARTERIOVENOUS FISTULA Left 12/04/2013   Procedure: REVISON OF ARTERIOVENOUS FISTULA;  Surgeon: Rosetta Posner, MD;  Location: [facility];  Service: Vascular;  Laterality: Left;  . REVISON OF ARTERIOVENOUS FISTULA Left 09/12/9627   Procedure: PLICATION OF LEFT BRACHIOCEPHALIC  ARTERIOVENOUS FISTULA;  Surgeon: Conrad Springville, MD;  Location: [facility];  Service: Vascular;  Laterality: Left;  . TUBAL LIGATION  1989  . UMBILICAL HERNIA REPAIR  1990's X 2    Current Medications: Current Meds  Medication Sig  . acetaminophen (TYLENOL) 325 MG tablet Take 650 mg by mouth every 6 (six) hours as needed for moderate pain or headache.   Marland Kitchen aspirin EC 81 MG tablet Take 81 mg by mouth every morning.  . calcium acetate (PHOSLO) 667 MG capsule Take 2-4 capsules by mouth 4 (four) times daily. 4 with meals and 2 with snacks  . carvedilol (COREG) 12.5 MG tablet  Take 25 mg by mouth at bedtime.   . docusate sodium (COLACE) 100 MG capsule Take 100 mg by mouth 2 (two) times daily.  Marland Kitchen ezetimibe (ZETIA) 10 MG tablet Take 10 mg by mouth daily.  . Fenofibrate 120 MG TABS Take 1 tablet (120 mg total) by mouth daily.  . finasteride (PROSCAR) 5 MG tablet Take 2.5 mg by mouth daily.  Marland Kitchen gabapentin (NEURONTIN) 100 MG capsule Take 1 capsule (100 mg total) by mouth at bedtime.  Marland Kitchen levothyroxine (SYNTHROID, LEVOTHROID) 50 MCG tablet Take 50 mcg by mouth daily before breakfast.   . liraglutide (VICTOZA) 18 MG/3ML SOPN Inject into the skin.  Marland Kitchen loperamide (IMODIUM A-D) 2 MG tablet Take 2 mg by mouth 4 (four) times daily  as needed for diarrhea or loose stools.  . meclizine (ANTIVERT) 25 MG tablet Take 12.5-25 mg by mouth 3 (three) times daily as needed for dizziness.   . multivitamin (RENA-VIT) TABS tablet Take 1 tablet by mouth daily.   Marland Kitchen omeprazole (PRILOSEC) 20 MG capsule Take 20 mg by mouth 2 (two) times daily before a meal.  . oxyCODONE (OXY IR/ROXICODONE) 5 MG immediate release tablet Take 1 tablet (5 mg total) by mouth daily as needed for moderate pain.  Marland Kitchen prochlorperazine (COMPAZINE) 5 MG tablet Take 5 mg by mouth every 6 (six) hours as needed for nausea or vomiting (for dialysis).  . sitaGLIPtin (JANUVIA) 25 MG tablet Take 25 mg by mouth daily.  . ticagrelor (BRILINTA) 90 MG TABS tablet Take 1 tablet (90 mg total) by mouth 2 (two) times daily.  Marland Kitchen tiZANidine (ZANAFLEX) 2 MG tablet Take 1 tablet (2 mg total) by mouth 2 (two) times daily.  . Vitamin D, Ergocalciferol, (DRISDOL) 50000 units CAPS capsule Take 50,000 Units by mouth every Sunday.  . Zinc 50 MG CAPS Take 50 mg by mouth every morning.      Allergies:   Crestor [rosuvastatin calcium]; Sulfa antibiotics; Amoxicillin; Ibuprofen; Pravastatin; Statins; Chlorphen-phenyleph-asa; Naldecon senior [guaifenesin]; and Rosuvastatin   Social History   Social History  . Marital status: Divorced    Spouse name: N/A  . Number of children: 4  . Years of education: Some college   Occupational History  . Disabled    Social History Main Topics  . Smoking status: Former Smoker    Years: 3.00    Types: Cigarettes  . Smokeless tobacco: Never Used     Comment: "stopped smoking in the 1970's"  . Alcohol use No  . Drug use: Yes    Types: Marijuana     Comment: as a teenager  . Sexual activity: Not Currently   Other Topics Concern  . None   Social History Narrative   Lives at home with sons   Caffeine use: Coffee-decaf   Right-handed     Family History: The patient's family history includes Alcohol abuse in her father; Cancer in her mother;  Diabetes in her mother; Epilepsy in her mother. ROS:   Please see the history of present illness.    All 14 point review of systems negative except as described per history of present illness  EKGs/Labs/Other Studies Reviewed:      Recent Labs: 08/13/2016: ALT 5 10/08/2016: BUN 28; Creatinine, Ser 5.31; Platelets 272 11/11/2016: Hemoglobin 11.9; Potassium 4.3; Sodium 136  Recent Lipid Panel    Component Value Date/Time   CHOL 188 12/23/2016 0912   TRIG 218 (H) 12/23/2016 0912   HDL 44 12/23/2016 0912   CHOLHDL 4.3 12/23/2016 0912  CHOLHDL 8.4 11/24/2015 0311   VLDL UNABLE TO CALCULATE IF TRIGLYCERIDE OVER 400 mg/dL 11/24/2015 0311   LDLCALC 100 (H) 12/23/2016 0912    Physical Exam:    VS:  BP 122/62   Pulse 68   Resp 10   Ht 4\' 11"  (1.499 m)   Wt 190 lb (86.2 kg) Comment: Wheel Chair, Reported  BMI 38.38 kg/m     Wt Readings from Last 3 Encounters:  01/08/17 190 lb (86.2 kg)  12/16/16 198 lb (89.8 kg)  12/07/16 202 lb (91.6 kg)     GEN:  Well nourished, well developed in no acute distress HEENT: Normal NECK: No JVD; No carotid bruits LYMPHATICS: No lymphadenopathy CARDIAC: RRR, systolic ejection murmur grade 2-3/6 with radiation towards the neck., no rubs, no gallops RESPIRATORY:  Clear to auscultation without rales, wheezing or rhonchi  ABDOMEN: Soft, non-tender, non-distended MUSCULOSKELETAL:  No edema; No deformity  SKIN: Warm and dry LOWER EXTREMITIES: no swelling NEUROLOGIC:  Alert and oriented x 3 PSYCHIATRIC:  Normal affect   ASSESSMENT:    1. Essential hypertension   2. Type 2 diabetes mellitus with complication, with long-term current use of insulin ([place])   3. ESRD on dialysis [facility])    PLAN:    In order of problems listed above:  1. Coronary artery disease: Stable is already year after stent implantation of the left main coronary artery. We'll be able to discontinue Brilinta 2. Type 2 diabetes: Followed by primary care physician.  (Stable. 3. Dialysis: Continue. 4. Lipidemia: She is intolerant to statin will continue with Zetia   Medication Adjustments/Labs and Tests Ordered: Current medicines are reviewed at length with the patient today.  Concerns regarding medicines are outlined above.  No orders of the defined types were placed in this encounter.  Medication changes: No orders of the defined types were placed in this encounter.   Signed, Park Liter, MD, [facility] 01/08/2017 2:30 PM    [place]

## 2017-01-08 NOTE — Patient Instructions (Signed)
Medication Instructions:  Your physician recommends that you continue on your current medications as directed. Please refer to the Current Medication list given to you today.   Labwork: None  Testing/Procedures: None  Follow-Up: Your physician recommends that you schedule a follow-up appointment in: 3 months  Any Other Special Instructions Will Be Listed Below (If Applicable).     If you need a refill on your cardiac medications before your next appointment, please call your pharmacy.   

## 2017-01-08 NOTE — Progress Notes (Signed)
Reason for visit: Headache  Roberta Bryant is an 66 y.o. female  History of present illness:  Roberta Bryant is a 66 year old right-handed Hispanic female with the diagnosis of Parkinson's disease. Upon her initial evaluation it did not appear that the patient had any signs or symptoms of Parkinson's disease, she has been tapered off of her of Sinemet. The patient has not had any change in her functional level. The patient has end-stage renal disease on hemodialysis. She has complained of relatively frequent headaches, she fell in June 2018 and bumped her head, her headaches have been somewhat more frequent since that time. She has undergone a CT scan of the brain that shows extensive white matter disease that is chronic in nature, otherwise no acute changes were seen. The patient has been on tizanidine without much benefit with the headache. She returns for an evaluation. She mainly is complaining today of severe right hand numbness and discomfort, she has had EMG and nerve conduction study confirming right carpal tunnel syndrome. She does not use a wrist splint. She is starting to get some her symptoms on the left hand now. She has right shoulder discomfort due to a right rotator cuff problem, she has gotten steroid injection from Dr. Rush Farmer.    Past Medical History:  Diagnosis Date  . Anemia   . CHF (congestive heart failure) ([place])   . Childhood asthma   . Chronic upper back pain   . Coronary artery disease    Stent to left main coronary artery second of August 2017  . ESRD (end stage renal disease) on dialysis ([place])    "TTS; [facility] in [place]" (10/30/2014)  . Gait abnormality 08/28/2016  . GERD (gastroesophageal reflux disease)   . Headache    "a few times/week" (10/30/2014)  . History of blood transfusion 1996   "related to menses"  . Hyperlipidemia   . Hypertension   . Hypothyroidism   . IBS (irritable bowel syndrome)   . Myocardial infarction ([place]) 2004  .  Osteoarthritis   . Pneumonia    "years ago"  . Stroke ([place]) 07/2016   mini stroke , right side of body  . Type II diabetes mellitus ([place])     Past Surgical History:  Procedure Laterality Date  . APPENDECTOMY    . AV FISTULA PLACEMENT Left 12/03/2006   "forearm"  . AV FISTULA REPAIR Left 11/25/2009   "forearm"  . CHOLECYSTECTOMY OPEN  1970's  . COLONOSCOPY W/ POLYPECTOMY    . CORONARY ANGIOPLASTY    . CORONARY ANGIOPLASTY WITH STENT PLACEMENT    . HERNIA REPAIR    . INSERTION OF DIALYSIS CATHETER Right 2010   "chest"  . PARATHYROIDECTOMY  2010?   Parathyroid autotransplantation   . REVISON OF ARTERIOVENOUS FISTULA Left 12/04/2013   Procedure: REVISON OF ARTERIOVENOUS FISTULA;  Surgeon: Rosetta Posner, MD;  Location: [facility];  Service: Vascular;  Laterality: Left;  . REVISON OF ARTERIOVENOUS FISTULA Left 01/09/6961   Procedure: PLICATION OF LEFT BRACHIOCEPHALIC  ARTERIOVENOUS FISTULA;  Surgeon: Conrad Flowery Branch, MD;  Location: [facility];  Service: Vascular;  Laterality: Left;  . TUBAL LIGATION  1989  . UMBILICAL HERNIA REPAIR  1990's X 2    Family History  Problem Relation Age of Onset  . Epilepsy Mother   . Diabetes Mother   . Cancer Mother   . Alcohol abuse Father     Social history:  reports that she has quit smoking. Her smoking use included Cigarettes. She quit  after 3.00 years of use. She has never used smokeless tobacco. She reports that she uses drugs, including Marijuana. She reports that she does not drink alcohol.    Allergies  Allergen Reactions  . Crestor [Rosuvastatin Calcium] Other (See Comments)    Dizziness, couldn't walk  . Sulfa Antibiotics Anaphylaxis  . Amoxicillin Hives  . Ibuprofen Hives  . Pravastatin Other (See Comments)    UNSPECIFIED REACTION   . Chlorphen-Phenyleph-Asa Rash  . Naldecon Senior [employer]  . Rosuvastatin Other (See Comments)    dizziness    Medications:  Prior to Admission medications   Medication Sig Start Date End Date  Taking? Authorizing Provider  acetaminophen (TYLENOL) 325 MG tablet Take 650 mg by mouth every 6 (six) hours as needed for moderate pain or headache.    Yes [provider]  aspirin EC 81 MG tablet Take 81 mg by mouth every morning.   Yes [provider]  calcium acetate (PHOSLO) 667 MG capsule Take 2-4 capsules by mouth 4 (four) times daily. 4 with meals and 2 with snacks 10/05/16  Yes [provider]  carvedilol (COREG) 12.5 MG tablet Take 25 mg by mouth at bedtime.  08/06/16  Yes [provider]  docusate sodium (COLACE) 100 MG capsule Take 100 mg by mouth 2 (two) times daily.   Yes [provider]  ezetimibe (ZETIA) 10 MG tablet Take 10 mg by mouth daily. 05/10/16  Yes [provider]  Fenofibrate 120 MG TABS Take 1 tablet (120 mg total) by mouth daily. 11/11/16  Yes Virgina Jock A, PA-C  finasteride (PROSCAR) 5 MG tablet Take 2.5 mg by mouth daily.   Yes [provider]  levothyroxine (SYNTHROID, LEVOTHROID) 50 MCG tablet Take 50 mcg by mouth daily before breakfast.    Yes [provider]  liraglutide (VICTOZA) 18 MG/3ML SOPN Inject into the skin.   Yes [provider]  loperamide (IMODIUM A-D) 2 MG tablet Take 2 mg by mouth 4 (four) times daily as needed for diarrhea or loose stools.   Yes [provider]  meclizine (ANTIVERT) 25 MG tablet Take 12.5-25 mg by mouth 3 (three) times daily as needed for dizziness.    Yes [provider]  multivitamin (RENA-VIT) TABS tablet Take 1 tablet by mouth daily.    Yes [provider]  omeprazole (PRILOSEC) 20 MG capsule Take 20 mg by mouth 2 (two) times daily before a meal.   Yes [provider]  oxyCODONE (OXY IR/ROXICODONE) 5 MG immediate release tablet Take 1 tablet (5 mg total) by mouth daily as needed for moderate pain. 11/11/16  Yes Alvia Grove, PA-C  pravastatin (PRAVACHOL) 20 MG tablet Take 1 tablet (20 mg total) by mouth every  evening. 12/28/16 03/28/17 Yes Park Liter, MD  prochlorperazine (COMPAZINE) 5 MG tablet Take 5 mg by mouth every 6 (six) hours as needed for nausea or vomiting (for dialysis).   Yes [provider]  sitaGLIPtin (JANUVIA) 25 MG tablet Take 25 mg by mouth daily.   Yes [provider]  ticagrelor (BRILINTA) 90 MG TABS tablet Take 1 tablet (90 mg total) by mouth 2 (two) times daily. 11/12/16  Yes Virgina Jock A, PA-C  tiZANidine (ZANAFLEX) 2 MG tablet Take 1 tablet (2 mg total) by mouth 2 (two) times daily. 10/05/16  Yes Kathrynn Ducking, MD  Vitamin D, Ergocalciferol, (DRISDOL) 50000 units CAPS capsule Take 50,000 Units by mouth every Sunday.   Yes [provider]  Zinc 50 MG CAPS Take 50 mg by mouth every morning.    Yes [provider]    ROS:  Out of a complete 14 system review of symptoms, the patient complains only of the following symptoms, and all other reviewed systems are negative.  Dizziness, headache, numbness, weakness Walking difficulty, neck pain  Blood pressure (!) 149/80, pulse 70, SpO2 98 %.  Physical Exam  General: The patient is alert and cooperative at the time of the examination. The patient is markedly obese.  Skin: 1+ edema below the knees is seen bilaterally.   Neurologic Exam  Mental status: The patient is alert and oriented x 3 at the time of the examination. The patient has apparent normal recent and remote memory, with an apparently normal attention span and concentration ability.   Cranial nerves: Facial symmetry is present. Speech is normal, no aphasia or dysarthria is noted. Extraocular movements are full. Visual fields are full. The patient has good facial animation.  Motor: The patient has good strength in all 4 extremities. The patient has difficulty with abduction of the right arm.  Sensory examination: Soft touch sensation is symmetric on the face, arms, and legs.  Coordination: The patient has good  finger-nose-finger and heel-to-shin bilaterally.  Gait and station: The patient has a wide-based gait, the patient requires some assistance with standing. Once up, she can walk with assistance. Romberg is negative. Tandem gait was not attempted.  Reflexes: Deep tendon reflexes are symmetric, but are depressed.   CT head and cervical 11/24/16:  IMPRESSION: CT head: Widespread supratentorial small vessel disease. 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 Prior small infarct involving the anterior most aspects of the internal and external capsules on the left. No new gray-white compartment lesion. No acute infarct evident. No mass, hemorrhage, or extra-axial fluid collection. Foci of arterial vascular calcification noted. There is paranasal sinus disease at multiple sites. Patient has had previous partial mastoidectomy on the right.  CT cervical spine: No fracture or spondylolisthesis. Multilevel osteoarthritic change. Aortic atherosclerosis noted as well as calcification in each carotid artery.  * CT scan images were reviewed online. I agree with the written report.    Assessment/Plan:  1. Frequent headache  2. Right greater than left carpal tunnel syndrome  3. Gait disorder  4. Small vessel disease by CT brain  The patient is having a lot of difficulty with her carpal tunnel symptoms. I will refer her to an orthopedic surgery physician. The patient will be placed on low-dose gabapentin, 100 mg daily. The patient has end-stage renal disease and the dose cannot be elevated significantly. The patient will follow-up in 3 or 4 months. A prescription was given for bilateral wrist splints.  Jill Alexanders MD 01/08/2017 11:19 AM  [facility] [address] [place], [place] [address]  Phone [phone] Fax [phone]

## 2017-01-12 ENCOUNTER — Emergency Department (HOSPITAL_COMMUNITY)
Admission: EM | Admit: 2017-01-12 | Discharge: 2017-01-12 | Disposition: A | Discharge status: Home / Self Care | Payer: Medicare Other | Attending: Emergency Medicine | Admitting: Emergency Medicine

## 2017-01-12 ENCOUNTER — Emergency Department (HOSPITAL_COMMUNITY): Payer: Medicare Other

## 2017-01-12 ENCOUNTER — Encounter (HOSPITAL_COMMUNITY): Payer: Self-pay | Admitting: Emergency Medicine

## 2017-01-12 DIAGNOSIS — I509 Heart failure, unspecified: Secondary | ICD-10-CM | POA: Insufficient documentation

## 2017-01-12 DIAGNOSIS — Y939 Activity, unspecified: Secondary | ICD-10-CM | POA: Diagnosis not present

## 2017-01-12 DIAGNOSIS — S93401A Sprain of unspecified ligament of right ankle, initial encounter: Secondary | ICD-10-CM

## 2017-01-12 DIAGNOSIS — Y929 Unspecified place or not applicable: Secondary | ICD-10-CM | POA: Diagnosis not present

## 2017-01-12 DIAGNOSIS — Z87891 Personal history of nicotine dependence: Secondary | ICD-10-CM | POA: Insufficient documentation

## 2017-01-12 DIAGNOSIS — N186 End stage renal disease: Secondary | ICD-10-CM | POA: Diagnosis not present

## 2017-01-12 DIAGNOSIS — X509XXA Other and unspecified overexertion or strenuous movements or postures, initial encounter: Secondary | ICD-10-CM | POA: Diagnosis not present

## 2017-01-12 DIAGNOSIS — Y999 Unspecified external cause status: Secondary | ICD-10-CM | POA: Insufficient documentation

## 2017-01-12 DIAGNOSIS — S99912A Unspecified injury of left ankle, initial encounter: Secondary | ICD-10-CM | POA: Diagnosis present

## 2017-01-12 DIAGNOSIS — I132 Hypertensive heart and chronic kidney disease with heart failure and with stage 5 chronic kidney disease, or end stage renal disease: Secondary | ICD-10-CM | POA: Diagnosis not present

## 2017-01-12 DIAGNOSIS — Z992 Dependence on renal dialysis: Secondary | ICD-10-CM | POA: Insufficient documentation

## 2017-01-12 DIAGNOSIS — S93402A Sprain of unspecified ligament of left ankle, initial encounter: Secondary | ICD-10-CM | POA: Insufficient documentation

## 2017-01-12 MED ORDER — ACETAMINOPHEN 325 MG PO TABS
650.0000 mg | ORAL_TABLET | Freq: Once | ORAL | Status: AC
  Administered 2017-01-12: 650 mg via ORAL
  Filled 2017-01-12: qty 2

## 2017-01-12 NOTE — Discharge Instructions (Signed)
Your caregiver has diagnosed you as suffering from an ankle sprain. Ankle sprain occurs when the ligaments that hold the ankle joint together are stretched or torn. It may take 4 to 6 weeks to heal. °For Activity: Use crutches with non-weight bearing for the first few days. Then, you may walk on your ankle as the pain allows, or as instructed. Start gradually with weight bearing on the affected ankle. Once you can walk pain free, then try jogging. When you can run forwards, then you can try moving side-to-side. If you cannot walk without crutches in one week, you need a re-check. °SEEK IMMEDIATE MEDICAL ATTENTION IF: your toes are numb or tingling, appear gray or blue, or you have severe pain (also elevate leg and loosen splint). ° °

## 2017-01-12 NOTE — ED Triage Notes (Signed)
Pt was on way to dialysis this morning and tripped getting into car and slipped and twisted her right ankle, pt has pain and swelling to right ankle. Pt denies hitting head or any LOC. Pt had full dialysis treatment before coming.

## 2017-01-12 NOTE — ED Notes (Signed)
Patient transported to X-ray 

## 2017-01-12 NOTE — ED Notes (Signed)
Pt stated she didn't want to get into the bed, she wanted to stay in the wheelchair.

## 2017-01-12 NOTE — ED Provider Notes (Signed)
[facility] DEPT Provider Note   CSN: [id number] Arrival date & time: 01/12/17  1237     History   Chief Complaint Chief Complaint  Patient presents with  . Fall    HPI Roberta Bryant is a 66 y.o. female  Who presents for ankle pain. The patient has some Mobility issues secondary to a previous CVA with left-sided hemiparesis. She uses a walker or a wheelchair at all times. Patient states that today she was trying to get in the car to go to dialysis when her foot slipped and she was rolled her left ankle and fell. She denies hitting her head or losing consciousness. She denies any hip, knee, back, or other joint pain. She complains of throbbing pain in the right ankle with swelling along the lateral side. She denies a previous history of injury to this ankle. The patient states that because it is so painful. She is having significant difficulty ambulating at all. She was able to toe touch weight bear after the injury today.  HPI  Past Medical History:  Diagnosis Date  . Anemia   . CHF (congestive heart failure) ([place])   . Childhood asthma   . Chronic upper back pain   . Coronary artery disease    Stent to left main coronary artery second of August 2017  . ESRD (end stage renal disease) on dialysis ([place])    "TTS; [facility] in [place]" (10/30/2014)  . Gait abnormality 08/28/2016  . GERD (gastroesophageal reflux disease)   . Headache    "a few times/week" (10/30/2014)  . Headache syndrome 01/08/2017  . History of blood transfusion 1996   "related to menses"  . Hyperlipidemia   . Hypertension   . Hypothyroidism   . IBS (irritable bowel syndrome)   . Myocardial infarction ([place]) 2004  . Osteoarthritis   . Pneumonia    "years ago"  . Stroke ([place]) 07/2016   mini stroke , right side of body  . Type II diabetes mellitus [facility])     Patient Active Problem List   Diagnosis Date Noted  . Headache syndrome 01/08/2017  . Right carpal tunnel syndrome 12/14/2016  . Complete tear of  right rotator cuff 10/21/2016  . Gait abnormality 08/28/2016  . Chest pain 11/23/2015  . ESRD on dialysis ([place])   . Type 2 diabetes mellitus with complication ([place])   . Essential hypertension   . Abnormal ECG   . Pain in the chest   . Hypotension 10/30/2014  . Abnormal EKG 10/30/2014  . 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 End stage renal disease ([place]) 07/18/2012  . Other complications due to renal dialysis device, implant, and graft 07/18/2012    Past Surgical History:  Procedure Laterality Date  . APPENDECTOMY    . AV FISTULA PLACEMENT Left 12/03/2006   "forearm"  . AV FISTULA REPAIR Left 11/25/2009   "forearm"  . CHOLECYSTECTOMY OPEN  1970's  . COLONOSCOPY W/ POLYPECTOMY    . CORONARY ANGIOPLASTY    . CORONARY ANGIOPLASTY WITH STENT PLACEMENT    . HERNIA REPAIR    . INSERTION OF DIALYSIS CATHETER Right 2010   "chest"  . PARATHYROIDECTOMY  2010?   Parathyroid autotransplantation   . REVISON OF ARTERIOVENOUS FISTULA Left 12/04/2013   Procedure: REVISON OF ARTERIOVENOUS FISTULA;  Surgeon: Rosetta Posner, MD;  Location: [facility];  Service: Vascular;  Laterality: Left;  . REVISON OF ARTERIOVENOUS FISTULA Left 07/15/7410   Procedure: PLICATION OF LEFT BRACHIOCEPHALIC  ARTERIOVENOUS FISTULA;  Surgeon: Conrad [place], MD;  Location: [facility];  Service: Vascular;  Laterality: Left;  . TUBAL LIGATION  1989  . UMBILICAL HERNIA REPAIR  1990's X 2    OB History    No data available       Home Medications    Prior to Admission medications   Medication Sig Start Date End Date Taking? Authorizing Provider  acetaminophen (TYLENOL) 325 MG tablet Take 650 mg by mouth every 6 (six) hours as needed for moderate pain or headache.     [provider]  aspirin EC 81 MG tablet Take 81 mg by mouth every morning.    [provider]  calcium acetate (PHOSLO) 667 MG capsule Take 2-4 capsules by mouth 4 (four) times daily. 4 with meals and 2 with snacks 10/05/16    [provider]  carvedilol (COREG) 12.5 MG tablet Take 25 mg by mouth at bedtime.  08/06/16   [provider]  docusate sodium (COLACE) 100 MG capsule Take 100 mg by mouth 2 (two) times daily.    [provider]  ezetimibe (ZETIA) 10 MG tablet Take 10 mg by mouth daily. 05/10/16   [provider]  Fenofibrate 120 MG TABS Take 1 tablet (120 mg total) by mouth daily. 11/11/16   Alvia Grove, PA-C  finasteride (PROSCAR) 5 MG tablet Take 2.5 mg by mouth daily.    [provider]  gabapentin (NEURONTIN) 100 MG capsule Take 1 capsule (100 mg total) by mouth at bedtime. 01/08/17   Kathrynn Ducking, MD  levothyroxine (SYNTHROID, LEVOTHROID) 50 MCG tablet Take 50 mcg by mouth daily before breakfast.     [provider]  liraglutide (VICTOZA) 18 MG/3ML SOPN Inject into the skin.    [provider]  loperamide (IMODIUM A-D) 2 MG tablet Take 2 mg by mouth 4 (four) times daily as needed for diarrhea or loose stools.    [provider]  meclizine (ANTIVERT) 25 MG tablet Take 12.5-25 mg by mouth 3 (three) times daily as needed for dizziness.     [provider]  multivitamin (RENA-VIT) TABS tablet Take 1 tablet by mouth daily.     [provider]  omeprazole (PRILOSEC) 20 MG capsule Take 20 mg by mouth 2 (two) times daily before a meal.    [provider]  oxyCODONE (OXY IR/ROXICODONE) 5 MG immediate release tablet Take 1 tablet (5 mg total) by mouth daily as needed for moderate pain. 11/11/16   Alvia Grove, PA-C  prochlorperazine (COMPAZINE) 5 MG tablet Take 5 mg by mouth every 6 (six) hours as needed for nausea or vomiting (for dialysis).    [provider]  sitaGLIPtin (JANUVIA) 25 MG tablet Take 25 mg by mouth daily.    [provider]  ticagrelor (BRILINTA) 90 MG TABS tablet Take 1 tablet (90 mg total) by mouth 2 (two) times daily. 11/12/16   Alvia Grove, PA-C  tiZANidine (ZANAFLEX) 2  MG tablet Take 1 tablet (2 mg total) by mouth 2 (two) times daily. 10/05/16   Kathrynn Ducking, MD  Vitamin D, Ergocalciferol, (DRISDOL) 50000 units CAPS capsule Take 50,000 Units by mouth every Sunday.    [provider]  Zinc 50 MG CAPS Take 50 mg by mouth every morning.     [provider]    Family History Family History  Problem Relation Age of Onset  . Epilepsy Mother   . Diabetes Mother   . Cancer Mother   .  Alcohol abuse Father     Social History Social History  Substance Use Topics  . Smoking status: Former Smoker    Years: 3.00    Types: Cigarettes  . Smokeless tobacco: Never Used     Comment: "stopped smoking in the 1970's"  . Alcohol use No     Allergies   Crestor [rosuvastatin calcium]; Sulfa antibiotics; Amoxicillin; Ibuprofen; Pravastatin; Statins; Chlorphen-phenyleph-asa; Naldecon senior [guaifenesin]; and Rosuvastatin   Review of Systems Review of Systems  Ten systems reviewed and are negative for acute change, except as noted in the HPI.    Physical Exam Updated Vital Signs BP (!) 98/52 (BP Location: Right Arm)   Pulse 76   Temp 98.4 F (36.9 C) (Oral)   Resp 17   Ht 4\' 10"  (1.473 m)   Wt 88.2 kg (194 lb 7.1 oz)   SpO2 99%   BMI 40.64 kg/m   Physical Exam  Constitutional: She is oriented to person, place, and time. She appears well-developed and well-nourished. No distress.  HENT:  Head: Normocephalic and atraumatic.  Eyes: Conjunctivae are normal. No scleral icterus.  Neck: Normal range of motion.  Cardiovascular: Normal rate, regular rhythm and normal heart sounds.  Exam reveals no gallop and no friction rub.   No murmur heard. Pulmonary/Chest: Effort normal and breath sounds normal. No respiratory distress.  Abdominal: Soft. Bowel sounds are normal. She exhibits no distension and no mass. There is no tenderness. There is no guarding.  Musculoskeletal: Normal range of motion.  Exam of the injured ankle reveals swelling  and tenderness over the lateral malleolus. No tenderness over the medial aspect of the ankle. The fifth metatarsal is not tender. The ankle joint is intact without excessive opening on stressing.   Neurological: She is alert and oriented to person, place, and time.  Skin: Skin is warm and dry. She is not diaphoretic.  Psychiatric: Her behavior is normal.  Nursing note and vitals reviewed.    ED Treatments / Results  Labs (all labs ordered are listed, but only abnormal results are displayed) Labs Reviewed - No data to display  EKG  EKG Interpretation None       Radiology No results found.  Procedures Procedures (including critical care time)  Medications Ordered in ED Medications - No data to display   Initial Impression / Assessment and Plan / ED Course  I have reviewed the triage vital signs and the nursing notes.  Pertinent labs & imaging results that were available during my care of the patient were reviewed by me and considered in my medical decision making (see chart for details).     The patient has a sprain of her right ankle. She does able to bear weight with a cam walker. She has a walker at home and help with both daytime and nighttime activities from HER 2 sons. Patient also has a wheelchair at her home.  Final Clinical Impressions(s) / ED Diagnoses   Final diagnoses:  Sprain of right ankle, unspecified ligament, initial encounter    New Prescriptions New Prescriptions   No medications on file     Margarita Mail, PA-C 01/12/17 1434    Dorie Rank, MD 01/14/17 1416

## 2017-01-14 ENCOUNTER — Telehealth: Payer: Self-pay | Admitting: Neurology

## 2017-01-14 NOTE — Telephone Encounter (Signed)
I called patient. The patient is unable to tolerate even 100 mg a day of gabapentin, she'll stop the medication, she'll be seen by hand surgeon on 01/20/2017.

## 2017-01-14 NOTE — Telephone Encounter (Signed)
Pt calling re: the gabapentin (NEURONTIN) 100 MG capsule, pt states that it makes her extremely dizzy.  Pt is asking for a call back

## 2017-01-14 NOTE — Addendum Note (Signed)
Addended by: Kathrynn Ducking on: 01/14/2017 11:33 AM   Modules accepted: Orders

## 2017-01-21 ENCOUNTER — Other Ambulatory Visit: Payer: Self-pay | Admitting: Orthopedic Surgery

## 2017-01-25 ENCOUNTER — Ambulatory Visit (INDEPENDENT_AMBULATORY_CARE_PROVIDER_SITE_OTHER): Payer: Medicare Other | Admitting: Orthopaedic Surgery

## 2017-01-25 DIAGNOSIS — M75121 Complete rotator cuff tear or rupture of right shoulder, not specified as traumatic: Secondary | ICD-10-CM | POA: Diagnosis not present

## 2017-01-25 NOTE — Progress Notes (Signed)
The patient is well-known to me. She has chronic severe right shoulder pain and weakness. She has a known full-thickness rotator cuff tear. She is actually having carpal tunnel surgery by Dr. Fredna Dow on September 6 of this year. She does wish to follow that at some point with right shoulder surgery. She understands fully that she has a full-thickness rotator cuff of the shoulder and this is causing severe pain. She'll likely always have weakness in that shoulder. We have tried multiple steroid injections and nothing is helped her pain is severe.  I can move her shoulder around internal rotation rotation passively and she says those were all her pain is in the shoulder moving around. Again her rotator cuff is completely torn and confirmed from an MRI in December of this past year.  Shoulder shoulder model we talked in detail what shoulder surgery involves. She reported her to wait at least a month after her carpal tunnel surgery and I agree with this as well. I do feel that we should work on getting this set up. We would do this at the main hospital since she is a dialysis patient as well. I would also like the option of keeping her overnight and she understands this as well given her medical issues in the past. All questions were encouraged and answered. We would then see her back at one week postoperative.

## 2017-02-05 ENCOUNTER — Encounter (HOSPITAL_BASED_OUTPATIENT_CLINIC_OR_DEPARTMENT_OTHER): Payer: Self-pay | Admitting: *Deleted

## 2017-02-05 NOTE — Progress Notes (Signed)
Chart reviewed by Dr Fransisco Beau, [place] for [facility], pt will receive dialysis day before surgery and will do istat DOS.

## 2017-02-11 ENCOUNTER — Ambulatory Visit (HOSPITAL_BASED_OUTPATIENT_CLINIC_OR_DEPARTMENT_OTHER)
Admission: RE | Admit: 2017-02-11 | Discharge: 2017-02-11 | Disposition: A | Discharge status: Home / Self Care | Payer: Medicare Other | Source: Ambulatory Visit | Attending: Orthopedic Surgery | Admitting: Orthopedic Surgery

## 2017-02-11 ENCOUNTER — Encounter (HOSPITAL_BASED_OUTPATIENT_CLINIC_OR_DEPARTMENT_OTHER)
Admission: RE | Disposition: A | Discharge status: Home / Self Care | Payer: Self-pay | Source: Ambulatory Visit | Attending: Orthopedic Surgery

## 2017-02-11 ENCOUNTER — Encounter (HOSPITAL_BASED_OUTPATIENT_CLINIC_OR_DEPARTMENT_OTHER): Payer: Self-pay | Admitting: Anesthesiology

## 2017-02-11 ENCOUNTER — Ambulatory Visit (HOSPITAL_BASED_OUTPATIENT_CLINIC_OR_DEPARTMENT_OTHER): Payer: Medicare Other | Admitting: Anesthesiology

## 2017-02-11 DIAGNOSIS — J45909 Unspecified asthma, uncomplicated: Secondary | ICD-10-CM | POA: Diagnosis not present

## 2017-02-11 DIAGNOSIS — F1290 Cannabis use, unspecified, uncomplicated: Secondary | ICD-10-CM | POA: Insufficient documentation

## 2017-02-11 DIAGNOSIS — Z992 Dependence on renal dialysis: Secondary | ICD-10-CM | POA: Diagnosis not present

## 2017-02-11 DIAGNOSIS — I2510 Atherosclerotic heart disease of native coronary artery without angina pectoris: Secondary | ICD-10-CM | POA: Diagnosis not present

## 2017-02-11 DIAGNOSIS — Z886 Allergy status to analgesic agent status: Secondary | ICD-10-CM | POA: Insufficient documentation

## 2017-02-11 DIAGNOSIS — I509 Heart failure, unspecified: Secondary | ICD-10-CM | POA: Diagnosis not present

## 2017-02-11 DIAGNOSIS — M1990 Unspecified osteoarthritis, unspecified site: Secondary | ICD-10-CM | POA: Diagnosis not present

## 2017-02-11 DIAGNOSIS — E1122 Type 2 diabetes mellitus with diabetic chronic kidney disease: Secondary | ICD-10-CM | POA: Insufficient documentation

## 2017-02-11 DIAGNOSIS — Z955 Presence of coronary angioplasty implant and graft: Secondary | ICD-10-CM | POA: Insufficient documentation

## 2017-02-11 DIAGNOSIS — E785 Hyperlipidemia, unspecified: Secondary | ICD-10-CM | POA: Insufficient documentation

## 2017-02-11 DIAGNOSIS — I252 Old myocardial infarction: Secondary | ICD-10-CM | POA: Diagnosis not present

## 2017-02-11 DIAGNOSIS — Z87891 Personal history of nicotine dependence: Secondary | ICD-10-CM | POA: Insufficient documentation

## 2017-02-11 DIAGNOSIS — G5603 Carpal tunnel syndrome, bilateral upper limbs: Secondary | ICD-10-CM | POA: Diagnosis not present

## 2017-02-11 DIAGNOSIS — Z888 Allergy status to other drugs, medicaments and biological substances status: Secondary | ICD-10-CM | POA: Insufficient documentation

## 2017-02-11 DIAGNOSIS — N186 End stage renal disease: Secondary | ICD-10-CM | POA: Diagnosis not present

## 2017-02-11 DIAGNOSIS — E039 Hypothyroidism, unspecified: Secondary | ICD-10-CM | POA: Insufficient documentation

## 2017-02-11 DIAGNOSIS — Z8673 Personal history of transient ischemic attack (TIA), and cerebral infarction without residual deficits: Secondary | ICD-10-CM | POA: Insufficient documentation

## 2017-02-11 DIAGNOSIS — I350 Nonrheumatic aortic (valve) stenosis: Secondary | ICD-10-CM | POA: Diagnosis not present

## 2017-02-11 DIAGNOSIS — K219 Gastro-esophageal reflux disease without esophagitis: Secondary | ICD-10-CM | POA: Insufficient documentation

## 2017-02-11 DIAGNOSIS — K589 Irritable bowel syndrome without diarrhea: Secondary | ICD-10-CM | POA: Insufficient documentation

## 2017-02-11 DIAGNOSIS — I132 Hypertensive heart and chronic kidney disease with heart failure and with stage 5 chronic kidney disease, or end stage renal disease: Secondary | ICD-10-CM | POA: Diagnosis not present

## 2017-02-11 DIAGNOSIS — Z880 Allergy status to penicillin: Secondary | ICD-10-CM | POA: Insufficient documentation

## 2017-02-11 HISTORY — DX: Nonrheumatic aortic (valve) stenosis: I35.0

## 2017-02-11 HISTORY — PX: CARPAL TUNNEL RELEASE: SHX101

## 2017-02-11 LAB — POCT I-STAT, CHEM 8
BUN: 12 mg/dL (ref 6–20)
Calcium, Ion: 1.04 mmol/L — ABNORMAL LOW (ref 1.15–1.40)
Chloride: 97 mmol/L — ABNORMAL LOW (ref 101–111)
Creatinine, Ser: 4.8 mg/dL — ABNORMAL HIGH (ref 0.44–1.00)
Glucose, Bld: 97 mg/dL (ref 65–99)
HEMATOCRIT: 36 % (ref 36.0–46.0)
HEMOGLOBIN: 12.2 g/dL (ref 12.0–15.0)
POTASSIUM: 3.9 mmol/L (ref 3.5–5.1)
SODIUM: 141 mmol/L (ref 135–145)
TCO2: 32 mmol/L (ref 22–32)

## 2017-02-11 LAB — GLUCOSE, CAPILLARY: Glucose-Capillary: 119 mg/dL — ABNORMAL HIGH (ref 65–99)

## 2017-02-11 SURGERY — CARPAL TUNNEL RELEASE
Anesthesia: Monitor Anesthesia Care | Site: Wrist | Laterality: Right

## 2017-02-11 MED ORDER — OXYCODONE HCL 5 MG/5ML PO SOLN: 5.0000 mg | Freq: Once | ORAL | Status: DC | PRN

## 2017-02-11 MED ORDER — PROMETHAZINE HCL 25 MG/ML IJ SOLN: 6.2500 mg | INTRAMUSCULAR | Status: DC | PRN

## 2017-02-11 MED ORDER — LACTATED RINGERS IV SOLN: INTRAVENOUS | Status: DC

## 2017-02-11 MED ORDER — FENTANYL CITRATE (PF) 100 MCG/2ML IJ SOLN
INTRAMUSCULAR | Status: AC
  Filled 2017-02-11: qty 2

## 2017-02-11 MED ORDER — SUCCINYLCHOLINE CHLORIDE 200 MG/10ML IV SOSY
PREFILLED_SYRINGE | INTRAVENOUS | Status: AC
  Filled 2017-02-11: qty 10

## 2017-02-11 MED ORDER — FENTANYL CITRATE (PF) 100 MCG/2ML IJ SOLN
50.0000 ug | INTRAMUSCULAR | Status: DC | PRN
  Administered 2017-02-11: 50 ug via INTRAVENOUS

## 2017-02-11 MED ORDER — EPHEDRINE 5 MG/ML INJ
INTRAVENOUS | Status: AC
  Filled 2017-02-11: qty 10

## 2017-02-11 MED ORDER — MIDAZOLAM HCL 2 MG/2ML IJ SOLN
INTRAMUSCULAR | Status: AC
  Filled 2017-02-11: qty 2

## 2017-02-11 MED ORDER — HYDROCODONE-ACETAMINOPHEN 5-325 MG PO TABS: 1.0000 | ORAL_TABLET | Freq: Four times a day (QID) | ORAL | 0 refills | Status: DC | PRN

## 2017-02-11 MED ORDER — SCOPOLAMINE 1 MG/3DAYS TD PT72: 1.0000 | MEDICATED_PATCH | Freq: Once | TRANSDERMAL | Status: DC | PRN

## 2017-02-11 MED ORDER — ONDANSETRON HCL 4 MG/2ML IJ SOLN
INTRAMUSCULAR | Status: AC
  Filled 2017-02-11: qty 2

## 2017-02-11 MED ORDER — PHENYLEPHRINE 40 MCG/ML (10ML) SYRINGE FOR IV PUSH (FOR BLOOD PRESSURE SUPPORT)
PREFILLED_SYRINGE | INTRAVENOUS | Status: AC
  Filled 2017-02-11: qty 10

## 2017-02-11 MED ORDER — OXYCODONE HCL 5 MG PO TABS: 5.0000 mg | ORAL_TABLET | Freq: Once | ORAL | Status: DC | PRN

## 2017-02-11 MED ORDER — LIDOCAINE 2% (20 MG/ML) 5 ML SYRINGE
INTRAMUSCULAR | Status: AC
  Filled 2017-02-11: qty 5

## 2017-02-11 MED ORDER — VANCOMYCIN HCL IN DEXTROSE 1-5 GM/200ML-% IV SOLN
1000.0000 mg | INTRAVENOUS | Status: AC
  Administered 2017-02-11: 1000 mg via INTRAVENOUS

## 2017-02-11 MED ORDER — MIDAZOLAM HCL 2 MG/2ML IJ SOLN
1.0000 mg | INTRAMUSCULAR | Status: DC | PRN
  Administered 2017-02-11: 2 mg via INTRAVENOUS

## 2017-02-11 MED ORDER — ROPIVACAINE HCL 5 MG/ML IJ SOLN
INTRAMUSCULAR | Status: DC | PRN
  Administered 2017-02-11: 30 mL via PERINEURAL

## 2017-02-11 MED ORDER — ONDANSETRON HCL 4 MG/2ML IJ SOLN
INTRAMUSCULAR | Status: DC | PRN
  Administered 2017-02-11: 4 mg via INTRAVENOUS

## 2017-02-11 MED ORDER — PROPOFOL 10 MG/ML IV BOLUS
INTRAVENOUS | Status: DC | PRN
  Administered 2017-02-11 (×3): 20 mg via INTRAVENOUS

## 2017-02-11 MED ORDER — CHLORHEXIDINE GLUCONATE 4 % EX LIQD: 60.0000 mL | Freq: Once | CUTANEOUS | Status: DC

## 2017-02-11 MED ORDER — HYDROMORPHONE HCL 1 MG/ML IJ SOLN: 0.2500 mg | INTRAMUSCULAR | Status: DC | PRN

## 2017-02-11 MED ORDER — BUPIVACAINE HCL (PF) 0.5 % IJ SOLN
INTRAMUSCULAR | Status: AC
  Filled 2017-02-11: qty 60

## 2017-02-11 MED ORDER — VANCOMYCIN HCL IN DEXTROSE 1-5 GM/200ML-% IV SOLN
INTRAVENOUS | Status: AC
  Filled 2017-02-11: qty 200

## 2017-02-11 MED ORDER — SODIUM CHLORIDE 0.9 % IV SOLN
INTRAVENOUS | Status: DC
  Administered 2017-02-11: 08:00:00 via INTRAVENOUS
  Administered 2017-02-11: 10 mL via INTRAVENOUS

## 2017-02-11 SURGICAL SUPPLY — 34 items
BLADE SURG 15 STRL LF DISP TIS (BLADE) ×1 IMPLANT
BLADE SURG 15 STRL SS (BLADE) ×1
BNDG COHESIVE 3X5 TAN STRL LF (GAUZE/BANDAGES/DRESSINGS) ×2 IMPLANT
BNDG ESMARK 4X9 LF (GAUZE/BANDAGES/DRESSINGS) ×2 IMPLANT
BNDG GAUZE ELAST 4 BULKY (GAUZE/BANDAGES/DRESSINGS) ×2 IMPLANT
CHLORAPREP W/TINT 26ML (MISCELLANEOUS) ×2 IMPLANT
CORD BIPOLAR FORCEPS 12FT (ELECTRODE) ×2 IMPLANT
COVER BACK TABLE 60X90IN (DRAPES) ×2 IMPLANT
COVER MAYO STAND STRL (DRAPES) ×2 IMPLANT
CUFF TOURNIQUET SINGLE 18IN (TOURNIQUET CUFF) ×2 IMPLANT
DRAPE EXTREMITY T 121X128X90 (DRAPE) ×2 IMPLANT
DRAPE SURG 17X23 STRL (DRAPES) ×2 IMPLANT
DRSG PAD ABDOMINAL 8X10 ST (GAUZE/BANDAGES/DRESSINGS) ×2 IMPLANT
GAUZE SPONGE 4X4 12PLY STRL (GAUZE/BANDAGES/DRESSINGS) ×2 IMPLANT
GAUZE XEROFORM 1X8 LF (GAUZE/BANDAGES/DRESSINGS) ×2 IMPLANT
GLOVE BIO SURGEON STRL SZ7 (GLOVE) ×4 IMPLANT
GLOVE BIOGEL PI IND STRL 8.5 (GLOVE) ×1 IMPLANT
GLOVE BIOGEL PI INDICATOR 8.5 (GLOVE) ×1
GLOVE SURG ORTHO 8.0 STRL STRW (GLOVE) ×2 IMPLANT
GOWN STRL REUS W/ TWL LRG LVL3 (GOWN DISPOSABLE) IMPLANT
GOWN STRL REUS W/ TWL XL LVL3 (GOWN DISPOSABLE) ×2 IMPLANT
GOWN STRL REUS W/TWL LRG LVL3 (GOWN DISPOSABLE)
GOWN STRL REUS W/TWL XL LVL3 (GOWN DISPOSABLE) ×4 IMPLANT
NEEDLE PRECISIONGLIDE 27X1.5 (NEEDLE) ×2 IMPLANT
NS IRRIG 1000ML POUR BTL (IV SOLUTION) ×2 IMPLANT
PACK BASIN DAY SURGERY FS (CUSTOM PROCEDURE TRAY) ×2 IMPLANT
SLING ARM FOAM STRAP LRG (SOFTGOODS) ×2 IMPLANT
STOCKINETTE 4X48 STRL (DRAPES) ×2 IMPLANT
SUT ETHILON 4 0 PS 2 18 (SUTURE) ×2 IMPLANT
SUT VICRYL 4-0 PS2 18IN ABS (SUTURE) IMPLANT
SYR BULB 3OZ (MISCELLANEOUS) ×2 IMPLANT
SYR CONTROL 10ML LL (SYRINGE) ×2 IMPLANT
TOWEL OR 17X24 6PK STRL BLUE (TOWEL DISPOSABLE) ×2 IMPLANT
UNDERPAD 30X30 (UNDERPADS AND DIAPERS) ×2 IMPLANT

## 2017-02-11 NOTE — Discharge Instructions (Addendum)

## 2017-02-11 NOTE — Transfer of Care (Signed)
Immediate Anesthesia Transfer of Care Note  Patient: Roberta Bryant  Procedure(s) Performed: Procedure(s): RIGHT CARPAL TUNNEL RELEASE (Right)  Patient Location: PACU  Anesthesia Type:MAC and MAC combined with regional for post-op pain  Level of Consciousness: awake, alert  and oriented  Airway & Oxygen Therapy: Patient Spontanous Breathing and Patient connected to face mask oxygen  Post-op Assessment: Report given to RN and Post -op Vital signs reviewed and stable  Post vital signs: Reviewed and stable  Last Vitals:  Vitals:   02/11/17 0850 02/11/17 0937  BP:  (!) 182/67  Pulse: 82 75  Resp: 17   Temp:    SpO2: 100% 99%    Last Pain:  Vitals:   02/11/17 0850  TempSrc:   PainSc: 5       Patients Stated Pain Goal: 3 (82/99/37 1696)  Complications: No apparent anesthesia complications

## 2017-02-11 NOTE — Op Note (Signed)
Roberta Bryant, LAVERGNE            ACCOUNT NO.:  [id number]  MEDICAL RECORD NO.:  [id number]  LOCATION:                                 FACILITY:  PHYSICIAN:  Daryll Brod, M.D.       DATE OF BIRTH:  04-23-1951  DATE OF PROCEDURE:  02/11/2017 DATE OF DISCHARGE:                              OPERATIVE REPORT   PREOPERATIVE DIAGNOSIS:  Carpal tunnel syndrome, right hand.  POSTOPERATIVE DIAGNOSIS:  Carpal tunnel syndrome, right hand.  OPERATION:  Decompression, right median nerve.  SURGEON:  Daryll Brod, M.D.  ANESTHESIA:  Axillary block.  PLACE OF SURGERY[facility]  [facility].  [facility].  HISTORY:  The patient is a 66 year old female with a history of carpal tunnel syndrome.  EMG nerve conduction is positive which has not responded to conservative treatment.  She would like to undergo surgical decompression of the median nerve.  Pre, peri, and postoperative courses have been discussed along with risks and complications.  She is aware that there is no guarantee with the surgery; the possibility of infection; recurrence of injury to arteries, nerves, tendons; incomplete relief of symptoms; dystrophy.  In preoperative area, the patient was seen.  The extremity was marked by both the patient and surgeon. Antibiotic given.  DESCRIPTION OF PROCEDURE:  An axillary block was carried out in the preoperative area, and IVs could not be started on her arm precluding use of forearm-based IV regional anesthetic.  She was brought to the operating room, where she was prepped and draped in a supine position with the right arm free.  A 3-minute dry time was allowed.  Time-out taken confirming the patient and procedure.  A longitudinal incision was made in the right palm, carried down through the subcutaneous tissue. Bleeders were electrocauterized with bipolar.  Prior to the incision, the limb was exsanguinated with an Esmarch bandage.  Tourniquet placed on the forearm was  inflated to 275 mmHg.  The palmar fascia was split. The superficial palmar arch was identified.  The flexor tendon to the ring and little finger was identified.  Retractors were placed protecting median nerve radially, the ulnar nerve ulnarly.  Flexor retinaculum was then incised with sharp dissection on its ulnar border. A right angle and Sewell retractors were placed between skin and forearm fascia.  The forearm fascia was then released for approximately 2 cm proximal to the wrist crease under direct vision.  Canal was explored. An area of compression to the nerve was apparent.  Motor branch entered into muscle distally.  No further lesions were identified.  The wound was irrigated with saline.  The skin closed with interrupted 4-0 nylon sutures.  Sterile compressive dressing with the fingers free was applied.  On deflation of the tourniquet, all fingers immediately pinked.  She was taken to the recovery room for observation in satisfactory condition. She will be discharged to home to return to the [facility] in 1 week, on Norco.          ______________________________ Daryll Brod, M.D.     GK/MEDQ  D:  02/11/2017  T:  02/11/2017  Job:  [id number]

## 2017-02-11 NOTE — Anesthesia Procedure Notes (Signed)
Anesthesia Regional Block: Supraclavicular block   Pre-Anesthetic Checklist: ,, timeout performed, Correct Patient, Correct Site, Correct Laterality, Correct Procedure, Correct Position, site marked, Risks and benefits discussed,  Surgical consent,  Pre-op evaluation,  At surgeon's request and post-op pain management  Laterality: Right  Prep: chloraprep       Needles:  Injection technique: Single-shot  Needle Type: Stimiplex     Needle Length: 9cm  Needle Gauge: 21     Additional Needles:   Procedures: ultrasound guided,,,,,,,,  Narrative:  Start time: 02/11/2017 8:50 AM End time: 02/11/2017 8:55 AM Injection made incrementally with aspirations every 5 mL.  Performed by: Personally  Anesthesiologist: Candida Peeling RAY

## 2017-02-11 NOTE — Anesthesia Preprocedure Evaluation (Addendum)
Anesthesia Evaluation  Patient identified by MRN, date of birth, ID band Patient awake    Reviewed: Allergy & Precautions, NPO status , Patient's Chart, lab work & pertinent test results, reviewed documented beta blocker date and time   History of Anesthesia Complications Negative for: history of anesthetic complications  Airway Mallampati: II  TM Distance: >3 FB Neck ROM: Full    Dental no notable dental hx. (+) Dental Advisory Given   Pulmonary asthma , former smoker,    Pulmonary exam normal breath sounds clear to auscultation       Cardiovascular hypertension, Pt. on home beta blockers + CAD, + Past MI and +CHF  negative cardio ROS Normal cardiovascular exam Rhythm:Regular Rate:Normal     Neuro/Psych  Headaches, TIACVA, Residual Symptoms negative psych ROS   GI/Hepatic Neg liver ROS, GERD  ,  Endo/Other  diabetesHypothyroidism   Renal/GU Renal disease     Musculoskeletal negative musculoskeletal ROS (+) Arthritis ,   Abdominal   Peds  Hematology negative hematology ROS (+) anemia ,   Anesthesia Other Findings Day of surgery medications reviewed with the patient.  Reproductive/Obstetrics                             Anesthesia Physical  Anesthesia Plan  ASA: IV  Anesthesia Plan: Regional and MAC   Post-op Pain Management:    Induction: Intravenous  PONV Risk Score and Plan: 2 and Ondansetron and Midazolam  Airway Management Planned: Simple Face Mask  Additional Equipment:   Intra-op Plan:   Post-operative Plan:   Informed Consent: I have reviewed the patients History and Physical, chart, labs and discussed the procedure including the risks, benefits and alternatives for the proposed anesthesia with the patient or authorized representative who has indicated his/her understanding and acceptance.   Dental advisory given  Plan Discussed with: CRNA and  Anesthesiologist  Anesthesia Plan Comments:         Anesthesia Quick Evaluation  

## 2017-02-11 NOTE — H&P (Signed)
Roberta Bryant is an 66 y.o. female.   Chief Complaint: numbness right hand HPI: Roberta Bryant is a 66 year old right-hand-dominant female referred by Dr. Jannifer Franklin for consultation regarding numbness and tingling of her hands bilaterally right greater than left. She states that the right side she had symptoms 2 years ago in the 1990s her husband would not have this further investigated. She had a stroke in March. She states her right side is gotten worse in the left side is problematic for her. She is not awakened at night with this she complains of all fingers being numb and tingly. States cold makes this worse. She has no history of injury to her neck. She has had a thorough physical by Dr. Jannifer Franklin. She has had nerve conductions done by Dr. Ernestina Patches in June revealing carpal tunnel syndrome bilaterally with a C6 radiculopathy. She has a motor delay of 5 on the left R46 on the right sensory delay of 4 8 on the left and 4 1 on the right. Also complaining of her shoulder. She has a history of diabetes thyroid problems arthritis she has no history of gout. Only history is positive diabetes negative for thyroid problems arthritis and gout. She is not complaining of any significant pain. He is complaining of weakness on her right side which was affected by her stroke. Her numbness and tingling is constant.      Past Medical History:  Diagnosis Date  . Anemia   . Aortic stenosis   . CHF (congestive heart failure) ([place])   . Childhood asthma   . Chronic upper back pain   . Coronary artery disease    Stent to left main coronary artery second of August 2017  . ESRD (end stage renal disease) on dialysis ([place])    "TTS; [facility] in [place]" (10/30/2014)  . Gait abnormality 08/28/2016  . GERD (gastroesophageal reflux disease)   . Headache    "a few times/week" (10/30/2014)  . Headache syndrome 01/08/2017  . History of blood transfusion 1996   "related to menses"  . Hyperlipidemia   . Hypertension   .  Hypothyroidism   . IBS (irritable bowel syndrome)   . Myocardial infarction ([place]) 2004  . Osteoarthritis   . Pneumonia    "years ago"  . Stroke ([place]) 07/2016   mini stroke , right side of body weak  . Type II diabetes mellitus ([place])     Past Surgical History:  Procedure Laterality Date  . APPENDECTOMY    . AV FISTULA PLACEMENT Left 12/03/2006   "forearm"  . AV FISTULA REPAIR Left 11/25/2009   "forearm"  . CHOLECYSTECTOMY OPEN  1970's  . COLONOSCOPY W/ POLYPECTOMY    . CORONARY ANGIOPLASTY    . CORONARY ANGIOPLASTY WITH STENT PLACEMENT    . HERNIA REPAIR    . INSERTION OF DIALYSIS CATHETER Right 2010   "chest"  . PARATHYROIDECTOMY  2010?   Parathyroid autotransplantation   . REVISON OF ARTERIOVENOUS FISTULA Left 12/04/2013   Procedure: REVISON OF ARTERIOVENOUS FISTULA;  Surgeon: Rosetta Posner, MD;  Location: [facility];  Service: Vascular;  Laterality: Left;  . REVISON OF ARTERIOVENOUS FISTULA Left 06/13/1094   Procedure: PLICATION OF LEFT BRACHIOCEPHALIC  ARTERIOVENOUS FISTULA;  Surgeon: Conrad [place], MD;  Location: [facility];  Service: Vascular;  Laterality: Left;  . TUBAL LIGATION  1989  . UMBILICAL HERNIA REPAIR  1990's X 2    Family History  Problem Relation Age of Onset  . Epilepsy Mother   . Diabetes Mother   .  Cancer Mother   . Alcohol abuse Father    Social History:  reports that she has quit smoking. Her smoking use included Cigarettes. She quit after 3.00 years of use. She has never used smokeless tobacco. She reports that she uses drugs, including Marijuana. She reports that she does not drink alcohol.  Allergies:  Allergies  Allergen Reactions  . Crestor [Rosuvastatin Calcium] Other (See Comments)    Dizziness, couldn't walk  . Sulfa Antibiotics Anaphylaxis  . Amoxicillin Hives  . Ibuprofen Hives  . Gabapentin     Dizzy  . Pravastatin Hives and Other (See Comments)    UNSPECIFIED REACTION   . Statins     Most cause Hives  . Chlorphen-Phenyleph-Asa Rash  .  Naldecon Senior [employer]  . Rosuvastatin Other (See Comments)    dizziness    No prescriptions prior to admission.    No results found for this or any previous visit (from the past 48 hour(s)).  No results found.   Pertinent items are noted in HPI.  Height 4\' 10"  (1.473 m), weight 88 kg (194 lb).  General appearance: alert, cooperative and appears stated age Head: Normocephalic, without obvious abnormality Neck: no JVD Resp: clear to auscultation bilaterally Cardio: regular rate and rhythm, S1, S2 normal, no murmur, click, rub or gallop GI: soft, non-tender; bowel sounds normal; no masses,  no organomegaly Extremities: numbness right hand Pulses: 2+ and symmetric Skin: Skin color, texture, turgor normal. No rashes or lesions Neurologic: Grossly normal Incision/Wound: na  Assessment/Plan Assessment:  1. Bilateral carpal tunnel syndrome    Plan:   She has bilateral carpal tunnel syndromes. She does have C6 radiculopathy. We have discussed possibility of injections with her. She is advised that with the injections her sugars may elevate. We have discussed surgical intervention which she would like to proceed with. Prepare and postoperative course are discussed along with risk applications. She is aware that there is no guarantee to the surgery the possibility of infection recurrence injury to arteries nerves tendons complete relief symptoms dystrophy. She is advised that some of her symptoms are coming from her stroke and some from her C6 radiculopathy. She will be scheduled for carpal tunnel release right hand as an outpatient under regional anesthesia.      Mckenzi Buonomo R 02/11/2017, 5:45 AM

## 2017-02-11 NOTE — Anesthesia Postprocedure Evaluation (Signed)
Anesthesia Post Note  Patient: Roberta Bryant  Procedure(s) Performed: Procedure(s) (LRB): RIGHT CARPAL TUNNEL RELEASE (Right)     Patient location during evaluation: PACU Anesthesia Type: Regional and MAC Level of consciousness: awake and alert Pain management: pain level controlled Vital Signs Assessment: post-procedure vital signs reviewed and stable Respiratory status: spontaneous breathing, nonlabored ventilation and respiratory function stable Cardiovascular status: stable and blood pressure returned to baseline Anesthetic complications: no    Last Vitals:  Vitals:   02/11/17 1015 02/11/17 1030  BP: (!) 192/70 (!) 178/78  Pulse: 73 71  Resp: (!) 23 (!) 25  Temp:    SpO2: 97% 94%    Last Pain:  Vitals:   02/11/17 1015  TempSrc:   PainSc: 0-No pain                 [name]

## 2017-02-11 NOTE — Op Note (Signed)
Dictation Number [id number]

## 2017-02-11 NOTE — Progress Notes (Signed)
Assisted Dr. Miller with right, ultrasound guided, supraclavicular block. Side rails up, monitors on throughout procedure. See vital signs in flow sheet. Tolerated Procedure well. 

## 2017-02-11 NOTE — Brief Op Note (Signed)
02/11/2017  9:30 AM  PATIENT:  Roberta Bryant  66 y.o. female  PRE-OPERATIVE DIAGNOSIS:  RIGHT CARPAL TUNNEL SYNDROME   POST-OPERATIVE DIAGNOSIS:  RIGHT CARPAL TUNNEL SYNDROME   PROCEDURE:  Procedure(s): RIGHT CARPAL TUNNEL RELEASE (Right)  SURGEON:  Surgeon(s) and Role:    * Daryll Brod, MD - Primary  PHYSICIAN ASSISTANT:   ASSISTANTS: none   ANESTHESIA:   regional and IV sedation  EBL:  Total I/O In: -  Out: 5 [Blood:5]  BLOOD ADMINISTERED:none  DRAINS: none   LOCAL MEDICATIONS USED:  NONE  SPECIMEN:  No Specimen  DISPOSITION OF SPECIMEN:  N/A  COUNTS:  YES  TOURNIQUET:   Total Tourniquet Time Documented: Forearm (Right) - 12 minutes Total: Forearm (Right) - 12 minutes   DICTATION: .Other Dictation: Dictation Number [id number]  PLAN OF CARE: Discharge to home after PACU  PATIENT DISPOSITION:  PACU - hemodynamically stable.

## 2017-02-12 ENCOUNTER — Encounter (HOSPITAL_BASED_OUTPATIENT_CLINIC_OR_DEPARTMENT_OTHER): Payer: Self-pay | Admitting: Orthopedic Surgery

## 2017-02-24 ENCOUNTER — Telehealth: Payer: Self-pay | Admitting: Neurology

## 2017-02-24 MED ORDER — NORTRIPTYLINE HCL 10 MG PO CAPS: ORAL_CAPSULE | ORAL | 3 refills | Status: DC

## 2017-02-24 NOTE — Telephone Encounter (Signed)
Patient called office in reference to having increasing headaches.  Patient has about 2 headaches a day lasting between half hour/hour without any medication.  Patient is currently taking Tylenol 325mg  and oxycodone 5mg  if needing, but not taken together.  Pharmacy- [facility] [place].  Please call

## 2017-02-24 NOTE — Addendum Note (Signed)
Addended by: Kathrynn Ducking on: 02/24/2017 05:24 PM   Modules accepted: Orders

## 2017-02-24 NOTE — Telephone Encounter (Signed)
I called the patient. She has a several year history of headaches in the vertex of the head and in the temple areas. I will start nortriptyline, get a RV in about 3 weeks.

## 2017-02-25 ENCOUNTER — Telehealth: Payer: Self-pay | Admitting: Neurology

## 2017-02-25 ENCOUNTER — Telehealth: Payer: Self-pay | Admitting: *Deleted

## 2017-02-25 ENCOUNTER — Encounter: Payer: Self-pay | Admitting: Neurology

## 2017-02-25 NOTE — Telephone Encounter (Signed)
I called patient yesterday started low-dose nortriptyline. The patient has a several year history of headaches, the patient will be worked up on a very low dose of nortriptyline starting at 10 mg at night and then going up to 30 mg at night.  The patient was told about the potential side effects including drowsiness, dry mouth, blurred vision, and constipation.  The use of nortriptyline in this patient is deemed to be low risk as the medication is being used in low dose and the patient is being actively followed through this office.  Other medication such as beta blockers, and use of Depakote at high risk given the fact she has diabetes, the beta blockers will impair her ability to feel a hypoglycemic event and the Depakote will increase her chance of weight gain and worsening diabetic control.

## 2017-02-25 NOTE — Telephone Encounter (Signed)
Roberta Bryant @ [employer] called to inform of denial for nortriptyline, she stated based on how it was prescribed.  Beau Fanny will be faxing over the denial , if there are questions she can be reached at [phone]

## 2017-02-25 NOTE — Telephone Encounter (Signed)
I will dictate a letter regarding the nortriptyline.

## 2017-02-25 NOTE — Telephone Encounter (Signed)
Called and LVM for pt to call and make 3 week f/u per CW,MD request.  Can offer 03/17/17 at 12pm, check in 1130am if she calls

## 2017-02-25 NOTE — Telephone Encounter (Signed)
Faxed appeal letter to appeal and [employer] at [phone]. Received confirmation. Awaiting response from insurance.

## 2017-02-25 NOTE — Telephone Encounter (Signed)
Called and spoke with Colletta Maryland from [facility]. She is going to fax denial letter to [phone].  To verify patient insurance info- address does not match in our records. Could verify via son phone number.

## 2017-02-25 NOTE — Telephone Encounter (Signed)
Submitted PA nortriptyline on covermymeds to [employer].Should have response within 72 hours. Key: [id number]  "If [employer] has not responded within the specified timeframe or if you have any questions about your PA submission, contact [employer] directly at [facility]) [phone] or ([place]) [phone]."  Awaiting response.

## 2017-02-26 NOTE — Telephone Encounter (Signed)
Roberta Bryant with [employer] called to confirm they received the denial appeal turn around time is 7 calandar days.  If any additional information needs to be faxed please send to [phone]. FYI

## 2017-03-01 DIAGNOSIS — M79644 Pain in right finger(s): Secondary | ICD-10-CM | POA: Insufficient documentation

## 2017-03-01 DIAGNOSIS — M25639 Stiffness of unspecified wrist, not elsewhere classified: Secondary | ICD-10-CM | POA: Insufficient documentation

## 2017-03-01 DIAGNOSIS — M25649 Stiffness of unspecified hand, not elsewhere classified: Secondary | ICD-10-CM | POA: Insufficient documentation

## 2017-03-01 NOTE — Telephone Encounter (Signed)
[name] is calling from [facility] re: nortriptyline, and the appeal that Dr Jannifer Franklin is trying to get for pt.  Roberta Bryant is asking if there are any other diagnosis other than head aches that pt is experiencing, please call him at [phone]

## 2017-03-01 NOTE — Telephone Encounter (Signed)
I called and talked with Roberta Bryant. The patient also has a history of chronic low back pain, she has diabetes with neuropathy, and she has headache.  The nortriptyline was ordered because of the headache.  They will make a determination regarding the nortriptyline, and get back to me tomorrow.

## 2017-03-01 NOTE — Telephone Encounter (Signed)
Dr Willis- please advise 

## 2017-03-02 NOTE — Telephone Encounter (Signed)
Roberta Bryant with [employer] is calling stating nortriptyline (PAMELOR) 10 MG capsule has been approved for the patient. An authorization letter will be sent to our office and a returned call is not needed unless there are questions.

## 2017-03-02 NOTE — Telephone Encounter (Signed)
Called and spoke with Roberta Bryant at [employer], [place] [place] [phone] (Phone) [phone] (Fax)   Advised appeal approved for rx nortriptyline. He ran claim and it went through. Nothing further needed.

## 2017-03-11 ENCOUNTER — Telehealth: Payer: Self-pay | Admitting: Neurology

## 2017-03-11 NOTE — Telephone Encounter (Signed)
Called and spoke with son. Scheduled appt for 03/17/17 at 730am, check in 715am. Verbalized understanding and appreciation for call.

## 2017-03-11 NOTE — Telephone Encounter (Signed)
Pt called back she spoke with RN at dialysis and Dr Justin Mend thinks it is nerve related bc her BP was up at dialysis today however he did lower her dry weight. Dr Justin Mend suggested she make an appt to f/u with Dr Jannifer Franklin.  Please call to discuss.

## 2017-03-11 NOTE — Telephone Encounter (Signed)
Pt's son called since Tuesday she has a new onset of involuntary jerk with the rt leg. The only recent change is adding nortriptyline (PAMELOR) 10 MG capsule and velphoro 500mg  tid. Please call to advise

## 2017-03-11 NOTE — Telephone Encounter (Signed)
The patient is on hemodialysis. The patient is having myoclonus of the legs, right greater than left that occurs after dialysis, then clears up the next day, but returns after dialysis is done. This is likely not related to nortriptyline, it may be related to an electrolyte disturbance associated with dialysis.  The patient claims that the nephrologist, Dr. Justin Mend as indicated that she has lost too much weight and they will change her dry weight on the dialysis.

## 2017-03-17 ENCOUNTER — Encounter: Payer: Self-pay | Admitting: Neurology

## 2017-03-17 ENCOUNTER — Encounter (INDEPENDENT_AMBULATORY_CARE_PROVIDER_SITE_OTHER): Payer: Self-pay

## 2017-03-17 ENCOUNTER — Ambulatory Visit (INDEPENDENT_AMBULATORY_CARE_PROVIDER_SITE_OTHER): Payer: Medicare Other | Admitting: Neurology

## 2017-03-17 VITALS — BP 142/76 | HR 65 | Ht <= 58 in

## 2017-03-17 DIAGNOSIS — G253 Myoclonus: Secondary | ICD-10-CM

## 2017-03-17 DIAGNOSIS — G4489 Other headache syndrome: Secondary | ICD-10-CM | POA: Diagnosis not present

## 2017-03-17 MED ORDER — CLONAZEPAM 0.5 MG PO TABS: 0.5000 mg | ORAL_TABLET | Freq: Every day | ORAL | 2 refills | Status: DC

## 2017-03-17 NOTE — Patient Instructions (Signed)
   We will get an EEG study and start clonazepam 0.5 mg at night.  Call if the clonazepam is not tolerated. (look out for over-sedation.)

## 2017-03-17 NOTE — Progress Notes (Signed)
Reason for visit: Myoclonus  Roberta Bryant is an 66 y.o. female  History of present illness:  Roberta Bryant is a 66 year old right-handed Hispanic female with a history of end-stage renal disease on hemodialysis, and a history of cerebrovascular disease. The patient has a gait disorder associated with this, she was felt initially to have Parkinson's disease, but there have been no true parkinsonian features at this point. The patient is off of Sinemet. She has recently had carpal tunnel syndrome surgery on the right, it will be done on the left in the future by Dr.Kuzma. The patient is having a lot of pain in the right shoulder and this will require surgery as well. The patient has complained of daily headaches that are bifrontal in nature, she was placed on gabapentin in low-dose but could not tolerate the drug, She stopped the medication. The patient has been placed on nortriptyline taking 10 mg at night, but this has been held as she recently developed myoclonus involving the legs, this may occur independently on one side or the other. The myoclonus has not involved the upper extremities. Usually she will have 3 or 4 such episodes a day, this may occur while sitting or lying down, not while standing or walking. The patient may feel somewhat weak around the time of the episodes of myoclonus. She generally will have more episodes following dialysis, and she is better the day after dialysis. The patient indicates that the dry weight was lowered recently on hemodialysis. The patient returns to the office today for evaluation of the myoclonus. The patient has no confusion or loss of consciousness with the episodes of myoclonus. The patient had been on oxycodone or hydrocodone following surgery, but she does not take the medication now.  Past Medical History:  Diagnosis Date  . Anemia   . Aortic stenosis   . CHF (congestive heart failure) ([place])   . Childhood asthma   . Chronic upper back pain     . Coronary artery disease    Stent to left main coronary artery second of August 2017  . ESRD (end stage renal disease) on dialysis ([place])    "TTS; [facility] in [place]" (10/30/2014)  . Gait abnormality 08/28/2016  . GERD (gastroesophageal reflux disease)   . Headache    "a few times/week" (10/30/2014)  . Headache syndrome 01/08/2017  . History of blood transfusion 1996   "related to menses"  . Hyperlipidemia   . Hypertension   . Hypothyroidism   . IBS (irritable bowel syndrome)   . Myocardial infarction ([place]) 2004  . Osteoarthritis   . Pneumonia    "years ago"  . Stroke ([place]) 07/2016   mini stroke , right side of body weak  . Type II diabetes mellitus ([place])     Past Surgical History:  Procedure Laterality Date  . APPENDECTOMY    . AV FISTULA PLACEMENT Left 12/03/2006   "forearm"  . AV FISTULA REPAIR Left 11/25/2009   "forearm"  . CARPAL TUNNEL RELEASE Right 02/11/2017   Procedure: RIGHT CARPAL TUNNEL RELEASE;  Surgeon: Daryll Brod, MD;  Location: [facility];  Service: Orthopedics;  Laterality: Right;  . CHOLECYSTECTOMY OPEN  1970's  . COLONOSCOPY W/ POLYPECTOMY    . CORONARY ANGIOPLASTY    . CORONARY ANGIOPLASTY WITH STENT PLACEMENT    . HERNIA REPAIR    . INSERTION OF DIALYSIS CATHETER Right 2010   "chest"  . PARATHYROIDECTOMY  2010?   Parathyroid autotransplantation   . REVISON OF  ARTERIOVENOUS FISTULA Left 12/04/2013   Procedure: REVISON OF ARTERIOVENOUS FISTULA;  Surgeon: Rosetta Posner, MD;  Location: [facility];  Service: Vascular;  Laterality: Left;  . REVISON OF ARTERIOVENOUS FISTULA Left 7/0/9628   Procedure: PLICATION OF LEFT BRACHIOCEPHALIC  ARTERIOVENOUS FISTULA;  Surgeon: Conrad [place], MD;  Location: [facility];  Service: Vascular;  Laterality: Left;  . TUBAL LIGATION  1989  . UMBILICAL HERNIA REPAIR  1990's X 2    Family History  Problem Relation Age of Onset  . Epilepsy Mother   . Diabetes Mother   . Cancer Mother   . Alcohol abuse Father      Social history:  reports that she has quit smoking. Her smoking use included Cigarettes. She quit after 3.00 years of use. She has never used smokeless tobacco. She reports that she uses drugs, including Marijuana and Hydromorphone. She reports that she does not drink alcohol.    Allergies  Allergen Reactions  . Crestor [Rosuvastatin Calcium] Other (See Comments)    Dizziness, couldn't walk  . Sulfa Antibiotics Anaphylaxis  . Amoxicillin Hives  . Ibuprofen Hives  . Gabapentin     Dizzy  . Pravastatin Hives and Other (See Comments)    UNSPECIFIED REACTION   . Statins     Most cause Hives  . Chlorphen-Phenyleph-Asa Rash  . [employer]  . Rosuvastatin Other (See Comments)    dizziness    Medications:  Prior to Admission medications   Medication Sig Start Date End Date Taking? Authorizing Provider  acetaminophen (TYLENOL) 325 MG tablet Take 650 mg by mouth every 6 (six) hours as needed for moderate pain or headache.    Yes [provider]  aspirin EC 81 MG tablet Take 81 mg by mouth every morning.   Yes [provider]  carvedilol (COREG) 12.5 MG tablet Take 25 mg by mouth at bedtime.  08/06/16  Yes [provider]  docusate sodium (COLACE) 100 MG capsule Take 100 mg by mouth 2 (two) times daily.   Yes [provider]  ezetimibe (ZETIA) 10 MG tablet Take 10 mg by mouth daily. 05/10/16  Yes [provider]  Fenofibrate 120 MG TABS Take 1 tablet (120 mg total) by mouth daily. 11/11/16  Yes Virgina Jock A, PA-C  finasteride (PROSCAR) 5 MG tablet Take 2.5 mg by mouth daily.   Yes [provider]  HYDROcodone-acetaminophen (NORCO) 5-325 MG tablet Take 1 tablet by mouth every 6 (six) hours as needed. 02/11/17  Yes Daryll Brod, MD  levothyroxine (SYNTHROID, LEVOTHROID) 50 MCG tablet Take 50 mcg by mouth daily before breakfast.    Yes [provider]  liraglutide (VICTOZA) 18 MG/3ML SOPN Inject into the  skin.   Yes [provider]  loperamide (IMODIUM A-D) 2 MG tablet Take 2 mg by mouth 4 (four) times daily as needed for diarrhea or loose stools.   Yes [provider]  meclizine (ANTIVERT) 25 MG tablet Take 12.5-25 mg by mouth 3 (three) times daily as needed for dizziness.    Yes [provider]  multivitamin (RENA-VIT) TABS tablet Take 1 tablet by mouth daily.    Yes [provider]  nortriptyline (PAMELOR) 10 MG capsule Take one capsule at night for one week, then take 2 capsules at night for one week, then take 3 capsules at night 02/24/17  Yes Kathrynn Ducking, MD  omeprazole (PRILOSEC) 20 MG capsule Take 20 mg by mouth 2 (two) times daily before a meal.  Yes [provider]  oxyCODONE (OXY IR/ROXICODONE) 5 MG immediate release tablet Take 1 tablet (5 mg total) by mouth daily as needed for moderate pain. 11/11/16  Yes Alvia Grove, PA-C  prochlorperazine (COMPAZINE) 5 MG tablet Take 5 mg by mouth every 6 (six) hours as needed for nausea or vomiting (for dialysis).   Yes [provider]  sitaGLIPtin (JANUVIA) 25 MG tablet Take 25 mg by mouth daily.   Yes [provider]  tiZANidine (ZANAFLEX) 2 MG tablet Take 1 tablet (2 mg total) by mouth 2 (two) times daily. 10/05/16  Yes Kathrynn Ducking, MD  Zinc 50 MG CAPS Take 50 mg by mouth every morning.    Yes [provider]    ROS:  Out of a complete 14 system review of symptoms, the patient complains only of the following symptoms, and all other reviewed systems are negative.  Jerking  Headache  Weakness   Blood pressure (!) 142/76, pulse 65, height 4\' 10"  (1.473 m), SpO2 98 %.  Physical Exam  General: The patient is alert and cooperative at the time of the examination.The patient is moderately to markedly obese.  Skin: No significant peripheral edema is noted.   Neurologic Exam  Mental status: The patient is alert and oriented x 3 at the time of the  examination. The patient has apparent normal recent and remote memory, with an apparently normal attention span and concentration ability.   Cranial nerves: Facial symmetry is present. Speech is normal, no aphasia or dysarthria is noted. Extraocular movements are full. Visual fields are full.  Motor: The patient has good strength in all 4 extremities.  Sensory examination: Soft touch sensation is symmetric on the face, arms, and legs, with exception that there is a slight decrease in sensation on the right leg as compared to the left.  Coordination: The patient has good finger-nose-finger and heel-to-shin bilaterally. No asterixis is seen. The patient may have occasional mild jerking of the left greater than right leg during the examination.  Gait and station: The patient is able to walk with assistance, gait is wide-based. Romberg is negative. Tandem gait was not attempted.  Reflexes: Deep tendon reflexes are symmetric, but are depressed.   Assessment/Plan:  1. Chronic daily headache   2. History of cerebrovascular disease   3. Gait disturbance   4. Myoclonus of lower extremities, new onset   The etiology of the myoclonus is not clear. The only new medication added was nortriptyline, the patient has been off of this for one week and the symptoms continue. The patient will be placed on low-dose clonazepam taking 0.5 mg at night, she will contact me if this is not well tolerated. We may consider addition of Depakote in the future. The patient will be set up for an EEG study. She will follow-up for her next scheduled appointment.   Jill Alexanders MD 03/17/2017 7:31 AM  [facility] [address] [place], [place] [address]  Phone [phone] Fax [phone]

## 2017-03-23 ENCOUNTER — Inpatient Hospital Stay (INDEPENDENT_AMBULATORY_CARE_PROVIDER_SITE_OTHER): Payer: Medicare Other | Admitting: Orthopaedic Surgery

## 2017-03-31 ENCOUNTER — Telehealth: Payer: Self-pay | Admitting: Neurology

## 2017-03-31 ENCOUNTER — Ambulatory Visit (INDEPENDENT_AMBULATORY_CARE_PROVIDER_SITE_OTHER): Payer: Medicare Other | Admitting: Neurology

## 2017-03-31 DIAGNOSIS — R259 Unspecified abnormal involuntary movements: Secondary | ICD-10-CM

## 2017-03-31 DIAGNOSIS — G253 Myoclonus: Secondary | ICD-10-CM

## 2017-03-31 NOTE — Telephone Encounter (Signed)
I called patient. The EEG study was normal. The patient is on clonazepam for the myoclonic jerks, if this is not effective, she is to let us know.

## 2017-03-31 NOTE — Procedures (Signed)
    History:  Roberta Bryant is a 66 year old patient with a history of end-stage renal disease on hemodialysis. The patient has had episodes of myoclonic jerking of the lower extremities with 3 or 4 such episodes daily. The patient has no alteration of consciousness with these events. She is being evaluated for these episodes.  This is a routine EEG. No skull defects are noted. Medications include aspirin, Coreg, clonazepam, Colace, Zetia, fenofibrate, Proscar, hydrocodone, Synthroid, Victoza, Antivert, multivitamins, Pamelor, oxycodone, Compazine, Januvia, Zanaflex, and zinc supplementation.   EEG classification: Normal awake  Description of the recording: The background rhythms of this recording consists of a fairly well modulated medium amplitude alpha rhythm of 9 Hz that is reactive to eye opening and closure. As the record progresses, the patient appears to remain in the waking state throughout the recording. Photic stimulation was performed, resulting in a bilateral and symmetric photic driving response. Hyperventilation was also performed, resulting in a minimal buildup of the background rhythm activities without significant slowing seen. At no time during the recording does there appear to be evidence of spike or spike wave discharges or evidence of focal slowing. EKG monitor shows no evidence of cardiac rhythm abnormalities with a heart rate of 66.  Impression: This is a normal EEG recording in the waking state. No evidence of ictal or interictal discharges are seen.

## 2017-04-07 ENCOUNTER — Telehealth: Payer: Self-pay | Admitting: *Deleted

## 2017-04-07 NOTE — Telephone Encounter (Signed)
Call from patient c/o "left arm feel tight especially after dialysis." since August.  States arm is "slightly swollen,  temp is normal, itches and no pain with gripping or elevation. Denies any signs of infection. Fistula is used 3x per week for Dialysis and said" the nurse say nothing about  It". Instructed to elevate arm, be sure and talk to the dialysis nurses on Thursday about the arm and have them call us if any problems and call us if condition worsens.

## 2017-04-12 ENCOUNTER — Ambulatory Visit (INDEPENDENT_AMBULATORY_CARE_PROVIDER_SITE_OTHER): Payer: Medicare Other | Admitting: Cardiology

## 2017-04-12 ENCOUNTER — Encounter: Payer: Self-pay | Admitting: Cardiology

## 2017-04-12 VITALS — BP 122/72 | HR 76 | Resp 10 | Ht 59.0 in

## 2017-04-12 DIAGNOSIS — R0782 Intercostal pain: Secondary | ICD-10-CM

## 2017-04-12 DIAGNOSIS — I2510 Atherosclerotic heart disease of native coronary artery without angina pectoris: Secondary | ICD-10-CM

## 2017-04-12 DIAGNOSIS — I350 Nonrheumatic aortic (valve) stenosis: Secondary | ICD-10-CM

## 2017-04-12 DIAGNOSIS — I10 Essential (primary) hypertension: Secondary | ICD-10-CM | POA: Diagnosis not present

## 2017-04-12 NOTE — Patient Instructions (Signed)
Medication Instructions:  Your physician recommends that you continue on your current medications as directed. Please refer to the Current Medication list given to you today.  Labwork: None    Testing/Procedures: Your physician has requested that you have an echocardiogram. Echocardiography is a painless test that uses sound waves to create images of your heart. It provides your doctor with information about the size and shape of your heart and how well your heart's chambers and valves are working. This procedure takes approximately one hour. There are no restrictions for this procedure. Dr. Agustin Cree would like for you to have an echo done prior to your follow up appointment in 4 months.   Follow-Up: Your physician recommends that you schedule a follow-up appointment in: 4 months   Any Other Special Instructions Will Be Listed Below (If Applicable).  Please note that any paperwork needing to be filled out by the provider will need to be addressed at the front desk prior to seeing the provider. Please note that any paperwork FMLA, Disability or other documents regarding health condition is subject to a $25.00 charge that must be received prior to completion of paperwork in the form of a money order or check.    If you need a refill on your cardiac medications before your next appointment, please call your pharmacy.

## 2017-04-12 NOTE — Progress Notes (Signed)
Cardiology Office Note:    Date:  04/12/2017   ID:  Roberta Bryant, DOB [birth year], MRN [id number]  PCP:  Valaria Good, PA-C  Cardiologist:  [name], MD    Referring MD: Valaria Good, PA-C   Chief Complaint  Patient presents with  . Follow-up  Am not feeling well  History of Present Illness:    Roberta Bryant is a 66 y.o. female with multiple medical problems.  Does have coronary artery disease with PTCA and stenting to left main coronary artery more than year ago.  She also has aortic stenosis which likely is not critical.  She is chronically on dialysis.  Does have a lot of complaints.  Recently she had carpal tunnel syndrome surgery which apparently went well but she still unhappy and complained of having a lot of issues with that.  Overall she seems to be somewhat depressed.  She says she wakes up in the morning does not want to do anything.  At the same time she tells me that she can walk and be fairly active.  She is dissatisfied with dialysis she said it is tiring her to go to 3 times a week for 3-1/2 hours.  Past Medical History:  Diagnosis Date  . Anemia   . Aortic stenosis   . CHF (congestive heart failure) ([place])   . Childhood asthma   . Chronic upper back pain   . Coronary artery disease    Stent to left main coronary artery second of August 2017  . ESRD (end stage renal disease) on dialysis ([place])    "TTS; [facility] in [place]" (10/30/2014)  . Gait abnormality 08/28/2016  . GERD (gastroesophageal reflux disease)   . Headache    "a few times/week" (10/30/2014)  . Headache syndrome 01/08/2017  . History of blood transfusion 1996   "related to menses"  . Hyperlipidemia   . Hypertension   . Hypothyroidism   . IBS (irritable bowel syndrome)   . Myocardial infarction ([place]) 2004  . Osteoarthritis   . Pneumonia    "years ago"  . Stroke ([place]) 07/2016   mini stroke , right side of body weak  . Type II diabetes mellitus ([place])     Past Surgical History:   Procedure Laterality Date  . APPENDECTOMY    . AV FISTULA PLACEMENT Left 12/03/2006   "forearm"  . AV FISTULA REPAIR Left 11/25/2009   "forearm"  . CHOLECYSTECTOMY OPEN  1970's  . COLONOSCOPY W/ POLYPECTOMY    . CORONARY ANGIOPLASTY    . CORONARY ANGIOPLASTY WITH STENT PLACEMENT    . HERNIA REPAIR    . INSERTION OF DIALYSIS CATHETER Right 2010   "chest"  . PARATHYROIDECTOMY  2010?   Parathyroid autotransplantation   . TUBAL LIGATION  1989  . UMBILICAL HERNIA REPAIR  1990's X 2    Current Medications: Current Meds  Medication Sig  . acetaminophen (TYLENOL) 325 MG tablet Take 650 mg by mouth every 6 (six) hours as needed for moderate pain or headache.   Marland Kitchen aspirin EC 81 MG tablet Take 81 mg by mouth every morning.  . carvedilol (COREG) 12.5 MG tablet Take 25 mg by mouth at bedtime.   . clonazePAM (KLONOPIN) 0.5 MG tablet Take 1 tablet (0.5 mg total) by mouth at bedtime.  . docusate sodium (COLACE) 100 MG capsule Take 100 mg by mouth 2 (two) times daily.  Marland Kitchen ezetimibe (ZETIA) 10 MG tablet Take 10 mg by mouth daily.  . Fenofibrate 120 MG TABS Take  1 tablet (120 mg total) by mouth daily.  . finasteride (PROSCAR) 5 MG tablet Take 2.5 mg by mouth daily.  Marland Kitchen HYDROcodone-acetaminophen (NORCO) 5-325 MG tablet Take 1 tablet by mouth every 6 (six) hours as needed.  Marland Kitchen levothyroxine (SYNTHROID, LEVOTHROID) 50 MCG tablet Take 50 mcg by mouth daily before breakfast.   . liraglutide (VICTOZA) 18 MG/3ML SOPN Inject into the skin.  Marland Kitchen loperamide (IMODIUM A-D) 2 MG tablet Take 2 mg by mouth 4 (four) times daily as needed for diarrhea or loose stools.  . meclizine (ANTIVERT) 25 MG tablet Take 12.5-25 mg by mouth 3 (three) times daily as needed for dizziness.   . multivitamin (RENA-VIT) TABS tablet Take 1 tablet by mouth daily.   . nortriptyline (PAMELOR) 10 MG capsule Take one capsule at night for one week, then take 2 capsules at night for one week, then take 3 capsules at night  . omeprazole  (PRILOSEC) 20 MG capsule Take 20 mg by mouth 2 (two) times daily before a meal.  . oxyCODONE (OXY IR/ROXICODONE) 5 MG immediate release tablet Take 1 tablet (5 mg total) by mouth daily as needed for moderate pain.  Marland Kitchen prochlorperazine (COMPAZINE) 5 MG tablet Take 5 mg by mouth every 6 (six) hours as needed for nausea or vomiting (for dialysis).  . sitaGLIPtin (JANUVIA) 25 MG tablet Take 25 mg by mouth daily.  Marland Kitchen tiZANidine (ZANAFLEX) 2 MG tablet Take 1 tablet (2 mg total) by mouth 2 (two) times daily.  . traMADol (ULTRAM) 50 MG tablet Take 1 tablet by mouth.  . Zinc 50 MG CAPS Take 50 mg by mouth every morning.      Allergies:   Crestor [rosuvastatin calcium]; Sulfa antibiotics; Amoxicillin; Ibuprofen; Gabapentin; Pravastatin; Statins; Chlorphen-phenyleph-asa; Naldecon senior [guaifenesin]; and Rosuvastatin   Social History   Socioeconomic History  . Marital status: Divorced    Spouse name: None  . Number of children: 4  . Years of education: Some college  . Highest education level: None  Social Needs  . Financial resource strain: None  . Food insecurity - worry: None  . Food insecurity - inability: None  . Transportation needs - medical: None  . Transportation needs - non-medical: None  Occupational History  . Occupation: Disabled  Tobacco Use  . Smoking status: Former Smoker    Years: 3.00    Types: Cigarettes  . Smokeless tobacco: Never Used  . Tobacco comment: "stopped smoking in the 1970's"  Substance and Sexual Activity  . Alcohol use: No  . Drug use: Yes    Types: Marijuana, Hydromorphone    Comment: as a teenager  . Sexual activity: Not Currently  Other Topics Concern  . None  Social History Narrative   Lives at home with sons   Caffeine use: Coffee-decaf   Right-handed     Family History: The patient's family history includes Alcohol abuse in her father; Cancer in her mother; Diabetes in her mother; Epilepsy in her mother. ROS:   Please see the history of  present illness.    All 14 point review of systems negative except as described per history of present illness  EKGs/Labs/Other Studies Reviewed:      Recent Labs: 08/13/2016: ALT 5 10/08/2016: Platelets 272 02/11/2017: BUN 12; Creatinine, Ser 4.80; Hemoglobin 12.2; Potassium 3.9; Sodium 141  Recent Lipid Panel    Component Value Date/Time   CHOL 188 12/23/2016 0912   TRIG 218 (H) 12/23/2016 0912   HDL 44 12/23/2016 0912   CHOLHDL 4.3 12/23/2016  0912   CHOLHDL 8.4 11/24/2015 0311   VLDL UNABLE TO CALCULATE IF TRIGLYCERIDE OVER 400 mg/dL 11/24/2015 0311   LDLCALC 100 (H) 12/23/2016 0912    Physical Exam:    VS:  BP 122/72   Pulse 76   Resp 10   Ht 4\' 11"  (1.499 m)   BMI 38.58 kg/m     Wt Readings from Last 3 Encounters:  02/11/17 191 lb (86.6 kg)  01/12/17 194 lb 7.1 oz (88.2 kg)  01/08/17 190 lb (86.2 kg)     GEN:  Well nourished, well developed in no acute distress HEENT: Normal NECK: No JVD; No carotid bruits LYMPHATICS: No lymphadenopathy CARDIAC: RRR, systolic ejection murmur grade 2/6 best heard at the right upper portion of the sternum, there is also persistent machinery murmur which is related to fistula it is heard best in the upper portion of the sternum, no rubs, no gallops RESPIRATORY:  Clear to auscultation without rales, wheezing or rhonchi  ABDOMEN: Soft, non-tender, non-distended MUSCULOSKELETAL:  No edema; No deformity  SKIN: Warm and dry LOWER EXTREMITIES: no swelling NEUROLOGIC:  Alert and oriented x 3 PSYCHIATRIC:  Normal affect   ASSESSMENT:    1. Essential hypertension   2. Nonrheumatic aortic valve stenosis   3. Coronary artery disease involving native coronary artery of native heart without angina pectoris   4. Intercostal pain    PLAN:    In order of problems listed above:  1.   2. Nonrheumatic aortic valve stenosis: Not critical we will continue present management 3. Coronary artery disease: Stable asymptomatic.  She 4. Intercoastal  atypical chest pain she always had pain in the left shoulder as well as right shoulder.  She does have right shoulder rotator cuff tear there is some plans about fixing this surgically   Medication Adjustments/Labs and Tests Ordered: Current medicines are reviewed at length with the patient today.  Concerns regarding medicines are outlined above.  No orders of the defined types were placed in this encounter.  Medication changes: No orders of the defined types were placed in this encounter.   Signed, Park Liter, MD, [facility] 04/12/2017 10:21 AM    [place]

## 2017-04-14 ENCOUNTER — Ambulatory Visit (INDEPENDENT_AMBULATORY_CARE_PROVIDER_SITE_OTHER): Payer: Medicare Other | Admitting: Orthopaedic Surgery

## 2017-05-05 ENCOUNTER — Ambulatory Visit (HOSPITAL_BASED_OUTPATIENT_CLINIC_OR_DEPARTMENT_OTHER)
Admission: RE | Admit: 2017-05-05 | Discharge: 2017-05-05 | Disposition: A | Discharge status: Home / Self Care | Payer: Medicare Other | Source: Ambulatory Visit | Attending: Cardiology | Admitting: Cardiology

## 2017-05-05 DIAGNOSIS — I5030 Unspecified diastolic (congestive) heart failure: Secondary | ICD-10-CM | POA: Insufficient documentation

## 2017-05-05 DIAGNOSIS — I080 Rheumatic disorders of both mitral and aortic valves: Secondary | ICD-10-CM | POA: Diagnosis not present

## 2017-05-05 DIAGNOSIS — I2510 Atherosclerotic heart disease of native coronary artery without angina pectoris: Secondary | ICD-10-CM | POA: Diagnosis present

## 2017-05-05 DIAGNOSIS — I350 Nonrheumatic aortic (valve) stenosis: Secondary | ICD-10-CM

## 2017-05-05 DIAGNOSIS — I10 Essential (primary) hypertension: Secondary | ICD-10-CM | POA: Diagnosis present

## 2017-05-05 DIAGNOSIS — I420 Dilated cardiomyopathy: Secondary | ICD-10-CM | POA: Diagnosis not present

## 2017-05-05 NOTE — Progress Notes (Signed)
Echocardiogram 2D Echocardiogram has been performed.  Roberta Bryant 05/05/2017, 11:21 AM

## 2017-05-24 ENCOUNTER — Encounter (INDEPENDENT_AMBULATORY_CARE_PROVIDER_SITE_OTHER): Payer: Self-pay | Admitting: Physician Assistant

## 2017-05-24 ENCOUNTER — Ambulatory Visit (INDEPENDENT_AMBULATORY_CARE_PROVIDER_SITE_OTHER): Payer: Medicare Other

## 2017-05-24 ENCOUNTER — Ambulatory Visit (INDEPENDENT_AMBULATORY_CARE_PROVIDER_SITE_OTHER): Payer: Medicare Other | Admitting: Physician Assistant

## 2017-05-24 VITALS — Ht <= 58 in | Wt 188.0 lb

## 2017-05-24 DIAGNOSIS — M25512 Pain in left shoulder: Secondary | ICD-10-CM | POA: Diagnosis not present

## 2017-05-24 DIAGNOSIS — I2510 Atherosclerotic heart disease of native coronary artery without angina pectoris: Secondary | ICD-10-CM

## 2017-05-24 NOTE — Progress Notes (Signed)
Office Visit Note   Patient: Roberta Bryant           Date of Birth: 1951-04-25           MRN: [id number] Visit Date: 05/24/2017              Requested by: Valaria Good, PA-C [address] [place], [place] [address] PCP: Valaria Good, PA-C   Assessment & Plan: Visit Diagnoses:  1. Left shoulder pain, unspecified chronicity     Plan: Recommend that she talk to her renal physician about her symptoms that she is having in the left chest particularly due to the fact that she is having increased fullness and pain in the left arm after dialysis.  She states that the left arm shunt has been replaced twice.  Otherwise we will see her one week status post her right shoulder arthroscopy.  Follow-Up Instructions: Return for 1 WEEK POSTOP.   Orders:  Orders Placed This Encounter  Procedures  . XR Shoulder Left   No orders of the defined types were placed in this encounter.     Procedures: No procedures performed   Clinical Data: No additional findings.   Subjective: Chief Complaint  Patient presents with  . Chest - Pain    HPI Roberta Bryant comes in today due to left chest pain is under arm and radiates into her chest wall.  She states that she is seeing her cardiologist states that there is warm muscle skeletal.  She had the pain on and off for the past 2-3 months.  She tries icy hot without any real relief.  She is unable take NSAIDs.  She is on dialysis.  She states that her left arm pain is been worse right immediately after dialysis.  She denies any radicular symptoms down either arm.  She is set up for a right shoulder arthroscopy due to right shoulder chronic pain and rotator cuff tear also chronic. Review of Systems  Please see HPI otherwise negative Objective: Vital Signs: Ht 4\' 10"  (1.473 m)   Wt 188 lb (85.3 kg)   BMI 39.29 kg/m   Physical Exam  Constitutional: She is oriented to person, place, and time. She appears well-developed and well-nourished.  No distress.  Pulmonary/Chest: Effort normal.  Neurological: She is alert and oriented to person, place, and time.  Skin: She is not diaphoretic.  Psychiatric: She has a normal mood and affect.    Ortho Exam Rotator cuff testing reveals weakness in the right shoulder with external rotation.  Otherwise good rotator cuff strength.  Unable to manipulate right shoulder due to significant pain.  Left shoulder positive impingement.  Empty can test negative on the left unable to perform on the right.  She has tenderness about the left shoulder girdle and into the anterior chest wall to the sternum where she has tenderness.  Left arm no significant erythema, abnormal warmth or edema.  Positive for areas of ecchymosis proximal arm. Specialty Comments:  No specialty comments available.  Imaging: Xr Shoulder Left  Result Date: 05/24/2017 Left shoulder 3 views: Humeral heads well located.  Glenohumeral joint appears well-preserved.  Y-view shows good subacromial space.  The upper chest lung field appears clear.  No apparent rib fractures are seen.  No other bony abnormalities.    PMFS History: Patient Active Problem List   Diagnosis Date Noted  . Aortic stenosis 04/12/2017  . Coronary artery diseaseStatus post PTCA and stenting with drug-eluting stent to left main coronary artery in  the summer 2017 04/12/2017  . 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 Headache syndrome 01/08/2017  . Right carpal tunnel syndrome 12/14/2016  . Complete tear of right rotator cuff 10/21/2016  . Gait abnormality 08/28/2016  . Chest pain 11/23/2015  . ESRD on dialysis ([place])   . Type 2 diabetes mellitus with complication ([place])   . Essential hypertension   . Abnormal ECG   . Pain in the chest   . Hypotension 10/30/2014  . Abnormal EKG 10/30/2014  . 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 End stage renal disease ([place]) 07/18/2012  . Other complications due to renal dialysis device, implant, and graft  07/18/2012   Past Medical History:  Diagnosis Date  . Anemia   . Aortic stenosis   . CHF (congestive heart failure) ([place])   . Childhood asthma   . Chronic upper back pain   . Coronary artery disease    Stent to left main coronary artery second of August 2017  . ESRD (end stage renal disease) on dialysis ([place])    "TTS; [facility] in [place]" (10/30/2014)  . Gait abnormality 08/28/2016  . GERD (gastroesophageal reflux disease)   . Headache    "a few times/week" (10/30/2014)  . Headache syndrome 01/08/2017  . History of blood transfusion 1996   "related to menses"  . Hyperlipidemia   . Hypertension   . Hypothyroidism   . IBS (irritable bowel syndrome)   . Myocardial infarction ([place]) 2004  . Osteoarthritis   . Pneumonia    "years ago"  . Stroke ([place]) 07/2016   mini stroke , right side of body weak  . Type II diabetes mellitus ([facility])     Family History  Problem Relation Age of Onset  . Epilepsy Mother   . Diabetes Mother   . Cancer Mother   . Alcohol abuse Father     Past Surgical History:  Procedure Laterality Date  . APPENDECTOMY    . AV FISTULA PLACEMENT Left 12/03/2006   "forearm"  . AV FISTULA REPAIR Left 11/25/2009   "forearm"  . CARPAL TUNNEL RELEASE Right 02/11/2017   Procedure: RIGHT CARPAL TUNNEL RELEASE;  Surgeon: Daryll Brod, MD;  Location: [facility];  Service: Orthopedics;  Laterality: Right;  . CHOLECYSTECTOMY OPEN  1970's  . COLONOSCOPY W/ POLYPECTOMY    . CORONARY ANGIOPLASTY    . CORONARY ANGIOPLASTY WITH STENT PLACEMENT    . HERNIA REPAIR    . INSERTION OF DIALYSIS CATHETER Right 2010   "chest"  . PARATHYROIDECTOMY  2010?   Parathyroid autotransplantation   . REVISON OF ARTERIOVENOUS FISTULA Left 12/04/2013   Procedure: REVISON OF ARTERIOVENOUS FISTULA;  Surgeon: Rosetta Posner, MD;  Location: [facility];  Service: Vascular;  Laterality: Left;  . REVISON OF ARTERIOVENOUS FISTULA Left 06/15/5629   Procedure: PLICATION OF LEFT BRACHIOCEPHALIC   ARTERIOVENOUS FISTULA;  Surgeon: Conrad , MD;  Location: [facility];  Service: Vascular;  Laterality: Left;  . TUBAL LIGATION  1989  . UMBILICAL HERNIA REPAIR  1990's X 2   Social History   Occupational History  . Occupation: Disabled  Tobacco Use  . Smoking status: Former Smoker    Years: 3.00    Types: Cigarettes  . Smokeless tobacco: Never Used  . Tobacco comment: "stopped smoking in the 1970's"  Substance and Sexual Activity  . Alcohol use: No  . Drug use: Yes    Types: Marijuana, Hydromorphone    Comment: as a teenager  . Sexual activity: Not Currently

## 2017-05-28 ENCOUNTER — Emergency Department (HOSPITAL_COMMUNITY)
Admission: EM | Admit: 2017-05-28 | Discharge: 2017-05-28 | Disposition: A | Discharge status: Home / Self Care | Payer: Medicare Other | Attending: Emergency Medicine | Admitting: Emergency Medicine

## 2017-05-28 ENCOUNTER — Emergency Department (HOSPITAL_COMMUNITY): Payer: Medicare Other

## 2017-05-28 ENCOUNTER — Encounter (HOSPITAL_COMMUNITY): Payer: Self-pay

## 2017-05-28 DIAGNOSIS — Z79899 Other long term (current) drug therapy: Secondary | ICD-10-CM | POA: Diagnosis not present

## 2017-05-28 DIAGNOSIS — M5030 Other cervical disc degeneration, unspecified cervical region: Secondary | ICD-10-CM | POA: Insufficient documentation

## 2017-05-28 DIAGNOSIS — Z8673 Personal history of transient ischemic attack (TIA), and cerebral infarction without residual deficits: Secondary | ICD-10-CM | POA: Insufficient documentation

## 2017-05-28 DIAGNOSIS — I2510 Atherosclerotic heart disease of native coronary artery without angina pectoris: Secondary | ICD-10-CM | POA: Diagnosis not present

## 2017-05-28 DIAGNOSIS — E119 Type 2 diabetes mellitus without complications: Secondary | ICD-10-CM | POA: Insufficient documentation

## 2017-05-28 DIAGNOSIS — N186 End stage renal disease: Secondary | ICD-10-CM | POA: Diagnosis not present

## 2017-05-28 DIAGNOSIS — E785 Hyperlipidemia, unspecified: Secondary | ICD-10-CM | POA: Diagnosis not present

## 2017-05-28 DIAGNOSIS — I252 Old myocardial infarction: Secondary | ICD-10-CM | POA: Diagnosis not present

## 2017-05-28 DIAGNOSIS — Z992 Dependence on renal dialysis: Secondary | ICD-10-CM | POA: Insufficient documentation

## 2017-05-28 DIAGNOSIS — I132 Hypertensive heart and chronic kidney disease with heart failure and with stage 5 chronic kidney disease, or end stage renal disease: Secondary | ICD-10-CM | POA: Diagnosis not present

## 2017-05-28 DIAGNOSIS — E039 Hypothyroidism, unspecified: Secondary | ICD-10-CM | POA: Diagnosis not present

## 2017-05-28 DIAGNOSIS — I509 Heart failure, unspecified: Secondary | ICD-10-CM | POA: Insufficient documentation

## 2017-05-28 DIAGNOSIS — Z7984 Long term (current) use of oral hypoglycemic drugs: Secondary | ICD-10-CM | POA: Insufficient documentation

## 2017-05-28 DIAGNOSIS — M542 Cervicalgia: Secondary | ICD-10-CM

## 2017-05-28 DIAGNOSIS — Z7982 Long term (current) use of aspirin: Secondary | ICD-10-CM | POA: Insufficient documentation

## 2017-05-28 DIAGNOSIS — Z87891 Personal history of nicotine dependence: Secondary | ICD-10-CM | POA: Diagnosis not present

## 2017-05-28 LAB — COMPREHENSIVE METABOLIC PANEL
ALK PHOS: 65 U/L (ref 38–126)
ALT: 15 U/L (ref 14–54)
AST: 25 U/L (ref 15–41)
Albumin: 3.7 g/dL (ref 3.5–5.0)
Anion gap: 14 (ref 5–15)
BILIRUBIN TOTAL: 0.6 mg/dL (ref 0.3–1.2)
BUN: 37 mg/dL — ABNORMAL HIGH (ref 6–20)
CALCIUM: 8.1 mg/dL — AB (ref 8.9–10.3)
CO2: 26 mmol/L (ref 22–32)
CREATININE: 7.34 mg/dL — AB (ref 0.44–1.00)
Chloride: 97 mmol/L — ABNORMAL LOW (ref 101–111)
GFR, EST AFRICAN AMERICAN: 6 mL/min — AB (ref >60)
GFR, EST NON AFRICAN AMERICAN: 5 mL/min — AB (ref >60)
Glucose, Bld: 143 mg/dL — ABNORMAL HIGH (ref 65–99)
Potassium: 4.4 mmol/L (ref 3.5–5.1)
SODIUM: 137 mmol/L (ref 135–145)
TOTAL PROTEIN: 6.9 g/dL (ref 6.5–8.1)

## 2017-05-28 LAB — CBC WITH DIFFERENTIAL/PLATELET
BASOS ABS: 0.1 10*3/uL (ref 0.0–0.1)
BASOS PCT: 1 %
EOS ABS: 1.2 10*3/uL — AB (ref 0.0–0.7)
Eosinophils Relative: 17 %
HCT: 35.4 % — ABNORMAL LOW (ref 36.0–46.0)
HEMOGLOBIN: 11.6 g/dL — AB (ref 12.0–15.0)
Lymphocytes Relative: 30 %
Lymphs Abs: 2.1 10*3/uL (ref 0.7–4.0)
MCH: 35.5 pg — ABNORMAL HIGH (ref 26.0–34.0)
MCHC: 32.8 g/dL (ref 30.0–36.0)
MCV: 108.3 fL — ABNORMAL HIGH (ref 78.0–100.0)
MONOS PCT: 7 %
Monocytes Absolute: 0.5 10*3/uL (ref 0.1–1.0)
NEUTROS PCT: 45 %
Neutro Abs: 3.1 10*3/uL (ref 1.7–7.7)
Platelets: 217 10*3/uL (ref 150–400)
RBC: 3.27 MIL/uL — ABNORMAL LOW (ref 3.87–5.11)
RDW: 13.5 % (ref 11.5–15.5)
WBC: 7.1 10*3/uL (ref 4.0–10.5)

## 2017-05-28 MED ORDER — HYDROCODONE-ACETAMINOPHEN 5-325 MG PO TABS: 1.0000 | ORAL_TABLET | Freq: Four times a day (QID) | ORAL | 0 refills | Status: DC | PRN

## 2017-05-28 MED ORDER — TRAMADOL HCL 50 MG PO TABS
50.0000 mg | ORAL_TABLET | Freq: Once | ORAL | Status: AC
  Administered 2017-05-28: 50 mg via ORAL
  Filled 2017-05-28: qty 1

## 2017-05-28 NOTE — ED Provider Notes (Signed)
[facility] EMERGENCY DEPARTMENT Provider Note   CSN: [id number] Arrival date & time: 05/28/17  1418     History   Chief Complaint Chief Complaint  Patient presents with  . Neck Pain    HPI Roberta Bryant is a 66 y.o. female.  Patient c/o neck pain for the past 1-2 weeks. Pain constant, dull, mod-severe, worse w turning head, esp to left. No injury or strain. No fever or chills. No skin changes or swelling. No trouble breathing or swallowing. No radicular pain. No numbness or weakness.    The history is provided by the patient.  Neck Pain   Pertinent negatives include no chest pain, no numbness, no headaches and no weakness.    Past Medical History:  Diagnosis Date  . Anemia   . Aortic stenosis   . CHF (congestive heart failure) ([place])   . Childhood asthma   . Chronic upper back pain   . Coronary artery disease    Stent to left main coronary artery second of August 2017  . ESRD (end stage renal disease) on dialysis ([place])    "TTS; [facility] in [place]" (10/30/2014)  . Gait abnormality 08/28/2016  . GERD (gastroesophageal reflux disease)   . Headache    "a few times/week" (10/30/2014)  . Headache syndrome 01/08/2017  . History of blood transfusion 1996   "related to menses"  . Hyperlipidemia   . Hypertension   . Hypothyroidism   . IBS (irritable bowel syndrome)   . Myocardial infarction ([place]) 2004  . Osteoarthritis   . Pneumonia    "years ago"  . Stroke ([place]) 07/2016   mini stroke , right side of body weak  . Type II diabetes mellitus [facility])     Patient Active Problem List   Diagnosis Date Noted  . Aortic stenosis 04/12/2017  . Coronary artery diseaseStatus post PTCA and stenting with drug-eluting stent to left main coronary artery in the summer 2017 04/12/2017  . 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 Headache syndrome 01/08/2017  . Right carpal tunnel syndrome 12/14/2016  . Complete tear of right rotator cuff 10/21/2016  . Gait abnormality  08/28/2016  . Chest pain 11/23/2015  . ESRD on dialysis ([place])   . Type 2 diabetes mellitus with complication ([place])   . Essential hypertension   . Abnormal ECG   . Pain in the chest   . Hypotension 10/30/2014  . Abnormal EKG 10/30/2014  . 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 End stage renal disease ([place]) 07/18/2012  . Other complications due to renal dialysis device, implant, and graft 07/18/2012    Past Surgical History:  Procedure Laterality Date  . APPENDECTOMY    . AV FISTULA PLACEMENT Left 12/03/2006   "forearm"  . AV FISTULA REPAIR Left 11/25/2009   "forearm"  . CARPAL TUNNEL RELEASE Right 02/11/2017   Procedure: RIGHT CARPAL TUNNEL RELEASE;  Surgeon: Daryll Brod, MD;  Location: [facility];  Service: Orthopedics;  Laterality: Right;  . CHOLECYSTECTOMY OPEN  1970's  . COLONOSCOPY W/ POLYPECTOMY    . CORONARY ANGIOPLASTY    . CORONARY ANGIOPLASTY WITH STENT PLACEMENT    . HERNIA REPAIR    . INSERTION OF DIALYSIS CATHETER Right 2010   "chest"  . PARATHYROIDECTOMY  2010?   Parathyroid autotransplantation   . REVISON OF ARTERIOVENOUS FISTULA Left 12/04/2013   Procedure: REVISON OF ARTERIOVENOUS FISTULA;  Surgeon: Rosetta Posner, MD;  Location: [facility];  Service: Vascular;  Laterality: Left;  . REVISON  OF ARTERIOVENOUS FISTULA Left 10/07/7780   Procedure: PLICATION OF LEFT BRACHIOCEPHALIC  ARTERIOVENOUS FISTULA;  Surgeon: Conrad Brightwaters, MD;  Location: [facility];  Service: Vascular;  Laterality: Left;  . TUBAL LIGATION  1989  . UMBILICAL HERNIA REPAIR  1990's X 2    OB History    No data available       Home Medications    Prior to Admission medications   Medication Sig Start Date End Date Taking? Authorizing Provider  acetaminophen (TYLENOL) 325 MG tablet Take 650 mg by mouth every 6 (six) hours as needed for moderate pain or headache.     [provider]  aspirin EC 81 MG tablet Take 81 mg by mouth every morning.    [provider]  carvedilol (COREG) 12.5 MG tablet Take 25 mg by mouth at bedtime.  08/06/16   [provider]  clonazePAM (KLONOPIN) 0.5 MG tablet Take 1 tablet (0.5 mg total) by mouth at bedtime. Patient not taking: Reported on 05/24/2017 03/17/17   Kathrynn Ducking, MD  docusate sodium (COLACE) 100 MG capsule Take 100 mg by mouth 2 (two) times daily.    [provider]  ezetimibe (ZETIA) 10 MG tablet Take 10 mg by mouth daily. 05/10/16   [provider]  Fenofibrate 120 MG TABS Take 1 tablet (120 mg total) by mouth daily. 11/11/16   Alvia Grove, PA-C  finasteride (PROSCAR) 5 MG tablet Take 2.5 mg by mouth daily.    [provider]  HYDROcodone-acetaminophen (NORCO) 5-325 MG tablet Take 1 tablet by mouth every 6 (six) hours as needed. Patient not taking: Reported on 05/24/2017 02/11/17   Daryll Brod, MD  levothyroxine (SYNTHROID, LEVOTHROID) 50 MCG tablet Take 50 mcg by mouth daily before breakfast.     [provider]  liraglutide (VICTOZA) 18 MG/3ML SOPN Inject into the skin.    [provider]  loperamide (IMODIUM A-D) 2 MG tablet Take 2 mg by mouth 4 (four) times daily as needed for diarrhea or loose stools.    [provider]  meclizine (ANTIVERT) 25 MG tablet Take 12.5-25 mg by mouth 3 (three) times daily as needed for dizziness.     [provider]  multivitamin (RENA-VIT) TABS tablet Take 1 tablet by mouth daily.     [provider]  nortriptyline (PAMELOR) 10 MG capsule Take one capsule at night for one week, then take 2 capsules at night for one week, then take 3 capsules at night Patient not taking: Reported on 05/24/2017 02/24/17   Kathrynn Ducking, MD  omeprazole (PRILOSEC) 20 MG capsule Take 20 mg by mouth 2 (two) times daily before a meal.    [provider]  oxyCODONE (OXY IR/ROXICODONE) 5 MG immediate release tablet Take 1 tablet (5 mg total) by mouth daily as needed for moderate  pain. Patient not taking: Reported on 05/24/2017 11/11/16   Virgina Jock A, PA-C  prochlorperazine (COMPAZINE) 5 MG tablet Take 5 mg by mouth every 6 (six) hours as needed for nausea or vomiting (for dialysis).    [provider]  sitaGLIPtin (JANUVIA) 25 MG tablet Take 25 mg by mouth daily.    [provider]  tiZANidine (ZANAFLEX) 2 MG tablet Take 1 tablet (2 mg total) by mouth 2 (two) times daily. 10/05/16   Kathrynn Ducking, MD  traMADol (ULTRAM) 50 MG tablet Take 1 tablet by mouth. 03/27/17   [provider]  Zinc 50 MG CAPS Take 50 mg  by mouth every morning.     [provider]    Family History Family History  Problem Relation Age of Onset  . Epilepsy Mother   . Diabetes Mother   . Cancer Mother   . Alcohol abuse Father     Social History Social History   Tobacco Use  . Smoking status: Former Smoker    Years: 3.00    Types: Cigarettes  . Smokeless tobacco: Never Used  . Tobacco comment: "stopped smoking in the 1970's"  Substance Use Topics  . Alcohol use: No  . Drug use: Yes    Types: Marijuana, Hydromorphone    Comment: as a teenager     Allergies   Crestor [rosuvastatin calcium]; Sulfa antibiotics; Amoxicillin; Ibuprofen; Gabapentin; Pravastatin; Statins; Chlorphen-phenyleph-asa; Naldecon senior [guaifenesin]; and Rosuvastatin   Review of Systems Review of Systems  Constitutional: Negative for chills and fever.  Eyes: Negative for redness.  Respiratory: Negative for shortness of breath.   Cardiovascular: Negative for chest pain.  Gastrointestinal: Negative for abdominal pain and vomiting.  Genitourinary: Negative for flank pain.  Musculoskeletal: Positive for neck pain.  Skin: Negative for rash.  Neurological: Negative for weakness, numbness and headaches.  Hematological: Does not bruise/bleed easily.  Psychiatric/Behavioral: Negative for confusion.     Physical Exam Updated Vital Signs BP 103/64   Pulse 75    Temp 98.1 F (36.7 C) (Oral)   Resp 18   Ht 1.473 m (4\' 10" )   Wt 81.6 kg (180 lb)   SpO2 98%   BMI 37.62 kg/m   Physical Exam  Constitutional: She appears well-developed and well-nourished. No distress.  HENT:  Mouth/Throat: Oropharynx is clear and moist.  Eyes: Conjunctivae are normal. No scleral icterus.  Neck: Neck supple. No tracheal deviation present. No thyromegaly present.  No bruits. No masses/swelling.   Cardiovascular: Normal rate, regular rhythm, normal heart sounds and intact distal pulses.  Pulmonary/Chest: Effort normal and breath sounds normal. No respiratory distress.  Abdominal: Soft. Normal appearance. She exhibits no distension. There is no tenderness.  Musculoskeletal: She exhibits no edema.  Mid cervical and left neck/left trap m tenderness. Spine aligned, no step off. Good rom left shoulder without pain  Neurological: She is alert.  Motor intact bil ext, stre 5/5. sens grossly intact.   Skin: Skin is warm and dry. No rash noted. She is not diaphoretic.  Psychiatric: She has a normal mood and affect.  Nursing note and vitals reviewed.    ED Treatments / Results  Labs (all labs ordered are listed, but only abnormal results are displayed) Results for orders placed or performed during the hospital encounter of 05/28/17  Comprehensive metabolic panel  Result Value Ref Range   Sodium 137 135 - 145 mmol/L   Potassium 4.4 3.5 - 5.1 mmol/L   Chloride 97 (L) 101 - 111 mmol/L   CO2 26 22 - 32 mmol/L   Glucose, Bld 143 (H) 65 - 99 mg/dL   BUN 37 (H) 6 - 20 mg/dL   Creatinine, Ser 7.34 (H) 0.44 - 1.00 mg/dL   Calcium 8.1 (L) 8.9 - 10.3 mg/dL   Total Protein 6.9 6.5 - 8.1 g/dL   Albumin 3.7 3.5 - 5.0 g/dL   AST 25 15 - 41 U/L   ALT 15 14 - 54 U/L   Alkaline Phosphatase 65 38 - 126 U/L   Total Bilirubin 0.6 0.3 - 1.2 mg/dL   GFR calc non Af Amer 5 (L) >60 mL/min   GFR calc Af  Amer 6 (L) >60 mL/min   Anion gap 14 5 - 15  CBC with Differential  Result Value  Ref Range   WBC 7.1 4.0 - 10.5 K/uL   RBC 3.27 (L) 3.87 - 5.11 MIL/uL   Hemoglobin 11.6 (L) 12.0 - 15.0 g/dL   HCT 35.4 (L) 36.0 - 46.0 %   MCV 108.3 (H) 78.0 - 100.0 fL   MCH 35.5 (H) 26.0 - 34.0 pg   MCHC 32.8 30.0 - 36.0 g/dL   RDW 13.5 11.5 - 15.5 %   Platelets 217 150 - 400 K/uL   Neutrophils Relative % 45 %   Neutro Abs 3.1 1.7 - 7.7 K/uL   Lymphocytes Relative 30 %   Lymphs Abs 2.1 0.7 - 4.0 K/uL   Monocytes Relative 7 %   Monocytes Absolute 0.5 0.1 - 1.0 K/uL   Eosinophils Relative 17 %   Eosinophils Absolute 1.2 (H) 0.0 - 0.7 K/uL   Basophils Relative 1 %   Basophils Absolute 0.1 0.0 - 0.1 K/uL   Xr Shoulder Left  Result Date: 05/24/2017 Left shoulder 3 views: Humeral heads well located.  Glenohumeral joint appears well-preserved.  Y-view shows good subacromial space.  The upper chest lung field appears clear.  No apparent rib fractures are seen.  No other bony abnormalities.   EKG  EKG Interpretation None       Radiology Ct Cervical Spine Wo Contrast  Result Date: 05/28/2017 CLINICAL DATA:  Left-sided neck pain EXAM: CT CERVICAL SPINE WITHOUT CONTRAST TECHNIQUE: Multidetector CT imaging of the cervical spine was performed without intravenous contrast. Multiplanar CT image reconstructions were also generated. COMPARISON:  11/24/2016, 06/13/2016 FINDINGS: Alignment: Trace anterolisthesis of C4 on C5 unchanged. The set alignment within normal limits. Skull base and vertebrae: No acute fracture. No primary bone lesion or focal pathologic process. Soft tissues and spinal canal: No prevertebral fluid or swelling. No visible canal hematoma. Disc levels: Moderate degenerative changes at C4-C5 and C6-C7. Multilevel bilateral facet arthropathy. Multiple levels of foraminal stenosis, most marked on the right side at C3-C4 and C4-C5 and on the left at C2-C3. Upper chest: Negative. Other: None IMPRESSION: 1. Trace anterolisthesis of L4 on L5, unchanged. Moderate degenerative  changes most notable at C4-C5 and C6-C7. 2. No acute osseous abnormality. Electronically Signed   By: Donavan Foil M.D.   On: 05/28/2017 19:59    Procedures Procedures (including critical care time)  Medications Ordered in ED Medications - No data to display   Initial Impression / Assessment and Plan / ED Course  I have reviewed the triage vital signs and the nursing notes.  Pertinent labs & imaging results that were available during my care of the patient were reviewed by me and considered in my medical decision making (see chart for details).  Ultram 1 po.  Ct imaging ordered.   Reviewed nursing notes and prior charts for additional history.   Discussed ct w pt.   Pain improved.  Pt currently appears stable for d/c.     Final Clinical Impressions(s) / ED Diagnoses   Final diagnoses:  None    ED Discharge Orders    None       Lajean Saver, MD 05/28/17 2050

## 2017-05-28 NOTE — ED Notes (Addendum)
Pt states that she has been having pain in L side of neck from occipital portion of head to base of left side of her neck; pain has been present for about 2 weeks. Pt states that she has had this pain before and "its almost like a cramp" she stated that she thought it would go away but no relief. Pt states that pain is 5/10, dull but sharp at times. Pt states that she has Hx of CVA which affected and caused R sided weakness; but she has full ROM on left side.

## 2017-05-28 NOTE — ED Notes (Signed)
Pt discharged from ED; instructions provided and scripts given; Pt encouraged to return to ED if symptoms worsen and to f/u with PCP; Pt verbalized understanding of all instructions 

## 2017-05-28 NOTE — Discharge Instructions (Addendum)
It was our pleasure to provide your ER care today - we hope that you feel better.  Heat/heat therapy to sore area.  You may take hydrocodone as need for pain. No driving for the next 6 hours or when taking hydrocodone. Also, do not take tylenol or acetaminophen containing medication when taking hydrocodone.  Follow up with your doctor in the coming week - discuss possible referral to spine specialist if symptoms fail to improve/resolve.  Return to ER if worse, new symptoms, fevers, numbness/weakness, other concern.

## 2017-05-28 NOTE — ED Notes (Signed)
Patient transported to CT 

## 2017-05-28 NOTE — ED Triage Notes (Signed)
Pt reports left side head pain into the left side of her neck. She reports she is unable to turn her neck without pain. Pt is also a T/TH/Sa dialysis pt. Hx of a stroke in March.

## 2017-06-02 ENCOUNTER — Telehealth: Payer: Self-pay | Admitting: Neurology

## 2017-06-02 MED ORDER — PREDNISONE 5 MG PO TABS
ORAL_TABLET | ORAL | 0 refills | Status: DC
Start: 2017-06-02 — End: 2017-06-16

## 2017-06-02 NOTE — Telephone Encounter (Signed)
Pt is wanting to know what caused her stroke and wants to know more about therapy since her walking isnt getting better

## 2017-06-02 NOTE — Telephone Encounter (Signed)
I called the patient.  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  The patient has tizanidine to take as a muscle relaxant, I will give her a short course of prednisone, the patient does have diabetes.  The patient does have risk factors for cerebrovascular disease with hypertension, dyslipidemia, diabetes.  I will try to get a work and revisit for this patient after the holidays.  She may require some physical therapy.

## 2017-06-02 NOTE — Telephone Encounter (Signed)
Called, spoke to pt. Scheduled work in visit for 06/16/17 at 730am, check in 700am. She verbalized understanding.

## 2017-06-10 ENCOUNTER — Other Ambulatory Visit (INDEPENDENT_AMBULATORY_CARE_PROVIDER_SITE_OTHER): Payer: Self-pay | Admitting: Physician Assistant

## 2017-06-11 DIAGNOSIS — M65321 Trigger finger, right index finger: Secondary | ICD-10-CM | POA: Insufficient documentation

## 2017-06-16 ENCOUNTER — Encounter: Payer: Self-pay | Admitting: Neurology

## 2017-06-16 ENCOUNTER — Ambulatory Visit (INDEPENDENT_AMBULATORY_CARE_PROVIDER_SITE_OTHER): Payer: Medicare Other | Admitting: Neurology

## 2017-06-16 VITALS — BP 106/66 | HR 68 | Ht <= 58 in | Wt 188.9 lb

## 2017-06-16 DIAGNOSIS — G4489 Other headache syndrome: Secondary | ICD-10-CM | POA: Diagnosis not present

## 2017-06-16 DIAGNOSIS — M47812 Spondylosis without myelopathy or radiculopathy, cervical region: Secondary | ICD-10-CM

## 2017-06-16 DIAGNOSIS — R269 Unspecified abnormalities of gait and mobility: Secondary | ICD-10-CM | POA: Diagnosis not present

## 2017-06-16 DIAGNOSIS — G253 Myoclonus: Secondary | ICD-10-CM | POA: Diagnosis not present

## 2017-06-16 HISTORY — DX: Spondylosis without myelopathy or radiculopathy, cervical region: M47.812

## 2017-06-16 MED ORDER — TIZANIDINE HCL 2 MG PO TABS: 2.0000 mg | ORAL_TABLET | Freq: Three times a day (TID) | ORAL | 3 refills | Status: DC

## 2017-06-16 MED ORDER — CLONAZEPAM 0.5 MG PO TABS: 0.5000 mg | ORAL_TABLET | Freq: Every day | ORAL | 2 refills | Status: DC

## 2017-06-16 NOTE — Patient Instructions (Signed)
   We will restart the clonazepam 0.5 mg tablet at night. Increase the tizanidine 2 mg tablet to taking one three times a day.

## 2017-06-16 NOTE — Progress Notes (Signed)
Reason for visit: Neck pain, headache, myoclonus  Roberta Bryant is an 68 y.o. female  History of present illness:  Roberta Bryant is a 67 year old right-handed Hispanic female with a history of end-stage renal disease on hemodialysis.  The patient has been seen here previously for problems with headache and for myoclonus.  She once again comes in with a new complaint of neck pain that is mainly on the left side of the neck currently, but does cross over the right side as well.  The patient will be going in for surgery next week for a right rotator cuff tear repair, she complains of pain in the shoulder and pain going down the right arm.  She was in the emergency room on 28 May 2017 with severe neck discomfort, CT scan of the cervical spine was done at that time showing degenerative changes at the C4-5 and C6-7 levels.  The patient has been given a trial on prednisone which did help while she was on the medication.  The patient continues to have her episodes of myoclonus but they are not daily in nature, occurring 2-3 days out of the week, the patient has noted that the myoclonus is more prominent on the days of her hemodialysis.  The patient does walk short distances inside her home, she has a walker but she does not use the walker.  She does note some gait instability problems.  The patient was placed on clonazepam for her myoclonus but she is not sure that she is actually taking the medication at this time.  The patient is on tizanidine taking 2 mg twice daily.  The patient reports no new numbness or weakness of the extremities.  She does have some dizziness associated with the headache, she does report some crepitus when she turns her head, she denies pain from the neck going down the arms on either side.  She returns for an evaluation.  Past Medical History:  Diagnosis Date  . Anemia   . Aortic stenosis   . CHF (congestive heart failure) ([place])   . Childhood asthma   . Chronic upper  back pain   . Coronary artery disease    Stent to left main coronary artery second of August 2017  . ESRD (end stage renal disease) on dialysis ([place])    "TTS; [facility] in [place]" (10/30/2014)  . Gait abnormality 08/28/2016  . GERD (gastroesophageal reflux disease)   . Headache    "a few times/week" (10/30/2014)  . Headache syndrome 01/08/2017  . History of blood transfusion 1996   "related to menses"  . Hyperlipidemia   . Hypertension   . Hypothyroidism   . IBS (irritable bowel syndrome)   . Myocardial infarction ([place]) 2004  . Osteoarthritis   . Pneumonia    "years ago"  . Stroke ([place]) 07/2016   mini stroke , right side of body weak  . Type II diabetes mellitus ([place])     Past Surgical History:  Procedure Laterality Date  . APPENDECTOMY    . AV FISTULA PLACEMENT Left 12/03/2006   "forearm"  . AV FISTULA REPAIR Left 11/25/2009   "forearm"  . CARPAL TUNNEL RELEASE Right 02/11/2017   Procedure: RIGHT CARPAL TUNNEL RELEASE;  Surgeon: Daryll Brod, MD;  Location: [place] [facility];  Service: Orthopedics;  Laterality: Right;  . CHOLECYSTECTOMY OPEN  1970's  . COLONOSCOPY W/ POLYPECTOMY    . CORONARY ANGIOPLASTY    . CORONARY ANGIOPLASTY WITH STENT PLACEMENT    . HERNIA  REPAIR    . INSERTION OF DIALYSIS CATHETER Right 2010   "chest"  . PARATHYROIDECTOMY  2010?   Parathyroid autotransplantation   . REVISON OF ARTERIOVENOUS FISTULA Left 12/04/2013   Procedure: REVISON OF ARTERIOVENOUS FISTULA;  Surgeon: Rosetta Posner, MD;  Location: [facility];  Service: Vascular;  Laterality: Left;  . REVISON OF ARTERIOVENOUS FISTULA Left 02/15/3715   Procedure: PLICATION OF LEFT BRACHIOCEPHALIC  ARTERIOVENOUS FISTULA;  Surgeon: Conrad Crocker, MD;  Location: [facility];  Service: Vascular;  Laterality: Left;  . TUBAL LIGATION  1989  . UMBILICAL HERNIA REPAIR  1990's X 2    Family History  Problem Relation Age of Onset  . Epilepsy Mother   . Diabetes Mother   . Cancer Mother   . Alcohol abuse  Father     Social history:  reports that she has quit smoking. Her smoking use included cigarettes. She quit after 3.00 years of use. she has never used smokeless tobacco. She reports that she uses drugs. Drugs: Marijuana and Hydromorphone. She reports that she does not drink alcohol.    Allergies  Allergen Reactions  . Crestor [Rosuvastatin Calcium] Other (See Comments)    Dizziness, couldn't walk  . Sulfa Antibiotics Anaphylaxis  . Amoxicillin Hives  . Ibuprofen Hives  . Gabapentin     Dizzy  . Pravastatin Hives and Other (See Comments)    UNSPECIFIED REACTION   . Statins     Most cause Hives  . Chlorphen-Phenyleph-Asa Rash  . [employer]  . Rosuvastatin Other (See Comments)    dizziness    Medications:  Prior to Admission medications   Medication Sig Start Date End Date Taking? Authorizing Provider  acetaminophen (TYLENOL) 325 MG tablet Take 650 mg by mouth every 6 (six) hours as needed for moderate pain or headache.    Yes [provider]  aspirin EC 81 MG tablet Take 81 mg by mouth every morning.   Yes [provider]  carvedilol (COREG) 12.5 MG tablet Take 25 mg by mouth at bedtime.  08/06/16  Yes [provider]  Cholecalciferol (VITAMIN D3) 50000 units CAPS Take 50,000 Units by mouth every 30 (thirty) days.   Yes [provider]  clonazePAM (KLONOPIN) 0.5 MG tablet Take 1 tablet (0.5 mg total) by mouth at bedtime. 03/17/17  Yes Kathrynn Ducking, MD  docusate sodium (COLACE) 100 MG capsule Take 100 mg by mouth 2 (two) times daily.   Yes [provider]  ezetimibe (ZETIA) 10 MG tablet Take 10 mg by mouth daily. 05/10/16  Yes [provider]  Fenofibrate 120 MG TABS Take 1 tablet (120 mg total) by mouth daily. 11/11/16  Yes Virgina Jock A, PA-C  fenofibrate 160 MG tablet Take 160 mg by mouth daily. 03/08/17  Yes [provider]  finasteride (PROSCAR) 5 MG tablet Take 2.5 mg by mouth daily.    Yes [provider]  HYDROcodone-acetaminophen (NORCO) 5-325 MG tablet Take 1 tablet by mouth every 6 (six) hours as needed. 02/11/17  Yes Daryll Brod, MD  HYDROcodone-acetaminophen (NORCO/VICODIN) 5-325 MG tablet Take 1 tablet by mouth every 6 (six) hours as needed for moderate pain. 05/28/17  Yes Lajean Saver, MD  lanthanum (FOSRENOL) 1000 MG chewable tablet Chew 1,000 mg by mouth 3 (three) times daily after meals.   Yes [provider]  levothyroxine (SYNTHROID, LEVOTHROID) 50 MCG tablet Take 50 mcg by mouth daily before breakfast.    Yes [provider]  liraglutide (VICTOZA) 18  MG/3ML SOPN Inject 1.8 mg into the skin daily.    Yes [provider]  loperamide (IMODIUM A-D) 2 MG tablet Take 2 mg by mouth 4 (four) times daily as needed for diarrhea or loose stools.   Yes [provider]  meclizine (ANTIVERT) 25 MG tablet Take 12.5-25 mg by mouth 3 (three) times daily as needed for dizziness.    Yes [provider]  multivitamin (RENA-VIT) TABS tablet Take 1 tablet by mouth daily.    Yes [provider]  omeprazole (PRILOSEC) 20 MG capsule Take 20 mg by mouth 2 (two) times daily before a meal.   Yes [provider]  oxyCODONE (OXY IR/ROXICODONE) 5 MG immediate release tablet Take 1 tablet (5 mg total) by mouth daily as needed for moderate pain. 11/11/16  Yes Alvia Grove, PA-C  predniSONE (DELTASONE) 5 MG tablet Begin taking 6 tablets daily, taper by one tablet daily until off the medication. 06/02/17  Yes Kathrynn Ducking, MD  prochlorperazine (COMPAZINE) 5 MG tablet Take 5 mg by mouth every 6 (six) hours as needed for nausea or vomiting (for dialysis).   Yes [provider]  sitaGLIPtin (JANUVIA) 25 MG tablet Take 25 mg by mouth daily.   Yes [provider]  tiZANidine (ZANAFLEX) 2 MG tablet Take 1 tablet (2 mg total) by mouth 2 (two) times daily. 10/05/16  Yes Kathrynn Ducking, MD  Zinc 50 MG CAPS Take 50  mg by mouth every morning.    Yes [provider]    ROS:  Out of a complete 14 system review of symptoms, the patient complains only of the following symptoms, and all other reviewed systems are negative.  Decreased activity, fatigue, weight gain Ringing in the ears Light sensitivity, blurred vision Chest pain, heart murmur Cold intolerance, excessive thirst, excessive eating Abdominal pain, diarrhea, nausea Insomnia, frequent waking Joint pain, back pain, aching muscles, muscle cramps, walking difficulty, neck pain Memory loss, dizziness, headache, numbness, weakness, tremor Agitation, confusion, depression  Blood pressure 106/66, pulse 68, height 4\' 10"  (1.473 m), weight 188 lb 15 oz (85.7 kg).  Physical Exam  General: The patient is alert and cooperative at the time of the examination.  The patient is moderately obese.  Neuromuscular: The patient lacks about 10-15 degrees of full lateral rotation of the cervical spine bilaterally.  Patient reports a discomfort when she turns her head to the right.  The patient has significant discomfort with passive elevation of the right arm, pain is at the shoulder.  Skin: No significant peripheral edema is noted.   Neurologic Exam  Mental status: The patient is alert and oriented x 3 at the time of the examination. The patient has apparent normal recent and remote memory, with an apparently normal attention span and concentration ability.   Cranial nerves: Facial symmetry is present. Speech is normal, no aphasia or dysarthria is noted. Extraocular movements are full. Visual fields are full.  Motor: The patient has good strength in all 4 extremities.  Sensory examination: Soft touch sensation is symmetric on the face, arms, and legs.  Coordination: The patient has good finger-nose-finger and heel-to-shin bilaterally.  No episodes of myoclonus were observed during the evaluation.  Gait and station: The patient has the ability  to walk a short distance with some assistance, gait is slightly wide-based and unsteady.  Tandem gait was not attempted.  Romberg is unsteady.  Reflexes: Deep tendon reflexes are symmetric, but are depressed.   CT cervical 05/28/17:  IMPRESSION: 1. Trace anterolisthesis of C4 on C5, unchanged. Moderate degenerative changes most notable at C4-C5 and C6-C7. 2. No acute osseous abnormality.   Assessment/Plan:  1.  Cervical spondylosis, neck pain  2.  History of headache  3.  Myoclonus  The patient comes in today with reports of increased neck discomfort, she has had neck problems for many years but the symptoms have worsened recently.  The patient does have cervical spondylosis.  The patient will go up on the tizanidine taking 2 mg 3 times daily, she will restart the clonazepam which may also help as a muscle relaxant taking 0.5 mg at night, but this hopefully will also help the myoclonus.  The patient will likely need physical therapy, but she is having shoulder surgery next week, she will contact our office once she is through this surgery and through the recovery period, and we will get neuromuscular therapy for her cervical spine.  The patient otherwise will follow-up in about 3 months.    Jill Alexanders MD 06/16/2017 7:29 AM  [facility] [address] [place], [place] [address]  Phone [phone] Fax [phone]

## 2017-06-17 NOTE — Pre-Procedure Instructions (Addendum)
RAMONA RUARK  06/17/2017      [facility] [place], [place] [address] [place] [address] Phone: [phone] Fax: [phone]    Your procedure is scheduled on Tuesday 06/22/2017.  Report to [facility] Admitting at [phone] A.M.  Call this number if you have problems the morning of surgery:  [phone]   Remember:  Do not eat food or drink liquids after midnight.   Continue all medications as directed by your physician except follow these medication instructions before surgery below   Take these medicines the morning of surgery with A SIP OF WATER: Acetaminophen (Tylenol) - if needed for pain Finasteride (Proscar) Hydrocodone-acetaminophen (Norco) - if needed for pain Levothyroxine (Synthroid) Meclizine (Antivert) - if needed for dizziness Omeprazole (Prilosec) Tizanidine (Zanaflex) - if needed  7 days prior to surgery STOP taking any Aspirin (unless otherwise instructed by your surgeon), Aleve, Naproxen, Ibuprofen, Motrin, Advil, Goody's, BC's, all herbal medications, fish oil, and all vitamins  WHAT DO I DO ABOUT MY DIABETES MEDICATION?   Marland Kitchen Do not take oral diabetes medicines (pills) the morning of surgery.   . The day of surgery, do not take other diabetes injectables, this includes your Victoza (liraglutide).    How to Manage Your Diabetes Before and After Surgery  Why is it important to control my blood sugar before and after surgery? . Improving blood sugar levels before and after surgery helps healing and can limit problems. . A way of improving blood sugar control is eating a healthy diet by: o  Eating less sugar and carbohydrates o  Increasing activity/exercise o  Talking with your doctor about reaching your blood sugar goals . High blood sugars (greater than 180 mg/dL) can raise your risk of infections and slow your recovery, so you will need to focus on controlling your diabetes during the weeks  before surgery. . Make sure that the doctor who takes care of your diabetes knows about your planned surgery including the date and location.  How do I manage my blood sugar before surgery? . Check your blood sugar at least 4 times a day, starting 2 days before surgery, to make sure that the level is not too high or low. o Check your blood sugar the morning of your surgery when you wake up and every 2 hours until you get to the Short Stay unit. . If your blood sugar is less than 70 mg/dL, you will need to treat for low blood sugar: o Do not take insulin. o Treat a low blood sugar (less than 70 mg/dL) with  cup of clear juice (cranberry or apple), 4 glucose tablets, OR glucose gel. Recheck blood sugar in 15 minutes after treatment (to make sure it is greater than 70 mg/dL). If your blood sugar is not greater than 70 mg/dL on recheck, call [phone] o  for further instructions. . Report your blood sugar to the short stay nurse when you get to Short Stay.  . If you are admitted to the hospital after surgery: o Your blood sugar will be checked by the staff and you will probably be given insulin after surgery (instead of oral diabetes medicines) to make sure you have good blood sugar levels. o The goal for blood sugar control after surgery is 80-180 mg/dL.       Do not wear jewelry, make-up or nail polish.  Do not wear lotions, powders, or perfumes, or deodorant.  Do not  shave 48 hours prior to surgery.    Do not bring valuables to the hospital.  [facility] is not responsible for any belongings or valuables.  Hearing aids, eyeglasses, contacts, dentures or bridgework may not be worn into surgery.  Leave your suitcase in the car.  After surgery it may be brought to your room.  For patients admitted to the hospital, discharge time will be determined by your treatment team.  Patients discharged the day of surgery will not be allowed to drive home.   Name and phone number of your driver:     Special instructions:   [facility]- Preparing For Surgery  Before surgery, you can play an important role. Because skin is not sterile, your skin needs to be as free of germs as possible. You can reduce the number of germs on your skin by washing with CHG (chlorahexidine gluconate) Soap before surgery.  CHG is an antiseptic cleaner which kills germs and bonds with the skin to continue killing germs even after washing.  Please do not use if you have an allergy to CHG or antibacterial soaps. If your skin becomes reddened/irritated stop using the CHG.  Do not shave (including legs and underarms) for at least 48 hours prior to first CHG shower. It is OK to shave your face.  Please follow these instructions carefully.   1. Shower the NIGHT BEFORE SURGERY and the MORNING OF SURGERY with CHG.   2. If you chose to wash your hair, wash your hair first as usual with your normal shampoo.  3. After you shampoo, rinse your hair and body thoroughly to remove the shampoo.  4. Use CHG as you would any other liquid soap. You can apply CHG directly to the skin and wash gently with a scrungie or a clean washcloth.   5. Apply the CHG Soap to your body ONLY FROM THE NECK DOWN.  Do not use on open wounds or open sores. Avoid contact with your eyes, ears, mouth and genitals (private parts). Wash Face and genitals (private parts)  with your normal soap.  6. Wash thoroughly, paying special attention to the area where your surgery will be performed.  7. Thoroughly rinse your body with warm water from the neck down.  8. DO NOT shower/wash with your normal soap after using and rinsing off the CHG Soap.  9. Pat yourself dry with a CLEAN TOWEL.  10. Wear CLEAN PAJAMAS to bed the night before surgery, wear comfortable clothes the morning of surgery  11. Place CLEAN SHEETS on your bed the night of your first shower and DO NOT SLEEP WITH PETS.    Day of Surgery: Shower as stated above. Do not apply any  deodorants/lotions.  Please wear clean clothes to the hospital/surgery center.      Please read over the following fact sheets that you were given. Coughing and Deep Breathing and Surgical Site Infection Prevention

## 2017-06-18 ENCOUNTER — Encounter (HOSPITAL_COMMUNITY): Payer: Self-pay

## 2017-06-18 ENCOUNTER — Other Ambulatory Visit: Payer: Self-pay

## 2017-06-18 ENCOUNTER — Encounter (HOSPITAL_COMMUNITY)
Admission: RE | Admit: 2017-06-18 | Discharge: 2017-06-18 | Disposition: A | Discharge status: Home / Self Care | Payer: Medicare Other | Source: Ambulatory Visit | Attending: Orthopaedic Surgery | Admitting: Orthopaedic Surgery

## 2017-06-18 DIAGNOSIS — Z01818 Encounter for other preprocedural examination: Secondary | ICD-10-CM | POA: Diagnosis present

## 2017-06-18 DIAGNOSIS — E785 Hyperlipidemia, unspecified: Secondary | ICD-10-CM | POA: Insufficient documentation

## 2017-06-18 DIAGNOSIS — Z7982 Long term (current) use of aspirin: Secondary | ICD-10-CM | POA: Insufficient documentation

## 2017-06-18 DIAGNOSIS — N186 End stage renal disease: Secondary | ICD-10-CM | POA: Diagnosis not present

## 2017-06-18 DIAGNOSIS — Z79899 Other long term (current) drug therapy: Secondary | ICD-10-CM | POA: Insufficient documentation

## 2017-06-18 DIAGNOSIS — E1122 Type 2 diabetes mellitus with diabetic chronic kidney disease: Secondary | ICD-10-CM | POA: Insufficient documentation

## 2017-06-18 DIAGNOSIS — I120 Hypertensive chronic kidney disease with stage 5 chronic kidney disease or end stage renal disease: Secondary | ICD-10-CM | POA: Insufficient documentation

## 2017-06-18 DIAGNOSIS — Z87891 Personal history of nicotine dependence: Secondary | ICD-10-CM | POA: Diagnosis not present

## 2017-06-18 DIAGNOSIS — Z992 Dependence on renal dialysis: Secondary | ICD-10-CM | POA: Diagnosis not present

## 2017-06-18 DIAGNOSIS — Z8673 Personal history of transient ischemic attack (TIA), and cerebral infarction without residual deficits: Secondary | ICD-10-CM | POA: Insufficient documentation

## 2017-06-18 DIAGNOSIS — Z7984 Long term (current) use of oral hypoglycemic drugs: Secondary | ICD-10-CM | POA: Diagnosis not present

## 2017-06-18 DIAGNOSIS — Z9889 Other specified postprocedural states: Secondary | ICD-10-CM | POA: Diagnosis not present

## 2017-06-18 LAB — CBC
HEMATOCRIT: 31.1 % — AB (ref 36.0–46.0)
HEMOGLOBIN: 10.3 g/dL — AB (ref 12.0–15.0)
MCH: 36.3 pg — ABNORMAL HIGH (ref 26.0–34.0)
MCHC: 33.1 g/dL (ref 30.0–36.0)
MCV: 109.5 fL — AB (ref 78.0–100.0)
Platelets: 185 10*3/uL (ref 150–400)
RBC: 2.84 MIL/uL — ABNORMAL LOW (ref 3.87–5.11)
RDW: 13.8 % (ref 11.5–15.5)
WBC: 5.2 10*3/uL (ref 4.0–10.5)

## 2017-06-18 LAB — BASIC METABOLIC PANEL
ANION GAP: 12 (ref 5–15)
BUN: 28 mg/dL — ABNORMAL HIGH (ref 6–20)
CALCIUM: 7.7 mg/dL — AB (ref 8.9–10.3)
CO2: 27 mmol/L (ref 22–32)
Chloride: 102 mmol/L (ref 101–111)
Creatinine, Ser: 6.14 mg/dL — ABNORMAL HIGH (ref 0.44–1.00)
GFR calc non Af Amer: 6 mL/min — ABNORMAL LOW (ref >60)
GFR, EST AFRICAN AMERICAN: 7 mL/min — AB (ref >60)
GLUCOSE: 86 mg/dL (ref 65–99)
Potassium: 4.9 mmol/L (ref 3.5–5.1)
Sodium: 141 mmol/L (ref 135–145)

## 2017-06-18 LAB — HEMOGLOBIN A1C
Hgb A1c MFr Bld: 5.2 % (ref 4.8–5.6)
Mean Plasma Glucose: 102.54 mg/dL

## 2017-06-18 LAB — GLUCOSE, CAPILLARY: GLUCOSE-CAPILLARY: 88 mg/dL (ref 65–99)

## 2017-06-18 NOTE — Progress Notes (Signed)
PCP - Dr. Irven Shelling Cardiologist - Dr. Agustin Cree  Chest x-ray - 08/13/2016 EKG - 10/09/2016 Stress Test - 12/2015 ECHO - 05/05/2017 Cardiac Cath - 01/2016  Sleep Study - patient denies  Fasting Blood Sugar - 80's Checks Blood Sugar 0 times a day  Patient has HD TThSat - patient is going on Monday 06/21/2017, day before surgery   Anesthesia review: yes, cardiac history  Patient denies shortness of breath, fever, cough and chest pain at PAT appointment   Patient verbalized understanding of instructions that were given to them at the PAT appointment. Patient was also instructed that they will need to review over the PAT instructions again at home before surgery.

## 2017-06-18 NOTE — Progress Notes (Signed)
   06/18/17 0932  OBSTRUCTIVE SLEEP APNEA  Have you ever been diagnosed with sleep apnea through a sleep study? No  Do you snore loudly (loud enough to be heard through closed doors)?  1  Do you often feel tired, fatigued, or sleepy during the daytime (such as falling asleep during driving or talking to someone)? 0  Has anyone observed you stop breathing during your sleep? 0  Do you have, or are you being treated for high blood pressure? 1  BMI more than 35 kg/m2? 1  Age > 50 (1-yes) 1  Neck circumference greater than:Female 16 inches or larger, Female 17inches or larger? 1  Female Gender (Yes=1) 0  Obstructive Sleep Apnea Score 5  Score 5 or greater  Results sent to PCP

## 2017-06-21 MED ORDER — CLINDAMYCIN PHOSPHATE 900 MG/50ML IV SOLN
900.0000 mg | INTRAVENOUS | Status: AC
  Administered 2017-06-22: 900 mg via INTRAVENOUS
  Filled 2017-06-21: qty 50

## 2017-06-21 NOTE — Progress Notes (Addendum)
Anesthesia Chart Review:   Patient is a 67 year old female scheduled for right shoulder arthroscopy with extensive debridement, possible rotator cuff repair on 06/22/17 by Dr. Jean Rosenthal.   - Cardiologist is [name], MD.  Last office visit 04/12/17 - Neurologist is Margette Fast, MD  PMH includes: CAD (DES to [facility] 01/2016), CHF, moderate aortic stenosis, HTN, DM, hyperlipidemia, anemia, ESRD on hemodialysis, stroke, childhood asthma. Former smoker. BMI 41. S/p R Carpal tunnel release.   Medications include: ASA 81 mg, carvedilol, Zetia, fenofibrate, levothyroxine, liraglutide, Prilosec, sitagliptin, Brilinta.  BP (!) 166/68   Pulse 60   Temp 36.9 C   Resp 18   Ht 4\' 10"  (1.473 m)   Wt 196 lb 12.8 oz (89.3 kg)   SpO2 100%   BMI 41.13 kg/m    Preoperative labs reviewed.   - HbA1c 5.2, glucose 86 - renal function c/w ESRD  CXR 08/13/16:  1. Chronic appearing interstitial changes without definite acute or superimposed abnormality. 2. Borderline cardiomegaly  EKG 10/08/16: Sinus rhythm. Probable left atrial enlargement. LVH with secondary repolarization abnormality. Baseline wander in lead(s) II III aVF  Echo 05/05/17:  - Left ventricle: The cavity size was normal. Wall thickness was increased in a pattern of moderate LVH. Systolic function was normal. The estimated ejection fraction was in the range of 50% to 55%. Wall motion was normal; there were no regional wall motion abnormalities. Features are consistent with a pseudonormal left ventricular filling pattern, with concomitant abnormal relaxation and increased filling pressure (grade 2 diastolic dysfunction). Doppler parameters are consistent with high ventricular filling pressure. - Aortic valve: Valve mobility was moderately restricted. There was moderate stenosis. There was mild regurgitation, with a single jet originating from the central coaptation point. Mean gradient (S): 25 mm Hg. Peak gradient (S): 49 mm  Hg. Valve area (VTI): 0.98 cm^2. Valve area (Vmax): 1.03 cm^2. Valve area (Vmean): 1.05 cm^2. - Mitral valve: Mildly calcified annulus. Normal thickness leaflets. There was mild regurgitation. - Left atrium: The atrium was moderately dilated.  Carotid duplex 09/11/16:  1. Bilateral carotid bifurcation and proximal ICA plaque, resulting in less than 50% diameter stenosis. 2. Antegrade bilateral vertebral arterial flow.  Cardiac cath 01/06/16 (care everywhere): 1. Severe stenosis of the left main coronary artery. S/p DES to LM 01/08/16 (60% stenosis reduced to 10%)  2. Otherwise nonobstructive CAD (LAD, CX, and RCA with <20%)  If no changes, I anticipate pt can proceed with surgery as scheduled.   Willeen Cass, FNP-BC [facility] Short Stay Surgical Center/Anesthesiology Phone: [phone] 06/21/2017 2:21 PM

## 2017-06-22 ENCOUNTER — Other Ambulatory Visit: Payer: Self-pay

## 2017-06-22 ENCOUNTER — Encounter (HOSPITAL_COMMUNITY): Payer: Self-pay | Admitting: *Deleted

## 2017-06-22 ENCOUNTER — Observation Stay (HOSPITAL_COMMUNITY)
Admission: RE | Admit: 2017-06-22 | Discharge: 2017-06-23 | Disposition: A | Discharge status: Home / Self Care | Payer: Medicare Other | Source: Ambulatory Visit | Attending: Orthopaedic Surgery | Admitting: Orthopaedic Surgery

## 2017-06-22 ENCOUNTER — Encounter (HOSPITAL_COMMUNITY)
Admission: RE | Disposition: A | Discharge status: Home / Self Care | Payer: Self-pay | Source: Ambulatory Visit | Attending: Orthopaedic Surgery

## 2017-06-22 ENCOUNTER — Ambulatory Visit (HOSPITAL_COMMUNITY): Payer: Medicare Other | Admitting: Vascular Surgery

## 2017-06-22 ENCOUNTER — Ambulatory Visit (HOSPITAL_COMMUNITY): Payer: Medicare Other | Admitting: Certified Registered Nurse Anesthetist

## 2017-06-22 DIAGNOSIS — I509 Heart failure, unspecified: Secondary | ICD-10-CM | POA: Insufficient documentation

## 2017-06-22 DIAGNOSIS — Z880 Allergy status to penicillin: Secondary | ICD-10-CM | POA: Diagnosis not present

## 2017-06-22 DIAGNOSIS — Z955 Presence of coronary angioplasty implant and graft: Secondary | ICD-10-CM | POA: Insufficient documentation

## 2017-06-22 DIAGNOSIS — Z8673 Personal history of transient ischemic attack (TIA), and cerebral infarction without residual deficits: Secondary | ICD-10-CM | POA: Diagnosis not present

## 2017-06-22 DIAGNOSIS — I132 Hypertensive heart and chronic kidney disease with heart failure and with stage 5 chronic kidney disease, or end stage renal disease: Secondary | ICD-10-CM | POA: Diagnosis not present

## 2017-06-22 DIAGNOSIS — Z7982 Long term (current) use of aspirin: Secondary | ICD-10-CM | POA: Insufficient documentation

## 2017-06-22 DIAGNOSIS — Z992 Dependence on renal dialysis: Secondary | ICD-10-CM | POA: Diagnosis not present

## 2017-06-22 DIAGNOSIS — Z6841 Body Mass Index (BMI) 40.0 and over, adult: Secondary | ICD-10-CM | POA: Diagnosis not present

## 2017-06-22 DIAGNOSIS — M75121 Complete rotator cuff tear or rupture of right shoulder, not specified as traumatic: Secondary | ICD-10-CM | POA: Diagnosis present

## 2017-06-22 DIAGNOSIS — M19011 Primary osteoarthritis, right shoulder: Secondary | ICD-10-CM | POA: Insufficient documentation

## 2017-06-22 DIAGNOSIS — I2510 Atherosclerotic heart disease of native coronary artery without angina pectoris: Secondary | ICD-10-CM | POA: Insufficient documentation

## 2017-06-22 DIAGNOSIS — Z79890 Hormone replacement therapy (postmenopausal): Secondary | ICD-10-CM | POA: Insufficient documentation

## 2017-06-22 DIAGNOSIS — Z794 Long term (current) use of insulin: Secondary | ICD-10-CM | POA: Diagnosis not present

## 2017-06-22 DIAGNOSIS — N186 End stage renal disease: Secondary | ICD-10-CM | POA: Diagnosis not present

## 2017-06-22 DIAGNOSIS — E039 Hypothyroidism, unspecified: Secondary | ICD-10-CM | POA: Insufficient documentation

## 2017-06-22 DIAGNOSIS — Z888 Allergy status to other drugs, medicaments and biological substances status: Secondary | ICD-10-CM | POA: Diagnosis not present

## 2017-06-22 DIAGNOSIS — E1122 Type 2 diabetes mellitus with diabetic chronic kidney disease: Secondary | ICD-10-CM | POA: Diagnosis not present

## 2017-06-22 DIAGNOSIS — Z886 Allergy status to analgesic agent status: Secondary | ICD-10-CM | POA: Diagnosis not present

## 2017-06-22 DIAGNOSIS — E785 Hyperlipidemia, unspecified: Secondary | ICD-10-CM | POA: Diagnosis not present

## 2017-06-22 DIAGNOSIS — Z79899 Other long term (current) drug therapy: Secondary | ICD-10-CM | POA: Diagnosis not present

## 2017-06-22 DIAGNOSIS — Z87891 Personal history of nicotine dependence: Secondary | ICD-10-CM | POA: Diagnosis not present

## 2017-06-22 DIAGNOSIS — E669 Obesity, unspecified: Secondary | ICD-10-CM | POA: Diagnosis not present

## 2017-06-22 DIAGNOSIS — Z9889 Other specified postprocedural states: Secondary | ICD-10-CM | POA: Diagnosis present

## 2017-06-22 HISTORY — PX: SHOULDER ARTHROSCOPY WITH ROTATOR CUFF REPAIR AND SUBACROMIAL DECOMPRESSION: SHX5686

## 2017-06-22 LAB — GLUCOSE, CAPILLARY
GLUCOSE-CAPILLARY: 108 mg/dL — AB (ref 65–99)
GLUCOSE-CAPILLARY: 140 mg/dL — AB (ref 65–99)
Glucose-Capillary: 84 mg/dL (ref 65–99)
Glucose-Capillary: 102 mg/dL — ABNORMAL HIGH (ref 65–99)

## 2017-06-22 LAB — POCT I-STAT 4, (NA,K, GLUC, HGB,HCT)
Glucose, Bld: 92 mg/dL (ref 65–99)
HEMATOCRIT: 35 % — AB (ref 36.0–46.0)
HEMOGLOBIN: 11.9 g/dL — AB (ref 12.0–15.0)
Potassium: 4.2 mmol/L (ref 3.5–5.1)
Sodium: 137 mmol/L (ref 135–145)

## 2017-06-22 SURGERY — SHOULDER ARTHROSCOPY WITH ROTATOR CUFF REPAIR AND SUBACROMIAL DECOMPRESSION
Anesthesia: General | Site: Shoulder | Laterality: Right

## 2017-06-22 MED ORDER — PANTOPRAZOLE SODIUM 40 MG PO TBEC
80.0000 mg | DELAYED_RELEASE_TABLET | Freq: Every day | ORAL | Status: DC
  Administered 2017-06-22 – 2017-06-23 (×2): 80 mg via ORAL
  Filled 2017-06-22 (×2): qty 2

## 2017-06-22 MED ORDER — CLONAZEPAM 0.5 MG PO TABS
0.5000 mg | ORAL_TABLET | Freq: Every day | ORAL | Status: DC
  Administered 2017-06-22: 0.5 mg via ORAL
  Filled 2017-06-22: qty 1

## 2017-06-22 MED ORDER — MECLIZINE HCL 25 MG PO TABS: 50.0000 mg | ORAL_TABLET | Freq: Every day | ORAL | Status: DC | PRN

## 2017-06-22 MED ORDER — FINASTERIDE 5 MG PO TABS
2.5000 mg | ORAL_TABLET | Freq: Every day | ORAL | Status: DC
  Administered 2017-06-23: 2.5 mg via ORAL
  Filled 2017-06-22 (×2): qty 0.5

## 2017-06-22 MED ORDER — SODIUM CHLORIDE 0.9 % IV SOLN: INTRAVENOUS | Status: DC

## 2017-06-22 MED ORDER — CHLORHEXIDINE GLUCONATE 4 % EX LIQD: 60.0000 mL | Freq: Once | CUTANEOUS | Status: DC

## 2017-06-22 MED ORDER — ROCURONIUM BROMIDE 100 MG/10ML IV SOLN
INTRAVENOUS | Status: DC | PRN
  Administered 2017-06-22: 40 mg via INTRAVENOUS
  Administered 2017-06-22: 10 mg via INTRAVENOUS

## 2017-06-22 MED ORDER — ONDANSETRON HCL 4 MG/2ML IJ SOLN: 4.0000 mg | Freq: Four times a day (QID) | INTRAMUSCULAR | Status: DC | PRN

## 2017-06-22 MED ORDER — PROPOFOL 10 MG/ML IV BOLUS
INTRAVENOUS | Status: AC
  Filled 2017-06-22: qty 40

## 2017-06-22 MED ORDER — LACTATED RINGERS IV SOLN: INTRAVENOUS | Status: DC | PRN

## 2017-06-22 MED ORDER — MIDAZOLAM HCL 5 MG/5ML IJ SOLN
INTRAMUSCULAR | Status: DC | PRN
  Administered 2017-06-22: 1 mg via INTRAVENOUS

## 2017-06-22 MED ORDER — PROPOFOL 10 MG/ML IV BOLUS
INTRAVENOUS | Status: DC | PRN
  Administered 2017-06-22: 150 mg via INTRAVENOUS

## 2017-06-22 MED ORDER — LIRAGLUTIDE 18 MG/3ML ~~LOC~~ SOPN: 1.8000 mg | PEN_INJECTOR | Freq: Every day | SUBCUTANEOUS | Status: DC

## 2017-06-22 MED ORDER — LANTHANUM CARBONATE 500 MG PO CHEW
1000.0000 mg | CHEWABLE_TABLET | Freq: Three times a day (TID) | ORAL | Status: DC
  Administered 2017-06-22 – 2017-06-23 (×2): 1000 mg via ORAL
  Filled 2017-06-22 (×2): qty 2

## 2017-06-22 MED ORDER — ONDANSETRON HCL 4 MG PO TABS: 4.0000 mg | ORAL_TABLET | Freq: Four times a day (QID) | ORAL | Status: DC | PRN

## 2017-06-22 MED ORDER — LINAGLIPTIN 5 MG PO TABS
5.0000 mg | ORAL_TABLET | Freq: Every day | ORAL | Status: DC
  Administered 2017-06-22: 5 mg via ORAL
  Filled 2017-06-22 (×2): qty 1

## 2017-06-22 MED ORDER — EZETIMIBE 10 MG PO TABS
10.0000 mg | ORAL_TABLET | Freq: Every day | ORAL | Status: DC
  Administered 2017-06-22 – 2017-06-23 (×2): 10 mg via ORAL
  Filled 2017-06-22 (×2): qty 1

## 2017-06-22 MED ORDER — METOCLOPRAMIDE HCL 5 MG PO TABS: 5.0000 mg | ORAL_TABLET | Freq: Three times a day (TID) | ORAL | Status: DC | PRN

## 2017-06-22 MED ORDER — ENSURE ENLIVE PO LIQD: 237.0000 mL | Freq: Two times a day (BID) | ORAL | Status: DC

## 2017-06-22 MED ORDER — PHENYLEPHRINE HCL 10 MG/ML IJ SOLN
INTRAMUSCULAR | Status: DC | PRN
  Administered 2017-06-22: 120 ug via INTRAVENOUS

## 2017-06-22 MED ORDER — LEVOTHYROXINE SODIUM 50 MCG PO TABS
50.0000 ug | ORAL_TABLET | Freq: Every day | ORAL | Status: DC
  Administered 2017-06-23: 50 ug via ORAL
  Filled 2017-06-22: qty 1

## 2017-06-22 MED ORDER — HYDROMORPHONE HCL 1 MG/ML IJ SOLN
INTRAMUSCULAR | Status: AC
  Filled 2017-06-22: qty 1

## 2017-06-22 MED ORDER — MIDAZOLAM HCL 2 MG/2ML IJ SOLN
INTRAMUSCULAR | Status: AC
  Filled 2017-06-22: qty 2

## 2017-06-22 MED ORDER — CARVEDILOL 25 MG PO TABS
25.0000 mg | ORAL_TABLET | Freq: Every day | ORAL | Status: DC
  Administered 2017-06-22: 25 mg via ORAL
  Filled 2017-06-22: qty 1

## 2017-06-22 MED ORDER — SODIUM CHLORIDE 0.9 % IR SOLN
Status: DC | PRN
  Administered 2017-06-22: 6000 mL

## 2017-06-22 MED ORDER — PHENYLEPHRINE HCL 10 MG/ML IJ SOLN
INTRAMUSCULAR | Status: DC | PRN
  Administered 2017-06-22: 20 ug/min via INTRAVENOUS
  Administered 2017-06-22: 60 ug/min via INTRAVENOUS
  Administered 2017-06-22: 40 ug/min via INTRAVENOUS

## 2017-06-22 MED ORDER — PROMETHAZINE HCL 25 MG/ML IJ SOLN: 6.2500 mg | INTRAMUSCULAR | Status: DC | PRN

## 2017-06-22 MED ORDER — FENTANYL CITRATE (PF) 100 MCG/2ML IJ SOLN
INTRAMUSCULAR | Status: AC
  Filled 2017-06-22: qty 2

## 2017-06-22 MED ORDER — LIDOCAINE HCL (CARDIAC) 20 MG/ML IV SOLN
INTRAVENOUS | Status: DC | PRN
Start: 2017-06-22 — End: 2017-06-22
  Administered 2017-06-22: 50 mg via INTRAVENOUS

## 2017-06-22 MED ORDER — FENOFIBRATE 160 MG PO TABS
160.0000 mg | ORAL_TABLET | Freq: Every day | ORAL | Status: DC
  Administered 2017-06-22 – 2017-06-23 (×2): 160 mg via ORAL
  Filled 2017-06-22 (×2): qty 1

## 2017-06-22 MED ORDER — METHOCARBAMOL 500 MG PO TABS
ORAL_TABLET | ORAL | Status: AC
  Filled 2017-06-22: qty 1

## 2017-06-22 MED ORDER — HYDROCODONE-ACETAMINOPHEN 5-325 MG PO TABS
1.0000 | ORAL_TABLET | ORAL | Status: DC | PRN
  Administered 2017-06-23: 1 via ORAL
  Administered 2017-06-23: 2 via ORAL
  Filled 2017-06-22: qty 1
  Filled 2017-06-22: qty 2

## 2017-06-22 MED ORDER — ONDANSETRON HCL 4 MG/2ML IJ SOLN
INTRAMUSCULAR | Status: DC | PRN
  Administered 2017-06-22: 4 mg via INTRAVENOUS

## 2017-06-22 MED ORDER — METOCLOPRAMIDE HCL 5 MG/ML IJ SOLN: 5.0000 mg | Freq: Three times a day (TID) | INTRAMUSCULAR | Status: DC | PRN

## 2017-06-22 MED ORDER — HYDROMORPHONE HCL 1 MG/ML IJ SOLN: 1.0000 mg | INTRAMUSCULAR | Status: DC | PRN

## 2017-06-22 MED ORDER — ASPIRIN EC 81 MG PO TBEC
81.0000 mg | DELAYED_RELEASE_TABLET | Freq: Every morning | ORAL | Status: DC
  Administered 2017-06-23: 81 mg via ORAL
  Filled 2017-06-22: qty 1

## 2017-06-22 MED ORDER — METHOCARBAMOL 500 MG PO TABS
500.0000 mg | ORAL_TABLET | Freq: Four times a day (QID) | ORAL | Status: DC | PRN
Start: 2017-06-22 — End: 2017-06-23
  Administered 2017-06-22 – 2017-06-23 (×3): 500 mg via ORAL
  Filled 2017-06-22 (×2): qty 1

## 2017-06-22 MED ORDER — DIPHENHYDRAMINE HCL 12.5 MG/5ML PO ELIX: 12.5000 mg | ORAL_SOLUTION | ORAL | Status: DC | PRN

## 2017-06-22 MED ORDER — 0.9 % SODIUM CHLORIDE (POUR BTL) OPTIME
TOPICAL | Status: DC | PRN
  Administered 2017-06-22: 1000 mL

## 2017-06-22 MED ORDER — HYDROMORPHONE HCL 1 MG/ML IJ SOLN: 0.2500 mg | INTRAMUSCULAR | Status: DC | PRN

## 2017-06-22 MED ORDER — METHOCARBAMOL 1000 MG/10ML IJ SOLN
500.0000 mg | Freq: Four times a day (QID) | INTRAVENOUS | Status: DC | PRN
  Filled 2017-06-22: qty 5

## 2017-06-22 MED ORDER — ACETAMINOPHEN 325 MG PO TABS: 650.0000 mg | ORAL_TABLET | ORAL | Status: DC | PRN

## 2017-06-22 MED ORDER — ZINC SULFATE 220 (50 ZN) MG PO CAPS
220.0000 mg | ORAL_CAPSULE | Freq: Every day | ORAL | Status: DC
  Administered 2017-06-23: 220 mg via ORAL
  Filled 2017-06-22: qty 1

## 2017-06-22 MED ORDER — NEOSTIGMINE METHYLSULFATE 5 MG/5ML IV SOSY
PREFILLED_SYRINGE | INTRAVENOUS | Status: AC
  Filled 2017-06-22: qty 5

## 2017-06-22 MED ORDER — FENTANYL CITRATE (PF) 250 MCG/5ML IJ SOLN
INTRAMUSCULAR | Status: DC | PRN
  Administered 2017-06-22: 50 ug via INTRAVENOUS
  Administered 2017-06-22: 100 ug via INTRAVENOUS

## 2017-06-22 MED ORDER — FENTANYL CITRATE (PF) 250 MCG/5ML IJ SOLN
INTRAMUSCULAR | Status: AC
  Filled 2017-06-22: qty 5

## 2017-06-22 MED ORDER — DOCUSATE SODIUM 100 MG PO CAPS
100.0000 mg | ORAL_CAPSULE | Freq: Two times a day (BID) | ORAL | Status: DC
  Administered 2017-06-22 – 2017-06-23 (×2): 100 mg via ORAL
  Filled 2017-06-22 (×2): qty 1

## 2017-06-22 MED ORDER — KETAMINE HCL 10 MG/ML IJ SOLN
INTRAMUSCULAR | Status: DC | PRN
  Administered 2017-06-22: 20 mg via INTRAVENOUS

## 2017-06-22 MED ORDER — OXYCODONE HCL 5 MG PO TABS
ORAL_TABLET | ORAL | Status: AC
  Filled 2017-06-22: qty 2

## 2017-06-22 MED ORDER — RENA-VITE PO TABS
1.0000 | ORAL_TABLET | Freq: Every day | ORAL | Status: DC
  Administered 2017-06-22: 1 via ORAL
  Filled 2017-06-22: qty 1

## 2017-06-22 MED ORDER — OXYCODONE HCL 5 MG PO TABS
10.0000 mg | ORAL_TABLET | ORAL | Status: DC | PRN
  Administered 2017-06-22 – 2017-06-23 (×3): 10 mg via ORAL
  Filled 2017-06-22 (×3): qty 2

## 2017-06-22 MED ORDER — ACETAMINOPHEN 650 MG RE SUPP: 650.0000 mg | RECTAL | Status: DC | PRN

## 2017-06-22 MED ORDER — SODIUM CHLORIDE 0.9 % IV SOLN
INTRAVENOUS | Status: DC
  Administered 2017-06-22: 11:00:00 via INTRAVENOUS

## 2017-06-22 MED ORDER — DEXAMETHASONE SODIUM PHOSPHATE 4 MG/ML IJ SOLN
INTRAMUSCULAR | Status: DC | PRN
Start: 2017-06-22 — End: 2017-06-22
  Administered 2017-06-22: 4 mg via INTRAVENOUS

## 2017-06-22 MED ORDER — ZINC 50 MG PO CAPS: 50.0000 mg | ORAL_CAPSULE | Freq: Every morning | ORAL | Status: DC

## 2017-06-22 MED ORDER — KETAMINE HCL-SODIUM CHLORIDE 100-0.9 MG/10ML-% IV SOSY
PREFILLED_SYRINGE | INTRAVENOUS | Status: AC
  Filled 2017-06-22: qty 10

## 2017-06-22 SURGICAL SUPPLY — 54 items
BLADE CLIPPER SURG (BLADE) IMPLANT
BLADE CUDA 4.2 (BLADE) ×3 IMPLANT
BLADE SURG 11 STRL SS (BLADE) ×3 IMPLANT
BUR VERTEX HOODED 4.5 (BURR) IMPLANT
CANNULA 5.75X7 CRYSTAL CLEAR (CANNULA) IMPLANT
CANNULA SHOULDER 7CM (CANNULA) ×6 IMPLANT
CANNULA TWIST IN 8.25X7CM (CANNULA) IMPLANT
CLOSURE WOUND 1/2 X4 (GAUZE/BANDAGES/DRESSINGS)
COVER SURGICAL LIGHT HANDLE (MISCELLANEOUS) ×3 IMPLANT
DECANTER SPIKE VIAL GLASS SM (MISCELLANEOUS) IMPLANT
DRAPE INCISE IOBAN 66X45 STRL (DRAPES) IMPLANT
DRAPE SHOULDER BEACH CHAIR (DRAPES) ×3 IMPLANT
DRAPE SURG 17X23 STRL (DRAPES) ×3 IMPLANT
DRAPE U-SHAPE 47X51 STRL (DRAPES) ×3 IMPLANT
DRSG PAD ABDOMINAL 8X10 ST (GAUZE/BANDAGES/DRESSINGS) ×6 IMPLANT
DURAPREP 26ML APPLICATOR (WOUND CARE) ×3 IMPLANT
ELECT REM PT RETURN 9FT ADLT (ELECTROSURGICAL)
ELECTRODE REM PT RTRN 9FT ADLT (ELECTROSURGICAL) IMPLANT
GAUZE SPONGE 4X4 12PLY STRL (GAUZE/BANDAGES/DRESSINGS) ×3 IMPLANT
GAUZE SPONGE 4X4 12PLY STRL LF (GAUZE/BANDAGES/DRESSINGS) ×3 IMPLANT
GAUZE XEROFORM 1X8 LF (GAUZE/BANDAGES/DRESSINGS) ×3 IMPLANT
GLOVE BIO SURGEON STRL SZ8 (GLOVE) ×3 IMPLANT
GLOVE BIOGEL PI IND STRL 8 (GLOVE) ×1 IMPLANT
GLOVE BIOGEL PI INDICATOR 8 (GLOVE) ×2
GLOVE ORTHO TXT STRL SZ7.5 (GLOVE) ×3 IMPLANT
GOWN STRL REUS W/ TWL LRG LVL3 (GOWN DISPOSABLE) ×2 IMPLANT
GOWN STRL REUS W/ TWL XL LVL3 (GOWN DISPOSABLE) ×4 IMPLANT
GOWN STRL REUS W/TWL LRG LVL3 (GOWN DISPOSABLE) ×4
GOWN STRL REUS W/TWL XL LVL3 (GOWN DISPOSABLE) ×8
KIT BASIN OR (CUSTOM PROCEDURE TRAY) ×3 IMPLANT
KIT ROOM TURNOVER OR (KITS) ×3 IMPLANT
KIT SHOULDER TRACTION (DRAPES) ×3 IMPLANT
MANIFOLD NEPTUNE II (INSTRUMENTS) ×3 IMPLANT
NEEDLE 1/2 CIR CATGUT .05X1.09 (NEEDLE) IMPLANT
NEEDLE HYPO 25GX1X1/2 BEV (NEEDLE) IMPLANT
NEEDLE SCORPION (NEEDLE) IMPLANT
NEEDLE SPNL 18GX3.5 QUINCKE PK (NEEDLE) ×3 IMPLANT
NS IRRIG 1000ML POUR BTL (IV SOLUTION) ×3 IMPLANT
PACK SHOULDER (CUSTOM PROCEDURE TRAY) ×3 IMPLANT
PAD ABD 8X10 STRL (GAUZE/BANDAGES/DRESSINGS) ×3 IMPLANT
PAD ARMBOARD 7.5X6 YLW CONV (MISCELLANEOUS) ×6 IMPLANT
SET ARTHROSCOPY TUBING (MISCELLANEOUS) ×2
SET ARTHROSCOPY TUBING LN (MISCELLANEOUS) ×1 IMPLANT
SPONGE LAP 4X18 X RAY DECT (DISPOSABLE) ×3 IMPLANT
STAPLER VISISTAT 35W (STAPLE) IMPLANT
STRIP CLOSURE SKIN 1/2X4 (GAUZE/BANDAGES/DRESSINGS) IMPLANT
SUT ETHILON 3 0 PS 1 (SUTURE) IMPLANT
SYR CONTROL 10ML LL (SYRINGE) IMPLANT
TAPE CLOTH SURG 6X10 WHT LF (GAUZE/BANDAGES/DRESSINGS) ×3 IMPLANT
TOWEL OR 17X24 6PK STRL BLUE (TOWEL DISPOSABLE) ×3 IMPLANT
TOWEL OR 17X26 10 PK STRL BLUE (TOWEL DISPOSABLE) ×3 IMPLANT
WAND HAND CNTRL MULTIVAC 90 (MISCELLANEOUS) ×3 IMPLANT
WAND STAR VAC 90 (SURGICAL WAND) ×3 IMPLANT
WATER STERILE IRR 1000ML POUR (IV SOLUTION) ×3 IMPLANT

## 2017-06-22 NOTE — Transfer of Care (Signed)
Immediate Anesthesia Transfer of Care Note  Patient: Roberta Bryant  Procedure(s) Performed: RIGHT SHOULDER ARTHROSCOPY WITH EXTENSIVE DEBRIDEMENT (Right Shoulder)  Patient Location: PACU  Anesthesia Type:General  Level of Consciousness: awake, alert , oriented, patient cooperative and responds to stimulation  Airway & Oxygen Therapy: Patient Spontanous Breathing and Patient connected to face mask oxygen  Post-op Assessment: Report given to RN, Post -op Vital signs reviewed and stable and Patient moving all extremities X 4  Post vital signs: Reviewed and stable  Last Vitals:  Vitals:   06/22/17 1015 06/22/17 1400  BP: (!) 184/62 (!) (P) 190/78  Pulse: 62   Resp: 19   Temp: 36.9 C (!) (P) 36.2 C  SpO2: 100%     Last Pain:  Vitals:   06/22/17 1018  TempSrc:   PainSc: 4       Patients Stated Pain Goal: 2 (82/50/53 9767)  Complications: No apparent anesthesia complications

## 2017-06-22 NOTE — Progress Notes (Signed)
1520 Received from PACU, A&O x4, right shoulder dressing dry and intact. RUE in a sling, with moderate strength right hand grip. Old numbness noted to right hand, per pt it is due to carpal tunnel syndrome.

## 2017-06-22 NOTE — Progress Notes (Signed)
Pt has pre-existong AVF LUE, positive bruit & thrill.

## 2017-06-22 NOTE — Anesthesia Postprocedure Evaluation (Signed)
Anesthesia Post Note  Patient: Roberta Bryant  Procedure(s) Performed: RIGHT SHOULDER ARTHROSCOPY WITH EXTENSIVE DEBRIDEMENT (Right Shoulder)     Patient location during evaluation: PACU Anesthesia Type: General Level of consciousness: awake and sedated Pain management: pain level controlled Vital Signs Assessment: post-procedure vital signs reviewed and stable Respiratory status: spontaneous breathing, nonlabored ventilation, respiratory function stable and patient connected to nasal cannula oxygen Cardiovascular status: blood pressure returned to baseline and stable Postop Assessment: no apparent nausea or vomiting Anesthetic complications: no    Last Vitals:  Vitals:   06/22/17 1500 06/22/17 1549  BP: (!) 165/60 (!) 164/64  Pulse: (!) 58 (!) 54  Resp: 15 16  Temp: (!) 36.2 C (!) 36.4 C  SpO2: 100% 100%    Last Pain:  Vitals:   06/22/17 1549  TempSrc: Oral  PainSc:                  Brittnye Josephs,JAMES TERRILL

## 2017-06-22 NOTE — H&P (Signed)
Roberta Bryant is an 67 y.o. female.   Chief Complaint: Right shoulder pain and weakness. HPI: The patient is a 67 year old female with multiple medical problems including history of a stroke.  She has had debilitating right shoulder pain for some time with residual right upper extremity weakness as it relates to her stroke.  We have tried conservative treatment with injections and therapy.  Nothing is helped.  An MRI did show a near full-thickness rotator cuff tear.  At this point or in the family wishes to proceed with surgery to at least hopefully decrease her pain and improve the shoulder function which she understands it is going to be significantly difficult given the residual effects of her stroke.  Past Medical History:  Diagnosis Date  . Anemia   . Aortic stenosis   . CHF (congestive heart failure) ([place])   . Childhood asthma   . Chronic upper back pain   . Coronary artery disease    Stent to left main coronary artery second of August 2017  . ESRD (end stage renal disease) on dialysis ([place])    "TTS; [facility] in [place]" (10/30/2014)  . Gait abnormality 08/28/2016  . GERD (gastroesophageal reflux disease)   . Headache    "a few times/week" (10/30/2014)  . Headache syndrome 01/08/2017  . History of blood transfusion 1996   "related to menses"  . Hyperlipidemia   . Hypertension   . Hypothyroidism   . IBS (irritable bowel syndrome)   . Myocardial infarction ([place]) 2004  . Osteoarthritis   . Pneumonia    "years ago"  . Spondylosis of cervical spine 06/16/2017  . Stroke ([place]) 07/2016   mini stroke , right side of body weak  . Type II diabetes mellitus ([place])     Past Surgical History:  Procedure Laterality Date  . APPENDECTOMY    . AV FISTULA PLACEMENT Left 12/03/2006   "forearm"  . AV FISTULA REPAIR Left 11/25/2009   "forearm"  . CARPAL TUNNEL RELEASE Right 02/11/2017   Procedure: RIGHT CARPAL TUNNEL RELEASE;  Surgeon: Daryll Brod, MD;  Location: [facility];   Service: Orthopedics;  Laterality: Right;  . CHOLECYSTECTOMY OPEN  1970's  . COLONOSCOPY W/ POLYPECTOMY    . CORONARY ANGIOPLASTY    . CORONARY ANGIOPLASTY WITH STENT PLACEMENT    . HERNIA REPAIR    . INSERTION OF DIALYSIS CATHETER Right 2010   "chest"  . PARATHYROIDECTOMY  2010?   Parathyroid autotransplantation   . REVISON OF ARTERIOVENOUS FISTULA Left 12/04/2013   Procedure: REVISON OF ARTERIOVENOUS FISTULA;  Surgeon: Rosetta Posner, MD;  Location: [facility];  Service: Vascular;  Laterality: Left;  . REVISON OF ARTERIOVENOUS FISTULA Left 10/09/6642   Procedure: PLICATION OF LEFT BRACHIOCEPHALIC  ARTERIOVENOUS FISTULA;  Surgeon: Conrad Hopewell, MD;  Location: [facility];  Service: Vascular;  Laterality: Left;  . TUBAL LIGATION  1989  . UMBILICAL HERNIA REPAIR  1990's X 2    Family History  Problem Relation Age of Onset  . Epilepsy Mother   . Diabetes Mother   . Cancer Mother   . Alcohol abuse Father    Social History:  reports that she has quit smoking. Her smoking use included cigarettes. She quit after 3.00 years of use. she has never used smokeless tobacco. She reports that she uses drugs. Drugs: Marijuana and Hydromorphone. She reports that she does not drink alcohol.  Allergies:  Allergies  Allergen Reactions  . Sulfa Antibiotics Anaphylaxis  . Amoxicillin Hives  .  Crestor [Rosuvastatin Calcium] Other (See Comments)    Dizziness, couldn't walk  . Ibuprofen Hives  . [place] Senior [employer]  . Pravastatin Hives and Other (See Comments)    UNSPECIFIED REACTION   . Statins Hives  . Chlorphen-Phenyleph-Asa Rash  . Gabapentin Other (See Comments)    Dizzy  . Rosuvastatin Other (See Comments)    dizziness    Medications Prior to Admission  Medication Sig Dispense Refill  . acetaminophen (TYLENOL) 325 MG tablet Take 650 mg by mouth every 6 (six) hours as needed for moderate pain or headache.     Marland Kitchen aspirin EC 81 MG tablet Take 81 mg by mouth every morning.    . carvedilol  (COREG) 12.5 MG tablet Take 25 mg by mouth at bedtime.   2  . Cholecalciferol (VITAMIN D3) 50000 units CAPS Take 50,000 Units by mouth every 30 (thirty) days.    . clonazePAM (KLONOPIN) 0.5 MG tablet Take 1 tablet (0.5 mg total) by mouth at bedtime. 30 tablet 2  . docusate sodium (COLACE) 100 MG capsule Take 100 mg by mouth 2 (two) times daily.    Marland Kitchen ezetimibe (ZETIA) 10 MG tablet Take 10 mg by mouth daily.  1  . fenofibrate 160 MG tablet Take 160 mg by mouth daily.  1  . finasteride (PROSCAR) 5 MG tablet Take 2.5 mg by mouth daily.    Marland Kitchen HYDROcodone-acetaminophen (NORCO) 5-325 MG tablet Take 1 tablet by mouth every 6 (six) hours as needed. 20 tablet 0  . lanthanum (FOSRENOL) 1000 MG chewable tablet Chew 1,000 mg by mouth 3 (three) times daily after meals.    Marland Kitchen levothyroxine (SYNTHROID, LEVOTHROID) 50 MCG tablet Take 50 mcg by mouth daily before breakfast.     . liraglutide (VICTOZA) 18 MG/3ML SOPN Inject 1.8 mg into the skin daily.     Marland Kitchen loperamide (IMODIUM A-D) 2 MG tablet Take 2 mg by mouth 4 (four) times daily as needed for diarrhea or loose stools.    . multivitamin (RENA-VIT) TABS tablet Take 1 tablet by mouth daily.     Marland Kitchen omeprazole (PRILOSEC) 20 MG capsule Take 20 mg by mouth 2 (two) times daily before a meal.    . sitaGLIPtin (JANUVIA) 25 MG tablet Take 25 mg by mouth daily.    . Zinc 50 MG CAPS Take 50 mg by mouth every morning.     . Fenofibrate 120 MG TABS Take 1 tablet (120 mg total) by mouth daily. (Patient not taking: Reported on 06/18/2017) 30 each   . meclizine (ANTIVERT) 25 MG tablet Take 50 mg by mouth daily as needed for dizziness.     Marland Kitchen oxyCODONE (OXY IR/ROXICODONE) 5 MG immediate release tablet Take 1 tablet (5 mg total) by mouth daily as needed for moderate pain. (Patient not taking: Reported on 06/18/2017) 10 tablet 0  . tiZANidine (ZANAFLEX) 2 MG tablet Take 1 tablet (2 mg total) by mouth 3 (three) times daily. (Patient taking differently: Take 2 mg by mouth 2 (two) times  daily as needed for muscle spasms. ) 90 tablet 3    Results for orders placed or performed during the hospital encounter of 06/22/17 (from the past 48 hour(s))  Glucose, capillary     Status: Abnormal   Collection Time: 06/22/17 10:12 AM  Result Value Ref Range   Glucose-Capillary 108 (H) 65 - 99 mg/dL  I-STAT 4, (NA,K, GLUC, HGB,HCT)     Status: Abnormal   Collection Time: 06/22/17 10:29 AM  Result Value Ref  Range   Sodium 137 135 - 145 mmol/L   Potassium 4.2 3.5 - 5.1 mmol/L   Glucose, Bld 92 65 - 99 mg/dL   HCT 35.0 (L) 36.0 - 46.0 %   Hemoglobin 11.9 (L) 12.0 - 15.0 g/dL   No results found.  ROS  Blood pressure (!) 184/62, pulse 62, temperature 98.5 F (36.9 C), temperature source Oral, resp. rate 19, height 4\' 10"  (1.473 m), weight 196 lb (88.9 kg), SpO2 100 %. Physical Exam  Constitutional: She is oriented to person, place, and time. She appears well-developed and well-nourished.  HENT:  Head: Normocephalic and atraumatic.  Eyes: Pupils are equal, round, and reactive to light.  Neck: Normal range of motion.  Cardiovascular: Normal rate.  Respiratory: Effort normal.  GI: Soft.  Musculoskeletal:       Right shoulder: She exhibits decreased range of motion, bony tenderness, pain and decreased strength.  Neurological: She is alert and oriented to person, place, and time.  Skin: Skin is warm and dry.  Psychiatric: She has a normal mood and affect.     Assessment/Plan Right shoulder with near complete rotator cuff tear with profound weakness with the residual effects of a previous stroke.  Per the patient's wishes we will proceed to the operating room today with a right shoulder arthroscopy with extensive debridement and the possibility of rotator cuff repair.  She understands fully this and this may not be possible and this depends on the extent of her rotator cuff tear as well as how we find the collagen fibers.  Also her upper extremities profoundly weak chronically from  the residual effects of her stroke.  All she is hoping to be able to do is to lift that right arm up to clean and wash her hair.  She understands that I cannot state if this will happen.  She will still need extensive therapy after the surgery.  We do plan to admit her overnight for observation given her medical history. Mcarthur Rossetti, MD 06/22/2017, 12:09 PM

## 2017-06-22 NOTE — Evaluation (Signed)
Physical Therapy Evaluation Patient Details Name: Roberta Bryant MRN: [id number] DOB: 09-Jul-1950 Today's Date: 06/22/2017   History of Present Illness  The patient is a 67 year old female with multiple medical problems including history of a stroke.  She has had debilitating right shoulder pain for some time with residual right upper extremity weakness as it relates to her stroke.  We have tried conservative treatment with injections and therapy.  Nothing is helped.  An MRI did show a near full-thickness rotator cuff tear.  At this point or in the family wishes to proceed with surgery to at least hopefully decrease her pain and improve the shoulder function which she understands it is going to be significantly difficult given the residual effects of her stroke.  Clinical Impression  Patient is s/p above surgery resulting in functional limitations due to the deficits listed below (see PT Problem List). Pt mobility limited by current surgery as well as residual weakness R side. Mobility is going to be difficult for her and she is home alone when sons are at work. Recommend HHaide as well as HHPT/OT at d/c. Pt was able to sit EOB with min A and scoot along EOB.  Patient will benefit from skilled PT to increase their independence and safety with mobility to allow discharge to the venue listed below.       Follow Up Recommendations Home health PT;Other (comment)(HHaide)    Equipment Recommendations  3in1 (PT)    Recommendations for Other Services OT consult     Precautions / Restrictions Precautions Precautions: Fall Required Braces or Orthoses: Sling Restrictions Weight Bearing Restrictions: Yes      Mobility  Bed Mobility Overal bed mobility: Needs Assistance Bed Mobility: Supine to Sit;Sit to Supine     Supine to sit: Min assist;HOB elevated Sit to supine: Min assist   General bed mobility comments: min A for LE's to EOB, pt able to manage trunk to sit away from back of bed.  Min A to LE's for return to supine  Transfers Overall transfer level: Needs assistance Equipment used: None Transfers: Lateral/Scoot Transfers          Lateral/Scoot Transfers: Min assist General transfer comment: pt able to scoot along EOB to R using B LE's and pushing with LUE. Was able to achieve partial stand during this process.   Ambulation/Gait             General Gait Details: not attempted today  Stairs            Wheelchair Mobility    Modified Rankin (Stroke Patients Only)       Balance Overall balance assessment: Needs assistance Sitting-balance support: Single extremity supported Sitting balance-Leahy Scale: Good                                       Pertinent Vitals/Pain Pain Assessment: Faces Faces Pain Scale: Hurts worst Pain Location: R shoulder Pain Descriptors / Indicators: Aching;Constant Pain Intervention(s): Limited activity within patient's tolerance;Monitored during session;Ice applied    Home Living Family/patient expects to be discharged to:: Private residence Living Arrangements: Children Available Help at Discharge: Family;Available PRN/intermittently Type of Home: House Home Access: Ramped entrance     Home Layout: One level Home Equipment: Walker - standard;Wheelchair - manual Additional Comments: pt's 3 sons live with her but they all work and she is home alone during that time    Prior Function  Level of Independence: Independent with assistive device(s)         Comments: pt ambulates some without AD and some with RW     Hand Dominance   Dominant Hand: Right    Extremity/Trunk Assessment   Upper Extremity Assessment Upper Extremity Assessment: Defer to OT evaluation;LUE deficits/detail LUE Deficits / Details: pt reports CTS B hands and thus, decreased grip strength L hand    Lower Extremity Assessment Lower Extremity Assessment: RLE deficits/detail RLE Deficits / Details: hip flex 3/5,  knee ext 3/5 RLE Coordination: decreased fine motor;decreased gross motor    Cervical / Trunk Assessment Cervical / Trunk Assessment: Normal  Communication   Communication: No difficulties  Cognition Arousal/Alertness: Awake/alert Behavior During Therapy: WFL for tasks assessed/performed Overall Cognitive Status: Within Functional Limits for tasks assessed                                        General Comments General comments (skin integrity, edema, etc.): discussed use of sling and propping or RUE in sitting    Exercises General Exercises - Lower Extremity Ankle Circles/Pumps: AROM;Both;10 reps;Seated Long Arc Quad: AROM;Both;10 reps;Seated   Assessment/Plan    PT Assessment Patient needs continued PT services  PT Problem List Decreased strength;Decreased range of motion;Decreased activity tolerance;Decreased balance;Decreased mobility;Decreased coordination;Decreased knowledge of use of DME;Decreased knowledge of precautions;Pain       PT Treatment Interventions DME instruction;Gait training;Functional mobility training;Therapeutic activities;Therapeutic exercise;Balance training;Patient/family education;Neuromuscular re-education    PT Goals (Current goals can be found in the Care Plan section)  Acute Rehab PT Goals Patient Stated Goal: go home, not go to a SNF PT Goal Formulation: With patient Time For Goal Achievement: 07/06/17 Potential to Achieve Goals: Good    Frequency Min 3X/week   Barriers to discharge Decreased caregiver support assist only intermittently    Co-evaluation               AM-PAC PT "6 Clicks" Daily Activity  Outcome Measure Difficulty turning over in bed (including adjusting bedclothes, sheets and blankets)?: Unable Difficulty moving from lying on back to sitting on the side of the bed? : Unable Difficulty sitting down on and standing up from a chair with arms (e.g., wheelchair, bedside commode, etc,.)?: Unable Help  needed moving to and from a bed to chair (including a wheelchair)?: A Lot Help needed walking in hospital room?: A Lot Help needed climbing 3-5 steps with a railing? : Total 6 Click Score: 8    End of Session Equipment Utilized During Treatment: Other (comment)(sling) Activity Tolerance: Patient tolerated treatment well Patient left: in bed;with call bell/phone within reach;with family/visitor present Nurse Communication: Mobility status PT Visit Diagnosis: Unsteadiness on feet (R26.81);Hemiplegia and hemiparesis;Pain;Difficulty in walking, not elsewhere classified (R26.2) Hemiplegia - Right/Left: Right Hemiplegia - dominant/non-dominant: Dominant Hemiplegia - caused by: Cerebral infarction Pain - Right/Left: Right Pain - part of body: Shoulder    Time: 3267-1245 PT Time Calculation (min) (ACUTE ONLY): 31 min   Charges:   PT Evaluation $PT Eval Moderate Complexity: 1 Mod PT Treatments $Therapeutic Activity: 8-22 mins   PT G Codes:        Leighton Roach, PT  Acute Rehab Services  [place] 06/22/2017, 4:40 PM

## 2017-06-22 NOTE — Brief Op Note (Signed)
06/22/2017  1:49 PM  PATIENT:  Roberta Bryant  67 y.o. female  PRE-OPERATIVE DIAGNOSIS:  right shoulder full-thickness rotator cuff tear  POST-OPERATIVE DIAGNOSIS:  right shoulder full-thickness rotator cuff tear  PROCEDURE:  Procedure(s): RIGHT SHOULDER ARTHROSCOPY WITH EXTENSIVE DEBRIDEMENT (Right)  SURGEON:  Surgeon(s) and Role:    Mcarthur Rossetti, MD - Primary  PHYSICIAN ASSISTANT: Benita Stabile, PA-C  ANESTHESIA:   general  EBL:  5 mL   COUNTS:  YES  DICTATION: .Other Dictation: Dictation Number [id number]  PLAN OF CARE: Admit to inpatient   PATIENT DISPOSITION:  PACU - hemodynamically stable.   Delay start of Pharmacological VTE agent (>24hrs) due to surgical blood loss or risk of bleeding: no

## 2017-06-22 NOTE — Anesthesia Preprocedure Evaluation (Addendum)
Anesthesia Evaluation  Patient identified by MRN, date of birth, ID band Patient awake    Reviewed: Allergy & Precautions, NPO status , Patient's Chart, lab work & pertinent test results  Airway Mallampati: II  TM Distance: >3 FB Neck ROM: Full    Dental no notable dental hx.    Pulmonary asthma , former smoker,    breath sounds clear to auscultation       Cardiovascular hypertension, + CAD, + Past MI and +CHF   Rhythm:Regular Rate:Normal   - Left ventricle: The cavity size was normal. Wall thickness was   increased in a pattern of moderate LVH. Systolic function was   normal. The estimated ejection fraction was in the range of 50%   to 55%. Wall motion was normal; there were no regional wall   motion abnormalities. Features are consistent with a pseudonormal   left ventricular filling pattern, with concomitant abnormal   relaxation and increased filling pressure (grade 2 diastolic   dysfunction). Doppler parameters are consistent with high   ventricular filling pressure. - Aortic valve: Valve mobility was moderately restricted. There was   moderate stenosis. There was mild regurgitation, with a single   jet originating from the central coaptation point. Mean gradient   (S): 25 mm Hg. Peak gradient (S): 49 mm Hg. Valve area (VTI):   0.98 cm^2. Valve area (Vmax): 1.03 cm^2. Valve area (Vmean): 1.05   cm^2. - Mitral valve: Mildly calcified annulus. Normal thickness leaflets   . There was mild regurgitation. - Left atrium: The atrium was moderately    Neuro/Psych  Neuromuscular disease CVA    GI/Hepatic GERD  ,  Endo/Other  diabetes, Well ControlledHypothyroidism Morbid obesity  Renal/GU ESRF and DialysisRenal disease     Musculoskeletal   Abdominal (+) + obese,   Peds  Hematology  (+) anemia ,   Anesthesia Other Findings   Reproductive/Obstetrics                            Anesthesia  Physical Anesthesia Plan  ASA: IV  Anesthesia Plan: General   Post-op Pain Management:    Induction: Intravenous  PONV Risk Score and Plan: 4 or greater and Treatment may vary due to age or medical condition, Dexamethasone and Ondansetron  Airway Management Planned: Oral ETT and Video Laryngoscope Planned  Additional Equipment:   Intra-op Plan:   Post-operative Plan: Extubation in OR  Informed Consent: I have reviewed the patients History and Physical, chart, labs and discussed the procedure including the risks, benefits and alternatives for the proposed anesthesia with the patient or authorized representative who has indicated his/her understanding and acceptance.   Dental advisory given  Plan Discussed with: CRNA  Anesthesia Plan Comments:        Anesthesia Quick Evaluation

## 2017-06-22 NOTE — Progress Notes (Signed)
Spoke with Dr Delia Chimes about location if IV states to place iv in the foot.

## 2017-06-22 NOTE — Anesthesia Procedure Notes (Addendum)
Procedure Name: Intubation Date/Time: 06/22/2017 12:46 PM Performed by: Glynda Jaeger, CRNA Pre-anesthesia Checklist: Patient identified, Emergency Drugs available, Suction available and Patient being monitored Patient Re-evaluated:Patient Re-evaluated prior to induction Oxygen Delivery Method: Circle System Utilized Preoxygenation: Pre-oxygenation with 100% oxygen Induction Type: IV induction Ventilation: Oral airway inserted - appropriate to patient size and Mask ventilation with difficulty Laryngoscope Size: Glidescope and 3 Grade View: Grade I Tube type: Oral Tube size: 7.0 mm Number of attempts: 1 Airway Equipment and Method: Oral airway and Video-laryngoscopy Placement Confirmation: ETT inserted through vocal cords under direct vision,  positive ETCO2 and breath sounds checked- equal and bilateral Secured at: 21 cm Tube secured with: Tape Dental Injury: Teeth and Oropharynx as per pre-operative assessment  Comments: Elective glidescope

## 2017-06-23 ENCOUNTER — Telehealth: Payer: Self-pay | Admitting: Neurology

## 2017-06-23 ENCOUNTER — Encounter (HOSPITAL_COMMUNITY): Payer: Self-pay | Admitting: Orthopaedic Surgery

## 2017-06-23 DIAGNOSIS — M75121 Complete rotator cuff tear or rupture of right shoulder, not specified as traumatic: Secondary | ICD-10-CM | POA: Diagnosis not present

## 2017-06-23 MED ORDER — TIZANIDINE HCL 2 MG PO TABS: 2.0000 mg | ORAL_TABLET | Freq: Three times a day (TID) | ORAL | 1 refills | Status: DC | PRN

## 2017-06-23 MED ORDER — HYDROCODONE-ACETAMINOPHEN 5-325 MG PO TABS: 1.0000 | ORAL_TABLET | Freq: Four times a day (QID) | ORAL | 0 refills | Status: DC | PRN

## 2017-06-23 NOTE — Progress Notes (Signed)
Physical Therapy Treatment Patient Details Name: Roberta Bryant MRN: [id number] DOB: Feb 09, 1951 Today's Date: 06/23/2017    History of Present Illness The patient is a 67 year old female with multiple medical problems including history of a stroke.  She has had debilitating right shoulder pain for some time with residual right upper extremity weakness as it relates to her stroke.  We have tried conservative treatment with injections and therapy.  Nothing is helped.  An MRI did show a near full-thickness rotator cuff tear.  At this point or in the family wishes to proceed with surgery to at least hopefully decrease her pain and improve the shoulder function which she understands it is going to be significantly difficult given the residual effects of her stroke.    PT Comments    Continuing work on functional mobility and activity tolerance;  Pt very much wants to be abel to walk better without an assistive device; generally unsteady with and without a RW; I heartily agree with HHPT/OT follow up   Follow Up Recommendations  Home health PT;Other (comment)(HHaide)     Equipment Recommendations  3in1 (PT)    Recommendations for Other Services       Precautions / Restrictions Precautions Precautions: Fall Required Braces or Orthoses: Sling Restrictions Weight Bearing Restrictions: Yes Other Position/Activity Restrictions: assume NWB in RUE     Mobility  Bed Mobility               General bed mobility comments: sitting EOB upon entering room   Transfers Overall transfer level: Needs assistance Equipment used: 1 person hand held assist Transfers: Sit to/from Stand Sit to Stand: Min assist         General transfer comment: Pt utilizing rocking method and with HHA using LUE to rise to standing  Ambulation/Gait Ambulation/Gait assistance: Min guard Ambulation Distance (Feet): 20 Feet Assistive device: None Gait Pattern/deviations: Decreased step length -  right;Decreased step length - left;Trunk flexed;Shuffle     General Gait Details: Pt very much wanting to walk without an assistive device; unsteady throughout, but no gross loss of balance; briefly tried walking with RW for stability, and did not seem much more steady with it; used RW for steadiness, minimal weight bearing RUE   Stairs            Wheelchair Mobility    Modified Rankin (Stroke Patients Only)       Balance Overall balance assessment: Needs assistance Sitting-balance support: Single extremity supported Sitting balance-Leahy Scale: Good Sitting balance - Comments: static sitting EOB    Standing balance support: Single extremity supported;Bilateral upper extremity supported;No upper extremity supported Standing balance-Leahy Scale: Fair Standing balance comment: Pt slightly unsteady without UE support though able to maintain static balance with close MinGuard                             Cognition Arousal/Alertness: Awake/alert Behavior During Therapy: WFL for tasks assessed/performed Overall Cognitive Status: Within Functional Limits for tasks assessed                                        Exercises Shoulder Exercises Wrist Flexion: AROM;Right;5 reps;Seated Wrist Extension: AROM;Right;5 reps;Seated Digit Composite Flexion: AROM;5 reps;Right;Seated Composite Extension: AROM;5 reps;Right;Seated Neck Flexion: AROM;Seated Neck Extension: AROM;Seated Neck Lateral Flexion - Right: AROM;Seated Neck Lateral Flexion - Left: AROM;Seated Donning/doffing shirt without moving  shoulder: Minimal assistance;Patient able to independently direct caregiver;Caregiver independent with task Method for sponge bathing under operated UE: Min-guard Donning/doffing sling/immobilizer: Maximal assistance;Patient able to independently direct caregiver;Caregiver independent with task Correct positioning of sling/immobilizer: Maximal assistance;Patient able  to independently direct caregiver;Caregiver independent with task ROM for elbow, wrist and digits of operated UE: Min-guard Sling wearing schedule (on at all times/off for ADL's): Minimal assistance Proper positioning of operated UE when showering: Min-guard Positioning of UE while sleeping: Minimal assistance    General Comments General comments (skin integrity, edema, etc.): Pt's son present and education provided to both Pt/Pt's son on sling management, safety and compensatory techniques for completing mobility and ADLs; attempted to contact MD office for clarification re: shoulder precautions and any ROM/WB restrictions, assumbed NWB and education on completing ADLs with passive/minimal shoulder movements for safety; recommended Pt further clarify with MD at follow-up appointment      Pertinent Vitals/Pain Pain Assessment: 0-10 Pain Score: 5  Pain Location: R shoulder Pain Descriptors / Indicators: Aching;Constant;Grimacing Pain Intervention(s): Monitored during session    Home Living Family/patient expects to be discharged to:: Private residence Living Arrangements: Children Available Help at Discharge: Family;Available PRN/intermittently Type of Home: House Home Access: Ramped entrance   Home Layout: One level Home Equipment: Walker - standard;Wheelchair - manual Additional Comments: pt's 3 sons live with her but they all work and she is home alone during that time    Prior Function Level of Independence: Independent with assistive device(s)      Comments: pt ambulates some without AD and some with RW   PT Goals (current goals can now be found in the care plan section) Acute Rehab PT Goals Patient Stated Goal: go home, not go to a SNF PT Goal Formulation: With patient Time For Goal Achievement: 07/06/17 Potential to Achieve Goals: Good Progress towards PT goals: Progressing toward goals    Frequency    Min 3X/week      PT Plan Current plan remains appropriate     Co-evaluation PT/OT/SLP Co-Evaluation/Treatment: Yes Reason for Co-Treatment: For patient/therapist safety PT goals addressed during session: Mobility/safety with mobility OT goals addressed during session: ADL's and self-care      AM-PAC PT "6 Clicks" Daily Activity  Outcome Measure  Difficulty turning over in bed (including adjusting bedclothes, sheets and blankets)?: A Lot Difficulty moving from lying on back to sitting on the side of the bed? : A Lot Difficulty sitting down on and standing up from a chair with arms (e.g., wheelchair, bedside commode, etc,.)?: Unable Help needed moving to and from a bed to chair (including a wheelchair)?: A Little Help needed walking in hospital room?: A Little Help needed climbing 3-5 steps with a railing? : Total 6 Click Score: 12    End of Session Equipment Utilized During Treatment: Gait belt Activity Tolerance: Patient tolerated treatment well Patient left: in bed;Other (comment)(sitting EOB and continuing with OT) Nurse Communication: Mobility status PT Visit Diagnosis: Unsteadiness on feet (R26.81);Hemiplegia and hemiparesis;Pain;Difficulty in walking, not elsewhere classified (R26.2) Hemiplegia - Right/Left: Right Hemiplegia - dominant/non-dominant: Dominant Hemiplegia - caused by: Cerebral infarction Pain - Right/Left: Right Pain - part of body: Shoulder     Time: 4098-1191 PT Time Calculation (min) (ACUTE ONLY): 10 min  Charges:  $Gait Training: 8-22 mins                    G Codes:       Roney Marion, PT  [place] Pager [phone] Office [phone]  Colletta [place] 06/23/2017, 4:28 PM

## 2017-06-23 NOTE — Discharge Instructions (Signed)
Increase activities with your right arm and shoulder as comfort allows. You can come out of your sling to move your shoulder as much as possible and only wear your sling for comfort. Ice as needed for swelling. You can get your incisions wet daily in the shower and apply band-aids daily as needed.

## 2017-06-23 NOTE — Care Management Note (Signed)
Case Management Note  Patient Details  Name: HARBOR PASTER MRN: [id number] Date of Birth: [birth year]  Subjective/Objective:                    Action/Plan:   Expected Discharge Date:  06/23/17               Expected Discharge Plan:  [place]  In-House Referral:     Discharge planning Services  CM Consult  Post Acute Care Choice:  Home Health, Durable Medical Equipment Choice offered to:  Patient  DME Arranged:  3-N-1 DME Agency:  [place]:  PT, OT [place] Agency:  [facility] (now [facility])  Status of Service:  Completed, signed off  If discussed at [place] of Stay Meetings, dates discussed:    Additional Comments:  Marilu Favre, RN 06/23/2017, 12:01 PM

## 2017-06-23 NOTE — Progress Notes (Signed)
Patient ID: Roberta Bryant, female   DOB: Oct 24, 1950, 67 y.o.   MRN: [id number] Tolerated surgery on her right shoulder.  Incisions all clean and intact.  Band-aides applied.  Can be discharged to home today.

## 2017-06-23 NOTE — Telephone Encounter (Signed)
Pt's son called to advise she had rotator cuff surgery yesterday at [facility]. FYI

## 2017-06-23 NOTE — Discharge Summary (Signed)
Patient ID: Roberta Bryant MRN: [id number] DOB/AGE: May 16, 1951 67 y.o.  Admit date: 06/22/2017 Discharge date: 06/23/2017  Admission Diagnoses:  Principal Problem:   Complete tear of right rotator cuff Active Problems:   Status post arthroscopy of right shoulder   Discharge Diagnoses:  Same  Past Medical History:  Diagnosis Date  . Anemia   . Aortic stenosis   . CHF (congestive heart failure) ([place])   . Childhood asthma   . Chronic upper back pain   . Coronary artery disease    Stent to left main coronary artery second of August 2017  . ESRD (end stage renal disease) on dialysis ([place])    "TTS; [facility] in [place]" (06/22/2017)  . Gait abnormality 08/28/2016  . GERD (gastroesophageal reflux disease)   . Headache    "a few times/week" (10/30/2014)  . Headache syndrome 01/08/2017  . History of blood transfusion 1996   "related to menses"  . Hyperlipidemia   . Hypertension   . Hypothyroidism   . IBS (irritable bowel syndrome)   . Myocardial infarction ([place]) 2004  . Osteoarthritis   . Pneumonia    "years ago"  . Spondylosis of cervical spine 06/16/2017  . Stroke ([place]) 07/2016   mini stroke , right side of body weak  . Type II diabetes mellitus ([place])     Surgeries: Procedure(s): RIGHT SHOULDER ARTHROSCOPY WITH EXTENSIVE DEBRIDEMENT on 06/22/2017   Consultants:   Discharged Condition: Improved  Hospital Course: Roberta Bryant is an 67 y.o. female who was admitted 06/22/2017 for operative treatment ofComplete tear of right rotator cuff. Patient has severe unremitting pain that affects sleep, daily activities, and work/hobbies. After pre-op clearance the patient was taken to the operating room on 06/22/2017 and underwent  Procedure(s): RIGHT SHOULDER ARTHROSCOPY WITH EXTENSIVE DEBRIDEMENT.    Patient was given perioperative antibiotics:  Anti-infectives (From admission, onward)   Start     Dose/Rate Route Frequency Ordered Stop   06/22/17 1100  clindamycin (CLEOCIN)  IVPB 900 mg     900 mg 100 mL/hr over 30 Minutes Intravenous To [facility] 06/21/17 1331 06/22/17 1250       Patient was given sequential compression devices, early ambulation, and chemoprophylaxis to prevent DVT.  Patient benefited maximally from hospital stay and there were no complications.    Recent vital signs:  Patient Vitals for the past 24 hrs:  BP Temp Temp src Pulse Resp SpO2 Height Weight  06/23/17 0429 (!) 146/52 98 F (36.7 C) Oral (!) 57 18 97 % - -  06/23/17 0059 (!) 151/57 - - (!) 55 18 98 % - -  06/22/17 2201 (!) 176/44 98.1 F (36.7 C) Oral (!) 58 17 97 % - -  06/22/17 1851 140/60 98.6 F (37 C) Oral (!) 56 16 100 % - -  06/22/17 1549 (!) 164/64 (!) 97.5 F (36.4 C) Oral (!) 54 16 100 % 4\' 10"  (1.473 m) 203 lb 4.8 oz (92.2 kg)  06/22/17 1500 (!) 165/60 (!) 97.2 F (36.2 C) - (!) 58 15 100 % - -  06/22/17 1445 (!) 171/60 - - (!) 58 13 100 % - -  06/22/17 1430 (!) 175/63 - - 61 12 99 % - -  06/22/17 1415 (!) 171/66 - - 69 13 100 % - -  06/22/17 1400 (!) 190/78 (!) 97.2 F (36.2 C) - 80 (!) 22 98 % - -  06/22/17 1018 - - - - - - 4\' 10"  (1.473 m) 196 lb (88.9 kg)  06/22/17 1015 (!) 184/62 98.5 F (36.9 C) Oral 62 19 100 % - -     Recent laboratory studies:  Recent Labs    06/22/17 1029  HGB 11.9*  HCT 35.0*  NA 137  K 4.2  GLUCOSE 92     Discharge Medications:   Allergies as of 06/23/2017      Reactions   Sulfa Antibiotics Anaphylaxis   Amoxicillin Hives   Crestor [rosuvastatin Calcium] Other (See Comments)   Dizziness, couldn't walk   Ibuprofen Hives   Nal[place] Senior [guaifenesin] Hives   Pravastatin Hives, Other (See Comments)   UNSPECIFIED REACTION    Statins Hives   Chlorphen-phenyleph-asa Rash   Gabapentin Other (See Comments)   Dizzy   Rosuvastatin Other (See Comments)   dizziness      Medication List    STOP taking these medications   oxyCODONE 5 MG immediate release tablet Commonly known as:  Oxy IR/ROXICODONE      TAKE these medications   acetaminophen 325 MG tablet Commonly known as:  TYLENOL Take 650 mg by mouth every 6 (six) hours as needed for moderate pain or headache.   aspirin EC 81 MG tablet Take 81 mg by mouth every morning.   carvedilol 12.5 MG tablet Commonly known as:  COREG Take 25 mg by mouth at bedtime.   clonazePAM 0.5 MG tablet Commonly known as:  KLONOPIN Take 1 tablet (0.5 mg total) by mouth at bedtime.   docusate sodium 100 MG capsule Commonly known as:  COLACE Take 100 mg by mouth 2 (two) times daily.   ezetimibe 10 MG tablet Commonly known as:  ZETIA Take 10 mg by mouth daily.   Fenofibrate 120 MG Tabs Take 1 tablet (120 mg total) by mouth daily.   fenofibrate 160 MG tablet Take 160 mg by mouth daily.   finasteride 5 MG tablet Commonly known as:  PROSCAR Take 2.5 mg by mouth daily.   HYDROcodone-acetaminophen 5-325 MG tablet Commonly known as:  NORCO Take 1-2 tablets by mouth every 6 (six) hours as needed. What changed:  how much to take   lanthanum 1000 MG chewable tablet Commonly known as:  FOSRENOL Chew 1,000 mg by mouth 3 (three) times daily after meals.   levothyroxine 50 MCG tablet Commonly known as:  SYNTHROID, LEVOTHROID Take 50 mcg by mouth daily before breakfast.   loperamide 2 MG tablet Commonly known as:  IMODIUM A-D Take 2 mg by mouth 4 (four) times daily as needed for diarrhea or loose stools.   meclizine 25 MG tablet Commonly known as:  ANTIVERT Take 50 mg by mouth daily as needed for dizziness.   multivitamin Tabs tablet Take 1 tablet by mouth daily.   omeprazole 20 MG capsule Commonly known as:  PRILOSEC Take 20 mg by mouth 2 (two) times daily before a meal.   sitaGLIPtin 25 MG tablet Commonly known as:  JANUVIA Take 25 mg by mouth daily.   tiZANidine 2 MG tablet Commonly known as:  ZANAFLEX Take 1 tablet (2 mg total) by mouth every 8 (eight) hours as needed for muscle spasms. What changed:    when to take  this  reasons to take this   VICTOZA 18 MG/3ML Sopn Generic drug:  liraglutide Inject 1.8 mg into the skin daily.   Vitamin D3 50000 units Caps Take 50,000 Units by mouth every 30 (thirty) days.   Zinc 50 MG Caps Take 50 mg by mouth every morning.       Diagnostic Studies:  Ct Cervical Spine Wo Contrast  Result Date: 05/28/2017 CLINICAL DATA:  Left-sided neck pain EXAM: CT CERVICAL SPINE WITHOUT CONTRAST TECHNIQUE: Multidetector CT imaging of the cervical spine was performed without intravenous contrast. Multiplanar CT image reconstructions were also generated. COMPARISON:  11/24/2016, 06/13/2016 FINDINGS: Alignment: Trace anterolisthesis of C4 on C5 unchanged. The set alignment within normal limits. Skull base and vertebrae: No acute fracture. No primary bone lesion or focal pathologic process. Soft tissues and spinal canal: No prevertebral fluid or swelling. No visible canal hematoma. Disc levels: Moderate degenerative changes at C4-C5 and C6-C7. Multilevel bilateral facet arthropathy. Multiple levels of foraminal stenosis, most marked on the right side at C3-C4 and C4-C5 and on the left at C2-C3. Upper chest: Negative. Other: None IMPRESSION: 1. Trace anterolisthesis of L4 on L5, unchanged. Moderate degenerative changes most notable at C4-C5 and C6-C7. 2. No acute osseous abnormality. Electronically Signed   By: Donavan Foil M.D.   On: 05/28/2017 19:59   Xr Shoulder Left  Result Date: 05/24/2017 Left shoulder 3 views: Humeral heads well located.  Glenohumeral joint appears well-preserved.  Y-view shows good subacromial space.  The upper chest lung field appears clear.  No apparent rib fractures are seen.  No other bony abnormalities.   Disposition: 01-Home or Self Care    Follow-up Information    Mcarthur Rossetti, MD Follow up in 1 week(s).   Specialty:  Orthopedic Surgery Contact information: [place] [address] [phone]             Signed: Mcarthur Rossetti 06/23/2017, 7:52 AM

## 2017-06-23 NOTE — Care Management Note (Signed)
Case Management Note  Patient Details  Name: MAKENDRA VIGEANT MRN: [id number] Date of Birth: 02/15/1951  Subjective/Objective:                    Action/Plan:   Expected Discharge Date:  06/23/17               Expected Discharge Plan:  [place]  In-House Referral:     Discharge planning Services  CM Consult  Post Acute Care Choice:  Home Health Choice offered to:  Patient  DME Arranged:  N/A DME Agency:  NA  HH Arranged:  PT, OT [place] Agency:  [facility] (now [facility])  Status of Service:  Completed, signed off  If discussed at [place] of Stay Meetings, dates discussed:    Additional Comments:  Marilu Favre, RN 06/23/2017, 10:09 AM

## 2017-06-23 NOTE — Evaluation (Signed)
Occupational Therapy Evaluation Patient Details Name: Roberta Bryant MRN: [id number] DOB: Oct 20, 1950 Today's Date: 06/23/2017    History of Present Illness The patient is a 67 year old female with multiple medical problems including history of a stroke.  She has had debilitating right shoulder pain for some time with residual right upper extremity weakness as it relates to her stroke.  We have tried conservative treatment with injections and therapy.  Nothing is helped.  An MRI did show a near full-thickness rotator cuff tear.  At this point or in the family wishes to proceed with surgery to at least hopefully decrease her pain and improve the shoulder function which she understands it is going to be significantly difficult given the residual effects of her stroke.   Clinical Impression   This 67 y/o F presents with the above. Pt lives with her sons, at baseline reports using RW and w/c for functional mobility though has recently been working on mobility without AD. Reports mod independence with ADL completion. Pt completing functional mobility within room with and without AD with MinGuard-minA throughout. Son present and education provided to Pt/Pt's son on safety and compensatory techniques for completing ADLs and functional transfers while protecting R shoulder with Pt/Pt's son verbalizing understanding. Recommend Pt follow-up as arranged with MD regarding progression of shoulder mobility. Recommend additional HHOT services to maximize her safety and independence with ADLs and functional mobility after return home.     Follow Up Recommendations  Home health OT;Supervision/Assistance - 24 hour    Equipment Recommendations  3 in 1 bedside commode           Precautions / Restrictions Precautions Precautions: Fall Required Braces or Orthoses: Sling Restrictions Weight Bearing Restrictions: Yes Other Position/Activity Restrictions: assume NWB in RUE       Mobility Bed Mobility                General bed mobility comments: sitting EOB upon entering room   Transfers Overall transfer level: Needs assistance Equipment used: 1 person hand held assist Transfers: Sit to/from Stand Sit to Stand: Min assist         General transfer comment: Pt utilizing rocking method and with HHA using LUE to rise to standing    Balance Overall balance assessment: Needs assistance Sitting-balance support: Single extremity supported Sitting balance-Leahy Scale: Good Sitting balance - Comments: static sitting EOB    Standing balance support: Single extremity supported;Bilateral upper extremity supported;No upper extremity supported Standing balance-Leahy Scale: Fair Standing balance comment: Pt slightly unsteady without UE support though able to maintain static balance with close MinGuard                            ADL either performed or assessed with clinical judgement   ADL Overall ADL's : Needs assistance/impaired Eating/Feeding: Modified independent;Sitting   Grooming: Min guard;Sitting   Upper Body Bathing: Minimal assistance;Sitting   Lower Body Bathing: Minimal assistance;Sit to/from stand   Upper Body Dressing : Minimal assistance;Sitting;Maximal assistance Upper Body Dressing Details (indicate cue type and reason): MaxA for donning/adjusting sling; Pt's son present for sling education with Pt/Pt's son verbalizing understanding      Toilet Transfer: Minimal assistance;Ambulation;BSC Toilet Transfer Details (indicate cue type and reason): simulated in transfer to/from EOB  Toileting- Clothing Manipulation and Hygiene: Minimal assistance;Sit to/from Nurse, children's Details (indicate cue type and reason): educated on safety during shower completion and use of BSC/shower seat for  increased safety with Pt verbalizing understanding  Functional mobility during ADLs: Minimal assistance;Min guard;Rolling walker General ADL Comments: Pt  completing room level functional mobility with and without RW with MinGuard-MinA (+2 for safety); education provided on safety during ADL completion as well as compensatory techniques for completing ADLs while protecting R shoulder; Pt dressed upon entering room however not wearing sling, educated on importance of wearing sling to protect UE especially during mobility or during activity with Pt verbalizing understanding                          Pertinent Vitals/Pain Pain Assessment: 0-10 Pain Score: 5  Pain Location: R shoulder Pain Descriptors / Indicators: Aching;Constant;Grimacing Pain Intervention(s): Limited activity within patient's tolerance;Monitored during session;Repositioned     Hand Dominance Right   Extremity/Trunk Assessment Upper Extremity Assessment Upper Extremity Assessment: RUE deficits/detail;LUE deficits/detail RUE: Unable to fully assess due to immobilization;Unable to fully assess due to pain RUE Coordination: decreased gross motor;decreased fine motor LUE Deficits / Details: pt reports CTS B hands and thus, decreased grip strength L hand   Lower Extremity Assessment Lower Extremity Assessment: Defer to PT evaluation   Cervical / Trunk Assessment Cervical / Trunk Assessment: Normal   Communication Communication Communication: No difficulties   Cognition Arousal/Alertness: Awake/alert Behavior During Therapy: WFL for tasks assessed/performed Overall Cognitive Status: Within Functional Limits for tasks assessed                                     General Comments  Pt's son present and education provided to both Pt/Pt's son on sling management, safety and compensatory techniques for completing mobility and ADLs; attempted to contact MD office for clarification re: shoulder precautions and any ROM/WB restrictions, assumbed NWB and education on completing ADLs with passive/minimal shoulder movements for safety; recommended Pt further clarify  with MD at follow-up appointment    Exercises Shoulder Exercises Wrist Flexion: AROM;Right;5 reps;Seated Wrist Extension: AROM;Right;5 reps;Seated Digit Composite Flexion: AROM;5 reps;Right;Seated Composite Extension: AROM;5 reps;Right;Seated Neck Flexion: AROM;Seated Neck Extension: AROM;Seated Neck Lateral Flexion - Right: AROM;Seated Neck Lateral Flexion - Left: AROM;Seated   Shoulder Instructions Shoulder Instructions Donning/doffing shirt without moving shoulder: Minimal assistance;Patient able to independently direct caregiver;Caregiver independent with task Method for sponge bathing under operated UE: Min-guard Donning/doffing sling/immobilizer: Maximal assistance;Patient able to independently direct caregiver;Caregiver independent with task Correct positioning of sling/immobilizer: Maximal assistance;Patient able to independently direct caregiver;Caregiver independent with task ROM for elbow, wrist and digits of operated UE: Min-guard Sling wearing schedule (on at all times/off for ADL's): Minimal assistance Proper positioning of operated UE when showering: Min-guard Positioning of UE while sleeping: Minimal assistance    Home Living Family/patient expects to be discharged to:: Private residence Living Arrangements: Children Available Help at Discharge: Family;Available PRN/intermittently Type of Home: House Home Access: Ramped entrance     Home Layout: One level     Bathroom Shower/Tub: Walk-in shower         Home Equipment: Walker - standard;Wheelchair - manual   Additional Comments: pt's 3 sons live with her but they all work and she is home alone during that time      Prior Functioning/Environment Level of Independence: Independent with assistive device(s)        Comments: pt ambulates some without AD and some with RW        OT Problem List: Decreased strength;Decreased knowledge of use  of DME or AE;Decreased range of motion;Impaired UE functional  use;Decreased activity tolerance;Pain      OT Treatment/Interventions:      OT Goals(Current goals can be found in the care plan section) Acute Rehab OT Goals Patient Stated Goal: go home, not go to a SNF OT Goal Formulation: All assessment and education complete, DC therapy                   Co-evaluation PT/OT/SLP Co-Evaluation/Treatment: Yes Reason for Co-Treatment: For patient/therapist safety;To address functional/ADL transfers PT goals addressed during session: Mobility/safety with mobility;Balance OT goals addressed during session: ADL's and self-care      AM-PAC PT "6 Clicks" Daily Activity     Outcome Measure Help from another person eating meals?: A Little Help from another person taking care of personal grooming?: A Little Help from another person toileting, which includes using toliet, bedpan, or urinal?: A Little Help from another person bathing (including washing, rinsing, drying)?: A Little Help from another person to put on and taking off regular upper body clothing?: A Little Help from another person to put on and taking off regular lower body clothing?: A Little 6 Click Score: 18   End of Session Equipment Utilized During Treatment: Other (comment)(sling ) Nurse Communication: Mobility status  Activity Tolerance: Patient tolerated treatment well Patient left: Other (comment);with family/visitor present(sitting EOB)  OT Visit Diagnosis: Other abnormalities of gait and mobility (R26.89);Muscle weakness (generalized) (M62.81)                Time: 5945-8592 OT Time Calculation (min): 24 min Charges:  OT General Charges $OT Visit: 1 Visit OT Evaluation $OT Eval Low Complexity: 1 Low G-Codes:     Lou Cal, OT Pager [phone] 06/23/2017   Raymondo Band 06/23/2017, 3:35 PM

## 2017-06-23 NOTE — Progress Notes (Signed)
Pt discharged to home with home health.  DME given to pt at time of discharge.  Discharge instructions and prescriptions given.

## 2017-06-23 NOTE — Op Note (Signed)
Roberta Bryant, Roberta Bryant            ACCOUNT NO.:  [id number]  MEDICAL RECORD NO.:  [id number]  LOCATION:                                 FACILITY:  PHYSICIAN:  Lind Guest. Ninfa Linden, M.D.DATE OF BIRTH:  [birth year]  DATE OF PROCEDURE:  06/22/2017 DATE OF DISCHARGE:                              OPERATIVE REPORT   PREOPERATIVE DIAGNOSIS:  Right shoulder full-thickness rotator cuff tear.  POSTOPERATIVE DIAGNOSES: 1. Right shoulder full-thickness rotator cuff tear. 2. 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  PROCEDURE:  Right shoulder arthroscopy with extensive debridement.  SURGEON:  Lind Guest. Ninfa Linden, M.D.  ASSISTANT:  Erskine Emery, PA-C.  ANESTHESIA:  General.  BLOOD LOSS:  Minimal.  COMPLICATIONS:  None.  INDICATIONS:  Roberta Bryant is a 67 year old obese female, who unfortunately had a stroke in the past.  This created profound weakness of her right shoulder, but she has also had profound right shoulder pain.  We have tried and failed all forms of conservative treatment measures.  An MRI in December 2017 showed a full-thickness rotator cuff tear with some retraction.  There was no significant muscle atrophy and some glenohumeral arthritic changes.  Over the last year, she developed worsening shoulder pain.  We have tried and failed again conservative treatment.  At this point, she wishes for an arthroscopic intervention with the goals of hopefully decreasing her pain.  I could not guarantee that we could restore the rotator cuff function because of the residual effects of her stroke.  She is hoping to be able to lift her arm up enough to wash her hair and that may be difficult and I have explained this to her in detail.  We are trying to get an understanding of that the surgery is hopefully more for pain control than anything that she would still need extensive therapy.  She, first thing, is also on dialysis as  well.  PROCEDURE DESCRIPTION:  After informed consent was obtained, appropriate right shoulder was marked.  She was brought to the operating room where general anesthesia was obtained while she was supine on the operating table.  She was then fashioned in a beach chair position with appropriate positioning of the head and neck and padding of the down on operative right arm.  There was bending at the waist and knees, and we did not fully set her up straight, we kept her laying back.  We then prepped the right shoulder with DuraPrep and sterile drapes.  I did put her shoulder through range of motion prior to prepping and there was no significant arthrofibrosis.  Once she was prepped and draped, we put her in 10 pounds of In-Line skeletal traction with a neutral rotation and 45 degrees of forward flexion.  A time-out was called and she was identified as correct patient and correct right shoulder.  I then made a posterior lateral arthroscopy portal and entered the glenohumeral joint and found profound cartilage loss on the humeral head and the glenoid cell as well as inflamed tissue throughout the glenohumeral joint and loose bodies.  We made an anterior incision in the rotator interval and you could see just looking up that there  was a full-thickness retracted rotator cuff tear.  Using a soft tissue ablation wand and arthroscopic shaver, I removed loose bodies and performed a significant extensive debridement in the glenohumeral joint.  I then entered the subacromial space through the posterior portal, made a separate lateral portal.  I removed a significant inflamed bursa and as much inflamed tissue as I could from the shoulder.  I found the bone of the humeral head to be soft and the collagen fibers of the rotator cuff were not significantly strong.  At that point, I did not feel comfortable proceeding with any type of rotator cuff repair, although I do feel that we may have been able to  repair the rotator cuff.  I do not think that the anchor would hold in the bone and this would not treat the severe and profound glenohumeral arthritis that she has.  I do feel that she should be considered as a potential candidate for a reverse total shoulder arthroplasty; however, the extenuating fracture is also the fact that she is on dialysis which makes her higher infection risk.  Once I finished with the extensive debridement in both the glenohumeral joint and the subacromial space, we removed all instrumentation and closed the portal sites with interrupted nylon suture.  Xeroform and well-padded sterile dressing was applied and she was placed in a sling.  She was awakened, extubated, and extubated taken to the recovery room in stable condition.  All final counts were correct.  There were no complications noted.     Lind Guest. Ninfa Linden, M.D.   ______________________________ Lind Guest. Ninfa Linden, M.D.    CYB/MEDQ  D:  06/22/2017  T:  06/22/2017  Job:  [id number]

## 2017-06-24 NOTE — Addendum Note (Signed)
Addendum  created 06/24/17 3474 by Glynda Jaeger, CRNA   Charge Capture section accepted, Visit diagnoses modified

## 2017-06-25 ENCOUNTER — Telehealth (INDEPENDENT_AMBULATORY_CARE_PROVIDER_SITE_OTHER): Payer: Self-pay | Admitting: Orthopaedic Surgery

## 2017-06-25 MED ORDER — TIZANIDINE HCL 2 MG PO TABS: 2.0000 mg | ORAL_TABLET | Freq: Three times a day (TID) | ORAL | 0 refills | Status: DC | PRN

## 2017-06-25 NOTE — Telephone Encounter (Signed)
Refill submitted. 

## 2017-06-25 NOTE — Telephone Encounter (Signed)
Ok to refill. Thanks 

## 2017-06-25 NOTE — Telephone Encounter (Signed)
Ok to fill 

## 2017-06-25 NOTE — Telephone Encounter (Signed)
Pt needs muscle relaxer

## 2017-06-28 ENCOUNTER — Telehealth (INDEPENDENT_AMBULATORY_CARE_PROVIDER_SITE_OTHER): Payer: Self-pay | Admitting: Orthopaedic Surgery

## 2017-06-28 NOTE — Telephone Encounter (Signed)
Roberta Bryant from [place] at [facility] called asking for verbal approval for PT 1 week 1, 3 week 4, 2 week 4,and 1 week 1. Also wanted to get a home health aid 2 week 5 and 1 week 3. CB # [phone]

## 2017-06-28 NOTE — Telephone Encounter (Signed)
verbal order left on VM

## 2017-06-30 ENCOUNTER — Ambulatory Visit (INDEPENDENT_AMBULATORY_CARE_PROVIDER_SITE_OTHER): Payer: Medicare Other | Admitting: Orthopaedic Surgery

## 2017-06-30 ENCOUNTER — Encounter (INDEPENDENT_AMBULATORY_CARE_PROVIDER_SITE_OTHER): Payer: Self-pay | Admitting: Orthopaedic Surgery

## 2017-06-30 DIAGNOSIS — Z9889 Other specified postprocedural states: Secondary | ICD-10-CM

## 2017-06-30 MED ORDER — HYDROCODONE-ACETAMINOPHEN 5-325 MG PO TABS
1.0000 | ORAL_TABLET | Freq: Four times a day (QID) | ORAL | 0 refills | Status: DC | PRN
Start: 2017-06-30 — End: 2017-09-14

## 2017-06-30 NOTE — Telephone Encounter (Signed)
Pt's son called to advise the surgeon is now referring her for shoulder replacement. He also said she started PT this week. He is not sure when she will start OT.  FYI

## 2017-06-30 NOTE — Progress Notes (Signed)
Patient is returning for a week after a right shoulder arthroscopy will be found extensive tearing of the rotator cuff and severe and profound end-stage arthritis of the glenohumeral joint.  She is going to home therapy.  She does ambulate with a walker on occasion.  Her pain is still quite severe.  On exam her shoulder function is limited in terms of abduction and rotation but this is how she was preoperative as well.  I removed the sutures without any difficulty.  There is no evidence of infection.  At this point I would like to at least have a colleague evaluate her right shoulder in terms of the possibility of a reverse total shoulder arthroplasty.  She understands that this may not be possible depending on her health status with being on dialysis although the dialysis access is on her other arm.  But still having an evaluation by someone who does these types of surgeries would be worthwhile and she agrees with this.  We will work on setting that appointment up with my partner Dr. Marlou Sa

## 2017-07-02 ENCOUNTER — Telehealth (INDEPENDENT_AMBULATORY_CARE_PROVIDER_SITE_OTHER): Payer: Self-pay | Admitting: Orthopedic Surgery

## 2017-07-02 NOTE — Telephone Encounter (Signed)
A physical therapist with [facility] called needing orders to use a TENS unit for pain management as well as range of motion both passively and actively for this patient. Please advise # [phone]

## 2017-07-05 NOTE — Telephone Encounter (Signed)
Verbal order left on VM  

## 2017-07-05 NOTE — Telephone Encounter (Signed)
Please advise 

## 2017-07-05 NOTE — Telephone Encounter (Signed)
OK FOR TENS UNIT AND PASSIVE SHOULDER MOTION

## 2017-07-05 NOTE — Telephone Encounter (Signed)
Looks like Dr Ninfa Linden recently did surgery.

## 2017-07-12 ENCOUNTER — Ambulatory Visit (INDEPENDENT_AMBULATORY_CARE_PROVIDER_SITE_OTHER): Payer: Medicare Other | Admitting: Orthopedic Surgery

## 2017-07-14 ENCOUNTER — Ambulatory Visit: Payer: Medicare Other | Admitting: Neurology

## 2017-07-14 ENCOUNTER — Ambulatory Visit: Payer: Medicare Other | Admitting: Cardiology

## 2017-07-21 ENCOUNTER — Other Ambulatory Visit: Payer: Self-pay | Admitting: Orthopedic Surgery

## 2017-07-21 ENCOUNTER — Ambulatory Visit: Payer: Medicare Other | Admitting: Cardiology

## 2017-07-21 ENCOUNTER — Telehealth (INDEPENDENT_AMBULATORY_CARE_PROVIDER_SITE_OTHER): Payer: Self-pay | Admitting: Orthopaedic Surgery

## 2017-07-21 NOTE — Telephone Encounter (Signed)
FYI

## 2017-07-21 NOTE — Telephone Encounter (Signed)
Roselyn Reef -CNA with [facility] called advised patient stopped visits because she is not feeling well. The number to contact Roselyn Reef is [phone]

## 2017-07-23 ENCOUNTER — Ambulatory Visit (INDEPENDENT_AMBULATORY_CARE_PROVIDER_SITE_OTHER): Payer: Medicare Other | Admitting: Cardiology

## 2017-07-23 ENCOUNTER — Encounter: Payer: Self-pay | Admitting: Cardiology

## 2017-07-23 VITALS — BP 100/60 | HR 60 | Resp 10 | Ht 59.0 in | Wt 188.0 lb

## 2017-07-23 DIAGNOSIS — I2510 Atherosclerotic heart disease of native coronary artery without angina pectoris: Secondary | ICD-10-CM

## 2017-07-23 DIAGNOSIS — T82898D Other specified complication of vascular prosthetic devices, implants and grafts, subsequent encounter: Secondary | ICD-10-CM

## 2017-07-23 DIAGNOSIS — I953 Hypotension of hemodialysis: Secondary | ICD-10-CM

## 2017-07-23 DIAGNOSIS — I350 Nonrheumatic aortic (valve) stenosis: Secondary | ICD-10-CM

## 2017-07-23 NOTE — Progress Notes (Signed)
Cardiology Office Note:    Date:  07/23/2017   ID:  [name], DOB 18-Jul-1950, MRN [id number]  PCP:  Al[facility] Puls [facility]  Cardiologist:  [name], MD    Referring MD: Valaria Good, PA-C   Chief Complaint  Patient presents with  . Follow-up  I am not doing well  History of Present Illness:    Roberta Bryant is a 67 y.o. female with multiple medical problems.  She does have coronary artery disease and PTCA and stenting drug-eluting stent to left main coronary artery in 2017.  Complaint of being weak tired exhausted having a lot of pain she did have shoulder surgery already but no relief at all she did have a carpal tunnel syndrome surgery but still complaining of having a lot of pain.  I think the issue is much deeper I suspect she is significantly depressed.  Actually she cries in my office complaining that she is feeling lonely she does have anybody to talk to.  Likely she does not have any suicidal ideas but she told me that she would rather be dead than living that way.  We talked about potentially seeing a psychiatrist however she refused she said they will think I am crazy.  We will talk about potentially having psychotherapy evaluation.  She rejected this offer as well.  Her son is with her in the room.  I talked to her about potentially joining a church and having some support there however she was not too keen about that.  We will contact dialysis center try to find out what kind of social support they have their I think she can be helped by psychotherapy and may be some medications for depression.  She reports to have low blood pressure all the time and feeling poorly.  We do have aortic stenosis which is borderline significant.  I will ask you to have another echocardiogram to make sure we do not have critical aortic stenosis.  Another purpose of this visit is to talk about hand surgery.  She is supposed to have surgery done for trigger finger in regional  anesthesia that should be no problem from my standpoint to be to proceed with surgery as scheduled.  Otherwise appears to be doing fine.  Described to have chest pain on the left side like always it looks like the pain is in the area of her vein being enlarged because of his lites always tender on palpation.  Past Medical History:  Diagnosis Date  . Anemia   . Aortic stenosis   . CHF (congestive heart failure) ([place])   . Childhood asthma   . Chronic upper back pain   . Coronary artery disease    Stent to left main coronary artery second of August 2017  . ESRD (end stage renal disease) on dialysis ([place])    "TTS; [facility] in [place]" (06/22/2017)  . Gait abnormality 08/28/2016  . GERD (gastroesophageal reflux disease)   . Headache    "a few times/week" (10/30/2014)  . Headache syndrome 01/08/2017  . History of blood transfusion 1996   "related to menses"  . Hyperlipidemia   . Hypertension   . Hypothyroidism   . IBS (irritable bowel syndrome)   . Myocardial infarction ([place]) 2004  . Osteoarthritis   . Pneumonia    "years ago"  . Spondylosis of cervical spine 06/16/2017  . Stroke ([place]) 07/2016   mini stroke , right side of body weak  . Type II diabetes mellitus ([place])  Past Surgical History:  Procedure Laterality Date  . APPENDECTOMY    . AV FISTULA PLACEMENT Left 12/03/2006   "forearm"  . AV FISTULA REPAIR Left 11/25/2009   "forearm"  . CARPAL TUNNEL RELEASE Right 02/11/2017   Procedure: RIGHT CARPAL TUNNEL RELEASE;  Surgeon: Daryll Brod, MD;  Location: [facility];  Service: Orthopedics;  Laterality: Right;  . CHOLECYSTECTOMY OPEN  1970's  . COLONOSCOPY W/ POLYPECTOMY    . CORONARY ANGIOPLASTY    . CORONARY ANGIOPLASTY WITH STENT PLACEMENT    . HERNIA REPAIR    . INSERTION OF DIALYSIS CATHETER Right 2010   "chest"  . PARATHYROIDECTOMY  2010?   Parathyroid autotransplantation   . REVISON OF ARTERIOVENOUS FISTULA Left 12/04/2013   Procedure: REVISON OF  ARTERIOVENOUS FISTULA;  Surgeon: Rosetta Posner, MD;  Location: [place] [facility];  Service: Vascular;  Laterality: Left;  . REVISON OF ARTERIOVENOUS FISTULA Left 09/10/3644   Procedure: PLICATION OF LEFT BRACHIOCEPHALIC  ARTERIOVENOUS FISTULA;  Surgeon: Conrad Sonoma, MD;  Location: [facility];  Service: Vascular;  Laterality: Left;  . SHOULDER ARTHROSCOPY W/ ROTATOR CUFF REPAIR Right 06/22/2017   WITH EXTENSIVE DEBRIDEMENT  . SHOULDER ARTHROSCOPY WITH ROTATOR CUFF REPAIR AND SUBACROMIAL DECOMPRESSION Right 06/22/2017   Procedure: RIGHT SHOULDER ARTHROSCOPY WITH EXTENSIVE DEBRIDEMENT;  Surgeon: Mcarthur Rossetti, MD;  Location: [facility];  Service: Orthopedics;  Laterality: Right;  . TUBAL LIGATION  1989  . UMBILICAL HERNIA REPAIR  1990's X 2    Current Medications: Current Meds  Medication Sig  . acetaminophen (TYLENOL) 325 MG tablet Take 650 mg by mouth every 6 (six) hours as needed for moderate pain or headache.   Marland Kitchen aspirin EC 81 MG tablet Take 81 mg by mouth every morning.  . carvedilol (COREG) 12.5 MG tablet Take 25 mg by mouth at bedtime.   . Cholecalciferol (VITAMIN D3) 50000 units CAPS Take 50,000 Units by mouth every 30 (thirty) days.  . clonazePAM (KLONOPIN) 0.5 MG tablet Take 1 tablet (0.5 mg total) by mouth at bedtime.  . docusate sodium (COLACE) 100 MG capsule Take 100 mg by mouth 2 (two) times daily.  Marland Kitchen ezetimibe (ZETIA) 10 MG tablet Take 10 mg by mouth daily.  . Fenofibrate 120 MG TABS Take 1 tablet (120 mg total) by mouth daily.  . fenofibrate 160 MG tablet Take 160 mg by mouth daily.  . finasteride (PROSCAR) 5 MG tablet Take 2.5 mg by mouth daily.  Marland Kitchen HYDROcodone-acetaminophen (NORCO) 5-325 MG tablet Take 1-2 tablets by mouth every 6 (six) hours as needed.  Marland Kitchen lanthanum (FOSRENOL) 1000 MG chewable tablet Chew 1,000 mg by mouth 3 (three) times daily after meals.  Marland Kitchen levothyroxine (SYNTHROID, LEVOTHROID) 50 MCG tablet Take 50 mcg by mouth daily before breakfast.   . liraglutide (VICTOZA) 18  MG/3ML SOPN Inject 1.8 mg into the skin daily.   Marland Kitchen loperamide (IMODIUM A-D) 2 MG tablet Take 2 mg by mouth 4 (four) times daily as needed for diarrhea or loose stools.  . meclizine (ANTIVERT) 25 MG tablet Take 50 mg by mouth daily as needed for dizziness.   Marland Kitchen omeprazole (PRILOSEC) 20 MG capsule Take 20 mg by mouth 2 (two) times daily before a meal.  . sitaGLIPtin (JANUVIA) 25 MG tablet Take 25 mg by mouth daily.  Marland Kitchen tiZANidine (ZANAFLEX) 2 MG tablet Take 1 tablet (2 mg total) by mouth every 8 (eight) hours as needed for muscle spasms.  . Zinc 50 MG CAPS Take 50 mg by mouth every morning.  Allergies:   Sulfa antibiotics; Amoxicillin; Crestor [rosuvastatin calcium]; Ibuprofen; Naldecon senior [guaifenesin]; Pravastatin; Statins; Chlorphen-phenyleph-asa; Gabapentin; and Rosuvastatin   Social History   Socioeconomic History  . Marital status: Divorced    Spouse name: None  . Number of children: 4  . Years of education: Some college  . Highest education level: None  Social Needs  . Financial resource strain: None  . Food insecurity - worry: None  . Food insecurity - inability: None  . Transportation needs - medical: None  . Transportation needs - non-medical: None  Occupational History  . Occupation: Disabled  Tobacco Use  . Smoking status: Former Smoker    Years: 3.00    Types: Cigarettes  . Smokeless tobacco: Never Used  . Tobacco comment: "stopped smoking in the 1970's"  Substance and Sexual Activity  . Alcohol use: No  . Drug use: Yes    Types: Marijuana, Hydromorphone    Comment: as a teenager  . Sexual activity: Not Currently  Other Topics Concern  . None  Social History Narrative   Lives at home with sons   Caffeine use: Coffee-decaf   Right-handed     Family History: The patient's family history includes Alcohol abuse in her father; Cancer in her mother; Diabetes in her mother; Epilepsy in her mother. ROS:   Please see the history of present illness.    All 14  point review of systems negative except as described per history of present illness  EKGs/Labs/Other Studies Reviewed:      Recent Labs: 05/28/2017: ALT 15 06/18/2017: BUN 28; Creatinine, Ser 6.14; Platelets 185 06/22/2017: Hemoglobin 11.9; Potassium 4.2; Sodium 137  Recent Lipid Panel    Component Value Date/Time   CHOL 188 12/23/2016 0912   TRIG 218 (H) 12/23/2016 0912   HDL 44 12/23/2016 0912   CHOLHDL 4.3 12/23/2016 0912   CHOLHDL 8.4 11/24/2015 0311   VLDL UNABLE TO CALCULATE IF TRIGLYCERIDE OVER 400 mg/dL 11/24/2015 0311   LDLCALC 100 (H) 12/23/2016 0912    Physical Exam:    VS:  BP 100/60   Pulse 60   Resp 10   Ht 4\' 11"  (1.499 m)   Wt 188 lb (85.3 kg)   BMI 37.97 kg/m     Wt Readings from Last 3 Encounters:  07/23/17 188 lb (85.3 kg)  06/22/17 203 lb 4.8 oz (92.2 kg)  06/18/17 196 lb 12.8 oz (89.3 kg)     GEN:  Well nourished, well developed in no acute distress HEENT: Normal NECK: No JVD; No carotid bruits LYMPHATICS: No lymphadenopathy CARDIAC: RRR, systolic ejection murmur grade 3 with radiation towards the neck somewhat late peaking but S2 is still present, no rubs, no gallops RESPIRATORY:  Clear to auscultation without rales, wheezing or rhonchi  ABDOMEN: Soft, non-tender, non-distended MUSCULOSKELETAL:  No edema; No deformity  SKIN: Warm and dry LOWER EXTREMITIES: no swelling NEUROLOGIC:  Alert and oriented x 3 PSYCHIATRIC:  Normal affect   ASSESSMENT:    1. Aneurysm of arteriovenous dialysis fistula, subsequent encounter   2. Nonrheumatic aortic valve stenosis   3. Coronary artery disease involving native coronary artery of native heart without angina pectoris   4. Hemodialysis-associated hypotension    PLAN:    In order of problems listed above:  1. Aortic stenosis: I will ask her to have another echocardiogram to check for degree of aortic stenosis. 2. Coronary artery disease: Stable denies having any new problems at the  moment. 3. Depression: I suspect this is significant problem and  I will contact dialysis center to see how we can help this lady.  Her primary care physician has been recently changed she see somebody new right now that she does not trust yet. 4. Hemodialysis related hypotension.  We will continue present management   Medication Adjustments/Labs and Tests Ordered: Current medicines are reviewed at length with the patient today.  Concerns regarding medicines are outlined above.  Orders Placed This Encounter  Procedures  . ECHOCARDIOGRAM COMPLETE   Medication changes: No orders of the defined types were placed in this encounter.   Signed, Park Liter, MD, [facility] 07/23/2017 10:56 AM    [place]

## 2017-07-23 NOTE — Patient Instructions (Signed)
Medication Instructions:  Your physician recommends that you continue on your current medications as directed. Please refer to the Current Medication list given to you today.  Labwork: None ordered  Testing/Procedures: Your physician has requested that you have an echocardiogram. Echocardiography is a painless test that uses sound waves to create images of your heart. It provides your doctor with information about the size and shape of your heart and how well your heart's chambers and valves are working. This procedure takes approximately one hour. There are no restrictions for this procedure.  Follow-Up: Your physician recommends that you schedule a follow-up appointment in: 3 months with Dr. Krasowski  Any Other Special Instructions Will Be Listed Below (If Applicable).     If you need a refill on your cardiac medications before your next appointment, please call your pharmacy.   

## 2017-07-26 ENCOUNTER — Encounter (INDEPENDENT_AMBULATORY_CARE_PROVIDER_SITE_OTHER): Payer: Self-pay | Admitting: Orthopedic Surgery

## 2017-07-26 ENCOUNTER — Ambulatory Visit (INDEPENDENT_AMBULATORY_CARE_PROVIDER_SITE_OTHER): Payer: Medicare Other | Admitting: Orthopedic Surgery

## 2017-07-26 DIAGNOSIS — G8929 Other chronic pain: Secondary | ICD-10-CM | POA: Diagnosis not present

## 2017-07-26 DIAGNOSIS — I2510 Atherosclerotic heart disease of native coronary artery without angina pectoris: Secondary | ICD-10-CM | POA: Diagnosis not present

## 2017-07-26 DIAGNOSIS — M12811 Other specific arthropathies, not elsewhere classified, right shoulder: Secondary | ICD-10-CM

## 2017-07-26 DIAGNOSIS — M25511 Pain in right shoulder: Secondary | ICD-10-CM

## 2017-07-28 ENCOUNTER — Encounter (INDEPENDENT_AMBULATORY_CARE_PROVIDER_SITE_OTHER): Payer: Self-pay | Admitting: Orthopedic Surgery

## 2017-07-28 NOTE — Progress Notes (Signed)
Office Visit Note   Patient: Roberta Bryant           Date of Birth: 1951-03-13           MRN: [id number] Visit Date: 07/26/2017 Requested by: [facility] [address], [place] [address] PCP: Alanson Puls, [facility]  Subjective: Chief Complaint  Patient presents with  . Right Shoulder - Routine Post Op    HPI: Patient presents for evaluation of severe right shoulder pain.  She had arthroscopy and debridement 06/22/2017.  She has had significant pain since that time.  She has not had fevers and chills.  But discovered at that time was irreparable rotator cuff tear.  She is retired and she is on dialysis.  She has a fistula in the left arm.  Hemoglobin A1c runs below 5.  Patient also describes a history of what sounds like aortic stenosis..  She is retired.  She has to have help to get up.  She is in a wheelchair.  This is somewhat of a weightbearing shoulder.              ROS: All systems reviewed are negative as they relate to the chief complaint within the history of present illness.  Patient denies  fevers or chills.   Assessment & Plan: Visit Diagnoses:  1. Rotator cuff arthropathy of right shoulder   2. Chronic right shoulder pain     Plan: Impression is right shoulder rotator cuff arthropathy in a patient who has severe pain and is at relatively high risk of complication due to her diabetes and end-stage renal disease.  Nonetheless she states she really cannot live with what she has at this time.  She has a lot of pain with any range of motion.  She needs cardiac risk stratification particularly in regards to the aortic stenosis.  Thin cut CT scan as a preoperative planning tool for reverse shoulder replacement.  Risk and benefits are discussed including but not limited to infection incomplete pain relief as well as dislocation.  All questions answered.  Follow-Up Instructions: No Follow-up on file.   Orders:  Orders Placed This Encounter   Procedures  . CT SHOULDER RIGHT WO CONTRAST   No orders of the defined types were placed in this encounter.     Procedures: No procedures performed   Clinical Data: No additional findings.  Objective: Vital Signs: There were no vitals taken for this visit.  Physical Exam:   Constitutional: Patient appears well-developed HEENT:  Head: Normocephalic Eyes:EOM are normal Neck: Normal range of motion Cardiovascular: Normal rate Pulmonary/chest: Effort normal Neurologic: Patient is alert Skin: Skin is warm Psychiatric: Patient has normal mood and affect    Ortho Exam: Orthopedic exam demonstrates no warmth around the right shoulder.  Deltoid fires.  Any other range of motion is particularly painful for the patient.  Motor sensory function to the hand is intact.  Arthroscopic radiographic pictures are reviewed and she does have an irreparable rotator cuff tear.  Specialty Comments:  No specialty comments available.  Imaging: No results found.   PMFS History: Patient Active Problem List   Diagnosis Date Noted  . Status post arthroscopy of right shoulder 06/22/2017  . Spondylosis of cervical spine 06/16/2017  . Aortic stenosis 04/12/2017  . Coronary artery diseaseStatus post PTCA and stenting with drug-eluting stent to left main coronary artery in the summer 2017 04/12/2017  . 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 Headache syndrome 01/08/2017  . Right carpal tunnel  syndrome 12/14/2016  . Complete tear of right rotator cuff 10/21/2016  . Gait abnormality 08/28/2016  . Chest pain 11/23/2015  . ESRD on dialysis ([place])   . Type 2 diabetes mellitus with complication ([place])   . Essential hypertension   . Abnormal ECG   . Pain in the chest   . Hypotension 10/30/2014  . Abnormal EKG 10/30/2014  . 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 End stage renal disease ([place]) 07/18/2012  . Other complications due to renal dialysis device, implant, and graft 07/18/2012    Past Medical History:  Diagnosis Date  . Anemia   . Aortic stenosis   . CHF (congestive heart failure) ([place])   . Childhood asthma   . Chronic upper back pain   . Coronary artery disease    Stent to left main coronary artery second of August 2017  . ESRD (end stage renal disease) on dialysis ([place])    "TTS; [facility] in [place]" (06/22/2017)  . Gait abnormality 08/28/2016  . GERD (gastroesophageal reflux disease)   . Headache    "a few times/week" (10/30/2014)  . Headache syndrome 01/08/2017  . History of blood transfusion 1996   "related to menses"  . Hyperlipidemia   . Hypertension   . Hypothyroidism   . IBS (irritable bowel syndrome)   . Myocardial infarction ([place]) 2004  . Osteoarthritis   . Pneumonia    "years ago"  . Spondylosis of cervical spine 06/16/2017  . Stroke ([place]) 07/2016   mini stroke , right side of body weak  . Type II diabetes mellitus ([facility])     Family History  Problem Relation Age of Onset  . Epilepsy Mother   . Diabetes Mother   . Cancer Mother   . Alcohol abuse Father     Past Surgical History:  Procedure Laterality Date  . APPENDECTOMY    . AV FISTULA PLACEMENT Left 12/03/2006   "forearm"  . AV FISTULA REPAIR Left 11/25/2009   "forearm"  . CARPAL TUNNEL RELEASE Right 02/11/2017   Procedure: RIGHT CARPAL TUNNEL RELEASE;  Surgeon: Daryll Brod, MD;  Location: [facility];  Service: Orthopedics;  Laterality: Right;  . CHOLECYSTECTOMY OPEN  1970's  . COLONOSCOPY W/ POLYPECTOMY    . CORONARY ANGIOPLASTY    . CORONARY ANGIOPLASTY WITH STENT PLACEMENT    . HERNIA REPAIR    . INSERTION OF DIALYSIS CATHETER Right 2010   "chest"  . PARATHYROIDECTOMY  2010?   Parathyroid autotransplantation   . REVISON OF ARTERIOVENOUS FISTULA Left 12/04/2013   Procedure: REVISON OF ARTERIOVENOUS FISTULA;  Surgeon: Rosetta Posner, MD;  Location: [facility];  Service: Vascular;  Laterality: Left;  . REVISON OF ARTERIOVENOUS FISTULA Left 10/11/2561   Procedure:  PLICATION OF LEFT BRACHIOCEPHALIC  ARTERIOVENOUS FISTULA;  Surgeon: Conrad Green, MD;  Location: [facility];  Service: Vascular;  Laterality: Left;  . SHOULDER ARTHROSCOPY W/ ROTATOR CUFF REPAIR Right 06/22/2017   WITH EXTENSIVE DEBRIDEMENT  . SHOULDER ARTHROSCOPY WITH ROTATOR CUFF REPAIR AND SUBACROMIAL DECOMPRESSION Right 06/22/2017   Procedure: RIGHT SHOULDER ARTHROSCOPY WITH EXTENSIVE DEBRIDEMENT;  Surgeon: Mcarthur Rossetti, MD;  Location: [facility];  Service: Orthopedics;  Laterality: Right;  . TUBAL LIGATION  1989  . UMBILICAL HERNIA REPAIR  1990's X 2   Social History   Occupational History  . Occupation: Disabled  Tobacco Use  . Smoking status: Former Smoker    Years: 3.00    Types: Cigarettes  . Smokeless tobacco: Never Used  .  Tobacco comment: "stopped smoking in the 1970's"  Substance and Sexual Activity  . Alcohol use: No  . Drug use: Yes    Types: Marijuana, Hydromorphone    Comment: as a teenager  . Sexual activity: Not Currently

## 2017-08-09 ENCOUNTER — Ambulatory Visit
Admission: RE | Admit: 2017-08-09 | Discharge: 2017-08-09 | Disposition: A | Discharge status: Home / Self Care | Payer: Medicare Other | Source: Ambulatory Visit | Attending: Orthopedic Surgery | Admitting: Orthopedic Surgery

## 2017-08-09 DIAGNOSIS — M25511 Pain in right shoulder: Principal | ICD-10-CM

## 2017-08-09 DIAGNOSIS — G8929 Other chronic pain: Secondary | ICD-10-CM

## 2017-08-11 NOTE — Progress Notes (Signed)
Please call patient with results. Thanks CT scan confirms arthritis and rotator cuff arthropathy.  Once cardiac risk stratification has occurred we can schedule for surgery

## 2017-08-12 ENCOUNTER — Encounter (HOSPITAL_BASED_OUTPATIENT_CLINIC_OR_DEPARTMENT_OTHER): Payer: Self-pay

## 2017-08-12 ENCOUNTER — Ambulatory Visit (HOSPITAL_BASED_OUTPATIENT_CLINIC_OR_DEPARTMENT_OTHER): Admit: 2017-08-12 | Payer: Medicare Other | Admitting: Orthopedic Surgery

## 2017-08-12 SURGERY — RELEASE, A1 PULLEY, FOR TRIGGER FINGER
Anesthesia: Regional | Laterality: Right

## 2017-08-13 ENCOUNTER — Encounter (INDEPENDENT_AMBULATORY_CARE_PROVIDER_SITE_OTHER): Payer: Self-pay | Admitting: Orthopedic Surgery

## 2017-08-13 ENCOUNTER — Ambulatory Visit (INDEPENDENT_AMBULATORY_CARE_PROVIDER_SITE_OTHER): Payer: Medicare Other | Admitting: Orthopedic Surgery

## 2017-08-13 DIAGNOSIS — I2510 Atherosclerotic heart disease of native coronary artery without angina pectoris: Secondary | ICD-10-CM

## 2017-08-13 DIAGNOSIS — M12811 Other specific arthropathies, not elsewhere classified, right shoulder: Secondary | ICD-10-CM

## 2017-08-13 NOTE — Progress Notes (Signed)
Office Visit Note   Patient: Roberta Bryant           Date of Birth: [birth year]           MRN: [id number] Visit Date: 08/13/2017 Requested by: [facility] [place], [place] [address] PCP: Alanson Puls, [facility]  Subjective: Chief Complaint  Patient presents with  . Right Shoulder - Follow-up    HPI: Shomari is a 67 year old patient with right shoulder rotator cuff arthropathy.  She had to have her hand surgery canceled due to her heart issues.  Shoulder surgery would be more intense than hand surgery.  She is taking oxycodone.  She is diabetic and also in dialysis.  She has rotator cuff arthropathy and underwent surgery earlier this year with debridement and she has had a very rocky course since that time.  Essentially she has a frozen shoulder at this time but CT scan also shows significant arthritis and rotator cuff arthropathy with atrophy of the rotator cuff muscles.  She is here with her son today.              ROS: All systems reviewed are negative as they relate to the chief complaint within the history of present illness.  Patient denies  fevers or chills. Impression is rotator cuff arthropathy and a high risk patient who does have  Assessment & Plan: Visit Diagnoses:  1. Rotator cuff arthropathy of right shoulder     Plan: What sounds like aortic stenosis based on the murmur.  She is seeing her cardiologist in a week or 2 for echocardiogram.  I think once we obtain risk stratification from the cardiologist we can proceed with surgery.  I did discuss the risk and benefits of surgery including but not limited to infection nerve vessel damage dislocation possible stroke heart attack as well as other complications which can occur from the stress of surgery.  Patient understands the risk and benefits and wishes to proceed once cardiac risk stratification has occurred.  Her quality of life now is such that she is willing to undergo the risk  of the surgery in order to achieve some pain relief.  Follow-Up Instructions: Return if symptoms worsen or fail to improve.   Orders:  No orders of the defined types were placed in this encounter.  No orders of the defined types were placed in this encounter.     Procedures: No procedures performed   Clinical Data: No additional findings.  Objective: Vital Signs: There were no vitals taken for this visit.  Physical Exam:   Constitutional: Patient appears well-developed HEENT:  Head: Normocephalic Eyes:EOM are normal Neck: Normal range of motion Cardiovascular: Normal rate Pulmonary/chest: Effort normal Neurologic: Patient is alert Skin: Skin is warm Psychiatric: Patient has normal mood and affect    Ortho Exam: Orthopedic exam demonstrates pretty painful range of motion of that right arm.  I do not detect any significant warmth.  Deltoid does fire but motion is really limited.  She does have a fairly significant aortic ejection/stenosis type murmur.  Motor sensory function to the hand is intact.  Specialty Comments:  No specialty comments available.  Imaging: No results found.   PMFS History: Patient Active Problem List   Diagnosis Date Noted  . Status post arthroscopy of right shoulder 06/22/2017  . Spondylosis of cervical spine 06/16/2017  . Aortic stenosis 04/12/2017  . Coronary artery diseaseStatus post PTCA and stenting with drug-eluting stent to left main coronary artery in  the summer 2017 04/12/2017  . 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 Headache syndrome 01/08/2017  . Right carpal tunnel syndrome 12/14/2016  . Complete tear of right rotator cuff 10/21/2016  . Gait abnormality 08/28/2016  . Chest pain 11/23/2015  . 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 Type 2 diabetes mellitus with complication ([facility])   . Essential hypertension   . Abnormal ECG   . Pain in the chest   . Hypotension 10/30/2014  . Abnormal EKG 10/30/2014  . 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 End stage renal disease ([place]) 07/18/2012  . Other complications due to renal dialysis device, implant, and graft 07/18/2012   Past Medical History:  Diagnosis Date  . Anemia   . Aortic stenosis   . CHF (congestive heart failure) ([facility])   . Childhood asthma   . Chronic upper back pain   . Coronary artery disease    Stent to left main coronary artery second of August 2017  . ESRD (end stage renal disease) on dialysis ([place] [facility])    "TTS; [facility] in [place]" (06/22/2017)  . Gait abnormality 08/28/2016  . GERD (gastroesophageal reflux disease)   . Headache    "a few times/week" (10/30/2014)  . Headache syndrome 01/08/2017  . History of blood transfusion 1996   "related to menses"  . Hyperlipidemia   . Hypertension   . Hypothyroidism   . IBS (irritable bowel syndrome)   . Myocardial infarction ([facility]) 2004  . Osteoarthritis   . Pneumonia    "years ago"  . Spondylosis of cervical spine 06/16/2017  . Stroke ([facility]) 07/2016   mini stroke , right side of body weak  . Type II diabetes mellitus (HCC)     Family History  Problem Relation Age of Onset  . Epilepsy Mother   . Diabetes Mother   . Cancer Mother   . Alcohol abuse Father     Past Surgical History:  Procedure Laterality Date  . APPENDECTOMY    . AV FISTULA PLACEMENT Left 12/03/2006   "forearm"  . AV FISTULA REPAIR Left 11/25/2009   "forearm"  . CARPAL TUNNEL RELEASE Right 02/11/2017   Procedure: RIGHT CARPAL TUNNEL RELEASE;  Surgeon: Daryll Brod, MD;  Location: [facility];  Service: Orthopedics;  Laterality: Right;  . CHOLECYSTECTOMY OPEN  1970's  . COLONOSCOPY W/ POLYPECTOMY    . CORONARY ANGIOPLASTY    . CORONARY ANGIOPLASTY WITH STENT PLACEMENT    . HERNIA REPAIR    . INSERTION OF DIALYSIS CATHETER Right 2010   "chest"  . PARATHYROIDECTOMY  2010?   Parathyroid autotransplantation   . REVISON OF ARTERIOVENOUS FISTULA Left 12/04/2013   Procedure: REVISON OF ARTERIOVENOUS FISTULA;  Surgeon:  Rosetta Posner, MD;  Location: [facility];  Service: Vascular;  Laterality: Left;  . REVISON OF ARTERIOVENOUS FISTULA Left 10/08/8411   Procedure: PLICATION OF LEFT BRACHIOCEPHALIC  ARTERIOVENOUS FISTULA;  Surgeon: Conrad Segundo, MD;  Location: [facility];  Service: Vascular;  Laterality: Left;  . SHOULDER ARTHROSCOPY W/ ROTATOR CUFF REPAIR Right 06/22/2017   WITH EXTENSIVE DEBRIDEMENT  . SHOULDER ARTHROSCOPY WITH ROTATOR CUFF REPAIR AND SUBACROMIAL DECOMPRESSION Right 06/22/2017   Procedure: RIGHT SHOULDER ARTHROSCOPY WITH EXTENSIVE DEBRIDEMENT;  Surgeon: Mcarthur Rossetti, MD;  Location: [facility];  Service: Orthopedics;  Laterality: Right;  . TUBAL LIGATION  1989  . UMBILICAL HERNIA REPAIR  1990's X 2   Social History   Occupational History  . Occupation: Disabled  Tobacco Use  . Smoking status: Former Smoker  Years: 3.00    Types: Cigarettes  . Smokeless tobacco: Never Used  . Tobacco comment: "stopped smoking in the 1970's"  Substance and Sexual Activity  . Alcohol use: No  . Drug use: Yes    Types: Marijuana, Hydromorphone    Comment: as a teenager  . Sexual activity: Not Currently

## 2017-08-18 ENCOUNTER — Ambulatory Visit (HOSPITAL_BASED_OUTPATIENT_CLINIC_OR_DEPARTMENT_OTHER)
Admission: RE | Admit: 2017-08-18 | Discharge: 2017-08-18 | Disposition: A | Discharge status: Home / Self Care | Payer: Medicare Other | Source: Ambulatory Visit | Attending: Cardiology | Admitting: Cardiology

## 2017-08-18 DIAGNOSIS — T82898D Other specified complication of vascular prosthetic devices, implants and grafts, subsequent encounter: Secondary | ICD-10-CM | POA: Diagnosis not present

## 2017-08-18 DIAGNOSIS — X58XXXD Exposure to other specified factors, subsequent encounter: Secondary | ICD-10-CM | POA: Insufficient documentation

## 2017-08-18 DIAGNOSIS — I10 Essential (primary) hypertension: Secondary | ICD-10-CM | POA: Insufficient documentation

## 2017-08-18 DIAGNOSIS — I350 Nonrheumatic aortic (valve) stenosis: Secondary | ICD-10-CM

## 2017-08-18 DIAGNOSIS — I352 Nonrheumatic aortic (valve) stenosis with insufficiency: Secondary | ICD-10-CM | POA: Diagnosis not present

## 2017-08-18 DIAGNOSIS — I2510 Atherosclerotic heart disease of native coronary artery without angina pectoris: Secondary | ICD-10-CM | POA: Diagnosis not present

## 2017-08-18 NOTE — Progress Notes (Signed)
Echocardiogram 2D Echocardiogram has been performed.  Roberta Bryant 08/18/2017, 10:37 AM

## 2017-08-23 ENCOUNTER — Other Ambulatory Visit: Payer: Self-pay | Admitting: Neurology

## 2017-08-30 ENCOUNTER — Encounter: Payer: Self-pay | Admitting: *Deleted

## 2017-08-30 ENCOUNTER — Telehealth: Payer: Self-pay | Admitting: Cardiology

## 2017-08-30 DIAGNOSIS — I2510 Atherosclerotic heart disease of native coronary artery without angina pectoris: Secondary | ICD-10-CM

## 2017-08-30 DIAGNOSIS — I350 Nonrheumatic aortic (valve) stenosis: Secondary | ICD-10-CM

## 2017-08-30 NOTE — Telephone Encounter (Signed)
Dr. Agustin Cree patient.

## 2017-08-30 NOTE — Telephone Encounter (Signed)
Explained to patient that Dr. Agustin Cree spoke with her surgeon and wants to have a lexiscan nuclear stress test done before giving clearance for surgery. Patient agreed. Advised that she would receive another phone call about details of date, time, and further instructions. Patient verbalized understanding. No further questions.

## 2017-08-30 NOTE — Telephone Encounter (Signed)
Please call patient regarding echo results to surgeon

## 2017-08-31 ENCOUNTER — Encounter: Payer: Self-pay | Admitting: *Deleted

## 2017-08-31 NOTE — Telephone Encounter (Signed)
Patient advised of lexiscan stress test appointment on April 5th, 2019 at 9:15 am at the [facility] office. Reviewed all pre-test instructions with the patient and informed her I sent all instructions in a letter via mail. Advised patient to call me if she had any questions. Patient verbalized understanding. No further questions at this time.

## 2017-08-31 NOTE — Telephone Encounter (Signed)
Left message to return call 

## 2017-09-07 ENCOUNTER — Encounter (HOSPITAL_COMMUNITY): Payer: Medicare Other

## 2017-09-07 ENCOUNTER — Telehealth (HOSPITAL_COMMUNITY): Payer: Self-pay | Admitting: *Deleted

## 2017-09-07 NOTE — Telephone Encounter (Signed)
Left message on voicemail per DPR in reference to upcoming appointment scheduled on 09/10/17 at [facility] with detailed instructions given per Myocardial Perfusion Study Information Sheet for the test. LM to arrive 15 minutes early, and that it is imperative to arrive on time for appointment to keep from having the test rescheduled. If you need to cancel or reschedule your appointment, please call the office within 24 hours of your appointment. Failure to do so may result in a cancellation of your appointment, and a $50 no show fee. Phone number given for call back for any questions.

## 2017-09-10 ENCOUNTER — Encounter (HOSPITAL_COMMUNITY): Payer: Medicare Other

## 2017-09-10 ENCOUNTER — Encounter (INDEPENDENT_AMBULATORY_CARE_PROVIDER_SITE_OTHER): Payer: Self-pay

## 2017-09-10 ENCOUNTER — Ambulatory Visit (HOSPITAL_COMMUNITY): Payer: Medicare Other

## 2017-09-14 ENCOUNTER — Ambulatory Visit (INDEPENDENT_AMBULATORY_CARE_PROVIDER_SITE_OTHER): Payer: Medicare Other | Admitting: Neurology

## 2017-09-14 ENCOUNTER — Encounter: Payer: Self-pay | Admitting: Neurology

## 2017-09-14 VITALS — BP 105/71 | HR 75

## 2017-09-14 DIAGNOSIS — M47812 Spondylosis without myelopathy or radiculopathy, cervical region: Secondary | ICD-10-CM | POA: Diagnosis not present

## 2017-09-14 DIAGNOSIS — I2510 Atherosclerotic heart disease of native coronary artery without angina pectoris: Secondary | ICD-10-CM

## 2017-09-14 MED ORDER — NORTRIPTYLINE HCL 10 MG PO CAPS: ORAL_CAPSULE | ORAL | 3 refills | Status: DC

## 2017-09-14 NOTE — Patient Instructions (Signed)
   We will stop the tizanidine. We will start nortriptyline at night.  Pamelor (nortriptyline) is an antidepressant medication that has many uses that may include headache, whiplash injuries, or for peripheral neuropathy pain. Side effects may include drowsiness, dry mouth, blurred vision, or constipation. As with any antidepressant medication, worsening depression may occur. If you had any significant side effects, please call our office. The full effects of this medication may take 7-10 days after starting the drug, or going up on the dose.

## 2017-09-14 NOTE — Progress Notes (Signed)
Reason for visit: Headache  Roberta Bryant is an 67 y.o. female  History of present illness:  Roberta Bryant is a 68 year old right-handed Hispanic female with a history of what initially was felt to be Parkinson's disease, she had been treated by Dr. Metta Clines on Sinemet, this was eventually withdrawn.  The patient has not had any change in her condition off of the Sinemet.  The patient has had a lot of right shoulder discomfort associated with severe arthritis.  She is felt to have a need for a right shoulder replacement.  The patient is also complaining of neck pain and crepitus in the neck.  She has had some episodes of myoclonus that have improved with clonazepam 0.5 mg at night.  The patient continues to have headaches on a daily basis in the back of the head and on top of the head and in the temporal regions bilaterally.  She may have some photophobia without phonophobia.  The headaches have not improved much with tizanidine.  Previously, she was started on nortriptyline but it was felt that this caused a myoclonus but the episodes of myoclonus have occurred for months after stopping the nortriptyline.  Past Medical History:  Diagnosis Date  . Anemia   . Aortic stenosis   . CHF (congestive heart failure) ([facility])   . Childhood asthma   . Chronic upper back pain   . Coronary artery disease    Stent to left main coronary artery second of August 2017  . ESRD (end stage renal disease) on dialysis ([facility])    "TTS; [facility] in [place]" (06/22/2017)  . Gait abnormality 08/28/2016  . GERD (gastroesophageal reflux disease)   . Headache    "a few times/week" (10/30/2014)  . Headache syndrome 01/08/2017  . History of blood transfusion 1996   "related to menses"  . Hyperlipidemia   . Hypertension   . Hypothyroidism   . IBS (irritable bowel syndrome)   . Myocardial infarction ([facility]) 2004  . Osteoarthritis   . Pneumonia    "years ago"  . Spondylosis of cervical spine 06/16/2017  . Stroke  ([facility]) 07/2016   mini stroke , right side of body weak  . Type II diabetes mellitus ([place])     Past Surgical History:  Procedure Laterality Date  . APPENDECTOMY    . AV FISTULA PLACEMENT Left 12/03/2006   "forearm"  . AV FISTULA REPAIR Left 11/25/2009   "forearm"  . CARPAL TUNNEL RELEASE Right 02/11/2017   Procedure: RIGHT CARPAL TUNNEL RELEASE;  Surgeon: Daryll Brod, MD;  Location: [facility];  Service: Orthopedics;  Laterality: Right;  . CHOLECYSTECTOMY OPEN  1970's  . COLONOSCOPY W/ POLYPECTOMY    . CORONARY ANGIOPLASTY    . CORONARY ANGIOPLASTY WITH STENT PLACEMENT    . HERNIA REPAIR    . INSERTION OF DIALYSIS CATHETER Right 2010   "chest"  . PARATHYROIDECTOMY  2010?   Parathyroid autotransplantation   . REVISON OF ARTERIOVENOUS FISTULA Left 12/04/2013   Procedure: REVISON OF ARTERIOVENOUS FISTULA;  Surgeon: Rosetta Posner, MD;  Location: [facility];  Service: Vascular;  Laterality: Left;  . REVISON OF ARTERIOVENOUS FISTULA Left 08/12/6438   Procedure: PLICATION OF LEFT BRACHIOCEPHALIC  ARTERIOVENOUS FISTULA;  Surgeon: Conrad Wintersburg, MD;  Location: [facility];  Service: Vascular;  Laterality: Left;  . SHOULDER ARTHROSCOPY W/ ROTATOR CUFF REPAIR Right 06/22/2017   WITH EXTENSIVE DEBRIDEMENT  . SHOULDER ARTHROSCOPY WITH ROTATOR CUFF REPAIR AND SUBACROMIAL DECOMPRESSION Right 06/22/2017   Procedure: RIGHT  SHOULDER ARTHROSCOPY WITH EXTENSIVE DEBRIDEMENT;  Surgeon: Mcarthur Rossetti, MD;  Location: [facility];  Service: Orthopedics;  Laterality: Right;  . TUBAL LIGATION  1989  . UMBILICAL HERNIA REPAIR  1990's X 2    Family History  Problem Relation Age of Onset  . Epilepsy Mother   . Diabetes Mother   . Cancer Mother   . Alcohol abuse Father     Social history:  reports that she has quit smoking. Her smoking use included cigarettes. She quit after 3.00 years of use. She has never used smokeless tobacco. She reports that she has current or past drug history. Drugs: Marijuana and  Hydromorphone. She reports that she does not drink alcohol.    Allergies  Allergen Reactions  . Sulfa Antibiotics Anaphylaxis  . Amoxicillin Hives  . Crestor [Rosuvastatin Calcium] Other (See Comments)    Dizziness, couldn't walk  . Ibuprofen Hives  . Naldecon Senior [employer]  . Pravastatin Hives and Other (See Comments)    UNSPECIFIED REACTION   . Statins Hives  . Chlorphen-Phenyleph-Asa Rash  . Gabapentin Other (See Comments)    Dizzy  . Rosuvastatin Other (See Comments)    dizziness    Medications:  Prior to Admission medications   Medication Sig Start Date End Date Taking? Authorizing Provider  acetaminophen (TYLENOL) 325 MG tablet Take 650 mg by mouth every 6 (six) hours as needed for moderate pain or headache.    Yes [provider]  aspirin EC 81 MG tablet Take 81 mg by mouth every morning.   Yes [provider]  carvedilol (COREG) 12.5 MG tablet Take 25 mg by mouth at bedtime.  08/06/16  Yes [provider]  Cholecalciferol (VITAMIN D3) 50000 units CAPS Take 50,000 Units by mouth every 30 (thirty) days.   Yes [provider]  clonazePAM (KLONOPIN) 0.5 MG tablet TAKE 1 TABLET BY MOUTH AT BEDTIME 08/23/17  Yes Kathrynn Ducking, MD  docusate sodium (COLACE) 100 MG capsule Take 100 mg by mouth 2 (two) times daily.   Yes [provider]  ezetimibe (ZETIA) 10 MG tablet Take 10 mg by mouth daily. 05/10/16  Yes [provider]  Fenofibrate 120 MG TABS Take 1 tablet (120 mg total) by mouth daily. 11/11/16  Yes Virgina Jock A, PA-C  fenofibrate 160 MG tablet Take 160 mg by mouth daily. 03/08/17  Yes [provider]  finasteride (PROSCAR) 5 MG tablet Take 2.5 mg by mouth daily.   Yes [provider]  lanthanum (FOSRENOL) 1000 MG chewable tablet Chew 1,000 mg by mouth 3 (three) times daily after meals.   Yes [provider]  levothyroxine (SYNTHROID, LEVOTHROID) 50 MCG tablet Take 50 mcg by mouth  daily before breakfast.    Yes [provider]  liraglutide (VICTOZA) 18 MG/3ML SOPN Inject 1.8 mg into the skin daily.    Yes [provider]  loperamide (IMODIUM A-D) 2 MG tablet Take 2 mg by mouth 4 (four) times daily as needed for diarrhea or loose stools.   Yes [provider]  meclizine (ANTIVERT) 25 MG tablet Take 50 mg by mouth daily as needed for dizziness.    Yes [provider]  omeprazole (PRILOSEC) 20 MG capsule Take 20 mg by mouth 2 (two) times daily before a meal.   Yes [provider]  Oxycodone HCl 10 MG TABS Take 10 mg by mouth. 08/04/17  Yes [provider]  sitaGLIPtin (JANUVIA) 25 MG tablet Take 25 mg by mouth  daily.   Yes [provider]  tiZANidine (ZANAFLEX) 2 MG tablet Take 1 tablet (2 mg total) by mouth every 8 (eight) hours as needed for muscle spasms. 06/25/17  Yes Mcarthur Rossetti, MD  Zinc 50 MG CAPS Take 50 mg by mouth every morning.    Yes [provider]    ROS:  Out of a complete 14 system review of symptoms, the patient complains only of the following symptoms, and all other reviewed systems are negative.  Headache Right shoulder pain Neck pain  Blood pressure 105/71, pulse 75.  Physical Exam  General: The patient is alert and cooperative at the time of the examination.  Patient is moderately to markedly obese.  Skin: No significant peripheral edema is noted.   Neurologic Exam  Mental status: The patient is alert and oriented x 3 at the time of the examination. The patient has apparent normal recent and remote memory, with an apparently normal attention span and concentration ability.   Cranial nerves: Facial symmetry is present. Speech is normal, no aphasia or dysarthria is noted. Extraocular movements are full. Visual fields are full.  Motor: The patient has good strength in all 4 extremities, but the patient has limited mobility across the right shoulder.  Sensory  examination: Soft touch sensation is symmetric on the face, arms, and legs.  Coordination: The patient has good finger-nose-finger and heel-to-shin bilaterally.  Gait and station: The patient was not ambulated, she is wheelchair-bound.  Reflexes: Deep tendon reflexes are symmetric.   Assessment/Plan:  1.  Chronic neck pain, cervical spondylosis  2.  Chronic daily headache  We will once again try the nortriptyline in low-dose working up to 20 mg daily.  The patient has recently been found to aortic stenosis, she is going through a workup for this currently.  The patient will follow-up in this office in about 5 months.   Jill Alexanders MD 09/14/2017 2:45 PM  [facility] [address] [place], [place] [address]  Phone [phone] Fax [phone]

## 2017-09-16 ENCOUNTER — Telehealth (HOSPITAL_COMMUNITY): Payer: Self-pay | Admitting: *Deleted

## 2017-09-16 NOTE — Telephone Encounter (Signed)
Left message on voicemail per DPR in reference to upcoming appointment scheduled on 4/115/19 with detailed instructions given per Myocardial Perfusion Study Information Sheet for the test. LM to arrive 15 minutes early, and that it is imperative to arrive on time for appointment to keep from having the test rescheduled. If you need to cancel or reschedule your appointment, please call the office within 24 hours of your appointment. Failure to do so may result in a cancellation of your appointment, and a $50 no show fee. Phone number given for call back for any questions. Kirstie Peri

## 2017-09-20 ENCOUNTER — Ambulatory Visit (HOSPITAL_COMMUNITY): Payer: Medicare Other | Attending: Cardiology

## 2017-09-20 VITALS — Ht 60.0 in | Wt 188.0 lb

## 2017-09-20 DIAGNOSIS — R079 Chest pain, unspecified: Secondary | ICD-10-CM

## 2017-09-20 DIAGNOSIS — Z01818 Encounter for other preprocedural examination: Secondary | ICD-10-CM | POA: Diagnosis present

## 2017-09-20 DIAGNOSIS — I2510 Atherosclerotic heart disease of native coronary artery without angina pectoris: Secondary | ICD-10-CM | POA: Diagnosis not present

## 2017-09-20 DIAGNOSIS — I350 Nonrheumatic aortic (valve) stenosis: Secondary | ICD-10-CM | POA: Insufficient documentation

## 2017-09-20 LAB — MYOCARDIAL PERFUSION IMAGING
CHL CUP NUCLEAR SDS: 3
CHL CUP NUCLEAR SSS: 11
CSEPPHR: 93 {beats}/min
LHR: 0.25
LV dias vol: 130 mL (ref 46–106)
LVSYSVOL: 63 mL
Rest HR: 75 {beats}/min
SRS: 8
TID: 0.95

## 2017-09-20 MED ORDER — TECHNETIUM TC 99M TETROFOSMIN IV KIT
10.5000 | PACK | Freq: Once | INTRAVENOUS | Status: AC | PRN
  Administered 2017-09-20: 10.5 via INTRAVENOUS
  Filled 2017-09-20: qty 11

## 2017-09-20 MED ORDER — TECHNETIUM TC 99M TETROFOSMIN IV KIT
30.6000 | PACK | Freq: Once | INTRAVENOUS | Status: AC | PRN
  Administered 2017-09-20: 30.6 via INTRAVENOUS
  Filled 2017-09-20: qty 31

## 2017-09-20 MED ORDER — REGADENOSON 0.4 MG/5ML IV SOLN
0.4000 mg | Freq: Once | INTRAVENOUS | Status: AC
  Administered 2017-09-20: 0.4 mg via INTRAVENOUS

## 2017-09-23 ENCOUNTER — Telehealth (INDEPENDENT_AMBULATORY_CARE_PROVIDER_SITE_OTHER): Payer: Self-pay | Admitting: Orthopedic Surgery

## 2017-09-23 NOTE — Telephone Encounter (Signed)
Patients son called to see if y'all have gotten the results of the nuclear stress test from Dr. Agustin Cree. Please call son [name] # [phone]

## 2017-09-23 NOTE — Telephone Encounter (Signed)
Have either of you seen these results

## 2017-09-23 NOTE — Telephone Encounter (Signed)
Yes, we received info that she is an acceptable risk for surgery.  Will call and schedule.

## 2017-09-27 NOTE — Telephone Encounter (Signed)
IC LM advising received and someone would be contacting them from our office to get surgery scheduled.

## 2017-10-03 ENCOUNTER — Emergency Department (HOSPITAL_COMMUNITY)
Admission: EM | Admit: 2017-10-03 | Discharge: 2017-10-04 | Disposition: A | Discharge status: Home / Self Care | Payer: Medicare Other | Attending: Emergency Medicine | Admitting: Emergency Medicine

## 2017-10-03 ENCOUNTER — Encounter (HOSPITAL_COMMUNITY): Payer: Self-pay | Admitting: Emergency Medicine

## 2017-10-03 ENCOUNTER — Other Ambulatory Visit: Payer: Self-pay

## 2017-10-03 ENCOUNTER — Emergency Department (HOSPITAL_COMMUNITY): Payer: Medicare Other

## 2017-10-03 DIAGNOSIS — R1013 Epigastric pain: Secondary | ICD-10-CM | POA: Diagnosis not present

## 2017-10-03 DIAGNOSIS — Z5321 Procedure and treatment not carried out due to patient leaving prior to being seen by health care provider: Secondary | ICD-10-CM | POA: Insufficient documentation

## 2017-10-03 LAB — BASIC METABOLIC PANEL
Anion gap: 13 (ref 5–15)
BUN: 23 mg/dL — AB (ref 6–20)
CALCIUM: 8.8 mg/dL — AB (ref 8.9–10.3)
CHLORIDE: 95 mmol/L — AB (ref 101–111)
CO2: 26 mmol/L (ref 22–32)
CREATININE: 7.08 mg/dL — AB (ref 0.44–1.00)
GFR, EST AFRICAN AMERICAN: 6 mL/min — AB (ref >60)
GFR, EST NON AFRICAN AMERICAN: 5 mL/min — AB (ref >60)
Glucose, Bld: 112 mg/dL — ABNORMAL HIGH (ref 65–99)
Potassium: 4.3 mmol/L (ref 3.5–5.1)
SODIUM: 134 mmol/L — AB (ref 135–145)

## 2017-10-03 LAB — CBC
HCT: 37.2 % (ref 36.0–46.0)
Hemoglobin: 12.3 g/dL (ref 12.0–15.0)
MCH: 36.6 pg — ABNORMAL HIGH (ref 26.0–34.0)
MCHC: 33.1 g/dL (ref 30.0–36.0)
MCV: 110.7 fL — AB (ref 78.0–100.0)
PLATELETS: 231 10*3/uL (ref 150–400)
RBC: 3.36 MIL/uL — AB (ref 3.87–5.11)
RDW: 15 % (ref 11.5–15.5)
WBC: 5.5 10*3/uL (ref 4.0–10.5)

## 2017-10-03 LAB — I-STAT TROPONIN, ED: TROPONIN I, POC: 0.02 ng/mL (ref 0.00–0.08)

## 2017-10-03 NOTE — ED Triage Notes (Signed)
Reports epigastric pain that started tonight when laying down.  Reports she didn't eat dinner because her stomach felt off.  Endorses pain that radiates into the back.  Denies having any nausea but has had 3 soft stools today.  Also endorses lots of belching.  Has not taken anything for pain.

## 2017-10-04 NOTE — ED Notes (Signed)
Pt states that she is leaving because her son has to go to work in a few hours. Encouraged the pt to stay and be seen, pt still decided to leave. Staff told patient to come back if anything gets worse.

## 2017-10-08 ENCOUNTER — Other Ambulatory Visit: Payer: Self-pay | Admitting: Orthopedic Surgery

## 2017-10-14 ENCOUNTER — Telehealth (INDEPENDENT_AMBULATORY_CARE_PROVIDER_SITE_OTHER): Payer: Self-pay | Admitting: Orthopedic Surgery

## 2017-10-14 NOTE — Telephone Encounter (Signed)
FYI:  I called [facility] and they confirmed that this patient did have a CT Scan of the right shoulder and the CD was sent by their courier on 08-10-17 to our office and has a signature.  They are sending another CD tomorrow morning.  Our schedule indicates that an appointment was made for the patient on 4/25 to see Dr. Marlou Sa this Monday May 13th @ 1:30pm for shoulder pain.  I have left a voicemail message with my direct number requesting patient to call me.

## 2017-10-14 NOTE — Pre-Procedure Instructions (Signed)
Roberta Bryant  10/14/2017    Your procedure is scheduled on Friday, May 17.  Report to [facility] Admitting at 1:00 PM               Your surgery or procedure is scheduled 3:00 PM  .  Call this number if you have problems the morning of surgery:  [phone]- pre- op desk               For any other questions prior to surgery Monday - Friday, 8:00 AM - 4:00 PM, call [phone]- PAT desk, ask to speak to any nurse.     Remember:  Do not eat food or drink liquids after midnight Thursday, May 16  Take these medicines the morning of surgery with A SIP OF WATER : ezetimibe (ZETIA)  fenofibrate  levothyroxine (SYNTHROID, LEVOTHROID)  omeprazole (PRILOSEC)  May take if needed: acetaminophen (TYLENOL)  1 Week prior to surgery STOP taking Aspirin Products (Goody Powder, Excedrin Migraine), Ibuprofen (Advil), Naproxen (Aleve), Viatiams and Herbal Products (ie Fish Oil Tumeric) Special instructions:    [facility]- Preparing For Surgery  Before surgery, you can play an important role. Because skin is not sterile, your skin needs to be as free of germs as possible. You can reduce the number of germs on your skin by washing with CHG (chlorahexidine gluconate) Soap before surgery.  CHG is an antiseptic cleaner which kills germs and bonds with the skin to continue killing germs even after washing.  Oral Hygiene is also important to reduce your risk of infection.  Remember - BRUSH YOUR TEETH THE MORNING OF SURGERY  Please do not use if you have an allergy to CHG or antibacterial soaps. If your skin becomes reddened/irritated stop using the CHG.  Do not shave (including legs and underarms) for at least 48 hours prior to first CHG shower. It is OK to shave your face.  Please follow these instructions carefully.   1. Shower the NIGHT BEFORE SURGERY and the MORNING OF SURGERY with CHG.   2. If you chose to wash your hair, wash your hair first as usual with your normal  shampoo.  3. After you shampoo, rinse your hair and body thoroughly to remove the shampoo.  4. Use CHG as you would any other liquid soap. You can apply CHG directly to the skin and wash gently with a scrungie or a clean washcloth.   5. Apply the CHG Soap to your body ONLY FROM THE NECK DOWN.  Do not use on open wounds or open sores. Avoid contact with your eyes, ears, mouth and genitals (private parts). Wash Face and genitals (private parts)  with your normal soap.  6. Wash thoroughly, paying special attention to the area where your surgery will be performed.  7. Thoroughly rinse your body with warm water from the neck down.  8. DO NOT shower/wash with your normal soap after using and rinsing off the CHG Soap.  9. Pat yourself dry with a CLEAN TOWEL.  10. Wear CLEAN PAJAMAS to bed the night before surgery, wear comfortable clothes the morning of surgery  11. Place CLEAN SHEETS on your bed the night of your first shower and DO NOT SLEEP WITH PETS.  Day of Surgery: Shower as Above Do not apply any deodorants/lotions, powders or colognes.. Please wear clean clothes to the hospital/surgery center.  Remember to brush your teeth.    Do not wear jewelry, make-up or nail polish.  Do not  shave 48 hours prior to surgery.  Men may shave face and neck.  Do not bring valuables to the hospital.  [facility] is not responsible for any belongings or valuables.  Contacts, dentures or bridgework may not be worn into surgery.  Leave your suitcase in the car.  After surgery it may be brought to your room.  For patients admitted to the hospital, discharge time will be determined by your treatment team.  Patients discharged the day of surgery will not be allowed to drive home.   Please read over the following fact sheets that you were given: Pain Booklet, Incentive Spirometry, Surgical Site Infections.

## 2017-10-14 NOTE — Progress Notes (Signed)
   How to Manage Your Diabetes Before and After Surgery  Why is it important to control my blood sugar before and after surgery? . Improving blood sugar levels before and after surgery helps healing and can limit problems. . A way of improving blood sugar control is eating a healthy diet by: o  Eating less sugar and carbohydrates o  Increasing activity/exercise o  Talking with your doctor about reaching your blood sugar goals . High blood sugars (greater than 180 mg/dL) can raise your risk of infections and slow your recovery, so you will need to focus on controlling your diabetes during the weeks before surgery. . Make sure that the doctor who takes care of your diabetes knows about your planned surgery including the date and location.  How do I manage my blood sugar before surgery? . Check your blood sugar at least 4 times a day, starting 2 days before surgery, to make sure that the level is not too high or low. o Check your blood sugar the morning of your surgery when you wake up and every 2 hours until you get to the Short Stay unit. . If your blood sugar is less than 70 mg/dL, you will need to treat for low blood sugar: o Do not take insulin. o Treat a low blood sugar (less than 70 mg/dL) with  cup of clear juice (cranberry or apple), 4 glucose tablets, OR glucose gel. Recheck blood sugar in 15 minutes after treatment (to make sure it is greater than 70 mg/dL). If your blood sugar is not greater than 70 mg/dL on recheck, call [phone] o  for further instructions. . Report your blood sugar to the short stay nurse when you get to Short Stay.  . If you are admitted to the hospital after surgery: o Your blood sugar will be checked by the staff and you will probably be given insulin after surgery (instead of oral diabetes medicines) to make sure you have good blood sugar levels. o The goal for blood sugar control after surgery is 80-180 mg/dL.   WHAT DO I DO ABOUT MY DIABETES  MEDICATION?   Marland Kitchen Do not take oral diabetes medicines (pills)- Januvia the morning of surgery- .  Marland Kitchen The day of surgery, do not take other diabetes injectables, including Byetta (exenatide), Bydureon (exenatide ER), Victoza (liraglutide), or Trulicity (dulaglutide).   Patient Signature:  Date:   Nurse Signature:  Date:   Reviewed and Endorsed by [facility] Patient Education Committee, August 2015

## 2017-10-14 NOTE — Pre-Procedure Instructions (Signed)
Roberta Bryant  10/14/2017    Your procedure is scheduled on Friday, May 17.  Report to [facility] Admitting at 1:00 PM               Your surgery or procedure is scheduled 3:00 PM  .  Call this number if you have problems the morning of surgery:  [phone]- pre- op desk               For any other questions prior to surgery Monday - Friday, 8:00 AM - 4:00 PM, call [phone]- PAT desk, ask to speak to any nurse.     Remember:  Do not eat food or drink liquids after midnight Thursday, May 16  Take these medicines the morning of surgery with A SIP OF WATER : ezetimibe (ZETIA)  fenofibrate  levothyroxine (SYNTHROID, LEVOTHROID)  omeprazole (PRILOSEC)  May take if needed: acetaminophen (TYLENOL)  Special instructions:   [facility]- Preparing For Surgery  Before surgery, you can play an important role. Because skin is not sterile, your skin needs to be as free of germs as possible. You can reduce the number of germs on your skin by washing with CHG (chlorahexidine gluconate) Soap before surgery.  CHG is an antiseptic cleaner which kills germs and bonds with the skin to continue killing germs even after washing.  Oral Hygiene is also important to reduce your risk of infection.  Remember - BRUSH YOUR TEETH THE MORNING OF SURGERY  Please do not use if you have an allergy to CHG or antibacterial soaps. If your skin becomes reddened/irritated stop using the CHG.  Do not shave (including legs and underarms) for at least 48 hours prior to first CHG shower. It is OK to shave your face.  Please follow these instructions carefully.   1. Shower the NIGHT BEFORE SURGERY and the MORNING OF SURGERY with CHG.   2. If you chose to wash your hair, wash your hair first as usual with your normal shampoo.  3. After you shampoo, rinse your hair and body thoroughly to remove the shampoo.  4. Use CHG as you would any other liquid soap. You can apply CHG directly to the skin and  wash gently with a scrungie or a clean washcloth.   5. Apply the CHG Soap to your body ONLY FROM THE NECK DOWN.  Do not use on open wounds or open sores. Avoid contact with your eyes, ears, mouth and genitals (private parts). Wash Face and genitals (private parts)  with your normal soap.  6. Wash thoroughly, paying special attention to the area where your surgery will be performed.  7. Thoroughly rinse your body with warm water from the neck down.  8. DO NOT shower/wash with your normal soap after using and rinsing off the CHG Soap.  9. Pat yourself dry with a CLEAN TOWEL.  10. Wear CLEAN PAJAMAS to bed the night before surgery, wear comfortable clothes the morning of surgery  11. Place CLEAN SHEETS on your bed the night of your first shower and DO NOT SLEEP WITH PETS.  Day of Surgery: Shower as Above Do not apply any deodorants/lotions, powders or colognes.. Please wear clean clothes to the hospital/surgery center.  Remember to brush your teeth.    Do not wear jewelry, make-up or nail polish.  Do not shave 48 hours prior to surgery.  Men may shave face and neck.  Do not bring valuables to the hospital.  [facility] is not  responsible for any belongings or valuables.  Contacts, dentures or bridgework may not be worn into surgery.  Leave your suitcase in the car.  After surgery it may be brought to your room.  For patients admitted to the hospital, discharge time will be determined by your treatment team.  Patients discharged the day of surgery will not be allowed to drive home.   Please read over the following fact sheets that you were given: Pain Booklet, Incentive Spirometry, Surgical Site Infections.

## 2017-10-15 ENCOUNTER — Other Ambulatory Visit (HOSPITAL_COMMUNITY): Payer: Medicare Other

## 2017-10-15 ENCOUNTER — Encounter (HOSPITAL_COMMUNITY): Payer: Self-pay

## 2017-10-15 ENCOUNTER — Other Ambulatory Visit: Payer: Self-pay | Admitting: Orthopedic Surgery

## 2017-10-15 ENCOUNTER — Other Ambulatory Visit: Payer: Self-pay

## 2017-10-15 ENCOUNTER — Encounter (HOSPITAL_COMMUNITY)
Admission: RE | Admit: 2017-10-15 | Discharge: 2017-10-15 | Disposition: A | Discharge status: Home / Self Care | Payer: Medicare Other | Source: Ambulatory Visit | Attending: Orthopedic Surgery | Admitting: Orthopedic Surgery

## 2017-10-15 DIAGNOSIS — Z79899 Other long term (current) drug therapy: Secondary | ICD-10-CM | POA: Diagnosis not present

## 2017-10-15 DIAGNOSIS — N186 End stage renal disease: Secondary | ICD-10-CM | POA: Diagnosis not present

## 2017-10-15 DIAGNOSIS — I132 Hypertensive heart and chronic kidney disease with heart failure and with stage 5 chronic kidney disease, or end stage renal disease: Secondary | ICD-10-CM | POA: Insufficient documentation

## 2017-10-15 DIAGNOSIS — E785 Hyperlipidemia, unspecified: Secondary | ICD-10-CM | POA: Diagnosis not present

## 2017-10-15 DIAGNOSIS — K589 Irritable bowel syndrome without diarrhea: Secondary | ICD-10-CM | POA: Insufficient documentation

## 2017-10-15 DIAGNOSIS — Z79890 Hormone replacement therapy (postmenopausal): Secondary | ICD-10-CM | POA: Insufficient documentation

## 2017-10-15 DIAGNOSIS — Z7984 Long term (current) use of oral hypoglycemic drugs: Secondary | ICD-10-CM | POA: Diagnosis not present

## 2017-10-15 DIAGNOSIS — I252 Old myocardial infarction: Secondary | ICD-10-CM | POA: Insufficient documentation

## 2017-10-15 DIAGNOSIS — M1990 Unspecified osteoarthritis, unspecified site: Secondary | ICD-10-CM | POA: Diagnosis not present

## 2017-10-15 DIAGNOSIS — Z992 Dependence on renal dialysis: Secondary | ICD-10-CM | POA: Diagnosis not present

## 2017-10-15 DIAGNOSIS — Z01812 Encounter for preprocedural laboratory examination: Secondary | ICD-10-CM | POA: Insufficient documentation

## 2017-10-15 DIAGNOSIS — Z8673 Personal history of transient ischemic attack (TIA), and cerebral infarction without residual deficits: Secondary | ICD-10-CM | POA: Diagnosis not present

## 2017-10-15 DIAGNOSIS — M6530 Trigger finger, unspecified finger: Secondary | ICD-10-CM | POA: Diagnosis not present

## 2017-10-15 DIAGNOSIS — Z01818 Encounter for other preprocedural examination: Secondary | ICD-10-CM | POA: Diagnosis not present

## 2017-10-15 DIAGNOSIS — E039 Hypothyroidism, unspecified: Secondary | ICD-10-CM | POA: Insufficient documentation

## 2017-10-15 DIAGNOSIS — I350 Nonrheumatic aortic (valve) stenosis: Secondary | ICD-10-CM | POA: Diagnosis not present

## 2017-10-15 DIAGNOSIS — E1122 Type 2 diabetes mellitus with diabetic chronic kidney disease: Secondary | ICD-10-CM | POA: Insufficient documentation

## 2017-10-15 DIAGNOSIS — Z955 Presence of coronary angioplasty implant and graft: Secondary | ICD-10-CM | POA: Insufficient documentation

## 2017-10-15 DIAGNOSIS — Z7982 Long term (current) use of aspirin: Secondary | ICD-10-CM | POA: Insufficient documentation

## 2017-10-15 DIAGNOSIS — I2510 Atherosclerotic heart disease of native coronary artery without angina pectoris: Secondary | ICD-10-CM | POA: Diagnosis not present

## 2017-10-15 HISTORY — DX: Unspecified hearing loss, unspecified ear: H91.90

## 2017-10-15 HISTORY — DX: Cardiac murmur, unspecified: R01.1

## 2017-10-15 LAB — BASIC METABOLIC PANEL
ANION GAP: 10 (ref 5–15)
BUN: 18 mg/dL (ref 6–20)
CALCIUM: 8.5 mg/dL — AB (ref 8.9–10.3)
CO2: 33 mmol/L — ABNORMAL HIGH (ref 22–32)
CREATININE: 6.25 mg/dL — AB (ref 0.44–1.00)
Chloride: 98 mmol/L — ABNORMAL LOW (ref 101–111)
GFR calc non Af Amer: 6 mL/min — ABNORMAL LOW (ref >60)
GFR, EST AFRICAN AMERICAN: 7 mL/min — AB (ref >60)
Glucose, Bld: 138 mg/dL — ABNORMAL HIGH (ref 65–99)
Potassium: 4.6 mmol/L (ref 3.5–5.1)
SODIUM: 141 mmol/L (ref 135–145)

## 2017-10-15 LAB — CBC
HCT: 36.9 % (ref 36.0–46.0)
HEMOGLOBIN: 11.8 g/dL — AB (ref 12.0–15.0)
MCH: 35.8 pg — AB (ref 26.0–34.0)
MCHC: 32 g/dL (ref 30.0–36.0)
MCV: 111.8 fL — ABNORMAL HIGH (ref 78.0–100.0)
PLATELETS: 199 10*3/uL (ref 150–400)
RBC: 3.3 MIL/uL — AB (ref 3.87–5.11)
RDW: 14.4 % (ref 11.5–15.5)
WBC: 4.6 10*3/uL (ref 4.0–10.5)

## 2017-10-15 LAB — GLUCOSE, CAPILLARY: Glucose-Capillary: 133 mg/dL — ABNORMAL HIGH (ref 65–99)

## 2017-10-15 NOTE — Progress Notes (Signed)
Mrs Mccormick has Type II DM, she does not check CBG's , she doesn't have strips and they are too expensive.  I requested last A1C drawn at [facility].  Patient reports that it was  4.5.  Ms Roberta Bryant reports that sh has chest pain at time over lower stereum comes and goes , no lightheadedness, N/V , shortness of breath.  'It goes away when she belches.'  Patient  Also reports left sided chest pain, "really a spasm, she has told cardiologist Dr Raliegh Ip and he said it is muscle spasm and to treat it with [facility], patient states that she does that and it goes away.  Patient has had these pain for a long time and her Drs are aware.  I spoke with Demetrios Isaacs, NP-C and no new orders were given.

## 2017-10-15 NOTE — Progress Notes (Signed)
   How to Manage Your Diabetes Before and After Surgery  Why is it important to control my blood sugar before and after surgery? . Improving blood sugar levels before and after surgery helps healing and can limit problems. . A way of improving blood sugar control is eating a healthy diet by: o  Eating less sugar and carbohydrates o  Increasing activity/exercise o  Talking with your doctor about reaching your blood sugar goals . High blood sugars (greater than 180 mg/dL) can raise your risk of infections and slow your recovery, so you will need to focus on controlling your diabetes during the weeks before surgery. . Make sure that the doctor who takes care of your diabetes knows about your planned surgery including the date and location.  How do I manage my blood sugar before surgery? . Check your blood sugar at least 4 times a day, starting 2 days before surgery, to make sure that the level is not too high or low. o Check your blood sugar the morning of your surgery when you wake up and every 2 hours until you get to the Short Stay unit. . If your blood sugar is less than 70 mg/dL, you will need to treat for low blood sugar: o Do not take insulin. o Treat a low blood sugar (less than 70 mg/dL) with  cup of clear juice (cranberry or apple), 4 glucose tablets, OR glucose gel. Recheck blood sugar in 15 minutes after treatment (to make sure it is greater than 70 mg/dL). If your blood sugar is not greater than 70 mg/dL on recheck, call [phone] o  for further instructions. . Report your blood sugar to the short stay nurse when you get to Short Stay.  . If you are admitted to the hospital after surgery: o Your blood sugar will be checked by the staff and you will probably be given insulin after surgery (instead of oral diabetes medicines) to make sure you have good blood sugar levels. o The goal for blood sugar control after surgery is 80-180 mg/dL.   WHAT DO I DO ABOUT MY DIABETES  MEDICATION?   Marland Kitchen Do not take oral diabetes medicines (pills)- Januvia the morning of surgery- .  Marland Kitchen The day of surgery, do not take other diabetes injectables, including Byetta (exenatide), Bydureon (exenatide ER), Victoza (liraglutide), or Trulicity (dulaglutide).   Patient Signature:  Date:   Nurse Signature:  Date:   Reviewed and Endorsed by [facility] Patient Education Committee, August 2015

## 2017-10-18 ENCOUNTER — Ambulatory Visit (INDEPENDENT_AMBULATORY_CARE_PROVIDER_SITE_OTHER): Payer: Medicare Other | Admitting: Orthopedic Surgery

## 2017-10-18 ENCOUNTER — Encounter (INDEPENDENT_AMBULATORY_CARE_PROVIDER_SITE_OTHER): Payer: Self-pay | Admitting: Orthopedic Surgery

## 2017-10-18 DIAGNOSIS — I2510 Atherosclerotic heart disease of native coronary artery without angina pectoris: Secondary | ICD-10-CM | POA: Diagnosis not present

## 2017-10-18 DIAGNOSIS — M75121 Complete rotator cuff tear or rupture of right shoulder, not specified as traumatic: Secondary | ICD-10-CM

## 2017-10-18 NOTE — Progress Notes (Signed)
Anesthesia Chart Review:   Case:  [id number] Date/Time:  10/22/17 1445   Procedure:  RELEASE RIGHT INDEX TRIGGER FINGER/A-1 PULLEY (Right )   Anesthesia type:  Regional   Pre-op diagnosis:  TRIGGER RIGHT INDEX FINGER   Location:  [facility] ROOM 04 / [facility]   Surgeon:  Daryll Brod, MD      DISCUSSION:  - Pt is a 67 year old female with hx CAD (DES to LM 01/2016), aortic stenosis (moderate to severe by 08/2017 echo), CHF, ESRD on hemodialysis, HTN, DM.   - Pt reported to pre-admission testing RN 2 kinds of chest pain that are long-standing.  1st, she gets pain over lower sternal area that is relieved by belching.  2nd, she gets left sided muscle spasm pain relieved by icy hot.  She reports her cardiologist is aware of both of these issues, and they are not new.   - Pt has cardiac clearance for a different surgery (shoulder replacement- has not happened yet) in media tab.    VS: BP 111/65   Pulse 78   Temp 36.8 C (Oral)   Resp 20   Ht 4\' 10"  (1.473 m) Comment: stated  Wt 190 lb 4.1 oz (86.3 kg)   SpO2 100%   BMI 39.76 kg/m    PROVIDERS: Receives primary care at [facility] in [place] is [name], MD.  Last office visit 07/23/17  Nephrologist is Edrick Oh, MD Neurologist is Margette Fast, MD    LABS: Labs reviewed: Acceptable for surgery.  - Renal function consistent with ESRD - HbA1c was 4.7 at [facility] office 09/30/17  (all labs ordered are listed, but only abnormal results are displayed)  Labs Reviewed  GLUCOSE, CAPILLARY - Abnormal; Notable for the following components:      Result Value   Glucose-Capillary 133 (*)    All other components within normal limits  BASIC METABOLIC PANEL - Abnormal; Notable for the following components:   Chloride 98 (*)    CO2 33 (*)    Glucose, Bld 138 (*)    Creatinine, Ser 6.25 (*)    Calcium 8.5 (*)    GFR calc non Af Amer 6 (*)    GFR calc Af Amer 7 (*)    All other components within  normal limits  CBC - Abnormal; Notable for the following components:   RBC 3.30 (*)    Hemoglobin 11.8 (*)    MCV 111.8 (*)    MCH 35.8 (*)    All other components within normal limits     IMAGES:  CXR 10/04/17: No acute cardiopulmonary process. Basilar atelectasis/scarring.   EKG 10/03/17: NSR. ST & T wave abnormality, consider lateral ischemia. Prolonged QT. Appears stable when compared to EKG 10/09/16   CV:  Nuclear stress test 09/20/17:   Nuclear stress EF: 52%.  There was no ST segment deviation noted during stress.  The study is normal.  This is a low risk study.  The left ventricular ejection fraction is mildly decreased (45-54%). - Diaphragmatic attenuation no ischemia or infarct EF 52%  Echo 08/18/17:  - Left ventricle: The cavity size was normal. Wall thickness was at the upper limits of normal. Systolic function was mildly to moderately reduced. The estimated ejection fraction was in the range of 40% to 45%. Diffuse hypokinesis. Doppler parameters are consistent with abnormal left ventricular relaxation (grade 1 diastolic dysfunction). Doppler parameters are consistent with high ventricular filling pressure. - Aortic valve: Valve mobility was  moderately restricted. There was moderate to severe stenosis. There was mild regurgitation. Mean gradient (S): 24 mm Hg. Peak gradient (S): 41 mm Hg. VTI ratio of LVOT to aortic valve: 0.23. Valve area (VTI): 0.8 cm^2. Valve area (Vmax): 0.9 cm^2. Valve area (Vmean): 0.88 cm^2. SVI 30 cc/min. - Mitral valve: Mildly calcified annulus. Valve area by continuity   equation (using LVOT flow): 1.8 cm^2. - Left atrium: The atrium was mildly dilated. - Right ventricle: Systolic function was mildly reduced.  Carotid duplex 09/11/16: 1. Bilateral carotid bifurcation and proximal ICA plaque, resulting in less than 50% diameter stenosis. 2. Antegrade bilateral vertebral arterial flow.  Cardiac cath 01/06/16 (care everywhere): 1. Severe  stenosis of the left main coronary artery.S/p DES to LM 01/08/16 (60% stenosis reduced to 10%) 2. Otherwise nonobstructive CAD(LAD, CX, and RCA with <20%)   Past Medical History:  Diagnosis Date  . Anemia   . Aortic stenosis   . CHF (congestive heart failure) ([place])   . Childhood asthma   . Chronic upper back pain   . Coronary artery disease    Stent to left main coronary artery second of August 2017  . Decreased hearing   . ESRD (end stage renal disease) on dialysis ([place])    "TTS; [facility] in [place]" (06/22/2017)  . Gait abnormality 08/28/2016  . GERD (gastroesophageal reflux disease)   . Headache    "a few times/week" (10/30/2014)  . Headache syndrome 01/08/2017  . Heart murmur   . History of blood transfusion 1996   "related to menses"  . Hyperlipidemia   . Hypertension   . Hypothyroidism   . IBS (irritable bowel syndrome)   . Myocardial infarction ([place]) 2004  . Osteoarthritis   . Pneumonia    "years ago"  . Spondylosis of cervical spine 06/16/2017  . Stroke ([place]) 07/2016   mini stroke , right side of body weak-   . Type II diabetes mellitus ([place])     Past Surgical History:  Procedure Laterality Date  . APPENDECTOMY    . AV FISTULA PLACEMENT Left 12/03/2006   "forearm"  . AV FISTULA REPAIR Left 11/25/2009   "forearm"  . CARPAL TUNNEL RELEASE Right 02/11/2017   Procedure: RIGHT CARPAL TUNNEL RELEASE;  Surgeon: Daryll Brod, MD;  Location: [facility];  Service: Orthopedics;  Laterality: Right;  . CHOLECYSTECTOMY OPEN  1970's  . COLONOSCOPY W/ POLYPECTOMY    . CORONARY ANGIOPLASTY    . CORONARY ANGIOPLASTY WITH STENT PLACEMENT    . INSERTION OF DIALYSIS CATHETER Right 2010   "chest"  . PARATHYROIDECTOMY  2010?   Parathyroid autotransplantation   . REVISON OF ARTERIOVENOUS FISTULA Left 12/04/2013   Procedure: REVISON OF ARTERIOVENOUS FISTULA;  Surgeon: Rosetta Posner, MD;  Location: [facility];  Service: Vascular;  Laterality: Left;  . REVISON OF ARTERIOVENOUS  FISTULA Left 12/10/1698   Procedure: PLICATION OF LEFT BRACHIOCEPHALIC  ARTERIOVENOUS FISTULA;  Surgeon: Conrad [place]ville, MD;  Location: [facility];  Service: Vascular;  Laterality: Left;  . SHOULDER ARTHROSCOPY W/ ROTATOR CUFF REPAIR Right 06/22/2017   WITH EXTENSIVE DEBRIDEMENT  . SHOULDER ARTHROSCOPY WITH ROTATOR CUFF REPAIR AND SUBACROMIAL DECOMPRESSION Right 06/22/2017   Procedure: RIGHT SHOULDER ARTHROSCOPY WITH EXTENSIVE DEBRIDEMENT;  Surgeon: Mcarthur Rossetti, MD;  Location: [facility];  Service: Orthopedics;  Laterality: Right;  . TUBAL LIGATION  1989  . UMBILICAL HERNIA REPAIR  1990's X 2    MEDICATIONS: . acetaminophen (TYLENOL) 325 MG tablet  . aspirin EC 81 MG tablet  .  B Complex-C-Zn-Folic Acid (DIALYVITE 676 WITH ZINC) 0.8 MG TABS  . carvedilol (COREG) 12.5 MG tablet  . clonazePAM (KLONOPIN) 0.5 MG tablet  . dimenhyDRINATE (DRAMAMINE) 50 MG tablet  . docusate sodium (COLACE) 100 MG capsule  . ezetimibe (ZETIA) 10 MG tablet  . fenofibrate 160 MG tablet  . finasteride (PROSCAR) 5 MG tablet  . lanthanum (FOSRENOL) 1000 MG chewable tablet  . levothyroxine (SYNTHROID, LEVOTHROID) 50 MCG tablet  . lidocaine-prilocaine (EMLA) cream  . liraglutide (VICTOZA) 18 MG/3ML SOPN  . loperamide (IMODIUM A-D) 2 MG tablet  . nortriptyline (PAMELOR) 10 MG capsule  . omeprazole (PRILOSEC) 20 MG capsule  . Oxycodone HCl 10 MG TABS  . sitaGLIPtin (JANUVIA) 25 MG tablet  . Turmeric Curcumin 500 MG CAPS  . Vitamin D, Ergocalciferol, (DRISDOL) 50000 units CAPS capsule  . Zinc 50 MG CAPS   No current facility-administered medications for this encounter.     If no changes, I anticipate pt can proceed with surgery as scheduled.   Willeen Cass, FNP-BC [facility] Short Stay Surgical Center/Anesthesiology Phone: [phone] 10/18/2017 11:42 AM

## 2017-10-19 ENCOUNTER — Encounter (INDEPENDENT_AMBULATORY_CARE_PROVIDER_SITE_OTHER): Payer: Self-pay | Admitting: Orthopedic Surgery

## 2017-10-19 NOTE — Progress Notes (Signed)
Office Visit Note   Patient: Roberta Bryant           Date of Birth: 01-03-1951           MRN: [id number] Visit Date: 10/18/2017 Requested by: [facility] [address], [place] [address] PCP: Alanson Puls, [facility]  Subjective: Chief Complaint  Patient presents with  . Right Shoulder - Follow-up, Pain    HPI: Patient presents for follow-up of right shoulder.  She describes continued pain.  She would like to have shoulder surgery but is scheduled for trigger finger release in 3 days.  She would like to wait 6 to 8 weeks after that until she gets her surgery.  She had a stress test in mid April the results of which are reviewed today.  That looks fairly reasonable but she has yet to follow-up with her cardiologist regarding the official interpretation of that test.  She does see him in 5 days.  She still having significant pain and disability with the right shoulder.  She has an irreparable rotator cuff tear.  She denies any fevers or chills.              ROS: All systems reviewed are negative as they relate to the chief complaint within the history of present illness.  Patient denies  fevers or chills.   Assessment & Plan: Visit Diagnoses:  1. Complete tear of right rotator cuff, unspecified whether traumatic     Plan: Impression is right shoulder pain with continued debilitation from her last shoulder surgery in January.  Nonrepairable rotator cuff tear was observed at that time.  The patient's deltoid still functions.  Hemoglobin A1c 4.7.  Once the patient undergoes her hand surgery and we obtain official risk stratification from her cardiologist we can proceed with surgical intervention on the shoulder.  The risk and benefits are discussed with her including but not limited to infection nerve damage incomplete functional restoration and limitation of motion.  Continued pain is also a risk.  All questions answered.  Follow-Up Instructions: No  follow-ups on file.   Orders:  No orders of the defined types were placed in this encounter.  No orders of the defined types were placed in this encounter.     Procedures: No procedures performed   Clinical Data: No additional findings.  Objective: Vital Signs: There were no vitals taken for this visit.  Physical Exam:   Constitutional: Patient appears well-developed HEENT:  Head: Normocephalic Eyes:EOM are normal Neck: Normal range of motion Cardiovascular: Normal rate Pulmonary/chest: Effort normal Neurologic: Patient is alert Skin: Skin is warm Psychiatric: Patient has normal mood and affect    Ortho Exam: Orthopedic exam demonstrates pretty reasonable cervical spine range of motion.  Her shoulder is painful with motion but less so than compared to when I saw her several months ago.  Passively I can take her up to around 70 or 80 of abduction and forward flexion pain at that level.  L toyed is functional.  Skin is intact in the right shoulder region.  Motor or sensory function to the right hand is intact with intact radial pulse.  Specialty Comments:  No specialty comments available.  Imaging: No results found.   PMFS History: Patient Active Problem List   Diagnosis Date Noted  . Status post arthroscopy of right shoulder 06/22/2017  . Spondylosis of cervical spine 06/16/2017  . Aortic stenosis 04/12/2017  . Coronary artery diseaseStatus post PTCA and stenting with drug-eluting  stent to left main coronary artery in the summer 2017 04/12/2017  . 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 Headache syndrome 01/08/2017  . Right carpal tunnel syndrome 12/14/2016  . Complete tear of right rotator cuff 10/21/2016  . Gait abnormality 08/28/2016  . Chest pain 11/23/2015  . ESRD on dialysis ([place])   . Type 2 diabetes mellitus with complication ([place])   . Essential hypertension   . Abnormal ECG   . Pain in the chest   . Hypotension 10/30/2014  . Abnormal EKG 10/30/2014  . 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 End stage renal disease ([place]) 07/18/2012  . Other complications due to renal dialysis device, implant, and graft 07/18/2012   Past Medical History:  Diagnosis Date  . Anemia   . Aortic stenosis   . CHF (congestive heart failure) ([place])   . Childhood asthma   . Chronic upper back pain   . Coronary artery disease    Stent to left main coronary artery second of August 2017  . Decreased hearing   . ESRD (end stage renal disease) on dialysis ([place])    "TTS; [facility] in [place]" (06/22/2017)  . Gait abnormality 08/28/2016  . GERD (gastroesophageal reflux disease)   . Headache    "a few times/week" (10/30/2014)  . Headache syndrome 01/08/2017  . Heart murmur   . History of blood transfusion 1996   "related to menses"  . Hyperlipidemia   . Hypertension   . Hypothyroidism   . IBS (irritable bowel syndrome)   . Myocardial infarction ([place]) 2004  . Osteoarthritis   . Pneumonia    "years ago"  . Spondylosis of cervical spine 06/16/2017  . Stroke ([place]) 07/2016   mini stroke , right side of body weak-   . Type II diabetes mellitus ([facility])     Family History  Problem Relation Age of Onset  . Epilepsy Mother   . Diabetes Mother   . Cancer Mother   . Alcohol abuse Father     Past Surgical History:  Procedure Laterality Date  . APPENDECTOMY    . AV FISTULA PLACEMENT Left 12/03/2006   "forearm"  . AV FISTULA REPAIR Left 11/25/2009   "forearm"  . CARPAL TUNNEL RELEASE Right 02/11/2017   Procedure: RIGHT CARPAL TUNNEL RELEASE;  Surgeon: Daryll Brod, MD;  Location: [facility];  Service: Orthopedics;  Laterality: Right;  . CHOLECYSTECTOMY OPEN  1970's  . COLONOSCOPY W/ POLYPECTOMY    . CORONARY ANGIOPLASTY    . CORONARY ANGIOPLASTY WITH STENT PLACEMENT    . INSERTION OF DIALYSIS CATHETER Right 2010   "chest"  . PARATHYROIDECTOMY  2010?   Parathyroid autotransplantation   . REVISON OF ARTERIOVENOUS FISTULA Left 12/04/2013    Procedure: REVISON OF ARTERIOVENOUS FISTULA;  Surgeon: Rosetta Posner, MD;  Location: [facility];  Service: Vascular;  Laterality: Left;  . REVISON OF ARTERIOVENOUS FISTULA Left 02/06/4781   Procedure: PLICATION OF LEFT BRACHIOCEPHALIC  ARTERIOVENOUS FISTULA;  Surgeon: Conrad [place] Williamson, MD;  Location: [facility];  Service: Vascular;  Laterality: Left;  . SHOULDER ARTHROSCOPY W/ ROTATOR CUFF REPAIR Right 06/22/2017   WITH EXTENSIVE DEBRIDEMENT  . SHOULDER ARTHROSCOPY WITH ROTATOR CUFF REPAIR AND SUBACROMIAL DECOMPRESSION Right 06/22/2017   Procedure: RIGHT SHOULDER ARTHROSCOPY WITH EXTENSIVE DEBRIDEMENT;  Surgeon: Mcarthur Rossetti, MD;  Location: [facility];  Service: Orthopedics;  Laterality: Right;  . TUBAL LIGATION  1989  . UMBILICAL HERNIA REPAIR  1990's X 2   Social History   Occupational History  .  Occupation: Disabled  Tobacco Use  . Smoking status: Former Smoker    Years: 3.00    Types: Cigarettes  . Smokeless tobacco: Never Used  . Tobacco comment: "stopped smoking in the 1970's"  Substance and Sexual Activity  . Alcohol use: No  . Drug use: Yes    Types: Marijuana, Hydromorphone    Comment: as a teenager  . Sexual activity: Not Currently

## 2017-10-22 ENCOUNTER — Ambulatory Visit (HOSPITAL_COMMUNITY): Payer: Medicare Other | Admitting: Emergency Medicine

## 2017-10-22 ENCOUNTER — Ambulatory Visit (HOSPITAL_COMMUNITY): Payer: Medicare Other | Admitting: Anesthesiology

## 2017-10-22 ENCOUNTER — Encounter (HOSPITAL_COMMUNITY)
Admission: RE | Disposition: A | Discharge status: Home / Self Care | Payer: Self-pay | Source: Ambulatory Visit | Attending: Orthopedic Surgery

## 2017-10-22 ENCOUNTER — Ambulatory Visit (HOSPITAL_COMMUNITY)
Admission: RE | Admit: 2017-10-22 | Discharge: 2017-10-22 | Disposition: A | Discharge status: Home / Self Care | Payer: Medicare Other | Source: Ambulatory Visit | Attending: Orthopedic Surgery | Admitting: Orthopedic Surgery

## 2017-10-22 DIAGNOSIS — N186 End stage renal disease: Secondary | ICD-10-CM | POA: Diagnosis not present

## 2017-10-22 DIAGNOSIS — I69351 Hemiplegia and hemiparesis following cerebral infarction affecting right dominant side: Secondary | ICD-10-CM | POA: Insufficient documentation

## 2017-10-22 DIAGNOSIS — I120 Hypertensive chronic kidney disease with stage 5 chronic kidney disease or end stage renal disease: Secondary | ICD-10-CM | POA: Diagnosis not present

## 2017-10-22 DIAGNOSIS — E1122 Type 2 diabetes mellitus with diabetic chronic kidney disease: Secondary | ICD-10-CM | POA: Insufficient documentation

## 2017-10-22 DIAGNOSIS — I509 Heart failure, unspecified: Secondary | ICD-10-CM | POA: Insufficient documentation

## 2017-10-22 DIAGNOSIS — I2510 Atherosclerotic heart disease of native coronary artery without angina pectoris: Secondary | ICD-10-CM | POA: Insufficient documentation

## 2017-10-22 DIAGNOSIS — Z955 Presence of coronary angioplasty implant and graft: Secondary | ICD-10-CM | POA: Diagnosis not present

## 2017-10-22 DIAGNOSIS — Z87891 Personal history of nicotine dependence: Secondary | ICD-10-CM | POA: Insufficient documentation

## 2017-10-22 DIAGNOSIS — M65321 Trigger finger, right index finger: Secondary | ICD-10-CM | POA: Diagnosis present

## 2017-10-22 DIAGNOSIS — I252 Old myocardial infarction: Secondary | ICD-10-CM | POA: Diagnosis not present

## 2017-10-22 DIAGNOSIS — Z992 Dependence on renal dialysis: Secondary | ICD-10-CM | POA: Diagnosis not present

## 2017-10-22 HISTORY — PX: TRIGGER FINGER RELEASE: SHX641

## 2017-10-22 LAB — POCT I-STAT 4, (NA,K, GLUC, HGB,HCT)
Glucose, Bld: 85 mg/dL (ref 65–99)
HCT: 38 % (ref 36.0–46.0)
Hemoglobin: 12.9 g/dL (ref 12.0–15.0)
POTASSIUM: 4.5 mmol/L (ref 3.5–5.1)
SODIUM: 140 mmol/L (ref 135–145)

## 2017-10-22 LAB — GLUCOSE, CAPILLARY
Glucose-Capillary: 100 mg/dL — ABNORMAL HIGH (ref 65–99)
Glucose-Capillary: 82 mg/dL (ref 65–99)
Glucose-Capillary: 86 mg/dL (ref 65–99)
Glucose-Capillary: 87 mg/dL (ref 65–99)

## 2017-10-22 SURGERY — RELEASE, A1 PULLEY, FOR TRIGGER FINGER
Anesthesia: Monitor Anesthesia Care | Site: Hand | Laterality: Right

## 2017-10-22 MED ORDER — BUPIVACAINE HCL (PF) 0.25 % IJ SOLN
INTRAMUSCULAR | Status: DC | PRN
  Administered 2017-10-22: 3.5 mL

## 2017-10-22 MED ORDER — OXYCODONE HCL 5 MG PO TABS: 5.0000 mg | ORAL_TABLET | Freq: Once | ORAL | Status: DC | PRN

## 2017-10-22 MED ORDER — LIDOCAINE HCL (CARDIAC) PF 100 MG/5ML IV SOSY
PREFILLED_SYRINGE | INTRAVENOUS | Status: DC | PRN
  Administered 2017-10-22: 100 mg via INTRATRACHEAL

## 2017-10-22 MED ORDER — PROMETHAZINE HCL 25 MG/ML IJ SOLN: 6.2500 mg | INTRAMUSCULAR | Status: DC | PRN

## 2017-10-22 MED ORDER — ROCURONIUM BROMIDE 50 MG/5ML IV SOLN
INTRAVENOUS | Status: AC
  Filled 2017-10-22: qty 1

## 2017-10-22 MED ORDER — 0.9 % SODIUM CHLORIDE (POUR BTL) OPTIME
TOPICAL | Status: DC | PRN
  Administered 2017-10-22: 1000 mL

## 2017-10-22 MED ORDER — MIDAZOLAM HCL 2 MG/2ML IJ SOLN
INTRAMUSCULAR | Status: AC
  Filled 2017-10-22: qty 2

## 2017-10-22 MED ORDER — PROPOFOL 10 MG/ML IV BOLUS
INTRAVENOUS | Status: AC
  Filled 2017-10-22: qty 20

## 2017-10-22 MED ORDER — FENTANYL CITRATE (PF) 250 MCG/5ML IJ SOLN
INTRAMUSCULAR | Status: AC
  Filled 2017-10-22: qty 5

## 2017-10-22 MED ORDER — LIDOCAINE HCL (PF) 1 % IJ SOLN
INTRAMUSCULAR | Status: AC
  Filled 2017-10-22: qty 30

## 2017-10-22 MED ORDER — BUPIVACAINE HCL (PF) 0.25 % IJ SOLN
INTRAMUSCULAR | Status: AC
  Filled 2017-10-22: qty 30

## 2017-10-22 MED ORDER — SODIUM CHLORIDE 0.9 % IV SOLN
INTRAVENOUS | Status: DC
  Administered 2017-10-22: 13:00:00 via INTRAVENOUS

## 2017-10-22 MED ORDER — HYDROMORPHONE HCL 2 MG/ML IJ SOLN: 0.2500 mg | INTRAMUSCULAR | Status: DC | PRN

## 2017-10-22 MED ORDER — LIDOCAINE HCL (PF) 1 % IJ SOLN
INTRAMUSCULAR | Status: DC | PRN
  Administered 2017-10-22: 3.5 mL

## 2017-10-22 MED ORDER — PROPOFOL 500 MG/50ML IV EMUL
INTRAVENOUS | Status: DC | PRN
  Administered 2017-10-22: 30 ug/kg/min via INTRAVENOUS

## 2017-10-22 MED ORDER — OXYCODONE HCL 5 MG/5ML PO SOLN: 5.0000 mg | Freq: Once | ORAL | Status: DC | PRN

## 2017-10-22 MED ORDER — PROPOFOL 10 MG/ML IV BOLUS
INTRAVENOUS | Status: DC | PRN
  Administered 2017-10-22 (×2): 20 mg via INTRAVENOUS

## 2017-10-22 MED ORDER — HYDROCODONE-ACETAMINOPHEN 5-325 MG PO TABS: 1.0000 | ORAL_TABLET | Freq: Four times a day (QID) | ORAL | 0 refills | Status: DC | PRN

## 2017-10-22 MED ORDER — MIDAZOLAM HCL 5 MG/5ML IJ SOLN
INTRAMUSCULAR | Status: DC | PRN
  Administered 2017-10-22: 2 mg via INTRAVENOUS

## 2017-10-22 MED ORDER — VANCOMYCIN HCL IN DEXTROSE 1-5 GM/200ML-% IV SOLN
INTRAVENOUS | Status: AC
  Filled 2017-10-22: qty 200

## 2017-10-22 MED ORDER — CEFAZOLIN SODIUM-DEXTROSE 1-4 GM/50ML-% IV SOLN
INTRAVENOUS | Status: DC | PRN
  Administered 2017-10-22: 1 g via INTRAVENOUS

## 2017-10-22 MED ORDER — LIDOCAINE 2% (20 MG/ML) 5 ML SYRINGE
INTRAMUSCULAR | Status: AC
  Filled 2017-10-22: qty 5

## 2017-10-22 SURGICAL SUPPLY — 42 items
BLADE MINI RND TIP GREEN BEAV (BLADE) IMPLANT
BNDG COHESIVE 1X5 TAN STRL LF (GAUZE/BANDAGES/DRESSINGS) IMPLANT
BNDG COHESIVE 3X5 TAN STRL LF (GAUZE/BANDAGES/DRESSINGS) ×3 IMPLANT
BNDG ESMARK 4X9 LF (GAUZE/BANDAGES/DRESSINGS) ×3 IMPLANT
BNDG GAUZE ELAST 4 BULKY (GAUZE/BANDAGES/DRESSINGS) IMPLANT
CORDS BIPOLAR (ELECTRODE) ×3 IMPLANT
CUFF TOURNIQUET SINGLE 18IN (TOURNIQUET CUFF) ×3 IMPLANT
DECANTER SPIKE VIAL GLASS SM (MISCELLANEOUS) ×6 IMPLANT
DRAPE OEC MINIVIEW 54X84 (DRAPES) IMPLANT
DRSG KUZMA FLUFF (GAUZE/BANDAGES/DRESSINGS) IMPLANT
DURAPREP 26ML APPLICATOR (WOUND CARE) ×3 IMPLANT
GAUZE SPONGE 2X2 8PLY STRL LF (GAUZE/BANDAGES/DRESSINGS) IMPLANT
GAUZE SPONGE 4X4 12PLY STRL (GAUZE/BANDAGES/DRESSINGS) ×3 IMPLANT
GAUZE XEROFORM 1X8 LF (GAUZE/BANDAGES/DRESSINGS) ×3 IMPLANT
GLOVE BIO SURGEON STRL SZ 6.5 (GLOVE) ×2 IMPLANT
GLOVE BIO SURGEONS STRL SZ 6.5 (GLOVE) ×1
GLOVE SURG ORTHO 8.0 STRL STRW (GLOVE) ×3 IMPLANT
GOWN STRL REUS W/ TWL LRG LVL3 (GOWN DISPOSABLE) ×2 IMPLANT
GOWN STRL REUS W/ TWL XL LVL3 (GOWN DISPOSABLE) ×1 IMPLANT
GOWN STRL REUS W/TWL LRG LVL3 (GOWN DISPOSABLE) ×4
GOWN STRL REUS W/TWL XL LVL3 (GOWN DISPOSABLE) ×2
KIT BASIN OR (CUSTOM PROCEDURE TRAY) ×3 IMPLANT
KIT TURNOVER KIT B (KITS) ×3 IMPLANT
MANIFOLD NEPTUNE II (INSTRUMENTS) IMPLANT
NEEDLE HYPO 25GX1X1/2 BEV (NEEDLE) IMPLANT
NS IRRIG 1000ML POUR BTL (IV SOLUTION) ×3 IMPLANT
PACK ORTHO EXTREMITY (CUSTOM PROCEDURE TRAY) ×3 IMPLANT
PAD ARMBOARD 7.5X6 YLW CONV (MISCELLANEOUS) ×6 IMPLANT
PAD CAST 4YDX4 CTTN HI CHSV (CAST SUPPLIES) IMPLANT
PADDING CAST COTTON 4X4 STRL (CAST SUPPLIES)
RUBBERBAND STERILE (MISCELLANEOUS) ×3 IMPLANT
SPECIMEN JAR SMALL (MISCELLANEOUS) IMPLANT
SPONGE GAUZE 2X2 STER 10/PKG (GAUZE/BANDAGES/DRESSINGS)
SUT ETHILON 4 0 PS 2 18 (SUTURE) ×3 IMPLANT
SUT ETHILON 5 0 PS 2 18 (SUTURE) IMPLANT
SUT SILK 4 0 PS 2 (SUTURE) IMPLANT
SUT VICRYL 4-0 PS2 18IN ABS (SUTURE) IMPLANT
SYR CONTROL 10ML LL (SYRINGE) IMPLANT
TOWEL OR 17X24 6PK STRL BLUE (TOWEL DISPOSABLE) ×3 IMPLANT
TOWEL OR 17X26 10 PK STRL BLUE (TOWEL DISPOSABLE) ×3 IMPLANT
UNDERPAD 30X30 (UNDERPADS AND DIAPERS) ×3 IMPLANT
WATER STERILE IRR 1000ML POUR (IV SOLUTION) IMPLANT

## 2017-10-22 NOTE — Anesthesia Preprocedure Evaluation (Signed)
Anesthesia Evaluation  Patient identified by MRN, date of birth, ID band Patient awake    Reviewed: Allergy & Precautions, NPO status , Patient's Chart, lab work & pertinent test results, reviewed documented beta blocker date and time   History of Anesthesia Complications Negative for: history of anesthetic complications  Airway Mallampati: II  TM Distance: >3 FB Neck ROM: Full    Dental no notable dental hx. (+) Dental Advisory Given   Pulmonary asthma , former smoker,    Pulmonary exam normal breath sounds clear to auscultation       Cardiovascular hypertension, Pt. on home beta blockers + CAD, + Past MI and +CHF  negative cardio ROS Normal cardiovascular exam Rhythm:Regular Rate:Normal     Neuro/Psych  Headaches, TIACVA, Residual Symptoms negative psych ROS   GI/Hepatic Neg liver ROS, GERD  ,  Endo/Other  diabetesHypothyroidism   Renal/GU Renal disease     Musculoskeletal negative musculoskeletal ROS (+) Arthritis ,   Abdominal   Peds  Hematology negative hematology ROS (+) anemia ,   Anesthesia Other Findings Day of surgery medications reviewed with the patient.  Reproductive/Obstetrics                             Anesthesia Physical  Anesthesia Plan  ASA: IV  Anesthesia Plan: Regional and MAC   Post-op Pain Management:    Induction: Intravenous  PONV Risk Score and Plan: 2 and Ondansetron and Midazolam  Airway Management Planned: Simple Face Mask  Additional Equipment:   Intra-op Plan:   Post-operative Plan:   Informed Consent: I have reviewed the patients History and Physical, chart, labs and discussed the procedure including the risks, benefits and alternatives for the proposed anesthesia with the patient or authorized representative who has indicated his/her understanding and acceptance.   Dental advisory given  Plan Discussed with: CRNA and  Anesthesiologist  Anesthesia Plan Comments:         Anesthesia Quick Evaluation

## 2017-10-22 NOTE — H&P (Signed)
Roberta Bryant is an 67 y.o. female.   Chief Complaint: catching right index fingerHPI: Roberta Bryant is a 67 yo with  triggering right index finger. She was last seen on 07/21/2017. This continues to bother her with catching. It is mildly painful for her. She is seeing Dr. Marlou Sa for the possibility of a shoulder replacement arthroplasty to the right shoulder. The trigger finger has been injected on 2 occasions. She is post carpal tunnel releases. She has a positive history for diabetes thyroid problems arthritis and negative for gout. Family history is negative for these except gout which is positive..      Past Medical History:  Diagnosis Date  . Anemia   . Aortic stenosis   . CHF (congestive heart failure) ([place])   . Childhood asthma   . Chronic upper back pain   . Coronary artery disease    Stent to left main coronary artery second of August 2017  . Decreased hearing   . ESRD (end stage renal disease) on dialysis ([place])    "TTS; [facility] in [place]" (06/22/2017)  . Gait abnormality 08/28/2016  . GERD (gastroesophageal reflux disease)   . Headache    "a few times/week" (10/30/2014)  . Headache syndrome 01/08/2017  . Heart murmur   . History of blood transfusion 1996   "related to menses"  . Hyperlipidemia   . Hypertension   . Hypothyroidism   . IBS (irritable bowel syndrome)   . Myocardial infarction ([place]) 2004  . Osteoarthritis   . Pneumonia    "years ago"  . Spondylosis of cervical spine 06/16/2017  . Stroke ([place]) 07/2016   mini stroke , right side of body weak-   . Type II diabetes mellitus ([place])     Past Surgical History:  Procedure Laterality Date  . APPENDECTOMY    . AV FISTULA PLACEMENT Left 12/03/2006   "forearm"  . AV FISTULA REPAIR Left 11/25/2009   "forearm"  . CARPAL TUNNEL RELEASE Right 02/11/2017   Procedure: RIGHT CARPAL TUNNEL RELEASE;  Surgeon: Daryll Brod, MD;  Location: [facility];  Service: Orthopedics;  Laterality: Right;  . CHOLECYSTECTOMY  OPEN  1970's  . COLONOSCOPY W/ POLYPECTOMY    . CORONARY ANGIOPLASTY    . CORONARY ANGIOPLASTY WITH STENT PLACEMENT    . INSERTION OF DIALYSIS CATHETER Right 2010   "chest"  . PARATHYROIDECTOMY  2010?   Parathyroid autotransplantation   . REVISON OF ARTERIOVENOUS FISTULA Left 12/04/2013   Procedure: REVISON OF ARTERIOVENOUS FISTULA;  Surgeon: Rosetta Posner, MD;  Location: [facility];  Service: Vascular;  Laterality: Left;  . REVISON OF ARTERIOVENOUS FISTULA Left 06/13/1094   Procedure: PLICATION OF LEFT BRACHIOCEPHALIC  ARTERIOVENOUS FISTULA;  Surgeon: Conrad Leary, MD;  Location: [facility];  Service: Vascular;  Laterality: Left;  . SHOULDER ARTHROSCOPY W/ ROTATOR CUFF REPAIR Right 06/22/2017   WITH EXTENSIVE DEBRIDEMENT  . SHOULDER ARTHROSCOPY WITH ROTATOR CUFF REPAIR AND SUBACROMIAL DECOMPRESSION Right 06/22/2017   Procedure: RIGHT SHOULDER ARTHROSCOPY WITH EXTENSIVE DEBRIDEMENT;  Surgeon: Mcarthur Rossetti, MD;  Location: [facility];  Service: Orthopedics;  Laterality: Right;  . TUBAL LIGATION  1989  . UMBILICAL HERNIA REPAIR  1990's X 2    Family History  Problem Relation Age of Onset  . Epilepsy Mother   . Diabetes Mother   . Cancer Mother   . Alcohol abuse Father    Social History:  reports that she has quit smoking. Her smoking use included cigarettes. She quit after 3.00 years of use.  She has never used smokeless tobacco. She reports that she has current or past drug history. Drugs: Marijuana and Hydromorphone. She reports that she does not drink alcohol.  Allergies:  Allergies  Allergen Reactions  . Sulfa Antibiotics Anaphylaxis  . Amoxicillin Hives    Has patient had a PCN reaction causing immediate rash, facial/tongue/throat swelling, SOB or lightheadedness with hypotension: No Has patient had a PCN reaction causing severe rash involving mucus membranes or skin necrosis: No Has patient had a PCN reaction that required hospitalization: No Has patient had a PCN reaction occurring  within the last 10 years: Unknown If all of the above answers are "NO", then may proceed with Cephalosporin use.   Marland Kitchen Crestor [Rosuvastatin Calcium] Other (See Comments)    Dizziness, couldn't walk  . Ibuprofen Hives  . Naldecon Senior [employer]  . Pravastatin Hives and Other (See Comments)    UNSPECIFIED REACTION   . Statins Hives  . Chlorphen-Phenyleph-Asa Rash  . Gabapentin Other (See Comments)    Dizzy    No medications prior to admission.    No results found for this or any previous visit (from the past 48 hour(s)).  No results found.   Pertinent items are noted in HPI.  There were no vitals taken for this visit.  General appearance: alert, cooperative and appears stated age Head: Normocephalic, without obvious abnormality Neck: no JVD Resp: clear to auscultation bilaterally Cardio: regular rate and rhythm, S1, S2 normal, no murmur, click, rub or gallop GI: soft, non-tender; bowel sounds normal; no masses,  no organomegaly Extremities: catching right index finger Pulses: 2+ and symmetric Skin: Skin color, texture, turgor normal. No rashes or lesions Neurologic: Grossly normal Incision/Wound: na  Assessment/Plan Assessment:  1. Trigger index finger of right hand    Plan: He is seen with her son. We have discussed doing her right index finger release. She is wondering about waiting until after she has her shoulder done. We have recommended that she have the finger done if this heals without any infectious process then her shoulder can be done without worry of an infection following her surgery on her index finger if it would be done second. We have discussed with her that the odds of infection are minimal but that this would be a significant problem for her to total shoulder should occur. Would like to proceed she is scheduled for release A1 pulley right index finger as an outpatient under regional anesthesia.      Deveon Kisiel R 10/22/2017, 8:48  AM

## 2017-10-22 NOTE — Discharge Instructions (Addendum)

## 2017-10-22 NOTE — Final Progress Note (Signed)
Dr Sabra Heck gave order to access lt foot for iv access.

## 2017-10-22 NOTE — Op Note (Signed)
Preoperative diagnosis stenosing tenosynovitis right index finger  Postoperative diagnosis: Same  Operation: Release A1 pulley right index finger  Surgeon: Daryll Brod  Anesthesia: IV sedation local infiltration  Place surgery [facility]  History the patient is a 66 year old female with a history of trigger of her right index finger.  Is not responded to conservative treatment.  She has elected to undergo surgical release the A1 pulley.  Pre-peri-and postoperative course been discussed along with the risk complications.  She is aware there is no guarantee to the surgery the possibility of infection recurrence injury to arteries nerves tendons and complete relief symptoms and dystrophy.  Preoperative area the patient is seen extremity marked by both patient and surgeon antibiotic given   procedure: Patient brought to the operating room where a she was prepped and draped using ChloraPrep in the supine position with the right arm free.  A three-minute dry time was allowed timeout taken to confirm patient procedure.  A IV sedation was given in the local infiltration Cortisporin 2 bupivacaine 1% Xylocaine buffered without epinephrine were given to the local area of surgery.  Over the met this was over the metacarpal phalangeal joint volar aspect right index finger.  The limb was exsanguinated with an Esmarch bandage turn placed on the form was inflated to 200 mmHg.  An oblique incision was made over the A1 pulley of the right index finger.  This carried down through subcutaneous tissue.  Bleeders were electrocauterized with bipolar.  Dissection was carried down to the A1 pulley with retractors placed protecting the neurovascular bundle radial neurovascular bundle ulnarly.  The A1 pulley was thick this was released on its radial aspect a small incision was made centrally and A2.  Tenosynovial tissue proximally was separated with blunt dissection.  The 2 tendons were then separated breaking any  adhesions between the tenosynovial tissue of the 2 tendons.  Finger placed through full passive range of motion.  No further trigger was noted.  The wound was irrigated with saline.  The skin was closed with interrupted 4-0 nylon sutures.  A sterile compressive dressing with a finger screw was applied.  Inflation of the tourniquet all fingers immediately pink.  She was taken to the recovery room for observation in satisfactory condition.  She will be discharged home to return [place] in 1 week on Norco.

## 2017-10-22 NOTE — Brief Op Note (Signed)
10/22/2017  5:27 PM  PATIENT:  Roberta Bryant  67 y.o. female  PRE-OPERATIVE DIAGNOSIS:  TRIGGER RIGHT INDEX FINGER  POST-OPERATIVE DIAGNOSIS:  TRIGGER RIGHT INDEX FINGER  PROCEDURE:  Procedure(s): RELEASE RIGHT INDEX TRIGGER FINGER/A-1 PULLEY (Right)  SURGEON:  Surgeon(s) and Role:    * Daryll Brod, MD - Primary  PHYSICIAN ASSISTANT:   ASSISTANTS: none   ANESTHESIA:   local and IV sedation  EBL: none   BLOOD ADMINISTERED:none  DRAINS: none   LOCAL MEDICATIONS USED:  BUPIVICAINE  and XYLOCAINE   SPECIMEN:  No Specimen  DISPOSITION OF SPECIMEN:  N/A  COUNTS:  YES  TOURNIQUET:   Total Tourniquet Time Documented: Forearm (Right) - 7 minutes Total: Forearm (Right) - 7 minutes   DICTATION: .Dragon Dictation  PLAN OF CARE: Discharge to home after PACU  PATIENT DISPOSITION:  PACU - hemodynamically stable.

## 2017-10-22 NOTE — Transfer of Care (Signed)
Immediate Anesthesia Transfer of Care Note  Patient: Roberta Bryant  Procedure(s) Performed: RELEASE RIGHT INDEX TRIGGER FINGER/A-1 PULLEY (Right Hand)  Patient Location: PACU  Anesthesia Type:MAC  Level of Consciousness: awake and patient cooperative  Airway & Oxygen Therapy: Patient Spontanous Breathing  Post-op Assessment: Report given to RN and Post -op Vital signs reviewed and stable  Post vital signs: Reviewed and stable  Last Vitals:  Vitals Value Taken Time  BP 169/84 10/22/2017  5:36 PM  Temp    Pulse 78 10/22/2017  5:41 PM  Resp 13 10/22/2017  5:41 PM  SpO2 99 % 10/22/2017  5:41 PM  Vitals shown include unvalidated device data.  Last Pain:  Vitals:   10/22/17 1245  TempSrc: Oral         Complications: No apparent anesthesia complications

## 2017-10-22 NOTE — Anesthesia Procedure Notes (Signed)
Procedure Name: MAC Date/Time: 10/22/2017 5:04 PM Performed by: Lance Coon, CRNA Pre-anesthesia Checklist: Patient identified, Emergency Drugs available, Suction available, Patient being monitored and Timeout performed Patient Re-evaluated:Patient Re-evaluated prior to induction Oxygen Delivery Method: Simple face mask

## 2017-10-22 NOTE — Progress Notes (Signed)
Pt. Didn't take beta blocker since Wed. Night. Notified Dr. Sabra Heck, no new orders.

## 2017-10-23 NOTE — Anesthesia Postprocedure Evaluation (Signed)
Anesthesia Post Note  Patient: Roberta Bryant  Procedure(s) Performed: RELEASE RIGHT INDEX TRIGGER FINGER/A-1 PULLEY (Right Hand)     Patient location during evaluation: PACU Anesthesia Type: Regional Level of consciousness: awake and alert Pain management: pain level controlled Vital Signs Assessment: post-procedure vital signs reviewed and stable Respiratory status: spontaneous breathing, nonlabored ventilation, respiratory function stable and patient connected to nasal cannula oxygen Cardiovascular status: stable and blood pressure returned to baseline Postop Assessment: no apparent nausea or vomiting Anesthetic complications: no    Last Vitals:  Vitals:   10/22/17 1750 10/22/17 1805  BP: 133/63 (!) 127/59  Pulse: 78 71  Resp: 18 12  Temp:  36.4 C  SpO2: 100% 100%    Last Pain:  Vitals:   10/22/17 1805  TempSrc:   PainSc: 0-No pain                 [name]

## 2017-10-24 ENCOUNTER — Encounter (HOSPITAL_COMMUNITY): Payer: Self-pay | Admitting: Orthopedic Surgery

## 2017-10-25 ENCOUNTER — Encounter: Payer: Self-pay | Admitting: Cardiology

## 2017-10-25 ENCOUNTER — Encounter: Payer: Self-pay | Admitting: *Deleted

## 2017-10-25 ENCOUNTER — Ambulatory Visit (INDEPENDENT_AMBULATORY_CARE_PROVIDER_SITE_OTHER): Payer: Medicare Other | Admitting: Cardiology

## 2017-10-25 VITALS — HR 68 | Ht 59.0 in | Wt 186.0 lb

## 2017-10-25 DIAGNOSIS — I10 Essential (primary) hypertension: Secondary | ICD-10-CM | POA: Diagnosis not present

## 2017-10-25 DIAGNOSIS — I350 Nonrheumatic aortic (valve) stenosis: Secondary | ICD-10-CM

## 2017-10-25 DIAGNOSIS — I2510 Atherosclerotic heart disease of native coronary artery without angina pectoris: Secondary | ICD-10-CM

## 2017-10-25 NOTE — Progress Notes (Signed)
Cardiology Office Note:    Date:  10/25/2017   ID:  Roberta Bryant, DOB March 18, 1951, MRN [id number]  PCP:  Alanson Puls, [facility]  Cardiologist:  [name], MD    Referring MD: Alanson Puls, [facility] *   Chief Complaint  Patient presents with  . Follow-up  . Hospitalization Follow-up  I had trigger finger surgery  History of Present Illness:    Roberta Bryant is a 67 y.o. female with multiple medical problems including coronary artery disease status post PTCA and drug-eluting stent to left main coronary artery in summer 2017.  Recent stress test in March negative for exercise-induced myocardial ischemia.  Recent echocardiogram showed ejection fraction 45% with noncritical aortic stenosis.  Roberta Bryant went to trigger finger surgery with no difficulties complaining of having some swelling of her hand but otherwise doing well about a month ago she ended up going to the emergency because of atypical chest pain.  Got frustrated while waiting and decided to go home.  Is that time no chest pain.  Complaint of being weak tired especially the day of dialysis.  Past Medical History:  Diagnosis Date  . Anemia   . Aortic stenosis   . CHF (congestive heart failure) ([place])   . Childhood asthma   . Chronic upper back pain   . Coronary artery disease    Stent to left main coronary artery second of August 2017  . Decreased hearing   . ESRD (end stage renal disease) on dialysis ([place])    "TTS; [facility] in [place]" (06/22/2017)  . Gait abnormality 08/28/2016  . GERD (gastroesophageal reflux disease)   . Headache    "a few times/week" (10/30/2014)  . Headache syndrome 01/08/2017  . Heart murmur   . History of blood transfusion 1996   "related to menses"  . Hyperlipidemia   . Hypertension   . Hypothyroidism   . IBS (irritable bowel syndrome)   . Myocardial infarction ([place]) 2004  . Osteoarthritis   . Pneumonia    "years ago"  . Spondylosis of cervical spine 06/16/2017  .  Stroke ([place]) 07/2016   mini stroke , right side of body weak-   . Type II diabetes mellitus ([place])     Past Surgical History:  Procedure Laterality Date  . APPENDECTOMY    . AV FISTULA PLACEMENT Left 12/03/2006   "forearm"  . AV FISTULA REPAIR Left 11/25/2009   "forearm"  . CARPAL TUNNEL RELEASE Right 02/11/2017   Procedure: RIGHT CARPAL TUNNEL RELEASE;  Surgeon: Daryll Brod, MD;  Location: [facility];  Service: Orthopedics;  Laterality: Right;  . CHOLECYSTECTOMY OPEN  1970's  . COLONOSCOPY W/ POLYPECTOMY    . CORONARY ANGIOPLASTY    . CORONARY ANGIOPLASTY WITH STENT PLACEMENT    . INSERTION OF DIALYSIS CATHETER Right 2010   "chest"  . PARATHYROIDECTOMY  2010?   Parathyroid autotransplantation   . REVISON OF ARTERIOVENOUS FISTULA Left 12/04/2013   Procedure: REVISON OF ARTERIOVENOUS FISTULA;  Surgeon: Rosetta Posner, MD;  Location: [facility];  Service: Vascular;  Laterality: Left;  . REVISON OF ARTERIOVENOUS FISTULA Left 01/14/2118   Procedure: PLICATION OF LEFT BRACHIOCEPHALIC  ARTERIOVENOUS FISTULA;  Surgeon: Conrad Corcoran, MD;  Location: [facility];  Service: Vascular;  Laterality: Left;  . SHOULDER ARTHROSCOPY W/ ROTATOR CUFF REPAIR Right 06/22/2017   WITH EXTENSIVE DEBRIDEMENT  . SHOULDER ARTHROSCOPY WITH ROTATOR CUFF REPAIR AND SUBACROMIAL DECOMPRESSION Right 06/22/2017   Procedure: RIGHT SHOULDER ARTHROSCOPY WITH EXTENSIVE DEBRIDEMENT;  Surgeon: Jean Rosenthal  Y, MD;  Location: [facility];  Service: Orthopedics;  Laterality: Right;  . TRIGGER FINGER RELEASE Right 10/22/2017   Procedure: RELEASE RIGHT INDEX TRIGGER FINGER/A-1 PULLEY;  Surgeon: Daryll Brod, MD;  Location: [facility];  Service: Orthopedics;  Laterality: Right;  . TUBAL LIGATION  1989  . UMBILICAL HERNIA REPAIR  1990's X 2    Current Medications: Current Meds  Medication Sig  . acetaminophen (TYLENOL) 325 MG tablet Take 650 mg by mouth every 6 (six) hours as needed for moderate pain or headache.   Marland Kitchen aspirin EC 81  MG tablet Take 81 mg by mouth daily.   . B Complex-C-Zn-Folic Acid (DIALYVITE 662 WITH ZINC) 0.8 MG TABS Take 1 tablet by mouth daily.  . carvedilol (COREG) 12.5 MG tablet Take 25 mg by mouth at bedtime.   . clonazePAM (KLONOPIN) 0.5 MG tablet TAKE 1 TABLET BY MOUTH AT BEDTIME  . dimenhyDRINATE (DRAMAMINE) 50 MG tablet Take 50 mg by mouth every 8 (eight) hours as needed (for dizziness/vertigo).  Marland Kitchen docusate sodium (COLACE) 100 MG capsule Take 100 mg by mouth 2 (two) times daily.  Marland Kitchen ezetimibe (ZETIA) 10 MG tablet Take 10 mg by mouth daily.  . fenofibrate 160 MG tablet Take 160 mg by mouth daily.  . finasteride (PROSCAR) 5 MG tablet Take 2.5 mg by mouth daily with lunch.   Marland Kitchen HYDROcodone-acetaminophen (NORCO) 5-325 MG tablet Take 1 tablet by mouth every 6 (six) hours as needed for moderate pain.  Marland Kitchen lanthanum (FOSRENOL) 1000 MG chewable tablet Chew 2,000 mg by mouth 3 (three) times daily after meals.   Marland Kitchen levothyroxine (SYNTHROID, LEVOTHROID) 50 MCG tablet Take 50 mcg by mouth daily before breakfast.   . lidocaine-prilocaine (EMLA) cream Apply 1 application topically See admin instructions. Apply a small amount to access site 1-2 hours before dialysis--cover with occlusive dressing  . liraglutide (VICTOZA) 18 MG/3ML SOPN Inject 1.8 mg into the skin daily.   Marland Kitchen loperamide (IMODIUM A-D) 2 MG tablet Take 2 mg by mouth Every Tuesday,Thursday,and Saturday with dialysis. Take 1 tablet (2 mg) by mouth on Tuesdays, Thursdays, and Saturdays Before dialysis & as needed for diarrhea.  . nortriptyline (PAMELOR) 10 MG capsule Take one capsule at night for one week, then take 2 capsules at night (Patient taking differently: Take 10 mg by mouth at bedtime. )  . omeprazole (PRILOSEC) 20 MG capsule Take 20 mg by mouth 2 (two) times daily before a meal.  . Oxycodone HCl 10 MG TABS Take 10 mg by mouth 3 (three) times daily as needed (for pain.).   Marland Kitchen sitaGLIPtin (JANUVIA) 25 MG tablet Take 25 mg by mouth daily with lunch.     . Turmeric Curcumin 500 MG CAPS Take 500 mg by mouth daily with lunch.  . Vitamin D, Ergocalciferol, (DRISDOL) 50000 units CAPS capsule Take 50,000 Units by mouth every 30 (thirty) days.  . Zinc 50 MG CAPS Take 50 mg by mouth daily with lunch.      Allergies:   Sulfa antibiotics; Amoxicillin; Crestor [rosuvastatin calcium]; Ibuprofen; Naldecon senior [guaifenesin]; Pravastatin; Statins; Chlorphen-phenyleph-asa; and Gabapentin   Social History   Socioeconomic History  . Marital status: Divorced    Spouse name: Not on file  . Number of children: 4  . Years of education: Some college  . Highest education level: Not on file  Occupational History  . Occupation: Disabled  Social Needs  . Financial resource strain: Not on file  . Food insecurity:    Worry: Not on file  Inability: Not on file  . Transportation needs:    Medical: Not on file    Non-medical: Not on file  Tobacco Use  . Smoking status: Former Smoker    Years: 3.00    Types: Cigarettes  . Smokeless tobacco: Never Used  . Tobacco comment: "stopped smoking in the 1970's"  Substance and Sexual Activity  . Alcohol use: No  . Drug use: Yes    Types: Marijuana, Hydromorphone    Comment: as a teenager  . Sexual activity: Not Currently  Lifestyle  . Physical activity:    Days per week: Not on file    Minutes per session: Not on file  . Stress: Not on file  Relationships  . Social connections:    Talks on phone: Not on file    Gets together: Not on file    Attends religious service: Not on file    Active member of club or organization: Not on file    Attends meetings of clubs or organizations: Not on file    Relationship status: Not on file  Other Topics Concern  . Not on file  Social History Narrative   Lives at home with sons   Caffeine use: Coffee-decaf   Right-handed     Family History: The patient's family history includes Alcohol abuse in her father; Cancer in her mother; Diabetes in her mother;  Epilepsy in her mother. ROS:   Please see the history of present illness.    All 14 point review of systems negative except as described per history of present illness  EKGs/Labs/Other Studies Reviewed:      Recent Labs: 05/28/2017: ALT 15 10/15/2017: BUN 18; Creatinine, Ser 6.25; Platelets 199 10/22/2017: Hemoglobin 12.9; Potassium 4.5; Sodium 140  Recent Lipid Panel    Component Value Date/Time   CHOL 188 12/23/2016 0912   TRIG 218 (H) 12/23/2016 0912   HDL 44 12/23/2016 0912   CHOLHDL 4.3 12/23/2016 0912   CHOLHDL 8.4 11/24/2015 0311   VLDL UNABLE TO CALCULATE IF TRIGLYCERIDE OVER 400 mg/dL 11/24/2015 0311   LDLCALC 100 (H) 12/23/2016 0912    Physical Exam:    VS:  Pulse 68   Ht 4\' 11"  (1.499 m)   Wt 186 lb (84.4 kg)   SpO2 98%   BMI 37.57 kg/m     Wt Readings from Last 3 Encounters:  10/25/17 186 lb (84.4 kg)  10/15/17 190 lb 4.1 oz (86.3 kg)  10/03/17 187 lb (84.8 kg)     GEN:  Well nourished, well developed in no acute distress HEENT: Normal NECK: No JVD; No carotid bruits LYMPHATICS: No lymphadenopathy CARDIAC: RRR, continuous murmur with ejection systolic murmur best heard at the right upper portion of the sternum grade 2/6 to 3/6., no rubs, no gallops RESPIRATORY:  Clear to auscultation without rales, wheezing or rhonchi  ABDOMEN: Soft, non-tender, non-distended MUSCULOSKELETAL:  No edema; No deformity  SKIN: Warm and dry LOWER EXTREMITIES: no swelling NEUROLOGIC:  Alert and oriented x 3 PSYCHIATRIC:  Normal affect   ASSESSMENT:    1. Nonrheumatic aortic valve stenosis   2. Coronary artery disease involving native coronary artery of native heart without angina pectoris   3. Essential hypertension    PLAN:    In order of problems listed above:  1. Nonrheumatic aortic valve stenosis: Noncritical continue present management echocardiogram will be repeated in June meaning next month 2. Coronary artery disease: Stable recent stress test  negative. 3. Essential hypertension we have difficulty checking her blood pressure  today left arm got the shunt right arm just had a surgery.  Overall she is doing well and my complaining of any specific issues.  I will contact her primary care physician to get fasting lipid profile see her back in my office in about 3 months echocardiogram will be repeated at that time   Medication Adjustments/Labs and Tests Ordered: Current medicines are reviewed at length with the patient today.  Concerns regarding medicines are outlined above.  No orders of the defined types were placed in this encounter.  Medication changes: No orders of the defined types were placed in this encounter.   Signed, Park Liter, MD, [facility] 10/25/2017 8:30 AM    [place]

## 2017-10-25 NOTE — Patient Instructions (Signed)
Medication Instructions:  Your physician recommends that you continue on your current medications as directed. Please refer to the Current Medication list given to you today.   Labwork: None  Testing/Procedures: Your physician has requested that you have an echocardiogram. Echocardiography is a painless test that uses sound waves to create images of your heart. It provides your doctor with information about the size and shape of your heart and how well your heart's chambers and valves are working. This procedure takes approximately one hour. There are no restrictions for this procedure.  Follow-Up: Your physician wants you to follow-up in: 3 months. You will receive a reminder letter in the mail two months in advance. If you don't receive a letter, please call our office to schedule the follow-up appointment.   If you need a refill on your cardiac medications before your next appointment, please call your pharmacy.   Thank you for choosing [facility]! Catherine Lockhart, RN [phone]    

## 2017-10-27 ENCOUNTER — Other Ambulatory Visit (INDEPENDENT_AMBULATORY_CARE_PROVIDER_SITE_OTHER): Payer: Self-pay

## 2017-12-03 ENCOUNTER — Other Ambulatory Visit (INDEPENDENT_AMBULATORY_CARE_PROVIDER_SITE_OTHER): Payer: Self-pay | Admitting: Orthopedic Surgery

## 2017-12-03 DIAGNOSIS — M12811 Other specific arthropathies, not elsewhere classified, right shoulder: Secondary | ICD-10-CM

## 2017-12-07 ENCOUNTER — Other Ambulatory Visit: Payer: Self-pay | Admitting: Neurology

## 2017-12-07 ENCOUNTER — Telehealth: Payer: Self-pay | Admitting: Cardiology

## 2017-12-07 NOTE — Telephone Encounter (Signed)
Patient was calling to discuss whether she needed another echo. One is scheduled at [facility] in July.

## 2017-12-07 NOTE — Telephone Encounter (Signed)
Left voicemail for the patient to call the office to discuss. 

## 2017-12-08 NOTE — Telephone Encounter (Signed)
Returned call

## 2017-12-08 NOTE — Telephone Encounter (Signed)
Echo was completed in March of this year, not sure as to why another is ordered. Will confer with Dr. Agustin Cree.

## 2017-12-10 NOTE — Telephone Encounter (Signed)
Left voicemail for the patient to maintain her current echo appointment.

## 2017-12-13 ENCOUNTER — Inpatient Hospital Stay (HOSPITAL_COMMUNITY): Admission: RE | Admit: 2017-12-13 | Payer: Medicare Other | Source: Ambulatory Visit

## 2017-12-13 NOTE — Pre-Procedure Instructions (Signed)
Roberta Bryant  12/13/2017      [place], [address] [place] [address] Phone: [phone] Fax: [phone]    Your procedure is scheduled on July 16  Report to [facility] Admitting at 10:15 A.M.  Call this number if you have problems the morning of surgery:  [phone]   Remember:   Do not eat or drink after midnight.               Take these medicines the morning of surgery with A SIP OF WATER : tylenol if needed, carvedilol (coreg), finasteride (proscar),hydrocodone if needed, levothyroxine (synthroid), omeprazole (prilosec),               7 days prior to surgery STOP taking  Aleve, Naproxen, Ibuprofen, Motrin, Advil, Goody's, BC's, all herbal medications, fish oil, and all vitamins               Follow your doctors instructions regarding your Aspirin.  If no instructions were given by your doctor, then you will need to call the prescribing office office to get instructions.                    How to Manage Your Diabetes Before and After Surgery  Why is it important to control my blood sugar before and after surgery? . Improving blood sugar levels before and after surgery helps healing and can limit problems. . A way of improving blood sugar control is eating a healthy diet by: o  Eating less sugar and carbohydrates o  Increasing activity/exercise o  Talking with your doctor about reaching your blood sugar goals . High blood sugars (greater than 180 mg/dL) can raise your risk of infections and slow your recovery, so you will need to focus on controlling your diabetes during the weeks before surgery. . Make sure that the doctor who takes care of your diabetes knows about your planned surgery including the date and location.  How do I manage my blood sugar before surgery? . Check your blood sugar at least 4 times a day, starting 2 days before surgery, to make sure that the level is not  too high or low. o Check your blood sugar the morning of your surgery when you wake up and every 2 hours until you get to the Short Stay unit. . If your blood sugar is less than 70 mg/dL, you will need to treat for low blood sugar: o Do not take insulin. o Treat a low blood sugar (less than 70 mg/dL) with  cup of clear juice (cranberry or apple), 4 glucose tablets, OR glucose gel. Recheck blood sugar in 15 minutes after treatment (to make sure it is greater than 70 mg/dL). If your blood sugar is not greater than 70 mg/dL on recheck, call [phone] o  for further instructions. . Report your blood sugar to the short stay nurse when you get to Short Stay.  . If you are admitted to the hospital after surgery: o Your blood sugar will be checked by the staff and you will probably be given insulin after surgery (instead of oral diabetes medicines) to make sure you have good blood sugar levels. o The goal for blood sugar control after surgery is 80-180 mg/dL.       WHAT DO I DO ABOUT MY DIABETES MEDICATION?   Marland Kitchen Do not take oral diabetes medicines (pills) the morning of surgery.   Marland Kitchen  The day of surgery, do not take other diabetes injectables, including Byetta (exenatide), Bydureon (exenatide ER), Victoza (liraglutide), or Trulicity (dulaglutide).       Do not wear jewelry, make-up or nail polish.  Do not wear lotions, powders, or perfumes, or deodorant.  Do not shave 48 hours prior to surgery.  Men may shave face and neck.  Do not bring valuables to the hospital.  [facility] is not responsible for any belongings or valuables.  Contacts, dentures or bridgework may not be worn into surgery.  Leave your suitcase in the car.  After surgery it may be brought to your room.  For patients admitted to the hospital, discharge time will be determined by your treatment team.  Patients discharged the day of surgery will not be allowed to drive home.    Special instructions:   [place]-  Preparing For Surgery  Before surgery, you can play an important role. Because skin is not sterile, your skin needs to be as free of germs as possible. You can reduce the number of germs on your skin by washing with CHG (chlorahexidine gluconate) Soap before surgery.  CHG is an antiseptic cleaner which kills germs and bonds with the skin to continue killing germs even after washing.    Oral Hygiene is also important to reduce your risk of infection.  Remember - BRUSH YOUR TEETH THE MORNING OF SURGERY WITH YOUR REGULAR TOOTHPASTE  Please do not use if you have an allergy to CHG or antibacterial soaps. If your skin becomes reddened/irritated stop using the CHG.  Do not shave (including legs and underarms) for at least 48 hours prior to first CHG shower. It is OK to shave your face.  Please follow these instructions carefully.   1. Shower the NIGHT BEFORE SURGERY and the MORNING OF SURGERY with CHG.   2. If you chose to wash your hair, wash your hair first as usual with your normal shampoo.  3. After you shampoo, rinse your hair and body thoroughly to remove the shampoo.  4. Use CHG as you would any other liquid soap. You can apply CHG directly to the skin and wash gently with a scrungie or a clean washcloth.   5. Apply the CHG Soap to your body ONLY FROM THE NECK DOWN.  Do not use on open wounds or open sores. Avoid contact with your eyes, ears, mouth and genitals (private parts). Wash Face and genitals (private parts)  with your normal soap.  6. Wash thoroughly, paying special attention to the area where your surgery will be performed.  7. Thoroughly rinse your body with warm water from the neck down.  8. DO NOT shower/wash with your normal soap after using and rinsing off the CHG Soap.  9. Pat yourself dry with a CLEAN TOWEL.  10. Wear CLEAN PAJAMAS to bed the night before surgery, wear comfortable clothes the morning of surgery  11. Place CLEAN SHEETS on your bed the night of your  first shower and DO NOT SLEEP WITH PETS.    Day of Surgery:  Do not apply any deodorants/lotions.  Please wear clean clothes to the hospital/surgery center.   Remember to brush your teeth WITH YOUR REGULAR TOOTHPASTE.    Please read over the following fact sheets that you were given. Coughing and Deep Breathing, MRSA Information and Surgical Site Infection Prevention

## 2017-12-14 ENCOUNTER — Other Ambulatory Visit: Payer: Self-pay

## 2017-12-14 ENCOUNTER — Encounter (HOSPITAL_COMMUNITY): Payer: Self-pay

## 2017-12-14 ENCOUNTER — Encounter (HOSPITAL_COMMUNITY)
Admission: RE | Admit: 2017-12-14 | Discharge: 2017-12-14 | Disposition: A | Discharge status: Home / Self Care | Payer: Medicare Other | Source: Ambulatory Visit | Attending: Orthopedic Surgery | Admitting: Orthopedic Surgery

## 2017-12-14 DIAGNOSIS — Z01812 Encounter for preprocedural laboratory examination: Secondary | ICD-10-CM | POA: Insufficient documentation

## 2017-12-14 HISTORY — DX: Other specific arthropathies, not elsewhere classified, right shoulder: M12.811

## 2017-12-14 LAB — BASIC METABOLIC PANEL
ANION GAP: 14 (ref 5–15)
BUN: 23 mg/dL (ref 8–23)
CHLORIDE: 99 mmol/L (ref 98–111)
CO2: 27 mmol/L (ref 22–32)
Calcium: 8.2 mg/dL — ABNORMAL LOW (ref 8.9–10.3)
Creatinine, Ser: 5.91 mg/dL — ABNORMAL HIGH (ref 0.44–1.00)
GFR calc Af Amer: 8 mL/min — ABNORMAL LOW (ref >60)
GFR calc non Af Amer: 7 mL/min — ABNORMAL LOW (ref >60)
GLUCOSE: 98 mg/dL (ref 70–99)
Potassium: 4.9 mmol/L (ref 3.5–5.1)
Sodium: 140 mmol/L (ref 135–145)

## 2017-12-14 LAB — URINALYSIS, MICROSCOPIC (REFLEX)

## 2017-12-14 LAB — CBC
HEMATOCRIT: 34.5 % — AB (ref 36.0–46.0)
Hemoglobin: 11.2 g/dL — ABNORMAL LOW (ref 12.0–15.0)
MCH: 36.8 pg — AB (ref 26.0–34.0)
MCHC: 32.5 g/dL (ref 30.0–36.0)
MCV: 113.5 fL — AB (ref 78.0–100.0)
Platelets: 202 10*3/uL (ref 150–400)
RBC: 3.04 MIL/uL — ABNORMAL LOW (ref 3.87–5.11)
RDW: 16.2 % — AB (ref 11.5–15.5)
WBC: 4.6 10*3/uL (ref 4.0–10.5)

## 2017-12-14 LAB — SURGICAL PCR SCREEN
MRSA, PCR: NEGATIVE
STAPHYLOCOCCUS AUREUS: NEGATIVE

## 2017-12-14 LAB — URINALYSIS, ROUTINE W REFLEX MICROSCOPIC
BILIRUBIN URINE: NEGATIVE
GLUCOSE, UA: NEGATIVE mg/dL
Ketones, ur: NEGATIVE mg/dL
NITRITE: NEGATIVE
Protein, ur: 100 mg/dL — AB
SPECIFIC GRAVITY, URINE: 1.01 (ref 1.005–1.030)

## 2017-12-14 LAB — GLUCOSE, CAPILLARY: GLUCOSE-CAPILLARY: 134 mg/dL — AB (ref 70–99)

## 2017-12-14 NOTE — Progress Notes (Signed)
Roberta Bryant            12/13/2017                          [place], [place] [place] [address] Phone: [phone] Fax: [phone]              Your procedure is scheduled on July 16            Report to [facility] Admitting at 10:15 A.M.            Call this number if you have problems the morning of surgery:            [phone]             Remember:             Do not eat or drink after midnight.                                             Take these medicines the morning of surgery with A SIP OF WATER :  Finasteride (PROSCAR), Levothyroxine (SYNTHROID, LEVOTHROID), and Omeprazole (PRILOSEC). If needed HYDROcodone-acetaminophen (NORCO)               7 days prior to surgery STOP taking  Aleve, Naproxen, Ibuprofen, Motrin, Advil, Goody's, BC's, all herbal medications, fish oil, and all vitamins             Follow your doctors instructions regarding your Aspirin.  If no instructions were given by your doctor, then you will need to call the prescribing office office to get instructions.    HOW TO MANAGE YOUR DIABETES BEFORE AND AFTER SURGERY  Why is it important to control my blood sugar before and after surgery?  Improving blood sugar levels before and after surgery helps healing and can limit problems.  A way of improving blood sugar control is eating a healthy diet by: ?  Eating less sugar and carbohydrates ?  Increasing activity/exercise ?  Talking with your doctor about reaching your blood sugar goals  High blood sugars (greater than 180 mg/dL) can raise your risk of infections and slow your recovery, so you will need to focus on controlling your diabetes during the weeks before surgery.  Make sure that the doctor who takes care of your diabetes knows about your planned surgery including the date and location.  How do I manage my blood sugar before surgery?  Check your blood  sugar at least 4 times a day, starting 2 days before surgery, to make sure that the level is not too high or low. ? Check your blood sugar the morning of your surgery when you wake up and every 2 hours until you get to the Short Stay unit.  If your blood sugar is less than 70 mg/dL, you will need to treat for low blood sugar: ? Do not take insulin. ? Treat a low blood sugar (less than 70 mg/dL) with  cup of clear juice (cranberry or apple), 4glucose tablets, OR glucose gel. Recheck blood sugar in 15 minutes after treatment (to make sure it is greater than 70 mg/dL). If your blood sugar is not greater than 70 mg/dL on recheck, call [phone] ?  for further instructions.  Report your blood sugar to the short stay nurse when you get to Short Stay.   If the blood sugar is 220mg /dl and above, call us at the provided number above.   If you are admitted to the hospital after surgery: ? Your blood sugar will be checked by the staff and you will probably be given insulin after surgery (instead of oral diabetes medicines) to make sure you have good blood sugar levels. ? The goal for blood sugar control after surgery is 80-180 mg/dL.   WHAT DO I DO ABOUT MY DIABETES MEDICATION?   Do not  take SitaGLIPtin (JANUVIA) the morning of surgery.   The day of surgery, do not take other diabetes injectables Victoza (liraglutide)              Do not wear jewelry, make-up or nail polish.            Do not wear lotions, powders, or perfumes, or deodorant.            Do not shave 48 hours prior to surgery.              Do not bring valuables to the hospital.            [facility] is not responsible for any belongings or valuables.  Contacts, dentures or bridgework may not be worn into surgery.  Leave your suitcase in the car.  After surgery it may be brought to your room.  For patients admitted to the hospital, discharge time will be determined by your treatment team.  Patients discharged  the day of surgery will not be allowed to drive home.    Special instructions:   [place]- Preparing For Surgery  Before surgery, you can play an important role. Because skin is not sterile, your skin needs to be as free of germs as possible. You can reduce the number of germs on your skin by washing with CHG (chlorahexidine gluconate) Soap before surgery.  CHG is an antiseptic cleaner which kills germs and bonds with the skin to continue killing germs even after washing.    Oral Hygiene is also important to reduce your risk of infection.  Remember - BRUSH YOUR TEETH THE MORNING OF SURGERY WITH YOUR REGULAR TOOTHPASTE  Please do not use if you have an allergy to CHG or antibacterial soaps. If your skin becomes reddened/irritated stop using the CHG.  Do not shave (including legs and underarms) for at least 48 hours prior to first CHG shower. It is OK to shave your face.  Please follow these instructions carefully.                                                                                                                     1. Shower the NIGHT BEFORE SURGERY and the MORNING OF SURGERY with CHG.   2. If you chose to wash your hair, wash your hair first as usual with your normal shampoo.  3. After you shampoo, rinse your  hair and body thoroughly to remove the shampoo.  4. Use CHG as you would any other liquid soap. You can apply CHG directly to the skin and wash gently with a scrungie or a clean washcloth.   5. Apply the CHG Soap to your body ONLY FROM THE NECK DOWN.  Do not use on open wounds or open sores. Avoid contact with your eyes, ears, mouth and genitals (private parts). Wash Face and genitals (private parts)  with your normal soap.  6. Wash thoroughly, paying special attention to the area where your surgery will be performed.  7. Thoroughly rinse your body with warm water from the neck down.  8. DO NOT shower/wash with your normal soap after using and rinsing  off the CHG Soap.  9. Pat yourself dry with a CLEAN TOWEL.  10. Wear CLEAN PAJAMAS to bed the night before surgery, wear comfortable clothes the morning of surgery  11. Place CLEAN SHEETS on your bed the night of your first shower and DO NOT SLEEP WITH PETS.  Day of Surgery:  Do not apply any deodorants/lotions.  Please wear clean clothes to the hospital/surgery center.   Remember to brush your teeth WITH YOUR REGULAR TOOTHPASTE.  Please read over the following fact sheets that you were given. Coughing and Deep Breathing, MRSA Information and Surgical Site Infection Prevention

## 2017-12-14 NOTE — Progress Notes (Signed)
PCP - [facility]  Cardiologist - Dr. [name]  Chest x-ray - 10/04/17 (E)  EKG - 10/04/17 (E)  Stress Test - 09/2017 (E)  ECHO - 08/2017 (E)  Cardiac Cath - 01/08/16 (E)  Sleep Study - Yes- Neg CPAP - None  LABS- 12/14/17: CBC, BMP, UA, UC  ASA- LD- 7/10  HA1C- 12/14/17 Fasting Blood Sugar - Today, 134 Checks Blood Sugar __0___ times a day- Pt sts she only gets her bs checked when she goes to dialysis M-W-F.  Anesthesia- Yes- EKG  Pt denies having chest pain, sob, or fever at this time. All instructions explained to the pt, with a verbal understanding of the material. Pt agrees to go over the instructions while at home for a better understanding. The opportunity to ask questions was provided.

## 2017-12-15 ENCOUNTER — Other Ambulatory Visit (INDEPENDENT_AMBULATORY_CARE_PROVIDER_SITE_OTHER): Payer: Self-pay

## 2017-12-15 ENCOUNTER — Encounter (HOSPITAL_COMMUNITY): Payer: Self-pay | Admitting: Emergency Medicine

## 2017-12-15 ENCOUNTER — Telehealth (INDEPENDENT_AMBULATORY_CARE_PROVIDER_SITE_OTHER): Payer: Self-pay | Admitting: Orthopedic Surgery

## 2017-12-15 ENCOUNTER — Telehealth: Payer: Self-pay | Admitting: Cardiology

## 2017-12-15 LAB — HEMOGLOBIN A1C
HEMOGLOBIN A1C: 4.5 % — AB (ref 4.8–5.6)
MEAN PLASMA GLUCOSE: 82 mg/dL

## 2017-12-15 MED ORDER — CIPROFLOXACIN HCL 500 MG PO TABS: ORAL_TABLET | ORAL | 0 refills | Status: DC

## 2017-12-15 NOTE — Telephone Encounter (Signed)
We spoke about this patient the other day, please advise what you would prefer to do.

## 2017-12-15 NOTE — Progress Notes (Signed)
Anesthesia Chart Review:   Case:  [id number] Date/Time:  12/21/17 1200   Procedure:  RIGHT REVERSE SHOULDER ARTHROPLASTY (Right Shoulder)   Anesthesia type:  General   Pre-op diagnosis:  RIGHT SHOULDER OSTEOARTHRITIS   Location:  [facility] OR ROOM 10 / [place] [facility]   Surgeon:  [name], MD      DISCUSSION: - Pt is a 67 year old female with hx CAD (DES to LM 01/2016), aortic stenosis (moderate to severe by 08/2017 echo), CHF, ESRD on hemodialysis, HTN, DM.   - Pt has cardiac clearance for surgery dated this past April. However, Dr. Agustin Cree saw pt for f/u 10/25/17 and ordered echo.  Echo is currently scheduled for after surgery.  I reached out to Dr. Joycelyn Rua about whether or not the pt needs the echo prior to surgery. Per Caryl Pina, RN in his office, "Dr. Agustin Cree would like for this patient to have her echo completed before surgery". I notified Debbie in Dr. Randel Pigg office.     VS: BP 133/71   Pulse 73   Temp 36.8 C   Resp 20   Ht 4' 10.75" (1.492 m)   Wt 188 lb 11.4 oz (85.6 kg)   SpO2 99%   BMI 38.44 kg/m    PROVIDERS: - Receives primary care at [facility] in [place] is [name], MD.  Last office visit 10/25/17 - Nephrologist is Edrick Oh, MD - Neurologist is Margette Fast, MD    LABS: Labs reviewed: Acceptable for surgery.  - Renal function consistent with ESRD  (all labs ordered are listed, but only abnormal results are displayed)  Labs Reviewed  GLUCOSE, CAPILLARY - Abnormal; Notable for the following components:      Result Value   Glucose-Capillary 134 (*)    All other components within normal limits  BASIC METABOLIC PANEL - Abnormal; Notable for the following components:   Creatinine, Ser 5.91 (*)    Calcium 8.2 (*)    GFR calc non Af Amer 7 (*)    GFR calc Af Amer 8 (*)    All other components within normal limits  CBC - Abnormal; Notable for the following components:   RBC 3.04 (*)    Hemoglobin 11.2 (*)    HCT 34.5 (*)     MCV 113.5 (*)    MCH 36.8 (*)    RDW 16.2 (*)    All other components within normal limits  URINALYSIS, ROUTINE W REFLEX MICROSCOPIC - Abnormal; Notable for the following components:   APPearance CLOUDY (*)    pH >9.0 (*)    Hgb urine dipstick TRACE (*)    Protein, ur 100 (*)    Leukocytes, UA MODERATE (*)    All other components within normal limits  HEMOGLOBIN A1C - Abnormal; Notable for the following components:   Hgb A1c MFr Bld 4.5 (*)    All other components within normal limits  URINALYSIS, MICROSCOPIC (REFLEX) - Abnormal; Notable for the following components:   Bacteria, UA FEW (*)    All other components within normal limits  SURGICAL PCR SCREEN  URINE CULTURE    IMAGES:  CXR 10/04/17: No acute cardiopulmonary process. Basilar atelectasis/scarring.   EKG 10/03/17: NSR. ST & T wave abnormality, consider lateral ischemia. Prolonged QT. Appears stable when compared to EKG 10/09/16   CV:  Nuclear stress test 09/20/17:   Nuclear stress EF: 52%.  There was no ST segment deviation noted during stress.  The study is normal.  This is a  low risk study.  The left ventricular ejection fraction is mildly decreased (45-54%). - Diaphragmatic attenuation no ischemia or infarct EF 52%  Echo 08/18/17:  - Left ventricle: The cavity size was normal. Wall thickness was atthe upper limits of normal. Systolic function was mildly tomoderately reduced. The estimated ejection fraction was in therange of 40% to 45%. Diffuse hypokinesis. Doppler parameters are consistent with abnormal left ventricular relaxation (grade 1diastolic dysfunction). Doppler parameters are consistent withhigh ventricular filling pressure. - Aortic valve: Valve mobility was moderately restricted. There wasmoderate to severe stenosis. There was mild regurgitation. Meangradient (S): 24 mm Hg. Peak gradient (S): 41 mm Hg. VTI ratio of LVOT to aortic valve: 0.23. Valve area (VTI): 0.8 cm^2. Valvearea  (Vmax): 0.9 cm^2. Valve area (Vmean): 0.88 cm^2. SVI 30cc/min. - Mitral valve: Mildly calcified annulus. Valve area by continuity equation (using LVOT flow): 1.8 cm^2. - Left atrium: The atrium was mildly dilated. - Right ventricle: Systolic function was mildly reduced.  Carotid duplex 09/11/16: 1. Bilateral carotid bifurcation and proximal ICA plaque, resulting in less than 50% diameter stenosis. 2. Antegrade bilateral vertebral arterial flow.  Cardiac cath 01/06/16 (care everywhere): 1. Severe stenosis of the left main coronary artery.S/p DES to LM 01/08/16 (60% stenosis reduced to 10%) 2. Otherwise nonobstructive CAD(LAD, CX, and RCA with <20%)    Past Medical History:  Diagnosis Date  . Anemia   . Aortic stenosis   . CHF (congestive heart failure) ([place])   . Childhood asthma   . Chronic upper back pain   . Coronary artery disease    Stent to left main coronary artery second of August 2017  . Decreased hearing   . ESRD (end stage renal disease) on dialysis ([place])    "TTS; [facility] in [place]" (06/22/2017)  . Gait abnormality 08/28/2016  . GERD (gastroesophageal reflux disease)   . Headache    "a few times/week" (10/30/2014)  . Headache syndrome 01/08/2017  . Heart murmur   . History of blood transfusion 1996   "related to menses"  . Hyperlipidemia   . Hypertension   . Hypothyroidism   . IBS (irritable bowel syndrome)   . Myocardial infarction ([place]) 2004  . Osteoarthritis   . Pneumonia    "years ago"  . Rotator cuff arthropathy of right shoulder   . Spondylosis of cervical spine 06/16/2017  . Stroke ([place]) 07/2016   mini stroke , right side of body weak-   . Type II diabetes mellitus ([place])     Past Surgical History:  Procedure Laterality Date  . APPENDECTOMY    . AV FISTULA PLACEMENT Left 12/03/2006   "forearm"  . AV FISTULA REPAIR Left 11/25/2009   "forearm"  . CARPAL TUNNEL RELEASE Right 02/11/2017   Procedure: RIGHT CARPAL TUNNEL RELEASE;  Surgeon: Daryll Brod, MD;  Location: [facility];  Service: Orthopedics;  Laterality: Right;  . CHOLECYSTECTOMY OPEN  1970's  . COLONOSCOPY W/ POLYPECTOMY    . CORONARY ANGIOPLASTY    . CORONARY ANGIOPLASTY WITH STENT PLACEMENT    . INSERTION OF DIALYSIS CATHETER Right 2010   "chest"  . PARATHYROIDECTOMY  2010?   Parathyroid autotransplantation   . REVISON OF ARTERIOVENOUS FISTULA Left 12/04/2013   Procedure: REVISON OF ARTERIOVENOUS FISTULA;  Surgeon: Rosetta Posner, MD;  Location: [facility];  Service: Vascular;  Laterality: Left;  . REVISON OF ARTERIOVENOUS FISTULA Left 11/08/9474   Procedure: PLICATION OF LEFT BRACHIOCEPHALIC  ARTERIOVENOUS FISTULA;  Surgeon: Conrad Merchantville, MD;  Location: [facility];  Service: Vascular;  Laterality: Left;  . SHOULDER ARTHROSCOPY W/ ROTATOR CUFF REPAIR Right 06/22/2017   WITH EXTENSIVE DEBRIDEMENT  . SHOULDER ARTHROSCOPY WITH ROTATOR CUFF REPAIR AND SUBACROMIAL DECOMPRESSION Right 06/22/2017   Procedure: RIGHT SHOULDER ARTHROSCOPY WITH EXTENSIVE DEBRIDEMENT;  Surgeon: Mcarthur Rossetti, MD;  Location: [facility];  Service: Orthopedics;  Laterality: Right;  . TRIGGER FINGER RELEASE Right 10/22/2017   Procedure: RELEASE RIGHT INDEX TRIGGER FINGER/A-1 PULLEY;  Surgeon: Daryll Brod, MD;  Location: [facility];  Service: Orthopedics;  Laterality: Right;  . TUBAL LIGATION  1989  . UMBILICAL HERNIA REPAIR  1990's X 2    MEDICATIONS: . acetaminophen (TYLENOL) 325 MG tablet  . aspirin EC 81 MG tablet  . B Complex-C-Zn-Folic Acid (DIALYVITE 403 WITH ZINC) 0.8 MG TABS  . carvedilol (COREG) 12.5 MG tablet  . clonazePAM (KLONOPIN) 0.5 MG tablet  . dimenhyDRINATE (DRAMAMINE) 50 MG tablet  . docusate sodium (COLACE) 100 MG capsule  . ezetimibe (ZETIA) 10 MG tablet  . fenofibrate 160 MG tablet  . finasteride (PROSCAR) 5 MG tablet  . HYDROcodone-acetaminophen (NORCO) 5-325 MG tablet  . lanthanum (FOSRENOL) 1000 MG chewable tablet  . levothyroxine (SYNTHROID, LEVOTHROID) 50 MCG tablet   . lidocaine-prilocaine (EMLA) cream  . liraglutide (VICTOZA) 18 MG/3ML SOPN  . loperamide (IMODIUM A-D) 2 MG tablet  . nortriptyline (PAMELOR) 10 MG capsule  . omeprazole (PRILOSEC) 20 MG capsule  . sitaGLIPtin (JANUVIA) 25 MG tablet  . Turmeric Curcumin 500 MG CAPS  . Vitamin D, Ergocalciferol, (DRISDOL) 50000 units CAPS capsule  . Zinc 50 MG CAPS   No current facility-administered medications for this encounter.     Willeen Cass, FNP-BC [facility] Short Stay Surgical Center/Anesthesiology Phone: [phone] 12/17/2017 4:13 PM

## 2017-12-15 NOTE — Telephone Encounter (Signed)
Patients son called and said his mom had X-rays of her knees with her PCP and since they are not in the [facility] system they were supposed to be sending over images and reports - can you call to let them if received or not. # [phone]

## 2017-12-15 NOTE — Telephone Encounter (Signed)
IC advised had not been received.

## 2017-12-15 NOTE — Telephone Encounter (Signed)
Pt was cleared for surgery back in March but has an echo scheduled for the end of July and wants to know if they can go ahead and proceed with surgery or if they have to wait for echo results

## 2017-12-15 NOTE — Progress Notes (Signed)
Pls call in cipro 500 po q d for 3 d  as long as possible beforebefore or thx

## 2017-12-16 LAB — URINE CULTURE: Culture: >=100000 — AB

## 2017-12-16 NOTE — Progress Notes (Signed)
Please call patient with results. Thanks please send in 3 days of Cipro.  I may have already sent this message.

## 2017-12-17 ENCOUNTER — Telehealth (INDEPENDENT_AMBULATORY_CARE_PROVIDER_SITE_OTHER): Payer: Self-pay | Admitting: Orthopedic Surgery

## 2017-12-17 NOTE — Telephone Encounter (Signed)
Left message on patient's voice mail letting her know that her surgery scheduled with Dr. Marlou Sa on Tuesday has been moved from the afternoon to 7:30am and she is to arrive at the hospital at 5:30am.  Message was also left on [name]'s (pt's son) voicemail. I have asked patient to call me if she has any questions.

## 2017-12-20 ENCOUNTER — Telehealth (INDEPENDENT_AMBULATORY_CARE_PROVIDER_SITE_OTHER): Payer: Self-pay | Admitting: Orthopedic Surgery

## 2017-12-20 ENCOUNTER — Telehealth: Payer: Self-pay | Admitting: Cardiology

## 2017-12-20 NOTE — Telephone Encounter (Signed)
Patient wants to know if she can go ahead and reschedule for after 12/28/17 or do you want her to wait to reschedule until after she has been cleared following the echocardiogram?

## 2017-12-20 NOTE — Telephone Encounter (Signed)
States shes supposed to have surgery tomorrow but she was denied because of not having an echo yet but she just had one in April-wants someone to call her back asap

## 2017-12-20 NOTE — Telephone Encounter (Signed)
I called patient on Friday after receiving a call from Roberta Bryant in short stay to let the patient  know that the shoulder surgery with Dr. Marlou Sa would need to be cancelled, unless an ECHO could be obtained, reviewed and cleared with the Cardiologist on Monday June 15th.  An effort was made to do this by calling and speaking with Roberta Bryant [phone]) with Dr. Wendy Poet office, however scheduling was full with no time to offer the patient.  I explained to Roberta Bryant, although cardiac clearance was obtained April 17th,  the cardiologist would like for her to have another echo prior to this surgery. Patient is scheduled for the ECHO on July 23rd. I advised the patient to call her cardiologist with her questions. The patient's son Roberta Bryant) was unable to reach anyone at the office on Friday and stated that he would make an attempt Monday morning.  I spoke with both patient and patient's son Roberta Bryant) this morning, explaining that getting clearance for surgery is a joint effort and responsibility for all those involved in her care, and our aim is make certain that she is healthy and well enough to have this surgery.  Her question now is "Can I reschedule for a future date, or do I have to wait until after June 23rd?"

## 2017-12-20 NOTE — Telephone Encounter (Signed)
Spoke with the patient regarding her surgery being rescheduled. The patient was very upset that her surgery had to rescheduled, informed the patient that per Dr. Agustin Cree she needs to have another echo completed before. Patient is scheduled to have her echo done this month.

## 2017-12-21 ENCOUNTER — Inpatient Hospital Stay (HOSPITAL_COMMUNITY): Admission: RE | Admit: 2017-12-21 | Payer: Medicare Other | Source: Ambulatory Visit | Admitting: Orthopedic Surgery

## 2017-12-21 ENCOUNTER — Encounter (HOSPITAL_COMMUNITY): Admission: RE | Payer: Self-pay | Source: Ambulatory Visit

## 2017-12-21 SURGERY — ARTHROPLASTY, SHOULDER, TOTAL, REVERSE
Anesthesia: General | Site: Shoulder | Laterality: Right

## 2017-12-21 NOTE — Telephone Encounter (Signed)
Pls get date for surgery after 7/23 no need to wait

## 2017-12-21 NOTE — Telephone Encounter (Signed)
See note from Dr Marlou Sa.

## 2017-12-23 ENCOUNTER — Telehealth (INDEPENDENT_AMBULATORY_CARE_PROVIDER_SITE_OTHER): Payer: Self-pay | Admitting: Orthopedic Surgery

## 2017-12-23 NOTE — Telephone Encounter (Signed)
Please advise. Thanks.  

## 2017-12-23 NOTE — Telephone Encounter (Signed)
Patient calling to request hydrocodone for pain. [name] (son) can pick up whenever it is ready.  She states the pain seems to be worse when she is having dialysis and is asking why that is.    Her surgery has been  rescheduled for R-RSA on Aug 1st @ [facility].  Her ECHO is scheduled for July 23rd.

## 2017-12-24 MED ORDER — HYDROCODONE-ACETAMINOPHEN 5-325 MG PO TABS: 1.0000 | ORAL_TABLET | Freq: Three times a day (TID) | ORAL | 0 refills | Status: DC | PRN

## 2017-12-24 NOTE — Telephone Encounter (Signed)
IC advised could pick up at front desk to take to pharmacy.

## 2017-12-24 NOTE — Telephone Encounter (Signed)
Okay for Norco 1 p.o. every 8 hours as needed pain #40

## 2017-12-27 ENCOUNTER — Other Ambulatory Visit: Payer: Medicare Other

## 2017-12-28 ENCOUNTER — Other Ambulatory Visit: Payer: Self-pay

## 2017-12-28 ENCOUNTER — Other Ambulatory Visit: Payer: Self-pay | Admitting: Cardiology

## 2017-12-28 ENCOUNTER — Ambulatory Visit (INDEPENDENT_AMBULATORY_CARE_PROVIDER_SITE_OTHER): Payer: Medicare Other

## 2017-12-28 DIAGNOSIS — I2510 Atherosclerotic heart disease of native coronary artery without angina pectoris: Secondary | ICD-10-CM | POA: Diagnosis not present

## 2017-12-28 DIAGNOSIS — I350 Nonrheumatic aortic (valve) stenosis: Secondary | ICD-10-CM

## 2017-12-28 NOTE — Progress Notes (Signed)
Limited echocardiogram has been performed.  Jimmy Mauria Asquith, RDCS

## 2017-12-30 ENCOUNTER — Other Ambulatory Visit (INDEPENDENT_AMBULATORY_CARE_PROVIDER_SITE_OTHER): Payer: Self-pay | Admitting: Orthopedic Surgery

## 2017-12-30 DIAGNOSIS — M12811 Other specific arthropathies, not elsewhere classified, right shoulder: Secondary | ICD-10-CM

## 2018-01-03 ENCOUNTER — Inpatient Hospital Stay (INDEPENDENT_AMBULATORY_CARE_PROVIDER_SITE_OTHER): Payer: Medicare Other | Admitting: Orthopedic Surgery

## 2018-01-03 NOTE — Pre-Procedure Instructions (Signed)
Roberta Bryant  01/03/2018      [facility], [place] [address] Phone: [phone] Fax: [phone]    Your procedure is scheduled on Thursday August 1.  Report to Operating Room Services Admitting at 7:40 A.M.  Call this number if you have problems the morning of surgery:  [phone]   Remember:  Do not eat or drink after midnight.    Take these medicines the morning of surgery with A SIP OF WATER:   Levothyroxine (synthroid) Finasteride (proscar) Omeprazole (prilosec) Hydrocodone if needed OR Acetaminophen (tylenol) if needed  DO NOT TAKE sitagliptin (Januvia) the day of surgery DO NOT TAKE liraglutide (Victoza) the day of surgery  7 days prior to surgery STOP taking any Aleve, Naproxen, Ibuprofen, Motrin, Advil, Goody's, BC's, all herbal medications, fish oil, and all vitamins  FOLLOW your surgeon's instructions on stopping Aspirin.       How to Manage Your Diabetes Before and After Surgery  Why is it important to control my blood sugar before and after surgery? . Improving blood sugar levels before and after surgery helps healing and can limit problems. . A way of improving blood sugar control is eating a healthy diet by: o  Eating less sugar and carbohydrates o  Increasing activity/exercise o  Talking with your doctor about reaching your blood sugar goals . High blood sugars (greater than 180 mg/dL) can raise your risk of infections and slow your recovery, so you will need to focus on controlling your diabetes during the weeks before surgery. . Make sure that the doctor who takes care of your diabetes knows about your planned surgery including the date and location.  How do I manage my blood sugar before surgery? . Check your blood sugar at least 4 times a day, starting 2 days before surgery, to make sure that the level is not too high or low. o Check your blood sugar the morning of  your surgery when you wake up and every 2 hours until you get to the Short Stay unit. . If your blood sugar is less than 70 mg/dL, you will need to treat for low blood sugar: o Do not take insulin. o Treat a low blood sugar (less than 70 mg/dL) with  cup of clear juice (cranberry or apple), 4 glucose tablets, OR glucose gel. Recheck blood sugar in 15 minutes after treatment (to make sure it is greater than 70 mg/dL). If your blood sugar is not greater than 70 mg/dL on recheck, call [phone] o  for further instructions. . Report your blood sugar to the short stay nurse when you get to Short Stay.  . If you are admitted to the hospital after surgery: o Your blood sugar will be checked by the staff and you will probably be given insulin after surgery (instead of oral diabetes medicines) to make sure you have good blood sugar levels. o The goal for blood sugar control after surgery is 80-180 mg/dL.             Do not wear jewelry, make-up or nail polish.  Do not wear lotions, powders, or perfumes, or deodorant.  Do not shave 48 hours prior to surgery.  Men may shave face and neck.  Do not bring valuables to the hospital.  [facility] is not responsible for any belongings or valuables.  Contacts, dentures or bridgework may not be worn into surgery.  Leave your  suitcase in the car.  After surgery it may be brought to your room.  For patients admitted to the hospital, discharge time will be determined by your treatment team.  Patients discharged the day of surgery will not be allowed to drive home.   Special instructions:    [facility]- Preparing For Surgery  Before surgery, you can play an important role. Because skin is not sterile, your skin needs to be as free of germs as possible. You can reduce the number of germs on your skin by washing with CHG (chlorahexidine gluconate) Soap before surgery.  CHG is an antiseptic cleaner which kills germs and bonds with the skin to  continue killing germs even after washing.    Oral Hygiene is also important to reduce your risk of infection.  Remember - BRUSH YOUR TEETH THE MORNING OF SURGERY WITH YOUR REGULAR TOOTHPASTE  Please do not use if you have an allergy to CHG or antibacterial soaps. If your skin becomes reddened/irritated stop using the CHG.  Do not shave (including legs and underarms) for at least 48 hours prior to first CHG shower. It is OK to shave your face.  Please follow these instructions carefully.   1. Shower the NIGHT BEFORE SURGERY and the MORNING OF SURGERY with CHG.   2. If you chose to wash your hair, wash your hair first as usual with your normal shampoo.  3. After you shampoo, rinse your hair and body thoroughly to remove the shampoo.  4. Use CHG as you would any other liquid soap. You can apply CHG directly to the skin and wash gently with a scrungie or a clean washcloth.   5. Apply the CHG Soap to your body ONLY FROM THE NECK DOWN.  Do not use on open wounds or open sores. Avoid contact with your eyes, ears, mouth and genitals (private parts). Wash Face and genitals (private parts)  with your normal soap.  6. Wash thoroughly, paying special attention to the area where your surgery will be performed.  7. Thoroughly rinse your body with warm water from the neck down.  8. DO NOT shower/wash with your normal soap after using and rinsing off the CHG Soap.  9. Pat yourself dry with a CLEAN TOWEL.  10. Wear CLEAN PAJAMAS to bed the night before surgery, wear comfortable clothes the morning of surgery  11. Place CLEAN SHEETS on your bed the night of your first shower and DO NOT SLEEP WITH PETS.    Day of Surgery:  Do not apply any deodorants/lotions.  Please wear clean clothes to the hospital/surgery center.   Remember to brush your teeth WITH YOUR REGULAR TOOTHPASTE.    Please read over the following fact sheets that you were given. Coughing and Deep Breathing, MRSA Information and  Surgical Site Infection Prevention

## 2018-01-04 ENCOUNTER — Encounter (HOSPITAL_COMMUNITY)
Admission: RE | Admit: 2018-01-04 | Discharge: 2018-01-04 | Disposition: A | Discharge status: Home / Self Care | Payer: Medicare Other | Source: Ambulatory Visit | Attending: Orthopedic Surgery | Admitting: Orthopedic Surgery

## 2018-01-04 ENCOUNTER — Other Ambulatory Visit: Payer: Self-pay

## 2018-01-04 ENCOUNTER — Encounter (HOSPITAL_COMMUNITY): Payer: Self-pay

## 2018-01-04 DIAGNOSIS — Z79890 Hormone replacement therapy (postmenopausal): Secondary | ICD-10-CM

## 2018-01-04 DIAGNOSIS — Z01818 Encounter for other preprocedural examination: Secondary | ICD-10-CM

## 2018-01-04 DIAGNOSIS — Z992 Dependence on renal dialysis: Secondary | ICD-10-CM | POA: Insufficient documentation

## 2018-01-04 DIAGNOSIS — I509 Heart failure, unspecified: Secondary | ICD-10-CM | POA: Insufficient documentation

## 2018-01-04 DIAGNOSIS — I2510 Atherosclerotic heart disease of native coronary artery without angina pectoris: Secondary | ICD-10-CM | POA: Insufficient documentation

## 2018-01-04 DIAGNOSIS — E785 Hyperlipidemia, unspecified: Secondary | ICD-10-CM

## 2018-01-04 DIAGNOSIS — E039 Hypothyroidism, unspecified: Secondary | ICD-10-CM | POA: Insufficient documentation

## 2018-01-04 DIAGNOSIS — K219 Gastro-esophageal reflux disease without esophagitis: Secondary | ICD-10-CM

## 2018-01-04 DIAGNOSIS — I132 Hypertensive heart and chronic kidney disease with heart failure and with stage 5 chronic kidney disease, or end stage renal disease: Secondary | ICD-10-CM | POA: Insufficient documentation

## 2018-01-04 DIAGNOSIS — Z7982 Long term (current) use of aspirin: Secondary | ICD-10-CM

## 2018-01-04 DIAGNOSIS — Z79899 Other long term (current) drug therapy: Secondary | ICD-10-CM

## 2018-01-04 DIAGNOSIS — I700 Atherosclerosis of aorta: Secondary | ICD-10-CM | POA: Insufficient documentation

## 2018-01-04 DIAGNOSIS — N186 End stage renal disease: Secondary | ICD-10-CM | POA: Insufficient documentation

## 2018-01-04 DIAGNOSIS — E1122 Type 2 diabetes mellitus with diabetic chronic kidney disease: Secondary | ICD-10-CM | POA: Insufficient documentation

## 2018-01-04 DIAGNOSIS — I252 Old myocardial infarction: Secondary | ICD-10-CM | POA: Insufficient documentation

## 2018-01-04 DIAGNOSIS — M19011 Primary osteoarthritis, right shoulder: Secondary | ICD-10-CM | POA: Insufficient documentation

## 2018-01-04 LAB — URINALYSIS, ROUTINE W REFLEX MICROSCOPIC
Bilirubin Urine: NEGATIVE
Glucose, UA: NEGATIVE mg/dL
Hgb urine dipstick: NEGATIVE
Ketones, ur: NEGATIVE mg/dL
Nitrite: NEGATIVE
Protein, ur: 100 mg/dL — AB
Specific Gravity, Urine: 1.01 (ref 1.005–1.030)
pH: 8.5 — ABNORMAL HIGH (ref 5.0–8.0)

## 2018-01-04 LAB — BASIC METABOLIC PANEL
Anion gap: 11 (ref 5–15)
BUN: 16 mg/dL (ref 8–23)
CHLORIDE: 102 mmol/L (ref 98–111)
CO2: 27 mmol/L (ref 22–32)
Calcium: 8 mg/dL — ABNORMAL LOW (ref 8.9–10.3)
Creatinine, Ser: 5.1 mg/dL — ABNORMAL HIGH (ref 0.44–1.00)
GFR calc Af Amer: 9 mL/min — ABNORMAL LOW (ref >60)
GFR, EST NON AFRICAN AMERICAN: 8 mL/min — AB (ref >60)
GLUCOSE: 87 mg/dL (ref 70–99)
POTASSIUM: 3.8 mmol/L (ref 3.5–5.1)
Sodium: 140 mmol/L (ref 135–145)

## 2018-01-04 LAB — URINALYSIS, MICROSCOPIC (REFLEX): RBC / HPF: NONE SEEN RBC/hpf (ref 0–5)

## 2018-01-04 LAB — CBC
HEMATOCRIT: 32.9 % — AB (ref 36.0–46.0)
Hemoglobin: 10.6 g/dL — ABNORMAL LOW (ref 12.0–15.0)
MCH: 37.2 pg — AB (ref 26.0–34.0)
MCHC: 32.2 g/dL (ref 30.0–36.0)
MCV: 115.4 fL — ABNORMAL HIGH (ref 78.0–100.0)
Platelets: 201 10*3/uL (ref 150–400)
RBC: 2.85 MIL/uL — ABNORMAL LOW (ref 3.87–5.11)
RDW: 14.8 % (ref 11.5–15.5)
WBC: 4.6 10*3/uL (ref 4.0–10.5)

## 2018-01-04 LAB — GLUCOSE, CAPILLARY: Glucose-Capillary: 125 mg/dL — ABNORMAL HIGH (ref 70–99)

## 2018-01-04 LAB — ABO/RH: ABO/RH(D): O POS

## 2018-01-04 NOTE — Progress Notes (Signed)
PCP - [facility]  Cardiologist - Dr, Agustin Cree  Chest x-ray - 10/04/17 (E)  EKG - 10/04/17 (E)  Stress Test - 08/25/17 (E)  ECHO - 12/28/17 (E)  Cardiac Cath - 01/08/16 (E)  Sleep Study - Yes- Neg CPAP - None  LABS- 01/04/18: CBC, BMP, T/S, UA, UC  ASA- LD-7/28  HA1C- 12/14/17: 4.5 (E) Fasting Blood Sugar - Today, 125 Checks Blood Sugar __0___ times a day- Pt gets cbg checks during dialysis M-W-F  Anesthesia- Yes- Previous note 12/14/17  Pt denies having chest pain, sob, or fever at this time. All instructions explained to the pt, with a verbal understanding of the material. Pt agrees to go over the instructions while at home for a better understanding. The opportunity to ask questions was provided.

## 2018-01-05 ENCOUNTER — Encounter (HOSPITAL_COMMUNITY): Payer: Self-pay

## 2018-01-05 LAB — TYPE AND SCREEN
ABO/RH(D): O POS
Antibody Screen: NEGATIVE

## 2018-01-05 LAB — URINE CULTURE

## 2018-01-05 NOTE — Progress Notes (Signed)
Anesthesia Chart Review:  Case:  [id number] Date/Time:  01/06/18 6734   Procedure:  RIGHT REVERSE SHOULDER ARTHROPLASTY (Right )   Anesthesia type:  General   Pre-op diagnosis:  RIGHT SHOULDER ROTATOR CUFF ARTHROPATHY   Location:  [facility] OR ROOM 04 / [place]   Surgeon:  [name], MD      DISCUSSION: Pt is a 67 year old female former smoker. Pertinent hx includes CAD(DES to [facility] 01/2016), aortic stenosis (moderate by 12/2017 echo), CHF, ESRD on hemodialysis MWF, HTN, Anemia, Hypothyroid, GERD, HA,  DMII.  Pt was previously scheduled for the above procedure on 12/21/2017 and had prior cardiac clearance, however it was postponed because the pt saw Dr. Agustin Cree 10/25/17 and he recommended repeat Echo which had not yet been done prior to scheduled surgery.   Echo performed 12/28/2017, Dr. Agustin Cree noted in the results "mod AS, Ok to proceed with surgery"  At pt's PAT appointment her BP was markedly elevated. Review of previous records shows normotensive readings. Per PAT nurse Minna Merritts the pt was having very significant pain in her right shoulder at the time. I spoke with the pt's son and he said that her BP is usually in normotensive range and he had not previously seen a reading that high. He said he has noticed cuff placement seems to make a big difference with her. I advised that if her BP remains this elevated on DOS we may not be able to proceed due to safety concerns. Her verbalized understanding.    BP Readings from Last 3 Encounters:  01/04/18 (!) 192/98  12/14/17 133/71  10/22/17 (!) 127/59   Anticipate she can proceed with surgery as planned if BP acceptable on DOS  VS: BP (!) 192/98 Comment: notified Adrain RN  Pulse 72   Temp 36.7 C   Resp 20   Ht 4' 10.75" (1.492 m)   Wt 188 lb 7.9 oz (85.5 kg)   SpO2 100%   BMI 38.40 kg/m   PROVIDERS: [facility]  [name], MD is Cardiologist last seen 10/25/2017  Edrick Oh, MD is  Nephrologist  LABS: Labs reviewed: Acceptable for surgery. Labs c/w ESRD on HD (all labs ordered are listed, but only abnormal results are displayed)  Labs Reviewed  BASIC METABOLIC PANEL - Abnormal; Notable for the following components:      Result Value   Creatinine, Ser 5.10 (*)    Calcium 8.0 (*)    GFR calc non Af Amer 8 (*)    GFR calc Af Amer 9 (*)    All other components within normal limits  CBC - Abnormal; Notable for the following components:   RBC 2.85 (*)    Hemoglobin 10.6 (*)    HCT 32.9 (*)    MCV 115.4 (*)    MCH 37.2 (*)    All other components within normal limits  URINALYSIS, ROUTINE W REFLEX MICROSCOPIC - Abnormal; Notable for the following components:   pH 8.5 (*)    Protein, ur 100 (*)    Leukocytes, UA SMALL (*)    All other components within normal limits  GLUCOSE, CAPILLARY - Abnormal; Notable for the following components:   Glucose-Capillary 125 (*)    All other components within normal limits  URINALYSIS, MICROSCOPIC (REFLEX) - Abnormal; Notable for the following components:   Bacteria, UA RARE (*)    All other components within normal limits  URINE CULTURE  TYPE AND SCREEN  ABO/RH  IMAGES: CHEST - 2 VIEW 10/03/2017  COMPARISON:  Chest radiograph 08/13/2016.  FINDINGS: Stable cardiac and mediastinal contours. Tortuosity of the thoracic aorta. Bibasilar atelectasis. No large area pulmonary consolidation. No pleural effusion or pneumothorax. Thoracic spine degenerative changes.  IMPRESSION:  No acute cardiopulmonary process.  Basilar atelectasis/scarring.   EKG: 10/03/17:NSR.ST & T wave abnormality, consider lateral ischemia.Prolonged QT. Appears stable when compared to EKG 10/09/16  CV: Echo 12/28/2017: Study Conclusions  - Left ventricle: The cavity size was normal. Wall thickness was   increased in a pattern of mild LVH. Systolic function was normal.   The estimated ejection fraction was in the range of 60% to 65%.    Wall motion was normal; there were no regional wall motion   abnormalities. - Aortic valve: There was moderate stenosis. Valve area (VTI): 1.07   cm^2. Valve area (Vmax): 1.07 cm^2. Valve area (Vmean): 0.96   cm^2. - Left atrium: The atrium was mildly dilated.  Impressions:  - 1. Moderate aortic stenosis. Mild regurgitation.   2. Normal left ventricular systolic function visually estimated   ejection fraction is 60 to 65%. Mild concentric left ventricular   hypertrophy.   3. Mild left atrial enlargement.  Nuclear stress test 09/20/17:  Nuclear stress EF: 52%.  There was no ST segment deviation noted during stress.  The study is normal.  This is a low risk study.  The left ventricular ejection fraction is mildly decreased (45-54%). -Diaphragmatic attenuation no ischemia or infarct EF 52%   Past Medical History:  Diagnosis Date  . Anemia   . Aortic stenosis   . CHF (congestive heart failure) ([place])   . Childhood asthma   . Chronic upper back pain   . Coronary artery disease    Stent to left main coronary artery second of August 2017  . Decreased hearing   . ESRD (end stage renal disease) on dialysis [facility])    "MWF; [facility] in [place]" (06/22/2017)  . Gait abnormality 08/28/2016  . GERD (gastroesophageal reflux disease)   . Headache    "a few times/week" (10/30/2014)  . Headache syndrome 01/08/2017  . Heart murmur   . History of blood transfusion 1996   "related to menses"  . Hyperlipidemia   . Hypertension   . Hypothyroidism   . IBS (irritable bowel syndrome)   . Myocardial infarction ([place]) 2004  . Osteoarthritis   . Pneumonia    "years ago"  . Rotator cuff arthropathy of right shoulder   . Spondylosis of cervical spine 06/16/2017  . Stroke ([place]) 07/2016   mini stroke , right side of body weak-   . Type II diabetes mellitus ([place])     Past Surgical History:  Procedure Laterality Date  . APPENDECTOMY    . AV FISTULA PLACEMENT Left 12/03/2006   "forearm"   . AV FISTULA REPAIR Left 11/25/2009   "forearm"  . CARPAL TUNNEL RELEASE Right 02/11/2017   Procedure: RIGHT CARPAL TUNNEL RELEASE;  Surgeon: Daryll Brod, MD;  Location: [facility];  Service: Orthopedics;  Laterality: Right;  . CHOLECYSTECTOMY OPEN  1970's  . COLONOSCOPY W/ POLYPECTOMY    . CORONARY ANGIOPLASTY    . CORONARY ANGIOPLASTY WITH STENT PLACEMENT    . INSERTION OF DIALYSIS CATHETER Right 2010   "chest"  . PARATHYROIDECTOMY  2010?   Parathyroid autotransplantation   . REVISON OF ARTERIOVENOUS FISTULA Left 12/04/2013   Procedure: REVISON OF ARTERIOVENOUS FISTULA;  Surgeon: Rosetta Posner, MD;  Location: [facility];  Service: Vascular;  Laterality: Left;  . REVISON OF ARTERIOVENOUS FISTULA Left 0/0/7121   Procedure: PLICATION OF LEFT BRACHIOCEPHALIC  ARTERIOVENOUS FISTULA;  Surgeon: Conrad Lynwood, MD;  Location: [facility];  Service: Vascular;  Laterality: Left;  . SHOULDER ARTHROSCOPY W/ ROTATOR CUFF REPAIR Right 06/22/2017   WITH EXTENSIVE DEBRIDEMENT  . SHOULDER ARTHROSCOPY WITH ROTATOR CUFF REPAIR AND SUBACROMIAL DECOMPRESSION Right 06/22/2017   Procedure: RIGHT SHOULDER ARTHROSCOPY WITH EXTENSIVE DEBRIDEMENT;  Surgeon: Mcarthur Rossetti, MD;  Location: [facility];  Service: Orthopedics;  Laterality: Right;  . TRIGGER FINGER RELEASE Right 10/22/2017   Procedure: RELEASE RIGHT INDEX TRIGGER FINGER/A-1 PULLEY;  Surgeon: Daryll Brod, MD;  Location: [facility];  Service: Orthopedics;  Laterality: Right;  . TUBAL LIGATION  1989  . UMBILICAL HERNIA REPAIR  1990's X 2    MEDICATIONS: . acetaminophen (TYLENOL) 500 MG tablet  . aspirin EC 81 MG tablet  . B Complex-C-Zn-Folic Acid (DIALYVITE 975 WITH ZINC) 0.8 MG TABS  . carvedilol (COREG) 12.5 MG tablet  . ciprofloxacin (CIPRO) 500 MG tablet  . clonazePAM (KLONOPIN) 0.5 MG tablet  . dimenhyDRINATE (DRAMAMINE) 50 MG tablet  . docusate sodium (COLACE) 100 MG capsule  . ezetimibe (ZETIA) 10 MG tablet  . fenofibrate 160 MG tablet  .  finasteride (PROSCAR) 5 MG tablet  . HYDROcodone-acetaminophen (NORCO) 5-325 MG tablet  . HYDROcodone-acetaminophen (NORCO/VICODIN) 5-325 MG tablet  . lanthanum (FOSRENOL) 1000 MG chewable tablet  . levothyroxine (SYNTHROID, LEVOTHROID) 50 MCG tablet  . lidocaine-prilocaine (EMLA) cream  . liraglutide (VICTOZA) 18 MG/3ML SOPN  . loperamide (IMODIUM A-D) 2 MG tablet  . nortriptyline (PAMELOR) 10 MG capsule  . omeprazole (PRILOSEC) 20 MG capsule  . sitaGLIPtin (JANUVIA) 25 MG tablet  . Turmeric Curcumin 500 MG CAPS  . Vitamin D, Ergocalciferol, (DRISDOL) 50000 units CAPS capsule  . Zinc 50 MG CAPS   No current facility-administered medications for this encounter.      Wynonia Musty [facility] Short Stay Center/Anesthesiology Phone [phone] 01/05/2018 10:37 AM

## 2018-01-06 ENCOUNTER — Encounter (HOSPITAL_COMMUNITY): Payer: Self-pay | Admitting: Anesthesiology

## 2018-01-06 ENCOUNTER — Inpatient Hospital Stay (HOSPITAL_COMMUNITY)
Admission: RE | Admit: 2018-01-06 | Discharge: 2018-01-10 | DRG: 483 | Disposition: A | Discharge status: Skilled Nursing Facility | Payer: Medicare Other | Attending: Orthopedic Surgery | Admitting: Orthopedic Surgery

## 2018-01-06 ENCOUNTER — Inpatient Hospital Stay (HOSPITAL_COMMUNITY): Payer: Medicare Other | Admitting: Physician Assistant

## 2018-01-06 ENCOUNTER — Encounter (HOSPITAL_COMMUNITY)
Admission: RE | Disposition: A | Discharge status: Skilled Nursing Facility | Payer: Self-pay | Source: Home / Self Care | Attending: Orthopedic Surgery

## 2018-01-06 ENCOUNTER — Inpatient Hospital Stay (HOSPITAL_COMMUNITY): Payer: Medicare Other | Admitting: Anesthesiology

## 2018-01-06 ENCOUNTER — Other Ambulatory Visit: Payer: Self-pay

## 2018-01-06 ENCOUNTER — Inpatient Hospital Stay (HOSPITAL_COMMUNITY): Payer: Medicare Other

## 2018-01-06 DIAGNOSIS — Z888 Allergy status to other drugs, medicaments and biological substances status: Secondary | ICD-10-CM

## 2018-01-06 DIAGNOSIS — E890 Postprocedural hypothyroidism: Secondary | ICD-10-CM | POA: Diagnosis present

## 2018-01-06 DIAGNOSIS — Z87891 Personal history of nicotine dependence: Secondary | ICD-10-CM | POA: Diagnosis not present

## 2018-01-06 DIAGNOSIS — D631 Anemia in chronic kidney disease: Secondary | ICD-10-CM | POA: Diagnosis present

## 2018-01-06 DIAGNOSIS — M19011 Primary osteoarthritis, right shoulder: Principal | ICD-10-CM | POA: Diagnosis present

## 2018-01-06 DIAGNOSIS — E1122 Type 2 diabetes mellitus with diabetic chronic kidney disease: Secondary | ICD-10-CM | POA: Diagnosis present

## 2018-01-06 DIAGNOSIS — H9190 Unspecified hearing loss, unspecified ear: Secondary | ICD-10-CM | POA: Diagnosis present

## 2018-01-06 DIAGNOSIS — Z79899 Other long term (current) drug therapy: Secondary | ICD-10-CM

## 2018-01-06 DIAGNOSIS — K219 Gastro-esophageal reflux disease without esophagitis: Secondary | ICD-10-CM | POA: Diagnosis present

## 2018-01-06 DIAGNOSIS — I252 Old myocardial infarction: Secondary | ICD-10-CM

## 2018-01-06 DIAGNOSIS — Z992 Dependence on renal dialysis: Secondary | ICD-10-CM | POA: Diagnosis not present

## 2018-01-06 DIAGNOSIS — Z8673 Personal history of transient ischemic attack (TIA), and cerebral infarction without residual deficits: Secondary | ICD-10-CM

## 2018-01-06 DIAGNOSIS — Z882 Allergy status to sulfonamides status: Secondary | ICD-10-CM

## 2018-01-06 DIAGNOSIS — Z880 Allergy status to penicillin: Secondary | ICD-10-CM

## 2018-01-06 DIAGNOSIS — M19019 Primary osteoarthritis, unspecified shoulder: Secondary | ICD-10-CM | POA: Diagnosis not present

## 2018-01-06 DIAGNOSIS — Z79890 Hormone replacement therapy (postmenopausal): Secondary | ICD-10-CM

## 2018-01-06 DIAGNOSIS — Z955 Presence of coronary angioplasty implant and graft: Secondary | ICD-10-CM | POA: Diagnosis not present

## 2018-01-06 DIAGNOSIS — I132 Hypertensive heart and chronic kidney disease with heart failure and with stage 5 chronic kidney disease, or end stage renal disease: Secondary | ICD-10-CM | POA: Diagnosis present

## 2018-01-06 DIAGNOSIS — I509 Heart failure, unspecified: Secondary | ICD-10-CM | POA: Diagnosis present

## 2018-01-06 DIAGNOSIS — I2510 Atherosclerotic heart disease of native coronary artery without angina pectoris: Secondary | ICD-10-CM | POA: Diagnosis present

## 2018-01-06 DIAGNOSIS — M25511 Pain in right shoulder: Secondary | ICD-10-CM | POA: Diagnosis present

## 2018-01-06 DIAGNOSIS — E669 Obesity, unspecified: Secondary | ICD-10-CM | POA: Diagnosis present

## 2018-01-06 DIAGNOSIS — Z6838 Body mass index (BMI) 38.0-38.9, adult: Secondary | ICD-10-CM

## 2018-01-06 DIAGNOSIS — N186 End stage renal disease: Secondary | ICD-10-CM | POA: Diagnosis present

## 2018-01-06 DIAGNOSIS — Z7982 Long term (current) use of aspirin: Secondary | ICD-10-CM | POA: Diagnosis not present

## 2018-01-06 DIAGNOSIS — M12811 Other specific arthropathies, not elsewhere classified, right shoulder: Secondary | ICD-10-CM | POA: Diagnosis not present

## 2018-01-06 HISTORY — PX: REVERSE SHOULDER ARTHROPLASTY: SHX5054

## 2018-01-06 LAB — POCT I-STAT 4, (NA,K, GLUC, HGB,HCT)
GLUCOSE: 96 mg/dL (ref 70–99)
HCT: 38 % (ref 36.0–46.0)
Hemoglobin: 12.9 g/dL (ref 12.0–15.0)
POTASSIUM: 3.8 mmol/L (ref 3.5–5.1)
SODIUM: 139 mmol/L (ref 135–145)

## 2018-01-06 LAB — GLUCOSE, CAPILLARY
Glucose-Capillary: 99 mg/dL (ref 70–99)
Glucose-Capillary: 126 mg/dL — ABNORMAL HIGH (ref 70–99)

## 2018-01-06 SURGERY — ARTHROPLASTY, SHOULDER, TOTAL, REVERSE
Anesthesia: General | Laterality: Right

## 2018-01-06 MED ORDER — MENTHOL 3 MG MT LOZG
1.0000 | LOZENGE | OROMUCOSAL | Status: DC | PRN
  Administered 2018-01-09: 3 mg via ORAL
  Filled 2018-01-06: qty 9

## 2018-01-06 MED ORDER — FENOFIBRATE 160 MG PO TABS
160.0000 mg | ORAL_TABLET | Freq: Every day | ORAL | Status: DC
  Administered 2018-01-06 – 2018-01-10 (×5): 160 mg via ORAL
  Filled 2018-01-06 (×5): qty 1

## 2018-01-06 MED ORDER — ZINC 50 MG PO CAPS: 50.0000 mg | ORAL_CAPSULE | Freq: Every day | ORAL | Status: DC

## 2018-01-06 MED ORDER — VANCOMYCIN HCL 1000 MG IV SOLR
INTRAVENOUS | Status: DC | PRN
  Administered 2018-01-06: 1000 mg via TOPICAL

## 2018-01-06 MED ORDER — LIDOCAINE 2% (20 MG/ML) 5 ML SYRINGE
INTRAMUSCULAR | Status: AC
  Filled 2018-01-06: qty 10

## 2018-01-06 MED ORDER — ENSURE ENLIVE PO LIQD: 237.0000 mL | Freq: Two times a day (BID) | ORAL | Status: DC

## 2018-01-06 MED ORDER — LACTATED RINGERS IV SOLN
INTRAVENOUS | Status: AC
  Administered 2018-01-06: 18:00:00 via INTRAVENOUS

## 2018-01-06 MED ORDER — NORTRIPTYLINE HCL 10 MG PO CAPS
10.0000 mg | ORAL_CAPSULE | Freq: Every day | ORAL | Status: DC
  Administered 2018-01-06 – 2018-01-09 (×4): 10 mg via ORAL
  Filled 2018-01-06 (×4): qty 1

## 2018-01-06 MED ORDER — ACETAMINOPHEN 10 MG/ML IV SOLN: 1000.0000 mg | Freq: Once | INTRAVENOUS | Status: DC | PRN

## 2018-01-06 MED ORDER — LIDOCAINE HCL (CARDIAC) PF 100 MG/5ML IV SOSY
PREFILLED_SYRINGE | INTRAVENOUS | Status: DC | PRN
  Administered 2018-01-06: 100 mg via INTRAVENOUS

## 2018-01-06 MED ORDER — ONDANSETRON HCL 4 MG/2ML IJ SOLN
INTRAMUSCULAR | Status: DC | PRN
  Administered 2018-01-06: 4 mg via INTRAVENOUS

## 2018-01-06 MED ORDER — METHOCARBAMOL 500 MG PO TABS
500.0000 mg | ORAL_TABLET | Freq: Four times a day (QID) | ORAL | Status: DC | PRN
  Administered 2018-01-06 – 2018-01-10 (×9): 500 mg via ORAL
  Filled 2018-01-06 (×9): qty 1

## 2018-01-06 MED ORDER — 0.9 % SODIUM CHLORIDE (POUR BTL) OPTIME
TOPICAL | Status: DC | PRN
  Administered 2018-01-06 (×5): 1000 mL

## 2018-01-06 MED ORDER — PHENYLEPHRINE HCL 10 MG/ML IJ SOLN
INTRAMUSCULAR | Status: DC | PRN
  Administered 2018-01-06: 60 ug/min via INTRAVENOUS
  Administered 2018-01-06: 30 ug/min via INTRAVENOUS

## 2018-01-06 MED ORDER — BUPIVACAINE LIPOSOME 1.3 % IJ SUSP
INTRAMUSCULAR | Status: DC | PRN
  Administered 2018-01-06 (×3): 2.5 mL via PERINEURAL
  Administered 2018-01-06: 2.5 mL

## 2018-01-06 MED ORDER — RENA-VITE PO TABS
1.0000 | ORAL_TABLET | Freq: Every day | ORAL | Status: DC
Start: 2018-01-06 — End: 2018-01-10
  Administered 2018-01-06 – 2018-01-09 (×4): 1 via ORAL
  Filled 2018-01-06 (×4): qty 1

## 2018-01-06 MED ORDER — MEPERIDINE HCL 50 MG/ML IJ SOLN: 6.2500 mg | INTRAMUSCULAR | Status: DC | PRN

## 2018-01-06 MED ORDER — BUPIVACAINE-EPINEPHRINE (PF) 0.5% -1:200000 IJ SOLN
INTRAMUSCULAR | Status: DC | PRN
  Administered 2018-01-06: 5 mL
  Administered 2018-01-06 (×3): 5 mL via PERINEURAL

## 2018-01-06 MED ORDER — CLONAZEPAM 0.5 MG PO TABS
0.5000 mg | ORAL_TABLET | Freq: Every day | ORAL | Status: DC
  Administered 2018-01-06 – 2018-01-09 (×4): 0.5 mg via ORAL
  Filled 2018-01-06 (×4): qty 1

## 2018-01-06 MED ORDER — TURMERIC CURCUMIN 500 MG PO CAPS: 500.0000 mg | ORAL_CAPSULE | Freq: Every day | ORAL | Status: DC

## 2018-01-06 MED ORDER — MIDAZOLAM HCL 2 MG/2ML IJ SOLN
INTRAMUSCULAR | Status: AC
  Filled 2018-01-06: qty 2

## 2018-01-06 MED ORDER — FENTANYL CITRATE (PF) 100 MCG/2ML IJ SOLN
INTRAMUSCULAR | Status: DC | PRN
  Administered 2018-01-06: 25 ug via INTRAVENOUS
  Administered 2018-01-06: 150 ug via INTRAVENOUS

## 2018-01-06 MED ORDER — EPHEDRINE 5 MG/ML INJ
INTRAVENOUS | Status: AC
  Filled 2018-01-06: qty 10

## 2018-01-06 MED ORDER — MORPHINE SULFATE (PF) 4 MG/ML IV SOLN: 3.0000 mg | INTRAVENOUS | Status: DC | PRN

## 2018-01-06 MED ORDER — SUCCINYLCHOLINE CHLORIDE 200 MG/10ML IV SOSY
PREFILLED_SYRINGE | INTRAVENOUS | Status: AC
  Filled 2018-01-06: qty 10

## 2018-01-06 MED ORDER — HYDROMORPHONE HCL 1 MG/ML IJ SOLN
INTRAMUSCULAR | Status: AC
  Administered 2018-01-06: 0.5 mg via INTRAVENOUS
  Filled 2018-01-06: qty 1

## 2018-01-06 MED ORDER — PROPOFOL 10 MG/ML IV BOLUS
INTRAVENOUS | Status: DC | PRN
  Administered 2018-01-06: 200 mg via INTRAVENOUS

## 2018-01-06 MED ORDER — MIDAZOLAM HCL 2 MG/2ML IJ SOLN
2.0000 mg | Freq: Once | INTRAMUSCULAR | Status: AC
  Administered 2018-01-06: 2 mg via INTRAVENOUS

## 2018-01-06 MED ORDER — FENTANYL CITRATE (PF) 100 MCG/2ML IJ SOLN
INTRAMUSCULAR | Status: AC
  Filled 2018-01-06: qty 2

## 2018-01-06 MED ORDER — FENTANYL CITRATE (PF) 100 MCG/2ML IJ SOLN
100.0000 ug | Freq: Once | INTRAMUSCULAR | Status: AC
  Administered 2018-01-06: 100 ug via INTRAVENOUS

## 2018-01-06 MED ORDER — CLINDAMYCIN PHOSPHATE 900 MG/50ML IV SOLN
900.0000 mg | INTRAVENOUS | Status: AC
  Administered 2018-01-06: 900 mg via INTRAVENOUS
  Filled 2018-01-06: qty 50

## 2018-01-06 MED ORDER — PANTOPRAZOLE SODIUM 40 MG PO TBEC
40.0000 mg | DELAYED_RELEASE_TABLET | Freq: Every day | ORAL | Status: DC
  Administered 2018-01-07 – 2018-01-10 (×4): 40 mg via ORAL
  Filled 2018-01-06 (×4): qty 1

## 2018-01-06 MED ORDER — ONDANSETRON HCL 4 MG/2ML IJ SOLN: 4.0000 mg | Freq: Four times a day (QID) | INTRAMUSCULAR | Status: DC | PRN

## 2018-01-06 MED ORDER — OXYCODONE HCL 5 MG PO TABS
ORAL_TABLET | ORAL | Status: AC
  Administered 2018-01-06: 5 mg
  Filled 2018-01-06: qty 1

## 2018-01-06 MED ORDER — PHENYLEPHRINE 40 MCG/ML (10ML) SYRINGE FOR IV PUSH (FOR BLOOD PRESSURE SUPPORT)
PREFILLED_SYRINGE | INTRAVENOUS | Status: AC
  Filled 2018-01-06: qty 30

## 2018-01-06 MED ORDER — CHLORHEXIDINE GLUCONATE 4 % EX LIQD: 60.0000 mL | Freq: Once | CUTANEOUS | Status: DC

## 2018-01-06 MED ORDER — ONDANSETRON HCL 4 MG/2ML IJ SOLN
INTRAMUSCULAR | Status: AC
  Filled 2018-01-06: qty 2

## 2018-01-06 MED ORDER — DIMENHYDRINATE 50 MG PO TABS
50.0000 mg | ORAL_TABLET | Freq: Three times a day (TID) | ORAL | Status: DC | PRN
  Administered 2018-01-07: 50 mg via ORAL
  Filled 2018-01-06 (×2): qty 1

## 2018-01-06 MED ORDER — LINAGLIPTIN 5 MG PO TABS
5.0000 mg | ORAL_TABLET | Freq: Every day | ORAL | Status: DC
  Administered 2018-01-07 – 2018-01-10 (×4): 5 mg via ORAL
  Filled 2018-01-06 (×4): qty 1

## 2018-01-06 MED ORDER — METHOCARBAMOL 500 MG PO TABS
ORAL_TABLET | ORAL | Status: AC
  Filled 2018-01-06: qty 1

## 2018-01-06 MED ORDER — VANCOMYCIN HCL 500 MG IV SOLR
INTRAVENOUS | Status: AC
  Filled 2018-01-06: qty 500

## 2018-01-06 MED ORDER — DOCUSATE SODIUM 100 MG PO CAPS
100.0000 mg | ORAL_CAPSULE | Freq: Two times a day (BID) | ORAL | Status: DC
  Administered 2018-01-06 – 2018-01-09 (×7): 100 mg via ORAL
  Filled 2018-01-06 (×8): qty 1

## 2018-01-06 MED ORDER — DEXAMETHASONE SODIUM PHOSPHATE 4 MG/ML IJ SOLN
INTRAMUSCULAR | Status: DC | PRN
  Administered 2018-01-06: 5 mg via INTRAVENOUS

## 2018-01-06 MED ORDER — LIRAGLUTIDE 18 MG/3ML ~~LOC~~ SOPN
1.8000 mg | PEN_INJECTOR | Freq: Every day | SUBCUTANEOUS | Status: DC
  Filled 2018-01-06: qty 3

## 2018-01-06 MED ORDER — VITAMIN D (ERGOCALCIFEROL) 1.25 MG (50000 UNIT) PO CAPS
50000.0000 [IU] | ORAL_CAPSULE | ORAL | Status: DC
  Administered 2018-01-09: 50000 [IU] via ORAL
  Filled 2018-01-06: qty 1

## 2018-01-06 MED ORDER — METHOCARBAMOL 1000 MG/10ML IJ SOLN
500.0000 mg | Freq: Four times a day (QID) | INTRAVENOUS | Status: DC | PRN
  Filled 2018-01-06: qty 5

## 2018-01-06 MED ORDER — PROMETHAZINE HCL 25 MG RE SUPP
25.0000 mg | Freq: Once | RECTAL | Status: DC | PRN
  Filled 2018-01-06: qty 1

## 2018-01-06 MED ORDER — LOPERAMIDE HCL 2 MG PO CAPS
2.0000 mg | ORAL_CAPSULE | ORAL | Status: DC
  Administered 2018-01-10: 2 mg via ORAL
  Filled 2018-01-06: qty 1

## 2018-01-06 MED ORDER — PHENYLEPHRINE HCL 10 MG/ML IJ SOLN
INTRAMUSCULAR | Status: DC | PRN
  Administered 2018-01-06 (×2): 80 ug via INTRAVENOUS

## 2018-01-06 MED ORDER — SUCCINYLCHOLINE CHLORIDE 20 MG/ML IJ SOLN
INTRAMUSCULAR | Status: DC | PRN
  Administered 2018-01-06: 140 mg via INTRAVENOUS

## 2018-01-06 MED ORDER — VANCOMYCIN HCL IN DEXTROSE 1-5 GM/200ML-% IV SOLN
1000.0000 mg | Freq: Two times a day (BID) | INTRAVENOUS | Status: AC
  Administered 2018-01-06: 1000 mg via INTRAVENOUS
  Filled 2018-01-06: qty 200

## 2018-01-06 MED ORDER — ASPIRIN EC 81 MG PO TBEC
81.0000 mg | DELAYED_RELEASE_TABLET | Freq: Every day | ORAL | Status: DC
  Administered 2018-01-07 – 2018-01-10 (×4): 81 mg via ORAL
  Filled 2018-01-06 (×5): qty 1

## 2018-01-06 MED ORDER — EZETIMIBE 10 MG PO TABS
10.0000 mg | ORAL_TABLET | Freq: Every day | ORAL | Status: DC
  Administered 2018-01-06 – 2018-01-10 (×5): 10 mg via ORAL
  Filled 2018-01-06 (×5): qty 1

## 2018-01-06 MED ORDER — ZINC SULFATE 220 (50 ZN) MG PO CAPS
220.0000 mg | ORAL_CAPSULE | Freq: Every day | ORAL | Status: DC
  Administered 2018-01-07 – 2018-01-09 (×3): 220 mg via ORAL
  Filled 2018-01-06 (×4): qty 1

## 2018-01-06 MED ORDER — OXYCODONE HCL 5 MG PO TABS
5.0000 mg | ORAL_TABLET | ORAL | Status: DC | PRN
  Administered 2018-01-06: 5 mg via ORAL
  Administered 2018-01-07 (×5): 10 mg via ORAL
  Administered 2018-01-08: 5 mg via ORAL
  Administered 2018-01-08: 10 mg via ORAL
  Administered 2018-01-09 (×2): 5 mg via ORAL
  Administered 2018-01-09: 10 mg via ORAL
  Administered 2018-01-10: 5 mg via ORAL
  Filled 2018-01-06: qty 1
  Filled 2018-01-06 (×6): qty 2
  Filled 2018-01-06: qty 1
  Filled 2018-01-06 (×5): qty 2

## 2018-01-06 MED ORDER — HYDROMORPHONE HCL 1 MG/ML IJ SOLN
0.2500 mg | INTRAMUSCULAR | Status: DC | PRN
  Administered 2018-01-06: 0.5 mg via INTRAVENOUS

## 2018-01-06 MED ORDER — LEVOTHYROXINE SODIUM 50 MCG PO TABS
50.0000 ug | ORAL_TABLET | Freq: Every day | ORAL | Status: DC
  Administered 2018-01-07 – 2018-01-09 (×3): 50 ug via ORAL
  Filled 2018-01-06 (×4): qty 1

## 2018-01-06 MED ORDER — DOCUSATE SODIUM 100 MG PO CAPS: 100.0000 mg | ORAL_CAPSULE | Freq: Two times a day (BID) | ORAL | Status: DC

## 2018-01-06 MED ORDER — FENTANYL CITRATE (PF) 250 MCG/5ML IJ SOLN
INTRAMUSCULAR | Status: AC
  Filled 2018-01-06: qty 5

## 2018-01-06 MED ORDER — METOCLOPRAMIDE HCL 5 MG PO TABS: 5.0000 mg | ORAL_TABLET | Freq: Three times a day (TID) | ORAL | Status: DC | PRN

## 2018-01-06 MED ORDER — MIDAZOLAM HCL 5 MG/5ML IJ SOLN
INTRAMUSCULAR | Status: DC | PRN
  Administered 2018-01-06: 1 mg via INTRAVENOUS

## 2018-01-06 MED ORDER — LANTHANUM CARBONATE 500 MG PO CHEW
2000.0000 mg | CHEWABLE_TABLET | Freq: Three times a day (TID) | ORAL | Status: DC
  Administered 2018-01-06 – 2018-01-10 (×9): 2000 mg via ORAL
  Filled 2018-01-06 (×8): qty 4

## 2018-01-06 MED ORDER — CHLORHEXIDINE GLUCONATE CLOTH 2 % EX PADS
6.0000 | MEDICATED_PAD | Freq: Every day | CUTANEOUS | Status: DC
  Administered 2018-01-07 – 2018-01-10 (×3): 6 via TOPICAL

## 2018-01-06 MED ORDER — PHENOL 1.4 % MT LIQD: 1.0000 | OROMUCOSAL | Status: DC | PRN

## 2018-01-06 MED ORDER — PROPOFOL 10 MG/ML IV BOLUS
INTRAVENOUS | Status: AC
  Filled 2018-01-06: qty 20

## 2018-01-06 MED ORDER — CARVEDILOL 25 MG PO TABS
25.0000 mg | ORAL_TABLET | Freq: Every day | ORAL | Status: DC
  Administered 2018-01-06 – 2018-01-09 (×4): 25 mg via ORAL
  Filled 2018-01-06 (×4): qty 1

## 2018-01-06 MED ORDER — FINASTERIDE 5 MG PO TABS
2.5000 mg | ORAL_TABLET | Freq: Every day | ORAL | Status: DC
  Administered 2018-01-07 – 2018-01-10 (×4): 2.5 mg via ORAL
  Filled 2018-01-06 (×5): qty 0.5

## 2018-01-06 MED ORDER — DEXAMETHASONE SODIUM PHOSPHATE 10 MG/ML IJ SOLN
INTRAMUSCULAR | Status: AC
Start: 2018-01-06 — End: ?
  Filled 2018-01-06: qty 2

## 2018-01-06 MED ORDER — VANCOMYCIN HCL 1000 MG IV SOLR
INTRAVENOUS | Status: AC
  Filled 2018-01-06: qty 1000

## 2018-01-06 MED ORDER — LANTHANUM CARBONATE 500 MG PO CHEW: 500.0000 mg | CHEWABLE_TABLET | ORAL | Status: DC | PRN

## 2018-01-06 MED ORDER — ONDANSETRON HCL 4 MG PO TABS
4.0000 mg | ORAL_TABLET | Freq: Four times a day (QID) | ORAL | Status: DC | PRN
Start: 2018-01-06 — End: 2018-01-10

## 2018-01-06 MED ORDER — METOCLOPRAMIDE HCL 5 MG/ML IJ SOLN: 5.0000 mg | Freq: Three times a day (TID) | INTRAMUSCULAR | Status: DC | PRN

## 2018-01-06 MED ORDER — SODIUM CHLORIDE 0.9 % IV SOLN
INTRAVENOUS | Status: DC
  Administered 2018-01-06: 10 mL/h via INTRAVENOUS

## 2018-01-06 MED ORDER — DIALYVITE 800/ZINC 0.8 MG PO TABS: 1.0000 | ORAL_TABLET | Freq: Every day | ORAL | Status: DC

## 2018-01-06 SURGICAL SUPPLY — 71 items
ALCOHOL 70% 16 OZ (MISCELLANEOUS) ×3 IMPLANT
BLADE SAW SGTL 13X75X1.27 (BLADE) ×3 IMPLANT
CALIBRATOR GLENOID VIP 5-D (SYSTAGENIX WOUND MANAGEMENT) ×3 IMPLANT
CLOSURE WOUND 1/2 X4 (GAUZE/BANDAGES/DRESSINGS) ×1
COVER SURGICAL LIGHT HANDLE (MISCELLANEOUS) ×3 IMPLANT
CUP SUT UNIV REVERS 36+2 RT (Cup) ×3 IMPLANT
DRAPE INCISE IOBAN 66X45 STRL (DRAPES) ×6 IMPLANT
DRAPE ORTHO SPLIT 87X125 STRL (DRAPES) ×3 IMPLANT
DRAPE U-SHAPE 47X51 STRL (DRAPES) ×6 IMPLANT
DRSG AQUACEL AG ADV 3.5X10 (GAUZE/BANDAGES/DRESSINGS) ×3 IMPLANT
DURAPREP 26ML APPLICATOR (WOUND CARE) ×3 IMPLANT
ELECT BLADE 4.0 EZ CLEAN MEGAD (MISCELLANEOUS) ×3
ELECT REM PT RETURN 9FT ADLT (ELECTROSURGICAL) ×3
ELECTRODE BLDE 4.0 EZ CLN MEGD (MISCELLANEOUS) ×1 IMPLANT
ELECTRODE REM PT RTRN 9FT ADLT (ELECTROSURGICAL) ×1 IMPLANT
GLENOID UNI REV MOD 24 +2 LAT (Joint) ×3 IMPLANT
GLENOSPHERE 36 +4 LAT/24 (Joint) ×3 IMPLANT
GLOVE BIOGEL PI IND STRL 7.5 (GLOVE) ×1 IMPLANT
GLOVE BIOGEL PI IND STRL 8 (GLOVE) ×1 IMPLANT
GLOVE BIOGEL PI INDICATOR 7.5 (GLOVE) ×2
GLOVE BIOGEL PI INDICATOR 8 (GLOVE) ×2
GLOVE ECLIPSE 7.0 STRL STRAW (GLOVE) ×3 IMPLANT
GLOVE SURG ORTHO 8.0 STRL STRW (GLOVE) ×3 IMPLANT
GOWN STRL REUS W/ TWL LRG LVL3 (GOWN DISPOSABLE) ×2 IMPLANT
GOWN STRL REUS W/ TWL XL LVL3 (GOWN DISPOSABLE) IMPLANT
GOWN STRL REUS W/TWL LRG LVL3 (GOWN DISPOSABLE) ×4
GOWN STRL REUS W/TWL XL LVL3 (GOWN DISPOSABLE)
HYDROGEN PEROXIDE 16OZ (MISCELLANEOUS) ×3 IMPLANT
KIT BASIN OR (CUSTOM PROCEDURE TRAY) ×3 IMPLANT
KIT TURNOVER KIT B (KITS) ×3 IMPLANT
LINER HUMERAL 36 +3MM SM (Shoulder) ×3 IMPLANT
LOOP VESSEL MAXI BLUE (MISCELLANEOUS) ×3 IMPLANT
MANIFOLD NEPTUNE II (INSTRUMENTS) ×3 IMPLANT
NDL SUT 6 .5 CRC .975X.05 MAYO (NEEDLE) ×1 IMPLANT
NEEDLE MAYO TAPER (NEEDLE) ×2
NS IRRIG 1000ML POUR BTL (IV SOLUTION) ×3 IMPLANT
PACK SHOULDER (CUSTOM PROCEDURE TRAY) ×3 IMPLANT
PAD ARMBOARD 7.5X6 YLW CONV (MISCELLANEOUS) ×6 IMPLANT
PASSER SUT SWANSON 36MM LOOP (INSTRUMENTS) ×3 IMPLANT
PIN SET MODULAR GLENOID SYSTEM (PIN) ×3 IMPLANT
SCREW CENTRAL MODULAR 25 (Screw) ×3 IMPLANT
SCREW PERI LOCK 5.5X16 (Screw) ×3 IMPLANT
SCREW PERIPHERAL 5.5X20 LOCK (Screw) ×3 IMPLANT
SCREW PERIPHERAL 5.5X28 LOCK (Screw) ×6 IMPLANT
SCRUB BETADINE 4OZ XXX (MISCELLANEOUS) ×3 IMPLANT
SLING ARM IMMOBILIZER LRG (SOFTGOODS) ×3 IMPLANT
SOLUTION BETADINE 4OZ (MISCELLANEOUS) ×3 IMPLANT
SPACER SHLD UNI REV 36 +6 (Shoulder) ×2 IMPLANT
SPACER TI 36/+6MM (Shoulder) ×1 IMPLANT
SPONGE LAP 18X18 X RAY DECT (DISPOSABLE) ×3 IMPLANT
STEM HUMERAL UNI REVERS SZ6 (Stem) ×3 IMPLANT
STRIP CLOSURE SKIN 1/2X4 (GAUZE/BANDAGES/DRESSINGS) ×2 IMPLANT
SUCTION FRAZIER HANDLE 10FR (MISCELLANEOUS) ×2
SUCTION TUBE FRAZIER 10FR DISP (MISCELLANEOUS) ×1 IMPLANT
SUT FIBERWIRE #2 38 T-5 BLUE (SUTURE) ×9
SUT MAXBRAID (SUTURE) IMPLANT
SUT MNCRL AB 3-0 PS2 18 (SUTURE) ×3 IMPLANT
SUT SILK 2 0 TIES 10X30 (SUTURE) ×3 IMPLANT
SUT VIC AB 0 CT1 27 (SUTURE) ×8
SUT VIC AB 0 CT1 27XBRD ANBCTR (SUTURE) ×4 IMPLANT
SUT VIC AB 1 CT1 27 (SUTURE) ×4
SUT VIC AB 1 CT1 27XBRD ANBCTR (SUTURE) ×2 IMPLANT
SUT VIC AB 2-0 CT1 27 (SUTURE) ×8
SUT VIC AB 2-0 CT1 TAPERPNT 27 (SUTURE) ×4 IMPLANT
SUT VICRYL 0 AB UR-6 (SUTURE) ×9 IMPLANT
SUTURE FIBERWR #2 38 T-5 BLUE (SUTURE) ×3 IMPLANT
SUTURE TAPE 1.3 40 TPR END (SUTURE) ×2 IMPLANT
SUTURETAPE 1.3 40 TPR END (SUTURE) ×6
TOWEL OR 17X26 10 PK STRL BLUE (TOWEL DISPOSABLE) ×3 IMPLANT
TRAY FOLEY BAG SILVER LF 16FR (CATHETERS) IMPLANT
WATER STERILE IRR 1000ML POUR (IV SOLUTION) ×3 IMPLANT

## 2018-01-06 NOTE — Consult Note (Signed)
Reason for Consult: Continuity of ESRD care Referring Physician: Marcene Duos M.D. (orthopedic surgery)  HPI:  67 year old woman of Hispanic origin with past medical history significant for aortic stenosis, congestive heart failure, coronary artery disease, GERD and end-stage renal disease on hemodialysis on a Monday/Wednesday/Friday schedule in [place].  She reports uneventful dialysis yesterday.  She was admitted today and underwent right reverse shoulder replacement for rotator cuff arthropathy and arthritis and is seen on the floor in a postoperative state when she complains of some pain/discomfort as expected along with some mild nausea.  She denies any chest pain or shortness of breath.  Past Medical History:  Diagnosis Date  . Anemia   . Aortic stenosis   . CHF (congestive heart failure) ([place])   . Childhood asthma   . Chronic upper back pain   . Coronary artery disease    Stent to left main coronary artery second of August 2017  . Decreased hearing   . ESRD (end stage renal disease) on dialysis [facility])    "MWF; [facility] in [place]" (06/22/2017)  . Gait abnormality 08/28/2016  . GERD (gastroesophageal reflux disease)   . Headache    "a few times/week" (10/30/2014)  . Headache syndrome 01/08/2017  . Heart murmur   . History of blood transfusion 1996   "related to menses"  . Hyperlipidemia   . Hypertension   . Hypothyroidism   . IBS (irritable bowel syndrome)   . Myocardial infarction ([place]) 2004  . Osteoarthritis   . Pneumonia    "years ago"  . Rotator cuff arthropathy of right shoulder   . Spondylosis of cervical spine 06/16/2017  . Stroke ([place]) 07/2016   mini stroke , right side of body weak-   . Type II diabetes mellitus ([place])     Past Surgical History:  Procedure Laterality Date  . APPENDECTOMY    . AV FISTULA PLACEMENT Left 12/03/2006   "forearm"  . AV FISTULA REPAIR Left 11/25/2009   "forearm"  . CARPAL TUNNEL RELEASE Right 02/11/2017   Procedure: RIGHT CARPAL  TUNNEL RELEASE;  Surgeon: Daryll Brod, MD;  Location: [place];  Service: Orthopedics;  Laterality: Right;  . CHOLECYSTECTOMY OPEN  1970's  . COLONOSCOPY W/ POLYPECTOMY    . CORONARY ANGIOPLASTY    . CORONARY ANGIOPLASTY WITH STENT PLACEMENT    . INSERTION OF DIALYSIS CATHETER Right 2010   "chest"  . PARATHYROIDECTOMY  2010?   Parathyroid autotransplantation   . REVISON OF ARTERIOVENOUS FISTULA Left 12/04/2013   Procedure: REVISON OF ARTERIOVENOUS FISTULA;  Surgeon: Rosetta Posner, MD;  Location: [place];  Service: Vascular;  Laterality: Left;  . REVISON OF ARTERIOVENOUS FISTULA Left 09/14/6757   Procedure: PLICATION OF LEFT BRACHIOCEPHALIC  ARTERIOVENOUS FISTULA;  Surgeon: Conrad St. Cloud, MD;  Location: [place];  Service: Vascular;  Laterality: Left;  . SHOULDER ARTHROSCOPY W/ ROTATOR CUFF REPAIR Right 06/22/2017   WITH EXTENSIVE DEBRIDEMENT  . SHOULDER ARTHROSCOPY WITH ROTATOR CUFF REPAIR AND SUBACROMIAL DECOMPRESSION Right 06/22/2017   Procedure: RIGHT SHOULDER ARTHROSCOPY WITH EXTENSIVE DEBRIDEMENT;  Surgeon: Mcarthur Rossetti, MD;  Location: [place];  Service: Orthopedics;  Laterality: Right;  . TRIGGER FINGER RELEASE Right 10/22/2017   Procedure: RELEASE RIGHT INDEX TRIGGER FINGER/A-1 PULLEY;  Surgeon: Daryll Brod, MD;  Location: [place];  Service: Orthopedics;  Laterality: Right;  . TUBAL LIGATION  1989  . UMBILICAL HERNIA REPAIR  1990's X 2    Family History  Problem Relation Age of Onset  . Epilepsy Mother   .  Diabetes Mother   . Cancer Mother   . Alcohol abuse Father     Social History:  reports that she has quit smoking. Her smoking use included cigarettes. She quit after 3.00 years of use. She has never used smokeless tobacco. She reports that she has current or past drug history. Drugs: Marijuana and Hydromorphone. She reports that she does not drink alcohol.  Allergies:  Allergies  Allergen Reactions  . Sulfa Antibiotics Anaphylaxis  . Amoxicillin Hives     Has patient had a PCN reaction causing immediate rash, facial/tongue/throat swelling, SOB or lightheadedness with hypotension: No Has patient had a PCN reaction causing severe rash involving mucus membranes or skin necrosis: No Has patient had a PCN reaction that required hospitalization: No Has patient had a PCN reaction occurring within the last 10 years: Unknown If all of the above answers are "NO", then may proceed with Cephalosporin use.   Marland Kitchen Crestor [Rosuvastatin Calcium] Other (See Comments)    Dizziness, couldn't walk  . Ibuprofen Hives  . Naldecon Senior [employer]  . Statins Hives  . Chlorphen-Phenyleph-Asa Rash  . Gabapentin Other (See Comments)    Dizzy    Medications:  Scheduled: . [START ON 01/07/2018] aspirin EC  81 mg Oral Daily  . carvedilol  25 mg Oral QHS  . [START ON 01/07/2018] Chlorhexidine Gluconate Cloth  6 each Topical Q0600  . clonazePAM  0.5 mg Oral QHS  . docusate sodium  100 mg Oral BID  . ezetimibe  10 mg Oral Daily  . fenofibrate  160 mg Oral Daily  . fentaNYL      . [START ON 01/07/2018] finasteride  2.5 mg Oral Q lunch  . lanthanum  2,000 mg Oral TID WC  . [START ON 01/07/2018] levothyroxine  50 mcg Oral QAC breakfast  . [START ON 01/07/2018] linagliptin  5 mg Oral Daily  . liraglutide  1.8 mg Subcutaneous Daily  . [START ON 01/07/2018] loperamide  2 mg Oral Q M,W,F-HD  . methocarbamol      . midazolam      . multivitamin  1 tablet Oral QHS  . nortriptyline  10 mg Oral QHS  . [START ON 01/07/2018] pantoprazole  40 mg Oral Daily  . [START ON 01/09/2018] Vitamin D (Ergocalciferol)  50,000 Units Oral Q30 days  . [START ON 01/07/2018] zinc sulfate  220 mg Oral Q1200    Results for orders placed or performed during the hospital encounter of 01/06/18 (from the past 48 hour(s))  Glucose, capillary     Status: None   Collection Time: 01/06/18  7:51 AM  Result Value Ref Range   Glucose-Capillary 99 70 - 99 mg/dL  I-STAT 4, (NA,K, GLUC, HGB,HCT)     Status:  None   Collection Time: 01/06/18  8:22 AM  Result Value Ref Range   Sodium 139 135 - 145 mmol/L   Potassium 3.8 3.5 - 5.1 mmol/L   Glucose, Bld 96 70 - 99 mg/dL   HCT 38.0 36.0 - 46.0 %   Hemoglobin 12.9 12.0 - 15.0 g/dL  Glucose, capillary     Status: Abnormal   Collection Time: 01/06/18  2:32 PM  Result Value Ref Range   Glucose-Capillary 126 (H) 70 - 99 mg/dL    Dg Shoulder Right Port  Result Date: 01/06/2018 CLINICAL DATA:  Postop EXAM: PORTABLE RIGHT SHOULDER COMPARISON:  Portable exam 1450 hours compared to MR shoulder 06/05/2016 FINDINGS: Components of a reverse RIGHT shoulder arthroplasty are identified. No fracture or dislocation  seen on single AP view. Bones appear demineralized. Normal AC joint alignment. IMPRESSION: RIGHT shoulder prosthesis without definite acute abnormalities. Electronically Signed   By: Lavonia Dana M.D.   On: 01/06/2018 15:00    Review of Systems  Constitutional: Negative.   HENT: Negative.   Eyes: Negative.   Respiratory: Negative.   Cardiovascular: Negative.   Gastrointestinal: Positive for nausea. Negative for heartburn and vomiting.       Reports some postoperative nausea  Genitourinary: Negative.   Musculoskeletal: Positive for joint pain.       Postoperative right shoulder pain  Skin: Negative.   Neurological: Negative.    Blood pressure 125/83, pulse 80, temperature (!) 97.3 F (36.3 C), resp. rate 18, SpO2 100 %. Physical Exam  Nursing note and vitals reviewed. Constitutional: She is oriented to person, place, and time. She appears well-developed and well-nourished. No distress.  HENT:  Head: Normocephalic and atraumatic.  Mouth/Throat: Oropharynx is clear and moist. No oropharyngeal exudate.  Eyes: Pupils are equal, round, and reactive to light. Conjunctivae are normal. No scleral icterus.  Neck: Normal range of motion. No JVD present.  Cardiovascular: Normal rate and regular rhythm.  Murmur heard. 3/6 HSM over outflow tract   Respiratory: Effort normal and breath sounds normal. She has no wheezes. She has no rales.  GI: Soft. Bowel sounds are normal. There is no tenderness. There is no rebound.  Musculoskeletal: She exhibits no edema.  Large clean dressing over right shoulder.  Left brachiocephalic fistula pulsatile.  Neurological: She is alert and oriented to person, place, and time.  Skin: Skin is warm and dry. No erythema.    Assessment/Plan: 1.  Right shoulder rotator cuff arthropathy/arthritis: Status post right reverse shoulder replacement earlier today by Dr. Marlou Sa. 2.  End-stage renal disease: I will order for hemodialysis tomorrow to get her back onto her usual outpatient schedule.  She appears euvolemic and does not have any critical electrolyte abnormalities at this time. 3.  Hypertension: Blood pressure under fair control at this time, continue to monitor on current therapy. 4.  Anemia of chronic kidney disease: Hemoglobin currently acceptable, will monitor with labs tomorrow to decide on need for starting ESA. 5.  Secondary hyperparathyroidism: We will resume phosphorus binders with meals and reconcile medications for VDRA/Sensipar.  Nissim Fleischer K. 01/06/2018, 7:06 PM

## 2018-01-06 NOTE — Brief Op Note (Signed)
01/06/2018  2:19 PM  PATIENT:  Roberta Bryant  67 y.o. female  PRE-OPERATIVE DIAGNOSIS:  RIGHT SHOULDER ROTATOR CUFF ARTHROPATHY  POST-OPERATIVE DIAGNOSIS:  RIGHT SHOULDER ROTATOR CUFF ARTHROPATHY  PROCEDURE:  Procedure(s): RIGHT REVERSE SHOULDER ARTHROPLASTY  SURGEON:  Surgeon(s): [name], MD  ASSISTANT: April green rnfa  ANESTHESIA:   general  EBL: 50 ml    No intake/output data recorded.  BLOOD ADMINISTERED: none  DRAINS: none   LOCAL MEDICATIONS USED:  none  SPECIMEN:  No Specimen  COUNTS:  YES  TOURNIQUET:  * No tourniquets in log *  DICTATION: .Other Dictation: Dictation Number [id number]  PLAN OF CARE: Admit to inpatient   PATIENT DISPOSITION:  PACU - hemodynamically stable

## 2018-01-06 NOTE — Anesthesia Postprocedure Evaluation (Signed)
Anesthesia Post Note  Patient: Roberta Bryant  Procedure(s) Performed: RIGHT REVERSE SHOULDER ARTHROPLASTY (Right )     Patient location during evaluation: PACU Anesthesia Type: General Level of consciousness: awake and sedated Pain management: pain level not controlled Respiratory status: spontaneous breathing Cardiovascular status: stable Postop Assessment: no apparent nausea or vomiting Anesthetic complications: no    Last Vitals:  Vitals:   01/06/18 1530 01/06/18 1545  BP: (!) 154/59 137/74  Pulse: 77 79  Resp: (!) 22 (!) 23  Temp:    SpO2: 100% 100%    Last Pain:  Vitals:   01/06/18 1545  PainSc: 8    Pain Goal: Patients Stated Pain Goal: 3 (01/06/18 1545)               [place]

## 2018-01-06 NOTE — Transfer of Care (Signed)
Immediate Anesthesia Transfer of Care Note  Patient: Roberta Bryant  Procedure(s) Performed: RIGHT REVERSE SHOULDER ARTHROPLASTY (Right )  Patient Location: PACU  Anesthesia Type:GA combined with regional for post-op pain  Level of Consciousness: awake, alert  and oriented  Airway & Oxygen Therapy: Patient Spontanous Breathing and Patient connected to nasal cannula oxygen  Post-op Assessment: Report given to RN and Post -op Vital signs reviewed and stable  Post vital signs: Reviewed and stable  Last Vitals:  Vitals Value Taken Time  BP 148/48 01/06/2018  2:27 PM  Temp    Pulse 77 01/06/2018  2:36 PM  Resp 11 01/06/2018  2:36 PM  SpO2 100 % 01/06/2018  2:36 PM  Vitals shown include unvalidated device data.  Last Pain:  Vitals:   01/06/18 0814  PainSc: 2       Patients Stated Pain Goal: 3 (59/16/38 4665)  Complications: No apparent anesthesia complications

## 2018-01-06 NOTE — Anesthesia Procedure Notes (Signed)
Procedure Name: Intubation Date/Time: 01/06/2018 11:13 AM Performed by: Taseen Marasigan T, CRNA Pre-anesthesia Checklist: Patient identified, Emergency Drugs available, Suction available and Patient being monitored Patient Re-evaluated:Patient Re-evaluated prior to induction Oxygen Delivery Method: Circle system utilized Preoxygenation: Pre-oxygenation with 100% oxygen Induction Type: IV induction Ventilation: Mask ventilation without difficulty and Oral airway inserted - appropriate to patient size Laryngoscope Size: Miller and 2 Grade View: Grade II Tube type: Oral Tube size: 7.5 mm Number of attempts: 1 Airway Equipment and Method: Patient positioned with wedge pillow and Stylet Placement Confirmation: ETT inserted through vocal cords under direct vision,  positive ETCO2 and breath sounds checked- equal and bilateral Secured at: 22 cm Tube secured with: Tape Dental Injury: Teeth and Oropharynx as per pre-operative assessment

## 2018-01-06 NOTE — Op Note (Signed)
NAME: Roberta Bryant, Roberta Bryant MEDICAL RECORD OB:[id number] ACCOUNT [id number] DATE OF BIRTH:04/08/1951 FACILITY: [facility] LOCATION: [facility] PHYSICIAN:GREGORY Randel Pigg, MD  OPERATIVE REPORT  DATE OF PROCEDURE:  01/06/2018  PREOPERATIVE DIAGNOSES:  Right shoulder rotator cuff arthropathy and arthritis.  POSTOPERATIVE DIAGNOSES:  Right shoulder rotator cuff arthropathy and arthritis.  PROCEDURE:  Right reverse shoulder replacement.  SURGEON:  [name], MD  ASSISTANT:  April Green, RNFA   COMPONENTS UTILIZED:  Arthrex humeral stem size 6 with 25 mm central screw and a modular glenoid 24 mm baseplate +2 lateralized with glenosphere 36+4 lateralized.  The patient has 4 peripheral locking screws placed.  INDICATIONS:  Roberta Bryant is a 67 year old patient with end-stage shoulder arthritis and rotator cuff arthropathy who presents for operative management after explanation of risks and benefits.  PROCEDURE IN DETAIL:  The patient was brought to the operating room where general endotracheal anesthesia was induced.  Preoperative antibiotics were administered.  Timeout was called.  Right arm was pre-scrubbed with alcohol and Betadine, allowed to air  dry, and prepped with DuraPrep in a sterile manner.  It should be noted that hydrogen peroxide was also used to clean the arm first.  This included the axilla.  Roberta Bryant was used to cover the operative field.  Timeout was called.  The patient was in the  beach-chair position with the head in neutral position.  The deltopectoral approach was made.  Skin and subcutaneous tissue were sharply divided.  Cephalic vein mobilized medially.  Crossing branches ligated.  Biceps tendon tenodesed to the superior  portion of the pectoralis tendon.  Rotator interval opened up to the base of the coracoid.  The coracohumeral ligament was released.  Subdeltoid and subacromial adhesions were released.  The patient had intact subscap but significant tendinosis present.   Supraspinatus and infraspinatus were torn, and there was severe arthritis in the humeral head and moderate arthritis of the glenoid.  The circumflex vessels were ligated.  Axillary nerve was visualized and palpated, and a vessel loop was placed around  it under direct visualization.  It was protected at all times during the case.  At this time, the subscapularis was detached around to the 5 o'clock position.  The capsule was also removed down about 2 cm inferior to the bottom aspect of the humeral  head.  The shoulder was dislocated.  Humeral head cut was then made in approximately 30 degrees of retroversion at the interface of the greater tuberosity and the rotator cuff attachment.  The cap was placed.  Attention was then directed towards the  glenoid.  The labrum was excised, and a 360-degree capsular release was performed with care being taken to avoid injury to the axillary nerve.  The subscapularis was mobilized.  At this time, the posterior and anterior retractors were placed, and the IP  guide was utilized to get precise implant positioning.  This was performed.  At this time, the glenosphere was placed.  Secure fixation was achieved with a central screw.  Four peripheral locking screws were placed.  At this time, a glenosphere was  placed.  Attention was then directed towards the humerus.  The humerus was then prepared and broached up to a size 6, which gave a very good press fit.  At this time, a size 6 stem was placed.  Bone grafting was performed because in general her bone  quality was not great.  There was a secure press-fit achieved with that implant, however.  At this time, reduction was performed with the +  6 and +9 spacer.  The +9 gave excellent stability and was moderately difficult to reduce.  This gave excellent  range of motion, stability, particularly with adduction, extension and superior-directed force on the humerus.  A trial spacer was removed and true spacer placed with same  stability parameters maintained. Thorough irrigation was performed with 3 L of  irrigating solution.  Vancomycin powder was placed within the joint.  Subscapularis repaired using a suture tape.  This was done with the arm in about 45 degrees of external rotation.  Following this, the deltopectoral interval was closed using #1 Vicryl  suture followed by thorough irrigation.  Vancomycin powder was placed in the next layer.  A 0 Vicryl suture, 2-0 Vicryl suture, and 3-0 Monocryl were then utilized.  Aquacel dressing was placed.  The patient tolerated the procedure well without  immediate complications and transferred to the recovery room in stable condition.  The patient was placed in a sling.  LN/NUANCE  D:01/06/2018 T:01/06/2018 JOB:[id number]

## 2018-01-06 NOTE — Progress Notes (Addendum)
Subjective: Patient stable.  Pain reasonably well controlled.  Block did not work very well postop.   Objective: Vital signs in last 24 hours: Temp:  [97.1 F (36.2 C)-97.8 F (36.6 C)] 97.3 F (36.3 C) (08/01 1600) Pulse Rate:  [66-81] 81 (08/01 1958) Resp:  [13-23] 18 (08/01 1958) BP: (125-217)/(48-83) 156/71 (08/01 1958) SpO2:  [93 %-100 %] 93 % (08/01 1958)  Intake/Output from previous day: No intake/output data recorded. Intake/Output this shift: No intake/output data recorded.  Exam:  Intact pulses distally  Labs: Recent Labs    01/04/18 1525 01/06/18 0822  HGB 10.6* 12.9   Recent Labs    01/04/18 1525 01/06/18 0822  WBC 4.6  --   RBC 2.85*  --   HCT 32.9* 38.0  PLT 201  --    Recent Labs    01/04/18 1525 01/06/18 0822  NA 140 139  K 3.8 3.8  CL 102  --   CO2 27  --   BUN 16  --   CREATININE 5.10*  --   GLUCOSE 87 96  CALCIUM 8.0*  --    No results for input(s): LABPT, INR in the last 72 hours.  Assessment/Plan: Plan at this time is to do dialysis tomorrow.  She is okay to discharge at that time.  She has CPM machine at home and will follow-up with me in 2 weeks prescriptions on chart.   Landry Dyke Samyra Limb 01/06/2018, 10:09 PM

## 2018-01-06 NOTE — H&P (Signed)
Roberta Bryant is an 67 y.o. female.   Chief Complaint: Right shoulder pain HPI: Patient has a long history of right shoulder pain.  She underwent arthroscopy and attempted cuff repair .  She had unrepairable rotator cuff tear.  She has subsequently undergone significant pain and disability related to her right shoulder.  She denies any fevers or chills.  She has pain which interferes with all activities of daily living as well as sleep.  She has very significant functional limitations with the right arm.  At her last arthroscopic intervention the cuff was deemed unrepairable.  Past Medical History:  Diagnosis Date  . Anemia   . Aortic stenosis   . CHF (congestive heart failure) ([place])   . Childhood asthma   . Chronic upper back pain   . Coronary artery disease    Stent to left main coronary artery second of August 2017  . Decreased hearing   . ESRD (end stage renal disease) on dialysis [facility])    "MWF; [facility] in [place]" (06/22/2017)  . Gait abnormality 08/28/2016  . GERD (gastroesophageal reflux disease)   . Headache    "a few times/week" (10/30/2014)  . Headache syndrome 01/08/2017  . Heart murmur   . History of blood transfusion 1996   "related to menses"  . Hyperlipidemia   . Hypertension   . Hypothyroidism   . IBS (irritable bowel syndrome)   . Myocardial infarction ([place]) 2004  . Osteoarthritis   . Pneumonia    "years ago"  . Rotator cuff arthropathy of right shoulder   . Spondylosis of cervical spine 06/16/2017  . Stroke ([place]) 07/2016   mini stroke , right side of body weak-   . Type II diabetes mellitus ([place])     Past Surgical History:  Procedure Laterality Date  . APPENDECTOMY    . AV FISTULA PLACEMENT Left 12/03/2006   "forearm"  . AV FISTULA REPAIR Left 11/25/2009   "forearm"  . CARPAL TUNNEL RELEASE Right 02/11/2017   Procedure: RIGHT CARPAL TUNNEL RELEASE;  Surgeon: Daryll Brod, MD;  Location: [place];  Service: Orthopedics;  Laterality:  Right;  . CHOLECYSTECTOMY OPEN  1970's  . COLONOSCOPY W/ POLYPECTOMY    . CORONARY ANGIOPLASTY    . CORONARY ANGIOPLASTY WITH STENT PLACEMENT    . INSERTION OF DIALYSIS CATHETER Right 2010   "chest"  . PARATHYROIDECTOMY  2010?   Parathyroid autotransplantation   . REVISON OF ARTERIOVENOUS FISTULA Left 12/04/2013   Procedure: REVISON OF ARTERIOVENOUS FISTULA;  Surgeon: Rosetta Posner, MD;  Location: [place];  Service: Vascular;  Laterality: Left;  . REVISON OF ARTERIOVENOUS FISTULA Left 11/14/6293   Procedure: PLICATION OF LEFT BRACHIOCEPHALIC  ARTERIOVENOUS FISTULA;  Surgeon: Conrad Heathrow, MD;  Location: [place];  Service: Vascular;  Laterality: Left;  . SHOULDER ARTHROSCOPY W/ ROTATOR CUFF REPAIR Right 06/22/2017   WITH EXTENSIVE DEBRIDEMENT  . SHOULDER ARTHROSCOPY WITH ROTATOR CUFF REPAIR AND SUBACROMIAL DECOMPRESSION Right 06/22/2017   Procedure: RIGHT SHOULDER ARTHROSCOPY WITH EXTENSIVE DEBRIDEMENT;  Surgeon: Mcarthur Rossetti, MD;  Location: [place];  Service: Orthopedics;  Laterality: Right;  . TRIGGER FINGER RELEASE Right 10/22/2017   Procedure: RELEASE RIGHT INDEX TRIGGER FINGER/A-1 PULLEY;  Surgeon: Daryll Brod, MD;  Location: [place];  Service: Orthopedics;  Laterality: Right;  . TUBAL LIGATION  1989  . UMBILICAL HERNIA REPAIR  1990's X 2    Family History  Problem Relation Age of Onset  . Epilepsy Mother   . Diabetes Mother   .  Cancer Mother   . Alcohol abuse Father    Social History:  reports that she has quit smoking. Her smoking use included cigarettes. She quit after 3.00 years of use. She has never used smokeless tobacco. She reports that she has current or past drug history. Drugs: Marijuana and Hydromorphone. She reports that she does not drink alcohol.  Allergies:  Allergies  Allergen Reactions  . Sulfa Antibiotics Anaphylaxis  . Amoxicillin Hives    Has patient had a PCN reaction causing immediate rash, facial/tongue/throat swelling, SOB or lightheadedness with  hypotension: No Has patient had a PCN reaction causing severe rash involving mucus membranes or skin necrosis: No Has patient had a PCN reaction that required hospitalization: No Has patient had a PCN reaction occurring within the last 10 years: Unknown If all of the above answers are "NO", then may proceed with Cephalosporin use.   Marland Kitchen Crestor [Rosuvastatin Calcium] Other (See Comments)    Dizziness, couldn't walk  . Ibuprofen Hives  . Naldecon Senior [employer]  . Statins Hives  . Chlorphen-Phenyleph-Asa Rash  . Gabapentin Other (See Comments)    Dizzy    Medications Prior to Admission  Medication Sig Dispense Refill  . acetaminophen (TYLENOL) 500 MG tablet Take 500-1,000 mg by mouth every 6 (six) hours as needed (FOR PAIN.).    Marland Kitchen aspirin EC 81 MG tablet Take 81 mg by mouth daily.     . B Complex-C-Zn-Folic Acid (DIALYVITE 626 WITH ZINC) 0.8 MG TABS Take 1 tablet by mouth daily.  12  . carvedilol (COREG) 12.5 MG tablet Take 25 mg by mouth at bedtime.   2  . ciprofloxacin (CIPRO) 500 MG tablet 1 po q d x 3 days prior to surgical procedure 3 tablet 0  . clonazePAM (KLONOPIN) 0.5 MG tablet TAKE 1 TABLET BY MOUTH AT BEDTIME 30 tablet 2  . dimenhyDRINATE (DRAMAMINE) 50 MG tablet Take 50 mg by mouth every 8 (eight) hours as needed (for dizziness/vertigo).    Marland Kitchen docusate sodium (COLACE) 100 MG capsule Take 100 mg by mouth 2 (two) times daily.    Marland Kitchen ezetimibe (ZETIA) 10 MG tablet Take 10 mg by mouth daily.  1  . fenofibrate 160 MG tablet Take 160 mg by mouth daily.  1  . finasteride (PROSCAR) 5 MG tablet Take 2.5 mg by mouth daily with lunch.     Marland Kitchen HYDROcodone-acetaminophen (NORCO) 5-325 MG tablet Take 1 tablet by mouth every 6 (six) hours as needed for moderate pain. 30 tablet 0  . HYDROcodone-acetaminophen (NORCO/VICODIN) 5-325 MG tablet Take 1 tablet by mouth every 8 (eight) hours as needed for moderate pain. 40 tablet 0  . lanthanum (FOSRENOL) 1000 MG chewable tablet Chew 2,000 mg  by mouth See admin instructions. TAKE 2 TABLETS (2000 MG) BY MOUTH DAILY AFTER EACH MEALS & 1 TABLET (1000 MG) BY MOUTH WITH SNACKS.    Marland Kitchen levothyroxine (SYNTHROID, LEVOTHROID) 50 MCG tablet Take 50 mcg by mouth daily before breakfast.     . lidocaine-prilocaine (EMLA) cream Apply 1 application topically See admin instructions. Apply a small amount to access site 1-2 hours before dialysis--cover with occlusive dressing  12  . liraglutide (VICTOZA) 18 MG/3ML SOPN Inject 1.8 mg into the skin daily.     Marland Kitchen loperamide (IMODIUM A-D) 2 MG tablet Take 2 mg by mouth every Monday, Wednesday, and Friday with hemodialysis.     Marland Kitchen nortriptyline (PAMELOR) 10 MG capsule Take one capsule at night for one week, then take 2 capsules at  night (Patient taking differently: Take 10 mg by mouth at bedtime. ) 60 capsule 3  . omeprazole (PRILOSEC) 20 MG capsule Take 40 mg by mouth 2 (two) times daily before a meal.     . sitaGLIPtin (JANUVIA) 25 MG tablet Take 25 mg by mouth daily with lunch.     . Turmeric Curcumin 500 MG CAPS Take 500 mg by mouth daily with lunch.    . Vitamin D, Ergocalciferol, (DRISDOL) 50000 units CAPS capsule Take 50,000 Units by mouth every 30 (thirty) days.  2  . Zinc 50 MG CAPS Take 50 mg by mouth daily with lunch.       Results for orders placed or performed during the hospital encounter of 01/06/18 (from the past 48 hour(s))  Glucose, capillary     Status: None   Collection Time: 01/06/18  7:51 AM  Result Value Ref Range   Glucose-Capillary 99 70 - 99 mg/dL   No results found.  Review of Systems  Musculoskeletal: Positive for joint pain.  All other systems reviewed and are negative.   Blood pressure (!) 183/69, pulse 66, temperature 97.8 F (36.6 C), resp. rate 20, SpO2 99 %. Physical Exam  Constitutional: She appears well-developed.  HENT:  Head: Normocephalic.  Eyes: Pupils are equal, round, and reactive to light.  Cardiovascular: Normal rate.  Neurological: She is alert.  Skin:  Skin is warm.  Psychiatric: She has a normal mood and affect.  Examination of the right shoulder demonstrates significant pain with attempts at passive and active range of motion.  Deltoid does fire.  Cuff weakness is present.  Radial pulse intact in the right hand.  Neck range of motion mildly tender.  No evidence of warmth joint swelling or fluctuance is present.  Assessment/Plan  Impression is rotator cuff arthropathy and significant pain in the right shoulder.  Patient has multiple medical comorbidities which increase the risk of complications from any type of surgical intervention. This includes the risk of infection from arthroplasty.  Nonetheless the patient wants to proceed with reverse shoulder replacement in order to achieve a higher level of function with less pain.  The risk and benefits of surgery are discussed including but not limited to infection nerve vessel damage dislocation and potential need for more surgery.  Patient understands risk benefits.  All questions answered  Anderson Malta, MD 01/06/2018, 8:24 AM

## 2018-01-06 NOTE — Anesthesia Preprocedure Evaluation (Addendum)
Anesthesia Evaluation  Patient identified by MRN, date of birth, ID band Patient awake    Reviewed: Allergy & Precautions, NPO status , Patient's Chart, lab work & pertinent test results  Airway Mallampati: II       Dental no notable dental hx. (+) Teeth Intact   Pulmonary former smoker,    Pulmonary exam normal breath sounds clear to auscultation       Cardiovascular hypertension, Pt. on home beta blockers + CAD and + Cardiac Stents  Normal cardiovascular exam Rhythm:Regular Rate:Normal     Neuro/Psych    GI/Hepatic GERD  Medicated,  Endo/Other  diabetes, Type 2, Oral Hypoglycemic AgentsHypothyroidism   Renal/GU DialysisRenal disease     Musculoskeletal   Abdominal (+) + obese,   Peds  Hematology   Anesthesia Other Findings Roberta Bryant  ECHO LIMITED WO IMAGE ENHANCING AGENT W COLOR & SPECT  Order# [id number]  Reading physician: Jenean Lindau, MD Ordering physician: Park Liter, MD Study date: 12/28/17 Result Notes for ECHOCARDIOGRAM LIMITED   Notes recorded by Mattie Marlin, RN on 12/29/2017 at 4:09 PM EDT Informed son (per DPR) of results; informed to call the office with any further questions or concerns. Routed result to ortho.  ------  Notes recorded by Park Liter, MD on 12/29/2017 at 1:09 PM EDT Echo - mod AS,  Ok to proceed with surgery   Study Result   Result status: Final result                             *[facility]*                         [address], [place] [address]                            [phone]  ------------------------------------------------------------------- Transthoracic Echocardiography  Patient:    Roberta, Bryant MR #:       [id number] Study Date: 12/28/2017 Gender:     F Age:        67 Height:     149.2 cm Weight:     85.6 kg BSA:         1.93 m^2 Pt. Status: Room:   ATTENDING    [name], MD  ORDERING     [name], MD  REFERRING    [name], MD  SONOGRAPHER  Jimmy Reel, RDCS  PERFORMING   [facility], [place]  cc:  ------------------------------------------------------------------- LV EF: 60% -   65%  ------------------------------------------------------------------- Indications:      Aortic stenosis 424.1.  CAD of native vessels 414.01.  ------------------------------------------------------------------- History:   PMH:   Chest pain.  Risk factors:  Diabetes mellitus.   ------------------------------------------------------------------- Study Conclusions  - Left ventricle: The cavity size was normal. Wall thickness was   increased in a pattern of mild LVH. Systolic function was normal.   The estimated ejection fraction was in the range of 60% to 65%.   Wall motion was normal; there were no regional wall motion   abnormalities. - Aortic valve: There was moderate stenosis. Valve area (VTI):  1.07   cm^2. Valve area (Vmax): 1.07 cm^2. Valve area (Vmean): 0.96   cm^2. - Left atrium: The atrium was mildly dilated.  Impressions:  - 1. Moderate aortic stenosis. Mild regurgitation.   2. Normal left ventricular systolic function visually estimated   ejection fraction is 60 to 65%. Mild concentric left ventricular   hypertrophy.   3. Mild left atrial enlargement.  ------------------------------------------------------------------- Study data:  The previous study was not available, so comparison was made to the report of 08/18/2017.  Procedure:  The patient reported no pain pre or post test. Transthoracic echocardiography. Image quality was adequate   Progress Notes   This note has been shared with the patient  Signed     Date of Service:  12/14/2017 11:59 PM         Related encounter: Pre-Admission Testing 60 from 12/14/2017 in [facility] PREADMISSION TESTING        Signed                Anesthesia Chart Review:          Case:  [id number]   Date/Time:  12/21/17 1200        Procedure:  RIGHT REVERSE SHOULDER ARTHROPLASTY (Right Shoulder)        Anesthesia type:  General        Pre-op diagnosis:  RIGHT SHOULDER OSTEOARTHRITIS        Location:  [place] OR ROOM 10 / [place] OR        Surgeon:  [name], MD                DISCUSSION:  - Pt is a 67 year old female with hx CAD (DES to LM 01/2016), aortic stenosis (moderate to severe by 08/2017 echo), CHF, ESRD on hemodialysis, HTN, DM.      - Pt has cardiac clearance for surgery dated this past April. However, Dr. Agustin Cree saw pt for f/u 10/25/17 and ordered echo.  Echo is currently scheduled for after surgery.  I reached out to Dr. Joycelyn Rua about whether or not the pt needs the echo prior to surgery. Per Caryl Pina, RN in his office, "Dr. Agustin Cree would like for this patient to have her echo completed before surgery". I notified Debbie in Dr. Randel Pigg office.          VS: BP 133/71   Pulse 73   Temp 36.8 C   Resp 20   Ht 4' 10.75" (1.492 m)   Wt 188 lb 11.4 oz (85.6 kg)   SpO2 99%   BMI 38.44 kg/m         PROVIDERS:  - Receives primary care at [facility] in [place] is [name], MD.  Last office visit 10/25/17  - Nephrologist is Edrick Oh, MD  - Neurologist is Margette Fast, MD           LABS: Labs reviewed: Acceptable for surgery.   - Renal function consistent with ESRD     (all labs ordered are listed, but only abnormal results are displayed)           Labs Reviewed    GLUCOSE, CAPILLARY - Abnormal; Notable for the following components:        Result   Value        Glucose-Capillary   134 (*)            All other components within normal limits    BASIC METABOLIC PANEL - Abnormal; Notable  for the following components:        Creatinine, Ser   5.91  (*)            Calcium   8.2 (*)            GFR calc non Af Amer   7 (*)            GFR calc Af Amer   8 (*)            All other components within normal limits    CBC - Abnormal; Notable for the following components:        RBC   3.04 (*)            Hemoglobin   11.2 (*)            HCT   34.5 (*)            MCV   113.5 (*)            MCH   36.8 (*)            RDW   16.2 (*)            All other components within normal limits    URINALYSIS, ROUTINE W REFLEX MICROSCOPIC - Abnormal; Notable for the following components:        APPearance   CLOUDY (*)            pH   >9.0 (*)            Hgb urine dipstick   TRACE (*)            Protein, ur   100 (*)            Leukocytes, UA   MODERATE (*)            All other components within normal limits    HEMOGLOBIN A1C - Abnormal; Notable for the following components:        Hgb A1c MFr Bld   4.5 (*)            All other components within normal limits    URINALYSIS, MICROSCOPIC (REFLEX) - Abnormal; Notable for the following components:        Bacteria, UA   FEW (*)            All other components within normal limits    SURGICAL PCR SCREEN    URINE CULTURE          IMAGES:     CXR 10/04/17: No acute cardiopulmonary process. Basilar atelectasis/scarring.        EKG 10/03/17: NSR. ST & T wave abnormality, consider lateral ischemia. Prolonged QT. Appears stable when compared to EKG 10/09/16        CV:     Nuclear stress test 09/20/17:   .Nuclear stress EF: 52%.   .There was no ST segment deviation noted during stress.   .The study is normal.   .This is a low risk study.   .The left ventricular ejection fraction is mildly decreased (45-54%).   - Diaphragmatic attenuation no ischemia or infarct EF 52%     Echo 08/18/17:   - Left ventricle: The cavity  size was normal. Wall thickness was at the upper limits of normal. Systolic function was mildly to moderately reduced. The estimated ejection fraction was in the range of 40% to 45%. Diffuse hypokinesis. Doppler parameters are consistent with abnormal left ventricular relaxation (grade 1 diastolic dysfunction). Doppler parameters  are consistent with high ventricular filling pressure.  - Aortic valve: Valve mobility was moderately restricted. There was moderate to severe stenosis. There was mild regurgitation. Mean gradient (S): 24 mm Hg. Peak gradient (S): 41 mm Hg. VTI ratio of LVOT to aortic valve: 0.23. Valve area (VTI): 0.8 cm^2. Valve area (Vmax): 0.9 cm^2. Valve area (Vmean): 0.88 cm^2. SVI 30 cc/min.  - Mitral valve: Mildly calcified annulus. Valve area by continuity    equation (using LVOT flow): 1.8 cm^2.  - Left atrium: The atrium was mildly dilated.  - Right ventricle: Systolic function was mildly reduced.     Carotid duplex 09/11/16:   1. Bilateral carotid bifurcation and proximal ICA plaque, resulting in less than 50% diameter stenosis.  2.  Antegrade bilateral vertebral arterial flow.     Cardiac cath 01/06/16 (care everywhere):  1. Severe stenosis of the left main coronary artery. S/p DES to LM 01/08/16 (60% stenosis reduced to 10%)  2. Otherwise nonobstructive CAD (LAD, CX, and RCA with <20%)                Past Medical History:    Diagnosis   Date    .   Anemia        .   Aortic stenosis        .   CHF (congestive heart failure) ([place])        .   Childhood asthma        .   Chronic upper back pain        .   Coronary artery disease            Stent to left main coronary artery second of August 2017    .   Decreased hearing        .   ESRD (end stage renal disease) on dialysis ([place])            "TTS; [facility] in [place]" (06/22/2017)    .   Gait abnormality   08/28/2016    .   GERD  (gastroesophageal reflux disease)        .   Headache            "a few times/week" (10/30/2014)    .   Headache syndrome   01/08/2017    .   Heart murmur        .   History of blood transfusion   1996        "related to menses"    .   Hyperlipidemia        .   Hypertension        .   Hypothyroidism        .   IBS (irritable bowel syndrome)        .   Myocardial infarction ([place])   2004    .   Osteoarthritis        .   Pneumonia            "years ago"    .   Rotator cuff arthropathy of right shoulder        .   Spondylosis of cervical spine   06/16/2017    .   Stroke ([place])   07/2016        mini stroke , right side of body weak-     .   Type II diabetes mellitus ([place])  Past Surgical History:    Procedure   Laterality   Date    .   APPENDECTOMY            .   AV FISTULA PLACEMENT   Left   12/03/2006        "forearm"    .   AV FISTULA REPAIR   Left   11/25/2009        "forearm"    .   CARPAL TUNNEL RELEASE   Right   02/11/2017        Procedure: RIGHT CARPAL TUNNEL RELEASE;  Surgeon: Daryll Brod, MD;  Location: [place];  Service: Orthopedics;  Laterality: Right;    .   CHOLECYSTECTOMY OPEN       1970's    .   COLONOSCOPY W/ POLYPECTOMY            .   CORONARY ANGIOPLASTY            .   CORONARY ANGIOPLASTY WITH STENT PLACEMENT            .   INSERTION OF DIALYSIS CATHETER   Right   2010        "chest"    .   PARATHYROIDECTOMY       2010?        Parathyroid autotransplantation     .   REVISON OF ARTERIOVENOUS FISTULA   Left   12/04/2013        Procedure: REVISON OF ARTERIOVENOUS FISTULA;  Surgeon: Rosetta Posner, MD;  Location: [place];  Service: Vascular;  Laterality: Left;    .   REVISON OF ARTERIOVENOUS FISTULA   Left    11/11/2016        Procedure: PLICATION OF LEFT BRACHIOCEPHALIC  ARTERIOVENOUS FISTULA;  Surgeon: Conrad [place], MD;  Location: [place];  Service: Vascular;  Laterality: Left;    .   SHOULDER ARTHROSCOPY W/ ROTATOR CUFF REPAIR   Right   06/22/2017        WITH EXTENSIVE DEBRIDEMENT    .   SHOULDER ARTHROSCOPY WITH ROTATOR CUFF REPAIR AND SUBACROMIAL DECOMPRESSION   Right   06/22/2017        Procedure: RIGHT SHOULDER ARTHROSCOPY WITH EXTENSIVE DEBRIDEMENT;  Surgeon: Mcarthur Rossetti, MD;  Location: [place];  Service: Orthopedics;  Laterality: Right;    .   TRIGGER FINGER RELEASE   Right   10/22/2017        Procedure: RELEASE RIGHT INDEX TRIGGER FINGER/A-1 PULLEY;  Surgeon: Daryll Brod, MD;  Location: [place];  Service: Orthopedics;  Laterality: Right;    .   TUBAL LIGATION       1989    .   UMBILICAL HERNIA REPAIR       1990's X 2          MEDICATIONS:   .   acetaminophen (TYLENOL) 325 MG tablet    .   aspirin EC 81 MG tablet    .   B Complex-C-Zn-Folic Acid (DIALYVITE 591 WITH ZINC) 0.8 MG TABS    .   carvedilol (COREG) 12.5 MG tablet    .   clonazePAM (KLONOPIN) 0.5 MG tablet    .   dimenhyDRINATE (DRAMAMINE) 50 MG tablet    .   docusate sodium (COLACE) 100 MG capsule    .   ezetimibe (ZETIA) 10 MG tablet    .   fenofibrate 160 MG tablet    .  finasteride (PROSCAR) 5 MG tablet    .   HYDROcodone-acetaminophen (NORCO) 5-325 MG tablet    .   lanthanum (FOSRENOL) 1000 MG chewable tablet    .   levothyroxine (SYNTHROID, LEVOTHROID) 50 MCG tablet    .   lidocaine-prilocaine (EMLA) cream    .   liraglutide (VICTOZA) 18 MG/3ML SOPN    .   loperamide (IMODIUM A-D) 2 MG tablet    .   nortriptyline (PAMELOR) 10 MG capsule    .   omeprazole (PRILOSEC) 20 MG capsule    .   sitaGLIPtin (JANUVIA) 25 MG tablet    .   Turmeric Curcumin 500 MG CAPS    .    Vitamin D, Ergocalciferol, (DRISDOL) 50000 units CAPS capsule    .   Zinc 50 MG CAPS        No current facility-administered medications for this encounter.           Willeen Cass, FNP-BC  [facility]/Anesthesiology  Phone: [phone]  12/17/2017 4:13 PM                   Reproductive/Obstetrics                            Anesthesia Physical Anesthesia Plan  ASA: III  Anesthesia Plan: General   Post-op Pain Management:  Regional for Post-op pain   Induction: Intravenous  PONV Risk Score and Plan: 3 and Ondansetron and Dexamethasone  Airway Management Planned: Oral ETT  Additional Equipment:   Intra-op Plan:   Post-operative Plan: Extubation in OR  Informed Consent: I have reviewed the patients History and Physical, chart, labs and discussed the procedure including the risks, benefits and alternatives for the proposed anesthesia with the patient or authorized representative who has indicated his/her understanding and acceptance.   Dental advisory given  Plan Discussed with: CRNA and Surgeon  Anesthesia Plan Comments:         Anesthesia Quick Evaluation

## 2018-01-06 NOTE — Progress Notes (Signed)
Patient received from OR with 20g PIV in right hand presenting with large edematous area proximal to insertion site. Extremity cold. Dr. Jillyn Hidden notified of status. En route to bedside for evaluation.

## 2018-01-06 NOTE — Progress Notes (Signed)
1630 Received pt from PACU, A&O x4. Right shoulder incision w/ Aquacell dressing dry and intact. Right hand swelling noted due to IV infiltration found in  PACU. Right arm in a sling, elevated in pillow. Left EJ IV site w/ dressing dry and intact.

## 2018-01-06 NOTE — Anesthesia Procedure Notes (Signed)
Anesthesia Regional Block: Interscalene brachial plexus block   Pre-Anesthetic Checklist: ,, timeout performed, Correct Patient, Correct Site, Correct Laterality, Correct Procedure, Correct Position, site marked, Risks and benefits discussed,  Surgical consent,  Pre-op evaluation,  At surgeon's request and post-op pain management  Laterality: Right and Lower  Prep: chloraprep       Needles:  Injection technique: Single-shot  Needle Type: Echogenic Stimulator Needle     Needle Length: 10cm  Needle Gauge: 21   Needle insertion depth: 2 cm   Additional Needles:   Procedures:,,,, ultrasound used (permanent image in chart),,,,  Narrative:  Start time: 01/06/2018 9:12 AM End time: 01/06/2018 9:32 AM Injection made incrementally with aspirations every 5 mL.  Performed by: Personally  Anesthesiologist: Lyn Hollingshead, MD

## 2018-01-07 ENCOUNTER — Encounter (HOSPITAL_COMMUNITY): Payer: Self-pay | Admitting: Orthopedic Surgery

## 2018-01-07 MED ORDER — LIDOCAINE-PRILOCAINE 2.5-2.5 % EX CREA
1.0000 "application " | TOPICAL_CREAM | CUTANEOUS | Status: DC | PRN
  Filled 2018-01-07: qty 5

## 2018-01-07 MED ORDER — OXYCODONE HCL 5 MG PO TABS
ORAL_TABLET | ORAL | Status: AC
  Filled 2018-01-07: qty 2

## 2018-01-07 MED ORDER — LIDOCAINE HCL (PF) 1 % IJ SOLN: 5.0000 mL | INTRAMUSCULAR | Status: DC | PRN

## 2018-01-07 MED ORDER — PENTAFLUOROPROP-TETRAFLUOROETH EX AERO: 1.0000 "application " | INHALATION_SPRAY | CUTANEOUS | Status: DC | PRN

## 2018-01-07 MED ORDER — SODIUM CHLORIDE 0.9 % IV SOLN: 100.0000 mL | INTRAVENOUS | Status: DC | PRN

## 2018-01-07 MED ORDER — ALTEPLASE 2 MG IJ SOLR
2.0000 mg | Freq: Once | INTRAMUSCULAR | Status: DC | PRN
  Filled 2018-01-07: qty 2

## 2018-01-07 NOTE — Evaluation (Signed)
Physical Therapy Evaluation Patient Details Name: Roberta Bryant MRN: [id number] DOB: [birth year] Today's Date: 01/07/2018   History of Present Illness  Pt is a 67 y/o female s/p R reverse shoulder replacement after increased pain with R shoudler rotator cuff arthropathy and arthritis. PMH signifincant for but not limited to: anemia, CHF, HTN, IBS, myocardial infarction, OA, pneumonia, RC arthropathy of R shoulder, CVA, Type 2 DM.     Clinical Impression  Pt presented supine in bed with HOB elevated, awake and willing to participate in therapy session. Pt's son present throughout session as well. Pt very limited this session secondary to pain, fatigue and weakness. She currently requires mod A for bed mobility, mod A for transfers and mod A to ambulate a very short distance with 1HHA. Pt would continue to benefit from skilled physical therapy services at this time while admitted and after d/c to address the below listed limitations in order to improve overall safety and independence with functional mobility.     Follow Up Recommendations SNF;Supervision/Assistance - 24 hour    Equipment Recommendations  None recommended by PT    Recommendations for Other Services       Precautions / Restrictions Precautions Precautions: Fall;Shoulder Type of Shoulder Precautions: R UE Passive protocol: no AROM shoulder, OK hand, wrist, elbow; passive shoulder FF90, ab 60, ER 60  Shoulder Interventions: Shoulder sling/immobilizer;At all times;Off for dressing/bathing/exercises Precaution Booklet Issued: Yes (comment) Precaution Comments: reviewed precautions with patient and son  Required Braces or Orthoses: Sling Restrictions Weight Bearing Restrictions: Yes RUE Weight Bearing: Non weight bearing      Mobility  Bed Mobility Overal bed mobility: Needs Assistance Bed Mobility: Supine to Sit;Sit to Supine     Supine to sit: Mod assist;HOB elevated Sit to supine: Mod assist   General bed  mobility comments: increased time and effort, cueing for safety, assist to elevate trunk and assist with bilateral LEs back onto bed  Transfers Overall transfer level: Needs assistance Equipment used: 1 person hand held assist Transfers: Sit to/from Stand Sit to Stand: Mod assist         General transfer comment: mod A to power into standing and for stability  Ambulation/Gait Ambulation/Gait assistance: Mod assist Gait Distance (Feet): 15 Feet Assistive device: 1 person hand held assist Gait Pattern/deviations: Step-to pattern;Step-through pattern;Decreased step length - right;Decreased step length - left;Decreased stride length;Trendelenburg Gait velocity: decreased Gait velocity interpretation: <1.31 ft/sec, indicative of household ambulator General Gait Details: pt with modest instability with ambulation requiring mod A with 1HHA on L UE; pt very limited secondary to fatigue  Stairs            Wheelchair Mobility    Modified Rankin (Stroke Patients Only)       Balance Overall balance assessment: Needs assistance Sitting-balance support: Feet supported Sitting balance-Leahy Scale: Fair     Standing balance support: During functional activity;Single extremity supported Standing balance-Leahy Scale: Poor Standing balance comment: reliant on 1 UE support                             Pertinent Vitals/Pain Pain Assessment: Faces Pain Score: 7  Faces Pain Scale: Hurts whole lot Pain Location: R shoulder Pain Descriptors / Indicators: Discomfort;Grimacing;Operative site guarding;Aching Pain Intervention(s): Monitored during session;Repositioned    Home Living Family/patient expects to be discharged to:: Private residence Living Arrangements: Children Available Help at Discharge: Family;Available PRN/intermittently Type of Home: House Home Access: Ramped entrance  Home Layout: One level Home Equipment: Walker - standard;Wheelchair -  [employer];Shower seat - built in;Grab bars - tub/shower Additional Comments: live with 2 sons (diego and sergio), work opposite shifts has assist 75% of the time     Prior Function Level of Independence: Needs assistance   Gait / Transfers Assistance Needed: pt reported that she ambulated with RW; however, at end of session stated that she was using a w/c primarily for mobility since her stroke in Feb/March of 2018  ADL's / Homemaking Assistance Needed: reports requring assistance with LB dressing, able to complete UB due to wearing hospital gowns, did not complete IADLs         Hand Dominance   Dominant Hand: Right    Extremity/Trunk Assessment   Upper Extremity Assessment Upper Extremity Assessment: Defer to OT evaluation RUE Deficits / Details: s/p shoulder surgery, limited per precautions and pain (WFL elbow and distal ) RUE: Unable to fully assess due to immobilization;Unable to fully assess due to pain RUE Sensation: WNL RUE Coordination: decreased fine motor;decreased gross motor    Lower Extremity Assessment Lower Extremity Assessment: Generalized weakness       Communication   Communication: HOH  Cognition Arousal/Alertness: Awake/alert Behavior During Therapy: WFL for tasks assessed/performed Overall Cognitive Status: Impaired/Different from baseline Area of Impairment: Attention;Memory;Safety/judgement;Awareness;Following commands;Problem solving                   Current Attention Level: Selective Memory: Decreased recall of precautions Following Commands: Follows multi-step commands inconsistently Safety/Judgement: Decreased awareness of safety;Decreased awareness of deficits Awareness: Emergent Problem Solving: Slow processing;Decreased initiation;Difficulty sequencing;Requires verbal cues General Comments: Improving awareness and understanding of deficits, as well as needs after dc.      General Comments General comments (skin  integrity, edema, etc.): Son present.  Discussed rehab at SNF, patient agreeable with recommendations. Reports increased pain in R shoulder, as patients son grabbed her shoulder to prevent her from "slipping off" edge of bed prior to session.    Exercises Shoulder Exercises Pendulum Exercise: (able but not completed, patient unable to tolerate) Shoulder Flexion: PROM;Right;10 reps;Supine(5-10 degrees from neutral, pt guarding motion ) Shoulder ABduction: PROM;Self ROM;10 reps;Right;Supine(to 60 ONLY, therapist supporting under elbow) Shoulder External Rotation: PROM;Self ROM;10 reps;Right;Supine(therapist supporting under elbow, to 30 ONLY) Elbow Flexion: 10 reps;AROM;Supine;Right Wrist Flexion: AROM;10 reps;Right;Supine Wrist Extension: AROM;Right;10 reps;Seated Digit Composite Flexion: AROM;Right;10 reps;Seated Composite Extension: AROM;Right;10 reps;Seated Donning/doffing shirt without moving shoulder: Maximal assistance Method for sponge bathing under operated UE: Caregiver independent with task;Minimal assistance Donning/doffing sling/immobilizer: Maximal assistance(reviewed, continues to require maxA due to pain in R shoulde) Correct positioning of sling/immobilizer: Maximal assistance Pendulum exercises (written home exercise program): (educated to wait for OT due to increased pain ) ROM for elbow, wrist and digits of operated UE: Supervision/safety Sling wearing schedule (on at all times/off for ADL's): Modified independent(able to recall independently) Proper positioning of operated UE when showering: Supervision/safety Positioning of UE while sleeping: Supervision/safety   Assessment/Plan    PT Assessment Patient needs continued PT services  PT Problem List Decreased strength;Decreased range of motion;Decreased activity tolerance;Decreased balance;Decreased mobility;Decreased coordination;Decreased knowledge of use of DME;Decreased knowledge of precautions;Decreased safety  awareness;Decreased cognition;Pain       PT Treatment Interventions DME instruction;Gait training;Stair training;Functional mobility training;Therapeutic activities;Therapeutic exercise;Balance training;Neuromuscular re-education;Patient/family education    PT Goals (Current goals can be found in the Care Plan section)  Acute Rehab PT Goals Patient Stated Goal: to feel better PT Goal Formulation: With patient/family Time For Goal Achievement:  01/21/18 Potential to Achieve Goals: Fair    Frequency Min 2X/week   Barriers to discharge        Co-evaluation               AM-PAC PT "6 Clicks" Daily Activity  Outcome Measure Difficulty turning over in bed (including adjusting bedclothes, sheets and blankets)?: Unable Difficulty moving from lying on back to sitting on the side of the bed? : Unable Difficulty sitting down on and standing up from a chair with arms (e.g., wheelchair, bedside commode, etc,.)?: Unable Help needed moving to and from a bed to chair (including a wheelchair)?: A Lot Help needed walking in hospital room?: A Lot Help needed climbing 3-5 steps with a railing? : Total 6 Click Score: 8    End of Session Equipment Utilized During Treatment: Gait belt;Other (comment)(R UE sling) Activity Tolerance: Patient limited by fatigue Patient left: in bed;with call bell/phone within reach;with family/visitor present Nurse Communication: Mobility status PT Visit Diagnosis: Other abnormalities of gait and mobility (R26.89);Pain Pain - Right/Left: Right Pain - part of body: Shoulder    Time: 1429-1510 PT Time Calculation (min) (ACUTE ONLY): 41 min   Charges:   PT Evaluation $PT Eval Moderate Complexity: 1 Mod PT Treatments $Gait Training: 8-22 mins $Therapeutic Activity: 8-22 mins        Parr[place], [place], DPT [place] 01/07/2018, 4:06 PM

## 2018-01-07 NOTE — Progress Notes (Signed)
Pt agreeable to SNF. Dr Sharol Given on call for Dr. Marlou Sa. Dr Sharol Given made aware. Social work consult put in.

## 2018-01-07 NOTE — Progress Notes (Addendum)
[facility] Progress Note   Dialysis Orders: MWF Ash 3.5 hr EDW 85.5 2 K 2.25 Ca heparin 4000 with 1.5 mid tmt profile 4 var Na 400/600 Mircera 75 q 4 weeks - last 7/17 no Fe or VDRA  Assessment/Plan: 1.  Right shoulder rotator cuff arthropathy/arthritis: Status post right reverse shoulder replacement 8/1 by Dr. Marlou Sa. 2.  End-stage renal disease: IMWF - gets to edw.  Done today on schedule  3.  Hypertension: Blood pressure under fair control at this time, continue to monitor on current therapy. 4.  Anemia of chronic kidney disease: outpt Hgb about 10.5 - 11 - CBC pending for today- last Mircera 7/17 on q 4 week regimen- repeat labs pending 5.  Secondary hyperparathyroidism: We will resume phosphorus binders with meals - not on VDERA 6.  Disp - for d/c today  Myriam Jacobson, PA-C [place] 01/07/2018,8:23 AM  LOS: 1 day   Patient seen and examined, agree with above note with above modifications. Seen on HD- having pain- feeling really badly wants to sign off.  I advised against just because she needs to make it to Monday until her next tx-  For possible discharge today after HD- no issues with HD Corliss Parish, MD 01/07/2018    Subjective:   Would like to be pain free....having shoulder pain for 2 years.  Objective Vitals:   01/06/18 1958 01/07/18 0038 01/07/18 0039 01/07/18 0354  BP: (!) 156/71 (!) 155/46 (!) 155/46 (!) 155/60  Pulse: 81 78 78 79  Resp: 18 16 16 18   Temp:  98.6 F (37 C) 98.6 F (37 C) 99 F (37.2 C)  TempSrc:  Oral Oral Oral  SpO2: 93% 97%  94%  Weight:   85.5 kg (188 lb 7.9 oz)   Height:   4' 10.74" (1.492 m)    Physical Exam General: NAD on HD Heart: RRR 3/6 murmur Lungs: grossly clear anteriory Abdomen: obese soft NT Extremities: no LE edema Dialysis Access: left BC AVF Qb 400   Additional Objective Labs: Basic Metabolic Panel: Recent Labs  Lab 01/04/18 1525 01/06/18 0822  NA 140 139   K 3.8 3.8  CL 102  --   CO2 27  --   GLUCOSE 87 96  BUN 16  --   CREATININE 5.10*  --   CALCIUM 8.0*  --    CBC: Recent Labs  Lab 01/04/18 1525 01/06/18 0822  WBC 4.6  --   HGB 10.6* 12.9  HCT 32.9* 38.0  MCV 115.4*  --   PLT 201  --    Blood Culture    Component Value Date/Time   SDES URINE, CLEAN CATCH 01/04/2018 1524   SPECREQUEST  01/04/2018 1524    NONE Performed at [facility], [place] [address] [place], [place] [address]    CULT MULTIPLE SPECIES PRESENT, SUGGEST RECOLLECTION (A) 01/04/2018 1524   REPTSTATUS 01/05/2018 FINAL 01/04/2018 1524    Cardiac Enzymes: No results for input(s): CKTOTAL, CKMB, CKMBINDEX, TROPONINI in the last 168 hours. CBG: Recent Labs  Lab 01/04/18 1456 01/06/18 0751 01/06/18 1432  GLUCAP 125* 99 126*   Iron Studies: No results for input(s): IRON, TIBC, TRANSFERRIN, FERRITIN in the last 72 hours. Lab Results  Component Value Date   INR 1.06 11/11/2016   INR 1.1 09/07/2008   INR 1.0 12/03/2006   Studies/Results: Dg Shoulder Right Port  Result Date: 01/06/2018 CLINICAL DATA:  Postop EXAM: PORTABLE RIGHT SHOULDER COMPARISON:  Portable exam 1450  hours compared to MR shoulder 06/05/2016 FINDINGS: Components of a reverse RIGHT shoulder arthroplasty are identified. No fracture or dislocation seen on single AP view. Bones appear demineralized. Normal AC joint alignment. IMPRESSION: RIGHT shoulder prosthesis without definite acute abnormalities. Electronically Signed   By: Lavonia Dana M.D.   On: 01/06/2018 15:00   Medications: . methocarbamol (ROBAXIN) IV     . aspirin EC  81 mg Oral Daily  . carvedilol  25 mg Oral QHS  . Chlorhexidine Gluconate Cloth  6 each Topical Q0600  . clonazePAM  0.5 mg Oral QHS  . docusate sodium  100 mg Oral BID  . ezetimibe  10 mg Oral Daily  . feeding supplement (ENSURE ENLIVE)  237 mL Oral BID BM  . fenofibrate  160 mg Oral Daily  . finasteride  2.5 mg Oral Q lunch  . lanthanum  2,000 mg Oral  TID WC  . levothyroxine  50 mcg Oral QAC breakfast  . linagliptin  5 mg Oral Daily  . liraglutide  1.8 mg Subcutaneous Daily  . loperamide  2 mg Oral Q M,W,F-HD  . multivitamin  1 tablet Oral QHS  . nortriptyline  10 mg Oral QHS  . pantoprazole  40 mg Oral Daily  . [START ON 01/09/2018] Vitamin D (Ergocalciferol)  50,000 Units Oral Q30 days  . zinc sulfate  220 mg Oral Q1200

## 2018-01-07 NOTE — Plan of Care (Signed)
  Problem: Activity: Goal: Risk for activity intolerance will decrease Outcome: Adequate for Discharge   Problem: Pain Managment: Goal: General experience of comfort will improve Outcome: Adequate for Discharge   

## 2018-01-07 NOTE — Progress Notes (Signed)
Nutrition Brief Note  Patient identified on the Malnutrition Screening Tool (MST) Report.  Wt Readings from Last 15 Encounters:  01/07/18 194 lb 14.2 oz (88.4 kg)  01/04/18 188 lb 7.9 oz (85.5 kg)  12/14/17 188 lb 11.4 oz (85.6 kg)  10/25/17 186 lb (84.4 kg)  10/15/17 190 lb 4.1 oz (86.3 kg)  10/03/17 187 lb (84.8 kg)  09/20/17 188 lb (85.3 kg)  07/23/17 188 lb (85.3 kg)  06/22/17 203 lb 4.8 oz (92.2 kg)  06/18/17 196 lb 12.8 oz (89.3 kg)  06/16/17 188 lb 15 oz (85.7 kg)  05/28/17 180 lb (81.6 kg)  05/24/17 188 lb (85.3 kg)  02/11/17 191 lb (86.6 kg)  01/12/17 194 lb 7.1 oz (88.2 kg)   Body mass index is 39.71 kg/m. Patient meets criteria for Obesity Class II based on current BMI.   Current diet order is Carbohydrate Modified, patient is consuming approximately 75% of meals at this time. Labs and medications reviewed.   No nutrition interventions warranted at this time. If nutrition issues arise, please consult RD.   Arthur Holms, RD, LDN Pager #: [phone] After-Hours Pager #: [phone]

## 2018-01-07 NOTE — Procedures (Signed)
Patient was seen on dialysis and the procedure was supervised.  BFR 400  Via avf BP is  Currently low.   Patient wanting to sign off with 2 hours remaining- in too much pain - advised against but she is going to anyway   Christean Silvestri A 01/07/2018

## 2018-01-07 NOTE — Clinical Social Work Note (Signed)
PT is now recommending SNF. Pt must stay three midnights in order for Medicare to cover SNF stay.   Loletha Grayer, MSW [phone]

## 2018-01-07 NOTE — Progress Notes (Signed)
PT Cancellation Note  Patient Details Name: Roberta Bryant MRN: [id number] DOB: 06-24-1950   Cancelled Treatment:    Reason Eval/Treat Not Completed: Patient at procedure or test/unavailable. Pt is off of the floor for HD. PT will continue to follow acutely.    Clearnce Sorrel Asahel Risden 01/07/2018, 8:18 AM

## 2018-01-07 NOTE — Progress Notes (Signed)
Occupational Therapy Treatment Patient Details Name: Roberta Bryant MRN: [id number] DOB: 29-Aug-1950 Today's Date: 01/07/2018    History of present illness Pt is a 67 y/o female s/p R reverse shoulder replacement after increased pain with R shoudler rotator cuff arthropathy and arthritis. PMH signifincant for but not limited to: anemia, CHF, HTN, IBS, myocardial infarction, OA, pneumonia, RC arthropathy of R shoulder, CVA, Type 2 DM.    OT comments  Patient supine in bed upon arrival.  Session focused on review of precautions, modified ADL techniques due to precautions with R shoulder, and exercises.  Patient able to recall 25% of exercises taught in morning session, able to recall no active ROM at shoulder and completion of passive ROM with assistance at this time to R shoulder, wearing sling at all times, and NWB through R UE; thoroughly reviewed all precautions and exercises.  Completed exercises listed below, still limited PROM at shoulder FF (increased pain due to patient/son report of son grabbing her shoulder to prevent her from "slipping off" edge of bed prior to session).  Discussed recommendations for SNF rehab to maximize safety and independence prior to dc home, patient agreeable to recommendations. Will continue to follow while admitted.    Follow Up Recommendations  SNF;Supervision/Assistance - 24 hour    Equipment Recommendations  None recommended by OT    Recommendations for Other Services      Precautions / Restrictions Precautions Precautions: Fall;Shoulder Type of Shoulder Precautions: R UE Passive protocol: no AROM shoulder, OK hand, wrist, elbow; passive shoulder FF90, ab 60, ER 60  Shoulder Interventions: Shoulder sling/immobilizer;At all times;Off for dressing/bathing/exercises Precaution Booklet Issued: Yes (comment) Precaution Comments: reviewed precautions with patient and son  Required Braces or Orthoses: Sling Restrictions Weight Bearing Restrictions:  Yes RUE Weight Bearing: Non weight bearing       Mobility Bed Mobility Overal bed mobility: Needs Assistance Bed Mobility: Supine to Sit     Supine to sit: Mod assist;HOB elevated     General bed mobility comments: 2+ assist to scoot up in bed   Transfers Overall transfer level: Needs assistance Equipment used: 1 person hand held assist Transfers: Sit to/from Stand Sit to Stand: Min assist         General transfer comment: min A to ascend, son assisting patient     Balance Overall balance assessment: Needs assistance Sitting-balance support: Single extremity supported;Feet supported Sitting balance-Leahy Scale: Fair     Standing balance support: During functional activity;Single extremity supported Standing balance-Leahy Scale: Fair Standing balance comment: reliant on 1 UE support                           ADL either performed or assessed with clinical judgement   ADL Overall ADL's : Needs assistance/impaired Eating/Feeding: Set up;Sitting   Grooming: Set up;Sitting   Upper Body Bathing: Sitting;Cueing for UE precautions;Moderate assistance Upper Body Bathing Details (indicate cue type and reason): limited ROM of R UE due to precautions  Lower Body Bathing: Sit to/from stand;Moderate assistance Lower Body Bathing Details (indicate cue type and reason): decreased reach to B feet and impaired standing balance Upper Body Dressing : Sitting;Maximal assistance;Cueing for UE precautions Upper Body Dressing Details (indicate cue type and reason): limited ROM of R shoulder due to precautions  Lower Body Dressing: Sit to/from stand;Maximal assistance;Cueing for compensatory techniques Lower Body Dressing Details (indicate cue type and reason): patient with decreased reach and impaired balance  Toilet Transfer: Ambulation;Minimal assistance(simulated to  recliner ) Toilet Transfer Details (indicate cue type and reason): cueing for safety and  techniques Toileting- Clothing Manipulation and Hygiene: Total assistance;Sit to/from stand       Functional mobility during ADLs: Minimal assistance General ADL Comments: session focused on UE exercises and precautions     Vision Patient Visual Report: No change from baseline Vision Assessment?: No apparent visual deficits   Perception     Praxis      Cognition Arousal/Alertness: Awake/alert Behavior During Therapy: WFL for tasks assessed/performed Overall Cognitive Status: Impaired/Different from baseline Area of Impairment: Attention;Memory;Safety/judgement;Awareness;Following commands;Problem solving                   Current Attention Level: Selective Memory: Decreased recall of precautions Following Commands: Follows multi-step commands inconsistently Safety/Judgement: Decreased awareness of safety;Decreased awareness of deficits Awareness: Emergent Problem Solving: Slow processing;Decreased initiation;Difficulty sequencing;Requires verbal cues General Comments: Improving awareness and understanding of deficits, as well as needs after dc.        Exercises Exercises: Shoulder Shoulder Exercises Pendulum Exercise: (able but not completed, patient unable to tolerate) Shoulder Flexion: PROM;Right;10 reps;Supine(5-10 degrees from neutral, pt guarding motion ) Shoulder ABduction: PROM;Self ROM;10 reps;Right;Supine(to 60 ONLY, therapist supporting under elbow) Shoulder External Rotation: PROM;Self ROM;10 reps;Right;Supine(therapist supporting under elbow, to 30 ONLY) Elbow Flexion: 10 reps;AROM;Supine;Right Wrist Flexion: AROM;10 reps;Right;Supine Wrist Extension: AROM;Right;10 reps;Seated Digit Composite Flexion: AROM;Right;10 reps;Seated Composite Extension: AROM;Right;10 reps;Seated   Shoulder Instructions Shoulder Instructions Donning/doffing shirt without moving shoulder: Maximal assistance Method for sponge bathing under operated UE: Caregiver independent  with task;Minimal assistance Donning/doffing sling/immobilizer: Maximal assistance(reviewed, continues to require maxA due to pain in R shoulde) Correct positioning of sling/immobilizer: Maximal assistance Pendulum exercises (written home exercise program): (educated to wait for OT due to increased pain ) ROM for elbow, wrist and digits of operated UE: Supervision/safety Sling wearing schedule (on at all times/off for ADL's): Modified independent(able to recall independently) Proper positioning of operated UE when showering: Supervision/safety Positioning of UE while sleeping: Supervision/safety     General Comments Son present.  Discussed rehab at SNF, patient agreeable with recommendations. Reports increased pain in R shoulder, as patients son grabbed her shoulder to prevent her from "slipping off" edge of bed prior to session.    Pertinent Vitals/ Pain       Pain Assessment: 0-10 Pain Score: 7  Faces Pain Scale: Hurts whole lot Pain Location: R shoulder Pain Descriptors / Indicators: Discomfort;Grimacing;Operative site guarding;Aching Pain Intervention(s): Limited activity within patient's tolerance;Monitored during session;Repositioned;Ice applied;Patient requesting pain meds-RN notified  Home Living Family/patient expects to be discharged to:: Private residence Living Arrangements: Children Available Help at Discharge: Family;Available PRN/intermittently Type of Home: House Home Access: Ramped entrance     Home Layout: One level     Bathroom Shower/Tub: Occupational psychologist: Standard     Home Equipment: Walker - standard;Wheelchair - [employer];Shower seat - built in;Grab bars - tub/shower   Additional Comments: live with 2 sons (diego and sergio), work opposite shifts has assist 75% of the time       Prior Functioning/Environment Level of Independence: Needs assistance  Gait / Transfers Assistance Needed: reports able to complete mobility, but  furniture walks  ADL's / Homemaking Assistance Needed: reports requring assistance with LB dressing, able to complete UB due to wearing hospital gowns, did not complete IADLs    Comments: A with LB dressing, wore hospital gowns, furniture walked    Frequency  Min 2X/week  Progress Toward Goals  OT Goals(current goals can now be found in the care plan section)  Progress towards OT goals: Progressing toward goals  Acute Rehab OT Goals Patient Stated Goal: to feel better OT Goal Formulation: With patient/family Time For Goal Achievement: 01/21/18 Potential to Achieve Goals: Good ADL Goals Pt Will Perform Upper Body Dressing: with mod assist;sitting;with caregiver independent in assisting Pt Will Transfer to Toilet: with supervision;ambulating;regular height toilet;bedside commode Additional ADL Goal #1: Pt and caregiver will verbalize R shoudler precautions and sling wear schedule with independence. Additional ADL Goal #2: Pt will demosntrate ability to complete R UE exercises with minimal verbal cueing.  Plan Discharge plan remains appropriate;Frequency remains appropriate    Co-evaluation                 AM-PAC PT "6 Clicks" Daily Activity     Outcome Measure   Help from another person eating meals?: None Help from another person taking care of personal grooming?: A Little Help from another person toileting, which includes using toliet, bedpan, or urinal?: Total Help from another person bathing (including washing, rinsing, drying)?: A Lot Help from another person to put on and taking off regular upper body clothing?: A Lot Help from another person to put on and taking off regular lower body clothing?: A Lot 6 Click Score: 14    End of Session Equipment Utilized During Treatment: Other (comment)(R sling immobilizer)  OT Visit Diagnosis: Other abnormalities of gait and mobility (R26.89);Muscle weakness (generalized) (M62.81);Pain Pain - Right/Left: Right Pain  - part of body: Shoulder   Activity Tolerance Patient limited by pain   Patient Left in bed;with call bell/phone within reach;with nursing/sitter in room   Nurse Communication Mobility status        Time: 1497-0263 OT Time Calculation (min): 22 min  Charges: OT General Charges $OT Visit: 1 Visit OT Evaluation $OT Eval Moderate Complexity: 1 Mod OT Treatments $Self Care/Home Management : 23-37 mins $Therapeutic Exercise: 8-22 mins  Delight Stare, OTR/L  Pager [phone]    Delight Stare 01/07/2018, 3:52 PM

## 2018-01-07 NOTE — Evaluation (Addendum)
Occupational Therapy Evaluation Patient Details Name: Roberta Bryant MRN: [id number] DOB: 1950-10-01 Today's Date: 01/07/2018    History of Present Illness Pt is a 67 y/o female s/p R reverse shoulder replacement after increased pain with R shoudler rotator cuff arthropathy and arthritis. PMH signifincant for but not limited to: anemia, CHF, HTN, IBS, myocardial infarction, OA, pneumonia, RC arthropathy of R shoulder, CVA, Type 2 DM.    Clinical Impression   PTA patient reports she required assistance with ADLs, IADLs, and furniture walks around her home.  She currently requires max A for UB ADLs, max to total assist for LB ADLs, min assist for toilet transfers.  She has been educated on precautions, safety, mobility, sling wear schedule, sling management, edema reduction and pain management with ice and elevation, and recommendations.  Throughout session, patient and son were hyper-focused on having to "wear the sling at all times" and son voicing "This is going to be fun" and "That will be very difficult", concerning therapist about patients discharge home. Patient presenting with short term memory deficits, questioning therapist about pain and why her shoulder hurt "so bad" before and after surgery. Patient with limited tolerance to PROM to R shoulder, required support under R elbow during SROM exercises; unable to complete without therapist assist at this time.  Pendulums not addressed due to decreased understanding of general exercises provided.  At this time recommend SNF dc, if family disagrees she will need maximal home health services.  Will continue to follow while admitted.     Follow Up Recommendations  SNF;Supervision/Assistance - 24 hour(HHOT vs SNF)    Equipment Recommendations  None recommended by OT    Recommendations for Other Services       Precautions / Restrictions Precautions Precautions: Fall;Shoulder Type of Shoulder Precautions: R UE Passive protocol: no AROM  shoulder, OK hand, wrist, elbow; passive shoulder FF90, ab 60, ER 60  Shoulder Interventions: Shoulder sling/immobilizer;At all times;Off for dressing/bathing/exercises Precaution Booklet Issued: Yes (comment) Precaution Comments: reviewed precautions with patient and son  Required Braces or Orthoses: Sling Restrictions Weight Bearing Restrictions: Yes RUE Weight Bearing: Non weight bearing      Mobility Bed Mobility Overal bed mobility: Needs Assistance Bed Mobility: Supine to Sit     Supine to sit: Mod assist;HOB elevated     General bed mobility comments: requires assistance to ascend trunk maintain balance sitting EOB until feet reach floot  Transfers Overall transfer level: Needs assistance Equipment used: 1 person hand held assist Transfers: Sit to/from Stand Sit to Stand: Min assist         General transfer comment: min A to ascend, son assisting patient     Balance Overall balance assessment: Needs assistance Sitting-balance support: Single extremity supported;Feet supported Sitting balance-Leahy Scale: Fair     Standing balance support: During functional activity;Single extremity supported Standing balance-Leahy Scale: Fair Standing balance comment: reliant on 1 UE support                           ADL either performed or assessed with clinical judgement   ADL Overall ADL's : Needs assistance/impaired Eating/Feeding: Set up;Sitting   Grooming: Set up;Sitting   Upper Body Bathing: Sitting;Cueing for UE precautions;Moderate assistance Upper Body Bathing Details (indicate cue type and reason): limited ROM of R UE due to precautions  Lower Body Bathing: Sit to/from stand;Moderate assistance Lower Body Bathing Details (indicate cue type and reason): decreased reach to B feet and impaired standing  balance Upper Body Dressing : Sitting;Maximal assistance;Cueing for UE precautions Upper Body Dressing Details (indicate cue type and reason): limited  ROM of R shoulder due to precautions  Lower Body Dressing: Sit to/from stand;Maximal assistance;Cueing for compensatory techniques Lower Body Dressing Details (indicate cue type and reason): patient with decreased reach and impaired balance  Toilet Transfer: Ambulation;Minimal assistance(simulated to recliner ) Toilet Transfer Details (indicate cue type and reason): cueing for safety and techniques Toileting- Clothing Manipulation and Hygiene: Total assistance;Sit to/from stand       Functional mobility during ADLs: Minimal assistance General ADL Comments: Patient limited by R UE restrictions and pain, decreased functional reach with L UE as due to generalized weakness and body habitus.      Vision Patient Visual Report: No change from baseline Vision Assessment?: No apparent visual deficits     Perception     Praxis      Pertinent Vitals/Pain Pain Assessment: Faces Faces Pain Scale: Hurts whole lot Pain Location: R shoulder Pain Descriptors / Indicators: Discomfort;Grimacing;Operative site guarding;Aching Pain Intervention(s): Limited activity within patient's tolerance;Monitored during session;Repositioned;Ice applied     Hand Dominance Right   Extremity/Trunk Assessment Upper Extremity Assessment Upper Extremity Assessment: Generalized weakness;RUE deficits/detail RUE Deficits / Details: s/p shoulder surgery, limited per precautions and pain (WFL elbow and distal ) RUE: Unable to fully assess due to immobilization;Unable to fully assess due to pain RUE Sensation: WNL RUE Coordination: decreased fine motor;decreased gross motor   Lower Extremity Assessment Lower Extremity Assessment: Defer to PT evaluation       Communication Communication Communication: HOH   Cognition Arousal/Alertness: Lethargic;Suspect due to medications Behavior During Therapy: [facility] for tasks assessed/performed;Anxious(anxious once completing mobility and R UE ROM) Overall Cognitive Status:  Impaired/Different from baseline Area of Impairment: Problem solving;Awareness;Memory;Attention;Following commands;Safety/judgement                   Current Attention Level: Selective Memory: Decreased short-term memory;Decreased recall of precautions Following Commands: Follows multi-step commands inconsistently Safety/Judgement: Decreased awareness of safety;Decreased awareness of deficits Awareness: Emergent Problem Solving: Slow processing;Decreased initiation;Difficulty sequencing;Requires verbal cues General Comments: Patient seems to be self limiting, asks the same questions multiple times, and requires redirection to tasks.    General Comments  son present throughout session and provided education     Exercises Exercises: Shoulder Shoulder Exercises Pendulum Exercise: (able but not completed, patient unable to tolerate) Shoulder Flexion: PROM;10 reps;Supine;Self ROM;Right(limited range to 5-10 degrees, self limiting and guarding) Shoulder ABduction: PROM;Self ROM;10 reps;Supine;Right(to 60 ONLY, therapist supporting under elbow) Shoulder External Rotation: PROM;Self ROM;10 reps;Supine;Right(SROM with support under elbow, to 30 only ) Elbow Flexion: 10 reps;AROM;Supine;Right Wrist Flexion: AROM;10 reps;Right;Supine Wrist Extension: AROM;Right;10 reps;Seated Digit Composite Flexion: AROM;Right;10 reps;Seated Composite Extension: AROM;Right;10 reps;Seated   Shoulder Instructions Shoulder Instructions Donning/doffing shirt without moving shoulder: Maximal assistance Method for sponge bathing under operated UE: Caregiver independent with task;Minimal assistance Donning/doffing sling/immobilizer: Maximal assistance Correct positioning of sling/immobilizer: Maximal assistance Pendulum exercises (written home exercise program): (educated to wait for OT due to increased pain ) ROM for elbow, wrist and digits of operated UE: Supervision/safety Sling wearing schedule (on at all  times/off for ADL's): Supervision/safety(educated multiple times on sling wear schedule) Proper positioning of operated UE when showering: Supervision/safety Positioning of UE while sleeping: Minimal assistance;Caregiver independent with task    Home Living Family/patient expects to be discharged to:: Private residence Living Arrangements: Children Available Help at Discharge: Family;Available PRN/intermittently Type of Home: House Home Access: Ramped entrance     Home  Layout: One level     Bathroom Shower/Tub: Occupational psychologist: Standard     Home Equipment: Walker - standard;Wheelchair - [employer];Shower seat - built in;Grab bars - tub/shower   Additional Comments: live with 2 sons (diego and sergio), work opposite shifts has assist 75% of the time       Prior Functioning/Environment Level of Independence: Needs assistance  Gait / Transfers Assistance Needed: reports able to complete mobility, but furniture walks  ADL's / Homemaking Assistance Needed: reports requring assistance with LB dressing, able to complete UB due to wearing hospital gowns, did not complete IADLs    Comments: A with LB dressing, wore hospital gowns, furniture walked         OT Problem List: Decreased strength;Decreased range of motion;Decreased activity tolerance;Impaired balance (sitting and/or standing);Decreased safety awareness;Decreased knowledge of use of DME or AE;Decreased knowledge of precautions;Obesity;Pain;Impaired UE functional use      OT Treatment/Interventions: Self-care/ADL training;Therapeutic exercise;Energy conservation;DME and/or AE instruction;Therapeutic activities;Patient/family education;Balance training    OT Goals(Current goals can be found in the care plan section) Acute Rehab OT Goals Patient Stated Goal: home today OT Goal Formulation: With patient/family Time For Goal Achievement: 01/21/18 Potential to Achieve Goals: Good  OT Frequency: Min  2X/week   Barriers to D/C:            Co-evaluation              AM-PAC PT "6 Clicks" Daily Activity     Outcome Measure Help from another person eating meals?: None Help from another person taking care of personal grooming?: A Little Help from another person toileting, which includes using toliet, bedpan, or urinal?: Total Help from another person bathing (including washing, rinsing, drying)?: A Lot Help from another person to put on and taking off regular upper body clothing?: A Lot Help from another person to put on and taking off regular lower body clothing?: A Lot 6 Click Score: 14   End of Session Equipment Utilized During Treatment: Other (comment)(R sling immobilizer) Nurse Communication: Mobility status;Precautions  Activity Tolerance: Patient limited by pain Patient left: in chair;with call bell/phone within reach;with family/visitor present  OT Visit Diagnosis: Other abnormalities of gait and mobility (R26.89);Muscle weakness (generalized) (M62.81);Pain Pain - Right/Left: Right Pain - part of body: Shoulder                Time: 8088-1103 OT Time Calculation (min): 44 min Charges:  OT General Charges $OT Visit: 1 Visit OT Evaluation $OT Eval Moderate Complexity: 1 Mod OT Treatments $Self Care/Home Management : 23-37 mins  Delight Stare, OTR/L  Pager [place] 01/07/2018, 2:06 PM

## 2018-01-07 NOTE — Progress Notes (Signed)
OT Cancellation Note  Patient Details Name: Roberta Bryant MRN: [id number] DOB: August 18, 1950   Cancelled Treatment:    Reason Eval/Treat Not Completed: Patient at procedure or test/ unavailable. Pt off unit at dialysis, will follow up as able.   Anner Crete Sharleen Szczesny 01/07/2018, 8:16 AM

## 2018-01-07 NOTE — Plan of Care (Signed)

## 2018-01-08 LAB — GLUCOSE, CAPILLARY: Glucose-Capillary: 140 mg/dL — ABNORMAL HIGH (ref 70–99)

## 2018-01-08 MED ORDER — CHLORHEXIDINE GLUCONATE CLOTH 2 % EX PADS: 6.0000 | MEDICATED_PAD | Freq: Every day | CUTANEOUS | Status: DC

## 2018-01-08 NOTE — Progress Notes (Signed)
Orthopedic Tech Progress Note Patient Details:  Roberta Bryant 08/10/1950 [id number]  Patient ID: Roberta Bryant, female   DOB: 1950/11/08, 67 y.o.   MRN: [id number] Applied replacement sling immobilizer.  Karolee Stamps 01/08/2018, 5:07 AM

## 2018-01-08 NOTE — Clinical Social Work Note (Signed)
Clinical Social Work Assessment  Patient Details  Name: Roberta Bryant MRN: [id number] Date of Birth: Feb 08, 1951  Date of referral:  01/08/18               Reason for consult:  Facility Placement                Permission sought to share information with:  Chartered certified accountant granted to share information::  Yes, Verbal Permission Granted, Yes, Release of Information Signed  Name::     [place]::  [facility] in [place]  Relationship::  Son  Contact Information:     Housing/Transportation Living arrangements for the past 2 months:  [place] of Information:  Patient, Adult Children Patient Interpreter Needed:  None Criminal Activity/Legal Involvement Pertinent to Current Situation/Hospitalization:  No - Comment as needed Significant Relationships:  Adult Children Lives with:  Adult Children Do you feel safe going back to the place where you live?  No Need for family participation in patient care:  No (Coment)  Care giving concerns:  Pt is alert and oriented.   Social Worker assessment / plan:  CSW spoke with pt and pt's son at bedside. Pt is agreeable to SNF at d/c. Pt lives at home with her two sons. Pt is agreeable to [facility] in [place] and [facility]. CSW to send referral and follow up with bed offers once available.  Employment status:  Retired Forensic scientist:  Medicare PT Recommendations:  [place] / Referral to community resources:  [place]  Patient/Family's Response to care:  Pt verbalized understanding of CSW role and expressed appreciation for support. Pt denies any concern regarding pt care at this time.   Patient/Family's Understanding of and Emotional Response to Diagnosis, Current Treatment, and Prognosis:  Pt understanding and realistic regarding physical limitations. Pt understands the need for SNF placement at d/c. Pt agreeable to SNF placement at  d/c, at this time. Pt's responses emotionally appropriate during conversation with CSW. Pt denies any concern regarding treatment plan at this time. CSW will continue to provide support and facilitate d/c needs.   Emotional Assessment Appearance:  Appears stated age Attitude/Demeanor/Rapport:  (Patient was appropriate) Affect (typically observed):  Accepting, Appropriate, Calm Orientation:  Oriented to Self, Oriented to Place, Oriented to  Time, Oriented to Situation Alcohol / Substance use:  Not Applicable Psych involvement (Current and /or in the community):  No (Comment)  Discharge Needs  Concerns to be addressed:  Care Coordination, Basic Needs Readmission within the last 30 days:  No Current discharge risk:  Dependent with Mobility Barriers to Discharge:  Continued Medical Work up   [employer], LCSW 01/08/2018, 2:45 PM

## 2018-01-08 NOTE — Progress Notes (Signed)
Pt's temp at 04:32 100.5 F, adjusted room temp, encouraged IS, temp rechecked at 06:27 98.2 F. Will continue to monitor.

## 2018-01-08 NOTE — Progress Notes (Signed)
CSW acknowledges consult for prayer/spiritual needs This is out of CSW's scope of practice. Please consult Spiritual Care/[facility] for this matter. At this time there are no further CSW needs. CSW will sign off. If new need arises please reconsult.    Roberta Bryant, MSW, [facility] Clinical Social Worker [phone]

## 2018-01-08 NOTE — Progress Notes (Signed)
Patient ID: Roberta Bryant, female   DOB: [birth year], 67 y.o.   MRN: [id number] Physical therapy has recommended discharge to skilled nursing facility.  Social work has been reconsulted anticipate discharge on Monday.

## 2018-01-08 NOTE — NC FL2 (Signed)
[place] LEVEL OF CARE SCREENING TOOL     IDENTIFICATION  Patient Name: Roberta Bryant Birthdate: 1950-10-01 Sex: female Admission Date (Current Location): 01/06/2018  [facility] and [place] Number:  Herbalist and Address:  [facility], [place] [address] [place], [place] [address]      Provider Number: [id number]  Attending Physician Name and Address:  [name], MD  Relative Name and Phone Number:       Current Level of Care: Hospital Recommended Level of Care: [place] Prior Approval Number:    Date Approved/Denied:   PASRR Number: [id number]  Discharge Plan: SNF    Current Diagnoses: Patient Active Problem List   Diagnosis Date Noted  . Shoulder arthritis 01/06/2018  . Rotator cuff arthropathy of right shoulder   . Status post arthroscopy of right shoulder 06/22/2017  . Spondylosis of cervical spine 06/16/2017  . Aortic stenosis 04/12/2017  . Coronary artery diseaseStatus post PTCA and stenting with drug-eluting stent to left main coronary artery in the summer 2017 04/12/2017  . 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 Headache syndrome 01/08/2017  . Right carpal tunnel syndrome 12/14/2016  . Complete tear of right rotator cuff 10/21/2016  . Gait abnormality 08/28/2016  . Chest pain 11/23/2015  . ESRD on dialysis ([place])   . Type 2 diabetes mellitus with complication ([place])   . Essential hypertension   . Abnormal ECG   . Pain in the chest   . Hypotension 10/30/2014  . Abnormal EKG 10/30/2014  . 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 End stage renal disease ([place]) 07/18/2012  . Other complications due to renal dialysis device, implant, and graft 07/18/2012    Orientation RESPIRATION BLADDER Height & Weight     Time, Situation, Place, Self  Normal Continent Weight: 194 lb 14.2 oz (88.4 kg) Height:  4' 10.74" (149.2 cm)  BEHAVIORAL SYMPTOMS/MOOD NEUROLOGICAL BOWEL NUTRITION  STATUS      Continent Diet(Carb modified, thin liquids)  AMBULATORY STATUS COMMUNICATION OF NEEDS Skin   Limited Assist Verbally Surgical wounds(Closed incision right shoulder, aquacel dressing)                       Personal Care Assistance Level of Assistance  Bathing, Feeding, Dressing Bathing Assistance: Limited assistance Feeding assistance: Independent Dressing Assistance: Limited assistance     Functional Limitations Info  Speech, Hearing, Sight Sight Info: Adequate Hearing Info: Adequate Speech Info: Adequate    SPECIAL CARE FACTORS FREQUENCY  PT (By licensed PT), OT (By licensed OT)     PT Frequency: 2x OT Frequency: 2x            Contractures Contractures Info: Not present    Additional Factors Info  Code Status, Allergies Code Status Info: Full Code Allergies Info: Sulfa Antibiotics, Amoxicillin, Crestor Rosuvastatin Calcium, Ibuprofen, Nal[place] Senior Guaifenesin, Statins, Chlorphen-phenyleph-asa, Gabapentin           Current Medications (01/08/2018):  This is the current hospital active medication list Current Facility-Administered Medications  Medication Dose Route Frequency Provider Last Rate Last Dose  . aspirin EC tablet 81 mg  81 mg Oral Daily [name], MD   81 mg at 01/08/18 0839  . carvedilol (COREG) tablet 25 mg  25 mg Oral QHS [name], MD   25 mg at 01/07/18 2051  . Chlorhexidine Gluconate Cloth 2 % PADS 6 each  6 each Topical Q0600 Elmarie Shiley, MD  6 each at 01/07/18 2351  . Chlorhexidine Gluconate Cloth 2 % PADS 6 each  6 each Topical Q0600 Corliss Parish, MD      . clonazePAM [facility] [place]) tablet 0.5 mg  0.5 mg Oral QHS [name], MD   0.5 mg at 01/07/18 2051  . dimenhyDRINATE (DRAMAMINE) tablet 50 mg  50 mg Oral Q8H PRN [name], MD   50 mg at 01/07/18 1307  . docusate sodium (COLACE) capsule 100 mg  100 mg Oral BID [name], MD   100 mg at 01/08/18 0839  . ezetimibe (ZETIA)  tablet 10 mg  10 mg Oral Daily [name], MD   10 mg at 01/08/18 0839  . feeding supplement (ENSURE ENLIVE) (ENSURE ENLIVE) liquid 237 mL  237 mL Oral BID BM [name], MD      . fenofibrate tablet 160 mg  160 mg Oral Daily [name], MD   160 mg at 01/08/18 0839  . finasteride (PROSCAR) tablet 2.5 mg  2.5 mg Oral Q lunch [name], MD   2.5 mg at 01/08/18 1322  . lanthanum (FOSRENOL) chewable tablet 2,000 mg  2,000 mg Oral TID WC [name], MD   2,000 mg at 01/08/18 1322  . lanthanum (FOSRENOL) chewable tablet 500 mg  500 mg Oral PRN [name], MD      . levothyroxine (SYNTHROID, LEVOTHROID) tablet 50 mcg  50 mcg Oral QAC breakfast [name], MD   50 mcg at 01/08/18 [phone]  . linagliptin (TRADJENTA) tablet 5 mg  5 mg Oral Daily [name], MD   5 mg at 01/08/18 0839  . liraglutide (VICTOZA) SOPN 1.8 mg  1.8 mg Subcutaneous Daily [name], MD      . loperamide (IMODIUM) capsule 2 mg  2 mg Oral Q M,W,F-HD [name], MD      . menthol-cetylpyridinium (CEPACOL) lozenge 3 mg  1 lozenge Oral PRN Marlou Sa Tonna [place], MD       Or  . phenol (CHLORASEPTIC) mouth spray 1 spray  1 spray Mouth/Throat PRN [name], MD      . methocarbamol (ROBAXIN) tablet 500 mg  500 mg Oral Q6H PRN [name], MD   500 mg at 01/08/18 0839   Or  . methocarbamol (ROBAXIN) 500 mg in dextrose 5 % 50 mL IVPB  500 mg Intravenous Q6H PRN [name], MD      . metoCLOPramide (REGLAN) tablet 5-10 mg  5-10 mg Oral Q8H PRN [name], MD       Or  . metoCLOPramide (REGLAN) injection 5-10 mg  5-10 mg Intravenous Q8H PRN [name], MD      . morphine 4 MG/ML injection 3 mg  3 mg Intravenous Q3H PRN [name], MD      . multivitamin (RENA-VIT) tablet 1 tablet  1 tablet Oral QHS [name], MD   1 tablet at 01/07/18 2051  . nortriptyline (PAMELOR) capsule 10 mg  10 mg Oral QHS  [name], MD   10 mg at 01/07/18 2051  . ondansetron (ZOFRAN) tablet 4 mg  4 mg Oral Q6H PRN [name], MD       Or  . ondansetron [facility]) injection 4 mg  4 mg Intravenous Q6H PRN [name], MD      . oxyCODONE (Oxy IR/ROXICODONE) immediate release tablet 5-10 mg  5-10 mg Oral Q4H  PRN [name], MD   10 mg at 01/08/18 0839  . pantoprazole (PROTONIX) EC tablet 40 mg  40 mg Oral Daily [name], MD   40 mg at 01/08/18 [phone]  . [START ON 01/09/2018] Vitamin D (Ergocalciferol) (DRISDOL) capsule 50,000 Units  50,000 Units Oral Q30 days [name], MD      . zinc sulfate capsule 220 mg  220 mg Oral Q1200 [name], MD   220 mg at 01/08/18 1322     Discharge Medications: Please see discharge summary for a list of discharge medications.  Relevant Imaging Results:  Relevant Lab Results:   Additional Information SSN: [id number]  Eileen Stanford, LCSW

## 2018-01-08 NOTE — Progress Notes (Signed)
[facility] Progress Note   Dialysis Orders: MWF Ash 3.5 hr AVF EDW 85.5 2 K 2.25 Ca heparin 4000 with 1.5 mid tmt profile 4 var Na 400/600 Mircera 75 q 4 weeks - last 7/17 no Fe or VDRA  Assessment/Plan: 1.  Right shoulder rotator cuff arthropathy/arthritis: Status post right reverse shoulder replacement 8/1 by Dr. Marlou Sa. Difficulty with pain control and concern for safety- now SNF being recommended 2.  End-stage renal disease: MWF via AVF - gets to edw.  Done yesterday on schedule- will next be due on Monday  3.  Hypertension: Blood pressure under fair control at this time, continue to monitor on current therapy. 4.  Anemia of chronic kidney disease: outpt Hgb about 10.5 - 11 - stable so far here- last Mircera 7/17 on q 4 week regimen- repeat labs pending 5.  Secondary hyperparathyroidism: We will resume Fosrenol with meals - not on active vit D but on nutritional 6.  Disp - now decided needs SNF- staying until that can be arranged   Renal will not round on pt tomorrow- orders for HD written for Monday- call [phone] over weekend with any concerns  Corliss Parish, MD 01/08/2018    Subjective:   Accepting of decision for short term SNF- asking for a lot of small things in the room- feels helpless  Objective Vitals:   01/07/18 1350 01/07/18 1950 01/08/18 0432 01/08/18 0627  BP: (!) 159/34 (!) 156/51 (!) 141/52   Pulse: 82 98 80   Resp:  18 18   Temp: 98.2 F (36.8 C) 99.4 F (37.4 C) (!) 100.5 F (38.1 C) 98.2 F (36.8 C)  TempSrc: Oral Oral Axillary Oral  SpO2: 99% 95% 95%   Weight:      Height:       Physical Exam General: very needy - c/o muscular pain in abdomen Heart: RRR 3/6 murmur Lungs: grossly clear anteriory Abdomen: obese soft NT Extremities: no LE edema Dialysis Access: left BC AVF Qb 400   Additional Objective Labs: Basic Metabolic Panel: Recent Labs  Lab 01/04/18 1525 01/06/18 0822  NA 140 139  K 3.8 3.8  CL 102  --   CO2 27  --    GLUCOSE 87 96  BUN 16  --   CREATININE 5.10*  --   CALCIUM 8.0*  --    CBC: Recent Labs  Lab 01/04/18 1525 01/06/18 0822  WBC 4.6  --   HGB 10.6* 12.9  HCT 32.9* 38.0  MCV 115.4*  --   PLT 201  --    Blood Culture    Component Value Date/Time   SDES URINE, CLEAN CATCH 01/04/2018 1524   SPECREQUEST  01/04/2018 1524    NONE Performed at [facility], [address] [place], [place] [address]    CULT MULTIPLE SPECIES PRESENT, SUGGEST RECOLLECTION (A) 01/04/2018 1524   REPTSTATUS 01/05/2018 FINAL 01/04/2018 1524    Cardiac Enzymes: No results for input(s): CKTOTAL, CKMB, CKMBINDEX, TROPONINI in the last 168 hours. CBG: Recent Labs  Lab 01/04/18 1456 01/06/18 0751 01/06/18 1432 01/08/18 0759  GLUCAP 125* 99 126* 140*   Iron Studies: No results for input(s): IRON, TIBC, TRANSFERRIN, FERRITIN in the last 72 hours. Lab Results  Component Value Date   INR 1.06 11/11/2016   INR 1.1 09/07/2008   INR 1.0 12/03/2006   Studies/Results: Dg Shoulder Right Port  Result Date: 01/06/2018 CLINICAL DATA:  Postop EXAM: PORTABLE RIGHT SHOULDER COMPARISON:  Portable exam 1450 hours compared to  MR shoulder 06/05/2016 FINDINGS: Components of a reverse RIGHT shoulder arthroplasty are identified. No fracture or dislocation seen on single AP view. Bones appear demineralized. Normal AC joint alignment. IMPRESSION: RIGHT shoulder prosthesis without definite acute abnormalities. Electronically Signed   By: Lavonia Dana M.D.   On: 01/06/2018 15:00   Medications: . methocarbamol (ROBAXIN) IV     . aspirin EC  81 mg Oral Daily  . carvedilol  25 mg Oral QHS  . Chlorhexidine Gluconate Cloth  6 each Topical Q0600  . clonazePAM  0.5 mg Oral QHS  . docusate sodium  100 mg Oral BID  . ezetimibe  10 mg Oral Daily  . feeding supplement (ENSURE ENLIVE)  237 mL Oral BID BM  . fenofibrate  160 mg Oral Daily  . finasteride  2.5 mg Oral Q lunch  . lanthanum  2,000 mg Oral TID WC  .  levothyroxine  50 mcg Oral QAC breakfast  . linagliptin  5 mg Oral Daily  . liraglutide  1.8 mg Subcutaneous Daily  . loperamide  2 mg Oral Q M,W,F-HD  . multivitamin  1 tablet Oral QHS  . nortriptyline  10 mg Oral QHS  . pantoprazole  40 mg Oral Daily  . [START ON 01/09/2018] Vitamin D (Ergocalciferol)  50,000 Units Oral Q30 days  . zinc sulfate  220 mg Oral Q1200

## 2018-01-08 NOTE — Plan of Care (Signed)
  Problem: Activity: Goal: Risk for activity intolerance will decrease Outcome: Progressing   Problem: Nutrition: Goal: Adequate nutrition will be maintained Outcome: Progressing   Problem: Pain Managment: Goal: General experience of comfort will improve Outcome: Progressing   Problem: Safety: Goal: Ability to remain free from injury will improve Outcome: Progressing   

## 2018-01-09 NOTE — Progress Notes (Signed)
RN received phone call at 12:40 stated that pt had fallen. RN went to room and found pt sitting on floor in front of chair. Pt's son stated that he "was helping her from the bed to the chair when she started to slide down to the floor." Pt states that she did not hit shoulder or head. That she sat on her bottom. Pt attributes fall to being weak in her legs. Pt's son stated that he "timed staff" and "it took 46 seconds" for staff to arrive. Before fall NT had helped pt use the bedpan and helped her sit on the side of the bed to eat her lunch. Call bell was within reach. Pt stated she " did not want to be a bother." Staff assisted pt back to bed. MD informed. Family at bedside. Reeducated pt and family on importance of staff assisting pt when getting up or out of bed, they confirmed understanding.

## 2018-01-09 NOTE — Progress Notes (Signed)
Patient ID: Roberta Bryant, female   DOB: 1951/02/07, 67 y.o.   MRN: [id number] Patient is resting comfortably this morning she has no complaints.  Plan for discharge to skilled nursing facility on Monday.  Prescriptions are on the chart.

## 2018-01-09 NOTE — Progress Notes (Signed)
Occupational Therapy Treatment Patient Details Name: Roberta Bryant MRN: [id number] DOB: Sep 05, 1950 Today's Date: 01/09/2018    History of present illness Pt is a 67 y/o female s/p R reverse shoulder replacement after increased pain with R shoudler rotator cuff arthropathy and arthritis. PMH signifincant for but not limited to: anemia, CHF, HTN, IBS, myocardial infarction, OA, pneumonia, RC arthropathy of R shoulder, CVA, Type 2 DM.    OT comments  Pt continues to present with decreased strength, balance, and activity tolerance. Pt requiring Min A for stand pivot to recliner. Pt requiring Max cues to recall RUE exercises and Min A to support elbow. Reviewed UB ADLs and pt able to verbalize compensatory techniques. Continue to recommend dc to SNF and will continue to follow acutely as admitted.    Follow Up Recommendations  SNF;Supervision/Assistance - 24 hour    Equipment Recommendations  None recommended by OT    Recommendations for Other Services      Precautions / Restrictions Precautions Precautions: Fall;Shoulder Type of Shoulder Precautions: R UE Passive protocol: no AROM shoulder, OK hand, wrist, elbow; passive shoulder FF90, ab 60, ER 60  Shoulder Interventions: Shoulder sling/immobilizer;At all times;Off for dressing/bathing/exercises Precaution Booklet Issued: Yes (comment) Precaution Comments: reviewed precautions with patient and son  Required Braces or Orthoses: Sling Restrictions Weight Bearing Restrictions: Yes RUE Weight Bearing: Non weight bearing       Mobility Bed Mobility Overal bed mobility: Needs Assistance Bed Mobility: Supine to Sit     Supine to sit: Mod assist;HOB elevated     General bed mobility comments: Mod A for elevating trunk and bringing hips towards EOB with bed pad. Pt able to perform bridge position to bring hips closer to EOB.  Transfers Overall transfer level: Needs assistance Equipment used: 1 person hand held assist Transfers:  Sit to/from Stand Sit to Stand: Min assist         General transfer comment: Min A to power up into standing and then gain standing balance    Balance Overall balance assessment: Needs assistance Sitting-balance support: Feet supported Sitting balance-Leahy Scale: Fair     Standing balance support: During functional activity;Single extremity supported Standing balance-Leahy Scale: Poor Standing balance comment: reliant on 1 UE support                           ADL either performed or assessed with clinical judgement   ADL Overall ADL's : Needs assistance/impaired         Upper Body Bathing: Moderate assistance;Sitting Upper Body Bathing Details (indicate cue type and reason): Reviewed UB bathing techniques. Pt verbalized understanding     Upper Body Dressing : Maximal assistance;Sitting;Cueing for UE precautions Upper Body Dressing Details (indicate cue type and reason): Reviewed UB dressing techniques and pt verbalizing understanding. Pt will require further practice and therapy to increase safety with UB dressing. Pt requiring Max A for managing sling. Reviewing sling wear and positioning.      Toilet Transfer: Minimal assistance;Stand-pivot(simulated to recliner ) Armed forces technical officer Details (indicate cue type and reason): Stand pivot with Min A for safety.         Functional mobility during ADLs: Minimal assistance(stand pivot) General ADL Comments: Pt performing RUE exercises with Max cues for recall and Min A for support at elbow. Reviewing UB ADL techniques and Sling management     Vision   Vision Assessment?: No apparent visual deficits   Perception     Praxis  Cognition Arousal/Alertness: Awake/alert Behavior During Therapy: WFL for tasks assessed/performed Overall Cognitive Status: Impaired/Different from baseline Area of Impairment: Attention;Memory;Safety/judgement;Awareness;Following commands;Problem solving                    Current Attention Level: Selective Memory: Decreased recall of precautions Following Commands: Follows multi-step commands inconsistently Safety/Judgement: Decreased awareness of safety;Decreased awareness of deficits Awareness: Emergent Problem Solving: Slow processing;Decreased initiation;Difficulty sequencing;Requires verbal cues General Comments: Improving awareness and understanding of deficits, as well as needs after dc. Continues to require increased time and cues.        Exercises Exercises: Shoulder Shoulder Exercises Pendulum Exercise: (not completed, patient unable to tolerate) Shoulder ABduction: PROM;Self ROM;10 reps;Right;Seated(to 60 ONLY, therapist supporting under elbow) Shoulder External Rotation: PROM;Self ROM;10 reps;Right;Seated(therapist supporting under elbow, to 30 ONLY) Elbow Flexion: 10 reps;AROM;Right;Seated Elbow Extension: AROM;Right;10 reps;Seated Wrist Flexion: AROM;10 reps;Right;Seated Wrist Extension: AROM;Right;10 reps;Seated Digit Composite Flexion: AROM;Right;10 reps;Seated Composite Extension: AROM;Right;10 reps;Seated Neck Flexion: AROM;10 reps;Seated Neck Extension: AROM;10 reps;Seated Neck Lateral Flexion - Right: AROM;10 reps;Seated Neck Lateral Flexion - Left: AROM;10 reps;Seated   Shoulder Instructions Shoulder Instructions Donning/doffing shirt without moving shoulder: Maximal assistance Method for sponge bathing under operated UE: Caregiver independent with task;Minimal assistance Donning/doffing sling/immobilizer: Maximal assistance(reviewed, continues to require maxA due to pain in R shoulde) Correct positioning of sling/immobilizer: Maximal assistance Pendulum exercises (written home exercise program): (educated to wait for OT due to increased pain ) ROM for elbow, wrist and digits of operated UE: Supervision/safety Sling wearing schedule (on at all times/off for ADL's): Modified independent(able to recall independently) Proper  positioning of operated UE when showering: Supervision/safety Positioning of UE while sleeping: Supervision/safety     General Comments      Pertinent Vitals/ Pain       Pain Assessment: Faces Faces Pain Scale: Hurts even more Pain Location: R shoulder Pain Descriptors / Indicators: Discomfort;Grimacing;Operative site guarding;Aching Pain Intervention(s): Monitored during session;Limited activity within patient's tolerance;Repositioned;Ice applied  Home Living                                          Prior Functioning/Environment              Frequency  Min 2X/week        Progress Toward Goals  OT Goals(current goals can now be found in the care plan section)  Progress towards OT goals: Progressing toward goals  Acute Rehab OT Goals Patient Stated Goal: to feel better OT Goal Formulation: With patient/family Time For Goal Achievement: 01/21/18 Potential to Achieve Goals: Good ADL Goals Pt Will Perform Upper Body Dressing: with mod assist;sitting;with caregiver independent in assisting Pt Will Transfer to Toilet: with supervision;ambulating;regular height toilet;bedside commode Additional ADL Goal #1: Pt and caregiver will verbalize R shoudler precautions and sling wear schedule with independence. Additional ADL Goal #2: Pt will demosntrate ability to complete R UE exercises with minimal verbal cueing.  Plan Discharge plan remains appropriate;Frequency remains appropriate    Co-evaluation                 AM-PAC PT "6 Clicks" Daily Activity     Outcome Measure   Help from another person eating meals?: None Help from another person taking care of personal grooming?: A Little Help from another person toileting, which includes using toliet, bedpan, or urinal?: Total Help from another person bathing (including washing, rinsing, drying)?: A Lot Help  from another person to put on and taking off regular upper body clothing?: A Lot Help from  another person to put on and taking off regular lower body clothing?: A Lot 6 Click Score: 14    End of Session Equipment Utilized During Treatment: Gait belt;Other (comment)(R sling immobilizer)  OT Visit Diagnosis: Other abnormalities of gait and mobility (R26.89);Muscle weakness (generalized) (M62.81);Pain Pain - Right/Left: Right Pain - part of body: Shoulder   Activity Tolerance Patient tolerated treatment well   Patient Left with call bell/phone within reach;in chair   Nurse Communication Mobility status;Other (comment)(Pt up in recliner)        Time: 4481-8563 OT Time Calculation (min): 31 min  Charges: OT General Charges $OT Visit: 1 Visit OT Treatments $Self Care/Home Management : 8-22 mins $Therapeutic Activity: 8-22 mins  Camerin Ladouceur MSOT, OTR/L Acute Rehab Pager: [phone] Office: [place] 01/09/2018, 5:15 PM

## 2018-01-10 LAB — RENAL FUNCTION PANEL
ALBUMIN: 2.4 g/dL — AB (ref 3.5–5.0)
Anion gap: 12 (ref 5–15)
BUN: 32 mg/dL — AB (ref 8–23)
CHLORIDE: 94 mmol/L — AB (ref 98–111)
CO2: 26 mmol/L (ref 22–32)
CREATININE: 6.96 mg/dL — AB (ref 0.44–1.00)
Calcium: 7.3 mg/dL — ABNORMAL LOW (ref 8.9–10.3)
GFR calc Af Amer: 6 mL/min — ABNORMAL LOW (ref >60)
GFR, EST NON AFRICAN AMERICAN: 5 mL/min — AB (ref >60)
GLUCOSE: 116 mg/dL — AB (ref 70–99)
POTASSIUM: 3.6 mmol/L (ref 3.5–5.1)
Phosphorus: 4.3 mg/dL (ref 2.5–4.6)
Sodium: 132 mmol/L — ABNORMAL LOW (ref 135–145)

## 2018-01-10 LAB — CBC
HCT: 25.5 % — ABNORMAL LOW (ref 36.0–46.0)
Hemoglobin: 8.5 g/dL — ABNORMAL LOW (ref 12.0–15.0)
MCH: 37.6 pg — ABNORMAL HIGH (ref 26.0–34.0)
MCHC: 33.3 g/dL (ref 30.0–36.0)
MCV: 112.8 fL — ABNORMAL HIGH (ref 78.0–100.0)
PLATELETS: 175 10*3/uL (ref 150–400)
RBC: 2.26 MIL/uL — ABNORMAL LOW (ref 3.87–5.11)
RDW: 13.1 % (ref 11.5–15.5)
WBC: 5.2 10*3/uL (ref 4.0–10.5)

## 2018-01-10 LAB — GLUCOSE, CAPILLARY
GLUCOSE-CAPILLARY: 119 mg/dL — AB (ref 70–99)
Glucose-Capillary: 117 mg/dL — ABNORMAL HIGH (ref 70–99)

## 2018-01-10 NOTE — Progress Notes (Signed)
Occupational Therapy Treatment Patient Details Name: Roberta Bryant MRN: [id number] DOB: 1951-04-28 Today's Date: 01/10/2018    History of present illness Pt is a 68 y/o female s/p R reverse shoulder replacement after increased pain with R shoudler rotator cuff arthropathy and arthritis. PMH signifincant for but not limited to: anemia, CHF, HTN, IBS, myocardial infarction, OA, pneumonia, RC arthropathy of R shoulder, CVA, Type 2 DM.    OT comments  This 67 yo female admitted for above seen today for RUE exercises while supine in bed for dialysis. Only tolerating minimal PROM of shoulder. She will benefit from continued acute OT with follow up OT at SNF.   Follow Up Recommendations  SNF;Supervision/Assistance - 24 hour    Equipment Recommendations  None recommended by OT       Precautions / Restrictions Precautions Precautions: Fall;Shoulder Type of Shoulder Precautions: R UE Passive protocol: no AROM shoulder, OK hand, wrist, elbow; passive shoulder FF90, ab 60, ER 30  Shoulder Interventions: Shoulder sling/immobilizer;At all times;Off for dressing/bathing/exercises Required Braces or Orthoses: Sling Restrictions Weight Bearing Restrictions: Yes RUE Weight Bearing: Non weight bearing                              Cognition Arousal/Alertness: Lethargic(said she did not sleep well last night)   Overall Cognitive Status: Impaired/Different from baseline Area of Impairment: Problem solving                             Problem Solving: Slow processing;Decreased initiation;Difficulty sequencing;Requires verbal cues          Exercises Other Exercises Other Exercises: Pt seen for RUE exercises today supine in bed during dialysis. Pt completed 20 reps (2x10) of PROM for shoulder flexion (only tolerating ~10-20 degrees), shoulder abduction (only tolerating ~20 degrees), and shoulder external rotation (only tolerating ~20 degrees from neutral); elbow  flexion/extension, forearm supination/pronation, wrist flexion/extension, and composite finger flexion/extension.           Pertinent Vitals/ Pain       Pain Assessment: Faces Faces Pain Scale: Hurts whole lot Pain Location: R shoulder with movement Pain Descriptors / Indicators: Grimacing;Guarding;Moaning;Sore Pain Intervention(s): Monitored during session;Repositioned     Prior Functioning/Environment              Frequency  (daily for shoulder)        Progress Toward Goals  OT Goals(current goals can now be found in the care plan section)  Progress towards OT goals: Not progressing toward goals - comment(Needs more than minimal cuing, (may be due to lethargy today))     Plan Discharge plan remains appropriate;Frequency remains appropriate       AM-PAC PT "6 Clicks" Daily Activity     Outcome Measure   Help from another person eating meals?: None Help from another person taking care of personal grooming?: A Little Help from another person toileting, which includes using toliet, bedpan, or urinal?: Total Help from another person bathing (including washing, rinsing, drying)?: A Lot Help from another person to put on and taking off regular upper body clothing?: A Lot Help from another person to put on and taking off regular lower body clothing?: Total 6 Click Score: 13    End of Session Equipment Utilized During Treatment: (repositioned sling immoblizer post exercises)  OT Visit Diagnosis: Other abnormalities of gait and mobility (R26.89);Muscle weakness (generalized) (M62.81);Pain Pain - Right/Left: Right Pain -  part of body: Shoulder   Activity Tolerance (pt tolerated exercises, but only limited PROM)   Patient Left in bed;with call bell/phone within reach   Nurse Communication          Time: 4680-3212 OT Time Calculation (min): 29 min  Charges: OT General Charges $OT Visit: 1 Visit OT Treatments $Therapeutic Exercise: 23-37 mins  [address] [address],  OTR/L [phone] 01/10/2018

## 2018-01-10 NOTE — Plan of Care (Signed)
  Problem: Activity: Goal: Risk for activity intolerance will decrease Outcome: Progressing   Problem: Nutrition: Goal: Adequate nutrition will be maintained Outcome: Progressing   Problem: Coping: Goal: Level of anxiety will decrease Outcome: Progressing   Problem: Pain Managment: Goal: General experience of comfort will improve Outcome: Progressing   Problem: Skin Integrity: Goal: Risk for impaired skin integrity will decrease Outcome: Progressing   

## 2018-01-10 NOTE — Progress Notes (Signed)
Discharge instructions completed with pt.  Pt verbalized understanding of the information.  Pt denies chest pain, shortness of breath, dizziness, lightheadedness, and n/v.  Pt's IV d/c'ed.  [facility] to transport pt to facility at 3 pm.

## 2018-01-10 NOTE — Progress Notes (Signed)
Chaplain met with the PT and prayed with her as she requested prayer.  She is being transported to a nursing home and asked to be put on the church prayer list.

## 2018-01-10 NOTE — Progress Notes (Signed)
Patient ID: Roberta Bryant, female   DOB: 1951-03-18, 67 y.o.   MRN: [id number] Patient has no complaints this morning.  Plan for discharge to skilled nursing.  Prescriptions on the chart and paperwork completed for discharge.  Patient slid to the floor yesterday when her son was assisting her from the bed to a chair.  Patient had no injury to her shoulder sustained no trauma.

## 2018-01-10 NOTE — Discharge Summary (Signed)
Discharge Diagnoses:  Active Problems:   Shoulder arthritis   Rotator cuff arthropathy of right shoulder   Surgeries: Procedure(s): RIGHT REVERSE SHOULDER ARTHROPLASTY on 01/06/2018    Consultants: Treatment Team:  Elmarie Shiley, MD  Discharged Condition: Improved  Hospital Course: Roberta Bryant is an 67 y.o. female who was admitted 01/06/2018 with a chief complaint of right shoulder pain, with a final diagnosis of RIGHT SHOULDER [place].  Patient was brought to the operating room on 01/06/2018 and underwent Procedure(s): RIGHT REVERSE SHOULDER ARTHROPLASTY.    Patient was given perioperative antibiotics:  Anti-infectives (From admission, onward)   Start     Dose/Rate Route Frequency Ordered Stop   01/06/18 1930  vancomycin (VANCOCIN) IVPB 1000 mg/200 mL premix     1,000 mg 200 mL/hr over 60 Minutes Intravenous Every 12 hours 01/06/18 1632 01/06/18 2113   01/06/18 1306  vancomycin (VANCOCIN) powder  Status:  Discontinued       As needed 01/06/18 1306 01/06/18 1419   01/06/18 0745  clindamycin (CLEOCIN) IVPB 900 mg     900 mg 100 mL/hr over 30 Minutes Intravenous On call to O.R. 01/06/18 1751 01/06/18 1120    .  Patient was given sequential compression devices, early ambulation, and aspirin for DVT prophylaxis.  Recent vital signs:  Patient Vitals for the past 24 hrs:  BP Temp Temp src Pulse Resp SpO2  01/10/18 0448 (!) 145/52 98 F (36.7 C) Oral 69 14 100 %  01/09/18 2023 (!) 159/69 98.7 F (37.1 C) Oral 77 15 98 %  01/09/18 1325 (!) 137/52 98.9 F (37.2 C) Oral 73 - 96 %  .  Recent laboratory studies: No results found.  Discharge Medications:   Allergies as of 01/10/2018      Reactions   Sulfa Antibiotics Anaphylaxis   Amoxicillin Hives   Has patient had a PCN reaction causing immediate rash, facial/tongue/throat swelling, SOB or lightheadedness with hypotension: No Has patient had a PCN reaction causing severe rash involving mucus membranes or skin  necrosis: No Has patient had a PCN reaction that required hospitalization: No Has patient had a PCN reaction occurring within the last 10 years: Unknown If all of the above answers are "NO", then may proceed with Cephalosporin use.   Crestor [rosuvastatin Calcium] Other (See Comments)   Dizziness, couldn't walk   Ibuprofen Hives   Naldecon Senior [guaifenesin] Hives   Statins Hives   Chlorphen-phenyleph-asa Rash   Gabapentin Other (See Comments)   Dizzy      Medication List    STOP taking these medications   ciprofloxacin 500 MG tablet Commonly known as:  CIPRO   HYDROcodone-acetaminophen 5-325 MG tablet Commonly known as:  NORCO/VICODIN     TAKE these medications   acetaminophen 500 MG tablet Commonly known as:  TYLENOL Take 500-1,000 mg by mouth every 6 (six) hours as needed (FOR PAIN.).   aspirin EC 81 MG tablet Take 81 mg by mouth daily.   carvedilol 12.5 MG tablet Commonly known as:  COREG Take 25 mg by mouth at bedtime.   clonazePAM 0.5 MG tablet Commonly known as:  KLONOPIN TAKE 1 TABLET BY MOUTH AT BEDTIME   DIALYVITE 800 WITH ZINC 0.8 MG Tabs Take 1 tablet by mouth daily.   docusate sodium 100 MG capsule Commonly known as:  COLACE Take 100 mg by mouth 2 (two) times daily.   DRAMAMINE 50 MG tablet Generic drug:  dimenhyDRINATE Take 50 mg by mouth every 8 (eight) hours as needed (  for dizziness/vertigo).   ezetimibe 10 MG tablet Commonly known as:  ZETIA Take 10 mg by mouth daily.   fenofibrate 160 MG tablet Take 160 mg by mouth daily.   finasteride 5 MG tablet Commonly known as:  PROSCAR Take 2.5 mg by mouth daily with lunch.   lanthanum 1000 MG chewable tablet Commonly known as:  FOSRENOL Chew 2,000 mg by mouth See admin instructions. TAKE 2 TABLETS (2000 MG) BY MOUTH DAILY AFTER EACH MEALS & 1 TABLET (1000 MG) BY MOUTH WITH SNACKS.   levothyroxine 50 MCG tablet Commonly known as:  SYNTHROID, LEVOTHROID Take 50 mcg by mouth daily before  breakfast.   lidocaine-prilocaine cream Commonly known as:  EMLA Apply 1 application topically See admin instructions. Apply a small amount to access site 1-2 hours before dialysis--cover with occlusive dressing   loperamide 2 MG tablet Commonly known as:  IMODIUM A-D Take 2 mg by mouth every Monday, Wednesday, and Friday with hemodialysis.   nortriptyline 10 MG capsule Commonly known as:  PAMELOR Take one capsule at night for one week, then take 2 capsules at night What changed:    how much to take  how to take this  when to take this  additional instructions   omeprazole 20 MG capsule Commonly known as:  PRILOSEC Take 40 mg by mouth 2 (two) times daily before a meal.   sitaGLIPtin 25 MG tablet Commonly known as:  JANUVIA Take 25 mg by mouth daily with lunch.   Turmeric Curcumin 500 MG Caps Take 500 mg by mouth daily with lunch.   VICTOZA 18 MG/3ML Sopn Generic drug:  liraglutide Inject 1.8 mg into the skin daily.   Vitamin D (Ergocalciferol) 50000 units Caps capsule Commonly known as:  DRISDOL Take 50,000 Units by mouth every 30 (thirty) days.   Zinc 50 MG Caps Take 50 mg by mouth daily with lunch.       Diagnostic Studies: Dg Shoulder Right Port  Result Date: 01/06/2018 CLINICAL DATA:  Postop EXAM: PORTABLE RIGHT SHOULDER COMPARISON:  Portable exam 1450 hours compared to MR shoulder 06/05/2016 FINDINGS: Components of a reverse RIGHT shoulder arthroplasty are identified. No fracture or dislocation seen on single AP view. Bones appear demineralized. Normal AC joint alignment. IMPRESSION: RIGHT shoulder prosthesis without definite acute abnormalities. Electronically Signed   By: Lavonia Dana M.D.   On: 01/06/2018 15:00    Patient benefited maximally from their hospital stay and there were no complications.     Disposition: Discharge disposition: 03-Skilled Nursing Facility      Discharge Instructions    Call MD / Call 911   Complete by:  As directed     If you experience chest pain or shortness of breath, CALL 911 and be transported to the hospital emergency room.  If you develope a fever above 101 F, pus (white drainage) or increased drainage or redness at the wound, or calf pain, call your surgeon's office.   Call MD / Call 911   Complete by:  As directed    If you experience chest pain or shortness of breath, CALL 911 and be transported to the hospital emergency room.  If you develope a fever above 101 F, pus (white drainage) or increased drainage or redness at the wound, or calf pain, call your surgeon's office.   Constipation Prevention   Complete by:  As directed    Drink plenty of fluids.  Prune juice may be helpful.  You may use a stool softener, such as Colace (  over the counter) 100 mg twice a day.  Use MiraLax (over the counter) for constipation as needed.   Constipation Prevention   Complete by:  As directed    Drink plenty of fluids.  Prune juice may be helpful.  You may use a stool softener, such as Colace (over the counter) 100 mg twice a day.  Use MiraLax (over the counter) for constipation as needed.   Diet - low sodium heart healthy   Complete by:  As directed    Diet - low sodium heart healthy   Complete by:  As directed    Discharge instructions   Complete by:  As directed    No lifting with right arm Use CPM machine 1 hour 3 times a day Okay to shower dressing is waterproof Otherwise stay in sling for comfort.   Increase activity slowly as tolerated   Complete by:  As directed    Increase activity slowly as tolerated   Complete by:  As directed      Follow-up Information    Marlou Sa Tonna Corner, MD Follow up in 1 week(s).   Specialty:  Orthopedic Surgery Contact information: [place] [address] [phone]            Signed: Newt Minion 01/10/2018, 6:59 AM

## 2018-01-10 NOTE — Clinical Social Work Placement (Signed)
   CLINICAL SOCIAL WORK PLACEMENT  NOTE  Date:  01/10/2018  Patient Details  Name: Roberta Bryant MRN: [id number] Date of Birth: [birth year]  Clinical Social Work is seeking post-discharge placement for this patient at the [place] level of care (*CSW will initial, date and re-position this form in  chart as items are completed):      Patient/family provided with [facility] Work Department's list of facilities offering this level of care within the geographic area requested by the patient (or if unable, by the patient's family).  Yes   Patient/family informed of their freedom to choose among providers that offer the needed level of care, that participate in Medicare, Medicaid or managed care program needed by the patient, have an available bed and are willing to accept the patient.      Patient/family informed of [facility]'s ownership interest in [facility] and [facility], as well as of the fact that they are under no obligation to receive care at these facilities.  PASRR submitted to EDS on       PASRR number received on 01/08/18     Existing PASRR number confirmed on       FL2 transmitted to all facilities in geographic area requested by pt/family on 01/08/18     FL2 transmitted to all facilities within larger geographic area on       Patient informed that his/her managed care company has contracts with or will negotiate with certain facilities, including the following:        Yes   Patient/family informed of bed offers received.  Patient chooses bed at [facility] and [facility] recommends and patient chooses bed at      Patient to be transferred to [facility] on 01/10/18.  Patient to be transferred to facility by PTAR     Patient family notified on 01/10/18 of transfer.  Name of family member notified:  [name]     PHYSICIAN       Additional Comment:     _______________________________________________ Eileen Stanford, LCSW 01/10/2018, 1:32 PM

## 2018-01-10 NOTE — Progress Notes (Signed)
[facility] Progress Note   Dialysis Orders: MWF [facility]  3.5h  85.5kg  2/2.25   Hep 4000  LUA AVF - Mircera 75 q 4 weeks - last 7/17  - no Fe or VDRA  Assessment/Plan: 1.  Right shoulder rotator cuff arthropathy/arthritis: Status post right reverse shoulder replacement 8/1 by Dr. Marlou Sa. Difficulty with pain control and concern for safety- now SNF being recommended 2.  End-stage renal disease: MWF via AVF - gets to [facility].  HD today.  3.  Hypertension: BP's stable, on coreg 25 hs 4.  Anemia of chronic kidney disease: outpt Hgb about 10.5 - 11 - stable so far here- last Mircera 7/17 on q 4 week regimen- repeat labs pending 5.  Secondary hyperparathyroidism: resumed Fosrenol with meals - not on active vit D but on nutritional 6.  Disp - needs SNF, decision pending  Kelly Splinter MD [employer] pgr [phone]   01/10/2018, 11:35 AM    Subjective:   No new c/o's, had sharp early BP drop in HD this am, now running at "keep even".   Objective Vitals:   01/10/18 0830 01/10/18 0900 01/10/18 0930 01/10/18 1000  BP: (!) 124/22 (!) 132/47 (!) 167/84 (!) 166/87  Pulse: 69 71 75 75  Resp: 18 18 18 18   Temp:      TempSrc:      SpO2:      Weight:      Height:       Physical Exam General: very needy - c/o muscular pain in abdomen Heart: RRR 3/6 murmur Lungs: grossly clear anteriory Abdomen: obese soft NT Extremities: no LE edema Dialysis Access: left BC AVF Qb 400   Additional Objective Labs: Basic Metabolic Panel: Recent Labs  Lab 01/04/18 1525 01/06/18 0822 01/10/18 0740  NA 140 139 132*  K 3.8 3.8 3.6  CL 102  --  94*  CO2 27  --  26  GLUCOSE 87 96 116*  BUN 16  --  32*  CREATININE 5.10*  --  6.96*  CALCIUM 8.0*  --  7.3*  PHOS  --   --  4.3   CBC: Recent Labs  Lab 01/04/18 1525 01/06/18 0822 01/10/18 0739  WBC 4.6  --  5.2  HGB 10.6* 12.9 8.5*  HCT 32.9* 38.0 25.5*  MCV 115.4*  --  112.8*  PLT 201  --  175   Blood Culture     Component Value Date/Time   SDES URINE, CLEAN CATCH 01/04/2018 1524   SPECREQUEST  01/04/2018 1524    NONE Performed at [facility], [place] [address], [place], [place] [address]    CULT MULTIPLE SPECIES PRESENT, SUGGEST RECOLLECTION (A) 01/04/2018 1524   REPTSTATUS 01/05/2018 FINAL 01/04/2018 1524    Cardiac Enzymes: No results for input(s): CKTOTAL, CKMB, CKMBINDEX, TROPONINI in the last 168 hours. CBG: Recent Labs  Lab 01/06/18 0751 01/06/18 1432 01/08/18 0759 01/10/18 0658 01/10/18 0809  GLUCAP 99 126* 140* 119* 117*   Iron Studies: No results for input(s): IRON, TIBC, TRANSFERRIN, FERRITIN in the last 72 hours. Lab Results  Component Value Date   INR 1.06 11/11/2016   INR 1.1 09/07/2008   INR 1.0 12/03/2006   Studies/Results: No results found. Medications: . methocarbamol (ROBAXIN) IV     . aspirin EC  81 mg Oral Daily  . carvedilol  25 mg Oral QHS  . Chlorhexidine Gluconate Cloth  6 each Topical Q0600  . Chlorhexidine Gluconate Cloth  6 each Topical Q0600  .  clonazePAM  0.5 mg Oral QHS  . docusate sodium  100 mg Oral BID  . ezetimibe  10 mg Oral Daily  . feeding supplement (ENSURE ENLIVE)  237 mL Oral BID BM  . fenofibrate  160 mg Oral Daily  . finasteride  2.5 mg Oral Q lunch  . lanthanum  2,000 mg Oral TID WC  . levothyroxine  50 mcg Oral QAC breakfast  . linagliptin  5 mg Oral Daily  . loperamide  2 mg Oral Q M,W,F-HD  . multivitamin  1 tablet Oral QHS  . nortriptyline  10 mg Oral QHS  . pantoprazole  40 mg Oral Daily  . Vitamin D (Ergocalciferol)  50,000 Units Oral Q30 days  . zinc sulfate  220 mg Oral Q1200

## 2018-01-11 ENCOUNTER — Telehealth (INDEPENDENT_AMBULATORY_CARE_PROVIDER_SITE_OTHER): Payer: Self-pay | Admitting: Orthopedic Surgery

## 2018-01-11 NOTE — Telephone Encounter (Signed)
Scott, OT at [facility] in [place]  Is needing limitations on ROM, and if there is a protocol it can be faxed to [phone]. He was also wondering about patients use of CPM machine.  Nicki Reaper needs a call back as soon as possible # [phone]

## 2018-01-11 NOTE — Telephone Encounter (Signed)
Pending response from Dr Dean  °

## 2018-01-12 NOTE — Telephone Encounter (Signed)
IC discussed per Dr Marlou Sa.

## 2018-01-12 NOTE — Telephone Encounter (Signed)
Please call Nicki Reaper to discuss patient care, Nicki Reaper found more progress notes from [facility] would like to discuss them. This is the number to the rehab facility can leave VM if needed. [phone]

## 2018-01-12 NOTE — Telephone Encounter (Signed)
Ok for 90 on CPM and strengthening at 6 wks post op ok per Dr Marlou Sa  I called Scott back, no answer. LMVM

## 2018-01-13 NOTE — Telephone Encounter (Signed)
IC Brent with [employer] and he is going to reach out to [facility] in regards to this. Per [employer] they do not typically service this particular area but he was going to see what he could do about getting in touch with facility and getting approval to set up shoulder CPM at facility for patient. He will touch base with me to let me know what he finds out.

## 2018-01-13 NOTE — Telephone Encounter (Signed)
Scott called again now saying that vendor can not get them a shoulder CPM machine and is wondering if they can do something different? CB # [phone]

## 2018-01-21 ENCOUNTER — Ambulatory Visit (INDEPENDENT_AMBULATORY_CARE_PROVIDER_SITE_OTHER): Payer: No Typology Code available for payment source

## 2018-01-21 ENCOUNTER — Ambulatory Visit (INDEPENDENT_AMBULATORY_CARE_PROVIDER_SITE_OTHER): Payer: No Typology Code available for payment source | Admitting: Orthopedic Surgery

## 2018-01-21 ENCOUNTER — Encounter (INDEPENDENT_AMBULATORY_CARE_PROVIDER_SITE_OTHER): Payer: Self-pay | Admitting: Orthopedic Surgery

## 2018-01-21 DIAGNOSIS — Z96611 Presence of right artificial shoulder joint: Secondary | ICD-10-CM | POA: Diagnosis not present

## 2018-01-22 ENCOUNTER — Encounter (INDEPENDENT_AMBULATORY_CARE_PROVIDER_SITE_OTHER): Payer: Self-pay | Admitting: Orthopedic Surgery

## 2018-01-22 NOTE — Progress Notes (Signed)
Post-Op Visit Note   Patient: Roberta Bryant           Date of Birth: 1951-05-19           MRN: [id number] Visit Date: 01/21/2018 PCP: Alanson Puls [facility]   Assessment & Plan:  Chief Complaint:  Chief Complaint  Patient presents with  . Right Shoulder - Routine Post Op   Visit Diagnoses:  1. Status post replacement of right shoulder joint     Plan: Tylar is a patient who is now about 2 weeks out right reverse shoulder replacement for severe right shoulder arthritis.  She is been doing well.  She is in a nursing home.  On examination she has much less pain with passive range of motion.  Radiographs look good.  Deltoid is functional.  Plan is home health physical therapy 2-3 times a week for 4 weeks.  Okay to use the walker with the right arm.  Discontinue sling.  4-week return for clinical recheck and likely release.  Follow-Up Instructions: Return in about 4 weeks (around 02/18/2018).   Orders:  Orders Placed This Encounter  Procedures  . XR Shoulder Right   No orders of the defined types were placed in this encounter.   Imaging: No results found.  PMFS History: Patient Active Problem List   Diagnosis Date Noted  . Shoulder arthritis 01/06/2018  . Rotator cuff arthropathy of right shoulder   . Status post arthroscopy of right shoulder 06/22/2017  . Spondylosis of cervical spine 06/16/2017  . Aortic stenosis 04/12/2017  . Coronary artery diseaseStatus post PTCA and stenting with drug-eluting stent to left main coronary artery in the summer 2017 04/12/2017  . 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 Headache syndrome 01/08/2017  . Right carpal tunnel syndrome 12/14/2016  . Complete tear of right rotator cuff 10/21/2016  . Gait abnormality 08/28/2016  . Chest pain 11/23/2015  . ESRD on dialysis ([place])   . Type 2 diabetes mellitus with complication ([place])   . Essential hypertension   . Abnormal ECG   . Pain in the chest   . Hypotension 10/30/2014  . Abnormal EKG  10/30/2014  . 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 End stage renal disease ([place]) 07/18/2012  . Other complications due to renal dialysis device, implant, and graft 07/18/2012   Past Medical History:  Diagnosis Date  . Anemia   . Aortic stenosis   . CHF (congestive heart failure) ([place])   . Childhood asthma   . Chronic upper back pain   . Coronary artery disease    Stent to left main coronary artery second of August 2017  . Decreased hearing   . ESRD (end stage renal disease) on dialysis [facility])    "MWF; [facility] in [place]" (01/06/2018)  . Gait abnormality 08/28/2016  . GERD (gastroesophageal reflux disease)   . Headache    "a few times/week" (10/30/2014)  . Headache syndrome 01/08/2017  . Heart murmur   . History of blood transfusion 1996   "related to menses"  . Hyperlipidemia   . Hypertension   . Hypothyroidism   . IBS (irritable bowel syndrome)   . Myocardial infarction ([place]) 2004  . Osteoarthritis   . Pneumonia    "years ago"  . Rotator cuff arthropathy of right shoulder   . Spondylosis of cervical spine 06/16/2017  . Stroke ([place]) 07/2016   mini stroke , right side of body weak-   . Type II diabetes mellitus ([place])     Family  History  Problem Relation Age of Onset  . Epilepsy Mother   . Diabetes Mother   . Cancer Mother   . Alcohol abuse Father     Past Surgical History:  Procedure Laterality Date  . APPENDECTOMY    . AV FISTULA PLACEMENT Left 12/03/2006   "forearm"  . AV FISTULA REPAIR Left 11/25/2009   "forearm"  . CARPAL TUNNEL RELEASE Right 02/11/2017   Procedure: RIGHT CARPAL TUNNEL RELEASE;  Surgeon: Daryll Brod, MD;  Location: [place];  Service: Orthopedics;  Laterality: Right;  . CHOLECYSTECTOMY OPEN  1970's  . COLONOSCOPY W/ POLYPECTOMY    . CORONARY ANGIOPLASTY    . CORONARY ANGIOPLASTY WITH STENT PLACEMENT    . INSERTION OF DIALYSIS CATHETER Right 2010   "chest"  . PARATHYROIDECTOMY  2010?   Parathyroid  autotransplantation   . REVERSE SHOULDER ARTHROPLASTY Right 01/06/2018  . REVERSE SHOULDER ARTHROPLASTY Right 01/06/2018   Procedure: RIGHT REVERSE SHOULDER ARTHROPLASTY;  Surgeon: [name], MD;  Location: [place];  Service: Orthopedics;  Laterality: Right;  . REVISON OF ARTERIOVENOUS FISTULA Left 12/04/2013   Procedure: REVISON OF ARTERIOVENOUS FISTULA;  Surgeon: Rosetta Posner, MD;  Location: [place];  Service: Vascular;  Laterality: Left;  . REVISON OF ARTERIOVENOUS FISTULA Left 01/07/5002   Procedure: PLICATION OF LEFT BRACHIOCEPHALIC  ARTERIOVENOUS FISTULA;  Surgeon: Conrad Monroeville, MD;  Location: [place];  Service: Vascular;  Laterality: Left;  . SHOULDER ARTHROSCOPY WITH ROTATOR CUFF REPAIR AND SUBACROMIAL DECOMPRESSION Right 06/22/2017   Procedure: RIGHT SHOULDER ARTHROSCOPY WITH EXTENSIVE DEBRIDEMENT;  Surgeon: Mcarthur Rossetti, MD;  Location: [place];  Service: Orthopedics;  Laterality: Right;  . TRIGGER FINGER RELEASE Right 10/22/2017   Procedure: RELEASE RIGHT INDEX TRIGGER FINGER/A-1 PULLEY;  Surgeon: Daryll Brod, MD;  Location: [place];  Service: Orthopedics;  Laterality: Right;  . TUBAL LIGATION  1989  . UMBILICAL HERNIA REPAIR  1990's X 2   Social History   Occupational History  . Occupation: Disabled  Tobacco Use  . Smoking status: Former Smoker    Years: 3.00    Types: Cigarettes  . Smokeless tobacco: Never Used  . Tobacco comment: "stopped smoking in the 1970's"  Substance and Sexual Activity  . Alcohol use: No  . Drug use: Yes    Types: Marijuana, Hydromorphone    Comment: as a teenager  . Sexual activity: Not Currently

## 2018-01-26 ENCOUNTER — Telehealth (INDEPENDENT_AMBULATORY_CARE_PROVIDER_SITE_OTHER): Payer: Self-pay | Admitting: Orthopedic Surgery

## 2018-01-26 MED ORDER — OXYCODONE HCL 5 MG PO TABS: ORAL_TABLET | ORAL | 0 refills | Status: DC

## 2018-01-26 NOTE — Telephone Encounter (Signed)
Ok to rf? 

## 2018-01-26 NOTE — Telephone Encounter (Signed)
rx printed. Patient can pick up at front desk to take to pharmacy.  I tried calling patient. No answer. LMVM advising unable to call in rx to pharmacy would need to come by office and pick up script to take to pharmacy.

## 2018-01-26 NOTE — Telephone Encounter (Signed)
y

## 2018-01-26 NOTE — Telephone Encounter (Signed)
Medication refill  Oxycodone  [address]    Patient called she was recently discharged from the nursing facility, The nursing facility informed  patient she wouldn't be able to take medicine home.

## 2018-02-21 ENCOUNTER — Encounter (INDEPENDENT_AMBULATORY_CARE_PROVIDER_SITE_OTHER): Payer: Self-pay | Admitting: Orthopedic Surgery

## 2018-02-21 ENCOUNTER — Ambulatory Visit (INDEPENDENT_AMBULATORY_CARE_PROVIDER_SITE_OTHER): Payer: Medicare Other | Admitting: Orthopedic Surgery

## 2018-02-21 DIAGNOSIS — M12811 Other specific arthropathies, not elsewhere classified, right shoulder: Secondary | ICD-10-CM

## 2018-02-22 ENCOUNTER — Encounter (INDEPENDENT_AMBULATORY_CARE_PROVIDER_SITE_OTHER): Payer: Self-pay | Admitting: Orthopedic Surgery

## 2018-02-22 NOTE — Progress Notes (Signed)
Post-Op Visit Note   Patient: Roberta Bryant           Date of Birth: [birth year]           MRN: [id number] Visit Date: 02/21/2018 PCP: [facility] P[facility]   Assessment & Plan:  Chief Complaint:  Chief Complaint  Patient presents with  . Right Shoulder - Follow-up, Routine Post Op   Visit Diagnoses:  1. Rotator cuff arthropathy of right shoulder     Plan: Patient is now about 6 weeks out right reverse shoulder replacement.  She is doing reasonably well.  She has not had physical therapy yet but only one home health physical therapy appointment.  On exam she has excellent passive range of motion and her deltoid is functional.  She actually can get up to about 90 degrees of forward flexion abduction.  She is taking oxycodone as needed with Tylenol.  I think in general she looks great.  Particularly considering the amount of therapy that she is had.  I think that she is getting the home health physical therapy avenues ironed out.  I will see her back in 6 weeks for clinical recheck.  Follow-Up Instructions: Return in about 6 weeks (around 04/04/2018).   Orders:  No orders of the defined types were placed in this encounter.  No orders of the defined types were placed in this encounter.   Imaging: No results found.  PMFS History: Patient Active Problem List   Diagnosis Date Noted  . Shoulder arthritis 01/06/2018  . Rotator cuff arthropathy of right shoulder   . Status post arthroscopy of right shoulder 06/22/2017  . Spondylosis of cervical spine 06/16/2017  . Aortic stenosis 04/12/2017  . Coronary artery diseaseStatus post PTCA and stenting with drug-eluting stent to left main coronary artery in the summer 2017 04/12/2017  . 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 Headache syndrome 01/08/2017  . Right carpal tunnel syndrome 12/14/2016  . Complete tear of right rotator cuff 10/21/2016  . Gait abnormality 08/28/2016  . Chest pain 11/23/2015  . ESRD on dialysis ([place])   .  Type 2 diabetes mellitus with complication ([place])   . Essential hypertension   . Abnormal ECG   . Pain in the chest   . Hypotension 10/30/2014  . Abnormal EKG 10/30/2014  . 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 End stage renal disease ([place]) 07/18/2012  . Other complications due to renal dialysis device, implant, and graft 07/18/2012   Past Medical History:  Diagnosis Date  . Anemia   . Aortic stenosis   . CHF (congestive heart failure) ([place])   . Childhood asthma   . Chronic upper back pain   . Coronary artery disease    Stent to left main coronary artery second of August 2017  . Decreased hearing   . ESRD (end stage renal disease) on dialysis [facility])    "MWF; [facility] in [place]" (01/06/2018)  . Gait abnormality 08/28/2016  . GERD (gastroesophageal reflux disease)   . Headache    "a few times/week" (10/30/2014)  . Headache syndrome 01/08/2017  . Heart murmur   . History of blood transfusion 1996   "related to menses"  . Hyperlipidemia   . Hypertension   . Hypothyroidism   . IBS (irritable bowel syndrome)   . Myocardial infarction ([place]) 2004  . Osteoarthritis   . Pneumonia    "years ago"  . Rotator cuff arthropathy of right shoulder   . Spondylosis of cervical spine 06/16/2017  .  Stroke ([place]) 07/2016   mini stroke , right side of body weak-   . Type II diabetes mellitus (HCC)     Family History  Problem Relation Age of Onset  . Epilepsy Mother   . Diabetes Mother   . Cancer Mother   . Alcohol abuse Father     Past Surgical History:  Procedure Laterality Date  . APPENDECTOMY    . AV FISTULA PLACEMENT Left 12/03/2006   "forearm"  . AV FISTULA REPAIR Left 11/25/2009   "forearm"  . CARPAL TUNNEL RELEASE Right 02/11/2017   Procedure: RIGHT CARPAL TUNNEL RELEASE;  Surgeon: Daryll Brod, MD;  Location: [place];  Service: Orthopedics;  Laterality: Right;  . CHOLECYSTECTOMY OPEN  1970's  . COLONOSCOPY W/ POLYPECTOMY    . CORONARY  ANGIOPLASTY    . CORONARY ANGIOPLASTY WITH STENT PLACEMENT    . INSERTION OF DIALYSIS CATHETER Right 2010   "chest"  . PARATHYROIDECTOMY  2010?   Parathyroid autotransplantation   . REVERSE SHOULDER ARTHROPLASTY Right 01/06/2018  . REVERSE SHOULDER ARTHROPLASTY Right 01/06/2018   Procedure: RIGHT REVERSE SHOULDER ARTHROPLASTY;  Surgeon: [name], MD;  Location: [facility];  Service: Orthopedics;  Laterality: Right;  . REVISON OF ARTERIOVENOUS FISTULA Left 12/04/2013   Procedure: REVISON OF ARTERIOVENOUS FISTULA;  Surgeon: Rosetta Posner, MD;  Location: [place];  Service: Vascular;  Laterality: Left;  . REVISON OF ARTERIOVENOUS FISTULA Left 06/13/1094   Procedure: PLICATION OF LEFT BRACHIOCEPHALIC  ARTERIOVENOUS FISTULA;  Surgeon: Conrad Sherman, MD;  Location: [facility];  Service: Vascular;  Laterality: Left;  . SHOULDER ARTHROSCOPY WITH ROTATOR CUFF REPAIR AND SUBACROMIAL DECOMPRESSION Right 06/22/2017   Procedure: RIGHT SHOULDER ARTHROSCOPY WITH EXTENSIVE DEBRIDEMENT;  Surgeon: Mcarthur Rossetti, MD;  Location: [place];  Service: Orthopedics;  Laterality: Right;  . TRIGGER FINGER RELEASE Right 10/22/2017   Procedure: RELEASE RIGHT INDEX TRIGGER FINGER/A-1 PULLEY;  Surgeon: Daryll Brod, MD;  Location: [place];  Service: Orthopedics;  Laterality: Right;  . TUBAL LIGATION  1989  . UMBILICAL HERNIA REPAIR  1990's X 2   Social History   Occupational History  . Occupation: Disabled  Tobacco Use  . Smoking status: Former Smoker    Years: 3.00    Types: Cigarettes  . Smokeless tobacco: Never Used  . Tobacco comment: "stopped smoking in the 1970's"  Substance and Sexual Activity  . Alcohol use: No  . Drug use: Yes    Types: Marijuana, Hydromorphone    Comment: as a teenager  . Sexual activity: Not Currently

## 2018-03-02 ENCOUNTER — Emergency Department (HOSPITAL_COMMUNITY): Payer: Medicare Other

## 2018-03-02 ENCOUNTER — Emergency Department (HOSPITAL_COMMUNITY)
Admission: EM | Admit: 2018-03-02 | Discharge: 2018-03-02 | Disposition: A | Discharge status: Home / Self Care | Payer: Medicare Other | Attending: Emergency Medicine | Admitting: Emergency Medicine

## 2018-03-02 ENCOUNTER — Other Ambulatory Visit: Payer: Self-pay

## 2018-03-02 ENCOUNTER — Encounter (HOSPITAL_COMMUNITY): Payer: Self-pay | Admitting: Emergency Medicine

## 2018-03-02 DIAGNOSIS — G20 Parkinson's disease: Secondary | ICD-10-CM | POA: Diagnosis not present

## 2018-03-02 DIAGNOSIS — W19XXXA Unspecified fall, initial encounter: Secondary | ICD-10-CM

## 2018-03-02 DIAGNOSIS — Z7984 Long term (current) use of oral hypoglycemic drugs: Secondary | ICD-10-CM | POA: Insufficient documentation

## 2018-03-02 DIAGNOSIS — M25512 Pain in left shoulder: Secondary | ICD-10-CM | POA: Diagnosis present

## 2018-03-02 DIAGNOSIS — E039 Hypothyroidism, unspecified: Secondary | ICD-10-CM | POA: Insufficient documentation

## 2018-03-02 DIAGNOSIS — I132 Hypertensive heart and chronic kidney disease with heart failure and with stage 5 chronic kidney disease, or end stage renal disease: Secondary | ICD-10-CM | POA: Insufficient documentation

## 2018-03-02 DIAGNOSIS — E1122 Type 2 diabetes mellitus with diabetic chronic kidney disease: Secondary | ICD-10-CM | POA: Insufficient documentation

## 2018-03-02 DIAGNOSIS — Z7982 Long term (current) use of aspirin: Secondary | ICD-10-CM | POA: Insufficient documentation

## 2018-03-02 DIAGNOSIS — Z8673 Personal history of transient ischemic attack (TIA), and cerebral infarction without residual deficits: Secondary | ICD-10-CM | POA: Diagnosis not present

## 2018-03-02 DIAGNOSIS — N186 End stage renal disease: Secondary | ICD-10-CM | POA: Diagnosis not present

## 2018-03-02 DIAGNOSIS — I509 Heart failure, unspecified: Secondary | ICD-10-CM | POA: Diagnosis not present

## 2018-03-02 DIAGNOSIS — W010XXA Fall on same level from slipping, tripping and stumbling without subsequent striking against object, initial encounter: Secondary | ICD-10-CM | POA: Diagnosis not present

## 2018-03-02 DIAGNOSIS — I2510 Atherosclerotic heart disease of native coronary artery without angina pectoris: Secondary | ICD-10-CM | POA: Insufficient documentation

## 2018-03-02 DIAGNOSIS — Z992 Dependence on renal dialysis: Secondary | ICD-10-CM | POA: Diagnosis not present

## 2018-03-02 DIAGNOSIS — Z87891 Personal history of nicotine dependence: Secondary | ICD-10-CM | POA: Insufficient documentation

## 2018-03-02 NOTE — ED Provider Notes (Signed)
Patient placed in Quick Look pathway, seen and evaluated   Chief Complaint: fall, left shoulder pain HPI:   STARASIA SINKO is a 67 y.o. female with ESRD on hemodialysis, presents after mechanical fall today, she reports she tripped and landed on her left shoulder and upper arm, she denies hitting her head or any loss of consciousness.  She reports pain in the left shoulder.  This is the arm where her fistula is located, she reports this may be slightly more swollen than usual but she is not having any severe pain here, no bruising and reports that overall looks the same.  ROS: + Shoulder pain, -numbness, weakness  Physical Exam:   Gen: No distress  Neuro: Awake and Alert  Skin: Warm    Focused Exam: Tenderness to palpation over the left shoulder without palpable bony deformity, no swelling.  Fistula with palpable thrill and good bruit, no focal tenderness to palpation over the fistula   Initiation of care has begun. The patient has been counseled on the process, plan, and necessity for staying for the completion/evaluation, and the remainder of the medical screening examination   Janet Berlin 03/02/18 Jac Canavan, MD 03/02/18 2335

## 2018-03-02 NOTE — ED Provider Notes (Signed)
[facility] Provider Note   CSN: [id number] Arrival date & time: 03/02/18  1843     History   Chief Complaint Chief Complaint  Patient presents with  . Shoulder Pain    HPI Roberta Bryant is a 67 y.o. female who presents with L shoulder pain. PMH significant for ESRD (goes M, W, F and last dialyzed Monday), Parkinson's disease with frequent falls, IBS, CHF, CAD, DM. She states that she felt like she was going to fall earlier this evening and tried to grab her son but missed him and then fell on to her L side. She was able to get up and iced it and took medicine for pain. The pain is moderate over the left shoulder and upper arm but her main concern is her fistula which is on that side. It is not bleeding and there is no bruising over the area. No numbness, tingling, weakness of the left arm. The pain is improving from earlier.  HPI  Past Medical History:  Diagnosis Date  . Anemia   . Aortic stenosis   . CHF (congestive heart failure) ([place])   . Childhood asthma   . Chronic upper back pain   . Coronary artery disease    Stent to left main coronary artery second of August 2017  . Decreased hearing   . ESRD (end stage renal disease) on dialysis [facility])    "MWF; [facility] in [place]" (01/06/2018)  . Gait abnormality 08/28/2016  . GERD (gastroesophageal reflux disease)   . Headache    "a few times/week" (10/30/2014)  . Headache syndrome 01/08/2017  . Heart murmur   . History of blood transfusion 1996   "related to menses"  . Hyperlipidemia   . Hypertension   . Hypothyroidism   . IBS (irritable bowel syndrome)   . Myocardial infarction ([place]) 2004  . Osteoarthritis   . Pneumonia    "years ago"  . Rotator cuff arthropathy of right shoulder   . Spondylosis of cervical spine 06/16/2017  . Stroke ([place]) 07/2016   mini stroke , right side of body weak-   . Type II diabetes mellitus [facility])     Patient Active Problem List   Diagnosis Date  Noted  . Shoulder arthritis 01/06/2018  . Rotator cuff arthropathy of right shoulder   . Status post arthroscopy of right shoulder 06/22/2017  . Spondylosis of cervical spine 06/16/2017  . Aortic stenosis 04/12/2017  . Coronary artery diseaseStatus post PTCA and stenting with drug-eluting stent to left main coronary artery in the summer 2017 04/12/2017  . 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 Headache syndrome 01/08/2017  . Right carpal tunnel syndrome 12/14/2016  . Complete tear of right rotator cuff 10/21/2016  . Gait abnormality 08/28/2016  . Chest pain 11/23/2015  . ESRD on dialysis ([place])   . Type 2 diabetes mellitus with complication ([place])   . Essential hypertension   . Abnormal ECG   . Pain in the chest   . Hypotension 10/30/2014  . Abnormal EKG 10/30/2014  . 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 End stage renal disease ([place]) 07/18/2012  . Other complications due to renal dialysis device, implant, and graft 07/18/2012    Past Surgical History:  Procedure Laterality Date  . APPENDECTOMY    . AV FISTULA PLACEMENT Left 12/03/2006   "forearm"  . AV FISTULA REPAIR Left 11/25/2009   "forearm"  . CARPAL TUNNEL RELEASE Right 02/11/2017   Procedure: RIGHT CARPAL TUNNEL RELEASE;  Surgeon:  Daryll Brod, MD;  Location: [facility];  Service: Orthopedics;  Laterality: Right;  . CHOLECYSTECTOMY OPEN  1970's  . COLONOSCOPY W/ POLYPECTOMY    . CORONARY ANGIOPLASTY    . CORONARY ANGIOPLASTY WITH STENT PLACEMENT    . INSERTION OF DIALYSIS CATHETER Right 2010   "chest"  . PARATHYROIDECTOMY  2010?   Parathyroid autotransplantation   . REVERSE SHOULDER ARTHROPLASTY Right 01/06/2018  . REVERSE SHOULDER ARTHROPLASTY Right 01/06/2018   Procedure: RIGHT REVERSE SHOULDER ARTHROPLASTY;  Surgeon: [name], MD;  Location: [facility];  Service: Orthopedics;  Laterality: Right;  . REVISON OF ARTERIOVENOUS FISTULA Left 12/04/2013   Procedure: REVISON OF ARTERIOVENOUS  FISTULA;  Surgeon: Rosetta Posner, MD;  Location: [facility];  Service: Vascular;  Laterality: Left;  . REVISON OF ARTERIOVENOUS FISTULA Left 10/14/5636   Procedure: PLICATION OF LEFT BRACHIOCEPHALIC  ARTERIOVENOUS FISTULA;  Surgeon: Conrad Claysville, MD;  Location: [facility];  Service: Vascular;  Laterality: Left;  . SHOULDER ARTHROSCOPY WITH ROTATOR CUFF REPAIR AND SUBACROMIAL DECOMPRESSION Right 06/22/2017   Procedure: RIGHT SHOULDER ARTHROSCOPY WITH EXTENSIVE DEBRIDEMENT;  Surgeon: Mcarthur Rossetti, MD;  Location: [facility];  Service: Orthopedics;  Laterality: Right;  . TRIGGER FINGER RELEASE Right 10/22/2017   Procedure: RELEASE RIGHT INDEX TRIGGER FINGER/A-1 PULLEY;  Surgeon: Daryll Brod, MD;  Location: [facility];  Service: Orthopedics;  Laterality: Right;  . TUBAL LIGATION  1989  . UMBILICAL HERNIA REPAIR  1990's X 2     OB History   None      Home Medications    Prior to Admission medications   Medication Sig Start Date End Date Taking? Authorizing Provider  acetaminophen (TYLENOL) 500 MG tablet Take 500-1,000 mg by mouth every 6 (six) hours as needed (FOR PAIN.).    [provider]  aspirin EC 81 MG tablet Take 81 mg by mouth daily.     [provider]  B Complex-C-Zn-Folic Acid (DIALYVITE 756 WITH ZINC) 0.8 MG TABS Take 1 tablet by mouth daily. 10/01/17   [provider]  carvedilol (COREG) 12.5 MG tablet Take 25 mg by mouth at bedtime.  08/06/16   [provider]  clonazePAM (KLONOPIN) 0.5 MG tablet TAKE 1 TABLET BY MOUTH AT BEDTIME 12/07/17   Kathrynn Ducking, MD  dimenhyDRINATE (DRAMAMINE) 50 MG tablet Take 50 mg by mouth every 8 (eight) hours as needed (for dizziness/vertigo).    [provider]  docusate sodium (COLACE) 100 MG capsule Take 100 mg by mouth 2 (two) times daily.    [provider]  ezetimibe (ZETIA) 10 MG tablet Take 10 mg by mouth daily. 05/10/16   [provider]  fenofibrate 160 MG tablet Take 160 mg by mouth daily.  03/08/17   [provider]  finasteride (PROSCAR) 5 MG tablet Take 2.5 mg by mouth daily with lunch.     [provider]  lanthanum (FOSRENOL) 1000 MG chewable tablet Chew 2,000 mg by mouth See admin instructions. TAKE 2 TABLETS (2000 MG) BY MOUTH DAILY AFTER EACH MEALS & 1 TABLET (1000 MG) BY MOUTH WITH SNACKS.    [provider]  levothyroxine (SYNTHROID, LEVOTHROID) 50 MCG tablet Take 50 mcg by mouth daily before breakfast.     [provider]  lidocaine-prilocaine (EMLA) cream Apply 1 application topically See admin instructions. Apply a small amount to access site 1-2 hours before dialysis--cover with occlusive dressing 09/24/17   [provider]  liraglutide (VICTOZA) 18 MG/3ML SOPN Inject 1.8 mg into  the skin daily.     [provider]  loperamide (IMODIUM A-D) 2 MG tablet Take 2 mg by mouth every Monday, Wednesday, and Friday with hemodialysis.     [provider]  nortriptyline (PAMELOR) 10 MG capsule Take one capsule at night for one week, then take 2 capsules at night Patient taking differently: Take 10 mg by mouth at bedtime.  09/14/17   Kathrynn Ducking, MD  omeprazole (PRILOSEC) 20 MG capsule Take 40 mg by mouth 2 (two) times daily before a meal.     [provider]  oxyCODONE (OXY IR/ROXICODONE) 5 MG immediate release tablet 1 po q 8-12hrs prn pain 01/26/18   [name], MD  sitaGLIPtin (JANUVIA) 25 MG tablet Take 25 mg by mouth daily with lunch.     [provider]  Turmeric Curcumin 500 MG CAPS Take 500 mg by mouth daily with lunch.    [provider]  Vitamin D, Ergocalciferol, (DRISDOL) 50000 units CAPS capsule Take 50,000 Units by mouth every 30 (thirty) days. 09/06/17   [provider]  Zinc 50 MG CAPS Take 50 mg by mouth daily with lunch.     [provider]    Family History Family History  Problem Relation Age of Onset  . Epilepsy Mother   . Diabetes Mother   .  Cancer Mother   . Alcohol abuse Father     Social History Social History   Tobacco Use  . Smoking status: Former Smoker    Years: 3.00    Types: Cigarettes  . Smokeless tobacco: Never Used  . Tobacco comment: "stopped smoking in the 1970's"  Substance Use Topics  . Alcohol use: No  . Drug use: Yes    Types: Marijuana, Hydromorphone    Comment: as a teenager     Allergies   Sulfa antibiotics; Amoxicillin; Crestor [rosuvastatin calcium]; Ibuprofen; Naldecon senior [guaifenesin]; Statins; Chlorphen-phenyleph-asa; and Gabapentin   Review of Systems Review of Systems  Musculoskeletal: Positive for arthralgias.  Skin: Negative for color change and wound.  Neurological: Negative for weakness and numbness.  All other systems reviewed and are negative.    Physical Exam Updated Vital Signs BP (!) 149/80   Pulse 78   Temp 98.2 F (36.8 C) (Oral)   Resp 18   SpO2 95%   Physical Exam  Constitutional: She is oriented to person, place, and time. She appears well-developed and well-nourished. No distress.  HENT:  Head: Normocephalic and atraumatic.  Eyes: Pupils are equal, round, and reactive to light. Conjunctivae are normal. Right eye exhibits no discharge. Left eye exhibits no discharge. No scleral icterus.  Neck: Normal range of motion.  Cardiovascular: Normal rate and regular rhythm.  Pulmonary/Chest: Effort normal and breath sounds normal. No respiratory distress.  Abdominal: She exhibits no distension.  Musculoskeletal:  Left upper extremity: No obvious swelling, deformity, or warmth. Fistula has some bruising which the patient states that this is from the last time it was used. No tenderness. Palpable thrill. Tenderness to palpation of lateral and anterior shoulder. FROM of shoulder and elbow without significant pain. 5/5 strength. 2+ radial pulse   Neurological: She is alert and oriented to person, place, and time.  Skin: Skin is warm and dry.  Psychiatric: She has a  normal mood and affect. Her behavior is normal.  Nursing note and vitals reviewed.    ED Treatments / Results  Labs (all labs ordered are listed, but only abnormal results are displayed) Labs Reviewed -  No data to display  EKG None  Radiology Dg Shoulder Left  Result Date: 03/02/2018 CLINICAL DATA:  Fall, LEFT shoulder pain. EXAM: LEFT SHOULDER - 2+ VIEW COMPARISON:  None. FINDINGS: The humeral head is well-formed and located. Mild irregularity greater tuberosity compatible with osteoarthrosis. The subacromial, glenohumeral and acromioclavicular joint spaces are intact. No destructive bony lesions. Soft tissue planes are non-suspicious. Surgical clips LEFT chest and LEFT humeral soft tissues. IMPRESSION: No acute fracture deformity or dislocation. Electronically Signed   By: Elon Alas M.D.   On: 03/02/2018 20:43    Procedures Procedures (including critical care time)  Medications Ordered in ED Medications - No data to display   Initial Impression / Assessment and Plan / ED Course  I have reviewed the triage vital signs and the nursing notes.  Pertinent labs & imaging results that were available during my care of the patient were reviewed by me and considered in my medical decision making (see chart for details).  67 year old female presents with mechanical fall earlier tonight. She is mainly concerned about her fistula which appears to be normal. There is no bleeding or new bruising. It has a palpable thrill and she has a normal distal pulse. She has FROM of her shoulder and elbow. Xray is negative for bony pathology. She was offered a sling and declined. She was encouraged to continue supportive care and to f/u with dialysis center on Friday.  Final Clinical Impressions(s) / ED Diagnoses   Final diagnoses:  Fall  Fall, initial encounter  Acute pain of left shoulder    ED Discharge Orders    None       Recardo Evangelist, PA-C 03/02/18 2313    Julianne Rice, MD 03/06/18 1159

## 2018-03-02 NOTE — ED Triage Notes (Signed)
Pt presents to ED for assessment left shoulder pain after a trip and fall earlier.  Fistula in same arm.  Bruit and thrill present.  Pt c/o some swelling to area.

## 2018-03-02 NOTE — Discharge Instructions (Signed)
Please continue to rest and ice the arm Take medicine for pain as needed Follow up with dialysis center on Friday Return if you are worsening

## 2018-03-02 NOTE — ED Notes (Signed)
Results reviewed.  No changes in acuity at this time 

## 2018-03-07 ENCOUNTER — Ambulatory Visit: Payer: Medicare Other | Admitting: Neurology

## 2018-03-10 ENCOUNTER — Encounter (INDEPENDENT_AMBULATORY_CARE_PROVIDER_SITE_OTHER): Payer: Self-pay | Admitting: Orthopedic Surgery

## 2018-03-10 ENCOUNTER — Ambulatory Visit (INDEPENDENT_AMBULATORY_CARE_PROVIDER_SITE_OTHER): Payer: Medicare Other

## 2018-03-10 ENCOUNTER — Ambulatory Visit (INDEPENDENT_AMBULATORY_CARE_PROVIDER_SITE_OTHER): Payer: Medicare Other | Admitting: Orthopedic Surgery

## 2018-03-10 DIAGNOSIS — M25531 Pain in right wrist: Secondary | ICD-10-CM

## 2018-03-10 DIAGNOSIS — M25532 Pain in left wrist: Secondary | ICD-10-CM

## 2018-03-10 DIAGNOSIS — Z96611 Presence of right artificial shoulder joint: Secondary | ICD-10-CM | POA: Diagnosis not present

## 2018-03-10 DIAGNOSIS — I2510 Atherosclerotic heart disease of native coronary artery without angina pectoris: Secondary | ICD-10-CM | POA: Diagnosis not present

## 2018-03-10 NOTE — Progress Notes (Signed)
Office Visit Note   Patient: Roberta Bryant           Date of Birth: Jun 06, 1951           MRN: [id number] Visit Date: 03/10/2018 Requested by: [facility] [address] [place], [place] [address] PCP: Alanson Puls, [facility]  Subjective: Chief Complaint  Patient presents with  . Right Shoulder - Pain  . Right Wrist - Pain  . Left Wrist - Pain    HPI: Patient presents now 2 months out right reverse shoulder replacement.  She had a fall on Monday and she hit her arm and bilateral wrists.  She is been having some pain.  She also has bilateral knee arthritis.              ROS: All systems reviewed are negative as they relate to the chief complaint within the history of present illness.  Patient denies  fevers or chills.   Assessment & Plan: Visit Diagnoses:  1. Bilateral wrist pain   2. Status post replacement of right shoulder joint     Plan: Impression is no evidence of fracture or dislocation to the reverse shoulder replacement.  Both wrists are also potentially affected.  I see no definite fracture in either wrist although there was slight cortical irregularity on one view.  Bone quality is very poor.  In general clinically she is not particularly swollen nor that painful that I would expect it to be a fracture but nonetheless I think compression wrapping and limited activity for that wrist for the next 2 weeks as indicated.  We will check her back in a few months for clinical recheck or sooner should the wrist symptoms worsen.  The shoulder did not sustained apparent injury from the fall.  Follow-Up Instructions: Return if symptoms worsen or fail to improve.   Orders:  Orders Placed This Encounter  Procedures  . XR Shoulder Right  . XR Wrist Complete Right  . XR Wrist Complete Left   No orders of the defined types were placed in this encounter.     Procedures: No procedures performed   Clinical Data: No additional  findings.  Objective: Vital Signs: There were no vitals taken for this visit.  Physical Exam:   Constitutional: Patient appears well-developed HEENT:  Head: Normocephalic Eyes:EOM are normal Neck: Normal range of motion Cardiovascular: Normal rate Pulmonary/chest: Effort normal Neurologic: Patient is alert Skin: Skin is warm Psychiatric: Patient has normal mood and affect    Ortho Exam: Ortho exam demonstrates functional right shoulder deltoid strength with good passive range of motion.  No coarseness or grinding with passive range of motion.  Both wrists are examined and she has pretty reasonable flexion extension radial ulnar deviation.  Palpable radial pulse on the right.  She has a fistula on the left.  I do not detect any coarse grinding or crepitus with passive range of motion and there is no bruising or excessive swelling in the right wrist or left wrist region.  Grip strength is pretty symmetric. Specialty Comments:  No specialty comments available.  Imaging: Xr Wrist Complete Left  Result Date: 03/10/2018 AP lateral oblique left wrist reviewed.  There is some slight widening of the scapholunate interval.  Bones are osteopenic.  No acute fracture visualized in the distal radius or ulna.  Xr Wrist Complete Right  Result Date: 03/10/2018 AP lateral oblique right wrist reviewed.  Bones are osteopenic.  Scapholunate interval widened.   Mild degenerative changes  present throughout the intercarpal space.  On the oblique view there is a 1 mm step-off of 1 cortical line.  This is not seen on the AP view.  If there is clinical concern CT scanning could be used  Xr Shoulder Right  Result Date: 03/10/2018 AP outlet axillary right shoulder reviewed.  Reverse shoulder replacement in good position and alignment with no dislocation or complicating features.  Humeral shaft without fracture or evidence of loosening.    PMFS History: Patient Active Problem List   Diagnosis Date Noted   . Shoulder arthritis 01/06/2018  . Rotator cuff arthropathy of right shoulder   . Status post arthroscopy of right shoulder 06/22/2017  . Spondylosis of cervical spine 06/16/2017  . Aortic stenosis 04/12/2017  . Coronary artery diseaseStatus post PTCA and stenting with drug-eluting stent to left main coronary artery in the summer 2017 04/12/2017  . 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 Headache syndrome 01/08/2017  . Right carpal tunnel syndrome 12/14/2016  . Complete tear of right rotator cuff 10/21/2016  . Gait abnormality 08/28/2016  . Chest pain 11/23/2015  . ESRD on dialysis ([place])   . Type 2 diabetes mellitus with complication ([place])   . Essential hypertension   . Abnormal ECG   . Pain in the chest   . Hypotension 10/30/2014  . Abnormal EKG 10/30/2014  . 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 End stage renal disease ([place]) 07/18/2012  . Other complications due to renal dialysis device, implant, and graft 07/18/2012   Past Medical History:  Diagnosis Date  . Anemia   . Aortic stenosis   . CHF (congestive heart failure) ([place])   . Childhood asthma   . Chronic upper back pain   . Coronary artery disease    Stent to left main coronary artery second of August 2017  . Decreased hearing   . ESRD (end stage renal disease) on dialysis [facility])    "MWF; [facility] in [place]" (01/06/2018)  . Gait abnormality 08/28/2016  . GERD (gastroesophageal reflux disease)   . Headache    "a few times/week" (10/30/2014)  . Headache syndrome 01/08/2017  . Heart murmur   . History of blood transfusion 1996   "related to menses"  . Hyperlipidemia   . Hypertension   . Hypothyroidism   . IBS (irritable bowel syndrome)   . Myocardial infarction ([place]) 2004  . Osteoarthritis   . Pneumonia    "years ago"  . Rotator cuff arthropathy of right shoulder   . Spondylosis of cervical spine 06/16/2017  . Stroke ([place]) 07/2016   mini stroke , right side of body weak-   . Type II diabetes mellitus  (HCC)     Family History  Problem Relation Age of Onset  . Epilepsy Mother   . Diabetes Mother   . Cancer Mother   . Alcohol abuse Father     Past Surgical History:  Procedure Laterality Date  . APPENDECTOMY    . AV FISTULA PLACEMENT Left 12/03/2006   "forearm"  . AV FISTULA REPAIR Left 11/25/2009   "forearm"  . CARPAL TUNNEL RELEASE Right 02/11/2017   Procedure: RIGHT CARPAL TUNNEL RELEASE;  Surgeon: Daryll Brod, MD;  Location: [facility];  Service: Orthopedics;  Laterality: Right;  . CHOLECYSTECTOMY OPEN  1970's  . COLONOSCOPY W/ POLYPECTOMY    . CORONARY ANGIOPLASTY    . CORONARY ANGIOPLASTY WITH STENT PLACEMENT    . INSERTION OF DIALYSIS CATHETER Right 2010   "chest"  . PARATHYROIDECTOMY  2010?  Parathyroid autotransplantation   . REVERSE SHOULDER ARTHROPLASTY Right 01/06/2018  . REVERSE SHOULDER ARTHROPLASTY Right 01/06/2018   Procedure: RIGHT REVERSE SHOULDER ARTHROPLASTY;  Surgeon: [name], MD;  Location: [facility];  Service: Orthopedics;  Laterality: Right;  . REVISON OF ARTERIOVENOUS FISTULA Left 12/04/2013   Procedure: REVISON OF ARTERIOVENOUS FISTULA;  Surgeon: Rosetta Posner, MD;  Location: [facility];  Service: Vascular;  Laterality: Left;  . REVISON OF ARTERIOVENOUS FISTULA Left 08/08/3555   Procedure: PLICATION OF LEFT BRACHIOCEPHALIC  ARTERIOVENOUS FISTULA;  Surgeon: Conrad Alger, MD;  Location: [facility];  Service: Vascular;  Laterality: Left;  . SHOULDER ARTHROSCOPY WITH ROTATOR CUFF REPAIR AND SUBACROMIAL DECOMPRESSION Right 06/22/2017   Procedure: RIGHT SHOULDER ARTHROSCOPY WITH EXTENSIVE DEBRIDEMENT;  Surgeon: Mcarthur Rossetti, MD;  Location: [facility];  Service: Orthopedics;  Laterality: Right;  . TRIGGER FINGER RELEASE Right 10/22/2017   Procedure: RELEASE RIGHT INDEX TRIGGER FINGER/A-1 PULLEY;  Surgeon: Daryll Brod, MD;  Location: [facility];  Service: Orthopedics;  Laterality: Right;  . TUBAL LIGATION  1989  . UMBILICAL HERNIA REPAIR  1990's X 2    Social History   Occupational History  . Occupation: Disabled  Tobacco Use  . Smoking status: Former Smoker    Years: 3.00    Types: Cigarettes  . Smokeless tobacco: Never Used  . Tobacco comment: "stopped smoking in the 1970's"  Substance and Sexual Activity  . Alcohol use: No  . Drug use: Yes    Types: Marijuana, Hydromorphone    Comment: as a teenager  . Sexual activity: Not Currently

## 2018-03-15 ENCOUNTER — Ambulatory Visit (INDEPENDENT_AMBULATORY_CARE_PROVIDER_SITE_OTHER): Payer: Medicare Other | Admitting: Family Medicine

## 2018-04-02 ENCOUNTER — Other Ambulatory Visit: Payer: Self-pay | Admitting: Neurology

## 2018-04-07 ENCOUNTER — Ambulatory Visit (INDEPENDENT_AMBULATORY_CARE_PROVIDER_SITE_OTHER): Payer: Medicare Other | Admitting: Orthopedic Surgery

## 2018-04-11 ENCOUNTER — Telehealth: Payer: Self-pay | Admitting: *Deleted

## 2018-04-11 MED ORDER — CLONAZEPAM 0.5 MG PO TABS: 0.5000 mg | ORAL_TABLET | Freq: Every day | ORAL | 2 refills | Status: DC

## 2018-04-11 NOTE — Telephone Encounter (Signed)
The RX for the clonazepam was sent in.

## 2018-04-11 NOTE — Telephone Encounter (Signed)
Pt. last seen in April for h/a,np; never made her 5 mo. f/u appt.  She is requesting r/f of Clonazepam. I spoke with her this am and gave w/i appt. for f/u, on 04/19/18.  I explained to her that, as Clonazepam is a controlled substance, the physician has to see her regularly to continue rx'ing this, in order to ensure it is still a safe and appropriate med for her to take. She verbalized understanding of same, and sts. she will "try" to be at appt. on 11/12, which was sched. on a Tues in order to not conflict with her dialysis schedule. Will check with [facility] to see if he is able to fill Clonazepam prior to pt's appt./fim

## 2018-04-19 ENCOUNTER — Ambulatory Visit: Payer: Self-pay | Admitting: Neurology

## 2018-05-12 ENCOUNTER — Encounter: Payer: Self-pay | Admitting: Gastroenterology

## 2018-05-13 ENCOUNTER — Ambulatory Visit (INDEPENDENT_AMBULATORY_CARE_PROVIDER_SITE_OTHER): Payer: Medicare Other | Admitting: Gastroenterology

## 2018-05-13 ENCOUNTER — Encounter: Payer: Self-pay | Admitting: Gastroenterology

## 2018-05-13 VITALS — BP 138/78 | HR 72 | Ht 58.5 in | Wt 185.1 lb

## 2018-05-13 DIAGNOSIS — R197 Diarrhea, unspecified: Secondary | ICD-10-CM | POA: Diagnosis not present

## 2018-05-13 NOTE — Progress Notes (Signed)
Chief Complaint: Diarrhea  Referring Provider:  Pllc, [facility] *      ASSESSMENT AND PLAN;   #1.  Diarrhea. Neg colonoscopy with TI and random colonic biopsies 07/2010, EGD 10/2012 showing hyperplastic gastric polyps, negative small bowel biopsies for celiac disease.  Exacerbated by medications.  Element of postcholecystectomy diarrhea. S/p recent shoulder surgery  #2.  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 - Stop colace. - For now we will continue rest of the medications. - She will go over all medications with her family physician Dr. Jannette Fogo. - She will call us in 1 week.  If still with problems, consider discontinuing Victoza or adding cholestyramine.  At that point, we will also add Lomotil.  She wants to hold off on repeat colonoscopy at the present time.  I do agree.  HPI:    Roberta Bryant is a 67 y.o. female  With "explosive diarrhea" 6-7 times per day over the last 6 to 8 months. She has been taking Colace twice a day. Also been taking multiple medications occluding Victoza-told me that she is not a diabetic.  However, her past medical history does mention diabetes.  She will discuss above with Dr. Jannette Fogo.  Is having any significant nausea, vomiting, heartburn, regurgitation, odynophagia or dysphagia.  Does complain of epigastric discomfort after eating followed by abdominal bloating followed by diarrhea.  No melena or hematochezia.   Past Medical History:  Diagnosis Date  . Anemia   . Aortic stenosis   . CHF (congestive heart failure) ([place])   . Childhood asthma   . Chronic upper back pain   . Coronary artery disease    Stent to left main coronary artery second of August 2017  . Decreased hearing   . ESRD (end stage renal disease) on dialysis [facility])    "MWF; [facility] in [place]" (01/06/2018)  . Gait abnormality 08/28/2016  . GERD (gastroesophageal reflux disease)   .  Headache    "a few times/week" (10/30/2014)  . Headache syndrome 01/08/2017  . Heart murmur   . History of blood transfusion 1996   "related to menses"  . Hyperlipidemia   . Hypertension   . Hypothyroidism   . IBS (irritable bowel syndrome)   . Myocardial infarction ([place]) 2004  . Osteoarthritis   . Pneumonia    "years ago"  . Rotator cuff arthropathy of right shoulder   . Spondylosis of cervical spine 06/16/2017  . Stroke ([place]) 07/2016   mini stroke , right side of body weak-   . Type II diabetes mellitus ([place])     Past Surgical History:  Procedure Laterality Date  . APPENDECTOMY    . AV FISTULA PLACEMENT Left 12/03/2006   "forearm"  . AV FISTULA REPAIR Left 11/25/2009   "forearm"  . CARPAL TUNNEL RELEASE Right 02/11/2017   Procedure: RIGHT CARPAL TUNNEL RELEASE;  Surgeon: Daryll Brod, MD;  Location: [place];  Service: Orthopedics;  Laterality: Right;  . CHOLECYSTECTOMY OPEN  1970's  . COLONOSCOPY W/ POLYPECTOMY  07/30/2010   Minimal sigmoid diverticulosis. Small internal hemorrhoids. Otherwise normal colonoscopy to TI.   Marland Kitchen CORONARY ANGIOPLASTY    . CORONARY ANGIOPLASTY WITH STENT PLACEMENT    . ESOPHAGOGASTRODUODENOSCOPY  10/21/2012   Gastric polyps- status post polypectomy. Mild gastritis.   . INSERTION OF DIALYSIS CATHETER Right 2010   "chest"  . PARATHYROIDECTOMY  2010?   Parathyroid autotransplantation   .  REVERSE SHOULDER ARTHROPLASTY Right 01/06/2018  . REVERSE SHOULDER ARTHROPLASTY Right 01/06/2018   Procedure: RIGHT REVERSE SHOULDER ARTHROPLASTY;  Surgeon: [name], MD;  Location: [place];  Service: Orthopedics;  Laterality: Right;  . REVISON OF ARTERIOVENOUS FISTULA Left 12/04/2013   Procedure: REVISON OF ARTERIOVENOUS FISTULA;  Surgeon: Rosetta Posner, MD;  Location: [place];  Service: Vascular;  Laterality: Left;  . REVISON OF ARTERIOVENOUS FISTULA Left 1/0/2725   Procedure: PLICATION OF LEFT BRACHIOCEPHALIC  ARTERIOVENOUS FISTULA;  Surgeon:  Conrad [place], MD;  Location: [place];  Service: Vascular;  Laterality: Left;  . SHOULDER ARTHROSCOPY WITH ROTATOR CUFF REPAIR AND SUBACROMIAL DECOMPRESSION Right 06/22/2017   Procedure: RIGHT SHOULDER ARTHROSCOPY WITH EXTENSIVE DEBRIDEMENT;  Surgeon: Mcarthur Rossetti, MD;  Location: [place];  Service: Orthopedics;  Laterality: Right;  . TRIGGER FINGER RELEASE Right 10/22/2017   Procedure: RELEASE RIGHT INDEX TRIGGER FINGER/A-1 PULLEY;  Surgeon: Daryll Brod, MD;  Location: [place];  Service: Orthopedics;  Laterality: Right;  . TUBAL LIGATION  1989  . UMBILICAL HERNIA REPAIR  1990's X 2    Family History  Problem Relation Age of Onset  . Epilepsy Mother   . Diabetes Mother   . Cancer Mother   . Alcohol abuse Father     Social History   Tobacco Use  . Smoking status: Former Smoker    Years: 3.00    Types: Cigarettes  . Smokeless tobacco: Never Used  . Tobacco comment: "stopped smoking in the 1970's"  Substance Use Topics  . Alcohol use: No  . Drug use: Yes    Types: Marijuana, Hydromorphone    Comment: as a teenager    Current Outpatient Medications  Medication Sig Dispense Refill  . acetaminophen (TYLENOL) 500 MG tablet Take 500-1,000 mg by mouth every 6 (six) hours as needed (FOR PAIN.).    Marland Kitchen aspirin EC 81 MG tablet Take 81 mg by mouth daily.     . B Complex-C-Zn-Folic Acid (DIALYVITE 366 WITH ZINC) 0.8 MG TABS Take 1 tablet by mouth daily.  12  . carvedilol (COREG) 12.5 MG tablet Take 25 mg by mouth at bedtime.   2  . clonazePAM (KLONOPIN) 0.5 MG tablet Take 1 tablet (0.5 mg total) by mouth at bedtime. 30 tablet 2  . dimenhyDRINATE (DRAMAMINE) 50 MG tablet Take 50 mg by mouth every 8 (eight) hours as needed (for dizziness/vertigo).    Marland Kitchen docusate sodium (COLACE) 100 MG capsule Take 100 mg by mouth 2 (two) times daily.    Marland Kitchen ezetimibe (ZETIA) 10 MG tablet Take 10 mg by mouth daily.  1  . fenofibrate 160 MG tablet Take 160 mg by mouth daily.  1  . finasteride (PROSCAR) 5 MG  tablet Take 2.5 mg by mouth daily with lunch.     . lanthanum (FOSRENOL) 1000 MG chewable tablet Chew 2,000 mg by mouth See admin instructions. TAKE 2 TABLETS (2000 MG) BY MOUTH DAILY AFTER EACH MEALS & 1 TABLET (1000 MG) BY MOUTH WITH SNACKS.    Marland Kitchen levothyroxine (SYNTHROID, LEVOTHROID) 50 MCG tablet Take 50 mcg by mouth daily before breakfast.     . lidocaine-prilocaine (EMLA) cream Apply 1 application topically See admin instructions. Apply a small amount to access site 1-2 hours before dialysis--cover with occlusive dressing  12  . liraglutide (VICTOZA) 18 MG/3ML SOPN Inject 1.8 mg into the skin daily.     Marland Kitchen loperamide (IMODIUM A-D) 2 MG tablet Take 2 mg by mouth every Monday, Wednesday, and Friday with  hemodialysis.     Marland Kitchen nortriptyline (PAMELOR) 10 MG capsule Take one capsule at night for one week, then take 2 capsules at night (Patient taking differently: Take 10 mg by mouth at bedtime. ) 60 capsule 3  . omeprazole (PRILOSEC) 20 MG capsule Take 40 mg by mouth 2 (two) times daily before a meal.     . oxyCODONE (OXY IR/ROXICODONE) 5 MG immediate release tablet 1 po q 8-12hrs prn pain 40 tablet 0  . sitaGLIPtin (JANUVIA) 25 MG tablet Take 25 mg by mouth daily with lunch.     . Turmeric Curcumin 500 MG CAPS Take 500 mg by mouth daily with lunch.    . Vitamin D, Ergocalciferol, (DRISDOL) 50000 units CAPS capsule Take 50,000 Units by mouth every 30 (thirty) days.  2  . Zinc 50 MG CAPS Take 50 mg by mouth daily with lunch.      No current facility-administered medications for this visit.     Allergies  Allergen Reactions  . Sulfa Antibiotics Anaphylaxis  . Amoxicillin Hives    Has patient had a PCN reaction causing immediate rash, facial/tongue/throat swelling, SOB or lightheadedness with hypotension: No Has patient had a PCN reaction causing severe rash involving mucus membranes or skin necrosis: No Has patient had a PCN reaction that required hospitalization: No Has patient had a PCN reaction  occurring within the last 10 years: Unknown If all of the above answers are "NO", then may proceed with Cephalosporin use.   Marland Kitchen Crestor [Rosuvastatin Calcium] Other (See Comments)    Dizziness, couldn't walk  . Ibuprofen Hives  . Naldecon Senior [employer]  . Statins Hives  . Chlorphen-Phenyleph-Asa Rash  . Gabapentin Other (See Comments)    Dizzy    Review of Systems:  Neg except for anxiety/depression Focal weakness due to CVA Has generalized osteoarthritis Shoulder pain despite of shoulder surgery      Physical Exam:    BP 138/78   Pulse 72   Ht 4' 10.5" (1.486 m)   Wt 185 lb 2 oz (84 kg)   SpO2 96%   BMI 38.03 kg/m  Filed Weights   05/13/18 1623  Weight: 185 lb 2 oz (84 kg)   Constitutional:  Well-developed, in no acute distress. Psychiatric: Normal mood and affect. Behavior is normal. HEENT: Pupils normal.  Conjunctivae are normal. No scleral icterus. Neck supple.  Cardiovascular: Normal rate, regular rhythm. No edema Pulmonary/chest: Effort normal and breath sounds normal. No wheezing, rales or rhonchi. Abdominal: Soft, nondistended. Nontender. Bowel sounds active throughout. There are no masses palpable. No hepatomegaly.  Well-healed surgical scars. Rectal:  defered Neurological: Alert and oriented to person place and time. Skin: Skin is warm and dry. No rashes noted.   Carmell Austria, MD 05/13/2018, 4:30 PM  Cc: Alanson Puls, [facility] *

## 2018-05-13 NOTE — Patient Instructions (Addendum)
If you are age 67 or older, your body mass index should be between 23-30. Your Body mass index is 38.03 kg/m. If this is out of the aforementioned range listed, please consider follow up with your Primary Care Provider.  If you are age 58 or younger, your body mass index should be between 19-25. Your Body mass index is 38.03 kg/m. If this is out of the aformentioned range listed, please consider follow up with your Primary Care Provider.   Stop taking your Colace.  Call us in two weeks with an update on your diarrhea.  Please bring your stool specimen back to the lab on the 2nd floor [address].  Thank you,  Dr. Jackquline Denmark

## 2018-05-19 ENCOUNTER — Encounter: Payer: Self-pay | Admitting: *Deleted

## 2018-05-19 ENCOUNTER — Ambulatory Visit (INDEPENDENT_AMBULATORY_CARE_PROVIDER_SITE_OTHER): Payer: Medicare Other | Admitting: Family

## 2018-05-19 ENCOUNTER — Other Ambulatory Visit: Payer: Self-pay | Admitting: *Deleted

## 2018-05-19 ENCOUNTER — Encounter: Payer: Self-pay | Admitting: Family

## 2018-05-19 VITALS — BP 112/70 | HR 70 | Temp 98.2°F | Resp 14 | Ht 58.5 in | Wt 185.0 lb

## 2018-05-19 DIAGNOSIS — Z992 Dependence on renal dialysis: Secondary | ICD-10-CM

## 2018-05-19 DIAGNOSIS — I2510 Atherosclerotic heart disease of native coronary artery without angina pectoris: Secondary | ICD-10-CM

## 2018-05-19 DIAGNOSIS — N186 End stage renal disease: Secondary | ICD-10-CM

## 2018-05-19 DIAGNOSIS — T82898D Other specified complication of vascular prosthetic devices, implants and grafts, subsequent encounter: Secondary | ICD-10-CM | POA: Diagnosis not present

## 2018-05-19 NOTE — H&P (View-Only) (Signed)
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 ([birth year]) female who is s/p left brachiocephalic arteriovenous fistula plication (Date: 07/17/77) by Dr. Bridgett Larsson.   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  She dialyzes M-W-F.  She has been using this AVF since 2008.  Pt states there are no problems with HD. She denies steal sx's in left UE. She has had a stroke and has considerable right hemiparesis. She was right hand dominant.    Past Medical History:  Diagnosis Date  . Anemia   . Aortic stenosis   . CHF (congestive heart failure) ([place])   . Childhood asthma   . Chronic upper back pain   . Coronary artery disease    Stent to left main coronary artery second of August 2017  . Decreased hearing   . ESRD (end stage renal disease) on dialysis [facility])    "MWF; [facility] in [place]" (01/06/2018)  . Gait abnormality 08/28/2016  . GERD (gastroesophageal reflux disease)   . Headache    "a few times/week" (10/30/2014)  . Headache syndrome 01/08/2017  . Heart murmur   . History of blood transfusion 1996   "related to menses"  . Hyperlipidemia   . Hypertension   . Hypothyroidism   . IBS (irritable bowel syndrome)   . Myocardial infarction ([place]) 2004  . Osteoarthritis   . Pneumonia    "years ago"  . Rotator cuff arthropathy of right shoulder   . Spondylosis of cervical spine 06/16/2017  . Stroke ([place]) 07/2016   mini stroke , right side of body weak-   . Type II diabetes mellitus ([place])     Social History Social History   Tobacco Use  . Smoking status: Former Smoker    Years: 3.00    Types: Cigarettes  . Smokeless tobacco: Never Used  . Tobacco comment: "stopped smoking in the 1970's"  Substance Use Topics  . Alcohol use: No  . Drug use: Not Currently    Types: Marijuana, Hydromorphone    Comment: as a teenager     Family History Family History  Problem Relation Age of Onset  . Epilepsy Mother   . Diabetes Mother   . Cancer Mother   . Alcohol abuse Father     Surgical History Past Surgical History:  Procedure Laterality Date  . APPENDECTOMY    . AV FISTULA PLACEMENT Left 12/03/2006   "forearm"  . AV FISTULA REPAIR Left 11/25/2009   "forearm"  . CARPAL TUNNEL RELEASE Right 02/11/2017   Procedure: RIGHT CARPAL TUNNEL RELEASE;  Surgeon: Daryll Brod, MD;  Location: [place];  Service: Orthopedics;  Laterality: Right;  . CHOLECYSTECTOMY OPEN  1970's  . COLONOSCOPY W/ POLYPECTOMY  07/30/2010   Minimal sigmoid diverticulosis. Small internal hemorrhoids. Otherwise normal colonoscopy to TI.   Marland Kitchen CORONARY ANGIOPLASTY    . CORONARY ANGIOPLASTY WITH STENT PLACEMENT    . ESOPHAGOGASTRODUODENOSCOPY  10/21/2012   Gastric polyps- status post polypectomy. Mild gastritis.   . INSERTION OF DIALYSIS CATHETER Right 2010   "chest"  . PARATHYROIDECTOMY  2010?   Parathyroid autotransplantation   . REVERSE SHOULDER ARTHROPLASTY Right 01/06/2018  . REVERSE SHOULDER ARTHROPLASTY Right 01/06/2018   Procedure: RIGHT REVERSE SHOULDER ARTHROPLASTY;  Surgeon: [name], MD;  Location: [place];  Service: Orthopedics;  Laterality: Right;  . REVISON OF ARTERIOVENOUS  FISTULA Left 12/04/2013   Procedure: REVISON OF ARTERIOVENOUS FISTULA;  Surgeon: Rosetta Posner, MD;  Location: [place];  Service: Vascular;  Laterality: Left;  . REVISON OF ARTERIOVENOUS FISTULA Left 02/08/8181   Procedure: PLICATION OF LEFT BRACHIOCEPHALIC  ARTERIOVENOUS FISTULA;  Surgeon: [place], MD;  Location: [place];  Service: Vascular;  Laterality: Left;  . SHOULDER ARTHROSCOPY WITH ROTATOR CUFF REPAIR AND SUBACROMIAL DECOMPRESSION Right 06/22/2017   Procedure: RIGHT SHOULDER ARTHROSCOPY WITH EXTENSIVE DEBRIDEMENT;  Surgeon: Mcarthur Rossetti, MD;  Location: [place];  Service: Orthopedics;  Laterality: Right;  . TRIGGER FINGER  RELEASE Right 10/22/2017   Procedure: RELEASE RIGHT INDEX TRIGGER FINGER/A-1 PULLEY;  Surgeon: Daryll Brod, MD;  Location: [place];  Service: Orthopedics;  Laterality: Right;  . TUBAL LIGATION  1989  . UMBILICAL HERNIA REPAIR  1990's X 2    Allergies  Allergen Reactions  . Sulfa Antibiotics Anaphylaxis  . Amoxicillin Hives    Has patient had a PCN reaction causing immediate rash, facial/tongue/throat swelling, SOB or lightheadedness with hypotension: No Has patient had a PCN reaction causing severe rash involving mucus membranes or skin necrosis: No Has patient had a PCN reaction that required hospitalization: No Has patient had a PCN reaction occurring within the last 10 years: Unknown If all of the above answers are "NO", then may proceed with Cephalosporin use.   Marland Kitchen Crestor [Rosuvastatin Calcium] Other (See Comments)    Dizziness, couldn't walk  . Ibuprofen Hives  . Naldecon Senior [employer]  . Statins Hives  . Chlorphen-Phenyleph-Asa Rash  . Gabapentin Other (See Comments)    Dizzy    Current Outpatient Medications  Medication Sig Dispense Refill  . acetaminophen (TYLENOL) 500 MG tablet Take 500-1,000 mg by mouth every 6 (six) hours as needed (FOR PAIN.).    Marland Kitchen aspirin EC 81 MG tablet Take 81 mg by mouth daily.     . B Complex-C-Zn-Folic Acid (DIALYVITE 993 WITH ZINC) 0.8 MG TABS Take 1 tablet by mouth daily.  12  . carvedilol (COREG) 12.5 MG tablet Take 25 mg by mouth at bedtime.   2  . clonazePAM (KLONOPIN) 0.5 MG tablet Take 1 tablet (0.5 mg total) by mouth at bedtime. 30 tablet 2  . dimenhyDRINATE (DRAMAMINE) 50 MG tablet Take 50 mg by mouth every 8 (eight) hours as needed (for dizziness/vertigo).    . ezetimibe (ZETIA) 10 MG tablet Take 10 mg by mouth daily.  1  . fenofibrate 160 MG tablet Take 160 mg by mouth daily.  1  . finasteride (PROSCAR) 5 MG tablet Take 2.5 mg by mouth daily with lunch.     . lanthanum (FOSRENOL) 1000 MG chewable tablet Chew 2,000 mg by  mouth See admin instructions. TAKE 2 TABLETS (2000 MG) BY MOUTH DAILY AFTER EACH MEALS & 1 TABLET (1000 MG) BY MOUTH WITH SNACKS.    Marland Kitchen levothyroxine (SYNTHROID, LEVOTHROID) 50 MCG tablet Take 50 mcg by mouth daily before breakfast.     . lidocaine-prilocaine (EMLA) cream Apply 1 application topically See admin instructions. Apply a small amount to access site 1-2 hours before dialysis--cover with occlusive dressing  12  . liraglutide (VICTOZA) 18 MG/3ML SOPN Inject 1.8 mg into the skin daily.     Marland Kitchen loperamide (IMODIUM A-D) 2 MG tablet Take 2 mg by mouth every Monday, Wednesday, and Friday with hemodialysis.     Marland Kitchen nortriptyline (PAMELOR) 10 MG capsule Take one capsule at night for one week, then take 2 capsules at night (Patient  taking differently: Take 10 mg by mouth at bedtime. ) 60 capsule 3  . omeprazole (PRILOSEC) 20 MG capsule Take 40 mg by mouth 2 (two) times daily before a meal.     . oxyCODONE (OXY IR/ROXICODONE) 5 MG immediate release tablet 1 po q 8-12hrs prn pain 40 tablet 0  . sitaGLIPtin (JANUVIA) 25 MG tablet Take 25 mg by mouth daily with lunch.     . Turmeric Curcumin 500 MG CAPS Take 500 mg by mouth daily with lunch.    . Vitamin D, Ergocalciferol, (DRISDOL) 50000 units CAPS capsule Take 50,000 Units by mouth every 30 (thirty) days.  2  . Zinc 50 MG CAPS Take 50 mg by mouth daily with lunch.      No current facility-administered medications for this visit.      REVIEW OF SYSTEMS: see HPI for pertinent positives and negatives    PHYSICAL EXAMINATION:  Vitals:   05/19/18 1318  BP: 112/70  Pulse: 70  Resp: 14  Temp: 98.2 F (36.8 C)  SpO2: 97%  Weight: 185 lb (83.9 kg)  Height: 4' 10.5" (1.486 m)   Body mass index is 38.01 kg/m.  General: The patient appears her stated age.  Obese, Seated in her w/c. Her son is present.  HEENT:  No gross abnormalities Pulmonary: Respirations are non-labored Abdomen: Soft and non-tender Musculoskeletal: There are no major  deformities.   Neurologic:  Right hand grip muscle strength is 3-4/5, left is 5/5.  Skin: There are no ulcer or rashes noted. Psychiatric: The patient has normal affect. Cardiovascular: There is a regular rate and rhythm. Somewhat prominent veins in left upper chest.  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 female who is s/p left brachiocephalic arteriovenous fistula plication (Date: 02/12[birth year]66) by Dr. Bridgett Larsson.   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   Dr. Oneida Alar spoke with and evaluated pt.  No steal sx's in left UE, + aneurysmal dilations at left UE AV fistula, somewhat prominent veins at left upper chest.  Will schedule fistula gram and central venogram on non HD day.    Clemon Chambers, RN, MSN, FNP-C [facility] Office: [phone]  05/19/2018, 1:42 PM  Clinic MD: Oneida Alar

## 2018-05-19 NOTE — Progress Notes (Signed)
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 ([birth year]) female who is s/p left brachiocephalic arteriovenous fistula plication (Date: 01/11/87) by Dr. Bridgett Larsson.   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  She dialyzes M-W-F.  She has been using this AVF since 2008.  Pt states there are no problems with HD. She denies steal sx's in left UE. She has had a stroke and has considerable right hemiparesis. She was right hand dominant.    Past Medical History:  Diagnosis Date  . Anemia   . Aortic stenosis   . CHF (congestive heart failure) ([place])   . Childhood asthma   . Chronic upper back pain   . Coronary artery disease    Stent to left main coronary artery second of August 2017  . Decreased hearing   . ESRD (end stage renal disease) on dialysis [facility])    "MWF; [facility] in [place]" (01/06/2018)  . Gait abnormality 08/28/2016  . GERD (gastroesophageal reflux disease)   . Headache    "a few times/week" (10/30/2014)  . Headache syndrome 01/08/2017  . Heart murmur   . History of blood transfusion 1996   "related to menses"  . Hyperlipidemia   . Hypertension   . Hypothyroidism   . IBS (irritable bowel syndrome)   . Myocardial infarction ([place]) 2004  . Osteoarthritis   . Pneumonia    "years ago"  . Rotator cuff arthropathy of right shoulder   . Spondylosis of cervical spine 06/16/2017  . Stroke ([place]) 07/2016   mini stroke , right side of body weak-   . Type II diabetes mellitus ([place])     Social History Social History   Tobacco Use  . Smoking status: Former Smoker    Years: 3.00    Types: Cigarettes  . Smokeless tobacco: Never Used  . Tobacco comment: "stopped smoking in the 1970's"  Substance Use Topics  . Alcohol use: No  . Drug use: Not Currently    Types: Marijuana, Hydromorphone    Comment: as a teenager     Family History Family History  Problem Relation Age of Onset  . Epilepsy Mother   . Diabetes Mother   . Cancer Mother   . Alcohol abuse Father     Surgical History Past Surgical History:  Procedure Laterality Date  . APPENDECTOMY    . AV FISTULA PLACEMENT Left 12/03/2006   "forearm"  . AV FISTULA REPAIR Left 11/25/2009   "forearm"  . CARPAL TUNNEL RELEASE Right 02/11/2017   Procedure: RIGHT CARPAL TUNNEL RELEASE;  Surgeon: Daryll Brod, MD;  Location: [facility];  Service: Orthopedics;  Laterality: Right;  . CHOLECYSTECTOMY OPEN  1970's  . COLONOSCOPY W/ POLYPECTOMY  07/30/2010   Minimal sigmoid diverticulosis. Small internal hemorrhoids. Otherwise normal colonoscopy to TI.   Marland Kitchen CORONARY ANGIOPLASTY    . CORONARY ANGIOPLASTY WITH STENT PLACEMENT    . ESOPHAGOGASTRODUODENOSCOPY  10/21/2012   Gastric polyps- status post polypectomy. Mild gastritis.   . INSERTION OF DIALYSIS CATHETER Right 2010   "chest"  . PARATHYROIDECTOMY  2010?   Parathyroid autotransplantation   . REVERSE SHOULDER ARTHROPLASTY Right 01/06/2018  . REVERSE SHOULDER ARTHROPLASTY Right 01/06/2018   Procedure: RIGHT REVERSE SHOULDER ARTHROPLASTY;  Surgeon: [name], MD;  Location: [facility];  Service: Orthopedics;  Laterality: Right;  . REVISON OF ARTERIOVENOUS  FISTULA Left 12/04/2013   Procedure: REVISON OF ARTERIOVENOUS FISTULA;  Surgeon: Rosetta Posner, MD;  Location: [facility];  Service: Vascular;  Laterality: Left;  . REVISON OF ARTERIOVENOUS FISTULA Left 09/10/3644   Procedure: PLICATION OF LEFT BRACHIOCEPHALIC  ARTERIOVENOUS FISTULA;  Surgeon: Conrad , MD;  Location: [facility];  Service: Vascular;  Laterality: Left;  . SHOULDER ARTHROSCOPY WITH ROTATOR CUFF REPAIR AND SUBACROMIAL DECOMPRESSION Right 06/22/2017   Procedure: RIGHT SHOULDER ARTHROSCOPY WITH EXTENSIVE DEBRIDEMENT;  Surgeon: Mcarthur Rossetti, MD;  Location: [facility];  Service: Orthopedics;  Laterality: Right;  . TRIGGER FINGER  RELEASE Right 10/22/2017   Procedure: RELEASE RIGHT INDEX TRIGGER FINGER/A-1 PULLEY;  Surgeon: Daryll Brod, MD;  Location: [facility];  Service: Orthopedics;  Laterality: Right;  . TUBAL LIGATION  1989  . UMBILICAL HERNIA REPAIR  1990's X 2    Allergies  Allergen Reactions  . Sulfa Antibiotics Anaphylaxis  . Amoxicillin Hives    Has patient had a PCN reaction causing immediate rash, facial/tongue/throat swelling, SOB or lightheadedness with hypotension: No Has patient had a PCN reaction causing severe rash involving mucus membranes or skin necrosis: No Has patient had a PCN reaction that required hospitalization: No Has patient had a PCN reaction occurring within the last 10 years: Unknown If all of the above answers are "NO", then may proceed with Cephalosporin use.   Marland Kitchen Crestor [Rosuvastatin Calcium] Other (See Comments)    Dizziness, couldn't walk  . Ibuprofen Hives  . Naldecon Senior [employer]  . Statins Hives  . Chlorphen-Phenyleph-Asa Rash  . Gabapentin Other (See Comments)    Dizzy    Current Outpatient Medications  Medication Sig Dispense Refill  . acetaminophen (TYLENOL) 500 MG tablet Take 500-1,000 mg by mouth every 6 (six) hours as needed (FOR PAIN.).    Marland Kitchen aspirin EC 81 MG tablet Take 81 mg by mouth daily.     . B Complex-C-Zn-Folic Acid (DIALYVITE 803 WITH ZINC) 0.8 MG TABS Take 1 tablet by mouth daily.  12  . carvedilol (COREG) 12.5 MG tablet Take 25 mg by mouth at bedtime.   2  . clonazePAM (KLONOPIN) 0.5 MG tablet Take 1 tablet (0.5 mg total) by mouth at bedtime. 30 tablet 2  . dimenhyDRINATE (DRAMAMINE) 50 MG tablet Take 50 mg by mouth every 8 (eight) hours as needed (for dizziness/vertigo).    . ezetimibe (ZETIA) 10 MG tablet Take 10 mg by mouth daily.  1  . fenofibrate 160 MG tablet Take 160 mg by mouth daily.  1  . finasteride (PROSCAR) 5 MG tablet Take 2.5 mg by mouth daily with lunch.     . lanthanum (FOSRENOL) 1000 MG chewable tablet Chew 2,000 mg by  mouth See admin instructions. TAKE 2 TABLETS (2000 MG) BY MOUTH DAILY AFTER EACH MEALS & 1 TABLET (1000 MG) BY MOUTH WITH SNACKS.    Marland Kitchen levothyroxine (SYNTHROID, LEVOTHROID) 50 MCG tablet Take 50 mcg by mouth daily before breakfast.     . lidocaine-prilocaine (EMLA) cream Apply 1 application topically See admin instructions. Apply a small amount to access site 1-2 hours before dialysis--cover with occlusive dressing  12  . liraglutide (VICTOZA) 18 MG/3ML SOPN Inject 1.8 mg into the skin daily.     Marland Kitchen loperamide (IMODIUM A-D) 2 MG tablet Take 2 mg by mouth every Monday, Wednesday, and Friday with hemodialysis.     Marland Kitchen nortriptyline (PAMELOR) 10 MG capsule Take one capsule at night for one week, then take 2 capsules at night (Patient  taking differently: Take 10 mg by mouth at bedtime. ) 60 capsule 3  . omeprazole (PRILOSEC) 20 MG capsule Take 40 mg by mouth 2 (two) times daily before a meal.     . oxyCODONE (OXY IR/ROXICODONE) 5 MG immediate release tablet 1 po q 8-12hrs prn pain 40 tablet 0  . sitaGLIPtin (JANUVIA) 25 MG tablet Take 25 mg by mouth daily with lunch.     . Turmeric Curcumin 500 MG CAPS Take 500 mg by mouth daily with lunch.    . Vitamin D, Ergocalciferol, (DRISDOL) 50000 units CAPS capsule Take 50,000 Units by mouth every 30 (thirty) days.  2  . Zinc 50 MG CAPS Take 50 mg by mouth daily with lunch.      No current facility-administered medications for this visit.      REVIEW OF SYSTEMS: see HPI for pertinent positives and negatives    PHYSICAL EXAMINATION:  Vitals:   05/19/18 1318  BP: 112/70  Pulse: 70  Resp: 14  Temp: 98.2 F (36.8 C)  SpO2: 97%  Weight: 185 lb (83.9 kg)  Height: 4' 10.5" (1.486 m)   Body mass index is 38.01 kg/m.  General: The patient appears her stated age.  Obese, Seated in her w/c. Her son is present.  HEENT:  No gross abnormalities Pulmonary: Respirations are non-labored Abdomen: Soft and non-tender Musculoskeletal: There are no major  deformities.   Neurologic:  Right hand grip muscle strength is 3-4/5, left is 5/5.  Skin: There are no ulcer or rashes noted. Psychiatric: The patient has normal affect. Cardiovascular: There is a regular rate and rhythm. Somewhat prominent veins in left upper chest.  Palpable thrill at left upper arm AVF  Left upper arm AVF with aneurysmal dilations     Medical Decision Making  ALIYAH ABEYTA is a 66 y.o. female who is s/p left brachiocephalic arteriovenous fistula plication (Date: 06/13/53) by Dr. Bridgett Larsson.   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   Dr. Oneida Alar spoke with and evaluated pt.  No steal sx's in left UE, + aneurysmal dilations at left UE AV fistula, somewhat prominent veins at left upper chest.  Will schedule fistula gram and central venogram on non HD day.    Roberta Chambers, RN, MSN, FNP-C [facility] Office: [phone]  05/19/2018, 1:42 PM  Clinic MD: Oneida Alar

## 2018-05-31 ENCOUNTER — Ambulatory Visit (HOSPITAL_COMMUNITY)
Admission: RE | Admit: 2018-05-31 | Discharge: 2018-05-31 | Disposition: A | Discharge status: Home / Self Care | Payer: Medicare Other | Attending: Surgery | Admitting: Surgery

## 2018-05-31 ENCOUNTER — Encounter (HOSPITAL_COMMUNITY)
Admission: RE | Disposition: A | Discharge status: Home / Self Care | Payer: Self-pay | Source: Home / Self Care | Attending: Surgery

## 2018-05-31 ENCOUNTER — Other Ambulatory Visit: Payer: Self-pay

## 2018-05-31 DIAGNOSIS — E785 Hyperlipidemia, unspecified: Secondary | ICD-10-CM | POA: Insufficient documentation

## 2018-05-31 DIAGNOSIS — Z882 Allergy status to sulfonamides status: Secondary | ICD-10-CM | POA: Diagnosis not present

## 2018-05-31 DIAGNOSIS — K219 Gastro-esophageal reflux disease without esophagitis: Secondary | ICD-10-CM | POA: Insufficient documentation

## 2018-05-31 DIAGNOSIS — Z79899 Other long term (current) drug therapy: Secondary | ICD-10-CM | POA: Diagnosis not present

## 2018-05-31 DIAGNOSIS — E039 Hypothyroidism, unspecified: Secondary | ICD-10-CM | POA: Insufficient documentation

## 2018-05-31 DIAGNOSIS — M1990 Unspecified osteoarthritis, unspecified site: Secondary | ICD-10-CM | POA: Insufficient documentation

## 2018-05-31 DIAGNOSIS — Z880 Allergy status to penicillin: Secondary | ICD-10-CM | POA: Insufficient documentation

## 2018-05-31 DIAGNOSIS — Z992 Dependence on renal dialysis: Secondary | ICD-10-CM | POA: Diagnosis not present

## 2018-05-31 DIAGNOSIS — Z9851 Tubal ligation status: Secondary | ICD-10-CM | POA: Diagnosis not present

## 2018-05-31 DIAGNOSIS — Z79890 Hormone replacement therapy (postmenopausal): Secondary | ICD-10-CM | POA: Diagnosis not present

## 2018-05-31 DIAGNOSIS — I132 Hypertensive heart and chronic kidney disease with heart failure and with stage 5 chronic kidney disease, or end stage renal disease: Secondary | ICD-10-CM | POA: Insufficient documentation

## 2018-05-31 DIAGNOSIS — I509 Heart failure, unspecified: Secondary | ICD-10-CM | POA: Insufficient documentation

## 2018-05-31 DIAGNOSIS — K589 Irritable bowel syndrome without diarrhea: Secondary | ICD-10-CM | POA: Diagnosis not present

## 2018-05-31 DIAGNOSIS — E1122 Type 2 diabetes mellitus with diabetic chronic kidney disease: Secondary | ICD-10-CM | POA: Diagnosis not present

## 2018-05-31 DIAGNOSIS — Z87891 Personal history of nicotine dependence: Secondary | ICD-10-CM | POA: Diagnosis not present

## 2018-05-31 DIAGNOSIS — I2510 Atherosclerotic heart disease of native coronary artery without angina pectoris: Secondary | ICD-10-CM | POA: Insufficient documentation

## 2018-05-31 DIAGNOSIS — Y832 Surgical operation with anastomosis, bypass or graft as the cause of abnormal reaction of the patient, or of later complication, without mention of misadventure at the time of the procedure: Secondary | ICD-10-CM | POA: Diagnosis not present

## 2018-05-31 DIAGNOSIS — Z8673 Personal history of transient ischemic attack (TIA), and cerebral infarction without residual deficits: Secondary | ICD-10-CM | POA: Insufficient documentation

## 2018-05-31 DIAGNOSIS — Z888 Allergy status to other drugs, medicaments and biological substances status: Secondary | ICD-10-CM | POA: Diagnosis not present

## 2018-05-31 DIAGNOSIS — I252 Old myocardial infarction: Secondary | ICD-10-CM | POA: Insufficient documentation

## 2018-05-31 DIAGNOSIS — N186 End stage renal disease: Secondary | ICD-10-CM | POA: Insufficient documentation

## 2018-05-31 DIAGNOSIS — Z833 Family history of diabetes mellitus: Secondary | ICD-10-CM | POA: Insufficient documentation

## 2018-05-31 DIAGNOSIS — Z886 Allergy status to analgesic agent status: Secondary | ICD-10-CM | POA: Diagnosis not present

## 2018-05-31 DIAGNOSIS — Z7982 Long term (current) use of aspirin: Secondary | ICD-10-CM | POA: Diagnosis not present

## 2018-05-31 DIAGNOSIS — Z955 Presence of coronary angioplasty implant and graft: Secondary | ICD-10-CM | POA: Insufficient documentation

## 2018-05-31 DIAGNOSIS — T82898A Other specified complication of vascular prosthetic devices, implants and grafts, initial encounter: Secondary | ICD-10-CM

## 2018-05-31 HISTORY — PX: A/V FISTULAGRAM: CATH118298

## 2018-05-31 LAB — POCT I-STAT, CHEM 8
BUN: 85 mg/dL — ABNORMAL HIGH (ref 8–23)
Calcium, Ion: 0.78 mmol/L — CL (ref 1.15–1.40)
Chloride: 97 mmol/L — ABNORMAL LOW (ref 98–111)
Creatinine, Ser: 10.1 mg/dL — ABNORMAL HIGH (ref 0.44–1.00)
GLUCOSE: 104 mg/dL — AB (ref 70–99)
HCT: 33 % — ABNORMAL LOW (ref 36.0–46.0)
Hemoglobin: 11.2 g/dL — ABNORMAL LOW (ref 12.0–15.0)
Potassium: 6.2 mmol/L — ABNORMAL HIGH (ref 3.5–5.1)
Sodium: 132 mmol/L — ABNORMAL LOW (ref 135–145)
TCO2: 26 mmol/L (ref 22–32)

## 2018-05-31 SURGERY — A/V FISTULAGRAM
Anesthesia: LOCAL

## 2018-05-31 MED ORDER — LIDOCAINE HCL (PF) 1 % IJ SOLN
INTRAMUSCULAR | Status: DC | PRN
  Administered 2018-05-31: 5 mL

## 2018-05-31 MED ORDER — HEPARIN (PORCINE) IN NACL 1000-0.9 UT/500ML-% IV SOLN
INTRAVENOUS | Status: DC | PRN
  Administered 2018-05-31: 500 mL

## 2018-05-31 MED ORDER — SODIUM CHLORIDE 0.9% FLUSH: 3.0000 mL | INTRAVENOUS | Status: DC | PRN

## 2018-05-31 MED ORDER — HEPARIN (PORCINE) IN NACL 1000-0.9 UT/500ML-% IV SOLN
INTRAVENOUS | Status: AC
  Filled 2018-05-31: qty 500

## 2018-05-31 MED ORDER — LIDOCAINE HCL (PF) 1 % IJ SOLN
INTRAMUSCULAR | Status: AC
  Filled 2018-05-31: qty 30

## 2018-05-31 MED ORDER — IODIXANOL 320 MG/ML IV SOLN
INTRAVENOUS | Status: DC | PRN
  Administered 2018-05-31: 40 mL via INTRAVENOUS

## 2018-05-31 SURGICAL SUPPLY — 8 items
BAG SNAP BAND KOVER 36X36 (MISCELLANEOUS) ×3 IMPLANT
COVER DOME SNAP 22 D (MISCELLANEOUS) ×3 IMPLANT
KIT MICROPUNCTURE NIT STIFF (SHEATH) ×3 IMPLANT
KIT PV (KITS) ×3 IMPLANT
SHEATH PROBE COVER 6X72 (BAG) ×3 IMPLANT
STOPCOCK MORSE 400PSI 3WAY (MISCELLANEOUS) ×3 IMPLANT
TRAY PV CATH (CUSTOM PROCEDURE TRAY) ×3 IMPLANT
TUBING CIL FLEX 10 FLL-RA (TUBING) ×3 IMPLANT

## 2018-05-31 NOTE — Discharge Instructions (Signed)

## 2018-05-31 NOTE — Op Note (Signed)
    Patient name: Roberta Bryant MRN: [id number] DOB: [birth year] Sex: female  05/31/2018 Pre-operative Diagnosis: ESRD Post-operative diagnosis:  Same Surgeon:  Annamarie Major Procedure Performed:  1.  Ultrasound-guided access, left arm fistula  2.  Fistulogram     Indications: The patient has aneurysmal degeneration of the fistula near the antecubital crease.  She comes in today for further evaluation.  Procedure:  The patient was identified in the holding area and taken to room 8.  The patient was then placed supine on the table and prepped and draped in the usual sterile fashion.  A time out was called.  Ultrasound was used to evaluate the fistula.  The vein was patent and compressible.  A digital ultrasound image was acquired.  The fistula was then accessed under ultrasound guidance using a micropuncture needle.  An 018 wire was then asvanced without resistance and a micropuncture sheath was placed.  Contrast injections were then performed through the sheath.  Findings: No central venous stenosis was visualized.  The arterial venous anastomosis was widely patent.  The fistula is widely patent.  There was luminal narrowing at the cephalic vein, deep vein junction however I did not feel this was hemodynamically significant because there was no hang-up of contrast and no significant collaterals were visualized.   Intervention: None  Impression:  #1  Widely patent left arm fistula with no evidence of central venous stenosis or anastomotic stenosis.   Theotis Burrow, M.D. [facility] Office: [phone] Pager:  [phone]

## 2018-05-31 NOTE — Interval H&P Note (Signed)
History and Physical Interval Note:  05/31/2018 7:33 AM  Roberta Bryant  has presented today for surgery, with the diagnosis of pvd  The various methods of treatment have been discussed with the patient and family. After consideration of risks, benefits and other options for treatment, the patient has consented to  Procedure(s): A/V FISTULAGRAM (Left) cental VENOGRAPHY (N/A) as a surgical intervention .  The patient's history has been reviewed, patient examined, no change in status, stable for surgery.  I have reviewed the patient's chart and labs.  Questions were answered to the patient's satisfaction.     Annamarie Major

## 2018-06-02 ENCOUNTER — Encounter (HOSPITAL_COMMUNITY): Payer: Self-pay | Admitting: Surgery

## 2018-07-08 ENCOUNTER — Encounter: Payer: Medicare Other | Admitting: Vascular Surgery

## 2018-07-19 ENCOUNTER — Other Ambulatory Visit: Payer: Self-pay

## 2018-07-19 ENCOUNTER — Encounter (HOSPITAL_COMMUNITY): Payer: Self-pay | Admitting: Emergency Medicine

## 2018-07-19 ENCOUNTER — Telehealth: Payer: Self-pay | Admitting: Neurology

## 2018-07-19 ENCOUNTER — Emergency Department (HOSPITAL_COMMUNITY)
Admission: EM | Admit: 2018-07-19 | Discharge: 2018-07-20 | Disposition: A | Discharge status: Home / Self Care | Payer: Medicare Other | Attending: Emergency Medicine | Admitting: Emergency Medicine

## 2018-07-19 ENCOUNTER — Ambulatory Visit: Payer: Medicare Other | Admitting: Cardiology

## 2018-07-19 DIAGNOSIS — Z5321 Procedure and treatment not carried out due to patient leaving prior to being seen by health care provider: Secondary | ICD-10-CM | POA: Diagnosis not present

## 2018-07-19 DIAGNOSIS — R1030 Lower abdominal pain, unspecified: Secondary | ICD-10-CM | POA: Diagnosis present

## 2018-07-19 DIAGNOSIS — R197 Diarrhea, unspecified: Secondary | ICD-10-CM | POA: Insufficient documentation

## 2018-07-19 LAB — CBC
HCT: 32.9 % — ABNORMAL LOW (ref 36.0–46.0)
Hemoglobin: 10.9 g/dL — ABNORMAL LOW (ref 12.0–15.0)
MCH: 38.2 pg — ABNORMAL HIGH (ref 26.0–34.0)
MCHC: 33.1 g/dL (ref 30.0–36.0)
MCV: 115.4 fL — ABNORMAL HIGH (ref 80.0–100.0)
PLATELETS: 209 10*3/uL (ref 150–400)
RBC: 2.85 MIL/uL — AB (ref 3.87–5.11)
RDW: 13.8 % (ref 11.5–15.5)
WBC: 6.9 10*3/uL (ref 4.0–10.5)
nRBC: 0 % (ref 0.0–0.2)

## 2018-07-19 MED ORDER — SODIUM CHLORIDE 0.9% FLUSH: 3.0000 mL | Freq: Once | INTRAVENOUS | Status: DC

## 2018-07-19 NOTE — Telephone Encounter (Signed)
I contacted the pt and she wanted to know if Dr. Jannifer Franklin had prescribed any preventive stroke meds. I advised he did not, but her medication list states she is to take aspirin 81 mg and Zetia 10 mg daily. I advised these were preventive meds and pt verbalized understanding.  Pt also states she has developed a new issue (left leg weakness/trouble walking). I advised the pt she is due for her f/u with Dr. Jannifer Franklin. I advised we could see her on 08/09/18 at 2 pm and a check in time of 1:30 pm.. Pt was agreeable. Pt advised we would address this new issue during o/v.

## 2018-07-19 NOTE — ED Triage Notes (Signed)
Patient with lower abdominal pain and diarrhea since Sunday evening.  She has a history of IBS.  Patient is a dialysis patient, does dialysis MWF.  Fistula in right arm.

## 2018-07-19 NOTE — Telephone Encounter (Signed)
Pt would like to know if any medication was ordered for her stroke. Please advise.

## 2018-07-20 LAB — COMPREHENSIVE METABOLIC PANEL
ALBUMIN: 3.2 g/dL — AB (ref 3.5–5.0)
ALT: 13 U/L (ref 0–44)
AST: 24 U/L (ref 15–41)
Alkaline Phosphatase: 96 U/L (ref 38–126)
Anion gap: 16 — ABNORMAL HIGH (ref 5–15)
BUN: 45 mg/dL — AB (ref 8–23)
CO2: 27 mmol/L (ref 22–32)
Calcium: 7.9 mg/dL — ABNORMAL LOW (ref 8.9–10.3)
Chloride: 97 mmol/L — ABNORMAL LOW (ref 98–111)
Creatinine, Ser: 7.21 mg/dL — ABNORMAL HIGH (ref 0.44–1.00)
GFR calc Af Amer: 6 mL/min — ABNORMAL LOW (ref >60)
GFR calc non Af Amer: 5 mL/min — ABNORMAL LOW (ref >60)
GLUCOSE: 121 mg/dL — AB (ref 70–99)
Potassium: 4.2 mmol/L (ref 3.5–5.1)
Sodium: 140 mmol/L (ref 135–145)
Total Bilirubin: 0.7 mg/dL (ref 0.3–1.2)
Total Protein: 6.8 g/dL (ref 6.5–8.1)

## 2018-07-20 LAB — LIPASE, BLOOD: Lipase: 43 U/L (ref 11–51)

## 2018-07-20 NOTE — ED Notes (Signed)
Called for room X3 with no answer 

## 2018-07-28 ENCOUNTER — Encounter: Payer: Self-pay | Admitting: Sports Medicine

## 2018-07-28 ENCOUNTER — Ambulatory Visit (INDEPENDENT_AMBULATORY_CARE_PROVIDER_SITE_OTHER): Payer: Medicare Other

## 2018-07-28 ENCOUNTER — Ambulatory Visit (INDEPENDENT_AMBULATORY_CARE_PROVIDER_SITE_OTHER): Payer: Medicare Other | Admitting: Sports Medicine

## 2018-07-28 VITALS — BP 107/71 | HR 73 | Resp 16 | Ht <= 58 in | Wt 181.2 lb

## 2018-07-28 DIAGNOSIS — S90122A Contusion of left lesser toe(s) without damage to nail, initial encounter: Secondary | ICD-10-CM

## 2018-07-28 DIAGNOSIS — E119 Type 2 diabetes mellitus without complications: Secondary | ICD-10-CM

## 2018-07-28 DIAGNOSIS — L601 Onycholysis: Secondary | ICD-10-CM

## 2018-07-28 MED ORDER — NEOMYCIN-POLYMYXIN-HC 3.5-10000-1 OT SOLN: OTIC | 0 refills | Status: DC

## 2018-07-28 NOTE — Progress Notes (Signed)
Subjective: Roberta Bryant is a 68 y.o. female patient with history of diabetes who presents to office today complaining of pain to left 3rd toenail. Reports that it has gotten worse over the last month and had to cover with bandaid which did not help; reports she left bandaid on for 3 days and then noticed some discoloration at the skin around the nail.  Patient does admit to a history of stubbing and bumping her foot and does not know she injured the toe.  Patient has been diabetic for 30+ years and reports that her last blood sugar was 153 on yesterday last A1c 4.7 and that she last saw her primary care doctor 4 to 5 months ago.  Patient is on dialysis and admits 3 out of 10 throbbing pain very tender with touch to the toe without any warmth redness swelling or drainage.  No other acute symptoms noted.  Review of Systems  All other systems reviewed and are negative.   Patient Active Problem List   Diagnosis Date Noted  . Shoulder arthritis 01/06/2018  . Rotator cuff arthropathy of right shoulder   . Status post arthroscopy of right shoulder 06/22/2017  . Spondylosis of cervical spine 06/16/2017  . Trigger index finger of right hand 06/11/2017  . Aortic stenosis 04/12/2017  . Coronary artery diseaseStatus post PTCA and stenting with drug-eluting stent to left main coronary artery in the summer 2017 04/12/2017  . 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 Decreased ROM of finger 03/01/2017  . Decreased ROM of wrist 03/01/2017  . Pain in finger of right hand 03/01/2017  . Headache syndrome 01/08/2017  . Right carpal tunnel syndrome 12/14/2016  . Complete tear of right rotator cuff 10/21/2016  . Gait abnormality 08/28/2016  . Nasal injury, initial encounter 07/01/2016  . Nasal septal deviation 07/01/2016  . Nasal turbinate hypertrophy 07/01/2016  . Abnormal stress test 01/01/2016  . Chest pain 11/23/2015  . ESRD on dialysis ([place])   . Type 2 diabetes mellitus with complication ([place])   . Essential  hypertension   . Dyslipidemia 12/24/2014  . Hemodialysis patient ([place]) 12/24/2014  . Abnormal ECG   . Pain in the chest   . Hypotension 10/30/2014  . Abnormal EKG 10/30/2014  . 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 Essential tremor 01/10/2014  . Parkinson's disease ([place]) 01/10/2014  . Migraine without aura 07/28/2013  . End stage renal disease ([place]) 07/18/2012  . Other complications due to renal dialysis device, implant, and graft 07/18/2012   Current Outpatient Medications on File Prior to Visit  Medication Sig Dispense Refill  . acetaminophen (TYLENOL) 500 MG tablet Take 500-1,000 mg by mouth every 6 (six) hours as needed (FOR PAIN.).    Marland Kitchen aspirin EC 81 MG tablet Take 81 mg by mouth daily.     . B Complex-C-Zn-Folic Acid (DIALYVITE 660 WITH ZINC) 0.8 MG TABS Take 1 tablet by mouth every Monday, Wednesday, and Friday. After dialysis  12  . carvedilol (COREG) 12.5 MG tablet Take 25 mg by mouth every evening.   2  . clonazePAM (KLONOPIN) 0.5 MG tablet Take 1 tablet (0.5 mg total) by mouth at bedtime. (Patient taking differently: Take 0.5 mg by mouth at bedtime as needed (sleep.). ) 30 tablet 2  . dimenhyDRINATE (DRAMAMINE) 50 MG tablet Take 50 mg by mouth every 8 (eight) hours as needed (for dizziness/vertigo).    . ezetimibe (ZETIA) 10 MG tablet Take 10 mg by mouth every evening.   1  . fenofibrate  160 MG tablet Take 160 mg by mouth every evening.   1  . finasteride (PROSCAR) 5 MG tablet Take 2.5 mg by mouth daily at 12 noon.     . folic acid-vitamin b complex-vitamin c-selenium-zinc (DIALYVITE) 3 MG TABS tablet Take 1 tablet by mouth daily.    Marland Kitchen lanthanum (FOSRENOL) 1000 MG chewable tablet Chew 2,000 mg by mouth See admin instructions. TAKE 2 TABLETS (2000 MG) BY MOUTH DAILY AFTER EACH MEALS    . levothyroxine (SYNTHROID, LEVOTHROID) 50 MCG tablet Take 50 mcg by mouth daily before breakfast.     . lidocaine-prilocaine (EMLA) cream Apply 1 application topically  See admin instructions. Apply a small amount to access site 1-2 hours before dialysis--cover with occlusive dressing  12  . Liraglutide -Weight Management (SAXENDA [place]) Inject into the skin.    Marland Kitchen loperamide (IMODIUM A-D) 2 MG tablet Take 2-4 mg by mouth every Monday, Wednesday, and Friday with hemodialysis.     Marland Kitchen nortriptyline (PAMELOR) 10 MG capsule Take one capsule at night for one week, then take 2 capsules at night (Patient taking differently: Take 10 mg by mouth at bedtime. ) 60 capsule 3  . omeprazole (PRILOSEC) 20 MG capsule Take 20 mg by mouth 2 (two) times daily.     . sitaGLIPtin (JANUVIA) 25 MG tablet Take 25 mg by mouth daily at 12 noon.     . Turmeric Curcumin 500 MG CAPS Take 500 mg by mouth daily at 12 noon.     . Zinc 50 MG CAPS Take 50 mg by mouth daily at 12 noon.      No current facility-administered medications on file prior to visit.    Allergies  Allergen Reactions  . Sulfa Antibiotics Anaphylaxis  . Amoxicillin Hives    Has patient had a PCN reaction causing immediate rash, facial/tongue/throat swelling, SOB or lightheadedness with hypotension: No Has patient had a PCN reaction causing severe rash involving mucus membranes or skin necrosis: No Has patient had a PCN reaction that required hospitalization: No Has patient had a PCN reaction occurring within the last 10 years: Unknown If all of the above answers are "NO", then may proceed with Cephalosporin use.   Marland Kitchen Crestor [Rosuvastatin Calcium] Other (See Comments)    Dizziness, couldn't walk  . Ibuprofen Hives  . Naldecon Senior [employer]  . Statins Hives  . Chlorphen-Phenyleph-Asa Rash  . Gabapentin Other (See Comments)    Dizzy    Recent Results (from the past 2160 hour(s))  I-STAT, chem 8     Status: Abnormal   Collection Time: 05/31/18  6:56 AM  Result Value Ref Range   Sodium 132 (L) 135 - 145 mmol/L   Potassium 6.2 (H) 3.5 - 5.1 mmol/L   Chloride 97 (L) 98 - 111 mmol/L   BUN 85 (H) 8 - 23  mg/dL   Creatinine, Ser 10.10 (H) 0.44 - 1.00 mg/dL   Glucose, Bld 104 (H) 70 - 99 mg/dL   Calcium, Ion 0.78 (LL) 1.15 - 1.40 mmol/L   TCO2 26 22 - 32 mmol/L   Hemoglobin 11.2 (L) 12.0 - 15.0 g/dL   HCT 33.0 (L) 36.0 - 46.0 %   Comment NOTIFIED PHYSICIAN   Lipase, blood     Status: None   Collection Time: 07/19/18 11:30 PM  Result Value Ref Range   Lipase 43 11 - 51 U/L    Comment: Performed at [facility], [address], [place], [place] [address]  Comprehensive metabolic panel  Status: Abnormal   Collection Time: 07/19/18 11:30 PM  Result Value Ref Range   Sodium 140 135 - 145 mmol/L   Potassium 4.2 3.5 - 5.1 mmol/L   Chloride 97 (L) 98 - 111 mmol/L   CO2 27 22 - 32 mmol/L   Glucose, Bld 121 (H) 70 - 99 mg/dL   BUN 45 (H) 8 - 23 mg/dL   Creatinine, Ser 7.21 (H) 0.44 - 1.00 mg/dL   Calcium 7.9 (L) 8.9 - 10.3 mg/dL   Total Protein 6.8 6.5 - 8.1 g/dL   Albumin 3.2 (L) 3.5 - 5.0 g/dL   AST 24 15 - 41 U/L   ALT 13 0 - 44 U/L   Alkaline Phosphatase 96 38 - 126 U/L   Total Bilirubin 0.7 0.3 - 1.2 mg/dL   GFR calc non Af Amer 5 (L) >60 mL/min   GFR calc Af Amer 6 (L) >60 mL/min   Anion gap 16 (H) 5 - 15    Comment: Performed at [facility], [place] [address], [place], [place] [address]  CBC     Status: Abnormal   Collection Time: 07/19/18 11:30 PM  Result Value Ref Range   WBC 6.9 4.0 - 10.5 K/uL   RBC 2.85 (L) 3.87 - 5.11 MIL/uL   Hemoglobin 10.9 (L) 12.0 - 15.0 g/dL   HCT 32.9 (L) 36.0 - 46.0 %   MCV 115.4 (H) 80.0 - 100.0 fL   MCH 38.2 (H) 26.0 - 34.0 pg   MCHC 33.1 30.0 - 36.0 g/dL   RDW 13.8 11.5 - 15.5 %   Platelets 209 150 - 400 K/uL   nRBC 0.0 0.0 - 0.2 %    Comment: Performed at [facility] [address], [place], [place] [address]    Objective: General: Patient is awake, alert, and oriented x 3 and in no acute distress.  Integument: Skin is warm, dry and supple bilateral. Nails are  thickened and  dystrophic with subungual debris,  consistent with onychomycosis, 1-5 bilateral with mild lifting noted at the left third toenail and proximal nail fold discoloration possible contusion versus hyperpigmentation. No acute signs of infection. No open lesions or preulcerative lesions present bilateral. Remaining integument unremarkable.  Vasculature:  Dorsalis Pedis pulse 1/4 bilateral. Posterior Tibial pulse  0/4 bilateral.  Audible on Doppler biphasic. Capillary fill time <3 sec 1-5 bilateral.  Decreased hair growth to the level of the digits. Temperature gradient within normal limits. No varicosities present bilateral.  Trace edema present bilateral.   Neurology: The patient has intact sensation measured with a 5.07/10g Semmes Weinstein Monofilament at all pedal sites bilateral . Vibratory sensation diminished bilateral with tuning fork. No Babinski sign present bilateral.   Musculoskeletal: There is pain with palpation to the left third toe.  Asymptomatic pes planus and hammertoe pedal deformities noted bilateral. Muscular strength 5/5 in all lower extremity muscular groups bilateral without pain on range of motion . No tenderness with calf compression bilateral.  X-rays left foot: Decreased osseous mineralization there is mild digital contracture consistent with hammertoe and midtarsal breech consistent with pes planus.  There is no fracture at the third toe at area of concern.  Mild soft tissue swelling.  No other acute findings.  Assessment and Plan: Problem List Items Addressed This Visit    None    Visit Diagnoses    Contusion of lesser toe of left foot without damage to nail, initial encounter    -  Primary  Relevant Orders   DG Foot Complete Left   Onycholysis of toenail       Diabetes mellitus without complication ([place])          -Examined patient. -X-rays reviewed -Discussed and educated patient on diabetic foot care, especially with  regards to the vascular, neurological and musculoskeletal systems.  -Stressed  the importance of good glycemic control and the detriment of not  controlling glucose levels in relation to the foot. -Mechanically debrided all nails left third toenail using sterile nail nipper and filed with dremel without incident  -Prescribed Corticosporin solution for patient to use at nail and at the proximal nail fold where there is pain on today's visit advised patient if fails to improve to return to office for possible nail procedure -Answered all patient questions -Patient to return as needed -Patient advised to call the office if any problems or questions arise in the meantime.  Landis Martins, DPM

## 2018-08-01 ENCOUNTER — Other Ambulatory Visit: Payer: Self-pay | Admitting: Neurology

## 2018-08-09 ENCOUNTER — Encounter: Payer: Self-pay | Admitting: Neurology

## 2018-08-09 ENCOUNTER — Ambulatory Visit (INDEPENDENT_AMBULATORY_CARE_PROVIDER_SITE_OTHER): Payer: Medicare Other | Admitting: Neurology

## 2018-08-09 VITALS — BP 116/62 | HR 64

## 2018-08-09 DIAGNOSIS — G250 Essential tremor: Secondary | ICD-10-CM

## 2018-08-09 DIAGNOSIS — R269 Unspecified abnormalities of gait and mobility: Secondary | ICD-10-CM | POA: Diagnosis not present

## 2018-08-09 DIAGNOSIS — G43009 Migraine without aura, not intractable, without status migrainosus: Secondary | ICD-10-CM | POA: Diagnosis not present

## 2018-08-09 NOTE — Progress Notes (Signed)
Reason for visit: Gait disorder  Roberta Bryant is an 68 y.o. female  History of present illness:  Roberta Bryant is a 68 year old right-handed Hispanic female with a history of degenerative arthritis affecting the left knee.  She also has some back pain and pain down the right leg to the knee.  This generally comes on with weightbearing.  The patient has a walker but it is too tall for her.  She has not had any recent physical therapy for her walking.  The patient has mainly been restricted to a wheelchair to get around, she does minimal ambulation at this point.  She returns to this office for an evaluation.  Past Medical History:  Diagnosis Date  . Anemia   . Aortic stenosis   . CHF (congestive heart failure) ([place])   . Childhood asthma   . Chronic upper back pain   . Coronary artery disease    Stent to left main coronary artery second of August 2017  . Decreased hearing   . ESRD (end stage renal disease) on dialysis [facility])    "MWF; [facility] in [place]" (01/06/2018)  . Gait abnormality 08/28/2016  . GERD (gastroesophageal reflux disease)   . Headache    "a few times/week" (10/30/2014)  . Headache syndrome 01/08/2017  . Heart murmur   . History of blood transfusion 1996   "related to menses"  . Hyperlipidemia   . Hypertension   . Hypothyroidism   . IBS (irritable bowel syndrome)   . Myocardial infarction ([place]) 2004  . Osteoarthritis   . Pneumonia    "years ago"  . Rotator cuff arthropathy of right shoulder   . Spondylosis of cervical spine 06/16/2017  . Stroke ([place]) 07/2016   mini stroke , right side of body weak-   . Type II diabetes mellitus ([place])     Past Surgical History:  Procedure Laterality Date  . A/V FISTULAGRAM Left 05/31/2018   Procedure: A/V FISTULAGRAM;  Surgeon: Serafina Mitchell, MD;  Location: [place] [facility];  Service: Cardiovascular;  Laterality: Left;  . APPENDECTOMY    . AV FISTULA PLACEMENT Left 12/03/2006   "forearm"  . AV FISTULA REPAIR  Left 11/25/2009   "forearm"  . CARPAL TUNNEL RELEASE Right 02/11/2017   Procedure: RIGHT CARPAL TUNNEL RELEASE;  Surgeon: Daryll Brod, MD;  Location: [place];  Service: Orthopedics;  Laterality: Right;  . CHOLECYSTECTOMY OPEN  1970's  . COLONOSCOPY W/ POLYPECTOMY  07/30/2010   Minimal sigmoid diverticulosis. Small internal hemorrhoids. Otherwise normal colonoscopy to TI.   Marland Kitchen CORONARY ANGIOPLASTY    . CORONARY ANGIOPLASTY WITH STENT PLACEMENT    . ESOPHAGOGASTRODUODENOSCOPY  10/21/2012   Gastric polyps- status post polypectomy. Mild gastritis.   . INSERTION OF DIALYSIS CATHETER Right 2010   "chest"  . PARATHYROIDECTOMY  2010?   Parathyroid autotransplantation   . REVERSE SHOULDER ARTHROPLASTY Right 01/06/2018  . REVERSE SHOULDER ARTHROPLASTY Right 01/06/2018   Procedure: RIGHT REVERSE SHOULDER ARTHROPLASTY;  Surgeon: [name], MD;  Location: [facility];  Service: Orthopedics;  Laterality: Right;  . REVISON OF ARTERIOVENOUS FISTULA Left 12/04/2013   Procedure: REVISON OF ARTERIOVENOUS FISTULA;  Surgeon: Rosetta Posner, MD;  Location: [facility];  Service: Vascular;  Laterality: Left;  . REVISON OF ARTERIOVENOUS FISTULA Left 7/0/9628   Procedure: PLICATION OF LEFT BRACHIOCEPHALIC  ARTERIOVENOUS FISTULA;  Surgeon: Conrad Frontier, MD;  Location: [place];  Service: Vascular;  Laterality: Left;  . SHOULDER ARTHROSCOPY WITH ROTATOR CUFF REPAIR AND  SUBACROMIAL DECOMPRESSION Right 06/22/2017   Procedure: RIGHT SHOULDER ARTHROSCOPY WITH EXTENSIVE DEBRIDEMENT;  Surgeon: Mcarthur Rossetti, MD;  Location: [place];  Service: Orthopedics;  Laterality: Right;  . TRIGGER FINGER RELEASE Right 10/22/2017   Procedure: RELEASE RIGHT INDEX TRIGGER FINGER/A-1 PULLEY;  Surgeon: Daryll Brod, MD;  Location: [facility] [place];  Service: Orthopedics;  Laterality: Right;  . TUBAL LIGATION  1989  . UMBILICAL HERNIA REPAIR  1990's X 2    Family History  Problem Relation Age of Onset  . Epilepsy Mother   . Diabetes  Mother   . Cancer Mother   . Alcohol abuse Father     Social history:  reports that she has quit smoking. Her smoking use included cigarettes. She quit after 3.00 years of use. She has never used smokeless tobacco. She reports previous drug use. Drugs: Marijuana and Hydromorphone. She reports that she does not drink alcohol.    Allergies  Allergen Reactions  . Sulfa Antibiotics Anaphylaxis  . Amoxicillin Hives    Has patient had a PCN reaction causing immediate rash, facial/tongue/throat swelling, SOB or lightheadedness with hypotension: No Has patient had a PCN reaction causing severe rash involving mucus membranes or skin necrosis: No Has patient had a PCN reaction that required hospitalization: No Has patient had a PCN reaction occurring within the last 10 years: Unknown If all of the above answers are "NO", then may proceed with Cephalosporin use.   Marland Kitchen Crestor [Rosuvastatin Calcium] Other (See Comments)    Dizziness, couldn't walk  . Ibuprofen Hives  . Naldecon Senior [employer]  . Statins Hives  . Chlorphen-Phenyleph-Asa Rash  . Gabapentin Other (See Comments)    Dizzy    Medications:  Prior to Admission medications   Medication Sig Start Date End Date Taking? Authorizing Provider  acetaminophen (TYLENOL) 500 MG tablet Take 500-1,000 mg by mouth every 6 (six) hours as needed (FOR PAIN.).    [provider]  aspirin EC 81 MG tablet Take 81 mg by mouth daily.     [provider]  B Complex-C-Zn-Folic Acid (DIALYVITE 322 WITH ZINC) 0.8 MG TABS Take 1 tablet by mouth every Monday, Wednesday, and Friday. After dialysis 10/01/17   [provider]  carvedilol (COREG) 12.5 MG tablet Take 25 mg by mouth every evening.  08/06/16   [provider]  clonazePAM (KLONOPIN) 0.5 MG tablet TAKE 1 TABLET BY MOUTH AT BEDTIME 08/01/18   Kathrynn Ducking, MD  dimenhyDRINATE (DRAMAMINE) 50 MG tablet Take 50 mg by mouth every 8 (eight) hours as needed (for  dizziness/vertigo).    [provider]  ezetimibe (ZETIA) 10 MG tablet Take 10 mg by mouth every evening.  05/10/16   [provider]  fenofibrate 160 MG tablet Take 160 mg by mouth every evening.  03/08/17   [provider]  finasteride (PROSCAR) 5 MG tablet Take 2.5 mg by mouth daily at 12 noon.     [provider]  folic acid-vitamin b complex-vitamin c-selenium-zinc (DIALYVITE) 3 MG TABS tablet Take 1 tablet by mouth daily.    [provider]  lanthanum (FOSRENOL) 1000 MG chewable tablet Chew 2,000 mg by mouth See admin instructions. TAKE 2 TABLETS (2000 MG) BY MOUTH DAILY AFTER EACH MEALS    [provider]  levothyroxine (SYNTHROID, LEVOTHROID) 50 MCG tablet Take 50 mcg by mouth daily before breakfast.     [provider]  lidocaine-prilocaine (EMLA) cream Apply 1 application topically See admin instructions. Apply a small amount to  access site 1-2 hours before dialysis--cover with occlusive dressing 09/24/17   [provider]  Liraglutide -Weight Management (SAXENDA ) Inject into the skin.    [provider]  loperamide (IMODIUM A-D) 2 MG tablet Take 2-4 mg by mouth every Monday, Wednesday, and Friday with hemodialysis.     [provider]  neomycin-polymyxin-hydrocortisone (CORTISPORIN) OTIC solution 2 drops to toe twice a day until all gone 07/28/18   Landis Martins, DPM  nortriptyline (PAMELOR) 10 MG capsule Take one capsule at night for one week, then take 2 capsules at night Patient taking differently: Take 10 mg by mouth at bedtime.  09/14/17   Kathrynn Ducking, MD  omeprazole (PRILOSEC) 20 MG capsule Take 20 mg by mouth 2 (two) times daily.     [provider]  sitaGLIPtin (JANUVIA) 25 MG tablet Take 25 mg by mouth daily at 12 noon.     [provider]  Turmeric Curcumin 500 MG CAPS Take 500 mg by mouth daily at 12 noon.     [provider]  Zinc 50 MG CAPS Take 50 mg by  mouth daily at 12 noon.     [provider]    ROS:  Out of a complete 14 system review of symptoms, the patient complains only of the following symptoms, and all other reviewed systems are negative.  Walking difficulty Knee pain   Blood pressure 116/62, pulse 64.  Physical Exam  General: The patient is alert and cooperative at the time of the examination.  The patient is markedly obese.  Skin: No significant peripheral edema is noted.   Neurologic Exam  Mental status: The patient is alert and oriented x 3 at the time of the examination. The patient has apparent normal recent and remote memory, with an apparently normal attention span and concentration ability.   Cranial nerves: Facial symmetry is present. Speech is normal, no aphasia or dysarthria is noted. Extraocular movements are full. Visual fields are full.  Motor: The patient has good strength in all 4 extremities.  Sensory examination: Soft touch sensation is symmetric on the face, arms, and legs.  Coordination: The patient has good finger-nose-finger and heel-to-shin bilaterally.  Gait and station: The patient is able to stand with some minimal assistance.  Once up, she can walk with assistance, she has a limping gait on the left leg.  Romberg is negative.  Tandem gait was not attempted.  Reflexes: Deep tendon reflexes are symmetric.   Assessment/Plan:  1.  Gait disturbance  2.  Left knee arthritis  The patient will be set up for home health physical therapy.  She will be given a prescription for a walker that is an appropriate size for her.  She will follow-up here in about 6 months.  Jill Alexanders MD 08/09/2018 2:11 PM  [facility] [address] [place], [place] [address]  Phone [phone] Fax [phone]

## 2018-08-19 ENCOUNTER — Other Ambulatory Visit: Payer: Self-pay | Admitting: Neurology

## 2018-08-23 ENCOUNTER — Telehealth: Payer: Self-pay | Admitting: Neurology

## 2018-08-23 NOTE — Telephone Encounter (Signed)
Noted  

## 2018-08-23 NOTE — Telephone Encounter (Signed)
PT Shaun @ [facility] has called to inform he called pt this morning to inform he was on the way for PT. Pt informed him that she has a runny nose, dry heaving and vomiting.  PT Alverda Skeans is not going to pt's home today.  He can be reached at [phone]

## 2018-09-03 ENCOUNTER — Other Ambulatory Visit: Payer: Self-pay | Admitting: Neurology

## 2018-09-14 ENCOUNTER — Other Ambulatory Visit: Payer: Self-pay | Admitting: Neurology

## 2018-09-21 ENCOUNTER — Telehealth: Payer: Self-pay | Admitting: Neurology

## 2018-09-21 NOTE — Telephone Encounter (Signed)
[place] Database Verified LR: 08/01/2018 Qty: 30 Pending appointment: 11/11/2018

## 2018-09-26 NOTE — Telephone Encounter (Signed)
The patient is requesting a refill on her clonazepam, this was sent in on 05 September 2018 with 2 refills.  The prescription went to [facility].  I called and left a message for the patient in this regard.

## 2018-09-26 NOTE — Telephone Encounter (Signed)
Pt son(on DPR) has called to inform that the pharmacy re: the clonazePAM (KLONOPIN) 0.5 MG tablet states they never received the script from 03-30.  Pt is in need of the refill.  Pt son is asking for a call to discuss

## 2018-11-06 ENCOUNTER — Other Ambulatory Visit: Payer: Self-pay | Admitting: Neurology

## 2018-11-11 ENCOUNTER — Ambulatory Visit: Payer: Medicare Other | Admitting: Neurology

## 2018-11-21 ENCOUNTER — Telehealth: Payer: Self-pay | Admitting: *Deleted

## 2018-11-21 NOTE — Telephone Encounter (Signed)
Due to current COVID 19 pandemic, our office is severely reducing in office visits until further notice, in order to minimize the risk to our patients and healthcare providers. Unable to get in contact with the patient to convert their office visit and Judson Roch on 11/22/2018 into a doxy.me visit. I left a voicemail asking the patient to return my call. Office number was provided.     If patient calls back please convert their office visit into a doxy.me visit.

## 2018-11-22 ENCOUNTER — Ambulatory Visit: Payer: Medicare Other | Admitting: Neurology

## 2018-11-24 ENCOUNTER — Ambulatory Visit: Payer: Medicare Other | Admitting: Sports Medicine

## 2018-12-06 ENCOUNTER — Other Ambulatory Visit: Payer: Self-pay

## 2018-12-06 ENCOUNTER — Telehealth: Payer: Medicare Other | Admitting: Gastroenterology

## 2018-12-06 NOTE — Progress Notes (Signed)
No show

## 2018-12-15 ENCOUNTER — Encounter: Payer: Self-pay | Admitting: Sports Medicine

## 2018-12-15 ENCOUNTER — Ambulatory Visit (INDEPENDENT_AMBULATORY_CARE_PROVIDER_SITE_OTHER): Payer: Medicare Other | Admitting: Sports Medicine

## 2018-12-15 ENCOUNTER — Other Ambulatory Visit: Payer: Self-pay

## 2018-12-15 VITALS — Temp 97.5°F | Resp 16

## 2018-12-15 DIAGNOSIS — L601 Onycholysis: Secondary | ICD-10-CM

## 2018-12-15 DIAGNOSIS — L602 Onychogryphosis: Secondary | ICD-10-CM | POA: Diagnosis not present

## 2018-12-15 DIAGNOSIS — N186 End stage renal disease: Secondary | ICD-10-CM

## 2018-12-15 DIAGNOSIS — E119 Type 2 diabetes mellitus without complications: Secondary | ICD-10-CM

## 2018-12-15 MED ORDER — NEOMYCIN-POLYMYXIN-HC 3.5-10000-1 OT SOLN: OTIC | 0 refills | Status: DC

## 2018-12-15 NOTE — Progress Notes (Signed)
Subjective: Roberta Bryant is a 68 y.o. female patient presents to office today complaining of a moderately painful thickened left third toenail states that is still very sore about 2 weeks ago it started to bother her again with 10 out of 10 constant pain.  Patient has been using peroxide to the toe because she ran out of her Corticosporin drops and reports that it was helping but she ran out of them and states being out of the drops her toe has become increasingly painful.  Patient denies fever/chills/nausea/vomitting/any other related constitutional symptoms at this time.  Patient is assisted by son at this visit.  Fasting blood sugar 2 days ago was 132 Last A1c 4  Patient Active Problem List   Diagnosis Date Noted  . Shoulder arthritis 01/06/2018  . Rotator cuff arthropathy of right shoulder   . Status post arthroscopy of right shoulder 06/22/2017  . Spondylosis of cervical spine 06/16/2017  . Trigger index finger of right hand 06/11/2017  . Aortic stenosis 04/12/2017  . Coronary artery diseaseStatus post PTCA and stenting with drug-eluting stent to left main coronary artery in the summer 2017 04/12/2017  . 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 Decreased ROM of finger 03/01/2017  . Decreased ROM of wrist 03/01/2017  . Pain in finger of right hand 03/01/2017  . Headache syndrome 01/08/2017  . Right carpal tunnel syndrome 12/14/2016  . Complete tear of right rotator cuff 10/21/2016  . Gait abnormality 08/28/2016  . Nasal injury, initial encounter 07/01/2016  . Nasal septal deviation 07/01/2016  . Nasal turbinate hypertrophy 07/01/2016  . Abnormal stress test 01/01/2016  . Chest pain 11/23/2015  . ESRD on dialysis ([place])   . Type 2 diabetes mellitus with complication ([place])   . Essential hypertension   . Dyslipidemia 12/24/2014  . Hemodialysis patient ([place]) 12/24/2014  . Abnormal ECG   . Pain in the chest   . Hypotension 10/30/2014  . Abnormal EKG 10/30/2014  . 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 Essential tremor 01/10/2014  . Parkinson's disease ([place]) 01/10/2014  . Migraine without aura 07/28/2013  . End stage renal disease ([place]) 07/18/2012  . Other complications due to renal dialysis device, implant, and graft 07/18/2012    Current Outpatient Medications on File Prior to Visit  Medication Sig Dispense Refill  . acetaminophen (TYLENOL) 500 MG tablet Take 500-1,000 mg by mouth every 6 (six) hours as needed (FOR PAIN.).    Marland Kitchen aspirin EC 81 MG tablet Take 81 mg by mouth daily.     . B Complex-C-Zn-Folic Acid (DIALYVITE 161 WITH ZINC) 0.8 MG TABS Take 1 tablet by mouth every Monday, Wednesday, and Friday. After dialysis  12  . carvedilol (COREG) 12.5 MG tablet Take 25 mg by mouth every evening.   2  . clonazePAM (KLONOPIN) 0.5 MG tablet TAKE 1 TABLET BY MOUTH AT BEDTIME 30 tablet 2  . dimenhyDRINATE (DRAMAMINE) 50 MG tablet Take 50 mg by mouth every 8 (eight) hours as needed (for dizziness/vertigo).    Marland Kitchen escitalopram (LEXAPRO) 10 MG tablet TAKE 1 TABLET BY MOUTH ONCE DAILY FOR 30 DAYS    . ezetimibe (ZETIA) 10 MG tablet Take 10 mg by mouth every evening.   1  . fenofibrate 160 MG tablet Take 160 mg by mouth every evening.   1  . finasteride (PROSCAR) 5 MG tablet Take 2.5 mg by mouth daily at 12 noon.     . folic acid-vitamin b complex-vitamin c-selenium-zinc (DIALYVITE) 3 MG TABS tablet Take  1 tablet by mouth daily.    Marland Kitchen lanthanum (FOSRENOL) 1000 MG chewable tablet Chew 2,000 mg by mouth See admin instructions. TAKE 2 TABLETS (2000 MG) BY MOUTH DAILY AFTER EACH MEALS    . levothyroxine (SYNTHROID, LEVOTHROID) 50 MCG tablet Take 50 mcg by mouth daily before breakfast.     . lidocaine-prilocaine (EMLA) cream Apply 1 application topically See admin instructions. Apply a small amount to access site 1-2 hours before dialysis--cover with occlusive dressing  12  . Liraglutide -Weight Management (SAXENDA [place]) Inject into the skin.    Marland Kitchen loperamide (IMODIUM A-D)  2 MG tablet Take 2-4 mg by mouth every Monday, Wednesday, and Friday with hemodialysis.     Marland Kitchen nortriptyline (PAMELOR) 10 MG capsule Take 2 capsules (20 mg total) by mouth at bedtime. 180 capsule 1  . omeprazole (PRILOSEC) 20 MG capsule Take 20 mg by mouth 2 (two) times daily.     Marland Kitchen oxyCODONE-acetaminophen (PERCOCET/ROXICET) 5-325 MG tablet TAKE 1 TABLET BY MOUTH EVERY 4 HOURS AS NEEDED FOR DENTAL PAIN. DO NOT EXCEED 8 TABLETS IN 1 DAY    . sitaGLIPtin (JANUVIA) 25 MG tablet Take 25 mg by mouth daily at 12 noon.     . Turmeric Curcumin 500 MG CAPS Take 500 mg by mouth daily at 12 noon.     Marland Kitchen VICTOZA 18 MG/3ML SOPN     . Zinc 50 MG CAPS Take 50 mg by mouth daily at 12 noon.      No current facility-administered medications on file prior to visit.     Allergies  Allergen Reactions  . Sulfa Antibiotics Anaphylaxis  . Amoxicillin Hives    Has patient had a PCN reaction causing immediate rash, facial/tongue/throat swelling, SOB or lightheadedness with hypotension: No Has patient had a PCN reaction causing severe rash involving mucus membranes or skin necrosis: No Has patient had a PCN reaction that required hospitalization: No Has patient had a PCN reaction occurring within the last 10 years: Unknown If all of the above answers are "NO", then may proceed with Cephalosporin use.   Marland Kitchen Crestor [Rosuvastatin Calcium] Other (See Comments)    Dizziness, couldn't walk  . Ibuprofen Hives  . Naldecon Senior [employer]  . Statins Hives  . Chlorphen-Phenyleph-Asa Rash  . Gabapentin Other (See Comments)    Dizzy    Objective:  There were no vitals filed for this visit.  General: Well developed, nourished, in no acute distress, alert and oriented x3   Dermatology: Skin is warm, dry and supple bilateral.  Left third toenail appears to be severely thickened with hyperkeratosis formation at the proximal nail fold. (-) Erythema. (+) Edema. (-) serosanguous drainage present. The remaining nails  appear unremarkable at this time free from acute symptoms. There are no open sores, lesions or other signs of infection present.  Vascular: Dorsalis Pedis artery 1/4 and Posterior Tibial artery pedal pulses are 0/4 bilateral due to edema at ankles with 3-second capillary fill time.  Scant pedal hair growth present.  Neruologic: Grossly intact via light touch bilateral.  Hypersensitive in some areas however other areas she is hypersensitive in.  Musculoskeletal: Tenderness to palpation of the left third toe nail fold(s).  With shooting pains through the toe.  Asymptomatic pes planus and hammertoe pedal deformities noted.  No tenderness to palpation to calf.  Muscle strength acceptable for patient status.  Assesement and Plan: Problem List Items Addressed This Visit      Genitourinary   End stage renal disease ([place])  Other Visit Diagnoses    Onychogryposis of toenail    -  Primary   Left third toenail   Onycholysis of toenail       Diabetes mellitus without complication ([place])       Relevant Medications   VICTOZA 18 MG/3ML SOPN      -Discussed treatment alternatives and plan of care; Explained permanent/temporary nail avulsion and post procedure course to patient. Patient elects for permanent avulsion of left third toenail. - After a verbal and written consent, injected 3 ml of a 50:50 mixture of 2% plain lidocaine and 0.5% plain marcaine in a normal digital block fashion. Next, a betadine prep was performed. Anesthesia was tested and found to be appropriate.  The offending left third toenail was completely incised from the hyponychium to the epinychium. The offending nail was completely removed and cleared from the field. The area was curretted for any remaining nail or spicules. Phenol application performed and the area was then flushed with alcohol and dressed with antibiotic cream and a dry sterile dressing. -Patient was instructed to leave the dressing intact for today and begin  soaking in a weak solution of betadine or Epsom salt and water tomorrow. Patient was instructed to soak for 15-20 minutes each day and apply corticosporin and a gauze or bandaid dressing each day. -Patient was instructed to monitor the toe for signs of infection and return to office if toe becomes red, hot or swollen. -Advised ice, elevation, and tylenol or motrin if needed for pain.  -Patient is to return in 2 weeks for follow up care/nail check or sooner if problems arise.  Landis Martins, DPM

## 2018-12-15 NOTE — Patient Instructions (Signed)

## 2018-12-23 ENCOUNTER — Encounter: Payer: Self-pay | Admitting: Orthopedic Surgery

## 2018-12-23 ENCOUNTER — Ambulatory Visit: Payer: Self-pay

## 2018-12-23 ENCOUNTER — Ambulatory Visit (INDEPENDENT_AMBULATORY_CARE_PROVIDER_SITE_OTHER): Payer: Medicare Other | Admitting: Orthopedic Surgery

## 2018-12-23 ENCOUNTER — Ambulatory Visit (INDEPENDENT_AMBULATORY_CARE_PROVIDER_SITE_OTHER): Payer: Medicare Other

## 2018-12-23 ENCOUNTER — Other Ambulatory Visit: Payer: Self-pay

## 2018-12-23 DIAGNOSIS — M170 Bilateral primary osteoarthritis of knee: Secondary | ICD-10-CM

## 2018-12-23 DIAGNOSIS — M25562 Pain in left knee: Secondary | ICD-10-CM

## 2018-12-23 DIAGNOSIS — M25561 Pain in right knee: Secondary | ICD-10-CM

## 2018-12-23 MED ORDER — LIDOCAINE HCL 1 % IJ SOLN
5.0000 mL | INTRAMUSCULAR | Status: AC | PRN
  Administered 2018-12-23: 5 mL

## 2018-12-23 MED ORDER — BUPIVACAINE HCL 0.25 % IJ SOLN
4.0000 mL | INTRAMUSCULAR | Status: AC | PRN
  Administered 2018-12-23: 4 mL via INTRA_ARTICULAR

## 2018-12-23 MED ORDER — METHYLPREDNISOLONE ACETATE 40 MG/ML IJ SUSP
40.0000 mg | INTRAMUSCULAR | Status: AC | PRN
  Administered 2018-12-23: 40 mg via INTRA_ARTICULAR

## 2018-12-23 NOTE — Progress Notes (Addendum)
Office Visit Note   Patient: Roberta Bryant           Date of Birth: Oct 22, 1950           MRN: [id number] Visit Date: 12/23/2018 Requested by: Irven Shelling, MD [address] [place],  [place] [address] PCP: Irven Shelling, MD  Subjective: Chief Complaint  Patient presents with  . Right Knee - Pain  . Left Knee - Pain    HPI: Roberta Bryant is a patient with bilateral knee pain right worse than left.  Denies any history of injury.  She has pretty limited walking endurance due to the knee pain.  She reports weakness and some "" locking.".  At times she has a lot of pain when she walks primarily on the medial side.  This is a stabbing type pain.  Her shoulder is doing well.  Heat helps her knee.              ROS: All systems reviewed are negative as they relate to the chief complaint within the history of present illness.  Patient denies  fevers or chills.   Assessment & Plan: Visit Diagnoses:  1. Pain in both knees, unspecified chronicity     Plan: Impression is bilateral severe end-stage knee arthritis in a patient who appears to be somewhat deconditioned.  She did do reasonably well with her shoulder replacement.  Would like to try cortisone injection into the right knee.  If that helps we could do it again in the left knee in about a week.  I also want to get her preapproved for some gel injections.  She is not too keen on the idea of knee replacement surgery.  To that end we will plan on an attempt at conservative management before any further operative management  This patient is diagnosed with osteoarthritis of the knee(s).    Radiographs show evidence of joint space narrowing, osteophytes, subchondral sclerosis and/or subchondral cysts.  This patient has knee pain which interferes with functional and activities of daily living.    This patient has experienced inadequate response, adverse effects and/or intolerance with conservative treatments such as acetaminophen, NSAIDS,  topical creams, physical therapy or regular exercise, knee bracing and/or weight loss.   This patient has experienced inadequate response or has a contraindication to intra articular steroid injections for at least 3 months.   This patient is not scheduled to have a total knee replacement within 6 months of starting treatment with viscosupplementation.   Follow-Up Instructions: Return if symptoms worsen or fail to improve.   Orders:  Orders Placed This Encounter  Procedures  . XR KNEE 3 VIEW RIGHT  . XR KNEE 3 VIEW LEFT   No orders of the defined types were placed in this encounter.     Procedures: Large Joint Inj: R knee on 12/23/2018 8:04 PM Indications: diagnostic evaluation, joint swelling and pain Details: 18 G 1.5 in needle, superolateral approach  Arthrogram: No  Medications: 5 mL lidocaine 1 %; 40 mg methylPREDNISolone acetate 40 MG/ML; 4 mL bupivacaine 0.25 % Outcome: tolerated well, no immediate complications Procedure, treatment alternatives, risks and benefits explained, specific risks discussed. Consent was given by the patient. Immediately prior to procedure a time out was called to verify the correct patient, procedure, equipment, support staff and site/side marked as required. Patient was prepped and draped in the usual sterile fashion.       Clinical Data: No additional findings.  Objective: Vital Signs: There were no vitals taken  for this visit.  Physical Exam:   Constitutional: Patient appears well-developed HEENT:  Head: Normocephalic Eyes:EOM are normal Neck: Normal range of motion Cardiovascular: Normal rate Pulmonary/chest: Effort normal Neurologic: Patient is alert Skin: Skin is warm Psychiatric: Patient has normal mood and affect    Ortho Exam: Ortho exam demonstrates varus alignment bilateral lower extremities with palpable pedal pulses.  She has about a 5 to 10 degree flexion contracture bilaterally with mild effusion in both knees.   Extensor mechanism is intact.  No groin pain with internal X rotation of the leg.  She does have flexion past 90 degrees in both knees with stable collateral cruciate ligaments.  No skin changes lymphadenopathy or masses noted in either knee region.  Specialty Comments:  No specialty comments available.  Imaging: Xr Knee 3 View Left  Result Date: 12/23/2018 AP lateral merchant left knee reviewed.  Tricompartmental degenerative changes noted worse in the medial compartment.  No acute fracture or dislocation present.  Varus alignment noted.  Xr Knee 3 View Right  Result Date: 12/23/2018 AP lateral merchant right knee reviewed.  Tricompartmental osteoarthritis is present worse in the medial compartment.  Varus alignment is noted.  No acute fracture or dislocation.    PMFS History: Patient Active Problem List   Diagnosis Date Noted  . Shoulder arthritis 01/06/2018  . Rotator cuff arthropathy of right shoulder   . Status post arthroscopy of right shoulder 06/22/2017  . Spondylosis of cervical spine 06/16/2017  . Trigger index finger of right hand 06/11/2017  . Aortic stenosis 04/12/2017  . Coronary artery diseaseStatus post PTCA and stenting with drug-eluting stent to left main coronary artery in the summer 2017 04/12/2017  . 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 Decreased ROM of finger 03/01/2017  . Decreased ROM of wrist 03/01/2017  . Pain in finger of right hand 03/01/2017  . Headache syndrome 01/08/2017  . Right carpal tunnel syndrome 12/14/2016  . Complete tear of right rotator cuff 10/21/2016  . Gait abnormality 08/28/2016  . Nasal injury, initial encounter 07/01/2016  . Nasal septal deviation 07/01/2016  . Nasal turbinate hypertrophy 07/01/2016  . Abnormal stress test 01/01/2016  . Chest pain 11/23/2015  . ESRD on dialysis ([place])   . Type 2 diabetes mellitus with complication ([place])   . Essential hypertension   . Dyslipidemia 12/24/2014  . Hemodialysis patient ([place]) 12/24/2014  .  Abnormal ECG   . Pain in the chest   . Hypotension 10/30/2014  . Abnormal EKG 10/30/2014  . 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 Essential tremor 01/10/2014  . Parkinson's disease ([place]) 01/10/2014  . Migraine without aura 07/28/2013  . End stage renal disease ([place]) 07/18/2012  . Other complications due to renal dialysis device, implant, and graft 07/18/2012   Past Medical History:  Diagnosis Date  . Anemia   . Aortic stenosis   . CHF (congestive heart failure) ([place])   . Childhood asthma   . Chronic upper back pain   . Coronary artery disease    Stent to left main coronary artery second of August 2017  . Decreased hearing   . ESRD (end stage renal disease) on dialysis [facility])    "MWF; [facility] in [place]" (01/06/2018)  . Gait abnormality 08/28/2016  . GERD (gastroesophageal reflux disease)   . Headache    "a few times/week" (10/30/2014)  . Headache syndrome 01/08/2017  . Heart murmur   . History of blood transfusion 1996   "related to menses"  . Hyperlipidemia   .  Hypertension   . Hypothyroidism   . IBS (irritable bowel syndrome)   . Myocardial infarction ([place]) 2004  . Osteoarthritis   . Pneumonia    "years ago"  . Rotator cuff arthropathy of right shoulder   . Spondylosis of cervical spine 06/16/2017  . Stroke ([place]) 07/2016   mini stroke , right side of body weak-   . Type II diabetes mellitus (HCC)     Family History  Problem Relation Age of Onset  . Epilepsy Mother   . Diabetes Mother   . Cancer Mother   . Alcohol abuse Father     Past Surgical History:  Procedure Laterality Date  . A/V FISTULAGRAM Left 05/31/2018   Procedure: A/V FISTULAGRAM;  Surgeon: Serafina Mitchell, MD;  Location: [facility];  Service: Cardiovascular;  Laterality: Left;  . APPENDECTOMY    . AV FISTULA PLACEMENT Left 12/03/2006   "forearm"  . AV FISTULA REPAIR Left 11/25/2009   "forearm"  . CARPAL TUNNEL RELEASE Right 02/11/2017   Procedure: RIGHT CARPAL  TUNNEL RELEASE;  Surgeon: Daryll Brod, MD;  Location: [facility];  Service: Orthopedics;  Laterality: Right;  . CHOLECYSTECTOMY OPEN  1970's  . COLONOSCOPY W/ POLYPECTOMY  07/30/2010   Minimal sigmoid diverticulosis. Small internal hemorrhoids. Otherwise normal colonoscopy to TI.   Marland Kitchen CORONARY ANGIOPLASTY    . CORONARY ANGIOPLASTY WITH STENT PLACEMENT    . ESOPHAGOGASTRODUODENOSCOPY  10/21/2012   Gastric polyps- status post polypectomy. Mild gastritis.   . INSERTION OF DIALYSIS CATHETER Right 2010   "chest"  . PARATHYROIDECTOMY  2010?   Parathyroid autotransplantation   . REVERSE SHOULDER ARTHROPLASTY Right 01/06/2018  . REVERSE SHOULDER ARTHROPLASTY Right 01/06/2018   Procedure: RIGHT REVERSE SHOULDER ARTHROPLASTY;  Surgeon: [name], MD;  Location: [facility];  Service: Orthopedics;  Laterality: Right;  . REVISON OF ARTERIOVENOUS FISTULA Left 12/04/2013   Procedure: REVISON OF ARTERIOVENOUS FISTULA;  Surgeon: Rosetta Posner, MD;  Location: [facility];  Service: Vascular;  Laterality: Left;  . REVISON OF ARTERIOVENOUS FISTULA Left 0/0/3491   Procedure: PLICATION OF LEFT BRACHIOCEPHALIC  ARTERIOVENOUS FISTULA;  Surgeon: Conrad Unicoi, MD;  Location: [facility];  Service: Vascular;  Laterality: Left;  . SHOULDER ARTHROSCOPY WITH ROTATOR CUFF REPAIR AND SUBACROMIAL DECOMPRESSION Right 06/22/2017   Procedure: RIGHT SHOULDER ARTHROSCOPY WITH EXTENSIVE DEBRIDEMENT;  Surgeon: Mcarthur Rossetti, MD;  Location: [facility];  Service: Orthopedics;  Laterality: Right;  . TRIGGER FINGER RELEASE Right 10/22/2017   Procedure: RELEASE RIGHT INDEX TRIGGER FINGER/A-1 PULLEY;  Surgeon: Daryll Brod, MD;  Location: [facility];  Service: Orthopedics;  Laterality: Right;  . TUBAL LIGATION  1989  . UMBILICAL HERNIA REPAIR  1990's X 2   Social History   Occupational History  . Occupation: Disabled  Tobacco Use  . Smoking status: Former Smoker    Years: 3.00    Types: Cigarettes  . Smokeless tobacco: Never  Used  . Tobacco comment: "stopped smoking in the 1970's"  Substance and Sexual Activity  . Alcohol use: No  . Drug use: Not Currently    Types: Marijuana, Hydromorphone    Comment: as a teenager  . Sexual activity: Not Currently

## 2018-12-26 ENCOUNTER — Telehealth: Payer: Self-pay

## 2018-12-26 NOTE — Telephone Encounter (Signed)
Submitted VOB for SynviscOne, bilateral knee. 

## 2018-12-26 NOTE — Progress Notes (Signed)
Noted  

## 2018-12-29 ENCOUNTER — Encounter: Payer: Self-pay | Admitting: Sports Medicine

## 2018-12-29 ENCOUNTER — Ambulatory Visit (INDEPENDENT_AMBULATORY_CARE_PROVIDER_SITE_OTHER): Payer: Self-pay | Admitting: Sports Medicine

## 2018-12-29 ENCOUNTER — Other Ambulatory Visit: Payer: Self-pay

## 2018-12-29 VITALS — Temp 97.7°F | Resp 16

## 2018-12-29 DIAGNOSIS — L601 Onycholysis: Secondary | ICD-10-CM

## 2018-12-29 DIAGNOSIS — L602 Onychogryphosis: Secondary | ICD-10-CM

## 2018-12-29 DIAGNOSIS — E119 Type 2 diabetes mellitus without complications: Secondary | ICD-10-CM

## 2018-12-29 DIAGNOSIS — Z9889 Other specified postprocedural states: Secondary | ICD-10-CM

## 2018-12-29 NOTE — Progress Notes (Signed)
Subjective: Roberta Bryant is a 68 y.o. female patient returns to office today for follow up evaluation after having left third toenail removed on 12/15/2018.  Patient has been soaking with the help of son using Betadine and taking Tylenol as needed for pain.  Patient states that it is doing much better every once in a while gets a stinging pain to the area with very minimal swelling but no redness or drainage reports that she is using the Corticosporin drops as instructed to the area.  Patient deniesfever/chills/nausea/vomitting/any other related constitutional symptoms at this time.  Fasting blood sugar yesterday was 152  Patient is assisted by son at this visit.  Patient Active Problem List   Diagnosis Date Noted  . Shoulder arthritis 01/06/2018  . Rotator cuff arthropathy of right shoulder   . Status post arthroscopy of right shoulder 06/22/2017  . Spondylosis of cervical spine 06/16/2017  . Trigger index finger of right hand 06/11/2017  . Aortic stenosis 04/12/2017  . Coronary artery diseaseStatus post PTCA and stenting with drug-eluting stent to left main coronary artery in the summer 2017 04/12/2017  . 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 Decreased ROM of finger 03/01/2017  . Decreased ROM of wrist 03/01/2017  . Pain in finger of right hand 03/01/2017  . Headache syndrome 01/08/2017  . Right carpal tunnel syndrome 12/14/2016  . Complete tear of right rotator cuff 10/21/2016  . Gait abnormality 08/28/2016  . Nasal injury, initial encounter 07/01/2016  . Nasal septal deviation 07/01/2016  . Nasal turbinate hypertrophy 07/01/2016  . Abnormal stress test 01/01/2016  . Chest pain 11/23/2015  . ESRD on dialysis ([place])   . Type 2 diabetes mellitus with complication ([place])   . Essential hypertension   . Dyslipidemia 12/24/2014  . Hemodialysis patient ([place]) 12/24/2014  . Abnormal ECG   . Pain in the chest   . Hypotension 10/30/2014  . Abnormal EKG 10/30/2014  . 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 Essential tremor 01/10/2014  . Parkinson's disease ([place]) 01/10/2014  . Migraine without aura 07/28/2013  . End stage renal disease ([place]) 07/18/2012  . Other complications due to renal dialysis device, implant, and graft 07/18/2012    Current Outpatient Medications on File Prior to Visit  Medication Sig Dispense Refill  . acetaminophen (TYLENOL) 500 MG tablet Take 500-1,000 mg by mouth every 6 (six) hours as needed (FOR PAIN.).    Marland Kitchen aspirin EC 81 MG tablet Take 81 mg by mouth daily.     . B Complex-C-Zn-Folic Acid (DIALYVITE 426 WITH ZINC) 0.8 MG TABS Take 1 tablet by mouth every Monday, Wednesday, and Friday. After dialysis  12  . carvedilol (COREG) 12.5 MG tablet Take 25 mg by mouth every evening.   2  . clonazePAM (KLONOPIN) 0.5 MG tablet TAKE 1 TABLET BY MOUTH AT BEDTIME 30 tablet 2  . dimenhyDRINATE (DRAMAMINE) 50 MG tablet Take 50 mg by mouth every 8 (eight) hours as needed (for dizziness/vertigo).    Marland Kitchen escitalopram (LEXAPRO) 10 MG tablet TAKE 1 TABLET BY MOUTH ONCE DAILY FOR 30 DAYS    . ezetimibe (ZETIA) 10 MG tablet Take 10 mg by mouth every evening.   1  . fenofibrate 160 MG tablet Take 160 mg by mouth every evening.   1  . finasteride (PROSCAR) 5 MG tablet Take 2.5 mg by mouth daily at 12 noon.     . folic acid-vitamin b complex-vitamin c-selenium-zinc (DIALYVITE) 3 MG TABS tablet Take 1 tablet by mouth daily.    Marland Kitchen  lanthanum (FOSRENOL) 1000 MG chewable tablet Chew 2,000 mg by mouth See admin instructions. TAKE 2 TABLETS (2000 MG) BY MOUTH DAILY AFTER EACH MEALS    . levothyroxine (SYNTHROID, LEVOTHROID) 50 MCG tablet Take 50 mcg by mouth daily before breakfast.     . lidocaine-prilocaine (EMLA) cream Apply 1 application topically See admin instructions. Apply a small amount to access site 1-2 hours before dialysis--cover with occlusive dressing  12  . Liraglutide -Weight Management (SAXENDA [place]) Inject into the skin.    Marland Kitchen loperamide (IMODIUM A-D)  2 MG tablet Take 2-4 mg by mouth every Monday, Wednesday, and Friday with hemodialysis.     Marland Kitchen neomycin-polymyxin-hydrocortisone (CORTISPORIN) OTIC solution 2 drops to toe twice a day until all gone 10 mL 0  . nortriptyline (PAMELOR) 10 MG capsule Take 2 capsules (20 mg total) by mouth at bedtime. 180 capsule 1  . omeprazole (PRILOSEC) 20 MG capsule Take 20 mg by mouth 2 (two) times daily.     Marland Kitchen oxyCODONE-acetaminophen (PERCOCET/ROXICET) 5-325 MG tablet TAKE 1 TABLET BY MOUTH EVERY 4 HOURS AS NEEDED FOR DENTAL PAIN. DO NOT EXCEED 8 TABLETS IN 1 DAY    . sitaGLIPtin (JANUVIA) 25 MG tablet Take 25 mg by mouth daily at 12 noon.     . Turmeric Curcumin 500 MG CAPS Take 500 mg by mouth daily at 12 noon.     Marland Kitchen VICTOZA 18 MG/3ML SOPN     . Zinc 50 MG CAPS Take 50 mg by mouth daily at 12 noon.      No current facility-administered medications on file prior to visit.     Allergies  Allergen Reactions  . Sulfa Antibiotics Anaphylaxis  . Amoxicillin Hives    Has patient had a PCN reaction causing immediate rash, facial/tongue/throat swelling, SOB or lightheadedness with hypotension: No Has patient had a PCN reaction causing severe rash involving mucus membranes or skin necrosis: No Has patient had a PCN reaction that required hospitalization: No Has patient had a PCN reaction occurring within the last 10 years: Unknown If all of the above answers are "NO", then may proceed with Cephalosporin use.   Marland Kitchen Crestor [Rosuvastatin Calcium] Other (See Comments)    Dizziness, couldn't walk  . Ibuprofen Hives  . Nal[place]con Senior [employer]  . Statins Hives  . Chlorphen-Phenyleph-Asa Rash  . Gabapentin Other (See Comments)    Dizzy    Objective:  General: Well developed, nourished, in no acute distress, alert and oriented x3   Dermatology: Skin is warm, dry and supple bilateral.  Left third toenail bed nail bed appears to be clean, dry, with mild granular tissue and surrounding eschar/scab. (-)  Erythema. (-) Edema. (-) serosanguous drainage present. The remaining nails appear unremarkable at this time. There are no other lesions or other signs of infection  present.  Neurovascular status: Intact. No lower extremity swelling; No pain with calf compression bilateral.  Musculoskeletal: Decreased tenderness to palpation of the left third toe.    Assesement and Plan: Problem List Items Addressed This Visit    None    Visit Diagnoses    Status post nail surgery    -  Primary   Onychogryposis of toenail       Onycholysis of toenail       Diabetes mellitus without complication ([place])          -Examined patient  -Cleansed left third toe peroxide and q-tip/curetted away eschar at site and applied antibiotic cream covered with bandaid.  -Discussed plan  of care with patient. -Patient to now begin soaking in a weak solution of Betadine and warm water for at least 1 more week. Patient was instructed to soak for 15-20 minutes each day until the toe appears normal and there is no drainage, redness, tenderness, or swelling at the procedure site, and apply neosporin and a gauze or bandaid dressing each day as needed. May leave open to air at night. -Educated patient on long term care after nail surgery. -Patient was instructed to monitor the toe for reoccurrence and signs of infection; Patient advised to return to office or go to ER if toe becomes red, hot or swollen. -Patient is to return in 2 weeks for another nail check since patient is high risk diabetic and on dialysis or sooner if problems arise.  Landis Martins, DPM

## 2018-12-30 ENCOUNTER — Telehealth: Payer: Self-pay

## 2018-12-30 NOTE — Telephone Encounter (Signed)
Called and left a VM for patient to call and schedule an appointment with Dr. Marlou Sa for gel injection.  Approved for SynviscOne, bilateral knee. [place] Covered at 80% through Medicare Covered at 100% through [place] No Co-pay No PA required

## 2019-01-12 ENCOUNTER — Encounter: Payer: Self-pay | Admitting: Sports Medicine

## 2019-01-12 ENCOUNTER — Other Ambulatory Visit: Payer: Self-pay

## 2019-01-12 ENCOUNTER — Ambulatory Visit (INDEPENDENT_AMBULATORY_CARE_PROVIDER_SITE_OTHER): Payer: Medicare Other | Admitting: Sports Medicine

## 2019-01-12 ENCOUNTER — Ambulatory Visit: Payer: Medicare Other | Admitting: Sports Medicine

## 2019-01-12 VITALS — Temp 97.3°F | Resp 16

## 2019-01-12 DIAGNOSIS — N186 End stage renal disease: Secondary | ICD-10-CM

## 2019-01-12 DIAGNOSIS — E119 Type 2 diabetes mellitus without complications: Secondary | ICD-10-CM

## 2019-01-12 DIAGNOSIS — Z9889 Other specified postprocedural states: Secondary | ICD-10-CM | POA: Diagnosis not present

## 2019-01-12 DIAGNOSIS — L602 Onychogryphosis: Secondary | ICD-10-CM | POA: Diagnosis not present

## 2019-01-12 NOTE — Progress Notes (Signed)
Subjective: Roberta Bryant is a 68 y.o. female patient returns to office today for follow up evaluation after having left third toenail removed on 12/15/2018.  Patient has been soaking with the help of son using Betadine and applying Corticosporin drops.  Patient reports that she is doing much better with no pain every now and again a little bit of itchy sensation but feels like it is healing.   Patient deniesfever/chills/nausea/vomitting/any other related constitutional symptoms at this time.  Fasting blood sugar yesterday was 132.  Patient is assisted by son at this visit.  Patient Active Problem List   Diagnosis Date Noted  . Shoulder arthritis 01/06/2018  . Rotator cuff arthropathy of right shoulder   . Status post arthroscopy of right shoulder 06/22/2017  . Spondylosis of cervical spine 06/16/2017  . Trigger index finger of right hand 06/11/2017  . Aortic stenosis 04/12/2017  . Coronary artery diseaseStatus post PTCA and stenting with drug-eluting stent to left main coronary artery in the summer 2017 04/12/2017  . 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 Decreased ROM of finger 03/01/2017  . Decreased ROM of wrist 03/01/2017  . Pain in finger of right hand 03/01/2017  . Headache syndrome 01/08/2017  . Right carpal tunnel syndrome 12/14/2016  . Complete tear of right rotator cuff 10/21/2016  . Gait abnormality 08/28/2016  . Nasal injury, initial encounter 07/01/2016  . Nasal septal deviation 07/01/2016  . Nasal turbinate hypertrophy 07/01/2016  . Abnormal stress test 01/01/2016  . Chest pain 11/23/2015  . ESRD on dialysis ([place])   . Type 2 diabetes mellitus with complication ([place])   . Essential hypertension   . Dyslipidemia 12/24/2014  . Hemodialysis patient ([place]) 12/24/2014  . Abnormal ECG   . Pain in the chest   . Hypotension 10/30/2014  . Abnormal EKG 10/30/2014  . 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 Essential tremor 01/10/2014  . Parkinson's disease ([place])  01/10/2014  . Migraine without aura 07/28/2013  . End stage renal disease ([place]) 07/18/2012  . Other complications due to renal dialysis device, implant, and graft 07/18/2012    Current Outpatient Medications on File Prior to Visit  Medication Sig Dispense Refill  . acetaminophen (TYLENOL) 500 MG tablet Take 500-1,000 mg by mouth every 6 (six) hours as needed (FOR PAIN.).    Marland Kitchen aspirin EC 81 MG tablet Take 81 mg by mouth daily.     . B Complex-C-Zn-Folic Acid (DIALYVITE 185 WITH ZINC) 0.8 MG TABS Take 1 tablet by mouth every Monday, Wednesday, and Friday. After dialysis  12  . carvedilol (COREG) 12.5 MG tablet Take 25 mg by mouth every evening.   2  . clonazePAM (KLONOPIN) 0.5 MG tablet TAKE 1 TABLET BY MOUTH AT BEDTIME 30 tablet 2  . dimenhyDRINATE (DRAMAMINE) 50 MG tablet Take 50 mg by mouth every 8 (eight) hours as needed (for dizziness/vertigo).    Marland Kitchen escitalopram (LEXAPRO) 10 MG tablet TAKE 1 TABLET BY MOUTH ONCE DAILY FOR 30 DAYS    . ezetimibe (ZETIA) 10 MG tablet Take 10 mg by mouth every evening.   1  . fenofibrate 160 MG tablet Take 160 mg by mouth every evening.   1  . finasteride (PROSCAR) 5 MG tablet Take 2.5 mg by mouth daily at 12 noon.     . folic acid-vitamin b complex-vitamin c-selenium-zinc (DIALYVITE) 3 MG TABS tablet Take 1 tablet by mouth daily.    Marland Kitchen lanthanum (FOSRENOL) 1000 MG chewable tablet Chew 2,000 mg by mouth See admin instructions.  TAKE 2 TABLETS (2000 MG) BY MOUTH DAILY AFTER EACH MEALS    . levothyroxine (SYNTHROID, LEVOTHROID) 50 MCG tablet Take 50 mcg by mouth daily before breakfast.     . lidocaine-prilocaine (EMLA) cream Apply 1 application topically See admin instructions. Apply a small amount to access site 1-2 hours before dialysis--cover with occlusive dressing  12  . Liraglutide -Weight Management (SAXENDA [place]) Inject into the skin.    Marland Kitchen loperamide (IMODIUM A-D) 2 MG tablet Take 2-4 mg by mouth every Monday, Wednesday, and Friday with hemodialysis.     Marland Kitchen  neomycin-polymyxin-hydrocortisone (CORTISPORIN) OTIC solution 2 drops to toe twice a day until all gone 10 mL 0  . nortriptyline (PAMELOR) 10 MG capsule Take 2 capsules (20 mg total) by mouth at bedtime. 180 capsule 1  . omeprazole (PRILOSEC) 20 MG capsule Take 20 mg by mouth 2 (two) times daily.     Marland Kitchen oxyCODONE-acetaminophen (PERCOCET/ROXICET) 5-325 MG tablet TAKE 1 TABLET BY MOUTH EVERY 4 HOURS AS NEEDED FOR DENTAL PAIN. DO NOT EXCEED 8 TABLETS IN 1 DAY    . sitaGLIPtin (JANUVIA) 25 MG tablet Take 25 mg by mouth daily at 12 noon.     . Turmeric Curcumin 500 MG CAPS Take 500 mg by mouth daily at 12 noon.     Marland Kitchen VICTOZA 18 MG/3ML SOPN     . Zinc 50 MG CAPS Take 50 mg by mouth daily at 12 noon.      No current facility-administered medications on file prior to visit.     Allergies  Allergen Reactions  . Sulfa Antibiotics Anaphylaxis  . Amoxicillin Hives    Has patient had a PCN reaction causing immediate rash, facial/tongue/throat swelling, SOB or lightheadedness with hypotension: No Has patient had a PCN reaction causing severe rash involving mucus membranes or skin necrosis: No Has patient had a PCN reaction that required hospitalization: No Has patient had a PCN reaction occurring within the last 10 years: Unknown If all of the above answers are "NO", then may proceed with Cephalosporin use.   Marland Kitchen Crestor [Rosuvastatin Calcium] Other (See Comments)    Dizziness, couldn't walk  . Ibuprofen Hives  . Naldecon Senior [employer]  . Statins Hives  . Chlorphen-Phenyleph-Asa Rash  . Gabapentin Other (See Comments)    Dizzy    Objective:  General: Well developed, nourished, in no acute distress, alert and oriented x3   Dermatology: Skin is warm, dry and supple bilateral.  Left third toenail bed nail bed appears to be clean, dry, with dry scab. (-) Erythema. (-) Edema. (-) serosanguous drainage present. The remaining nails appear unremarkable at this time. There are no other lesions  or other signs of infection  present.  Neurovascular status: Intact. No lower extremity swelling; No pain with calf compression bilateral.  Musculoskeletal: No tenderness to palpation of the left third toe.    Assesement and Plan: Problem List Items Addressed This Visit      Genitourinary   End stage renal disease ([place])    Other Visit Diagnoses    Status post nail surgery    -  Primary   Onychogryposis of toenail       Diabetes mellitus without complication ([place])          -Examined patient   -Discussed plan of care -Patient no longer needs to soak or apply medication advised patient to leave open to air and may wear normal shoes as tolerated -Educated patient on long term care after nail surgery. -Patient  was instructed to monitor the toe for reoccurrence and signs of infection; Patient advised to return to office or go to ER if toe becomes red, hot or swollen. -Patient is to return as needed or sooner if problems arise.  Landis Martins, DPM

## 2019-01-19 ENCOUNTER — Ambulatory Visit: Payer: Medicare Other | Admitting: Orthopedic Surgery

## 2019-01-26 ENCOUNTER — Other Ambulatory Visit: Payer: Self-pay

## 2019-01-26 ENCOUNTER — Encounter: Payer: Self-pay | Admitting: Orthopedic Surgery

## 2019-01-26 ENCOUNTER — Telehealth: Payer: Self-pay | Admitting: Orthopedic Surgery

## 2019-01-26 ENCOUNTER — Ambulatory Visit (INDEPENDENT_AMBULATORY_CARE_PROVIDER_SITE_OTHER): Payer: Medicare Other | Admitting: Orthopedic Surgery

## 2019-01-26 ENCOUNTER — Ambulatory Visit: Payer: Medicare Other | Admitting: Sports Medicine

## 2019-01-26 DIAGNOSIS — M170 Bilateral primary osteoarthritis of knee: Secondary | ICD-10-CM

## 2019-01-26 NOTE — Telephone Encounter (Signed)
Patient called and stated that she wanted to know when she can remove bandages they are beginning to bing her leg.  Please call patient [phone]

## 2019-01-26 NOTE — Telephone Encounter (Signed)
IC advised could d/c tonight.

## 2019-01-27 ENCOUNTER — Encounter: Payer: Self-pay | Admitting: Orthopedic Surgery

## 2019-01-27 DIAGNOSIS — M170 Bilateral primary osteoarthritis of knee: Secondary | ICD-10-CM

## 2019-01-27 MED ORDER — HYLAN G-F 20 48 MG/6ML IX SOSY
48.0000 mg | PREFILLED_SYRINGE | INTRA_ARTICULAR | Status: AC | PRN
  Administered 2019-01-27: 48 mg via INTRA_ARTICULAR

## 2019-01-27 MED ORDER — LIDOCAINE HCL 1 % IJ SOLN
5.0000 mL | INTRAMUSCULAR | Status: AC | PRN
  Administered 2019-01-27: 5 mL

## 2019-01-27 NOTE — Progress Notes (Signed)
   Procedure Note  Patient: Roberta Bryant             Date of Birth: 1951-01-03           MRN: [id number]             Visit Date: 01/26/2019  Procedures: Visit Diagnoses:  1. Primary osteoarthritis of both knees     Large Joint Inj: bilateral knee on 01/27/2019 5:24 PM Indications: pain, joint swelling and diagnostic evaluation Details: 18 G 1.5 in needle, superolateral approach  Arthrogram: No  Medications (Right): 5 mL lidocaine 1 %; 48 mg Hylan 48 MG/6ML Medications (Left): 5 mL lidocaine 1 %; 48 mg Hylan 48 MG/6ML Outcome: tolerated well, no immediate complications Procedure, treatment alternatives, risks and benefits explained, specific risks discussed. Consent was given by the patient. Immediately prior to procedure a time out was called to verify the correct patient, procedure, equipment, support staff and site/side marked as required. Patient was prepped and draped in the usual sterile fashion.    This patient is diagnosed with osteoarthritis of the knee(s).    Radiographs show evidence of joint space narrowing, osteophytes, subchondral sclerosis and/or subchondral cysts.  This patient has knee pain which interferes with functional and activities of daily living.    This patient has experienced inadequate response, adverse effects and/or intolerance with conservative treatments such as acetaminophen, NSAIDS, topical creams, physical therapy or regular exercise, knee bracing and/or weight loss.   This patient has experienced inadequate response or has a contraindication to intra articular steroid injections for at least 3 months.   This patient is not scheduled to have a total knee replacement within 6 months of starting treatment with viscosupplementation.

## 2019-01-31 NOTE — Progress Notes (Signed)
Noted, will submit in January, 2021.

## 2019-02-14 ENCOUNTER — Other Ambulatory Visit: Payer: Self-pay | Admitting: *Deleted

## 2019-02-14 ENCOUNTER — Other Ambulatory Visit: Payer: Self-pay | Admitting: Neurology

## 2019-02-14 MED ORDER — CLONAZEPAM 0.5 MG PO TABS: 0.5000 mg | ORAL_TABLET | Freq: Every day | ORAL | 0 refills | Status: DC

## 2019-02-28 ENCOUNTER — Other Ambulatory Visit: Payer: Self-pay

## 2019-02-28 ENCOUNTER — Encounter (HOSPITAL_COMMUNITY): Payer: Self-pay | Admitting: Emergency Medicine

## 2019-02-28 ENCOUNTER — Emergency Department (HOSPITAL_COMMUNITY)
Admission: EM | Admit: 2019-02-28 | Discharge: 2019-02-28 | Disposition: A | Discharge status: Home / Self Care | Payer: Medicare Other | Attending: Emergency Medicine | Admitting: Emergency Medicine

## 2019-02-28 ENCOUNTER — Emergency Department (HOSPITAL_COMMUNITY): Payer: Medicare Other

## 2019-02-28 DIAGNOSIS — Z7982 Long term (current) use of aspirin: Secondary | ICD-10-CM | POA: Diagnosis not present

## 2019-02-28 DIAGNOSIS — R42 Dizziness and giddiness: Secondary | ICD-10-CM | POA: Insufficient documentation

## 2019-02-28 DIAGNOSIS — R29818 Other symptoms and signs involving the nervous system: Secondary | ICD-10-CM | POA: Diagnosis not present

## 2019-02-28 DIAGNOSIS — Z87891 Personal history of nicotine dependence: Secondary | ICD-10-CM | POA: Insufficient documentation

## 2019-02-28 DIAGNOSIS — I2510 Atherosclerotic heart disease of native coronary artery without angina pectoris: Secondary | ICD-10-CM | POA: Insufficient documentation

## 2019-02-28 DIAGNOSIS — R4182 Altered mental status, unspecified: Secondary | ICD-10-CM | POA: Diagnosis present

## 2019-02-28 DIAGNOSIS — N186 End stage renal disease: Secondary | ICD-10-CM | POA: Diagnosis not present

## 2019-02-28 DIAGNOSIS — I132 Hypertensive heart and chronic kidney disease with heart failure and with stage 5 chronic kidney disease, or end stage renal disease: Secondary | ICD-10-CM | POA: Insufficient documentation

## 2019-02-28 DIAGNOSIS — E039 Hypothyroidism, unspecified: Secondary | ICD-10-CM | POA: Insufficient documentation

## 2019-02-28 DIAGNOSIS — I509 Heart failure, unspecified: Secondary | ICD-10-CM | POA: Diagnosis not present

## 2019-02-28 DIAGNOSIS — E1122 Type 2 diabetes mellitus with diabetic chronic kidney disease: Secondary | ICD-10-CM | POA: Diagnosis not present

## 2019-02-28 DIAGNOSIS — Z992 Dependence on renal dialysis: Secondary | ICD-10-CM | POA: Insufficient documentation

## 2019-02-28 DIAGNOSIS — Z79899 Other long term (current) drug therapy: Secondary | ICD-10-CM | POA: Insufficient documentation

## 2019-02-28 LAB — PROTIME-INR
INR: 1.2 (ref 0.8–1.2)
Prothrombin Time: 15.4 seconds — ABNORMAL HIGH (ref 11.4–15.2)

## 2019-02-28 LAB — CBC
HCT: 38.6 % (ref 36.0–46.0)
Hemoglobin: 12.6 g/dL (ref 12.0–15.0)
MCH: 38.2 pg — ABNORMAL HIGH (ref 26.0–34.0)
MCHC: 32.6 g/dL (ref 30.0–36.0)
MCV: 117 fL — ABNORMAL HIGH (ref 80.0–100.0)
Platelets: 274 10*3/uL (ref 150–400)
RBC: 3.3 MIL/uL — ABNORMAL LOW (ref 3.87–5.11)
RDW: 13.3 % (ref 11.5–15.5)
WBC: 5.3 10*3/uL (ref 4.0–10.5)
nRBC: 0.4 % — ABNORMAL HIGH (ref 0.0–0.2)

## 2019-02-28 LAB — I-STAT CHEM 8, ED
BUN: 29 mg/dL — ABNORMAL HIGH (ref 8–23)
Calcium, Ion: 0.84 mmol/L — CL (ref 1.15–1.40)
Chloride: 97 mmol/L — ABNORMAL LOW (ref 98–111)
Creatinine, Ser: 6.9 mg/dL — ABNORMAL HIGH (ref 0.44–1.00)
Glucose, Bld: 86 mg/dL (ref 70–99)
HCT: 41 % (ref 36.0–46.0)
Hemoglobin: 13.9 g/dL (ref 12.0–15.0)
Potassium: 5 mmol/L (ref 3.5–5.1)
Sodium: 137 mmol/L (ref 135–145)
TCO2: 27 mmol/L (ref 22–32)

## 2019-02-28 LAB — DIFFERENTIAL
Abs Immature Granulocytes: 0.01 10*3/uL (ref 0.00–0.07)
Basophils Absolute: 0 10*3/uL (ref 0.0–0.1)
Basophils Relative: 1 %
Eosinophils Absolute: 0.7 10*3/uL — ABNORMAL HIGH (ref 0.0–0.5)
Eosinophils Relative: 13 %
Immature Granulocytes: 0 %
Lymphocytes Relative: 31 %
Lymphs Abs: 1.6 10*3/uL (ref 0.7–4.0)
Monocytes Absolute: 0.5 10*3/uL (ref 0.1–1.0)
Monocytes Relative: 9 %
Neutro Abs: 2.5 10*3/uL (ref 1.7–7.7)
Neutrophils Relative %: 46 %

## 2019-02-28 LAB — COMPREHENSIVE METABOLIC PANEL
ALT: 12 U/L (ref 0–44)
AST: 27 U/L (ref 15–41)
Albumin: 3.6 g/dL (ref 3.5–5.0)
Alkaline Phosphatase: 86 U/L (ref 38–126)
Anion gap: 21 — ABNORMAL HIGH (ref 5–15)
BUN: 30 mg/dL — ABNORMAL HIGH (ref 8–23)
CO2: 24 mmol/L (ref 22–32)
Calcium: 8.4 mg/dL — ABNORMAL LOW (ref 8.9–10.3)
Chloride: 93 mmol/L — ABNORMAL LOW (ref 98–111)
Creatinine, Ser: 7.15 mg/dL — ABNORMAL HIGH (ref 0.44–1.00)
GFR calc Af Amer: 6 mL/min — ABNORMAL LOW (ref >60)
GFR calc non Af Amer: 5 mL/min — ABNORMAL LOW (ref >60)
Glucose, Bld: 90 mg/dL (ref 70–99)
Potassium: 5.2 mmol/L — ABNORMAL HIGH (ref 3.5–5.1)
Sodium: 138 mmol/L (ref 135–145)
Total Bilirubin: 1 mg/dL (ref 0.3–1.2)
Total Protein: 7.2 g/dL (ref 6.5–8.1)

## 2019-02-28 LAB — APTT: aPTT: 29 seconds (ref 24–36)

## 2019-02-28 MED ORDER — SODIUM CHLORIDE 0.9 % IV SOLN: 100.0000 mL/h | INTRAVENOUS | Status: DC

## 2019-02-28 MED ORDER — ACETAMINOPHEN 325 MG PO TABS
325.0000 mg | ORAL_TABLET | Freq: Once | ORAL | Status: AC
  Administered 2019-02-28: 325 mg via ORAL
  Filled 2019-02-28: qty 1

## 2019-02-28 MED ORDER — SODIUM CHLORIDE 0.9 % IV BOLUS: 500.0000 mL | Freq: Once | INTRAVENOUS | Status: DC

## 2019-02-28 NOTE — ED Triage Notes (Signed)
Pt arrives via EMS from home with son with reports of AMS since yesterday. Son reports pt was confused after HD yesterday but states this has happened and did not worry. Pt was found more confused this AM. Hx of CVA with right sided weakness. Pt reports she has been confused after HD when they take too much fluid off

## 2019-02-28 NOTE — ED Provider Notes (Signed)
[facility] EMERGENCY DEPARTMENT Provider Note   CSN: [id number] Arrival date & time: 02/28/19  1055     History   Chief Complaint Chief Complaint  Patient presents with   Altered Mental Status    HPI Roberta Bryant is a 68 y.o. female with past medical history significant for stroke, type II DM, hyperlipidemia, hypertension, CAD, aortic stenosis, ESRD, and tremor who presents to the ED via EMS accompanied by her son for reported stroke-like symptoms.  She reports a stroke 2 years ago which has resulted in residual right-sided weakness.  She now fluctuates between a walker and wheelchair at home for mobility and lives with her son.  She states that it is not unusual for her to feel dizzy and slightly altered after hemodialysis which she received yesterday, but her son reportedly stated that this morning she has been more altered than usual.  Last known normal is complicated by usual confusion following hemodialysis.  Patient remains confused during initial exam and has poor recollection of what transpired prior to her arriving to the ED.  She complains of profound weakness and significant headache.  She states that headaches of similar severity are relatively common for her, but she states that this one feels different.  She denies any recent illness, fevers, chest pain, shortness of breath, blurred vision, or new focal neurologic deficits.     HPI  Past Medical History:  Diagnosis Date   Anemia    Aortic stenosis    CHF (congestive heart failure) (HCC)    Childhood asthma    Chronic upper back pain    Coronary artery disease    Stent to left main coronary artery second of August 2017   Decreased hearing    ESRD (end stage renal disease) on dialysis [facility])    "MWF; [facility] in [place]" (01/06/2018)   Gait abnormality 08/28/2016   GERD (gastroesophageal reflux disease)    Headache    "a few times/week" (10/30/2014)   Headache syndrome 01/08/2017    Heart murmur    History of blood transfusion 1996   "related to menses"   Hyperlipidemia    Hypertension    Hypothyroidism    IBS (irritable bowel syndrome)    Myocardial infarction ([place]) 2004   Osteoarthritis    Pneumonia    "years ago"   Rotator cuff arthropathy of right shoulder    Spondylosis of cervical spine 06/16/2017   Stroke ([place]) 07/2016   mini stroke , right side of body weak-    Type II diabetes mellitus ([place])     Patient Active Problem List   Diagnosis Date Noted   Shoulder arthritis 01/06/2018   Rotator cuff arthropathy of right shoulder    Status post arthroscopy of right shoulder 06/22/2017   Spondylosis of cervical spine 06/16/2017   Trigger index finger of right hand 06/11/2017   Aortic stenosis 04/12/2017   Coronary artery diseaseStatus post PTCA and stenting with drug-eluting stent to left main coronary artery in the summer 2017 04/12/2017   Myoclonus 03/17/2017   Decreased ROM of finger 03/01/2017   Decreased ROM of wrist 03/01/2017   Pain in finger of right hand 03/01/2017   Headache syndrome 01/08/2017   Right carpal tunnel syndrome 12/14/2016   Complete tear of right rotator cuff 10/21/2016   Gait abnormality 08/28/2016   Nasal injury, initial encounter 07/01/2016   Nasal septal deviation 07/01/2016   Nasal turbinate hypertrophy 07/01/2016   Abnormal stress test 01/01/2016   Chest pain 11/23/2015  ESRD on dialysis [facility])    Type 2 diabetes mellitus with complication ([place])    Essential hypertension    Dyslipidemia 12/24/2014   Hemodialysis patient ([place]) 12/24/2014   Abnormal ECG    Pain in the chest    Hypotension 10/30/2014   Abnormal EKG 10/30/2014   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   Essential tremor 01/10/2014   Parkinson's disease ([place]) 01/10/2014   Migraine without aura 07/28/2013   End stage renal disease ([place]) 76/16/0737   Other complications due to renal  dialysis device, implant, and graft 07/18/2012    Past Surgical History:  Procedure Laterality Date   A/V FISTULAGRAM Left 05/31/2018   Procedure: A/V FISTULAGRAM;  Surgeon: Serafina Mitchell, MD;  Location: [facility];  Service: Cardiovascular;  Laterality: Left;   APPENDECTOMY     AV FISTULA PLACEMENT Left 12/03/2006   "forearm"   AV FISTULA REPAIR Left 11/25/2009   "forearm"   CARPAL TUNNEL RELEASE Right 02/11/2017   Procedure: RIGHT CARPAL TUNNEL RELEASE;  Surgeon: Daryll Brod, MD;  Location: [facility];  Service: Orthopedics;  Laterality: Right;   CHOLECYSTECTOMY OPEN  1970's   COLONOSCOPY W/ POLYPECTOMY  07/30/2010   Minimal sigmoid diverticulosis. Small internal hemorrhoids. Otherwise normal colonoscopy to TI.    CORONARY ANGIOPLASTY     CORONARY ANGIOPLASTY WITH STENT PLACEMENT     ESOPHAGOGASTRODUODENOSCOPY  10/21/2012   Gastric polyps- status post polypectomy. Mild gastritis.    INSERTION OF DIALYSIS CATHETER Right 2010   "chest"   PARATHYROIDECTOMY  2010?   Parathyroid autotransplantation    REVERSE SHOULDER ARTHROPLASTY Right 01/06/2018   REVERSE SHOULDER ARTHROPLASTY Right 01/06/2018   Procedure: RIGHT REVERSE SHOULDER ARTHROPLASTY;  Surgeon: [name], MD;  Location: [place] [place];  Service: Orthopedics;  Laterality: Right;   REVISON OF ARTERIOVENOUS FISTULA Left 12/04/2013   Procedure: REVISON OF ARTERIOVENOUS FISTULA;  Surgeon: Rosetta Posner, MD;  Location: [facility];  Service: Vascular;  Laterality: Left;   REVISON OF ARTERIOVENOUS FISTULA Left 1/0/6269   Procedure: PLICATION OF LEFT BRACHIOCEPHALIC  ARTERIOVENOUS FISTULA;  Surgeon: Conrad Hearne, MD;  Location: [place];  Service: Vascular;  Laterality: Left;   SHOULDER ARTHROSCOPY WITH ROTATOR CUFF REPAIR AND SUBACROMIAL DECOMPRESSION Right 06/22/2017   Procedure: RIGHT SHOULDER ARTHROSCOPY WITH EXTENSIVE DEBRIDEMENT;  Surgeon: Mcarthur Rossetti, MD;  Location: [facility];  Service:  Orthopedics;  Laterality: Right;   TRIGGER FINGER RELEASE Right 10/22/2017   Procedure: RELEASE RIGHT INDEX TRIGGER FINGER/A-1 PULLEY;  Surgeon: Daryll Brod, MD;  Location: [facility];  Service: Orthopedics;  Laterality: Right;   TUBAL LIGATION  4854   UMBILICAL HERNIA REPAIR  1990's X 2     OB History   No obstetric history on file.      Home Medications    Prior to Admission medications   Medication Sig Start Date End Date Taking? Authorizing Provider  aspirin EC 81 MG tablet Take 81 mg by mouth daily.    Yes [provider]  carvedilol (COREG) 12.5 MG tablet Take 25 mg by mouth 2 (two) times daily with a meal.  08/06/16  Yes [provider]  clonazePAM (KLONOPIN) 0.5 MG tablet Take 1 tablet (0.5 mg total) by mouth at bedtime. 02/14/19  Yes Star Age, MD  ezetimibe (ZETIA) 10 MG tablet Take 10 mg by mouth every evening.  05/10/16  Yes [provider]  levothyroxine (SYNTHROID, LEVOTHROID) 50 MCG tablet Take 50 mcg by mouth daily before breakfast.  Yes [provider]  loperamide (IMODIUM A-D) 2 MG tablet Take 2-4 mg by mouth every Monday, Wednesday, and Friday with hemodialysis.    Yes [provider]  nortriptyline (PAMELOR) 10 MG capsule Take 2 capsules (20 mg total) by mouth at bedtime. 08/19/18  Yes Kathrynn Ducking, MD  omeprazole (PRILOSEC) 20 MG capsule Take 20 mg by mouth 2 (two) times daily.    Yes [provider]  sitaGLIPtin (JANUVIA) 25 MG tablet Take 25 mg by mouth daily.    Yes [provider]  Turmeric Curcumin 500 MG CAPS Take 500 mg by mouth daily at 12 noon.    Yes [provider]  VICTOZA 18 MG/3ML SOPN Inject 18 mg into the skin daily.  11/07/18  Yes [provider]  Zinc 50 MG CAPS Take 50 mg by mouth daily.    Yes [provider]  acetaminophen (TYLENOL) 500 MG tablet Take 500-1,000 mg by mouth every 6 (six) hours as needed (FOR PAIN.).    [provider]  B  Complex-C-Zn-Folic Acid (DIALYVITE 263 WITH ZINC) 0.8 MG TABS Take 1 tablet by mouth every Monday, Wednesday, and Friday. After dialysis 10/01/17   [provider]  dimenhyDRINATE (DRAMAMINE) 50 MG tablet Take 50 mg by mouth every 8 (eight) hours as needed (for dizziness/vertigo).    [provider]  escitalopram (LEXAPRO) 10 MG tablet TAKE 1 TABLET BY MOUTH ONCE DAILY FOR 30 DAYS 11/17/18   [provider]  fenofibrate 160 MG tablet Take 160 mg by mouth every evening.  03/08/17   [provider]  finasteride (PROSCAR) 5 MG tablet Take 2.5 mg by mouth daily at 12 noon.     [provider]  folic acid-vitamin b complex-vitamin c-selenium-zinc (DIALYVITE) 3 MG TABS tablet Take 1 tablet by mouth daily.    [provider]  lanthanum (FOSRENOL) 1000 MG chewable tablet Chew 2,000 mg by mouth See admin instructions. TAKE 2 TABLETS (2000 MG) BY MOUTH DAILY AFTER [facility] MEALS    [provider]  lidocaine-prilocaine (EMLA) cream Apply 1 application topically See admin instructions. Apply a small amount to access site 1-2 hours before dialysis--cover with occlusive dressing 09/24/17   [provider]  Liraglutide -Weight Management (SAXENDA [place]) Inject into the skin.    [provider]  neomycin-polymyxin-hydrocortisone (CORTISPORIN) OTIC solution 2 drops to toe twice a day until all gone 12/15/18   Landis Martins, DPM  oxyCODONE-acetaminophen (PERCOCET/ROXICET) 5-325 MG tablet TAKE 1 TABLET BY MOUTH EVERY 4 HOURS AS NEEDED FOR DENTAL PAIN. DO NOT EXCEED 8 TABLETS IN 1 DAY 11/23/18   [provider]    Family History Family History  Problem Relation Age of Onset   Epilepsy Mother    Diabetes Mother    Cancer Mother    Alcohol abuse Father     Social History Social History   Tobacco Use   Smoking status: Former Smoker    Years: 3.00    Types: Cigarettes   Smokeless tobacco: Never Used   Tobacco comment:  "stopped smoking in the 1970's"  Substance Use Topics   Alcohol use: No   Drug use: Not Currently    Types: Marijuana, Hydromorphone    Comment: as a teenager     Allergies   Sulfa antibiotics, Amoxicillin, Crestor [rosuvastatin calcium], Ibuprofen, Nal[place] senior [guaifenesin], Statins, Chlorphen-phenyleph-asa, and Gabapentin   Review of Systems Review of Systems  All other systems reviewed and are negative.    Physical Exam Updated  Vital Signs BP 94/65    Pulse 87    Temp 98.3 F (36.8 C) (Oral)    Resp 12    SpO2 98%   Physical Exam Vitals signs and nursing note reviewed. Exam conducted with a chaperone present.  Constitutional:      Appearance: Normal appearance.  HENT:     Head: Normocephalic and atraumatic.  Eyes:     General: No scleral icterus.    Extraocular Movements: Extraocular movements intact.     Conjunctiva/sclera: Conjunctivae normal.     Pupils: Pupils are equal, round, and reactive to light.  Neck:     Musculoskeletal: Normal range of motion.  Cardiovascular:     Rate and Rhythm: Normal rate and regular rhythm.     Pulses: Normal pulses.  Pulmonary:     Effort: Pulmonary effort is normal. No respiratory distress.  Abdominal:     Palpations: Abdomen is soft.     Tenderness: There is no abdominal tenderness. There is no guarding.  Skin:    General: Skin is dry.  Neurological:     Mental Status: She is alert.     GCS: GCS eye subscore is 4. GCS verbal subscore is 5. GCS motor subscore is 6.     Comments: Speech clear, pupils equal round reactive to light, extraocular movements intact. Normal peripheral visual fields. Cranial nerves III through XII normal including no facial droop. Follows commands, moves all extremities x4, normal strength to bilateral upper and lower extremities at all major muscle groups including grip. Sensation normal to light touch. Coordination intact, no limb ataxia, finger-nose-finger normal. No pronator drift. 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   Psychiatric:        Mood and Affect: Mood normal.     Comments: Patient appears confused.        ED Treatments / Results  Labs (all labs ordered are listed, but only abnormal results are displayed) Labs Reviewed  CBC - Abnormal; Notable for the following components:      Result Value   RBC 3.30 (*)    MCV 117.0 (*)    MCH 38.2 (*)    nRBC 0.4 (*)    All other components within normal limits  DIFFERENTIAL - Abnormal; Notable for the following components:   Eosinophils Absolute 0.7 (*)    All other components within normal limits  COMPREHENSIVE METABOLIC PANEL - Abnormal; Notable for the following components:   Potassium 5.2 (*)    Chloride 93 (*)    BUN 30 (*)    Creatinine, Ser 7.15 (*)    Calcium 8.4 (*)    GFR calc non Af Amer 5 (*)    GFR calc Af Amer 6 (*)    Anion gap 21 (*)    All other components within normal limits  PROTIME-INR - Abnormal; Notable for the following components:   Prothrombin Time 15.4 (*)    All other components within normal limits  I-STAT CHEM 8, ED - Abnormal; Notable for the following components:   Chloride 97 (*)    BUN 29 (*)    Creatinine, Ser 6.90 (*)    Calcium, Ion 0.84 (*)    All other components within normal limits  APTT  RAPID URINE DRUG SCREEN, HOSP PERFORMED  URINALYSIS, ROUTINE W REFLEX MICROSCOPIC    EKG EKG Interpretation  Date/Time:  Tuesday February 28 2019 11:01:49 EDT Ventricular Rate:  80 PR Interval:    QRS Duration: 88 QT Interval:  436 QTC  Calculation: 503 R Axis:   5 Text Interpretation:  Sinus rhythm Borderline low voltage, extremity leads Abnormal R-wave progression, late transition Prolonged QT interval No significant change since last tracing Artifact Abnormal ekg Confirmed by Carmin Muskrat [phone]) on 02/28/2019 11:25:45 AM   Radiology Ct Head Wo Contrast  Result Date: 02/28/2019 CLINICAL DATA:  Headache with focal neuro deficit more than 6 hours. Stroke  suspected. Dizziness. EXAM: CT HEAD WITHOUT CONTRAST TECHNIQUE: Contiguous axial images were obtained from the base of the skull through the vertex without intravenous contrast. COMPARISON:  CT head 11/24/2016. FINDINGS: Brain: There is no evidence of acute intracranial hemorrhage, mass lesion, brain edema or extra-axial fluid collection. The ventricles and subarachnoid spaces are appropriately sized for age. 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 No evidence of acute infarct. Vascular: Intracranial vascular calcifications. No hyperdense vessel identified. Skull: Negative for fracture or focal lesion. Sinuses/Orbits: Stable postsurgical changes within the right mastoid bone. The visualized paranasal sinuses, middle ears and left mastoid air cells are clear. Stable small right anterior ethmoid osteoma. Other: None. IMPRESSION: 1. Stable CT appearance of the brain with multifocal chronic small vessel ischemic changes. No CT evidence of acute stroke or hemorrhage. 2. Intracranial vascular calcifications. Electronically Signed   By: Richardean Sale M.D.   On: 02/28/2019 13:26    Procedures Procedures (including critical care time)  Medications Ordered in ED Medications  sodium chloride 0.9 % bolus 500 mL (has no administration in time range)    Followed by  0.9 %  sodium chloride infusion (has no administration in time range)  acetaminophen (TYLENOL) tablet 325 mg (325 mg Oral Given 02/28/19 1250)     Initial Impression / Assessment and Plan / ED Course  I have reviewed the triage vital signs and the nursing notes.  Pertinent labs & imaging results that were available during my care of the patient were reviewed by me and considered in my medical decision making (see chart for details).       EKG was interpreted and reviewed with no significant change from previous tracing.  Patient does not  complain of chest pain or shortness of breath and it does not appear as though ACS is the source of her discomfort.  Head CT was reviewed and demonstrates no evidence of acute stroke or hemorrhage.  TIA cannot be excluded, but patient is exhibiting no new neurologic deficits and do not believe MRI or CTA is warranted.  According to the son and patient, she is typically altered following hemodialysis and is difficult to determine or not she is at her baseline. It is reasonable to think that given her lack of new neurologic deficits or cardiac abnormalities, that her altered mental status could be attributable to metabolic derangements secondary to her chronic ESRD and hemodialysis.   Patient states that she is feeling improved since arriving to the ED. She is hemodynamically stable and her labs are not particularly concerning or different than her baseline.  Her neurologic exam was reassuring and she is safe for discharge with PCP follow-up.  Return precautions discussed.  When sharing my assessment with patient at discharge, she broke out in tears and explained that she is simply tired of hemodialysis and acknowledges that this is what is causing her symptoms and discomfort. She states that she is "tired of it" as she has been enduring the adverse effects of treatment for eleven years now. Encouraged her to  bring this issue to the attention of her sons and her PCP.  Perhaps there could be modifications to make her more comfortable.  She voiced understanding and is agreeable to the plan.  Final Clinical Impressions(s) / ED Diagnoses   Final diagnoses:  Altered mental status, unspecified altered mental status type    ED Discharge Orders    None       Reita Chard 02/28/19 1524    Carmin Muskrat, MD 03/01/19 [phone]

## 2019-02-28 NOTE — ED Notes (Signed)
Patient Alert and oriented to baseline. Stable and ambulatory to baseline. Patient verbalized understanding of the discharge instructions.  Patient belongings were taken by the patient.   

## 2019-02-28 NOTE — Discharge Instructions (Addendum)
Follow-up with your PCP.  Return to ED for any new or worsening symptoms, new neurologic deficits, worsening headache, fevers or chills, respiratory difficulty, or inability to tolerate food or drink by mouth.

## 2019-03-06 ENCOUNTER — Telehealth: Payer: Self-pay | Admitting: Orthopedic Surgery

## 2019-03-06 ENCOUNTER — Emergency Department (HOSPITAL_COMMUNITY)
Admission: EM | Admit: 2019-03-06 | Discharge: 2019-03-06 | Disposition: A | Discharge status: Home / Self Care | Payer: Medicare Other | Attending: Emergency Medicine | Admitting: Emergency Medicine

## 2019-03-06 ENCOUNTER — Encounter (HOSPITAL_COMMUNITY): Payer: Self-pay | Admitting: Emergency Medicine

## 2019-03-06 ENCOUNTER — Emergency Department (HOSPITAL_COMMUNITY): Payer: Medicare Other

## 2019-03-06 ENCOUNTER — Other Ambulatory Visit: Payer: Self-pay

## 2019-03-06 ENCOUNTER — Telehealth: Payer: Self-pay

## 2019-03-06 DIAGNOSIS — Z7984 Long term (current) use of oral hypoglycemic drugs: Secondary | ICD-10-CM | POA: Insufficient documentation

## 2019-03-06 DIAGNOSIS — I2510 Atherosclerotic heart disease of native coronary artery without angina pectoris: Secondary | ICD-10-CM | POA: Diagnosis not present

## 2019-03-06 DIAGNOSIS — G20 Parkinson's disease: Secondary | ICD-10-CM | POA: Insufficient documentation

## 2019-03-06 DIAGNOSIS — M25562 Pain in left knee: Secondary | ICD-10-CM

## 2019-03-06 DIAGNOSIS — Z7982 Long term (current) use of aspirin: Secondary | ICD-10-CM | POA: Insufficient documentation

## 2019-03-06 DIAGNOSIS — I120 Hypertensive chronic kidney disease with stage 5 chronic kidney disease or end stage renal disease: Secondary | ICD-10-CM | POA: Diagnosis not present

## 2019-03-06 DIAGNOSIS — Z79899 Other long term (current) drug therapy: Secondary | ICD-10-CM | POA: Insufficient documentation

## 2019-03-06 DIAGNOSIS — E1122 Type 2 diabetes mellitus with diabetic chronic kidney disease: Secondary | ICD-10-CM | POA: Diagnosis not present

## 2019-03-06 DIAGNOSIS — Z992 Dependence on renal dialysis: Secondary | ICD-10-CM | POA: Insufficient documentation

## 2019-03-06 DIAGNOSIS — Z87891 Personal history of nicotine dependence: Secondary | ICD-10-CM | POA: Diagnosis not present

## 2019-03-06 DIAGNOSIS — N186 End stage renal disease: Secondary | ICD-10-CM | POA: Insufficient documentation

## 2019-03-06 LAB — CBC WITH DIFFERENTIAL/PLATELET
Abs Immature Granulocytes: 0 10*3/uL (ref 0.00–0.07)
Basophils Absolute: 0 10*3/uL (ref 0.0–0.1)
Basophils Relative: 0 %
Eosinophils Absolute: 0.8 10*3/uL — ABNORMAL HIGH (ref 0.0–0.5)
Eosinophils Relative: 12 %
HCT: 30.1 % — ABNORMAL LOW (ref 36.0–46.0)
Hemoglobin: 10.2 g/dL — ABNORMAL LOW (ref 12.0–15.0)
Lymphocytes Relative: 20 %
Lymphs Abs: 1.3 10*3/uL (ref 0.7–4.0)
MCH: 39.7 pg — ABNORMAL HIGH (ref 26.0–34.0)
MCHC: 33.9 g/dL (ref 30.0–36.0)
MCV: 117.1 fL — ABNORMAL HIGH (ref 80.0–100.0)
Monocytes Absolute: 0.4 10*3/uL (ref 0.1–1.0)
Monocytes Relative: 6 %
Neutro Abs: 4 10*3/uL (ref 1.7–7.7)
Neutrophils Relative %: 62 %
Platelets: 181 10*3/uL (ref 150–400)
RBC: 2.57 MIL/uL — ABNORMAL LOW (ref 3.87–5.11)
RDW: 15.2 % (ref 11.5–15.5)
WBC: 6.5 10*3/uL (ref 4.0–10.5)
nRBC: 0 % (ref 0.0–0.2)
nRBC: 0 /100 WBC

## 2019-03-06 LAB — BASIC METABOLIC PANEL
Anion gap: 13 (ref 5–15)
BUN: 61 mg/dL — ABNORMAL HIGH (ref 8–23)
CO2: 25 mmol/L (ref 22–32)
Calcium: 7.3 mg/dL — ABNORMAL LOW (ref 8.9–10.3)
Chloride: 98 mmol/L (ref 98–111)
Creatinine, Ser: 10.32 mg/dL — ABNORMAL HIGH (ref 0.44–1.00)
GFR calc Af Amer: 4 mL/min — ABNORMAL LOW (ref >60)
GFR calc non Af Amer: 3 mL/min — ABNORMAL LOW (ref >60)
Glucose, Bld: 126 mg/dL — ABNORMAL HIGH (ref 70–99)
Potassium: 5.5 mmol/L — ABNORMAL HIGH (ref 3.5–5.1)
Sodium: 136 mmol/L (ref 135–145)

## 2019-03-06 LAB — C-REACTIVE PROTEIN: CRP: 5.3 mg/dL — ABNORMAL HIGH (ref <1.0)

## 2019-03-06 LAB — SEDIMENTATION RATE: Sed Rate: 23 mm/hr — ABNORMAL HIGH (ref 0–22)

## 2019-03-06 MED ORDER — ACETAMINOPHEN 325 MG PO TABS
650.0000 mg | ORAL_TABLET | Freq: Once | ORAL | Status: AC
  Administered 2019-03-06: 650 mg via ORAL
  Filled 2019-03-06: qty 2

## 2019-03-06 MED ORDER — MORPHINE SULFATE (PF) 4 MG/ML IV SOLN
4.0000 mg | Freq: Once | INTRAVENOUS | Status: AC
  Administered 2019-03-06: 4 mg via INTRAVENOUS
  Filled 2019-03-06: qty 1

## 2019-03-06 MED ORDER — HYDROCODONE-ACETAMINOPHEN 5-325 MG PO TABS: 1.0000 | ORAL_TABLET | Freq: Four times a day (QID) | ORAL | 0 refills | Status: AC | PRN

## 2019-03-06 MED ORDER — PREDNISONE 20 MG PO TABS
60.0000 mg | ORAL_TABLET | Freq: Once | ORAL | Status: AC
  Administered 2019-03-06: 60 mg via ORAL
  Filled 2019-03-06: qty 3

## 2019-03-06 MED ORDER — ONDANSETRON HCL 4 MG/2ML IJ SOLN
4.0000 mg | Freq: Once | INTRAMUSCULAR | Status: AC
  Administered 2019-03-06: 21:00:00 4 mg via INTRAVENOUS
  Filled 2019-03-06: qty 2

## 2019-03-06 MED ORDER — PREDNISONE 10 MG (21) PO TBPK: ORAL_TABLET | ORAL | 0 refills | Status: DC

## 2019-03-06 NOTE — Telephone Encounter (Signed)
See also other note I have sent you regarding patient. Thanks. Please advise.

## 2019-03-06 NOTE — ED Notes (Signed)
IV team at bedside 

## 2019-03-06 NOTE — ED Notes (Signed)
Pt called out asking for a drink.

## 2019-03-06 NOTE — ED Triage Notes (Signed)
Pt via [facility] with pt from home with left knee pain, she c/o not being able to ambulate or bare weight. She has missed dialysis today due to the knee pain. A/Ox4. NAD

## 2019-03-06 NOTE — ED Notes (Signed)
ED Provider at bedside. 

## 2019-03-06 NOTE — Discharge Instructions (Addendum)
Follow-up with the orthopedic specialist for any further management of this issue.  Call the office tomorrow morning. Acetaminophen: May take acetaminophen (generic for Tylenol), as needed, for pain. Your daily total maximum amount of acetaminophen from all sources should be limited to 4000mg /day for persons without liver problems, or 2000mg /day for those with liver problems. Vicodin: May take Vicodin (hydrocodone-acetaminophen) as needed for severe pain.   Do not drive or perform other dangerous activities while taking this medication as it can cause drowsiness as well as changes in reaction time and judgement.   Please note that each pill of Vicodin contains 325 mg of acetaminophen (generic for Tylenol) and the above dosage limits apply.  Prednisone: Take the prednisone, as prescribed, until finished. If you are a diabetic, please know prednisone can raise your blood sugar temporarily.

## 2019-03-06 NOTE — Telephone Encounter (Signed)
Please advise. Thanks.  

## 2019-03-06 NOTE — ED Provider Notes (Signed)
[facility] EMERGENCY DEPARTMENT Provider Note   CSN: [id number] Arrival date & time: 03/06/19  1746     History   Chief Complaint Chief Complaint  Patient presents with  . Knee Pain    HPI Roberta Bryant is a 68 y.o. female.     HPI   Roberta Bryant is a 68 y.o. female, with a history of anemia, CHF, CAD, ESRD on dialysis, GERD, DM, MI, HTN, hyperlipidemia, presenting to the ED with left knee pain she states began yesterday afternoon.  She had some pain she describes as "mild arthritis pain" in the left knee yesterday morning, but this worsened yesterday afternoon while at rest on the sofa. She has history of receiving knee injections in both knees with last injection performed about a month ago. She is a patient of Dr. Marlou Sa, [facility]. Her son called the office today, but states they did not receive a call back.  She has been taking Tylenol for pain without improvement. She did not go to dialysis today because she states her knee hurt too much. Denies history of PE/DVT.  Denies fever/chills, falls/trauma, numbness, weakness, shortness of breath, chest pain, or any other complaints.   Past Medical History:  Diagnosis Date  . Anemia   . Aortic stenosis   . CHF (congestive heart failure) ([place])   . Childhood asthma   . Chronic upper back pain   . Coronary artery disease    Stent to left main coronary artery second of August 2017  . Decreased hearing   . ESRD (end stage renal disease) on dialysis [facility])    "MWF; [facility] in [place]" (01/06/2018)  . Gait abnormality 08/28/2016  . GERD (gastroesophageal reflux disease)   . Headache    "a few times/week" (10/30/2014)  . Headache syndrome 01/08/2017  . Heart murmur   . History of blood transfusion 1996   "related to menses"  . Hyperlipidemia   . Hypertension   . Hypothyroidism   . IBS (irritable bowel syndrome)   . Myocardial infarction ([place]) 2004  . Osteoarthritis   . Pneumonia    "years ago"  . Rotator cuff arthropathy of right shoulder   . Spondylosis of cervical spine 06/16/2017  . Stroke ([place]) 07/2016   mini stroke , right side of body weak-   . Type II diabetes mellitus [facility])     Patient Active Problem List   Diagnosis Date Noted  . Shoulder arthritis 01/06/2018  . Rotator cuff arthropathy of right shoulder   . Status post arthroscopy of right shoulder 06/22/2017  . Spondylosis of cervical spine 06/16/2017  . Trigger index finger of right hand 06/11/2017  . Aortic stenosis 04/12/2017  . Coronary artery diseaseStatus post PTCA and stenting with drug-eluting stent to left main coronary artery in the summer 2017 04/12/2017  . 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 Decreased ROM of finger 03/01/2017  . Decreased ROM of wrist 03/01/2017  . Pain in finger of right hand 03/01/2017  . Headache syndrome 01/08/2017  . Right carpal tunnel syndrome 12/14/2016  . Complete tear of right rotator cuff 10/21/2016  . Gait abnormality 08/28/2016  . Nasal injury, initial encounter 07/01/2016  . Nasal septal deviation 07/01/2016  . Nasal turbinate hypertrophy 07/01/2016  . Abnormal stress test 01/01/2016  . Chest pain 11/23/2015  . ESRD on dialysis ([place])   . Type 2 diabetes mellitus with complication ([place])   . Essential hypertension   . Dyslipidemia 12/24/2014  . Hemodialysis patient ([place]) 12/24/2014  .  Abnormal ECG   . Pain in the chest   . Hypotension 10/30/2014  . Abnormal EKG 10/30/2014  . 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 Essential tremor 01/10/2014  . Parkinson's disease ([place]) 01/10/2014  . Migraine without aura 07/28/2013  . End stage renal disease ([place]) 07/18/2012  . Other complications due to renal dialysis device, implant, and graft 07/18/2012    Past Surgical History:  Procedure Laterality Date  . A/V FISTULAGRAM Left 05/31/2018   Procedure: A/V FISTULAGRAM;  Surgeon: Serafina Mitchell, MD;  Location: [facility];  Service:  Cardiovascular;  Laterality: Left;  . APPENDECTOMY    . AV FISTULA PLACEMENT Left 12/03/2006   "forearm"  . AV FISTULA REPAIR Left 11/25/2009   "forearm"  . CARPAL TUNNEL RELEASE Right 02/11/2017   Procedure: RIGHT CARPAL TUNNEL RELEASE;  Surgeon: Daryll Brod, MD;  Location: [place];  Service: Orthopedics;  Laterality: Right;  . CHOLECYSTECTOMY OPEN  1970's  . COLONOSCOPY W/ POLYPECTOMY  07/30/2010   Minimal sigmoid diverticulosis. Small internal hemorrhoids. Otherwise normal colonoscopy to TI.   Marland Kitchen CORONARY ANGIOPLASTY    . CORONARY ANGIOPLASTY WITH STENT PLACEMENT    . ESOPHAGOGASTRODUODENOSCOPY  10/21/2012   Gastric polyps- status post polypectomy. Mild gastritis.   . INSERTION OF DIALYSIS CATHETER Right 2010   "chest"  . PARATHYROIDECTOMY  2010?   Parathyroid autotransplantation   . REVERSE SHOULDER ARTHROPLASTY Right 01/06/2018  . REVERSE SHOULDER ARTHROPLASTY Right 01/06/2018   Procedure: RIGHT REVERSE SHOULDER ARTHROPLASTY;  Surgeon: [name], MD;  Location: [place];  Service: Orthopedics;  Laterality: Right;  . REVISON OF ARTERIOVENOUS FISTULA Left 12/04/2013   Procedure: REVISON OF ARTERIOVENOUS FISTULA;  Surgeon: Rosetta Posner, MD;  Location: [place];  Service: Vascular;  Laterality: Left;  . REVISON OF ARTERIOVENOUS FISTULA Left 0/08/4740   Procedure: PLICATION OF LEFT BRACHIOCEPHALIC  ARTERIOVENOUS FISTULA;  Surgeon: Conrad Emma, MD;  Location: [place];  Service: Vascular;  Laterality: Left;  . SHOULDER ARTHROSCOPY WITH ROTATOR CUFF REPAIR AND SUBACROMIAL DECOMPRESSION Right 06/22/2017   Procedure: RIGHT SHOULDER ARTHROSCOPY WITH EXTENSIVE DEBRIDEMENT;  Surgeon: Mcarthur Rossetti, MD;  Location: [place];  Service: Orthopedics;  Laterality: Right;  . TRIGGER FINGER RELEASE Right 10/22/2017   Procedure: RELEASE RIGHT INDEX TRIGGER FINGER/A-1 PULLEY;  Surgeon: Daryll Brod, MD;  Location: [place];  Service: Orthopedics;  Laterality: Right;  . TUBAL LIGATION   1989  . UMBILICAL HERNIA REPAIR  1990's X 2     OB History   No obstetric history on file.      Home Medications    Prior to Admission medications   Medication Sig Start Date End Date Taking? Authorizing Provider  acetaminophen (TYLENOL) 500 MG tablet Take 500-1,000 mg by mouth every 6 (six) hours as needed (FOR PAIN.).    [provider]  aspirin EC 81 MG tablet Take 81 mg by mouth daily.     [provider]  B Complex-C-Zn-Folic Acid (DIALYVITE 595 WITH ZINC) 0.8 MG TABS Take 1 tablet by mouth every Monday, Wednesday, and Friday. After dialysis 10/01/17   [provider]  carvedilol (COREG) 12.5 MG tablet Take 25 mg by mouth 2 (two) times daily with a meal.  08/06/16   [provider]  clonazePAM (KLONOPIN) 0.5 MG tablet Take 1 tablet (0.5 mg total) by mouth at bedtime. 02/14/19   Star Age, MD  dimenhyDRINATE (DRAMAMINE) 50 MG tablet Take 50 mg by mouth every 8 (eight) hours  as needed (for dizziness/vertigo).    [provider]  escitalopram (LEXAPRO) 10 MG tablet TAKE 1 TABLET BY MOUTH ONCE DAILY FOR 30 DAYS 11/17/18   [provider]  ezetimibe (ZETIA) 10 MG tablet Take 10 mg by mouth every evening.  05/10/16   [provider]  fenofibrate 160 MG tablet Take 160 mg by mouth every evening.  03/08/17   [provider]  finasteride (PROSCAR) 5 MG tablet Take 2.5 mg by mouth daily at 12 noon.     [provider]  folic acid-vitamin b complex-vitamin c-selenium-zinc (DIALYVITE) 3 MG TABS tablet Take 1 tablet by mouth daily.    [provider]  HYDROcodone-acetaminophen (NORCO/VICODIN) 5-325 MG tablet Take 1-2 tablets by mouth every 6 (six) hours as needed for severe pain. 03/06/19   Amma Crear C, PA-C  lanthanum (FOSRENOL) 1000 MG chewable tablet Chew 2,000 mg by mouth See admin instructions. TAKE 2 TABLETS (2000 MG) BY MOUTH DAILY AFTER EACH MEALS    [provider]  levothyroxine (SYNTHROID,  LEVOTHROID) 50 MCG tablet Take 50 mcg by mouth daily before breakfast.     [provider]  lidocaine-prilocaine (EMLA) cream Apply 1 application topically See admin instructions. Apply a small amount to access site 1-2 hours before dialysis--cover with occlusive dressing 09/24/17   [provider]  Liraglutide -Weight Management (SAXENDA [place]) Inject into the skin.    [provider]  loperamide (IMODIUM A-D) 2 MG tablet Take 2-4 mg by mouth every Monday, Wednesday, and Friday with hemodialysis.     [provider]  neomycin-polymyxin-hydrocortisone (CORTISPORIN) OTIC solution 2 drops to toe twice a day until all gone 12/15/18   Landis Martins, DPM  nortriptyline (PAMELOR) 10 MG capsule Take 2 capsules (20 mg total) by mouth at bedtime. 08/19/18   Kathrynn Ducking, MD  omeprazole (PRILOSEC) 20 MG capsule Take 20 mg by mouth 2 (two) times daily.     [provider]  oxyCODONE-acetaminophen (PERCOCET/ROXICET) 5-325 MG tablet TAKE 1 TABLET BY MOUTH EVERY 4 HOURS AS NEEDED FOR DENTAL PAIN. DO NOT EXCEED 8 TABLETS IN 1 DAY 11/23/18   [provider]  predniSONE (STERAPRED UNI-PAK 21 TAB) 10 MG (21) TBPK tablet Take 6 tabs (12m) day 1, 5 tabs (586m day 2, 4 tabs (4032mday 3, 3 tabs (26m23may 4, 2 tabs (20mg20my 5, and 1 tab (10mg)31m 6. 03/06/19   Avin Upperman C, PA-C  sitaGLIPtin (JANUVIA) 25 MG tablet Take 25 mg by mouth daily.     [provider]  Turmeric Curcumin 500 MG CAPS Take 500 mg by mouth daily at 12 noon.     [provider]  VICTOZA 18 MG/3ML SOPN Inject 18 mg into the skin daily.  11/07/18   [provider]  Zinc 50 MG CAPS Take 50 mg by mouth daily.     [provider]    Family History Family History  Problem Relation Age of Onset  . Epilepsy Mother   . Diabetes Mother   . Cancer Mother   . Alcohol abuse Father     Social History Social History   Tobacco Use  . Smoking status: Former Smoker     Years: 3.00    Types: Cigarettes  . Smokeless tobacco: Never Used  . Tobacco comment: "stopped smoking in the 1970's"  Substance Use Topics  . Alcohol use: No  . Drug use: Not Currently    Types: Marijuana, Hydromorphone    Comment:  as a teenager     Allergies   Sulfa antibiotics, Amoxicillin, Crestor [rosuvastatin calcium], Ibuprofen, Nal[place] senior [guaifenesin], Statins, Chlorphen-phenyleph-asa, and Gabapentin   Review of Systems Review of Systems  Constitutional: Negative for chills and fever.  Respiratory: Negative for shortness of breath.   Cardiovascular: Negative for chest pain.  Gastrointestinal: Negative for nausea and vomiting.  Musculoskeletal: Positive for arthralgias and joint swelling.  Neurological: Negative for syncope, weakness and numbness.  All other systems reviewed and are negative.    Physical Exam Updated Vital Signs BP 116/61   Pulse 72   Temp 98.2 F (36.8 C) (Oral)   Resp 16   SpO2 100%   Physical Exam Vitals signs and nursing note reviewed.  Constitutional:      General: She is not in acute distress.    Appearance: She is well-developed. She is obese. She is not diaphoretic.  HENT:     Head: Normocephalic and atraumatic.     Mouth/Throat:     Mouth: Mucous membranes are moist.     Pharynx: Oropharynx is clear.  Eyes:     Conjunctiva/sclera: Conjunctivae normal.  Neck:     Musculoskeletal: Neck supple.  Cardiovascular:     Rate and Rhythm: Normal rate and regular rhythm.     Pulses: Normal pulses.          Radial pulses are 2+ on the right side and 2+ on the left side.       Dorsalis pedis pulses are 2+ on the right side and 2+ on the left side.       Posterior tibial pulses are 2+ on the right side and 2+ on the left side.     Heart sounds: Normal heart sounds.     Comments: Tactile temperature in the extremities appropriate and equal bilaterally. Pulmonary:     Effort: Pulmonary effort is normal. No respiratory distress.      Breath sounds: Normal breath sounds.  Abdominal:     Palpations: Abdomen is soft.     Tenderness: There is no abdominal tenderness. There is no guarding.  Musculoskeletal:        General: Tenderness present.     Right lower leg: No edema.     Left lower leg: No edema.     Comments: Tenderness to the anterior and lateral left knee.  Edema noted to the knee.  No notable temperature abnormality.  Evaluation complicated by patient's obesity. Patient allowed range of motion to the left knee, though she states it is painful. No noted laxity, crepitus, or color change. No tenderness to the left upper leg or left lower leg. No pain, tenderness, swelling, or deformity in the left hip or ankle.  Lymphadenopathy:     Cervical: No cervical adenopathy.  Skin:    General: Skin is warm and dry.  Neurological:     Mental Status: She is alert.     Comments: Sensation light touch grossly intact in the bilateral lower extremities. Strength 5/5 in the left ankle and hip. Strength 4/5 in the left knee, though I suspect this may be due to pain.  Psychiatric:        Mood and Affect: Mood and affect normal.        Speech: Speech normal.        Behavior: Behavior normal.      ED Treatments / Results  Labs (all labs ordered are listed, but only abnormal results are displayed) Labs Reviewed  BASIC METABOLIC PANEL - Abnormal; Notable  for the following components:      Result Value   Potassium 5.5 (*)    Glucose, Bld 126 (*)    BUN 61 (*)    Creatinine, Ser 10.32 (*)    Calcium 7.3 (*)    GFR calc non Af Amer 3 (*)    GFR calc Af Amer 4 (*)    All other components within normal limits  CBC WITH DIFFERENTIAL/PLATELET - Abnormal; Notable for the following components:   RBC 2.57 (*)    Hemoglobin 10.2 (*)    HCT 30.1 (*)    MCV 117.1 (*)    MCH 39.7 (*)    Eosinophils Absolute 0.8 (*)    All other components within normal limits  C-REACTIVE PROTEIN - Abnormal; Notable for the following  components:   CRP 5.3 (*)    All other components within normal limits  SEDIMENTATION RATE - Abnormal; Notable for the following components:   Sed Rate 23 (*)    All other components within normal limits    EKG None  Radiology Dg Knee Complete 4 Views Left  Result Date: 03/06/2019 CLINICAL DATA:  Pain beginning yesterday. Swelling. No injury reported. EXAM: LEFT KNEE - COMPLETE 4+ VIEW COMPARISON:  None. FINDINGS: A moderate to large joint effusion is noted. Degenerative changes seen in all 3 compartments, most prominent in the patellofemoral compartment. No fractures noted. IMPRESSION: Tricompartmental degenerative changes, particularly in the patellofemoral compartment, with a large joint effusion. Electronically Signed   By: Dorise Bullion III M.D   On: 03/06/2019 19:15    Procedures Procedures (including critical care time)  Medications Ordered in ED Medications  morphine 4 MG/ML injection 4 mg (4 mg Intravenous Given 03/06/19 2039)  ondansetron (ZOFRAN) injection 4 mg (4 mg Intravenous Given 03/06/19 2039)  acetaminophen (TYLENOL) tablet 650 mg (650 mg Oral Given 03/06/19 2255)  predniSONE (DELTASONE) tablet 60 mg (60 mg Oral Given 03/06/19 2255)     Initial Impression / Assessment and Plan / ED Course  I have reviewed the triage vital signs and the nursing notes.  Pertinent labs & imaging results that were available during my care of the patient were reviewed by me and considered in my medical decision making (see chart for details).  Clinical Course as of Mar 05 2346  Mon Mar 06, 2019  2214 Spoke with Dr. Marlou Sa, orthopedic surgeon.  States she can see one of his partners tomorrow or Dr. Marlou Sa later in the week.  Agrees this does not sound like septic arthritis. May try a short prednisone taper.   [SJ]    Clinical Course User Index [SJ] Maricella Filyaw C, PA-C       Patient presents with left knee pain beginning yesterday.Patient is nontoxic appearing, afebrile, not  tachycardic, not tachypneic, not hypotensive, maintains excellent SPO2 on room air.  Septic arthritis was considered, but thought less likely.  No fever or leukocytosis.  ESR and CRP are not impressive or suggestive.  Exam is not suggestive.  Patient has had injections to the knee, however, she states the last injection was at least a month ago. Joint effusion with significant degenerative changes noted on x-ray. She will have close follow-up in the orthopedic office. The patient was given instructions for home care as well as return precautions. Patient voices understanding of these instructions, accepts the plan, and is comfortable with discharge.  She was able to schedule a makeup appt for dialysis for tomorrow morning.   Findings and plan of care discussed  with Myrtie Cruise, MD. Dr. Langston Masker personally evaluated and examined this patient.   Vitals:   03/06/19 1800 03/06/19 1932 03/06/19 2000 03/06/19 2254  BP: 116/61 113/65 122/66 121/66  Pulse: 72 72 73 80  Resp: 16     Temp: 98.2 F (36.8 C)     TempSrc: Oral     SpO2: 100% 100% 100% 97%     Final Clinical Impressions(s) / ED Diagnoses   Final diagnoses:  Acute pain of left knee    ED Discharge Orders         Ordered    HYDROcodone-acetaminophen (NORCO/VICODIN) 5-325 MG tablet  Every 6 hours PRN     03/06/19 2250    predniSONE (STERAPRED UNI-PAK 21 TAB) 10 MG (21) TBPK tablet     03/06/19 2250           Lorayne Bender, PA-C 03/06/19 2352    Wyvonnia Dusky, MD 03/07/19 0010

## 2019-03-06 NOTE — Telephone Encounter (Signed)
Patient left a voicemail stating that he knee is locked up and is having a hard time getting up and around.  She has taken Tylenol to no avail.  She would like to know what else can she do.  CB#[phone].  Thank you.

## 2019-03-06 NOTE — Telephone Encounter (Signed)
Patient called stating that she is having a lot of pain with her left knee.  Stated that she can't bend her left knee and has had pain since yesterday.  Would like to know what she can do to lessen the pain?  Has taken Tylenol.  CB# is [phone].  Please advise.  Thank You.

## 2019-03-06 NOTE — ED Notes (Signed)
Patient transported to X-ray 

## 2019-03-07 NOTE — Telephone Encounter (Signed)
appt scheduled for Monday. Offered appt for today and she refused.

## 2019-03-09 ENCOUNTER — Ambulatory Visit: Payer: Medicare Other | Admitting: Gastroenterology

## 2019-03-13 ENCOUNTER — Encounter: Payer: Self-pay | Admitting: Orthopedic Surgery

## 2019-03-13 ENCOUNTER — Other Ambulatory Visit: Payer: Self-pay

## 2019-03-13 ENCOUNTER — Ambulatory Visit (INDEPENDENT_AMBULATORY_CARE_PROVIDER_SITE_OTHER): Payer: Medicare Other | Admitting: Orthopedic Surgery

## 2019-03-13 VITALS — Ht 58.75 in | Wt 190.3 lb

## 2019-03-13 DIAGNOSIS — M25462 Effusion, left knee: Secondary | ICD-10-CM

## 2019-03-15 ENCOUNTER — Encounter: Payer: Self-pay | Admitting: Orthopedic Surgery

## 2019-03-15 DIAGNOSIS — M25462 Effusion, left knee: Secondary | ICD-10-CM | POA: Diagnosis not present

## 2019-03-15 MED ORDER — METHYLPREDNISOLONE ACETATE 40 MG/ML IJ SUSP
40.0000 mg | INTRAMUSCULAR | Status: AC | PRN
  Administered 2019-03-15: 07:00:00 40 mg via INTRA_ARTICULAR

## 2019-03-15 MED ORDER — LIDOCAINE HCL 1 % IJ SOLN
5.0000 mL | INTRAMUSCULAR | Status: AC | PRN
  Administered 2019-03-15: 07:00:00 5 mL

## 2019-03-15 MED ORDER — BUPIVACAINE HCL 0.25 % IJ SOLN
4.0000 mL | INTRAMUSCULAR | Status: AC | PRN
  Administered 2019-03-15: 4 mL via INTRA_ARTICULAR

## 2019-03-15 NOTE — Progress Notes (Signed)
Office Visit Note   Patient: Roberta Bryant           Date of Birth: 29-Aug-1950           MRN: [id number] Visit Date: 03/13/2019 Requested by: [name], MD [address] [place],  [place] [address] PCP: [name], MD  Subjective: Chief Complaint  Patient presents with  . Left Knee - Pain    HPI: Roberta Bryant is a patient with left knee pain.  She had a gel injection 01/26/2019 which did not help.  She states it is hard for her to bend the knee.  Denies any fevers and chills.  Went to [facility] about a week ago and was told that she possibly had fluid in her knee.  Hydrocodone and prednisone help her sleep.  Reports pain when bending or moving the knee.  Radiographs from that visit show osteopenia but no acute fracture.  Mild arthritis is present.  No personal or family history of gout or pseudogout.              ROS: All systems reviewed are negative as they relate to the chief complaint within the history of present illness.  Patient denies  fevers or chills.   Assessment & Plan: Visit Diagnoses:  1. Effusion, left knee     Plan: Impression is left knee effusion.  No evidence of acute fracture.  Weightbearing is difficult but no structural damage to the knee is present.  No real pain at rest.  Knee is aspirated and injected today.  The fluid was blood-tinged but not purulent.  I think the injection will help calm this down but if the pain recurs we may need to get MRI scanning of that knee just to rule out occult stress fracture or other damage.  The arthritis component today looks present but not predominant based on joint space preservation.  Follow-Up Instructions: Return if symptoms worsen or fail to improve.   Orders:  No orders of the defined types were placed in this encounter.  No orders of the defined types were placed in this encounter.     Procedures: Large Joint Inj on 03/15/2019 7:24 AM Indications: diagnostic evaluation, joint swelling and pain  Details: 18 G 1.5 in needle, superolateral approach  Arthrogram: No  Medications: 5 mL lidocaine 1 %; 40 mg methylPREDNISolone acetate 40 MG/ML; 4 mL bupivacaine 0.25 % Aspirate: 25 mL blood-tinged Outcome: tolerated well, no immediate complications Procedure, treatment alternatives, risks and benefits explained, specific risks discussed. Consent was given by the patient. Immediately prior to procedure a time out was called to verify the correct patient, procedure, equipment, support staff and site/side marked as required. Patient was prepped and draped in the usual sterile fashion.       Clinical Data: No additional findings.  Objective: Vital Signs: Ht 4' 10.75" (1.492 m)   Wt 190 lb 4.1 oz (86.3 kg)   BMI 38.75 kg/m   Physical Exam:   Constitutional: Patient appears well-developed HEENT:  Head: Normocephalic Eyes:EOM are normal Neck: Normal range of motion Cardiovascular: Normal rate Pulmonary/chest: Effort normal Neurologic: Patient is alert Skin: Skin is warm Psychiatric: Patient has normal mood and affect    Ortho Exam: Ortho exam demonstrates mild effusion left knee no effusion right knee.  No groin pain with internal X rotation leg.  Pedal pulses palpable.  No masses lymphadenopathy or skin changes noted in that left knee region.  Collateral crucial ligaments are stable.  Knee itself  is not warm.  Specialty Comments:  No specialty comments available.  Imaging: No results found.   PMFS History: Patient Active Problem List   Diagnosis Date Noted  . Shoulder arthritis 01/06/2018  . Rotator cuff arthropathy of right shoulder   . Status post arthroscopy of right shoulder 06/22/2017  . Spondylosis of cervical spine 06/16/2017  . Trigger index finger of right hand 06/11/2017  . Aortic stenosis 04/12/2017  . Coronary artery diseaseStatus post PTCA and stenting with drug-eluting stent to left main coronary artery in the summer 2017 04/12/2017  . 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 Decreased ROM of finger 03/01/2017  . Decreased ROM of wrist 03/01/2017  . Pain in finger of right hand 03/01/2017  . Headache syndrome 01/08/2017  . Right carpal tunnel syndrome 12/14/2016  . Complete tear of right rotator cuff 10/21/2016  . Gait abnormality 08/28/2016  . Nasal injury, initial encounter 07/01/2016  . Nasal septal deviation 07/01/2016  . Nasal turbinate hypertrophy 07/01/2016  . Abnormal stress test 01/01/2016  . Chest pain 11/23/2015  . ESRD on dialysis ([place])   . Type 2 diabetes mellitus with complication ([place])   . Essential hypertension   . Dyslipidemia 12/24/2014  . Hemodialysis patient ([facility]) 12/24/2014  . Abnormal ECG   . Pain in the chest   . Hypotension 10/30/2014  . Abnormal EKG 10/30/2014  . 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 Essential tremor 01/10/2014  . Parkinson's disease ([place]) 01/10/2014  . Migraine without aura 07/28/2013  . End stage renal disease ([place]) 07/18/2012  . Other complications due to renal dialysis device, implant, and graft 07/18/2012   Past Medical History:  Diagnosis Date  . Anemia   . Aortic stenosis   . CHF (congestive heart failure) ([place])   . Childhood asthma   . Chronic upper back pain   . Coronary artery disease    Stent to left main coronary artery second of August 2017  . Decreased hearing   . ESRD (end stage renal disease) on dialysis [facility])    "MWF; [facility] in [place]" (01/06/2018)  . Gait abnormality 08/28/2016  . GERD (gastroesophageal reflux disease)   . Headache    "a few times/week" (10/30/2014)  . Headache syndrome 01/08/2017  . Heart murmur   . History of blood transfusion 1996   "related to menses"  . Hyperlipidemia   . Hypertension   . Hypothyroidism   . IBS (irritable bowel syndrome)   . Myocardial infarction ([place]) 2004  . Osteoarthritis   . Pneumonia    "years ago"  . Rotator cuff arthropathy of right shoulder   . Spondylosis of cervical spine 06/16/2017  .  Stroke ([place]) 07/2016   mini stroke , right side of body weak-   . Type II diabetes mellitus (H[facility])     Family History  Problem Relation Age of Onset  . Epilepsy Mother   . Diabetes Mother   . Cancer Mother   . Alcohol abuse Father     Past Surgical History:  Procedure Laterality Date  . A/V FISTULAGRAM Left 05/31/2018   Procedure: A/V FISTULAGRAM;  Surgeon: Serafina Mitchell, MD;  Location: [facility];  Service: Cardiovascular;  Laterality: Left;  . APPENDECTOMY    . AV FISTULA PLACEMENT Left 12/03/2006   "forearm"  . AV FISTULA REPAIR Left 11/25/2009   "forearm"  . CARPAL TUNNEL RELEASE Right 02/11/2017   Procedure: RIGHT CARPAL TUNNEL RELEASE;  Surgeon: Daryll Brod, MD;  Location: [facility];  Service: Orthopedics;  Laterality: Right;  . CHOLECYSTECTOMY OPEN  1970's  . COLONOSCOPY W/ POLYPECTOMY  07/30/2010   Minimal sigmoid diverticulosis. Small internal hemorrhoids. Otherwise normal colonoscopy to TI.   Marland Kitchen CORONARY ANGIOPLASTY    . CORONARY ANGIOPLASTY WITH STENT PLACEMENT    . ESOPHAGOGASTRODUODENOSCOPY  10/21/2012   Gastric polyps- status post polypectomy. Mild gastritis.   . INSERTION OF DIALYSIS CATHETER Right 2010   "chest"  . PARATHYROIDECTOMY  2010?   Parathyroid autotransplantation   . REVERSE SHOULDER ARTHROPLASTY Right 01/06/2018  . REVERSE SHOULDER ARTHROPLASTY Right 01/06/2018   Procedure: RIGHT REVERSE SHOULDER ARTHROPLASTY;  Surgeon: [name], MD;  Location: [facility];  Service: Orthopedics;  Laterality: Right;  . REVISON OF ARTERIOVENOUS FISTULA Left 12/04/2013   Procedure: REVISON OF ARTERIOVENOUS FISTULA;  Surgeon: Rosetta Posner, MD;  Location: [facility];  Service: Vascular;  Laterality: Left;  . REVISON OF ARTERIOVENOUS FISTULA Left 01/12/5642   Procedure: PLICATION OF LEFT BRACHIOCEPHALIC  ARTERIOVENOUS FISTULA;  Surgeon: Conrad Albion, MD;  Location: [facility];  Service: Vascular;  Laterality: Left;  . SHOULDER ARTHROSCOPY WITH ROTATOR CUFF  REPAIR AND SUBACROMIAL DECOMPRESSION Right 06/22/2017   Procedure: RIGHT SHOULDER ARTHROSCOPY WITH EXTENSIVE DEBRIDEMENT;  Surgeon: Mcarthur Rossetti, MD;  Location: [facility];  Service: Orthopedics;  Laterality: Right;  . TRIGGER FINGER RELEASE Right 10/22/2017   Procedure: RELEASE RIGHT INDEX TRIGGER FINGER/A-1 PULLEY;  Surgeon: Daryll Brod, MD;  Location: [facility];  Service: Orthopedics;  Laterality: Right;  . TUBAL LIGATION  1989  . UMBILICAL HERNIA REPAIR  1990's X 2   Social History   Occupational History  . Occupation: Disabled  Tobacco Use  . Smoking status: Former Smoker    Years: 3.00    Types: Cigarettes  . Smokeless tobacco: Never Used  . Tobacco comment: "stopped smoking in the 1970's"  Substance and Sexual Activity  . Alcohol use: No  . Drug use: Not Currently    Types: Marijuana, Hydromorphone    Comment: as a teenager  . Sexual activity: Not Currently

## 2019-03-28 ENCOUNTER — Other Ambulatory Visit: Payer: Self-pay | Admitting: Neurology

## 2019-03-29 ENCOUNTER — Emergency Department (HOSPITAL_COMMUNITY): Payer: Medicare Other

## 2019-03-29 ENCOUNTER — Encounter (HOSPITAL_COMMUNITY): Payer: Self-pay

## 2019-03-29 ENCOUNTER — Other Ambulatory Visit: Payer: Self-pay

## 2019-03-29 ENCOUNTER — Emergency Department (HOSPITAL_COMMUNITY)
Admission: EM | Admit: 2019-03-29 | Discharge: 2019-03-30 | Disposition: A | Discharge status: Home / Self Care | Payer: Medicare Other | Attending: Emergency Medicine | Admitting: Emergency Medicine

## 2019-03-29 DIAGNOSIS — E1122 Type 2 diabetes mellitus with diabetic chronic kidney disease: Secondary | ICD-10-CM | POA: Diagnosis not present

## 2019-03-29 DIAGNOSIS — Z992 Dependence on renal dialysis: Secondary | ICD-10-CM | POA: Diagnosis not present

## 2019-03-29 DIAGNOSIS — N186 End stage renal disease: Secondary | ICD-10-CM | POA: Diagnosis not present

## 2019-03-29 DIAGNOSIS — R202 Paresthesia of skin: Secondary | ICD-10-CM | POA: Diagnosis not present

## 2019-03-29 DIAGNOSIS — R519 Headache, unspecified: Secondary | ICD-10-CM

## 2019-03-29 DIAGNOSIS — I259 Chronic ischemic heart disease, unspecified: Secondary | ICD-10-CM | POA: Insufficient documentation

## 2019-03-29 DIAGNOSIS — Z79899 Other long term (current) drug therapy: Secondary | ICD-10-CM | POA: Diagnosis not present

## 2019-03-29 DIAGNOSIS — E039 Hypothyroidism, unspecified: Secondary | ICD-10-CM | POA: Diagnosis not present

## 2019-03-29 DIAGNOSIS — Z87891 Personal history of nicotine dependence: Secondary | ICD-10-CM | POA: Diagnosis not present

## 2019-03-29 DIAGNOSIS — I509 Heart failure, unspecified: Secondary | ICD-10-CM | POA: Insufficient documentation

## 2019-03-29 DIAGNOSIS — M542 Cervicalgia: Secondary | ICD-10-CM | POA: Insufficient documentation

## 2019-03-29 DIAGNOSIS — R531 Weakness: Secondary | ICD-10-CM | POA: Diagnosis not present

## 2019-03-29 DIAGNOSIS — Z7982 Long term (current) use of aspirin: Secondary | ICD-10-CM | POA: Diagnosis not present

## 2019-03-29 DIAGNOSIS — I132 Hypertensive heart and chronic kidney disease with heart failure and with stage 5 chronic kidney disease, or end stage renal disease: Secondary | ICD-10-CM | POA: Diagnosis not present

## 2019-03-29 LAB — COMPREHENSIVE METABOLIC PANEL
ALT: 23 U/L (ref 0–44)
AST: 32 U/L (ref 15–41)
Albumin: 3.7 g/dL (ref 3.5–5.0)
Alkaline Phosphatase: 86 U/L (ref 38–126)
Anion gap: 14 (ref 5–15)
BUN: 13 mg/dL (ref 8–23)
CO2: 29 mmol/L (ref 22–32)
Calcium: 7.4 mg/dL — ABNORMAL LOW (ref 8.9–10.3)
Chloride: 92 mmol/L — ABNORMAL LOW (ref 98–111)
Creatinine, Ser: 3.66 mg/dL — ABNORMAL HIGH (ref 0.44–1.00)
GFR calc Af Amer: 14 mL/min — ABNORMAL LOW (ref >60)
GFR calc non Af Amer: 12 mL/min — ABNORMAL LOW (ref >60)
Glucose, Bld: 119 mg/dL — ABNORMAL HIGH (ref 70–99)
Potassium: 3.7 mmol/L (ref 3.5–5.1)
Sodium: 135 mmol/L (ref 135–145)
Total Bilirubin: 1.1 mg/dL (ref 0.3–1.2)
Total Protein: 7.3 g/dL (ref 6.5–8.1)

## 2019-03-29 LAB — DIFFERENTIAL
Abs Immature Granulocytes: 0 10*3/uL (ref 0.00–0.07)
Basophils Absolute: 0 10*3/uL (ref 0.0–0.1)
Basophils Relative: 0 %
Eosinophils Absolute: 0 10*3/uL (ref 0.0–0.5)
Eosinophils Relative: 0 %
Lymphocytes Relative: 16 %
Lymphs Abs: 0.9 10*3/uL (ref 0.7–4.0)
Monocytes Absolute: 0.2 10*3/uL (ref 0.1–1.0)
Monocytes Relative: 3 %
Neutro Abs: 4.8 10*3/uL (ref 1.7–7.7)
Neutrophils Relative %: 81 %
nRBC: 0 /100 WBC

## 2019-03-29 LAB — CBC
HCT: 34.9 % — ABNORMAL LOW (ref 36.0–46.0)
Hemoglobin: 11.4 g/dL — ABNORMAL LOW (ref 12.0–15.0)
MCH: 38.3 pg — ABNORMAL HIGH (ref 26.0–34.0)
MCHC: 32.7 g/dL (ref 30.0–36.0)
MCV: 117.1 fL — ABNORMAL HIGH (ref 80.0–100.0)
Platelets: 229 10*3/uL (ref 150–400)
RBC: 2.98 MIL/uL — ABNORMAL LOW (ref 3.87–5.11)
RDW: 15.8 % — ABNORMAL HIGH (ref 11.5–15.5)
WBC: 5.9 10*3/uL (ref 4.0–10.5)
nRBC: 0 % (ref 0.0–0.2)

## 2019-03-29 LAB — APTT: aPTT: 29 seconds (ref 24–36)

## 2019-03-29 LAB — PROTIME-INR
INR: 1.1 (ref 0.8–1.2)
Prothrombin Time: 13.6 seconds (ref 11.4–15.2)

## 2019-03-29 MED ORDER — METOCLOPRAMIDE HCL 5 MG/ML IJ SOLN
10.0000 mg | Freq: Once | INTRAMUSCULAR | Status: AC
  Administered 2019-03-29: 23:00:00 10 mg via INTRAVENOUS
  Filled 2019-03-29: qty 2

## 2019-03-29 MED ORDER — IOHEXOL 350 MG/ML SOLN
80.0000 mL | Freq: Once | INTRAVENOUS | Status: AC | PRN
  Administered 2019-03-29: 80 mL via INTRAVENOUS

## 2019-03-29 MED ORDER — SODIUM CHLORIDE 0.9% FLUSH: 3.0000 mL | Freq: Once | INTRAVENOUS | Status: DC

## 2019-03-29 MED ORDER — ACETAMINOPHEN 325 MG PO TABS
650.0000 mg | ORAL_TABLET | Freq: Once | ORAL | Status: AC
  Administered 2019-03-29: 22:00:00 650 mg via ORAL
  Filled 2019-03-29: qty 2

## 2019-03-29 NOTE — ED Triage Notes (Signed)
Pt arrives with [facility] from dialysis center for right arm numbness that lasted about 1 min followed by frontal headache and left sided neck pain. Pt given 650mg  tylenol at dialysis, neck pain has resolved but still having a headache. No neuro deficits noted, pt a.o, VSS

## 2019-03-30 ENCOUNTER — Emergency Department (HOSPITAL_COMMUNITY): Payer: Medicare Other

## 2019-03-30 DIAGNOSIS — R531 Weakness: Secondary | ICD-10-CM | POA: Diagnosis not present

## 2019-03-30 LAB — PROTIME-INR
INR: 1.1 (ref 0.8–1.2)
Prothrombin Time: 14.3 seconds (ref 11.4–15.2)

## 2019-03-30 LAB — APTT: aPTT: 29 seconds (ref 24–36)

## 2019-03-30 NOTE — ED Notes (Signed)
Patient verbalizes understanding of discharge instructions. Opportunity for questioning and answers were provided. Armband removed by staff, pt discharged from ED.  

## 2019-03-30 NOTE — ED Provider Notes (Signed)
12:41 AM Assumed care from Dr. Kathrynn Humble, please see their note for full history, physical and decision making until this point. In brief this is a 68 y.o. year old female who presented to the ED tonight with Headache, Neck Pain, and Numbness     Here with mild headache right upper extremity numbness and right lower extremity weakness.  Her exam is consistent with the same with 4 - strength in her right lower extremity.  Pending MRI MRA.  Symptoms of headache started to improve already low suspicion for TIA if negative.  MRI negative. Patient will follow up with dr. Jannifer Franklin her neurologist.   Discharge instructions, including strict return precautions for new or worsening symptoms, given. Patient and/or family verbalized understanding and agreement with the plan as described.   Labs, studies and imaging reviewed by myself and considered in medical decision making if ordered. Imaging interpreted by radiology.  Labs Reviewed  CBC - Abnormal; Notable for the following components:      Result Value   RBC 2.98 (*)    Hemoglobin 11.4 (*)    HCT 34.9 (*)    MCV 117.1 (*)    MCH 38.3 (*)    RDW 15.8 (*)    All other components within normal limits  COMPREHENSIVE METABOLIC PANEL - Abnormal; Notable for the following components:   Chloride 92 (*)    Glucose, Bld 119 (*)    Creatinine, Ser 3.66 (*)    Calcium 7.4 (*)    GFR calc non Af Amer 12 (*)    GFR calc Af Amer 14 (*)    All other components within normal limits  PROTIME-INR  APTT  DIFFERENTIAL  CBG MONITORING, ED    CT HEAD WO CONTRAST  Final Result    MR BRAIN WO CONTRAST    (Results Pending)  CT Angio Head W or Wo Contrast    (Results Pending)  CT ANGIO NECK W OR WO CONTRAST    (Results Pending)    No follow-ups on file.    Roberta Bryant, Corene Cornea, MD 03/30/19 [phone]

## 2019-03-30 NOTE — ED Provider Notes (Signed)
[facility] EMERGENCY DEPARTMENT Provider Note   CSN: [id number] Arrival date & time: 03/29/19  1648     History   Chief Complaint Chief Complaint  Patient presents with   Headache   Neck Pain   Numbness    HPI Roberta Bryant is a 68 y.o. female.     HPI  68 year old female with history of CHF, aortic stenosis, ESRD on hemodialysis, stroke, CAD comes in a chief complaint of headache, neck pain and numbness.  Patient reports that she was last normal at 1030 before she went to dialysis.  During her dialysis she started having right upper extremity numbness.  Her symptoms lasted for 1 hour.  At the same time as the numbness she also started having posterior headache and neck pain.  She had received Tylenol prior to my assessment and reports that the symptoms had improved significantly.  Her pain at its peak was about 8 out of 10.  Review of system is positive for also right lower extremity weakness.  Patient did not have any slurred speech, vision change.   Past Medical History:  Diagnosis Date   Anemia    Aortic stenosis    CHF (congestive heart failure) (HCC)    Childhood asthma    Chronic upper back pain    Coronary artery disease    Stent to left main coronary artery second of August 2017   Decreased hearing    ESRD (end stage renal disease) on dialysis [facility])    "MWF; [facility] in [place]" (01/06/2018)   Gait abnormality 08/28/2016   GERD (gastroesophageal reflux disease)    Headache    "a few times/week" (10/30/2014)   Headache syndrome 01/08/2017   Heart murmur    History of blood transfusion 1996   "related to menses"   Hyperlipidemia    Hypertension    Hypothyroidism    IBS (irritable bowel syndrome)    Myocardial infarction ([place]) 2004   Osteoarthritis    Pneumonia    "years ago"   Rotator cuff arthropathy of right shoulder    Spondylosis of cervical spine 06/16/2017   Stroke ([employer]) 07/2016   mini stroke , right  side of body weak-    Type II diabetes mellitus ([place])     Patient Active Problem List   Diagnosis Date Noted   Shoulder arthritis 01/06/2018   Rotator cuff arthropathy of right shoulder    Status post arthroscopy of right shoulder 06/22/2017   Spondylosis of cervical spine 06/16/2017   Trigger index finger of right hand 06/11/2017   Aortic stenosis 04/12/2017   Coronary artery diseaseStatus post PTCA and stenting with drug-eluting stent to left main coronary artery in the summer 2017 04/12/2017   Myoclonus 03/17/2017   Decreased ROM of finger 03/01/2017   Decreased ROM of wrist 03/01/2017   Pain in finger of right hand 03/01/2017   Headache syndrome 01/08/2017   Right carpal tunnel syndrome 12/14/2016   Complete tear of right rotator cuff 10/21/2016   Gait abnormality 08/28/2016   Nasal injury, initial encounter 07/01/2016   Nasal septal deviation 07/01/2016   Nasal turbinate hypertrophy 07/01/2016   Abnormal stress test 01/01/2016   Chest pain 11/23/2015   ESRD on dialysis [facility])    Type 2 diabetes mellitus with complication ([place])    Essential hypertension    Dyslipidemia 12/24/2014   Hemodialysis patient ([place]) 12/24/2014   Abnormal ECG    Pain in the chest    Hypotension 10/30/2014   Abnormal EKG  10/30/2014   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   Essential tremor 01/10/2014   Parkinson's disease ([place]) 01/10/2014   Migraine without aura 07/28/2013   End stage renal disease ([place]) 09/62/8366   Other complications due to renal dialysis device, implant, and graft 07/18/2012    Past Surgical History:  Procedure Laterality Date   A/V FISTULAGRAM Left 05/31/2018   Procedure: A/V FISTULAGRAM;  Surgeon: Serafina Mitchell, MD;  Location: [place] [facility];  Service: Cardiovascular;  Laterality: Left;   APPENDECTOMY     AV FISTULA PLACEMENT Left 12/03/2006   "forearm"   AV FISTULA REPAIR Left 11/25/2009    "forearm"   CARPAL TUNNEL RELEASE Right 02/11/2017   Procedure: RIGHT CARPAL TUNNEL RELEASE;  Surgeon: Daryll Brod, MD;  Location: [place];  Service: Orthopedics;  Laterality: Right;   CHOLECYSTECTOMY OPEN  1970's   COLONOSCOPY W/ POLYPECTOMY  07/30/2010   Minimal sigmoid diverticulosis. Small internal hemorrhoids. Otherwise normal colonoscopy to TI.    CORONARY ANGIOPLASTY     CORONARY ANGIOPLASTY WITH STENT PLACEMENT     ESOPHAGOGASTRODUODENOSCOPY  10/21/2012   Gastric polyps- status post polypectomy. Mild gastritis.    INSERTION OF DIALYSIS CATHETER Right 2010   "chest"   PARATHYROIDECTOMY  2010?   Parathyroid autotransplantation    REVERSE SHOULDER ARTHROPLASTY Right 01/06/2018   REVERSE SHOULDER ARTHROPLASTY Right 01/06/2018   Procedure: RIGHT REVERSE SHOULDER ARTHROPLASTY;  Surgeon: [name], MD;  Location: [place];  Service: Orthopedics;  Laterality: Right;   REVISON OF ARTERIOVENOUS FISTULA Left 12/04/2013   Procedure: REVISON OF ARTERIOVENOUS FISTULA;  Surgeon: Rosetta Posner, MD;  Location: [place];  Service: Vascular;  Laterality: Left;   REVISON OF ARTERIOVENOUS FISTULA Left 07/18/4763   Procedure: PLICATION OF LEFT BRACHIOCEPHALIC  ARTERIOVENOUS FISTULA;  Surgeon: Conrad Dacoma, MD;  Location: [place];  Service: Vascular;  Laterality: Left;   SHOULDER ARTHROSCOPY WITH ROTATOR CUFF REPAIR AND SUBACROMIAL DECOMPRESSION Right 06/22/2017   Procedure: RIGHT SHOULDER ARTHROSCOPY WITH EXTENSIVE DEBRIDEMENT;  Surgeon: Mcarthur Rossetti, MD;  Location: [place];  Service: Orthopedics;  Laterality: Right;   TRIGGER FINGER RELEASE Right 10/22/2017   Procedure: RELEASE RIGHT INDEX TRIGGER FINGER/A-1 PULLEY;  Surgeon: Daryll Brod, MD;  Location: [place];  Service: Orthopedics;  Laterality: Right;   TUBAL LIGATION  4650   UMBILICAL HERNIA REPAIR  1990's X 2     OB History   No obstetric history on file.      Home Medications    Prior to Admission  medications   Medication Sig Start Date End Date Taking? Authorizing Provider  acetaminophen (TYLENOL) 500 MG tablet Take 500-1,000 mg by mouth every 6 (six) hours as needed (FOR PAIN.).    [provider]  aspirin EC 81 MG tablet Take 81 mg by mouth daily.     [provider]  B Complex-C-Zn-Folic Acid (DIALYVITE 354 WITH ZINC) 0.8 MG TABS Take 1 tablet by mouth every Monday, Wednesday, and Friday. After dialysis 10/01/17   [provider]  carvedilol (COREG) 12.5 MG tablet Take 25 mg by mouth 2 (two) times daily with a meal.  08/06/16   [provider]  clonazePAM (KLONOPIN) 0.5 MG tablet Take 1 tablet (0.5 mg total) by mouth at bedtime. 02/14/19   Star Age, MD  dimenhyDRINATE (DRAMAMINE) 50 MG tablet Take 50 mg by mouth every 8 (eight) hours as needed (for dizziness/vertigo).    [provider]  escitalopram (LEXAPRO) 10 MG tablet TAKE 1  TABLET BY MOUTH ONCE DAILY FOR 30 DAYS 11/17/18   [provider]  ezetimibe (ZETIA) 10 MG tablet Take 10 mg by mouth every evening.  05/10/16   [provider]  fenofibrate 160 MG tablet Take 160 mg by mouth every evening.  03/08/17   [provider]  finasteride (PROSCAR) 5 MG tablet Take 2.5 mg by mouth daily at 12 noon.     [provider]  folic acid-vitamin b complex-vitamin c-selenium-zinc (DIALYVITE) 3 MG TABS tablet Take 1 tablet by mouth daily.    [provider]  HYDROcodone-acetaminophen (NORCO/VICODIN) 5-325 MG tablet Take 1-2 tablets by mouth every 6 (six) hours as needed for severe pain. 03/06/19   Joy, Shawn C, PA-C  lanthanum (FOSRENOL) 1000 MG chewable tablet Chew 2,000 mg by mouth See admin instructions. TAKE 2 TABLETS (2000 MG) BY MOUTH DAILY AFTER EACH MEALS    [provider]  levothyroxine (SYNTHROID, LEVOTHROID) 50 MCG tablet Take 50 mcg by mouth daily before breakfast.     [provider]  lidocaine-prilocaine (EMLA) cream Apply 1  application topically See admin instructions. Apply a small amount to access site 1-2 hours before dialysis--cover with occlusive dressing 09/24/17   [provider]  Liraglutide -Weight Management (SAXENDA Mar-Mac) Inject into the skin.    [provider]  loperamide (IMODIUM A-D) 2 MG tablet Take 2-4 mg by mouth every Monday, Wednesday, and Friday with hemodialysis.     [provider]  neomycin-polymyxin-hydrocortisone (CORTISPORIN) OTIC solution 2 drops to toe twice a day until all gone 12/15/18   Landis Martins, DPM  nortriptyline (PAMELOR) 10 MG capsule Take 2 capsules (20 mg total) by mouth at bedtime. 08/19/18   Kathrynn Ducking, MD  omeprazole (PRILOSEC) 20 MG capsule Take 20 mg by mouth 2 (two) times daily.     [provider]  oxyCODONE-acetaminophen (PERCOCET/ROXICET) 5-325 MG tablet TAKE 1 TABLET BY MOUTH EVERY 4 HOURS AS NEEDED FOR DENTAL PAIN. DO NOT EXCEED 8 TABLETS IN 1 DAY 11/23/18   [provider]  predniSONE (STERAPRED UNI-PAK 21 TAB) 10 MG (21) TBPK tablet Take 6 tabs (60mg ) day 1, 5 tabs (50mg ) day 2, 4 tabs (40mg ) day 3, 3 tabs (30mg ) day 4, 2 tabs (20mg ) day 5, and 1 tab (10mg ) day 6. 03/06/19   Joy, Shawn C, PA-C  sitaGLIPtin (JANUVIA) 25 MG tablet Take 25 mg by mouth daily.     [provider]  Turmeric Curcumin 500 MG CAPS Take 500 mg by mouth daily at 12 noon.     [provider]  VICTOZA 18 MG/3ML SOPN Inject 18 mg into the skin daily.  11/07/18   [provider]  Zinc 50 MG CAPS Take 50 mg by mouth daily.     [provider]    Family History Family History  Problem Relation Age of Onset   Epilepsy Mother    Diabetes Mother    Cancer Mother    Alcohol abuse Father     Social History Social History   Tobacco Use   Smoking status: Former Smoker    Years: 3.00    Types: Cigarettes   Smokeless tobacco: Never Used   Tobacco comment: "stopped smoking in the 1970's"  Substance Use  Topics   Alcohol use: No   Drug use: Not Currently    Types: Marijuana, Hydromorphone    Comment: as a teenager     Allergies   Sulfa antibiotics, Amoxicillin, Crestor [rosuvastatin calcium], Ibuprofen, Naldecon  senior [guaifenesin], Statins, Chlorphen-phenyleph-asa, and Gabapentin   Review of Systems Review of Systems  Constitutional: Positive for activity change.  Respiratory: Negative for shortness of breath.   Cardiovascular: Negative for chest pain.  Gastrointestinal: Negative for nausea and vomiting.  Allergic/Immunologic: Negative for immunocompromised state.  Neurological: Positive for weakness and numbness.  All other systems reviewed and are negative.    Physical Exam Updated Vital Signs BP 136/72    Pulse 74    Temp 98.9 F (37.2 C) (Oral)    Resp 18    SpO2 100%   Physical Exam Vitals signs and nursing note reviewed.  Constitutional:      Appearance: She is well-developed.  HENT:     Head: Normocephalic and atraumatic.  Neck:     Musculoskeletal: Normal range of motion and neck supple.     Comments: No meningismus Cardiovascular:     Rate and Rhythm: Normal rate.  Pulmonary:     Effort: Pulmonary effort is normal.  Abdominal:     General: Bowel sounds are normal.  Skin:    General: Skin is warm and dry.  Neurological:     Mental Status: She is alert and oriented to person, place, and time.     GCS: GCS eye subscore is 4. GCS verbal subscore is 5. GCS motor subscore is 6.     Sensory: Sensory deficit present.     Motor: Weakness present.     Comments: Subjective numbness over the right upper extremity.  Right lower extremity strength is 4- out of 5 compared to the left side which is 4+ out of 5      ED Treatments / Results  Labs (all labs ordered are listed, but only abnormal results are displayed) Labs Reviewed  CBC - Abnormal; Notable for the following components:      Result Value   RBC 2.98 (*)    Hemoglobin 11.4 (*)    HCT 34.9 (*)     MCV 117.1 (*)    MCH 38.3 (*)    RDW 15.8 (*)    All other components within normal limits  COMPREHENSIVE METABOLIC PANEL - Abnormal; Notable for the following components:   Chloride 92 (*)    Glucose, Bld 119 (*)    Creatinine, Ser 3.66 (*)    Calcium 7.4 (*)    GFR calc non Af Amer 12 (*)    GFR calc Af Amer 14 (*)    All other components within normal limits  PROTIME-INR  APTT  DIFFERENTIAL  PROTIME-INR  APTT  CBG MONITORING, ED    EKG EKG Interpretation  Date/Time:  Wednesday March 29 2019 17:20:34 EDT Ventricular Rate:  82 PR Interval:  150 QRS Duration: 84 QT Interval:  414 QTC Calculation: 483 R Axis:   139 Text Interpretation:  Normal sinus rhythm Right axis deviation Abnormal ECG No acute changes No significant change since last tracing Confirmed by Varney Biles [phone]) on 03/29/2019 9:48:00 PM   Radiology Ct Angio Head W Or Wo Contrast  Result Date: 03/30/2019 CLINICAL DATA:  Initial evaluation for transient right arm numbness, TIA. EXAM: CT ANGIOGRAPHY HEAD AND NECK TECHNIQUE: Multidetector CT imaging of the head and neck was performed using the standard protocol during bolus administration of intravenous contrast. Multiplanar CT image reconstructions and MIPs were obtained to evaluate the vascular anatomy. Carotid stenosis measurements (when applicable) are obtained utilizing NASCET criteria, using the distal internal carotid diameter as the denominator. CONTRAST:  52mL OMNIPAQUE IOHEXOL 350 MG/ML SOLN COMPARISON:  Prior head CT from earlier the same day. FINDINGS: CTA NECK FINDINGS Aortic arch: Visualized aortic arch of normal caliber with normal 3 vessel morphology. Moderate atherosclerotic change present about the aortic arch and origin of the great vessels. No hemodynamically significant stenosis. Visualized subclavian arteries widely patent. Right carotid system: Right CCA mildly tortuous but patent from its origin to the bifurcation without stenosis. Mixed  plaque about the proximal right ICA with associated stenosis of up to 55% by NASCET criteria. Right ICA otherwise patent distally without stenosis, dissection or occlusion. Left carotid system: Left CCA tortuous proximally but widely patent to the bifurcation without stenosis. Mixed plaque about the left bifurcation/proximal left ICA with associated stenosis of up to 50% by NASCET criteria. Left ICA otherwise widely patent distally to the skull base without stenosis, dissection, or occlusion. Vertebral arteries: Both vertebral arteries arise from the subclavian arteries. Mild atheromatous plaque at the origin of the left vertebral artery without significant stenosis. Left vertebral otherwise widely patent distally within the neck without stenosis, dissection, or occlusion. Right vertebral essentially occluded at its origin. Scant distal reconstitution at the mid-distal right V2 segment with subsequent reocclusion. Right vertebral occluded at the level of the skull base. Skeleton: No acute osseous abnormality. No discrete lytic or blastic osseous lesions. Mild-to-moderate multilevel cervical spondylosis, most notable at C4-5. Other neck: No other acute soft tissue abnormality within the neck. Upper chest: Visualized upper chest demonstrates no acute finding. Mild pleural-parenchymal scarring with calcification noted at the anterior right upper lobe. Review of the MIP images confirms the above findings CTA HEAD FINDINGS Anterior circulation: Petrous segments widely patent bilaterally. Scattered atheromatous plaque within the cavernous/supraclinoid ICAs with associated mild to moderate multifocal narrowing. A1 segments patent bilaterally. Normal anterior communicating artery. Anterior cerebral arteries widely patent to their distal aspects. No M1 stenosis or occlusion. Normal MCA bifurcations. Distal MCA branches well perfused and symmetric. Posterior circulation: Dominant left vertebral artery widely patent to the  vertebrobasilar junction. Right vertebral artery occluded. Neither PICA of visualized. Basilar widely patent to its distal aspect without stenosis. Superior cerebral arteries patent bilaterally. Both posterior cerebral arteries well perfused to their distal aspects without stenosis. Small right posterior communicating artery noted. Venous sinuses: Grossly patent allowing for timing the contrast bolus. Right transverse sinus not well filled or evaluated. Anatomic variants: None significant.  No intracranial aneurysm. Review of the MIP images confirms the above findings IMPRESSION: 1. Negative CTA for emergent large vessel occlusion. 2. Atherosclerotic change about the proximal ICAs bilaterally, with associated stenoses of up to 55% on the right and 50% on the left. 3. Occlusion of the right vertebral artery at its origin. Dominant left vertebral artery widely patent without hemodynamically significant stenosis. Electronically Signed   By: Jeannine Boga M.D.   On: 03/30/2019 03:55   Ct Head Wo Contrast  Result Date: 03/29/2019 CLINICAL DATA:  Headache EXAM: CT HEAD WITHOUT CONTRAST TECHNIQUE: Contiguous axial images were obtained from the base of the skull through the vertex without intravenous contrast. COMPARISON:  CT 02/28/2019 FINDINGS: Brain: No acute territorial infarction, hemorrhage, or intracranial mass. Small chronic infarct in the left parietal lobe. Chronic appearing lacunar infarct in the left basal ganglia adjacent to the frontal horn. Mild atrophy. Moderate small vessel ischemic changes of the white matter. Stable ventricle size. Vascular: No hyperdense vessels.  Carotid vascular calcification Skull: Normal. Negative for fracture or focal lesion. Sinuses/Orbits: Small osteoma in the right ethmoid. Other: None IMPRESSION: 1. No CT evidence for acute intracranial abnormality.  2. Small vessel ischemic changes of the white matter. Chronic left parietal and basal ganglial infarct Electronically  Signed   By: Donavan Foil M.D.   On: 03/29/2019 19:18   Ct Angio Neck W Or Wo Contrast  Result Date: 03/30/2019 CLINICAL DATA:  Initial evaluation for transient right arm numbness, TIA. EXAM: CT ANGIOGRAPHY HEAD AND NECK TECHNIQUE: Multidetector CT imaging of the head and neck was performed using the standard protocol during bolus administration of intravenous contrast. Multiplanar CT image reconstructions and MIPs were obtained to evaluate the vascular anatomy. Carotid stenosis measurements (when applicable) are obtained utilizing NASCET criteria, using the distal internal carotid diameter as the denominator. CONTRAST:  8mL OMNIPAQUE IOHEXOL 350 MG/ML SOLN COMPARISON:  Prior head CT from earlier the same day. FINDINGS: CTA NECK FINDINGS Aortic arch: Visualized aortic arch of normal caliber with normal 3 vessel morphology. Moderate atherosclerotic change present about the aortic arch and origin of the great vessels. No hemodynamically significant stenosis. Visualized subclavian arteries widely patent. Right carotid system: Right CCA mildly tortuous but patent from its origin to the bifurcation without stenosis. Mixed plaque about the proximal right ICA with associated stenosis of up to 55% by NASCET criteria. Right ICA otherwise patent distally without stenosis, dissection or occlusion. Left carotid system: Left CCA tortuous proximally but widely patent to the bifurcation without stenosis. Mixed plaque about the left bifurcation/proximal left ICA with associated stenosis of up to 50% by NASCET criteria. Left ICA otherwise widely patent distally to the skull base without stenosis, dissection, or occlusion. Vertebral arteries: Both vertebral arteries arise from the subclavian arteries. Mild atheromatous plaque at the origin of the left vertebral artery without significant stenosis. Left vertebral otherwise widely patent distally within the neck without stenosis, dissection, or occlusion. Right vertebral  essentially occluded at its origin. Scant distal reconstitution at the mid-distal right V2 segment with subsequent reocclusion. Right vertebral occluded at the level of the skull base. Skeleton: No acute osseous abnormality. No discrete lytic or blastic osseous lesions. Mild-to-moderate multilevel cervical spondylosis, most notable at C4-5. Other neck: No other acute soft tissue abnormality within the neck. Upper chest: Visualized upper chest demonstrates no acute finding. Mild pleural-parenchymal scarring with calcification noted at the anterior right upper lobe. Review of the MIP images confirms the above findings CTA HEAD FINDINGS Anterior circulation: Petrous segments widely patent bilaterally. Scattered atheromatous plaque within the cavernous/supraclinoid ICAs with associated mild to moderate multifocal narrowing. A1 segments patent bilaterally. Normal anterior communicating artery. Anterior cerebral arteries widely patent to their distal aspects. No M1 stenosis or occlusion. Normal MCA bifurcations. Distal MCA branches well perfused and symmetric. Posterior circulation: Dominant left vertebral artery widely patent to the vertebrobasilar junction. Right vertebral artery occluded. Neither PICA of visualized. Basilar widely patent to its distal aspect without stenosis. Superior cerebral arteries patent bilaterally. Both posterior cerebral arteries well perfused to their distal aspects without stenosis. Small right posterior communicating artery noted. Venous sinuses: Grossly patent allowing for timing the contrast bolus. Right transverse sinus not well filled or evaluated. Anatomic variants: None significant.  No intracranial aneurysm. Review of the MIP images confirms the above findings IMPRESSION: 1. Negative CTA for emergent large vessel occlusion. 2. Atherosclerotic change about the proximal ICAs bilaterally, with associated stenoses of up to 55% on the right and 50% on the left. 3. Occlusion of the right  vertebral artery at its origin. Dominant left vertebral artery widely patent without hemodynamically significant stenosis. Electronically Signed   By: Pincus Badder.D.  On: 03/30/2019 03:55   Mr Brain Wo Contrast  Result Date: 03/30/2019 CLINICAL DATA:  Initial evaluation for possible stroke, right arm numbness, headache. EXAM: MRI HEAD WITHOUT CONTRAST TECHNIQUE: Multiplanar, multiecho pulse sequences of the brain and surrounding structures were obtained without intravenous contrast. COMPARISON:  Prior CTA and CT from earlier same day. FINDINGS: Brain: Generalized age-related atrophy. Patchy and confluent T2/FLAIR hyperintensity involving the periventricular deep white matter both cerebral hemispheres most consistent with chronic small vessel ischemic disease, advanced in nature. Patchy involvement of the pons noted. Superimposed remote lacunar infarct at the anterior left basal ganglia. Additional small remote chronic cortical infarct noted at the left temporal occipital region. Associated small amount of chronic hemosiderin staining. No acute abnormal foci of restricted diffusion to suggest acute or subacute ischemia. Gray-white matter differentiation maintained. No other areas of remote cortical infarction. No other evidence for acute or chronic intracranial hemorrhage. No mass lesion, midline shift or mass effect. No hydrocephalus. No extra-axial fluid collection. Pituitary gland within normal limits. Midline structures intact. Vascular: Loss of normal flow void within the right V4 segment, consistent with occlusion, better seen on prior CTA. Major intracranial vascular flow voids are otherwise maintained. Skull and upper cervical spine: Craniocervical junction within normal limits. Bone marrow signal intensity normal. No scalp soft tissue abnormality. Sinuses/Orbits: Globes and orbital soft tissues within normal limits. Paranasal sinuses are clear. Small right mastoid effusion noted, of doubtful  significance. Inner ear structures grossly normal. Other: None. IMPRESSION: 1. No acute intracranial infarct or other abnormality identified. 2. Small remote left temporal occipital cortical infarct, with additional chronic left basal ganglia lacunar infarct. 3. Underlying age-related cerebral atrophy with advanced chronic microvascular ischemic disease. Electronically Signed   By: Jeannine Boga M.D.   On: 03/30/2019 03:36    Procedures Procedures (including critical care time)  Medications Ordered in ED Medications  acetaminophen (TYLENOL) tablet 650 mg (650 mg Oral Given 03/29/19 2142)  metoCLOPramide (REGLAN) injection 10 mg (10 mg Intravenous Given 03/29/19 2256)  iohexol (OMNIPAQUE) 350 MG/ML injection 80 mL (80 mLs Intravenous Contrast Given 03/29/19 2349)     Initial Impression / Assessment and Plan / ED Course  I have reviewed the triage vital signs and the nursing notes.  Pertinent labs & imaging results that were available during my care of the patient were reviewed by me and considered in my medical decision making (see chart for details).        68 year old comes in a chief complaint of headache, neck pain, right upper extremity numbness that lasted for 1 hour, still present but not as pronounced.  She is also having right lower extremity weakness.  Concerns are that patient might have had an ischemic stroke.  TIA less likely given that she still having some subjective numbness and right lower extremity weakness.  Additionally we have considered carotid artery aneurysm, subarachnoid hemorrhage in the differential diagnosis as well.  CT angiogram head and neck ordered.  Her headache is a lot better therefore I do not think she needs an LP if the CT angiogram is reassuring. The headache is not tender in nature, nor is there any meningismus, reproducible symptoms with movement of the head. MRI ordered to rule out stroke.  If the imaging is negative then she can be  discharged with outpatient neuro follow-up.  Final Clinical Impressions(s) / ED Diagnoses   Final diagnoses:  Weakness  Paresthesia  Acute nonintractable headache, unspecified headache type    ED Discharge Orders    None  Varney Biles, MD 03/30/19 1537

## 2019-04-03 NOTE — Progress Notes (Deleted)
PATIENT: Roberta Bryant DOB: 1951/03/04  REASON FOR VISIT: follow up HISTORY FROM: patient  HISTORY OF PRESENT ILLNESS: Today 04/03/19  HISTORY 08/09/2018 Dr. Jannifer Franklin: Roberta Bryant is a 68 year old right-handed Hispanic female with a history of degenerative arthritis affecting the left knee.  She also has some back pain and pain down the right leg to the knee.  This generally comes on with weightbearing.  The patient has a walker but it is too tall for her.  She has not had any recent physical therapy for her walking.  The patient has mainly been restricted to a wheelchair to get around, she does minimal ambulation at this point.  She returns to this office for an evaluation.   REVIEW OF SYSTEMS: Out of a complete 14 system review of symptoms, the patient complains only of the following symptoms, and all other reviewed systems are negative.  ALLERGIES: Allergies  Allergen Reactions  . Sulfa Antibiotics Anaphylaxis  . Amoxicillin Hives    Has patient had a PCN reaction causing immediate rash, facial/tongue/throat swelling, SOB or lightheadedness with hypotension: No Has patient had a PCN reaction causing severe rash involving mucus membranes or skin necrosis: No Has patient had a PCN reaction that required hospitalization: No Has patient had a PCN reaction occurring within the last 10 years: Unknown If all of the above answers are "NO", then may proceed with Cephalosporin use.   Marland Kitchen Crestor [Rosuvastatin Calcium] Other (See Comments)    Dizziness, couldn't walk  . Ibuprofen Hives  . Naldecon Senior [employer]  . Statins Hives  . Chlorphen-Phenyleph-Asa Rash  . Gabapentin Other (See Comments)    Dizzy    HOME MEDICATIONS: Outpatient Medications Prior to Visit  Medication Sig Dispense Refill  . acetaminophen (TYLENOL) 500 MG tablet Take 500-1,000 mg by mouth every 6 (six) hours as needed (FOR PAIN.).    Marland Kitchen aspirin EC 81 MG tablet Take 81 mg by mouth daily.     . B  Complex-C-Zn-Folic Acid (DIALYVITE 937 WITH ZINC) 0.8 MG TABS Take 1 tablet by mouth every Monday, Wednesday, and Friday. After dialysis  12  . carvedilol (COREG) 12.5 MG tablet Take 25 mg by mouth 2 (two) times daily with a meal.   2  . clonazePAM (KLONOPIN) 0.5 MG tablet Take 1 tablet (0.5 mg total) by mouth at bedtime. 30 tablet 0  . dimenhyDRINATE (DRAMAMINE) 50 MG tablet Take 50 mg by mouth every 8 (eight) hours as needed (for dizziness/vertigo).    Marland Kitchen escitalopram (LEXAPRO) 10 MG tablet TAKE 1 TABLET BY MOUTH ONCE DAILY FOR 30 DAYS    . ezetimibe (ZETIA) 10 MG tablet Take 10 mg by mouth every evening.   1  . fenofibrate 160 MG tablet Take 160 mg by mouth every evening.   1  . finasteride (PROSCAR) 5 MG tablet Take 2.5 mg by mouth daily at 12 noon.     . folic acid-vitamin b complex-vitamin c-selenium-zinc (DIALYVITE) 3 MG TABS tablet Take 1 tablet by mouth daily.    Marland Kitchen HYDROcodone-acetaminophen (NORCO/VICODIN) 5-325 MG tablet Take 1-2 tablets by mouth every 6 (six) hours as needed for severe pain. 10 tablet 0  . lanthanum (FOSRENOL) 1000 MG chewable tablet Chew 2,000 mg by mouth See admin instructions. TAKE 2 TABLETS (2000 MG) BY MOUTH DAILY AFTER EACH MEALS    . levothyroxine (SYNTHROID, LEVOTHROID) 50 MCG tablet Take 50 mcg by mouth daily before breakfast.     . lidocaine-prilocaine (EMLA) cream Apply 1 application topically  See admin instructions. Apply a small amount to access site 1-2 hours before dialysis--cover with occlusive dressing  12  . Liraglutide -Weight Management (SAXENDA [place]) Inject into the skin.    Marland Kitchen loperamide (IMODIUM A-D) 2 MG tablet Take 2-4 mg by mouth every Monday, Wednesday, and Friday with hemodialysis.     Marland Kitchen neomycin-polymyxin-hydrocortisone (CORTISPORIN) OTIC solution 2 drops to toe twice a day until all gone 10 mL 0  . nortriptyline (PAMELOR) 10 MG capsule Take 2 capsules (20 mg total) by mouth at bedtime. 180 capsule 1  . omeprazole (PRILOSEC) 20 MG capsule Take 20  mg by mouth 2 (two) times daily.     Marland Kitchen oxyCODONE-acetaminophen (PERCOCET/ROXICET) 5-325 MG tablet TAKE 1 TABLET BY MOUTH EVERY 4 HOURS AS NEEDED FOR DENTAL PAIN. DO NOT EXCEED 8 TABLETS IN 1 DAY    . predniSONE (STERAPRED UNI-PAK 21 TAB) 10 MG (21) TBPK tablet Take 6 tabs (60mg ) day 1, 5 tabs (50mg ) day 2, 4 tabs (40mg ) day 3, 3 tabs (30mg ) day 4, 2 tabs (20mg ) day 5, and 1 tab (10mg ) day 6. 21 tablet 0  . sitaGLIPtin (JANUVIA) 25 MG tablet Take 25 mg by mouth daily.     . Turmeric Curcumin 500 MG CAPS Take 500 mg by mouth daily at 12 noon.     Marland Kitchen VICTOZA 18 MG/3ML SOPN Inject 18 mg into the skin daily.     . Zinc 50 MG CAPS Take 50 mg by mouth daily.      No facility-administered medications prior to visit.     PAST MEDICAL HISTORY: Past Medical History:  Diagnosis Date  . Anemia   . Aortic stenosis   . CHF (congestive heart failure) ([place])   . Childhood asthma   . Chronic upper back pain   . Coronary artery disease    Stent to left main coronary artery second of August 2017  . Decreased hearing   . ESRD (end stage renal disease) on dialysis [facility])    "MWF; [facility] in [place]" (01/06/2018)  . Gait abnormality 08/28/2016  . GERD (gastroesophageal reflux disease)   . Headache    "a few times/week" (10/30/2014)  . Headache syndrome 01/08/2017  . Heart murmur   . History of blood transfusion 1996   "related to menses"  . Hyperlipidemia   . Hypertension   . Hypothyroidism   . IBS (irritable bowel syndrome)   . Myocardial infarction ([place]) 2004  . Osteoarthritis   . Pneumonia    "years ago"  . Rotator cuff arthropathy of right shoulder   . Spondylosis of cervical spine 06/16/2017  . Stroke ([place]) 07/2016   mini stroke , right side of body weak-   . Type II diabetes mellitus ([place])     PAST SURGICAL HISTORY: Past Surgical History:  Procedure Laterality Date  . A/V FISTULAGRAM Left 05/31/2018   Procedure: A/V FISTULAGRAM;  Surgeon: Serafina Mitchell, MD;  Location: [place] [facility];   Service: Cardiovascular;  Laterality: Left;  . APPENDECTOMY    . AV FISTULA PLACEMENT Left 12/03/2006   "forearm"  . AV FISTULA REPAIR Left 11/25/2009   "forearm"  . CARPAL TUNNEL RELEASE Right 02/11/2017   Procedure: RIGHT CARPAL TUNNEL RELEASE;  Surgeon: Daryll Brod, MD;  Location: [place];  Service: Orthopedics;  Laterality: Right;  . CHOLECYSTECTOMY OPEN  1970's  . COLONOSCOPY W/ POLYPECTOMY  07/30/2010   Minimal sigmoid diverticulosis. Small internal hemorrhoids. Otherwise normal colonoscopy to TI.   Marland Kitchen CORONARY ANGIOPLASTY    .  CORONARY ANGIOPLASTY WITH STENT PLACEMENT    . ESOPHAGOGASTRODUODENOSCOPY  10/21/2012   Gastric polyps- status post polypectomy. Mild gastritis.   . INSERTION OF DIALYSIS CATHETER Right 2010   "chest"  . PARATHYROIDECTOMY  2010?   Parathyroid autotransplantation   . REVERSE SHOULDER ARTHROPLASTY Right 01/06/2018  . REVERSE SHOULDER ARTHROPLASTY Right 01/06/2018   Procedure: RIGHT REVERSE SHOULDER ARTHROPLASTY;  Surgeon: [name], MD;  Location: [facility];  Service: Orthopedics;  Laterality: Right;  . REVISON OF ARTERIOVENOUS FISTULA Left 12/04/2013   Procedure: REVISON OF ARTERIOVENOUS FISTULA;  Surgeon: Rosetta Posner, MD;  Location: [facility];  Service: Vascular;  Laterality: Left;  . REVISON OF ARTERIOVENOUS FISTULA Left 06/09/9415   Procedure: PLICATION OF LEFT BRACHIOCEPHALIC  ARTERIOVENOUS FISTULA;  Surgeon: Conrad Durango, MD;  Location: [place];  Service: Vascular;  Laterality: Left;  . SHOULDER ARTHROSCOPY WITH ROTATOR CUFF REPAIR AND SUBACROMIAL DECOMPRESSION Right 06/22/2017   Procedure: RIGHT SHOULDER ARTHROSCOPY WITH EXTENSIVE DEBRIDEMENT;  Surgeon: Mcarthur Rossetti, MD;  Location: [place];  Service: Orthopedics;  Laterality: Right;  . TRIGGER FINGER RELEASE Right 10/22/2017   Procedure: RELEASE RIGHT INDEX TRIGGER FINGER/A-1 PULLEY;  Surgeon: Daryll Brod, MD;  Location: [place];  Service: Orthopedics;  Laterality: Right;  . TUBAL  LIGATION  1989  . UMBILICAL HERNIA REPAIR  1990's X 2    FAMILY HISTORY: Family History  Problem Relation Age of [place]  . Epilepsy Mother   . Diabetes Mother   . Cancer Mother   . Alcohol abuse Father     SOCIAL HISTORY: Social History   Socioeconomic History  . Marital status: Divorced    Spouse name: Not on file  . Number of children: 4  . Years of education: Some college  . Highest education level: Not on file  Occupational History  . Occupation: Disabled  Social Needs  . Financial resource strain: Not on file  . Food insecurity    Worry: Not on file    Inability: Not on file  . Transportation needs    Medical: Not on file    Non-medical: Not on file  Tobacco Use  . Smoking status: Former Smoker    Years: 3.00    Types: Cigarettes  . Smokeless tobacco: Never Used  . Tobacco comment: "stopped smoking in the 1970's"  Substance and Sexual Activity  . Alcohol use: No  . Drug use: Not Currently    Types: Marijuana, Hydromorphone    Comment: as a teenager  . Sexual activity: Not Currently  Lifestyle  . Physical activity    Days per week: Not on file    Minutes per session: Not on file  . Stress: Not on file  Relationships  . Social Herbalist on phone: Not on file    Gets together: Not on file    Attends religious service: Not on file    Active member of club or organization: Not on file    Attends meetings of clubs or organizations: Not on file    Relationship status: Not on file  . Intimate partner violence    Fear of current or ex partner: Not on file    Emotionally abused: Not on file    Physically abused: Not on file    Forced sexual activity: Not on file  Other Topics Concern  . Not on file  Social History Narrative   Lives at home with sons   Caffeine use: Coffee-decaf   Right-handed  PHYSICAL EXAM  There were no vitals filed for this visit. There is no height or weight on file to calculate BMI.  Generalized: Well  developed, in no acute distress   Neurological examination  Mentation: Alert oriented to time, place, history taking. Follows all commands speech and language fluent Cranial nerve II-XII: Pupils were equal round reactive to light. Extraocular movements were full, visual field were full on confrontational test. Facial sensation and strength were normal. Uvula tongue midline. Head turning and shoulder shrug  were normal and symmetric. Motor: The motor testing reveals 5 over 5 strength of all 4 extremities. Good symmetric motor tone is noted throughout.  Sensory: Sensory testing is intact to soft touch on all 4 extremities. No evidence of extinction is noted.  Coordination: Cerebellar testing reveals good finger-nose-finger and heel-to-shin bilaterally.  Gait and station: Gait is normal. Tandem gait is normal. Romberg is negative. No drift is seen.  Reflexes: Deep tendon reflexes are symmetric and normal bilaterally.   DIAGNOSTIC DATA (LABS, IMAGING, TESTING) - I reviewed patient records, labs, notes, testing and imaging myself where available.  Lab Results  Component Value Date   WBC 5.9 03/29/2019   HGB 11.4 (L) 03/29/2019   HCT 34.9 (L) 03/29/2019   MCV 117.1 (H) 03/29/2019   PLT 229 03/29/2019      Component Value Date/Time   NA 135 03/29/2019 1721   K 3.7 03/29/2019 1721   CL 92 (L) 03/29/2019 1721   CO2 29 03/29/2019 1721   GLUCOSE 119 (H) 03/29/2019 1721   BUN 13 03/29/2019 1721   CREATININE 3.66 (H) 03/29/2019 1721   CALCIUM 7.4 (L) 03/29/2019 1721   CALCIUM 7.4 (L) 09/12/2008 0615   PROT 7.3 03/29/2019 1721   ALBUMIN 3.7 03/29/2019 1721   AST 32 03/29/2019 1721   ALT 23 03/29/2019 1721   ALKPHOS 86 03/29/2019 1721   BILITOT 1.1 03/29/2019 1721   GFRNONAA 12 (L) 03/29/2019 1721   GFRAA 14 (L) 03/29/2019 1721   Lab Results  Component Value Date   CHOL 188 12/23/2016   HDL 44 12/23/2016   LDLCALC 100 (H) 12/23/2016   TRIG 218 (H) 12/23/2016   CHOLHDL 4.3 12/23/2016    Lab Results  Component Value Date   HGBA1C 4.5 (L) 12/14/2017   No results found for: VITAMINB12 Lab Results  Component Value Date   TSH 1.690 11/24/2015      ASSESSMENT AND PLAN 68 y.o. year old female  has a past medical history of Anemia, Aortic stenosis, CHF (congestive heart failure) ([place]), Childhood asthma, Chronic upper back pain, Coronary artery disease, Decreased hearing, ESRD (end stage renal disease) on dialysis ([place]), Gait abnormality (08/28/2016), GERD (gastroesophageal reflux disease), Headache, Headache syndrome (01/08/2017), Heart murmur, History of blood transfusion (1996), Hyperlipidemia, Hypertension, Hypothyroidism, IBS (irritable bowel syndrome), Myocardial infarction ([place]) (2004), Osteoarthritis, Pneumonia, Rotator cuff arthropathy of right shoulder, Spondylosis of cervical spine (06/16/2017), Stroke ([place]) (07/2016), and Type II diabetes mellitus ([place]). here with ***   I spent 15 minutes with the patient. 50% of this time was spent   Butler Denmark, Baring, DNP 04/03/2019, 2:29 PM [facility] [address], [place], [place] [address] [phone]

## 2019-04-04 ENCOUNTER — Ambulatory Visit: Payer: Medicare Other | Admitting: Neurology

## 2019-04-04 ENCOUNTER — Encounter: Payer: Self-pay | Admitting: Neurology

## 2019-04-09 ENCOUNTER — Other Ambulatory Visit: Payer: Self-pay | Admitting: Neurology

## 2019-04-10 ENCOUNTER — Encounter: Payer: Self-pay | Admitting: *Deleted

## 2019-04-10 NOTE — Telephone Encounter (Signed)
 Controlled Substance Registry checked. Pt has an appt scheduled next week.

## 2019-04-24 ENCOUNTER — Telehealth: Payer: Self-pay | Admitting: Neurology

## 2019-04-24 NOTE — Telephone Encounter (Signed)
lvm to r/s 11/17 due to NP being out

## 2019-04-25 ENCOUNTER — Ambulatory Visit: Payer: Medicare Other | Admitting: Neurology

## 2019-04-27 ENCOUNTER — Ambulatory Visit: Payer: Medicare Other | Admitting: Cardiology

## 2019-04-30 IMAGING — CT CT HEAD W/O CM
4 series · 17 of 47 positions shown, 19 images · non-contrast
Comparison: 08/13/2016

CLINICAL DATA: Dizziness

EXAM:
CT HEAD WITHOUT CONTRAST
TECHNIQUE: Contiguous axial images were obtained from the base of the skull
through the vertex without intravenous contrast.

[Series 3: head bone · axial · 0.42mm/px · z∈[-79,-27]mm · 4 of 75 slices shown]
[im 8/75  bone]
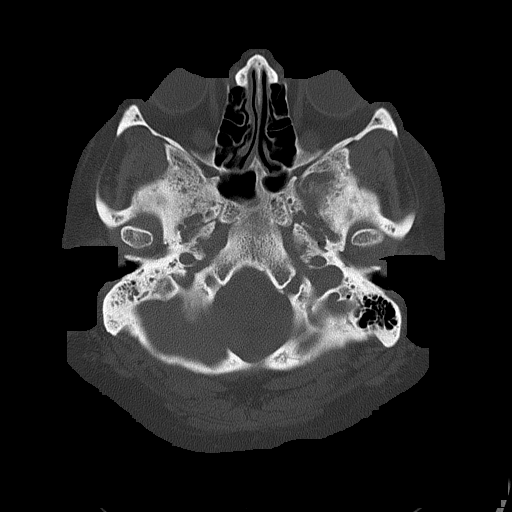
[im 15/75  bone]
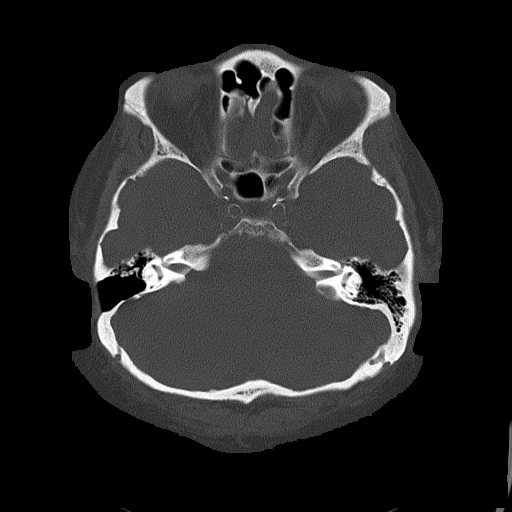
[im 23/75  bone]
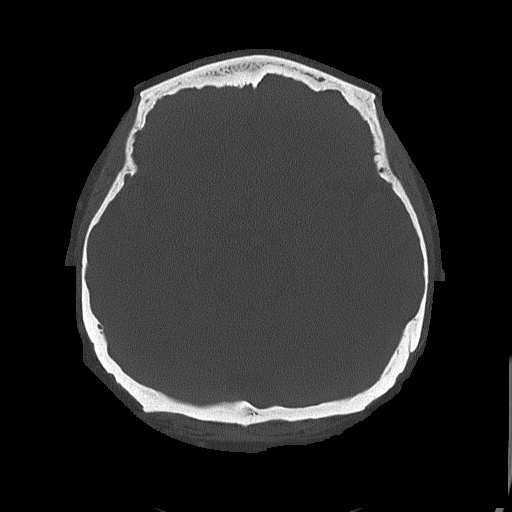
[im 34/75  bone]
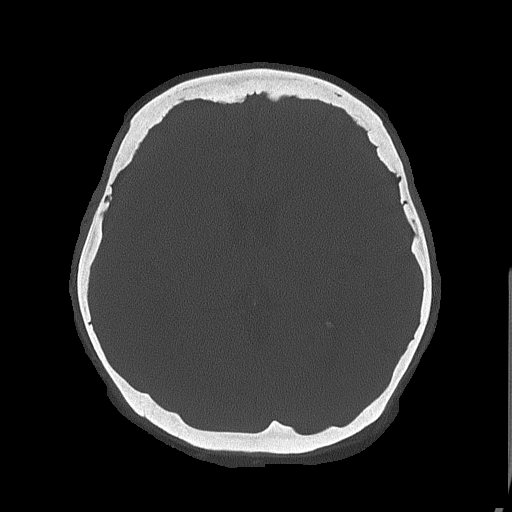

[Series 4: head without cor · coronal · non-contrast · 0.31mm/px · 3 of 66 slices shown]
[im 22/66  brain]
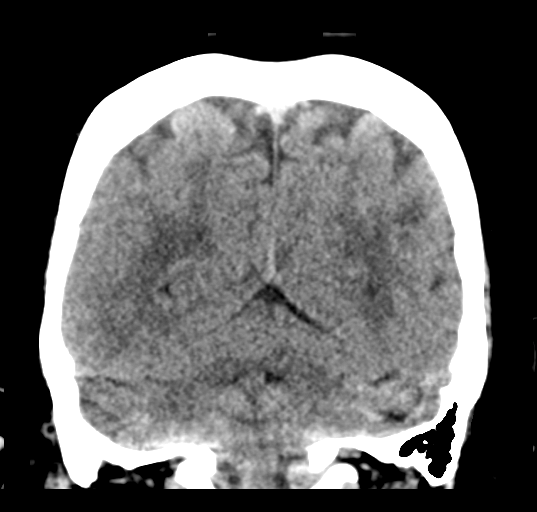
[im 29/66  brain]
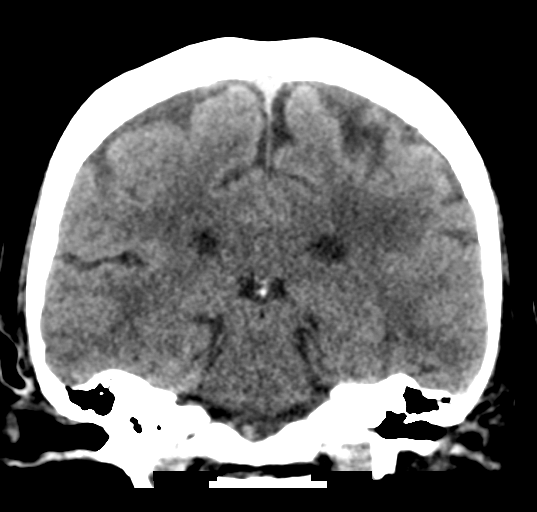
[im 37/66  brain]
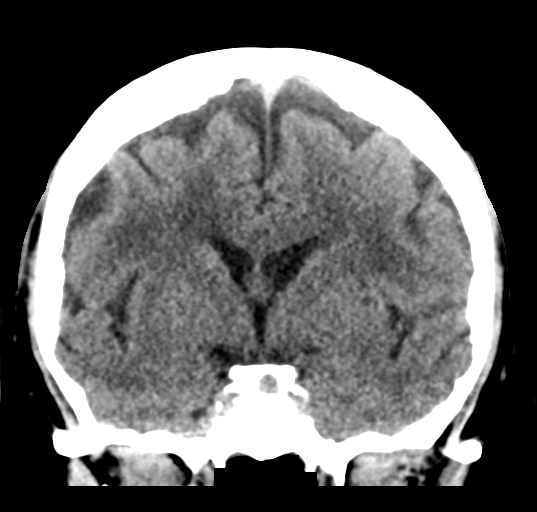

[Series 5: head without sag · sagittal · non-contrast · 0.29mm/px · 3 of 57 slices shown]
[im 19/57  brain]
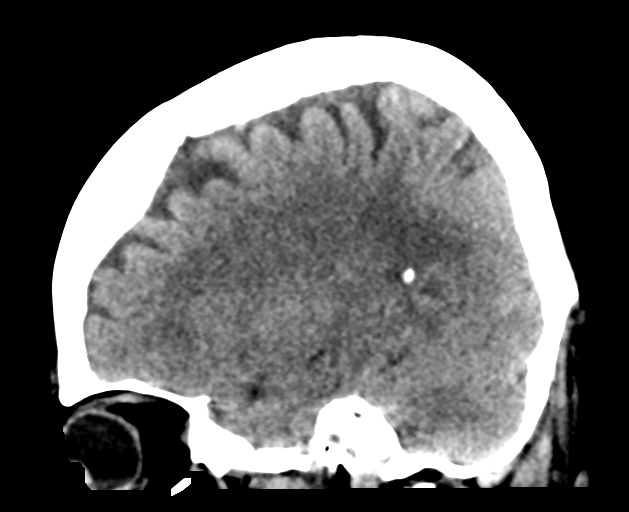
[im 29/57  brain]
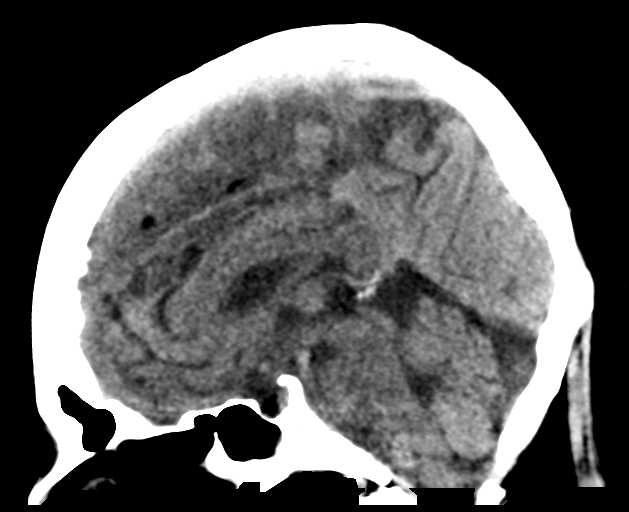
[im 38/57  brain]
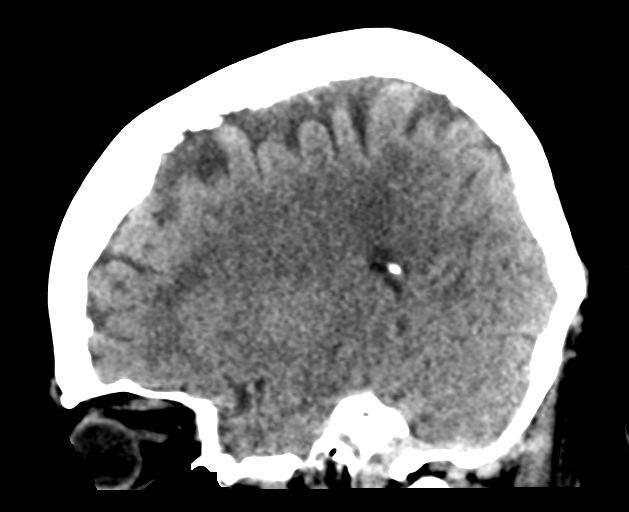

[Series 6: head without · axial · non-contrast · 0.42mm/px · z∈[-78,+32]mm · 7 of 30 slices shown, 9 images]
[im 4/30  brain]
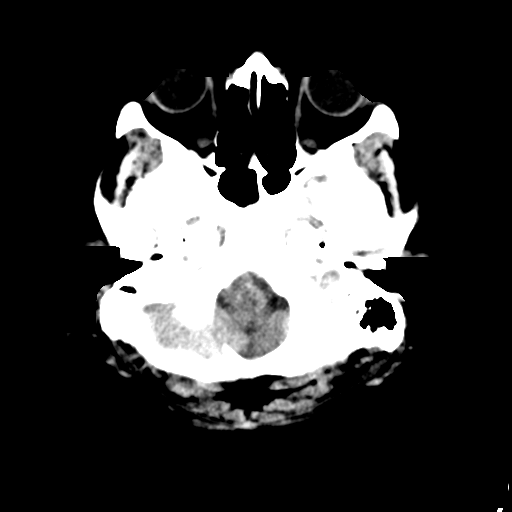
[im 4/30  bone]
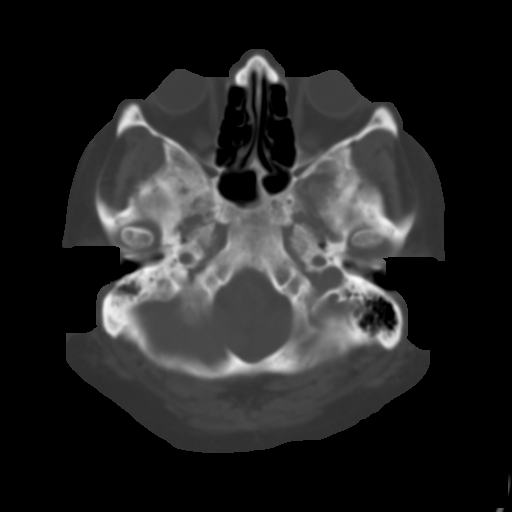
[im 8/30  brain]
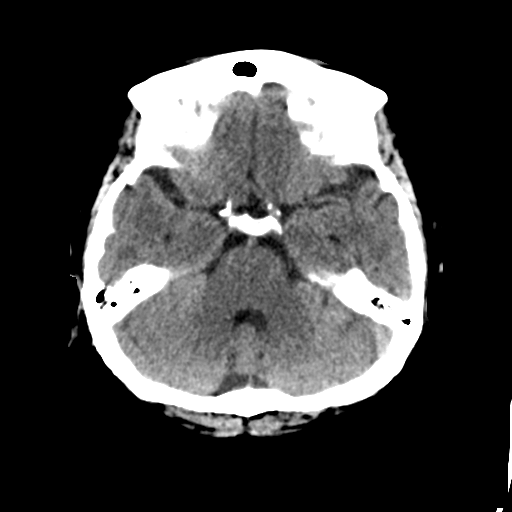
[im 11/30  brain]
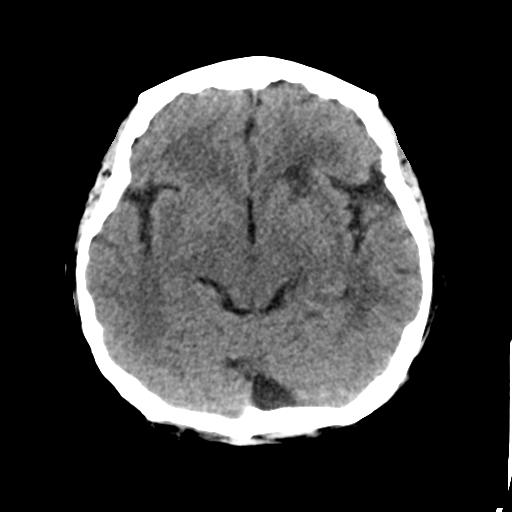
[im 15/30  brain]
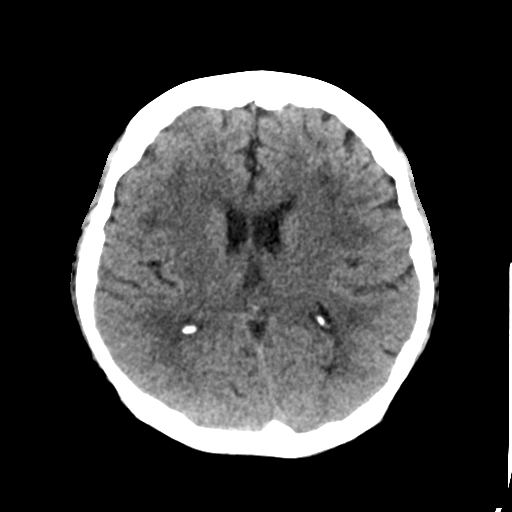
[im 19/30  brain]
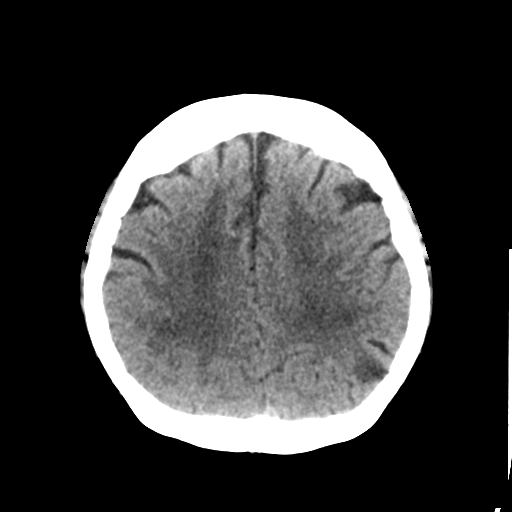
[im 19/30  bone]
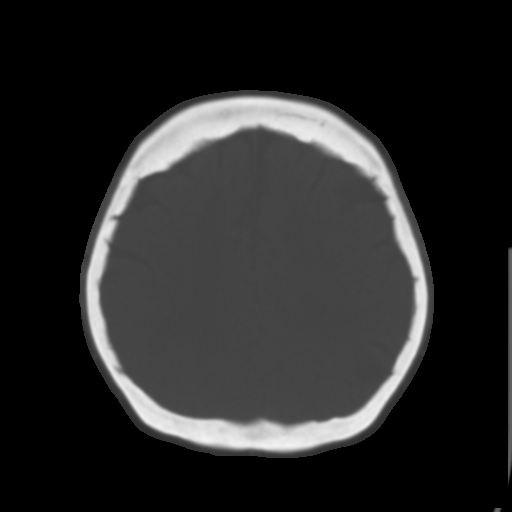
[im 22/30  brain]
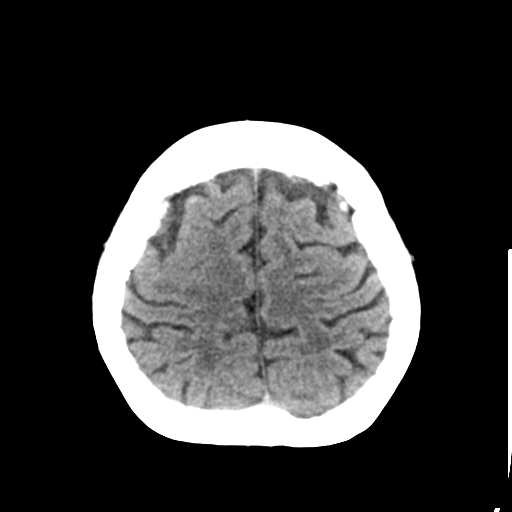
[im 26/30  brain]
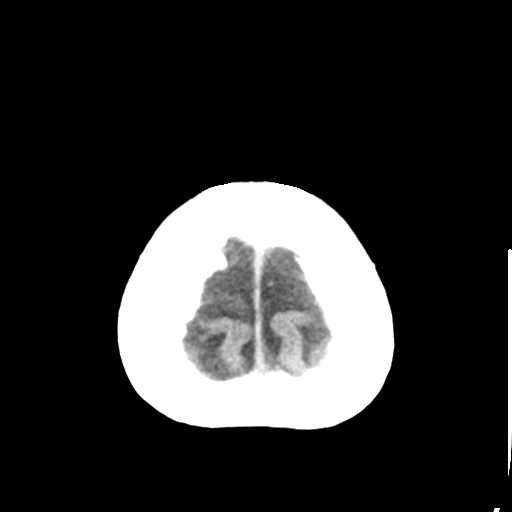

[17 of 47 positions shown; findings below may reference images not displayed]

FINDINGS: Brain: No evidence of acute infarction, hemorrhage, hydrocephalus,
extra-axial collection or mass lesion/mass effect. Mild atrophic
changes and chronic white matter ischemic change is again
identified.

Vascular: No hyperdense vessel or unexpected calcification.

Skull: Normal. Negative for fracture or focal lesion.

Sinuses/Orbits: No acute finding.

Other: None.
IMPRESSION: No acute abnormality noted.

Chronic atrophic and ischemic changes.

## 2019-05-02 ENCOUNTER — Ambulatory Visit: Payer: Medicare Other | Admitting: Cardiology

## 2019-05-08 ENCOUNTER — Other Ambulatory Visit: Payer: Self-pay | Admitting: Gastroenterology

## 2019-05-08 DIAGNOSIS — R197 Diarrhea, unspecified: Secondary | ICD-10-CM

## 2019-05-13 ENCOUNTER — Other Ambulatory Visit: Payer: Self-pay | Admitting: Neurology

## 2019-05-18 ENCOUNTER — Encounter: Payer: Self-pay | Admitting: Gastroenterology

## 2019-05-18 ENCOUNTER — Telehealth (INDEPENDENT_AMBULATORY_CARE_PROVIDER_SITE_OTHER): Payer: Medicare Other | Admitting: Gastroenterology

## 2019-05-18 ENCOUNTER — Other Ambulatory Visit: Payer: Self-pay

## 2019-05-18 VITALS — Ht <= 58 in | Wt 183.0 lb

## 2019-05-18 DIAGNOSIS — R112 Nausea with vomiting, unspecified: Secondary | ICD-10-CM | POA: Diagnosis not present

## 2019-05-18 DIAGNOSIS — R197 Diarrhea, unspecified: Secondary | ICD-10-CM | POA: Diagnosis not present

## 2019-05-18 MED ORDER — PANTOPRAZOLE SODIUM 40 MG PO TBEC: 40.0000 mg | DELAYED_RELEASE_TABLET | Freq: Two times a day (BID) | ORAL | 3 refills | Status: AC

## 2019-05-18 MED ORDER — ONDANSETRON 4 MG PO TBDP: 4.0000 mg | ORAL_TABLET | Freq: Three times a day (TID) | ORAL | 0 refills | Status: AC | PRN

## 2019-05-18 NOTE — Patient Instructions (Signed)
If you are age 68 or older, your body mass index should be between 23-30. Your Body mass index is 38.25 kg/m. If this is out of the aforementioned range listed, please consider follow up with your Primary Care Provider.  If you are age 53 or younger, your body mass index should be between 19-25. Your Body mass index is 38.25 kg/m. If this is out of the aformentioned range listed, please consider follow up with your Primary Care Provider.   We have sent the following medications to your pharmacy for you to pick up at your convenience: Protonix  Zofran  It has been recommended to you by your physician that you have a(n) EGD completed. We did not schedule that procedure today. You will be contacted with that appointment information.   Please go to the lab at [facility] ([place] You will need to go to level "B", you do not need an appointment for this. Hours available are 7:30 am - 4:30 pm.   Thank you,  Dr. Jackquline Denmark

## 2019-05-18 NOTE — Progress Notes (Signed)
Chief Complaint: Diarrhea  Referring Provider:  [name], MD      ASSESSMENT AND PLAN;   #1.  Intermittent N/V. ?  Etiology.  Could be due to ESRD, medications, gastroparesis, ulcers, r/o gastric etiology.  #2. Diarrhea (better, now has occ constipation). Neg colonoscopy with TI and random colonic biopsies 07/2010, EGD 10/2012 showing hyperplastic gastric polyps, negative small bowel biopsies for celiac disease.  Exacerbated by medications.  Element of postcholecystectomy diarrhea. S/p recent shoulder surgery  #2.  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 Needs order. - Change omeprazole to protonix 40mg  po bid. 60, 6 refills. - Zofran 4mg  ODT Q8hrs prn, #20 - EGD at [facility] after cardio clearence Jan/Feb 2021. Earlier if with worsening problems. - For now we will continue rest of the medications. - FU in 12 weeks.  HPI:    Roberta Bryant is a 68 y.o. female    For televisit  N/V with dry heaves. Occ wakes up from sleep.  Sometimes she does throw up food which she has eaten the night before.  Occasional heartburn.  No odynophagia or dysphagia.  "ball in center of stomach" " feels full very fast"  Getting dialysis Monday Wednesday Friday.  Could not tell me who her nephrologist is.    Now with occ constipation alternating with diarrhea.  That certainly has gotten better.  She has been using as needed Imodium.  Does complain of epigastric discomfort and early satiety after eating followed by abdominal bloating followed by diarrhea.  No melena or hematochezia.   Past Medical History:  Diagnosis Date   Anemia    Aortic stenosis    CHF (congestive heart failure) (HCC)    Childhood asthma    Chronic upper back pain    Coronary artery disease    Stent to left main coronary artery second of August 2017   Decreased hearing    ESRD (end stage  renal disease) on dialysis [facility])    "MWF; [facility] in [place]" (01/06/2018)   Gait abnormality 08/28/2016   GERD (gastroesophageal reflux disease)    Headache    "a few times/week" (10/30/2014)   Headache syndrome 01/08/2017   Heart murmur    History of blood transfusion 1996   "related to menses"   Hyperlipidemia    Hypertension    Hypothyroidism    IBS (irritable bowel syndrome)    Myocardial infarction ([place]) 2004   Osteoarthritis    Pneumonia    "years ago"   Rotator cuff arthropathy of right shoulder    Spondylosis of cervical spine 06/16/2017   Stroke ([place]) 07/2016   mini stroke , right side of body weak-    Type II diabetes mellitus ([place])     Past Surgical History:  Procedure Laterality Date   A/V FISTULAGRAM Left 05/31/2018   Procedure: A/V FISTULAGRAM;  Surgeon: Serafina Mitchell, MD;  Location: [place] [facility];  Service: Cardiovascular;  Laterality: Left;   APPENDECTOMY     AV FISTULA PLACEMENT Left 12/03/2006   "forearm"   AV FISTULA REPAIR Left 11/25/2009   "forearm"   CARPAL TUNNEL RELEASE Right 02/11/2017   Procedure: RIGHT CARPAL TUNNEL RELEASE;  Surgeon: Daryll Brod, MD;  Location: [place];  Service: Orthopedics;  Laterality: Right;   CHOLECYSTECTOMY OPEN  1970's   COLONOSCOPY W/ POLYPECTOMY  07/30/2010   Minimal sigmoid  diverticulosis. Small internal hemorrhoids. Otherwise normal colonoscopy to TI.    CORONARY ANGIOPLASTY     CORONARY ANGIOPLASTY WITH STENT PLACEMENT     ESOPHAGOGASTRODUODENOSCOPY  10/21/2012   Gastric polyps- status post polypectomy. Mild gastritis.    INSERTION OF DIALYSIS CATHETER Right 2010   "chest"   PARATHYROIDECTOMY  2010?   Parathyroid autotransplantation    REVERSE SHOULDER ARTHROPLASTY Right 01/06/2018   REVERSE SHOULDER ARTHROPLASTY Right 01/06/2018   Procedure: RIGHT REVERSE SHOULDER ARTHROPLASTY;  Surgeon: [name], MD;  Location: [place];  Service: Orthopedics;   Laterality: Right;   REVISON OF ARTERIOVENOUS FISTULA Left 12/04/2013   Procedure: REVISON OF ARTERIOVENOUS FISTULA;  Surgeon: Rosetta Posner, MD;  Location: [place];  Service: Vascular;  Laterality: Left;   REVISON OF ARTERIOVENOUS FISTULA Left 01/08/5052   Procedure: PLICATION OF LEFT BRACHIOCEPHALIC  ARTERIOVENOUS FISTULA;  Surgeon: Conrad Helena, MD;  Location: [place];  Service: Vascular;  Laterality: Left;   SHOULDER ARTHROSCOPY WITH ROTATOR CUFF REPAIR AND SUBACROMIAL DECOMPRESSION Right 06/22/2017   Procedure: RIGHT SHOULDER ARTHROSCOPY WITH EXTENSIVE DEBRIDEMENT;  Surgeon: Mcarthur Rossetti, MD;  Location: [place];  Service: Orthopedics;  Laterality: Right;   TRIGGER FINGER RELEASE Right 10/22/2017   Procedure: RELEASE RIGHT INDEX TRIGGER FINGER/A-1 PULLEY;  Surgeon: Daryll Brod, MD;  Location: [place];  Service: Orthopedics;  Laterality: Right;   TUBAL LIGATION  9767   UMBILICAL HERNIA REPAIR  72's X 2    Family History  Problem Relation Age of Onset   Epilepsy Mother    Diabetes Mother    Cancer Mother    Alcohol abuse Father     Social History   Tobacco Use   Smoking status: Former Smoker    Years: 3.00    Types: Cigarettes   Smokeless tobacco: Never Used   Tobacco comment: "stopped smoking in the 1970's"  Substance Use Topics   Alcohol use: No   Drug use: Not Currently    Types: Marijuana, Hydromorphone    Comment: as a teenager    Current Outpatient Medications  Medication Sig Dispense Refill   acetaminophen (TYLENOL) 500 MG tablet Take 500-1,000 mg by mouth every 6 (six) hours as needed (FOR PAIN.).     aspirin EC 81 MG tablet Take 81 mg by mouth daily.      carvedilol (COREG) 6.25 MG tablet Take 6.25 mg by mouth 2 (two) times daily with a meal.     clonazePAM (KLONOPIN) 0.5 MG tablet TAKE 1 TABLET BY MOUTH AT BEDTIME 90 tablet 1   dimenhyDRINATE (DRAMAMINE) 50 MG tablet Take 50 mg by mouth every 8 (eight) hours as needed (for dizziness/vertigo).      ezetimibe (ZETIA) 10 MG tablet Take 10 mg by mouth every evening.   1   fenofibrate 160 MG tablet Take 160 mg by mouth every evening.   1   folic acid-vitamin b complex-vitamin c-selenium-zinc (DIALYVITE) 3 MG TABS tablet Take 1 tablet by mouth daily.     HYDROcodone-acetaminophen (NORCO/VICODIN) 5-325 MG tablet Take 1-2 tablets by mouth every 6 (six) hours as needed for severe pain. 10 tablet 0   Lanthanum Carbonate (FOSRENOL) 1000 MG PACK Take 2 packets by mouth 3 (three) times daily.     levothyroxine (SYNTHROID, LEVOTHROID) 50 MCG tablet Take 50 mcg by mouth daily before breakfast.      loperamide (IMODIUM A-D) 2 MG tablet Take 2-4 mg by mouth every Monday, Wednesday, and Friday with hemodialysis.      nortriptyline (PAMELOR)  10 MG capsule Take 2 capsules (20 mg total) by mouth at bedtime. 180 capsule 1   omeprazole (PRILOSEC) 20 MG capsule Take 20 mg by mouth 2 (two) times daily.      VICTOZA 18 MG/3ML SOPN Inject 18 mg into the skin at bedtime.      escitalopram (LEXAPRO) 10 MG tablet TAKE 1 TABLET BY MOUTH ONCE DAILY FOR 30 DAYS     finasteride (PROSCAR) 5 MG tablet Take 2.5 mg by mouth daily at 12 noon.      lidocaine-prilocaine (EMLA) cream Apply 1 application topically See admin instructions. Apply a small amount to access site 1-2 hours before dialysis--cover with occlusive dressing  12   Vitamin D, Ergocalciferol, (DRISDOL) 1.25 MG (50000 UT) CAPS capsule Take 50,000 Units by mouth every 30 (thirty) days.     No current facility-administered medications for this visit.    Allergies  Allergen Reactions   Sulfa Antibiotics Anaphylaxis   Amoxicillin Hives    Has patient had a PCN reaction causing immediate rash, facial/tongue/throat swelling, SOB or lightheadedness with hypotension: No Has patient had a PCN reaction causing severe rash involving mucus membranes or skin necrosis: No Has patient had a PCN reaction that required hospitalization: No Has patient had a  PCN reaction occurring within the last 10 years: Unknown If all of the above answers are "NO", then may proceed with Cephalosporin use.    Crestor [Rosuvastatin Calcium] Other (See Comments)    Dizziness, couldn't walk   Ibuprofen Hives   Naldecon Senior [Guaifenesin] Hives   Statins Hives   Chlorphen-Phenyleph-Asa Rash   Gabapentin Other (See Comments)    Dizzy    Review of Systems:  Neg except for anxiety/depression Focal weakness due to CVA Has generalized osteoarthritis Shoulder pain despite of shoulder surgery      Physical Exam:    Ht 4\' 10"  (1.473 m)    Wt 183 lb (83 kg)    BMI 38.25 kg/m  Filed Weights   05/18/19 0823  Weight: 183 lb (83 kg)   Not examined since it was a televisit CBC Latest Ref Rng & Units 03/29/2019 03/06/2019 02/28/2019  WBC 4.0 - 10.5 K/uL 5.9 6.5 -  Hemoglobin 12.0 - 15.0 g/dL 11.4(L) 10.2(L) 13.9  Hematocrit 36.0 - 46.0 % 34.9(L) 30.1(L) 41.0  Platelets 150 - 400 K/uL 229 181 -   CMP Latest Ref Rng & Units 03/29/2019 03/06/2019 02/28/2019  Glucose 70 - 99 mg/dL 119(H) 126(H) 86  BUN 8 - 23 mg/dL 13 61(H) 29(H)  Creatinine 0.44 - 1.00 mg/dL 3.66(H) 10.32(H) 6.90(H)  Sodium 135 - 145 mmol/L 135 136 137  Potassium 3.5 - 5.1 mmol/L 3.7 5.5(H) 5.0  Chloride 98 - 111 mmol/L 92(L) 98 97(L)  CO2 22 - 32 mmol/L 29 25 -  Calcium 8.9 - 10.3 mg/dL 7.4(L) 7.3(L) -  Total Protein 6.5 - 8.1 g/dL 7.3 - -  Total Bilirubin 0.3 - 1.2 mg/dL 1.1 - -  Alkaline Phos 38 - 126 U/L 86 - -  AST 15 - 41 U/L 32 - -  ALT 0 - 44 U/L 23 - -   I connected with  Roberta Bryant on 05/18/19 by a video enabled telemedicine application and verified that I am speaking with the correct person using two identifiers.   I discussed the limitations of evaluation and management by telemedicine. The patient expressed understanding and agreed to proceed.     Carmell Austria, MD 05/18/2019, 9:40 AM  Cc: [name],  MD

## 2019-05-30 ENCOUNTER — Other Ambulatory Visit: Payer: Self-pay

## 2019-05-30 ENCOUNTER — Ambulatory Visit (INDEPENDENT_AMBULATORY_CARE_PROVIDER_SITE_OTHER): Payer: Medicare Other | Admitting: Neurology

## 2019-05-30 ENCOUNTER — Encounter: Payer: Self-pay | Admitting: Neurology

## 2019-05-30 VITALS — BP 92/63 | HR 70 | Temp 97.2°F | Ht <= 58 in

## 2019-05-30 DIAGNOSIS — I639 Cerebral infarction, unspecified: Secondary | ICD-10-CM | POA: Diagnosis not present

## 2019-05-30 DIAGNOSIS — R269 Unspecified abnormalities of gait and mobility: Secondary | ICD-10-CM

## 2019-05-30 DIAGNOSIS — N186 End stage renal disease: Secondary | ICD-10-CM

## 2019-05-30 NOTE — Progress Notes (Signed)
PATIENT: Roberta Bryant DOB: [birth year]  REASON FOR VISIT: follow up HISTORY FROM: patient  HISTORY OF PRESENT ILLNESS: Today 05/30/19  Roberta Bryant is a 68 year old Hispanic female with a history degenerative arthritis affecting the left knee, back pain and pain on the right leg.  When last seen in March, she was sent for physical therapy, but reports it was not helpful.  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  She also has history of headaches, is taking nortriptyline.  She was in the ER 03/30/2019 for sudden onset of right upper extremity numbness during dialysis treatment.  Her symptoms lasted for 1 hour, at the same time she started having posterior headache and neck pain. There was no slurred speech or vision change.  She was given Tylenol with significant improvement.  MRI was negative for acute infarct.  She continues to live with her son. She uses a wheelchair, is able to pivot from position to position. She is able to feed herself.  She remains on hemodialysis, 3 days a week.  Overall, she feels she has generalized weakness, always feels weaker the day after dialysis.  She has prior history of stroke, reports residual right-sided weakness.  She requires assistance with her ADLs.  She has chronic left knee pain.  She indicates she may have 3 mild headaches a week, and will take Tylenol with benefit.  She has not had any falls.  She says she sleeps well at night.  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  She presents today for evaluation accompanied by her son.  HISTORY 08/09/2018 Dr. Jannifer Franklin: Ms. Roberta Bryant is a 68 year old right-handed Hispanic female with a history of degenerative arthritis affecting the left knee.  She also has some back pain and pain down the right leg to the knee.  This generally comes on with weightbearing.  The patient has a walker but it is too tall for her.  She has not had any recent physical therapy for her walking.  The patient has  mainly been restricted to a wheelchair to get around, she does minimal ambulation at this point.  She returns to this office for an evaluation.   REVIEW OF SYSTEMS: Out of a complete 14 system review of symptoms, the patient complains only of the following symptoms, and all other reviewed systems are negative.  Weakness, headache  ALLERGIES: Allergies  Allergen Reactions  . Sulfa Antibiotics Anaphylaxis  . Amoxicillin Hives    Has patient had a PCN reaction causing immediate rash, facial/tongue/throat swelling, SOB or lightheadedness with hypotension: No Has patient had a PCN reaction causing severe rash involving mucus membranes or skin necrosis: No Has patient had a PCN reaction that required hospitalization: No Has patient had a PCN reaction occurring within the last 10 years: Unknown If all of the above answers are "NO", then may proceed with Cephalosporin use.   Marland Kitchen Crestor [Rosuvastatin Calcium] Other (See Comments)    Dizziness, couldn't walk  . Ibuprofen Hives  . Naldecon Senior [employer]  . Statins Hives  . Chlorphen-Phenyleph-Asa Rash  . Gabapentin Other (See Comments)    Dizzy    HOME MEDICATIONS: Outpatient Medications Prior to Visit  Medication Sig Dispense Refill  . acetaminophen (TYLENOL) 500 MG tablet Take 500-1,000 mg by mouth every 6 (six) hours as needed (FOR PAIN.).    Marland Kitchen aspirin EC 81 MG tablet Take 81 mg by mouth daily.     . carvedilol (COREG) 6.25 MG tablet Take 6.25 mg by  mouth 2 (two) times daily with a meal.    . clonazePAM (KLONOPIN) 0.5 MG tablet TAKE 1 TABLET BY MOUTH AT BEDTIME 90 tablet 1  . dimenhyDRINATE (DRAMAMINE) 50 MG tablet Take 50 mg by mouth every 8 (eight) hours as needed (for dizziness/vertigo).    . ezetimibe (ZETIA) 10 MG tablet Take 10 mg by mouth every evening.   1  . fenofibrate 160 MG tablet Take 160 mg by mouth every evening.   1  . finasteride (PROSCAR) 5 MG tablet Take 2.5 mg by mouth daily at 12 noon.     Marland Kitchen  HYDROcodone-acetaminophen (NORCO/VICODIN) 5-325 MG tablet Take 1-2 tablets by mouth every 6 (six) hours as needed for severe pain. 10 tablet 0  . Lanthanum Carbonate (FOSRENOL) 1000 MG PACK Take 2 packets by mouth 3 (three) times daily.    Marland Kitchen levothyroxine (SYNTHROID, LEVOTHROID) 50 MCG tablet Take 50 mcg by mouth daily before breakfast.     . lidocaine-prilocaine (EMLA) cream Apply 1 application topically See admin instructions. Apply a small amount to access site 1-2 hours before dialysis--cover with occlusive dressing  12  . loperamide (IMODIUM A-D) 2 MG tablet Take 2-4 mg by mouth every Monday, Wednesday, and Friday with hemodialysis.     Marland Kitchen nortriptyline (PAMELOR) 10 MG capsule Take 2 capsules (20 mg total) by mouth at bedtime. 180 capsule 1  . pantoprazole (PROTONIX) 40 MG tablet Take 1 tablet (40 mg total) by mouth 2 (two) times daily. 60 tablet 3  . Vitamin D, Ergocalciferol, (DRISDOL) 1.25 MG (50000 UT) CAPS capsule Take 50,000 Units by mouth every 30 (thirty) days.    Marland Kitchen escitalopram (LEXAPRO) 10 MG tablet TAKE 1 TABLET BY MOUTH ONCE DAILY FOR 30 DAYS    . folic acid-vitamin b complex-vitamin c-selenium-zinc (DIALYVITE) 3 MG TABS tablet Take 1 tablet by mouth daily.    . ondansetron (ZOFRAN ODT) 4 MG disintegrating tablet Take 1 tablet (4 mg total) by mouth every 8 (eight) hours as needed for nausea or vomiting. (Patient not taking: Reported on 05/30/2019) 20 tablet 0  . VICTOZA 18 MG/3ML SOPN Inject 18 mg into the skin at bedtime.     Marland Kitchen omeprazole (PRILOSEC) 20 MG capsule Take 20 mg by mouth 2 (two) times daily.      No facility-administered medications prior to visit.    PAST MEDICAL HISTORY: Past Medical History:  Diagnosis Date  . Anemia   . Aortic stenosis   . CHF (congestive heart failure) ([place])   . Childhood asthma   . Chronic upper back pain   . Coronary artery disease    Stent to left main coronary artery second of August 2017  . Decreased hearing   . ESRD (end stage renal  disease) on dialysis [facility])    "MWF; [facility] in [place]" (01/06/2018)  . Gait abnormality 08/28/2016  . GERD (gastroesophageal reflux disease)   . Headache    "a few times/week" (10/30/2014)  . Headache syndrome 01/08/2017  . Heart murmur   . History of blood transfusion 1996   "related to menses"  . Hyperlipidemia   . Hypertension   . Hypothyroidism   . IBS (irritable bowel syndrome)   . Myocardial infarction ([place]) 2004  . Osteoarthritis   . Pneumonia    "years ago"  . Rotator cuff arthropathy of right shoulder   . Spondylosis of cervical spine 06/16/2017  . Stroke ([place]) 07/2016   mini stroke , right side of body weak-   . Type II diabetes  mellitus ([place])     PAST SURGICAL HISTORY: Past Surgical History:  Procedure Laterality Date  . A/V FISTULAGRAM Left 05/31/2018   Procedure: A/V FISTULAGRAM;  Surgeon: Serafina Mitchell, MD;  Location: [facility];  Service: Cardiovascular;  Laterality: Left;  . APPENDECTOMY    . AV FISTULA PLACEMENT Left 12/03/2006   "forearm"  . AV FISTULA REPAIR Left 11/25/2009   "forearm"  . CARPAL TUNNEL RELEASE Right 02/11/2017   Procedure: RIGHT CARPAL TUNNEL RELEASE;  Surgeon: Daryll Brod, MD;  Location: [facility];  Service: Orthopedics;  Laterality: Right;  . CHOLECYSTECTOMY OPEN  1970's  . COLONOSCOPY W/ POLYPECTOMY  07/30/2010   Minimal sigmoid diverticulosis. Small internal hemorrhoids. Otherwise normal colonoscopy to TI.   Marland Kitchen CORONARY ANGIOPLASTY    . CORONARY ANGIOPLASTY WITH STENT PLACEMENT    . ESOPHAGOGASTRODUODENOSCOPY  10/21/2012   Gastric polyps- status post polypectomy. Mild gastritis.   . INSERTION OF DIALYSIS CATHETER Right 2010   "chest"  . PARATHYROIDECTOMY  2010?   Parathyroid autotransplantation   . REVERSE SHOULDER ARTHROPLASTY Right 01/06/2018  . REVERSE SHOULDER ARTHROPLASTY Right 01/06/2018   Procedure: RIGHT REVERSE SHOULDER ARTHROPLASTY;  Surgeon: [name], MD;  Location: [facility];  Service:  Orthopedics;  Laterality: Right;  . REVISON OF ARTERIOVENOUS FISTULA Left 12/04/2013   Procedure: REVISON OF ARTERIOVENOUS FISTULA;  Surgeon: Rosetta Posner, MD;  Location: [facility];  Service: Vascular;  Laterality: Left;  . REVISON OF ARTERIOVENOUS FISTULA Left 08/09/5454   Procedure: PLICATION OF LEFT BRACHIOCEPHALIC  ARTERIOVENOUS FISTULA;  Surgeon: Conrad San Castle, MD;  Location: [facility];  Service: Vascular;  Laterality: Left;  . SHOULDER ARTHROSCOPY WITH ROTATOR CUFF REPAIR AND SUBACROMIAL DECOMPRESSION Right 06/22/2017   Procedure: RIGHT SHOULDER ARTHROSCOPY WITH EXTENSIVE DEBRIDEMENT;  Surgeon: Mcarthur Rossetti, MD;  Location: [facility];  Service: Orthopedics;  Laterality: Right;  . TRIGGER FINGER RELEASE Right 10/22/2017   Procedure: RELEASE RIGHT INDEX TRIGGER FINGER/A-1 PULLEY;  Surgeon: Daryll Brod, MD;  Location: [facility];  Service: Orthopedics;  Laterality: Right;  . TUBAL LIGATION  1989  . UMBILICAL HERNIA REPAIR  1990's X 2    FAMILY HISTORY: Family History  Problem Relation Age of Onset  . Epilepsy Mother   . Diabetes Mother   . Cancer Mother   . Alcohol abuse Father     SOCIAL HISTORY: Social History   Socioeconomic History  . Marital status: Divorced    Spouse name: Not on file  . Number of children: 4  . Years of education: Some college  . Highest education level: Not on file  Occupational History  . Occupation: Disabled  Tobacco Use  . Smoking status: Former Smoker    Years: 3.00    Types: Cigarettes  . Smokeless tobacco: Never Used  . Tobacco comment: "stopped smoking in the 1970's"  Substance and Sexual Activity  . Alcohol use: No  . Drug use: Not Currently    Types: Marijuana, Hydromorphone    Comment: as a teenager  . Sexual activity: Not Currently  Other Topics Concern  . Not on file  Social History Narrative   Lives at home with sons   Caffeine use: Coffee-decaf   Right-handed   Social Determinants of Health   Financial Resource Strain:   .  Difficulty of Paying Living Expenses: Not on file  Food Insecurity:   . Worried About Charity fundraiser in the Last Year: Not on file  . Ran Out of Food in the Last  Year: Not on file  Transportation Needs:   . Lack of Transportation (Medical): Not on file  . Lack of Transportation (Non-Medical): Not on file  Physical Activity:   . Days of Exercise per Week: Not on file  . Minutes of Exercise per Session: Not on file  Stress:   . Feeling of Stress : Not on file  Social Connections:   . Frequency of Communication with Friends and Family: Not on file  . Frequency of Social Gatherings with Friends and Family: Not on file  . Attends Religious Services: Not on file  . Active Member of Clubs or Organizations: Not on file  . Attends Archivist Meetings: Not on file  . Marital Status: Not on file  Intimate Partner Violence:   . Fear of Current or Ex-Partner: Not on file  . Emotionally Abused: Not on file  . Physically Abused: Not on file  . Sexually Abused: Not on file   PHYSICAL EXAM  Vitals:   05/30/19 0928  BP: 92/63  Pulse: 70  Temp: (!) 97.2 F (36.2 C)  Height: 4\' 10"  (1.473 m)   Body mass index is 38.25 kg/m.  Generalized: Well developed, in no acute distress   Neurological examination  Mentation: Alert oriented to time, place, history taking. Follows all commands speech and language fluent Cranial nerve II-XII: Pupils were equal round reactive to light. Extraocular movements were full, visual field were full on confrontational test. Facial sensation and strength were normal.  Head turning and shoulder shrug  were normal and symmetric. Motor: Moderate strength of all extremities, equal handgrips bilaterally, good dorsiflexion bilaterally Sensory: Sensory testing is intact to soft touch on all 4 extremities. No evidence of extinction is noted.  Coordination: Cerebellar testing reveals good finger-nose-finger bilaterally Gait and station: Has to rock from seated  position to stand, able to stand for a few moments, then feels she needs to sit back down  Reflexes: Deep tendon reflexes are symmetric   DIAGNOSTIC DATA (LABS, IMAGING, TESTING) - I reviewed patient records, labs, notes, testing and imaging myself where available.  Lab Results  Component Value Date   WBC 5.9 03/29/2019   HGB 11.4 (L) 03/29/2019   HCT 34.9 (L) 03/29/2019   MCV 117.1 (H) 03/29/2019   PLT 229 03/29/2019      Component Value Date/Time   NA 135 03/29/2019 1721   K 3.7 03/29/2019 1721   CL 92 (L) 03/29/2019 1721   CO2 29 03/29/2019 1721   GLUCOSE 119 (H) 03/29/2019 1721   BUN 13 03/29/2019 1721   CREATININE 3.66 (H) 03/29/2019 1721   CALCIUM 7.4 (L) 03/29/2019 1721   CALCIUM 7.4 (L) 09/12/2008 0615   PROT 7.3 03/29/2019 1721   ALBUMIN 3.7 03/29/2019 1721   AST 32 03/29/2019 1721   ALT 23 03/29/2019 1721   ALKPHOS 86 03/29/2019 1721   BILITOT 1.1 03/29/2019 1721   GFRNONAA 12 (L) 03/29/2019 1721   GFRAA 14 (L) 03/29/2019 1721   Lab Results  Component Value Date   CHOL 188 12/23/2016   HDL 44 12/23/2016   LDLCALC 100 (H) 12/23/2016   TRIG 218 (H) 12/23/2016   CHOLHDL 4.3 12/23/2016   Lab Results  Component Value Date   HGBA1C 4.5 (L) 12/14/2017   No results found for: [id number] Lab Results  Component Value Date   TSH 1.690 11/24/2015      ASSESSMENT AND PLAN 68 y.o. year old female  has a past medical history of Anemia, Aortic stenosis,  CHF (congestive heart failure) ([place]), Childhood asthma, Chronic upper back pain, Coronary artery disease, Decreased hearing, ESRD (end stage renal disease) on dialysis [facility]), Gait abnormality (08/28/2016), GERD (gastroesophageal reflux disease), Headache, Headache syndrome (01/08/2017), Heart murmur, History of blood transfusion (1996), Hyperlipidemia, Hypertension, Hypothyroidism, IBS (irritable bowel syndrome), Myocardial infarction ([place]) (2004), Osteoarthritis, Pneumonia, Rotator cuff arthropathy of right shoulder,  Spondylosis of cervical spine (06/16/2017), Stroke ([place]) (07/2016), and Type II diabetes mellitus ([place]). here with:  1.  Gait disturbance 2.  Left knee arthritis 3.  Stroke 4.  Weakness 5.  End-stage renal disease on hemodialysis  She complains of overall generalized weakness, no focal weakness with identified.  MRI of the brain in October 2020 did not show acute stroke (reviewed scans with Dr. Jannifer Franklin, see attached reports below), CTA head and neck showed atherosclerotic change proximal ICAs bilaterally 55% on the right, 50% on the left, likely would not explain her overall weakness, which I suspect may be coming from ESRD on dialysis, and deconditioning, possible low BP.  She will remain on aspirin 81 mg daily, she should continue to follow with her primary doctor for management of vascular risk factors.  Her blood pressure was noted to be lower end, 92/63, could also be a factor in her weakness, she should discuss this with her primary doctor, kidney doctor.  I will order home health evaluation, as I think she could benefit from a home health aide, and evaluation for physical therapy. She is alone during the day as her son works 3rd shift and is sleeping.  She will follow-up in 6 months or sooner if needed.  I did advise if her symptoms worsen or she develops any new symptoms she should let us know. She tells me she does not need refills.   MRI of the brain 03/30/2019 IMPRESSION: 1. No acute intracranial infarct or other abnormality identified. 2. Small remote left temporal occipital cortical infarct, with additional chronic left basal ganglia lacunar infarct. 3. Underlying age-related cerebral atrophy with advanced chronic microvascular ischemic disease.  CTA Head/Neck IMPRESSION: 1. Negative CTA for emergent large vessel occlusion. 2. Atherosclerotic change about the proximal ICAs bilaterally, with associated stenoses of up to 55% on the right and 50% on the left. 3. Occlusion of the right  vertebral artery at its origin. Dominant left vertebral artery widely patent without hemodynamically significant stenosis.  I spent 25 minutes with the patient. 50% of this time was spent discussing her plan of care.   Butler Denmark, AGNP-C, DNP 05/30/2019, 10:10 AM [facility] [address], [place], [place] [address] [phone]

## 2019-05-30 NOTE — Progress Notes (Signed)
I have read the note, and I agree with the clinical assessment and plan.  Roberta Bryant Roberta Bryant   

## 2019-05-30 NOTE — Patient Instructions (Addendum)
I am going to order home evaluation   I will review your scans with Dr. Jannifer Franklin   Continue nortriptyline for headaches, klonopin for muscles spasms  Talk with your primary doctor about your BP, was lower today 92/63, which could contribute to your weakness   Return in 6 months or sooner if needed

## 2019-06-06 ENCOUNTER — Emergency Department (HOSPITAL_COMMUNITY): Payer: Medicare Other

## 2019-06-06 ENCOUNTER — Emergency Department (HOSPITAL_COMMUNITY)
Admission: EM | Admit: 2019-06-06 | Discharge: 2019-06-07 | Disposition: A | Discharge status: Home / Self Care | Payer: Medicare Other | Attending: Emergency Medicine | Admitting: Emergency Medicine

## 2019-06-06 DIAGNOSIS — R4781 Slurred speech: Secondary | ICD-10-CM | POA: Diagnosis not present

## 2019-06-06 DIAGNOSIS — I132 Hypertensive heart and chronic kidney disease with heart failure and with stage 5 chronic kidney disease, or end stage renal disease: Secondary | ICD-10-CM | POA: Diagnosis not present

## 2019-06-06 DIAGNOSIS — I509 Heart failure, unspecified: Secondary | ICD-10-CM | POA: Insufficient documentation

## 2019-06-06 DIAGNOSIS — Z992 Dependence on renal dialysis: Secondary | ICD-10-CM | POA: Insufficient documentation

## 2019-06-06 DIAGNOSIS — E1122 Type 2 diabetes mellitus with diabetic chronic kidney disease: Secondary | ICD-10-CM | POA: Insufficient documentation

## 2019-06-06 DIAGNOSIS — N186 End stage renal disease: Secondary | ICD-10-CM | POA: Insufficient documentation

## 2019-06-06 DIAGNOSIS — R519 Headache, unspecified: Secondary | ICD-10-CM | POA: Diagnosis present

## 2019-06-06 DIAGNOSIS — R202 Paresthesia of skin: Secondary | ICD-10-CM | POA: Diagnosis not present

## 2019-06-06 DIAGNOSIS — Z79899 Other long term (current) drug therapy: Secondary | ICD-10-CM | POA: Insufficient documentation

## 2019-06-06 DIAGNOSIS — Z7982 Long term (current) use of aspirin: Secondary | ICD-10-CM | POA: Insufficient documentation

## 2019-06-06 DIAGNOSIS — E039 Hypothyroidism, unspecified: Secondary | ICD-10-CM | POA: Diagnosis not present

## 2019-06-06 DIAGNOSIS — Z87891 Personal history of nicotine dependence: Secondary | ICD-10-CM | POA: Insufficient documentation

## 2019-06-06 DIAGNOSIS — I252 Old myocardial infarction: Secondary | ICD-10-CM | POA: Insufficient documentation

## 2019-06-06 LAB — COMPREHENSIVE METABOLIC PANEL
ALT: 15 U/L (ref 0–44)
AST: 27 U/L (ref 15–41)
Albumin: 2.8 g/dL — ABNORMAL LOW (ref 3.5–5.0)
Alkaline Phosphatase: 66 U/L (ref 38–126)
Anion gap: 14 (ref 5–15)
BUN: 24 mg/dL — ABNORMAL HIGH (ref 8–23)
CO2: 21 mmol/L — ABNORMAL LOW (ref 22–32)
Calcium: 8.3 mg/dL — ABNORMAL LOW (ref 8.9–10.3)
Chloride: 104 mmol/L (ref 98–111)
Creatinine, Ser: 5.88 mg/dL — ABNORMAL HIGH (ref 0.44–1.00)
GFR calc Af Amer: 8 mL/min — ABNORMAL LOW (ref >60)
GFR calc non Af Amer: 7 mL/min — ABNORMAL LOW (ref >60)
Glucose, Bld: 145 mg/dL — ABNORMAL HIGH (ref 70–99)
Potassium: 4.1 mmol/L (ref 3.5–5.1)
Sodium: 139 mmol/L (ref 135–145)
Total Bilirubin: 1 mg/dL (ref 0.3–1.2)
Total Protein: 6.4 g/dL — ABNORMAL LOW (ref 6.5–8.1)

## 2019-06-06 LAB — CBC
HCT: 32.1 % — ABNORMAL LOW (ref 36.0–46.0)
Hemoglobin: 10 g/dL — ABNORMAL LOW (ref 12.0–15.0)
MCH: 38.2 pg — ABNORMAL HIGH (ref 26.0–34.0)
MCHC: 31.2 g/dL (ref 30.0–36.0)
MCV: 122.5 fL — ABNORMAL HIGH (ref 80.0–100.0)
Platelets: 203 10*3/uL (ref 150–400)
RBC: 2.62 MIL/uL — ABNORMAL LOW (ref 3.87–5.11)
RDW: 15 % (ref 11.5–15.5)
WBC: 5 10*3/uL (ref 4.0–10.5)
nRBC: 0 % (ref 0.0–0.2)

## 2019-06-06 LAB — DIFFERENTIAL
Abs Immature Granulocytes: 0.01 10*3/uL (ref 0.00–0.07)
Basophils Absolute: 0 10*3/uL (ref 0.0–0.1)
Basophils Relative: 1 %
Eosinophils Absolute: 0.7 10*3/uL — ABNORMAL HIGH (ref 0.0–0.5)
Eosinophils Relative: 14 %
Immature Granulocytes: 0 %
Lymphocytes Relative: 29 %
Lymphs Abs: 1.5 10*3/uL (ref 0.7–4.0)
Monocytes Absolute: 0.5 10*3/uL (ref 0.1–1.0)
Monocytes Relative: 11 %
Neutro Abs: 2.3 10*3/uL (ref 1.7–7.7)
Neutrophils Relative %: 45 %

## 2019-06-06 LAB — CBG MONITORING, ED: Glucose-Capillary: 140 mg/dL — ABNORMAL HIGH (ref 70–99)

## 2019-06-06 LAB — I-STAT CHEM 8, ED
BUN: 23 mg/dL (ref 8–23)
Calcium, Ion: 1.01 mmol/L — ABNORMAL LOW (ref 1.15–1.40)
Chloride: 104 mmol/L (ref 98–111)
Creatinine, Ser: 6.5 mg/dL — ABNORMAL HIGH (ref 0.44–1.00)
Glucose, Bld: 138 mg/dL — ABNORMAL HIGH (ref 70–99)
HCT: 30 % — ABNORMAL LOW (ref 36.0–46.0)
Hemoglobin: 10.2 g/dL — ABNORMAL LOW (ref 12.0–15.0)
Potassium: 4 mmol/L (ref 3.5–5.1)
Sodium: 138 mmol/L (ref 135–145)
TCO2: 25 mmol/L (ref 22–32)

## 2019-06-06 LAB — PROTIME-INR
INR: 1.2 (ref 0.8–1.2)
Prothrombin Time: 15.1 seconds (ref 11.4–15.2)

## 2019-06-06 LAB — ETHANOL: Alcohol, Ethyl (B): <10 mg/dL (ref <10)

## 2019-06-06 LAB — APTT: aPTT: 27 seconds (ref 24–36)

## 2019-06-06 NOTE — ED Triage Notes (Signed)
Reported last well known 0900 this morning. C/o slurred speech and facial swelling and numbness; reported stroke hx 2 years ago and has right sided deficits. HD MWF.

## 2019-06-06 NOTE — ED Provider Notes (Signed)
[facility] EMERGENCY DEPARTMENT Provider Note   CSN: [id number] Arrival date & time: 06/06/19  2143     History Chief Complaint  Patient presents with  . Headache    Roberta Bryant is a 68 y.o. female.  Patient with history of end-stage renal disease on hemodialysis, previous stroke with right-sided deficits, coronary artery disease/CHF --presents to the emergency department with complaint of headache, slurred speech, right-sided facial swelling and right lower extremity numbness starting at 9 AM today.  Also c/o HA.  Patient reports not feeling well, feeling tired after dialysis sessions.  Her session yesterday was normal.  She states that she was at her dry weight at the end.  She denies any fevers, chest pain, abdominal pain, vomiting, diarrhea.  No lower extremity swelling.  No treatments prior to arrival.  Patient transported by EMS.  Onset of symptoms acute on chronic.         Past Medical History:  Diagnosis Date  . Anemia   . Aortic stenosis   . CHF (congestive heart failure) ([place])   . Childhood asthma   . Chronic upper back pain   . Coronary artery disease    Stent to left main coronary artery second of August 2017  . Decreased hearing   . ESRD (end stage renal disease) on dialysis [facility])    "MWF; [facility] in [place]" (01/06/2018)  . Gait abnormality 08/28/2016  . GERD (gastroesophageal reflux disease)   . Headache    "a few times/week" (10/30/2014)  . Headache syndrome 01/08/2017  . Heart murmur   . History of blood transfusion 1996   "related to menses"  . Hyperlipidemia   . Hypertension   . Hypothyroidism   . IBS (irritable bowel syndrome)   . Myocardial infarction ([place]) 2004  . Osteoarthritis   . Pneumonia    "years ago"  . Rotator cuff arthropathy of right shoulder   . Spondylosis of cervical spine 06/16/2017  . Stroke ([place]) 07/2016   mini stroke , right side of body weak-   . Type II diabetes mellitus [facility])     Patient Active  Problem List   Diagnosis Date Noted  . CVA (cerebral vascular accident) ([place]) 05/30/2019  . Shoulder arthritis 01/06/2018  . Rotator cuff arthropathy of right shoulder   . Status post arthroscopy of right shoulder 06/22/2017  . Spondylosis of cervical spine 06/16/2017  . Trigger index finger of right hand 06/11/2017  . Aortic stenosis 04/12/2017  . Coronary artery diseaseStatus post PTCA and stenting with drug-eluting stent to left main coronary artery in the summer 2017 04/12/2017  . 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 Decreased ROM of finger 03/01/2017  . Decreased ROM of wrist 03/01/2017  . Pain in finger of right hand 03/01/2017  . Headache syndrome 01/08/2017  . Right carpal tunnel syndrome 12/14/2016  . Complete tear of right rotator cuff 10/21/2016  . Gait abnormality 08/28/2016  . Nasal injury, initial encounter 07/01/2016  . Nasal septal deviation 07/01/2016  . Nasal turbinate hypertrophy 07/01/2016  . Abnormal stress test 01/01/2016  . Chest pain 11/23/2015  . ESRD on dialysis ([place])   . Type 2 diabetes mellitus with complication ([place])   . Essential hypertension   . Dyslipidemia 12/24/2014  . Hemodialysis patient ([place]) 12/24/2014  . Abnormal ECG   . Pain in the chest   . Hypotension 10/30/2014  . Abnormal EKG 10/30/2014  . 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 Essential tremor 01/10/2014  . Parkinson's disease (  [place]) 01/10/2014  . Migraine without aura 07/28/2013  . End stage renal disease ([place]) 07/18/2012  . Other complications due to renal dialysis device, implant, and graft 07/18/2012    Past Surgical History:  Procedure Laterality Date  . A/V FISTULAGRAM Left 05/31/2018   Procedure: A/V FISTULAGRAM;  Surgeon: Serafina Mitchell, MD;  Location: [facility];  Service: Cardiovascular;  Laterality: Left;  . APPENDECTOMY    . AV FISTULA PLACEMENT Left 12/03/2006   "forearm"  . AV FISTULA REPAIR Left 11/25/2009   "forearm"  . CARPAL TUNNEL  RELEASE Right 02/11/2017   Procedure: RIGHT CARPAL TUNNEL RELEASE;  Surgeon: Daryll Brod, MD;  Location: [place];  Service: Orthopedics;  Laterality: Right;  . CHOLECYSTECTOMY OPEN  1970's  . COLONOSCOPY W/ POLYPECTOMY  07/30/2010   Minimal sigmoid diverticulosis. Small internal hemorrhoids. Otherwise normal colonoscopy to TI.   Marland Kitchen CORONARY ANGIOPLASTY    . CORONARY ANGIOPLASTY WITH STENT PLACEMENT    . ESOPHAGOGASTRODUODENOSCOPY  10/21/2012   Gastric polyps- status post polypectomy. Mild gastritis.   . INSERTION OF DIALYSIS CATHETER Right 2010   "chest"  . PARATHYROIDECTOMY  2010?   Parathyroid autotransplantation   . REVERSE SHOULDER ARTHROPLASTY Right 01/06/2018  . REVERSE SHOULDER ARTHROPLASTY Right 01/06/2018   Procedure: RIGHT REVERSE SHOULDER ARTHROPLASTY;  Surgeon: [name], MD;  Location: [place];  Service: Orthopedics;  Laterality: Right;  . REVISON OF ARTERIOVENOUS FISTULA Left 12/04/2013   Procedure: REVISON OF ARTERIOVENOUS FISTULA;  Surgeon: Rosetta Posner, MD;  Location: [place];  Service: Vascular;  Laterality: Left;  . REVISON OF ARTERIOVENOUS FISTULA Left 08/08/6710   Procedure: PLICATION OF LEFT BRACHIOCEPHALIC  ARTERIOVENOUS FISTULA;  Surgeon: Conrad Fowlerton, MD;  Location: [place];  Service: Vascular;  Laterality: Left;  . SHOULDER ARTHROSCOPY WITH ROTATOR CUFF REPAIR AND SUBACROMIAL DECOMPRESSION Right 06/22/2017   Procedure: RIGHT SHOULDER ARTHROSCOPY WITH EXTENSIVE DEBRIDEMENT;  Surgeon: Mcarthur Rossetti, MD;  Location: [place];  Service: Orthopedics;  Laterality: Right;  . TRIGGER FINGER RELEASE Right 10/22/2017   Procedure: RELEASE RIGHT INDEX TRIGGER FINGER/A-1 PULLEY;  Surgeon: Daryll Brod, MD;  Location: [place];  Service: Orthopedics;  Laterality: Right;  . TUBAL LIGATION  1989  . UMBILICAL HERNIA REPAIR  1990's X 2     OB History   No obstetric history on file.     Family History  Problem Relation Age of Onset  . Epilepsy Mother   .  Diabetes Mother   . Cancer Mother   . Alcohol abuse Father     Social History   Tobacco Use  . Smoking status: Former Smoker    Years: 3.00    Types: Cigarettes  . Smokeless tobacco: Never Used  . Tobacco comment: "stopped smoking in the 1970's"  Substance Use Topics  . Alcohol use: No  . Drug use: Not Currently    Types: Marijuana, Hydromorphone    Comment: as a teenager    Home Medications Prior to Admission medications   Medication Sig Start Date End Date Taking? Authorizing Provider  acetaminophen (TYLENOL) 500 MG tablet Take 500-1,000 mg by mouth every 6 (six) hours as needed (FOR PAIN.).    [provider]  aspirin EC 81 MG tablet Take 81 mg by mouth daily.     [provider]  carvedilol (COREG) 6.25 MG tablet Take 6.25 mg by mouth 2 (two) times daily with a meal.    [provider]  clonazePAM (KLONOPIN) 0.5 MG tablet TAKE 1  TABLET BY MOUTH AT BEDTIME 05/15/19   Kathrynn Ducking, MD  dimenhyDRINATE (DRAMAMINE) 50 MG tablet Take 50 mg by mouth every 8 (eight) hours as needed (for dizziness/vertigo).    [provider]  escitalopram (LEXAPRO) 10 MG tablet TAKE 1 TABLET BY MOUTH ONCE DAILY FOR 30 DAYS 11/17/18   [provider]  ezetimibe (ZETIA) 10 MG tablet Take 10 mg by mouth every evening.  05/10/16   [provider]  fenofibrate 160 MG tablet Take 160 mg by mouth every evening.  03/08/17   [provider]  finasteride (PROSCAR) 5 MG tablet Take 2.5 mg by mouth daily at 12 noon.     [provider]  folic acid-vitamin b complex-vitamin c-selenium-zinc (DIALYVITE) 3 MG TABS tablet Take 1 tablet by mouth daily.    [provider]  HYDROcodone-acetaminophen (NORCO/VICODIN) 5-325 MG tablet Take 1-2 tablets by mouth every 6 (six) hours as needed for severe pain. 03/06/19   Joy, Shawn C, PA-C  Lanthanum Carbonate (FOSRENOL) 1000 MG PACK Take 2 packets by mouth 3 (three) times daily.    [provider]  levothyroxine (SYNTHROID, LEVOTHROID) 50 MCG tablet Take 50 mcg by mouth daily before breakfast.     [provider]  lidocaine-prilocaine (EMLA) cream Apply 1 application topically See admin instructions. Apply a small amount to access site 1-2 hours before dialysis--cover with occlusive dressing 09/24/17   [provider]  loperamide (IMODIUM A-D) 2 MG tablet Take 2-4 mg by mouth every Monday, Wednesday, and Friday with hemodialysis.     [provider]  nortriptyline (PAMELOR) 10 MG capsule Take 2 capsules (20 mg total) by mouth at bedtime. 08/19/18   Kathrynn Ducking, MD  ondansetron (ZOFRAN ODT) 4 MG disintegrating tablet Take 1 tablet (4 mg total) by mouth every 8 (eight) hours as needed for nausea or vomiting. Patient not taking: Reported on 05/30/2019 05/18/19   Jackquline Denmark, MD  pantoprazole (PROTONIX) 40 MG tablet Take 1 tablet (40 mg total) by mouth 2 (two) times daily. 05/18/19   Jackquline Denmark, MD  VICTOZA 18 MG/3ML SOPN Inject 18 mg into the skin at bedtime.  11/07/18   [provider]  Vitamin D, Ergocalciferol, (DRISDOL) 1.25 MG (50000 UT) CAPS capsule Take 50,000 Units by mouth every 30 (thirty) days. 04/22/19   [provider]    Allergies    Sulfa antibiotics, Amoxicillin, Crestor [rosuvastatin calcium], Ibuprofen, Naldecon senior [guaifenesin], Statins, Chlorphen-phenyleph-asa, and Gabapentin  Review of Systems   Review of Systems  Constitutional: Negative for fever.  HENT: Negative for rhinorrhea and sore throat.   Eyes: Negative for redness.  Respiratory: Negative for cough.   Cardiovascular: Negative for chest pain.  Gastrointestinal: Negative for abdominal pain, diarrhea, nausea and vomiting.  Genitourinary: Negative for flank pain.  Musculoskeletal: Negative for myalgias.  Skin: Negative for rash.  Neurological: Positive for speech difficulty, weakness, numbness and headaches. Negative for facial asymmetry.      Physical Exam Updated Vital Signs BP 129/87 (BP Location: Right Wrist)   Pulse 79   Temp 98.7 F (37.1 C) (Oral)   Resp (!) 21   SpO2 98%   Physical Exam Vitals and nursing note reviewed.  Constitutional:      Appearance: She is well-developed.  HENT:     Head: Normocephalic and atraumatic.     Right Ear: Tympanic membrane, ear canal and external ear normal.     Left Ear: Tympanic membrane, ear canal and external ear normal.  Nose: Nose normal.     Mouth/Throat:     Pharynx: Uvula midline.  Eyes:     General: Lids are normal.        Right eye: No discharge.        Left eye: No discharge.     Extraocular Movements:     Right eye: No nystagmus.     Left eye: No nystagmus.     Conjunctiva/sclera: Conjunctivae normal.     Pupils: Pupils are equal, round, and reactive to light.  Cardiovascular:     Rate and Rhythm: Normal rate and regular rhythm.     Heart sounds: Normal heart sounds.  Pulmonary:     Effort: Pulmonary effort is normal.     Breath sounds: Normal breath sounds.  Abdominal:     Palpations: Abdomen is soft.     Tenderness: There is no abdominal tenderness.  Musculoskeletal:     Cervical back: Normal range of motion and neck supple. No tenderness or bony tenderness.  Skin:    General: Skin is warm and dry.  Neurological:     Mental Status: She is alert and oriented to person, place, and time.     GCS: GCS eye subscore is 4. GCS verbal subscore is 5. GCS motor subscore is 6.     Cranial Nerves: No cranial nerve deficit, dysarthria or facial asymmetry.     Sensory: No sensory deficit.     Coordination: Coordination normal.     Gait: Gait normal.     Deep Tendon Reflexes: Reflexes are normal and symmetric.     ED Results / Procedures / Treatments   Labs (all labs ordered are listed, but only abnormal results are displayed) Labs Reviewed  CBC - Abnormal; Notable for the following components:      Result Value   RBC 2.62 (*)    Hemoglobin 10.0  (*)    HCT 32.1 (*)    MCV 122.5 (*)    MCH 38.2 (*)    All other components within normal limits  DIFFERENTIAL - Abnormal; Notable for the following components:   Eosinophils Absolute 0.7 (*)    All other components within normal limits  COMPREHENSIVE METABOLIC PANEL - Abnormal; Notable for the following components:   CO2 21 (*)    Glucose, Bld 145 (*)    BUN 24 (*)    Creatinine, Ser 5.88 (*)    Calcium 8.3 (*)    Total Protein 6.4 (*)    Albumin 2.8 (*)    GFR calc non Af Amer 7 (*)    GFR calc Af Amer 8 (*)    All other components within normal limits  I-STAT CHEM 8, ED - Abnormal; Notable for the following components:   Creatinine, Ser 6.50 (*)    Glucose, Bld 138 (*)    Calcium, Ion 1.01 (*)    Hemoglobin 10.2 (*)    HCT 30.0 (*)    All other components within normal limits  CBG MONITORING, ED - Abnormal; Notable for the following components:   Glucose-Capillary 140 (*)    All other components within normal limits  ETHANOL  PROTIME-INR  APTT    EKG EKG Interpretation  Date/Time:  Tuesday June 06 2019 22:23:12 EST Ventricular Rate:  71 PR Interval:    QRS Duration: 95 QT Interval:  427 QTC Calculation: 464 R Axis:   38 Text Interpretation: Sinus rhythm Confirmed by Quintella Reichert [phone]) on 06/06/2019 10:44:20 PM   Radiology CT Head Wo Contrast  Result Date: 06/06/2019 CLINICAL DATA:  Neuro deficit, acute, stroke suspected. Slurred speech. Facial swelling and numbness. EXAM: CT HEAD WITHOUT CONTRAST TECHNIQUE: Contiguous axial images were obtained from the base of the skull through the vertex without intravenous contrast. COMPARISON:  Head CT 03/29/2019, brain MRI 03/30/2019 FINDINGS: Brain: No acute hemorrhage. No evidence of acute ischemia. Small remote left parietal infarct. Stable degree of chronic small vessel ischemia and age related atrophy. No hydrocephalus, midline shift, mass effect, or extra-axial collection. Vascular: Atherosclerosis of  skullbase vasculature without hyperdense vessel or abnormal calcification. Skull: No fracture or focal lesion. Sinuses/Orbits: Paranasal sinuses and mastoid air cells are clear. The visualized orbits are unremarkable. Postsurgical change in the right mastoid air cells. Small osteoma in anterior right ethmoid air cell. Other: None. IMPRESSION: 1. No acute intracranial abnormality. 2. Stable atrophy, chronic small vessel ischemia and remote left parietal infarct. Electronically Signed   By: Keith Rake M.D.   On: 06/06/2019 23:21    Procedures Procedures (including critical care time)  Medications Ordered in ED Medications - No data to display  ED Course  I have reviewed the triage vital signs and the nursing notes.  Pertinent labs & imaging results that were available during my care of the patient were reviewed by me and considered in my medical decision making (see chart for details).  Patient seen and examined. Work-up initiated. No focal deficits at time of my exam. Attempted to call son, [place], x 1 no answer. Left message.   Vital signs reviewed and are as follows: BP 129/87 (BP Location: Right Wrist)   Pulse 79   Temp 98.7 F (37.1 C) (Oral)   Resp (!) 21   SpO2 98%   11:30 PM work-up to this point is reassuring.  I was able to get a hold of the patient's son.  He reports that her speech was much less distinctive than normal today.  Patient also stated herself that she felt like she was having strokelike symptoms.  Plan: MRI to rule in/out acute stroke.  Plan for discharge if negative.    MDM Rules/Calculators/A&P                      Pending completion of work-up.   Final Clinical Impression(s) / ED Diagnoses Final diagnoses:  Slurred speech  Paresthesia    Rx / DC Orders ED Discharge Orders    None       Carlisle Cater, Hershal Coria 06/06/19 2331    Quintella Reichert, MD 06/10/19 1339

## 2019-06-07 ENCOUNTER — Other Ambulatory Visit: Payer: Self-pay

## 2019-06-07 ENCOUNTER — Encounter (HOSPITAL_COMMUNITY): Payer: Self-pay | Admitting: *Deleted

## 2019-06-07 ENCOUNTER — Emergency Department (HOSPITAL_COMMUNITY): Payer: Medicare Other

## 2019-06-07 DIAGNOSIS — R4781 Slurred speech: Secondary | ICD-10-CM | POA: Diagnosis not present

## 2019-06-07 MED ORDER — ACETAMINOPHEN 500 MG PO TABS
1000.0000 mg | ORAL_TABLET | Freq: Once | ORAL | Status: AC
  Administered 2019-06-07: 1000 mg via ORAL
  Filled 2019-06-07: qty 2

## 2019-06-07 NOTE — ED Notes (Signed)
Pt returned from MRI °

## 2019-06-07 NOTE — ED Provider Notes (Signed)
Results for orders placed or performed during the hospital encounter of 06/06/19  Ethanol  Result Value Ref Range   Alcohol, Ethyl (B) <10 <10 mg/dL  Protime-INR  Result Value Ref Range   Prothrombin Time 15.1 11.4 - 15.2 seconds   INR 1.2 0.8 - 1.2  APTT  Result Value Ref Range   aPTT 27 24 - 36 seconds  CBC  Result Value Ref Range   WBC 5.0 4.0 - 10.5 K/uL   RBC 2.62 (L) 3.87 - 5.11 MIL/uL   Hemoglobin 10.0 (L) 12.0 - 15.0 g/dL   HCT 32.1 (L) 36.0 - 46.0 %   MCV 122.5 (H) 80.0 - 100.0 fL   MCH 38.2 (H) 26.0 - 34.0 pg   MCHC 31.2 30.0 - 36.0 g/dL   RDW 15.0 11.5 - 15.5 %   Platelets 203 150 - 400 K/uL   nRBC 0.0 0.0 - 0.2 %  Differential  Result Value Ref Range   Neutrophils Relative % 45 %   Neutro Abs 2.3 1.7 - 7.7 K/uL   Lymphocytes Relative 29 %   Lymphs Abs 1.5 0.7 - 4.0 K/uL   Monocytes Relative 11 %   Monocytes Absolute 0.5 0.1 - 1.0 K/uL   Eosinophils Relative 14 %   Eosinophils Absolute 0.7 (H) 0.0 - 0.5 K/uL   Basophils Relative 1 %   Basophils Absolute 0.0 0.0 - 0.1 K/uL   Immature Granulocytes 0 %   Abs Immature Granulocytes 0.01 0.00 - 0.07 K/uL  Comprehensive metabolic panel  Result Value Ref Range   Sodium 139 135 - 145 mmol/L   Potassium 4.1 3.5 - 5.1 mmol/L   Chloride 104 98 - 111 mmol/L   CO2 21 (L) 22 - 32 mmol/L   Glucose, Bld 145 (H) 70 - 99 mg/dL   BUN 24 (H) 8 - 23 mg/dL   Creatinine, Ser 5.88 (H) 0.44 - 1.00 mg/dL   Calcium 8.3 (L) 8.9 - 10.3 mg/dL   Total Protein 6.4 (L) 6.5 - 8.1 g/dL   Albumin 2.8 (L) 3.5 - 5.0 g/dL   AST 27 15 - 41 U/L   ALT 15 0 - 44 U/L   Alkaline Phosphatase 66 38 - 126 U/L   Total Bilirubin 1.0 0.3 - 1.2 mg/dL   GFR calc non Af Amer 7 (L) >60 mL/min   GFR calc Af Amer 8 (L) >60 mL/min   Anion gap 14 5 - 15  I-stat chem 8, ED  Result Value Ref Range   Sodium 138 135 - 145 mmol/L   Potassium 4.0 3.5 - 5.1 mmol/L   Chloride 104 98 - 111 mmol/L   BUN 23 8 - 23 mg/dL   Creatinine, Ser 6.50 (H) 0.44 - 1.00  mg/dL   Glucose, Bld 138 (H) 70 - 99 mg/dL   Calcium, Ion 1.01 (L) 1.15 - 1.40 mmol/L   TCO2 25 22 - 32 mmol/L   Hemoglobin 10.2 (L) 12.0 - 15.0 g/dL   HCT 30.0 (L) 36.0 - 46.0 %  CBG monitoring, ED  Result Value Ref Range   Glucose-Capillary 140 (H) 70 - 99 mg/dL   CT Head Wo Contrast  Result Date: 06/06/2019 CLINICAL DATA:  Neuro deficit, acute, stroke suspected. Slurred speech. Facial swelling and numbness. EXAM: CT HEAD WITHOUT CONTRAST TECHNIQUE: Contiguous axial images were obtained from the base of the skull through the vertex without intravenous contrast. COMPARISON:  Head CT 03/29/2019, brain MRI 03/30/2019 FINDINGS: Brain: No acute hemorrhage. No evidence of acute  ischemia. Small remote left parietal infarct. Stable degree of chronic small vessel ischemia and age related atrophy. No hydrocephalus, midline shift, mass effect, or extra-axial collection. Vascular: Atherosclerosis of skullbase vasculature without hyperdense vessel or abnormal calcification. Skull: No fracture or focal lesion. Sinuses/Orbits: Paranasal sinuses and mastoid air cells are clear. The visualized orbits are unremarkable. Postsurgical change in the right mastoid air cells. Small osteoma in anterior right ethmoid air cell. Other: None. IMPRESSION: 1. No acute intracranial abnormality. 2. Stable atrophy, chronic small vessel ischemia and remote left parietal infarct. Electronically Signed   By: Keith Rake M.D.   On: 06/06/2019 23:21   MR BRAIN WO CONTRAST  Result Date: 06/07/2019 CLINICAL DATA:  Headache and slurred speech. History of stroke and end-stage renal disease EXAM: MRI HEAD WITHOUT CONTRAST TECHNIQUE: Multiplanar, multiecho pulse sequences of the brain and surrounding structures were obtained without intravenous contrast. COMPARISON:  03/30/2019 FINDINGS: Brain: No acute infarction, hemorrhage, hydrocephalus, or mass lesion. Chronic small vessel ischemic type change in the cerebral white matter and  pons, extensive in the supratentorial white matter. Remote left caudate head and adjacent white matter infarct. Small remote left parietal cortex infarct with hemosiderin staining. Mild CSF accumulation behind the cerebellum. Vascular: Loss of normal flow void in the right vertebral artery, chronic. Skull and upper cervical spine: Degenerative facet spurring with C4-5 mild anterolisthesis. Sinuses/Orbits: Negative IMPRESSION: 1. No acute finding. 2. Chronic small vessel ischemia with remote left basal ganglia and left parietal cortex infarcts. 3. Chronic occlusion of the right vertebral artery. Electronically Signed   By: Monte Fantasia M.D.   On: 06/07/2019 04:58    5:23 AM MRI negative for any acute findings, chronic findings noted.  Patient remains AAOx3 here.  VSS.  Her speech is clear and goal oriented, thoughts are clear and concise.  There was reportedly some concern for facial swelling from her sons, however I do not appreciate any of this on exam.  No skin changes, lesions, or rash of the face either.  She still complains of headache so tylenol was given.  She did inquire about skipping dialysis today since she has been here all night, I have strongly advised her to keep her usual dialysis session for 11am as scheduled.  She will follow-up with neurology as an OP.  Return here for any new/acute changes.   Larene Pickett, PA-C 06/07/19 1031    Orpah Greek, MD 06/08/19 [phone]

## 2019-06-07 NOTE — ED Notes (Signed)
Pt placed back on monitoring

## 2019-06-07 NOTE — Discharge Instructions (Signed)
MRI today was normal, no new stroke. Recommend to follow-up closely with neurologist. Continue all your usual medication and normal dialysis sessions. Return here for any new/acute changes.

## 2019-07-03 ENCOUNTER — Telehealth: Payer: Self-pay

## 2019-07-03 NOTE — Telephone Encounter (Signed)
Submitted VOB for SynviscOne, bilateral knee. 

## 2019-07-11 ENCOUNTER — Ambulatory Visit: Payer: Medicare Other | Admitting: Family Medicine

## 2019-07-17 ENCOUNTER — Telehealth: Payer: Self-pay

## 2019-07-17 NOTE — Telephone Encounter (Signed)
Called and left a VM for patient to call back to schedule an appointment for gel injection with Dr. Marlou Sa after 07/29/2019.  Approved for SynviscOne, bilateral knee Buy & Bill Covered at 100% through her secondary insurance after Medicare deductible has been met No Co-pay No PA required

## 2019-07-19 ENCOUNTER — Emergency Department (HOSPITAL_COMMUNITY): Payer: Medicare Other

## 2019-07-19 ENCOUNTER — Telehealth: Payer: Self-pay | Admitting: Orthopedic Surgery

## 2019-07-19 ENCOUNTER — Emergency Department (HOSPITAL_COMMUNITY)
Admission: EM | Admit: 2019-07-19 | Discharge: 2019-07-19 | Disposition: A | Discharge status: Home / Self Care | Payer: Medicare Other | Attending: Emergency Medicine | Admitting: Emergency Medicine

## 2019-07-19 DIAGNOSIS — Z7982 Long term (current) use of aspirin: Secondary | ICD-10-CM | POA: Diagnosis not present

## 2019-07-19 DIAGNOSIS — R112 Nausea with vomiting, unspecified: Secondary | ICD-10-CM | POA: Insufficient documentation

## 2019-07-19 DIAGNOSIS — Z992 Dependence on renal dialysis: Secondary | ICD-10-CM | POA: Insufficient documentation

## 2019-07-19 DIAGNOSIS — R531 Weakness: Secondary | ICD-10-CM | POA: Insufficient documentation

## 2019-07-19 DIAGNOSIS — I509 Heart failure, unspecified: Secondary | ICD-10-CM | POA: Diagnosis not present

## 2019-07-19 DIAGNOSIS — U071 COVID-19: Secondary | ICD-10-CM | POA: Insufficient documentation

## 2019-07-19 DIAGNOSIS — I132 Hypertensive heart and chronic kidney disease with heart failure and with stage 5 chronic kidney disease, or end stage renal disease: Secondary | ICD-10-CM | POA: Insufficient documentation

## 2019-07-19 DIAGNOSIS — N186 End stage renal disease: Secondary | ICD-10-CM | POA: Insufficient documentation

## 2019-07-19 DIAGNOSIS — R509 Fever, unspecified: Secondary | ICD-10-CM | POA: Diagnosis present

## 2019-07-19 DIAGNOSIS — J189 Pneumonia, unspecified organism: Secondary | ICD-10-CM

## 2019-07-19 DIAGNOSIS — E1122 Type 2 diabetes mellitus with diabetic chronic kidney disease: Secondary | ICD-10-CM | POA: Diagnosis not present

## 2019-07-19 DIAGNOSIS — Z79899 Other long term (current) drug therapy: Secondary | ICD-10-CM | POA: Insufficient documentation

## 2019-07-19 LAB — CBC WITH DIFFERENTIAL/PLATELET
Abs Immature Granulocytes: 0.01 10*3/uL (ref 0.00–0.07)
Basophils Absolute: 0 10*3/uL (ref 0.0–0.1)
Basophils Relative: 0 %
Eosinophils Absolute: 0.1 10*3/uL (ref 0.0–0.5)
Eosinophils Relative: 3 %
HCT: 30.7 % — ABNORMAL LOW (ref 36.0–46.0)
Hemoglobin: 10 g/dL — ABNORMAL LOW (ref 12.0–15.0)
Immature Granulocytes: 0 %
Lymphocytes Relative: 25 %
Lymphs Abs: 1 10*3/uL (ref 0.7–4.0)
MCH: 37.5 pg — ABNORMAL HIGH (ref 26.0–34.0)
MCHC: 32.6 g/dL (ref 30.0–36.0)
MCV: 115 fL — ABNORMAL HIGH (ref 80.0–100.0)
Monocytes Absolute: 0.3 10*3/uL (ref 0.1–1.0)
Monocytes Relative: 9 %
Neutro Abs: 2.4 10*3/uL (ref 1.7–7.7)
Neutrophils Relative %: 63 %
Platelets: 133 10*3/uL — ABNORMAL LOW (ref 150–400)
RBC: 2.67 MIL/uL — ABNORMAL LOW (ref 3.87–5.11)
RDW: 14 % (ref 11.5–15.5)
WBC: 3.9 10*3/uL — ABNORMAL LOW (ref 4.0–10.5)
nRBC: 0 % (ref 0.0–0.2)

## 2019-07-19 LAB — COMPREHENSIVE METABOLIC PANEL
ALT: 27 U/L (ref 0–44)
AST: 96 U/L — ABNORMAL HIGH (ref 15–41)
Albumin: 2.5 g/dL — ABNORMAL LOW (ref 3.5–5.0)
Alkaline Phosphatase: 52 U/L (ref 38–126)
Anion gap: 14 (ref 5–15)
BUN: 20 mg/dL (ref 8–23)
CO2: 30 mmol/L (ref 22–32)
Calcium: 7.7 mg/dL — ABNORMAL LOW (ref 8.9–10.3)
Chloride: 94 mmol/L — ABNORMAL LOW (ref 98–111)
Creatinine, Ser: 6.83 mg/dL — ABNORMAL HIGH (ref 0.44–1.00)
GFR calc Af Amer: 7 mL/min — ABNORMAL LOW (ref >60)
GFR calc non Af Amer: 6 mL/min — ABNORMAL LOW (ref >60)
Glucose, Bld: 144 mg/dL — ABNORMAL HIGH (ref 70–99)
Potassium: 4.3 mmol/L (ref 3.5–5.1)
Sodium: 138 mmol/L (ref 135–145)
Total Bilirubin: 1.3 mg/dL — ABNORMAL HIGH (ref 0.3–1.2)
Total Protein: 6 g/dL — ABNORMAL LOW (ref 6.5–8.1)

## 2019-07-19 LAB — LACTIC ACID, PLASMA: Lactic Acid, Venous: 2.5 mmol/L (ref 0.5–1.9)

## 2019-07-19 MED ORDER — DOXYCYCLINE HYCLATE 100 MG PO CAPS: 100.0000 mg | ORAL_CAPSULE | Freq: Two times a day (BID) | ORAL | 0 refills | Status: DC

## 2019-07-19 MED ORDER — SODIUM CHLORIDE 0.9 % IV BOLUS: 1000.0000 mL | Freq: Once | INTRAVENOUS | Status: DC

## 2019-07-19 MED ORDER — SODIUM CHLORIDE 0.9 % IV BOLUS: 500.0000 mL | Freq: Once | INTRAVENOUS | Status: DC

## 2019-07-19 NOTE — ED Notes (Signed)
This RN went over D/C papers, pt verbalized understanding of medication and follow up, pt verbalizes intent to go to dialysis tomorrow. Pt picked up by son. This RN wheeled pt to car via her personal wheelchair.

## 2019-07-19 NOTE — Telephone Encounter (Signed)
Noted, as long as completed within next 6 months she is fine

## 2019-07-19 NOTE — ED Provider Notes (Addendum)
Covid.  Hypotension.  Getting fluids.  Reassess for discharge after 500 cc fluid bolus. Physical Exam  BP (!) 111/98   Pulse 67   Temp 98.3 F (36.8 C) (Oral)   Resp 17   SpO2 98%   Physical Exam  ED Course/Procedures     Procedures  MDM  Patient's nurse had several critical patient issues to manage and patient had not gotten her 500 cc IV fluid bolus by 17: 45.  The patient reported she had already called her son to pick her up and she wanted to leave.  She reports that she feels fine.  She does not think that she needs IV fluids.  She reports she can eat and drink without difficulty.  She denies feeling lightheaded.  She is scheduled for dialysis tomorrow.  I have advised the patient that she is to return immediately if her temperature goes up, she feels lightheaded, shortness of breath, generally worse in any way.       Charlesetta Shanks, MD 07/19/19 1556    Charlesetta Shanks, MD 07/19/19 [phone]

## 2019-07-19 NOTE — Telephone Encounter (Signed)
Patient called.   She got a call from our office about getting her gel injection but she has COVID. She says she will call to reschedule when she's clear.   Call back number: [phone]

## 2019-07-19 NOTE — ED Notes (Signed)
Lab called this RN to report critical lactic acid of 2.5

## 2019-07-19 NOTE — Telephone Encounter (Signed)
Sending to you just as [facility]

## 2019-07-19 NOTE — ED Provider Notes (Signed)
[facility] EMERGENCY DEPARTMENT Provider Note   CSN: [id number] Arrival date & time: 07/19/19  1151     History Chief Complaint  Patient presents with  . Fever    Roberta Bryant is a 69 y.o. female.  HPI Patient presents with fever and weakness.  Positive Covid test 1 week ago.  Is a dialysis patient.  Dialyzed Monday Wednesday Friday.  States she is felt more lightheaded.  Has had decreased oral intake.  Occasional cough.  Has had some nausea vomiting and diarrhea.    Past Medical History:  Diagnosis Date  . Anemia   . Aortic stenosis   . CHF (congestive heart failure) ([place])   . Childhood asthma   . Chronic upper back pain   . Coronary artery disease    Stent to left main coronary artery second of August 2017  . Decreased hearing   . ESRD (end stage renal disease) on dialysis [facility])    "MWF; [facility] in [place]" (01/06/2018)  . Gait abnormality 08/28/2016  . GERD (gastroesophageal reflux disease)   . Headache    "a few times/week" (10/30/2014)  . Headache syndrome 01/08/2017  . Heart murmur   . History of blood transfusion 1996   "related to menses"  . Hyperlipidemia   . Hypertension   . Hypothyroidism   . IBS (irritable bowel syndrome)   . Myocardial infarction ([place]) 2004  . Osteoarthritis   . Pneumonia    "years ago"  . Rotator cuff arthropathy of right shoulder   . Spondylosis of cervical spine 06/16/2017  . Stroke ([place]) 07/2016   mini stroke , right side of body weak-   . Type II diabetes mellitus [facility])     Patient Active Problem List   Diagnosis Date Noted  . CVA (cerebral vascular accident) ([place]) 05/30/2019  . Shoulder arthritis 01/06/2018  . Rotator cuff arthropathy of right shoulder   . Status post arthroscopy of right shoulder 06/22/2017  . Spondylosis of cervical spine 06/16/2017  . Trigger index finger of right hand 06/11/2017  . Aortic stenosis 04/12/2017  . Coronary artery diseaseStatus post PTCA and stenting with  drug-eluting stent to left main coronary artery in the summer 2017 04/12/2017  . 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 Decreased ROM of finger 03/01/2017  . Decreased ROM of wrist 03/01/2017  . Pain in finger of right hand 03/01/2017  . Headache syndrome 01/08/2017  . Right carpal tunnel syndrome 12/14/2016  . Complete tear of right rotator cuff 10/21/2016  . Gait abnormality 08/28/2016  . Nasal injury, initial encounter 07/01/2016  . Nasal septal deviation 07/01/2016  . Nasal turbinate hypertrophy 07/01/2016  . Abnormal stress test 01/01/2016  . Chest pain 11/23/2015  . ESRD on dialysis ([place])   . Type 2 diabetes mellitus with complication ([place])   . Essential hypertension   . Dyslipidemia 12/24/2014  . Hemodialysis patient ([place]) 12/24/2014  . Abnormal ECG   . Pain in the chest   . Hypotension 10/30/2014  . Abnormal EKG 10/30/2014  . 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 Essential tremor 01/10/2014  . Parkinson's disease ([place]) 01/10/2014  . Migraine without aura 07/28/2013  . End stage renal disease ([place]) 07/18/2012  . Other complications due to renal dialysis device, implant, and graft 07/18/2012    Past Surgical History:  Procedure Laterality Date  . A/V FISTULAGRAM Left 05/31/2018   Procedure: A/V FISTULAGRAM;  Surgeon: Serafina Mitchell, MD;  Location: [place] CV LAB;  Service: Cardiovascular;  Laterality: Left;  . APPENDECTOMY    . AV FISTULA PLACEMENT Left 12/03/2006   "forearm"  . AV FISTULA REPAIR Left 11/25/2009   "forearm"  . CARPAL TUNNEL RELEASE Right 02/11/2017   Procedure: RIGHT CARPAL TUNNEL RELEASE;  Surgeon: Daryll Brod, MD;  Location: [facility];  Service: Orthopedics;  Laterality: Right;  . CHOLECYSTECTOMY OPEN  1970's  . COLONOSCOPY W/ POLYPECTOMY  07/30/2010   Minimal sigmoid diverticulosis. Small internal hemorrhoids. Otherwise normal colonoscopy to TI.   Marland Kitchen CORONARY ANGIOPLASTY    . CORONARY ANGIOPLASTY WITH STENT PLACEMENT     . ESOPHAGOGASTRODUODENOSCOPY  10/21/2012   Gastric polyps- status post polypectomy. Mild gastritis.   . INSERTION OF DIALYSIS CATHETER Right 2010   "chest"  . PARATHYROIDECTOMY  2010?   Parathyroid autotransplantation   . REVERSE SHOULDER ARTHROPLASTY Right 01/06/2018  . REVERSE SHOULDER ARTHROPLASTY Right 01/06/2018   Procedure: RIGHT REVERSE SHOULDER ARTHROPLASTY;  Surgeon: [name], MD;  Location: [place];  Service: Orthopedics;  Laterality: Right;  . REVISON OF ARTERIOVENOUS FISTULA Left 12/04/2013   Procedure: REVISON OF ARTERIOVENOUS FISTULA;  Surgeon: Rosetta Posner, MD;  Location: [place];  Service: Vascular;  Laterality: Left;  . REVISON OF ARTERIOVENOUS FISTULA Left 12/09/2593   Procedure: PLICATION OF LEFT BRACHIOCEPHALIC  ARTERIOVENOUS FISTULA;  Surgeon: Conrad Geary, MD;  Location: [place];  Service: Vascular;  Laterality: Left;  . SHOULDER ARTHROSCOPY WITH ROTATOR CUFF REPAIR AND SUBACROMIAL DECOMPRESSION Right 06/22/2017   Procedure: RIGHT SHOULDER ARTHROSCOPY WITH EXTENSIVE DEBRIDEMENT;  Surgeon: Mcarthur Rossetti, MD;  Location: [place];  Service: Orthopedics;  Laterality: Right;  . TRIGGER FINGER RELEASE Right 10/22/2017   Procedure: RELEASE RIGHT INDEX TRIGGER FINGER/A-1 PULLEY;  Surgeon: Daryll Brod, MD;  Location: [place];  Service: Orthopedics;  Laterality: Right;  . TUBAL LIGATION  1989  . UMBILICAL HERNIA REPAIR  1990's X 2     OB History   No obstetric history on file.     Family History  Problem Relation Age of Onset  . Epilepsy Mother   . Diabetes Mother   . Cancer Mother   . Alcohol abuse Father     Social History   Tobacco Use  . Smoking status: Former Smoker    Years: 3.00    Types: Cigarettes  . Smokeless tobacco: Never Used  . Tobacco comment: "stopped smoking in the 1970's"  Substance Use Topics  . Alcohol use: No  . Drug use: Not Currently    Types: Marijuana, Hydromorphone    Comment: as a teenager    Home Medications Prior to  Admission medications   Medication Sig Start Date End Date Taking? Authorizing Provider  acetaminophen (TYLENOL) 500 MG tablet Take 500-1,000 mg by mouth every 6 (six) hours as needed (FOR PAIN.).    [provider]  aspirin EC 81 MG tablet Take 81 mg by mouth daily.     [provider]  carvedilol (COREG) 6.25 MG tablet Take 6.25 mg by mouth 2 (two) times daily with a meal.    [provider]  clonazePAM (KLONOPIN) 0.5 MG tablet TAKE 1 TABLET BY MOUTH AT BEDTIME Patient taking differently: Take 0.5 mg by mouth at bedtime.  05/15/19   Kathrynn Ducking, MD  dimenhyDRINATE (DRAMAMINE) 50 MG tablet Take 50 mg by mouth every 8 (eight) hours as needed (for dizziness/vertigo).    [provider]  doxycycline (VIBRAMYCIN) 100 MG capsule Take 1 capsule (100 mg total) by mouth 2 (two) times daily.  07/19/19   Davonna Belling, MD  escitalopram (LEXAPRO) 10 MG tablet Take 10 mg by mouth daily.  11/17/18   [provider]  ezetimibe (ZETIA) 10 MG tablet Take 10 mg by mouth every evening.  05/10/16   [provider]  fenofibrate 160 MG tablet Take 160 mg by mouth every evening.  03/08/17   [provider]  finasteride (PROSCAR) 5 MG tablet Take 2.5 mg by mouth daily.     [provider]  folic acid-vitamin b complex-vitamin c-selenium-zinc (DIALYVITE) 3 MG TABS tablet Take 1 tablet by mouth daily.    [provider]  HYDROcodone-acetaminophen (NORCO/VICODIN) 5-325 MG tablet Take 1-2 tablets by mouth every 6 (six) hours as needed for severe pain. 03/06/19   Joy, Shawn C, PA-C  Lanthanum Carbonate (FOSRENOL) 1000 MG PACK Take 2,000 mg by mouth 3 (three) times daily.     [provider]  levothyroxine (SYNTHROID, LEVOTHROID) 50 MCG tablet Take 50 mcg by mouth daily before breakfast.     [provider]  lidocaine-prilocaine (EMLA) cream Apply 1 application topically See admin instructions. Apply a small amount to access  site 1-2 hours before dialysis--cover with occlusive dressing 09/24/17   [provider]  loperamide (IMODIUM A-D) 2 MG tablet Take 2-4 mg by mouth every Monday, Wednesday, and Friday with hemodialysis.     [provider]  nortriptyline (PAMELOR) 10 MG capsule Take 2 capsules (20 mg total) by mouth at bedtime. 08/19/18   Kathrynn Ducking, MD  ondansetron (ZOFRAN ODT) 4 MG disintegrating tablet Take 1 tablet (4 mg total) by mouth every 8 (eight) hours as needed for nausea or vomiting. Patient not taking: Reported on 05/30/2019 05/18/19   Jackquline Denmark, MD  pantoprazole (PROTONIX) 40 MG tablet Take 1 tablet (40 mg total) by mouth 2 (two) times daily. 05/18/19   Jackquline Denmark, MD  VICTOZA 18 MG/3ML SOPN Inject 18 mg into the skin at bedtime.  11/07/18   [provider]  Vitamin D, Ergocalciferol, (DRISDOL) 1.25 MG (50000 UT) CAPS capsule Take 50,000 Units by mouth every 30 (thirty) days. 04/22/19   [provider]    Allergies    Sulfa antibiotics, Amoxicillin, Crestor [rosuvastatin calcium], Ibuprofen, Naldecon senior [guaifenesin], Statins, Chlorphen-phenyleph-asa, and Gabapentin  Review of Systems   Review of Systems  Constitutional: Positive for appetite change and fever.  HENT: Negative for congestion.   Respiratory: Positive for cough.   Gastrointestinal: Positive for diarrhea, nausea and vomiting.  Genitourinary: Negative for flank pain.  Musculoskeletal: Negative for back pain.  Skin: Negative for rash.  Neurological: Negative for weakness.  Hematological: Negative for adenopathy.  Psychiatric/Behavioral: Negative for confusion.    Physical Exam Updated Vital Signs BP (!) 111/98   Pulse 67   Temp 98.3 F (36.8 C) (Oral)   Resp 17   SpO2 98%   Physical Exam Vitals reviewed.  HENT:     Head: Normocephalic.  Eyes:     Pupils: Pupils are equal, round, and reactive to light.  Cardiovascular:     Rate and Rhythm: Regular rhythm.  Pulmonary:      Breath sounds: No wheezing, rhonchi or rales.  Abdominal:     Tenderness: There is no abdominal tenderness.  Musculoskeletal:     Right lower leg: No edema.     Left lower leg: No edema.  Skin:    General: Skin is warm.     Capillary Refill: Capillary refill takes less than 2 seconds.  Neurological:  Mental Status: She is alert. Mental status is at baseline.     ED Results / Procedures / Treatments   Labs (all labs ordered are listed, but only abnormal results are displayed) Labs Reviewed  LACTIC ACID, PLASMA - Abnormal; Notable for the following components:      Result Value   Lactic Acid, Venous 2.5 (*)    All other components within normal limits  COMPREHENSIVE METABOLIC PANEL - Abnormal; Notable for the following components:   Chloride 94 (*)    Glucose, Bld 144 (*)    Creatinine, Ser 6.83 (*)    Calcium 7.7 (*)    Total Protein 6.0 (*)    Albumin 2.5 (*)    AST 96 (*)    Total Bilirubin 1.3 (*)    GFR calc non Af Amer 6 (*)    GFR calc Af Amer 7 (*)    All other components within normal limits  CBC WITH DIFFERENTIAL/PLATELET - Abnormal; Notable for the following components:   WBC 3.9 (*)    RBC 2.67 (*)    Hemoglobin 10.0 (*)    HCT 30.7 (*)    MCV 115.0 (*)    MCH 37.5 (*)    Platelets 133 (*)    All other components within normal limits  LACTIC ACID, PLASMA    EKG None  Radiology DG Chest Portable 1 View  Result Date: 07/19/2019 CLINICAL DATA:  Fever and weakness. COVID-19 infection. EXAM: PORTABLE CHEST 1 VIEW COMPARISON:  10/03/2017 FINDINGS: The cardiac silhouette is borderline enlarged. Aortic atherosclerosis is noted. The lungs are hypoinflated with mild opacity in the left lung base and right midlung. No pleural effusion or pneumothorax is identified. A reverse right shoulder arthroplasty is new. Right upper quadrant abdominal surgical clips are noted. IMPRESSION: 1. Right midlung opacity compatible with pneumonia with this history. 2. Mild  left basilar opacity which may reflect atelectasis or additional focus of infection. Electronically Signed   By: Logan Bores M.D.   On: 07/19/2019 13:57    Procedures Procedures (including critical care time)  Medications Ordered in ED Medications  sodium chloride 0.9 % bolus 500 mL (has no administration in time range)    ED Course  I have reviewed the triage vital signs and the nursing notes.  Pertinent labs & imaging results that were available during my care of the patient were reviewed by me and considered in my medical decision making (see chart for details).    MDM Rules/Calculators/A&P                      Patient with fever.  History of Covid.  X-ray shows possible pneumonia.  Lab work otherwise reassuring.  Had some questionable blood pressures and had a incorrectly sized cuff on.  Will give small fluid bolus see if this improves how she feels.  Not hypoxic.  Likely be able to discharge home.  Care turned over to Dr. Vallery Ridge Final Clinical Impression(s) / ED Diagnoses Final diagnoses:  COVID-19  Pneumonia due to infectious organism, unspecified laterality, unspecified part of lung    Rx / DC Orders ED Discharge Orders         Ordered    doxycycline (VIBRAMYCIN) 100 MG capsule  2 times daily     07/19/19 1546           Davonna Belling, MD 07/19/19 1548

## 2019-07-19 NOTE — ED Triage Notes (Signed)
Pt arrives to ED with c/c of fever tested + for covid 1 week ago.

## 2019-07-19 NOTE — Discharge Instructions (Signed)
Your x-ray shows what could be a pneumonia.  It could be from Covid, but we will give antibiotics in case it is a bacterial infection.  Your blood work is reassuring.

## 2019-07-24 ENCOUNTER — Telehealth: Payer: Self-pay | Admitting: *Deleted

## 2019-07-24 NOTE — Telephone Encounter (Signed)
Son called regarding mom not able to hold Rx down.  EDCM advised son to have mom take Rx along with food for a better results.

## 2019-07-25 ENCOUNTER — Ambulatory Visit: Payer: Medicare Other | Admitting: Cardiology

## 2019-08-06 ENCOUNTER — Ambulatory Visit: Payer: Medicare Other

## 2019-08-09 ENCOUNTER — Telehealth: Payer: Self-pay | Admitting: Neurology

## 2019-08-09 ENCOUNTER — Encounter (HOSPITAL_COMMUNITY): Payer: Self-pay | Admitting: Emergency Medicine

## 2019-08-09 ENCOUNTER — Emergency Department (HOSPITAL_COMMUNITY): Payer: Medicare Other

## 2019-08-09 ENCOUNTER — Other Ambulatory Visit: Payer: Self-pay

## 2019-08-09 ENCOUNTER — Emergency Department (HOSPITAL_COMMUNITY)
Admission: EM | Admit: 2019-08-09 | Discharge: 2019-08-09 | Disposition: A | Discharge status: Home / Self Care | Payer: Medicare Other | Attending: Emergency Medicine | Admitting: Emergency Medicine

## 2019-08-09 DIAGNOSIS — J1282 Pneumonia due to coronavirus disease 2019: Secondary | ICD-10-CM | POA: Insufficient documentation

## 2019-08-09 DIAGNOSIS — E1122 Type 2 diabetes mellitus with diabetic chronic kidney disease: Secondary | ICD-10-CM | POA: Diagnosis not present

## 2019-08-09 DIAGNOSIS — Z992 Dependence on renal dialysis: Secondary | ICD-10-CM | POA: Diagnosis not present

## 2019-08-09 DIAGNOSIS — R479 Unspecified speech disturbances: Secondary | ICD-10-CM | POA: Insufficient documentation

## 2019-08-09 DIAGNOSIS — I509 Heart failure, unspecified: Secondary | ICD-10-CM | POA: Insufficient documentation

## 2019-08-09 DIAGNOSIS — I132 Hypertensive heart and chronic kidney disease with heart failure and with stage 5 chronic kidney disease, or end stage renal disease: Secondary | ICD-10-CM | POA: Insufficient documentation

## 2019-08-09 DIAGNOSIS — R531 Weakness: Secondary | ICD-10-CM

## 2019-08-09 DIAGNOSIS — G20 Parkinson's disease: Secondary | ICD-10-CM | POA: Diagnosis not present

## 2019-08-09 DIAGNOSIS — N186 End stage renal disease: Secondary | ICD-10-CM

## 2019-08-09 DIAGNOSIS — Z7982 Long term (current) use of aspirin: Secondary | ICD-10-CM | POA: Diagnosis not present

## 2019-08-09 DIAGNOSIS — I252 Old myocardial infarction: Secondary | ICD-10-CM | POA: Diagnosis not present

## 2019-08-09 DIAGNOSIS — Z79899 Other long term (current) drug therapy: Secondary | ICD-10-CM | POA: Diagnosis not present

## 2019-08-09 DIAGNOSIS — E039 Hypothyroidism, unspecified: Secondary | ICD-10-CM | POA: Diagnosis not present

## 2019-08-09 DIAGNOSIS — U071 COVID-19: Secondary | ICD-10-CM | POA: Insufficient documentation

## 2019-08-09 DIAGNOSIS — R519 Headache, unspecified: Secondary | ICD-10-CM | POA: Diagnosis not present

## 2019-08-09 LAB — COMPREHENSIVE METABOLIC PANEL
ALT: 21 U/L (ref 0–44)
AST: 51 U/L — ABNORMAL HIGH (ref 15–41)
Albumin: 1.9 g/dL — ABNORMAL LOW (ref 3.5–5.0)
Alkaline Phosphatase: 49 U/L (ref 38–126)
Anion gap: 10 (ref 5–15)
BUN: 11 mg/dL (ref 8–23)
CO2: 25 mmol/L (ref 22–32)
Calcium: 8 mg/dL — ABNORMAL LOW (ref 8.9–10.3)
Chloride: 102 mmol/L (ref 98–111)
Creatinine, Ser: 5.64 mg/dL — ABNORMAL HIGH (ref 0.44–1.00)
GFR calc Af Amer: 8 mL/min — ABNORMAL LOW (ref >60)
GFR calc non Af Amer: 7 mL/min — ABNORMAL LOW (ref >60)
Glucose, Bld: 106 mg/dL — ABNORMAL HIGH (ref 70–99)
Potassium: 3.2 mmol/L — ABNORMAL LOW (ref 3.5–5.1)
Sodium: 137 mmol/L (ref 135–145)
Total Bilirubin: 1.1 mg/dL (ref 0.3–1.2)
Total Protein: 5.5 g/dL — ABNORMAL LOW (ref 6.5–8.1)

## 2019-08-09 LAB — CBC WITH DIFFERENTIAL/PLATELET
Abs Immature Granulocytes: 0.02 10*3/uL (ref 0.00–0.07)
Basophils Absolute: 0 10*3/uL (ref 0.0–0.1)
Basophils Relative: 1 %
Eosinophils Absolute: 0.1 10*3/uL (ref 0.0–0.5)
Eosinophils Relative: 2 %
HCT: 26.3 % — ABNORMAL LOW (ref 36.0–46.0)
Hemoglobin: 8.2 g/dL — ABNORMAL LOW (ref 12.0–15.0)
Immature Granulocytes: 1 %
Lymphocytes Relative: 23 %
Lymphs Abs: 1 10*3/uL (ref 0.7–4.0)
MCH: 38.7 pg — ABNORMAL HIGH (ref 26.0–34.0)
MCHC: 31.2 g/dL (ref 30.0–36.0)
MCV: 124.1 fL — ABNORMAL HIGH (ref 80.0–100.0)
Monocytes Absolute: 0.6 10*3/uL (ref 0.1–1.0)
Monocytes Relative: 14 %
Neutro Abs: 2.6 10*3/uL (ref 1.7–7.7)
Neutrophils Relative %: 59 %
Platelets: 140 10*3/uL — ABNORMAL LOW (ref 150–400)
RBC: 2.12 MIL/uL — ABNORMAL LOW (ref 3.87–5.11)
RDW: 17.8 % — ABNORMAL HIGH (ref 11.5–15.5)
WBC: 4.3 10*3/uL (ref 4.0–10.5)
nRBC: 0 % (ref 0.0–0.2)

## 2019-08-09 NOTE — Telephone Encounter (Signed)
Pt's son Roberta Bryant on [place] called stating that the pt had developed COVID and the Pneumonia and ever since then the pts memory has been really bad and she is also having difficulty walking.

## 2019-08-09 NOTE — Discharge Planning (Signed)
RNCM consulted regarding home health vs Skilled nursing facility placement. According to pt HD center [facility]), pt has been getting progressively weaker (used to be able to transfer with little assistance, now needing more).  HD Center states family relayed that they are struggling to care for her in the home.    Marland Kitchen

## 2019-08-09 NOTE — ED Provider Notes (Signed)
[place] EMERGENCY DEPARTMENT Provider Note   CSN: [id number] Arrival date & time: 08/09/19  1041     History Chief Complaint  Patient presents with  . Weakness  . Headache    Roberta Bryant is a 69 y.o. female.  Patient brought in by EMS from [facility].  Patient is a dialysis patient.  Normally dialyzed Mondays Wednesdays and Fridays.  Patient did make dialysis on Monday.  But missed it today because she was weak and slipped out of the bed onto the floor.  Her son tried to get her up by her arms.  And there was a skin tear that occurred on the left arm.  Patient has not walked for months.  She was diagnosed with Covid on February 4.  And was treated for pneumonia and and sent home with follow-up.  Patient has CVA in 2019 resulting in a little bit of slurred speech but patient verbalizes well.  EMS had reported generalized weakness which the patient agrees with not eating or drinking well and then there also a temporal headache.  Patient denied any headache to me.  Patient denied any hitting her head or any injury from the fall.  Other than the skin tear on the arm.  And patient does have a history of chronic headaches.  Patient last evaluated in the emergency department on February 10.  For COVID-19 infection but was discharged home.  Patient seen December 29 for the headache patient seen October 21 for headache as well.  Past medical history besides the end-stage renal disease and dialysis is significant for aortic stenosis congestive heart failure, headache syndrome hypertension hyperlipidemia.  Patient is AV fistulas in her left upper arm near where the skin tear occurred.  Patient denies any chest pain.  States that she has some intermittent shortness of breath.  But that has not changed recently.        Past Medical History:  Diagnosis Date  . Anemia   . Aortic stenosis   . CHF (congestive heart failure) ([place])   . Childhood asthma   . Chronic upper  back pain   . Coronary artery disease    Stent to left main coronary artery second of August 2017  . Decreased hearing   . ESRD (end stage renal disease) on dialysis [facility])    "MWF; [facility] in [place]" (01/06/2018)  . Gait abnormality 08/28/2016  . GERD (gastroesophageal reflux disease)   . Headache    "a few times/week" (10/30/2014)  . Headache syndrome 01/08/2017  . Heart murmur   . History of blood transfusion 1996   "related to menses"  . Hyperlipidemia   . Hypertension   . Hypothyroidism   . IBS (irritable bowel syndrome)   . Myocardial infarction ([place]) 2004  . Osteoarthritis   . Pneumonia    "years ago"  . Rotator cuff arthropathy of right shoulder   . Spondylosis of cervical spine 06/16/2017  . Stroke ([place]) 07/2016   mini stroke , right side of body weak-   . Type II diabetes mellitus [facility])     Patient Active Problem List   Diagnosis Date Noted  . CVA (cerebral vascular accident) ([place]) 05/30/2019  . Shoulder arthritis 01/06/2018  . Rotator cuff arthropathy of right shoulder   . Status post arthroscopy of right shoulder 06/22/2017  . Spondylosis of cervical spine 06/16/2017  . Trigger index finger of right hand 06/11/2017  . Aortic stenosis 04/12/2017  . Coronary artery diseaseStatus post PTCA and  stenting with drug-eluting stent to left main coronary artery in the summer 2017 04/12/2017  . 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 Decreased ROM of finger 03/01/2017  . Decreased ROM of wrist 03/01/2017  . Pain in finger of right hand 03/01/2017  . Headache syndrome 01/08/2017  . Right carpal tunnel syndrome 12/14/2016  . Complete tear of right rotator cuff 10/21/2016  . Gait abnormality 08/28/2016  . Nasal injury, initial encounter 07/01/2016  . Nasal septal deviation 07/01/2016  . Nasal turbinate hypertrophy 07/01/2016  . Abnormal stress test 01/01/2016  . Chest pain 11/23/2015  . ESRD on dialysis ([place])   . Type 2 diabetes mellitus with complication ([place])   . Essential  hypertension   . Dyslipidemia 12/24/2014  . Hemodialysis patient ([place]) 12/24/2014  . Abnormal ECG   . Pain in the chest   . Hypotension 10/30/2014  . Abnormal EKG 10/30/2014  . 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 Essential tremor 01/10/2014  . Parkinson's disease ([place]) 01/10/2014  . Migraine without aura 07/28/2013  . End stage renal disease ([place]) 07/18/2012  . Other complications due to renal dialysis device, implant, and graft 07/18/2012    Past Surgical History:  Procedure Laterality Date  . A/V FISTULAGRAM Left 05/31/2018   Procedure: A/V FISTULAGRAM;  Surgeon: Serafina Mitchell, MD;  Location: [facility];  Service: Cardiovascular;  Laterality: Left;  . APPENDECTOMY    . AV FISTULA PLACEMENT Left 12/03/2006   "forearm"  . AV FISTULA REPAIR Left 11/25/2009   "forearm"  . CARPAL TUNNEL RELEASE Right 02/11/2017   Procedure: RIGHT CARPAL TUNNEL RELEASE;  Surgeon: Daryll Brod, MD;  Location: [place];  Service: Orthopedics;  Laterality: Right;  . CHOLECYSTECTOMY OPEN  1970's  . COLONOSCOPY W/ POLYPECTOMY  07/30/2010   Minimal sigmoid diverticulosis. Small internal hemorrhoids. Otherwise normal colonoscopy to TI.   Marland Kitchen CORONARY ANGIOPLASTY    . CORONARY ANGIOPLASTY WITH STENT PLACEMENT    . ESOPHAGOGASTRODUODENOSCOPY  10/21/2012   Gastric polyps- status post polypectomy. Mild gastritis.   . INSERTION OF DIALYSIS CATHETER Right 2010   "chest"  . PARATHYROIDECTOMY  2010?   Parathyroid autotransplantation   . REVERSE SHOULDER ARTHROPLASTY Right 01/06/2018  . REVERSE SHOULDER ARTHROPLASTY Right 01/06/2018   Procedure: RIGHT REVERSE SHOULDER ARTHROPLASTY;  Surgeon: [name], MD;  Location: [facility];  Service: Orthopedics;  Laterality: Right;  . REVISON OF ARTERIOVENOUS FISTULA Left 12/04/2013   Procedure: REVISON OF ARTERIOVENOUS FISTULA;  Surgeon: Rosetta Posner, MD;  Location: [facility];  Service: Vascular;  Laterality: Left;  .  REVISON OF ARTERIOVENOUS FISTULA Left 0/08/5595   Procedure: PLICATION OF LEFT BRACHIOCEPHALIC  ARTERIOVENOUS FISTULA;  Surgeon: Conrad Vantage, MD;  Location: [facility];  Service: Vascular;  Laterality: Left;  . SHOULDER ARTHROSCOPY WITH ROTATOR CUFF REPAIR AND SUBACROMIAL DECOMPRESSION Right 06/22/2017   Procedure: RIGHT SHOULDER ARTHROSCOPY WITH EXTENSIVE DEBRIDEMENT;  Surgeon: Mcarthur Rossetti, MD;  Location: [place];  Service: Orthopedics;  Laterality: Right;  . TRIGGER FINGER RELEASE Right 10/22/2017   Procedure: RELEASE RIGHT INDEX TRIGGER FINGER/A-1 PULLEY;  Surgeon: Daryll Brod, MD;  Location: [place];  Service: Orthopedics;  Laterality: Right;  . TUBAL LIGATION  1989  . UMBILICAL HERNIA REPAIR  1990's X 2     OB History   No obstetric history on file.     Family History  Problem Relation Age of Onset  . Epilepsy Mother   . Diabetes Mother   . Cancer Mother   .  Alcohol abuse Father     Social History   Tobacco Use  . Smoking status: Former Smoker    Years: 3.00    Types: Cigarettes  . Smokeless tobacco: Never Used  . Tobacco comment: "stopped smoking in the 1970's"  Substance Use Topics  . Alcohol use: No  . Drug use: Not Currently    Types: Marijuana, Hydromorphone    Comment: as a teenager    Home Medications Prior to Admission medications   Medication Sig Start Date End Date Taking? Authorizing Provider  acetaminophen (TYLENOL) 500 MG tablet Take 500-1,000 mg by mouth every 6 (six) hours as needed (FOR PAIN.).    [provider]  aspirin EC 81 MG tablet Take 81 mg by mouth daily.     [provider]  carvedilol (COREG) 6.25 MG tablet Take 6.25 mg by mouth 2 (two) times daily with a meal.    [provider]  clonazePAM (KLONOPIN) 0.5 MG tablet TAKE 1 TABLET BY MOUTH AT BEDTIME Patient taking differently: Take 0.5 mg by mouth at bedtime.  05/15/19   Kathrynn Ducking, MD  dimenhyDRINATE (DRAMAMINE) 50 MG tablet Take 50 mg by mouth every 8  (eight) hours as needed (for dizziness/vertigo).    [provider]  doxycycline (VIBRAMYCIN) 100 MG capsule Take 1 capsule (100 mg total) by mouth 2 (two) times daily. 07/19/19   Davonna Belling, MD  escitalopram (LEXAPRO) 10 MG tablet Take 10 mg by mouth daily.  11/17/18   [provider]  ezetimibe (ZETIA) 10 MG tablet Take 10 mg by mouth every evening.  05/10/16   [provider]  fenofibrate 160 MG tablet Take 160 mg by mouth every evening.  03/08/17   [provider]  finasteride (PROSCAR) 5 MG tablet Take 2.5 mg by mouth daily.     [provider]  folic acid-vitamin b complex-vitamin c-selenium-zinc (DIALYVITE) 3 MG TABS tablet Take 1 tablet by mouth daily.    [provider]  HYDROcodone-acetaminophen (NORCO/VICODIN) 5-325 MG tablet Take 1-2 tablets by mouth every 6 (six) hours as needed for severe pain. 03/06/19   Bryant, Shawn C, PA-C  Lanthanum Carbonate (FOSRENOL) 1000 MG PACK Take 2,000 mg by mouth 3 (three) times daily.     [provider]  levothyroxine (SYNTHROID, LEVOTHROID) 50 MCG tablet Take 50 mcg by mouth daily before breakfast.     [provider]  lidocaine-prilocaine (EMLA) cream Apply 1 application topically See admin instructions. Apply a small amount to access site 1-2 hours before dialysis--cover with occlusive dressing 09/24/17   [provider]  loperamide (IMODIUM A-D) 2 MG tablet Take 2-4 mg by mouth every Monday, Wednesday, and Friday with hemodialysis.     [provider]  nortriptyline (PAMELOR) 10 MG capsule Take 2 capsules (20 mg total) by mouth at bedtime. 08/19/18   Kathrynn Ducking, MD  ondansetron (ZOFRAN ODT) 4 MG disintegrating tablet Take 1 tablet (4 mg total) by mouth every 8 (eight) hours as needed for nausea or vomiting. Patient not taking: Reported on 05/30/2019 05/18/19   Jackquline Denmark, MD  pantoprazole (PROTONIX) 40 MG tablet Take 1 tablet (40 mg total) by mouth 2 (two)  times daily. 05/18/19   Jackquline Denmark, MD  VICTOZA 18 MG/3ML SOPN Inject 18 mg into the skin at bedtime.  11/07/18   [provider]  Vitamin D, Ergocalciferol, (DRISDOL) 1.25 MG (50000 UT) CAPS capsule Take 50,000 Units by mouth every 30 (thirty) days. 04/22/19   [provider]    Allergies    Sulfa antibiotics, Amoxicillin, Crestor [rosuvastatin calcium], Ibuprofen, Naldecon senior [guaifenesin], Statins, Chlorphen-phenyleph-asa, and Gabapentin  Review of Systems   Review of Systems  Constitutional: Positive for appetite change and fatigue. Negative for chills and fever.  HENT: Negative for congestion, rhinorrhea and sore throat.   Eyes: Negative for visual disturbance.  Respiratory: Positive for shortness of breath. Negative for cough.   Cardiovascular: Negative for chest pain and leg swelling.  Gastrointestinal: Negative for abdominal pain, diarrhea, nausea and vomiting.  Genitourinary: Negative for dysuria.  Musculoskeletal: Negative for back pain and neck pain.  Skin: Negative for rash.  Neurological: Positive for speech difficulty, weakness and headaches. Negative for dizziness and light-headedness.  Hematological: Does not bruise/bleed easily.  Psychiatric/Behavioral: Negative for confusion.    Physical Exam Updated Vital Signs BP 121/66   Pulse 70   Temp 97.8 F (36.6 C) (Oral)   Resp 14   Ht 1.473 m (4\' 10" )   Wt 83.9 kg   SpO2 100%   BMI 38.67 kg/m   Physical Exam Vitals and nursing note reviewed.  Constitutional:      General: She is not in acute distress.    Appearance: Normal appearance. She is well-developed.  HENT:     Head: Normocephalic and atraumatic.  Eyes:     Extraocular Movements: Extraocular movements intact.     Conjunctiva/sclera: Conjunctivae normal.     Pupils: Pupils are equal, round, and reactive to light.  Cardiovascular:     Rate and Rhythm: Normal rate and regular rhythm.     Heart sounds: No murmur.  Pulmonary:      Effort: Pulmonary effort is normal. No respiratory distress.     Breath sounds: Normal breath sounds.  Abdominal:     Palpations: Abdomen is soft.     Tenderness: There is no abdominal tenderness.  Musculoskeletal:     Cervical back: Normal range of motion and neck supple.     Comments: AV fistula left arm.  Good thrill.  Crest skin tear.  Bilateral weakness to both lower extremities.  Skin:    General: Skin is warm and dry.  Neurological:     General: No focal deficit present.     Mental Status: She is alert and oriented to person, place, and time. Mental status is at baseline.     Cranial Nerves: Cranial nerve deficit present.     Sensory: No sensory deficit.     Motor: No weakness.     Comments: Speech is slightly slurred.  But she performs her words fairly well.  This is baseline.     ED Results / Procedures / Treatments   Labs (all labs ordered are listed, but only abnormal results are displayed) Labs Reviewed  CBC WITH DIFFERENTIAL/PLATELET - Abnormal; Notable for the following components:      Result Value   RBC 2.12 (*)    Hemoglobin 8.2 (*)    HCT 26.3 (*)    MCV 124.1 (*)    MCH 38.7 (*)    RDW 17.8 (*)    Platelets 140 (*)    All other components within normal limits  COMPREHENSIVE METABOLIC PANEL - Abnormal; Notable for the following components:   Potassium 3.2 (*)    Glucose, Bld 106 (*)    Creatinine, Ser 5.64 (*)    Calcium 8.0 (*)    Total Protein 5.5 (*)    Albumin 1.9 (*)    AST 51 (*)  GFR calc non Af Amer 7 (*)    GFR calc Af Amer 8 (*)    All other components within normal limits    EKG EKG Interpretation  Date/Time:  Wednesday August 09 2019 10:48:34 EST Ventricular Rate:  68 PR Interval:    QRS Duration: 94 QT Interval:  509 QTC Calculation: 542 R Axis:   24 Text Interpretation: Sinus rhythm Left atrial enlargement Borderline low voltage, extremity leads Nonspecific T abnormalities, lateral leads Prolonged QT interval Artifact Confirmed  by Fredia Sorrow [phone]) on 08/09/2019 11:07:38 AM   Radiology DG Chest Port 1 View  Result Date: 08/09/2019 CLINICAL DATA:  COVID, weakness EXAM: PORTABLE CHEST 1 VIEW COMPARISON:  07/19/2019 FINDINGS: Heart is upper limits normal in size. Bibasilar opacities could reflect atelectasis or infiltrates. Right mid lung opacity has resolved. No effusions or edema. No acute bony abnormality. IMPRESSION: Bibasilar opacities could reflect atelectasis or infiltrates. Electronically Signed   By: Rolm Baptise M.D.   On: 08/09/2019 11:56    Procedures Procedures (including critical care time)  Medications Ordered in ED Medications - No data to display  ED Course  I have reviewed the triage vital signs and the nursing notes.  Pertinent labs & imaging results that were available during my care of the patient were reviewed by me and considered in my medical decision making (see chart for details).    MDM Rules/Calculators/A&P                      Patient's chest x-ray still showing some Covid changes but her oxygen saturations are 100%.  No significant tachypnea or shortness of breath.  Feel that this is secondary and persistent to her Covid infection.  Labs without significant abnormalities no leukocytosis.  Hemoglobin down a little bit from where she has been 8.2.  But no history of any any blood loss.  Patient last dialyzed on Monday.  Contacted the dialysis show social worker Ms. Brigitte Pulse and that she is going to try to arrange dialysis tomorrow at [facility] where patient gets dialyzed.  Since patient has been bedridden for a period of time do not feel there is any significant changes in the social environment.  Patient will need to be monitored for her appetite.  But does not request meet criteria for admission today.  Vital signs here without any significant abnormalities.  No fever not tachycardic not hypotensive not tachypneic.  The dialysis social worker contacted [facility].  As per  dialysis been very concerned about increased weakness and feels that the family struggling to take care of the patient at home.  So we will get physical therapy and social worker involved here.  If patient is clear for discharge home they can dialyze her at the [facility] facility at 12 noon.  Final Clinical Impression(s) / ED Diagnoses Final diagnoses:  Pneumonia due to COVID-19 virus  Weakness generalized  ESRD on dialysis [facility])    Rx / DC Orders ED Discharge Orders    None       Fredia Sorrow, MD 08/09/19 1451

## 2019-08-09 NOTE — Telephone Encounter (Signed)
Looks like she is in the ER now. Likely needs to see PCP following ER visit.

## 2019-08-09 NOTE — Progress Notes (Signed)
Renal Navigator spoke with both of patient's son/Sergio and Tequesta regarding OP HD clinic staff's concerns about patient's current weakness and PT evaluation/recommendation for SNF for rehab. Both sons state that patient is much weaker lately, since having COVID. [name] states he is worried about patient's confusion lately also. They would like her to go to rehab, but neither son know if she is willing to go. Both sons are also open to [facility] if patient refuses SNF, but want the opportunity to talk with each other about the recommendation and want to talk with their mom to see if she is willing to go to rehab. [name] is here at the hospital now and would like to talk with his mother in person before leaving her here overnight. 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 He requests to see her. Navigator unsure of ED visitation policies at this time and sent message to bedside RN, CM and CSW. Renal Navigator sent message updating OP HD clinic to hold patient's rescheduled appointment for tomorrow in the event that she refuses SNF recommendation and is discharged home this evening.  Alphonzo Cruise, [place] Renal Navigator [phone]

## 2019-08-09 NOTE — Evaluation (Signed)
Physical Therapy Evaluation Patient Details Name: Roberta Bryant MRN: [id number] DOB: 1951-01-01 Today's Date: 08/09/2019   History of Present Illness  69yo female who missed HD today due to a fall at home without injury. Hx of covid diagnosed 07/13/19. Per chart review, family has been having a harder and harder time caring for her. PMH CVA 2019, aortic stenosis, CAD, HOH, ESRD on HD, HLD, HTN, MI, DM, reverse total shoulder R  Clinical Impression   Patient received in bed, very pleasant and willing to participate in session. Somewhat tangential and required cues for redirection throughout session. Allowed PT to check MMT- ankle DF 4/5, quad 2+/5, hamstring 2/5, hip flexor 2/5, plantar flexor 3-/5- but refused EOB/OOB mobility due to fear of falling. Per her report she is totalA from her sons at home and she states "they are quite a bit bigger than me too". Will require +2 for safe progression of mobility. From her report and from information in the chart, seems that it has become difficult for family to provide care at home and she has been getting progressively weaker. Currently recommend SNF and 24/7A prior to return home.     Follow Up Recommendations SNF;Supervision/Assistance - 24 hour    Equipment Recommendations  Rolling walker with 5" wheels;3in1 (PT)    Recommendations for Other Services       Precautions / Restrictions Precautions Precautions: Fall Restrictions Weight Bearing Restrictions: No      Mobility  Bed Mobility               General bed mobility comments: refused- fear of falling, will need +2 for safety  Transfers                 General transfer comment: refused- fear of falling, will need +2 for safety. TotalA from sons per her report.  Ambulation/Gait             General Gait Details: refused- fear of falling, will need +2 for safety. Not ambulatory for months per chart.  Stairs            Wheelchair Mobility    Modified  [place]in (Stroke Patients Only)       Balance Overall balance assessment: History of Falls                                           Pertinent Vitals/Pain Pain Assessment: No/denies pain    Home Living Family/patient expects to be discharged to:: Private residence Living Arrangements: Children Available Help at Discharge: Family;Available PRN/intermittently Type of Home: House Home Access: Ramped entrance     Home Layout: One level Home Equipment: Walker - standard;Wheelchair - [employer];Shower seat - built in;Grab bars - tub/shower Additional Comments: lives with 2 sons who work opposite shifts and has assist 75% of the time per prior charting, her report today still consistent with this    Prior Function Level of Independence: Needs assistance   Gait / Transfers Assistance Needed: now totalA and dependent on sons for mobility  ADL's / Homemaking Assistance Needed: total!        Hand Dominance        Extremity/Trunk Assessment   Upper Extremity Assessment Upper Extremity Assessment: Defer to OT evaluation    Lower Extremity Assessment Lower Extremity Assessment: RLE deficits/detail;LLE deficits/detail RLE Deficits / Details: ankle DF 4/5, quad 2+/5, hamstring 2/5, hip flexor  2/5, plantar flexor 3-/5 RLE Sensation: decreased proprioception RLE Coordination: decreased gross motor LLE Deficits / Details: ankle DF 4/5, quad 2+/5, hamstring 2/5, hip flexor 2/5, plantar flexor 3-/5 LLE Sensation: decreased proprioception LLE Coordination: decreased gross motor    Cervical / Trunk Assessment Cervical / Trunk Assessment: Kyphotic  Communication   Communication: HOH  Cognition Arousal/Alertness: Awake/alert Behavior During Therapy: WFL for tasks assessed/performed Overall Cognitive Status: Impaired/Different from baseline                                 General Comments: some difficulty with word finding, very tangential  and required redirection but mostly appropriate with PT. Some slow processing.      General Comments General comments (skin integrity, edema, etc.): refused EOB/OOB for balance assessment    Exercises     Assessment/Plan    PT Assessment Patient needs continued PT services  PT Problem List Decreased strength;Decreased cognition;Decreased knowledge of use of DME;Obesity;Decreased activity tolerance;Decreased safety awareness;Decreased balance;Decreased mobility       PT Treatment Interventions DME instruction;Balance training;Gait training;Neuromuscular re-education;Stair training;Cognitive remediation;Functional mobility training;Patient/family education;Therapeutic activities;Therapeutic exercise;Wheelchair mobility training    PT Goals (Current goals can be found in the Care Plan section)  Acute Rehab PT Goals Patient Stated Goal: get stronger and be able to walk again PT Goal Formulation: With patient Time For Goal Achievement: 08/23/19 Potential to Achieve Goals: Fair    Frequency Min 2X/week   Barriers to discharge        Co-evaluation               AM-PAC PT "6 Clicks" Mobility  Outcome Measure Help needed turning from your back to your side while in a flat bed without using bedrails?: A Lot Help needed moving from lying on your back to sitting on the side of a flat bed without using bedrails?: A Lot Help needed moving to and from a bed to a chair (including a wheelchair)?: Total Help needed standing up from a chair using your arms (e.g., wheelchair or bedside chair)?: Total Help needed to walk in hospital room?: Total Help needed climbing 3-5 steps with a railing? : Total 6 Click Score: 8    End of Session   Activity Tolerance: Patient tolerated treatment well Patient left: in bed;with call bell/phone within reach(on stretcher)   PT Visit Diagnosis: Unsteadiness on feet (R26.81);Muscle weakness (generalized) (M62.81);History of falling (Z91.81);Other  abnormalities of gait and mobility (R26.89);Difficulty in walking, not elsewhere classified (R26.2)    Time: 0354-6568 PT Time Calculation (min) (ACUTE ONLY): 23 min   Charges:   PT Evaluation $PT Eval Moderate Complexity: 1 Mod PT Treatments $Therapeutic Activity: 8-22 mins        Windell Norfolk, DPT, PN1   Supplemental Physical Therapist [place]    Pager [phone] Acute Rehab Office [phone]

## 2019-08-09 NOTE — ED Notes (Signed)
Called ptar for pt transport  

## 2019-08-09 NOTE — ED Triage Notes (Addendum)
Pt. presents from home via EMS, Awake and alert x 4, C/O Generalized weakness, temporal headache and poor nutritional intake for the past week. Pt. Relates testing positive COVID on 07/13/2019 was treated for pneumonia and sent home with follow-up care. Hx CVA 2019.

## 2019-08-09 NOTE — Care Management (Signed)
EDCM spoke with patient and son [place] at bedside to discuss transitional care planning.  discussed care goals SNF vs. [facility] services with patient and son.  Patient states she does not want to go to a SNF, although son believes she would benefit from Rehab. CM explained that [facility] can be set up for reconditioning with a SW for possible SNF placement.  The son states, "I can not take off of work to get you to dialysis".   Patient and son had a discussion and patient states, she is going home and she wants [facility] services.  Offered choice, son states, he believes she had [facility] in the past. ED CM called referral in to [facility] Nurse liaison who accepted referral. Explained that a nurse will contact them at verified number 24-48 hours post discharge from the ED patient and son verbalizes understanding teach back done. No further ED TOC needs identified.

## 2019-08-09 NOTE — ED Notes (Signed)
Son- [phone]- would like pt updates

## 2019-08-09 NOTE — Discharge Instructions (Signed)
Continue medications as previously prescribed.  Follow-up with dialysis as scheduled.  Return to the ER if symptoms significantly worsen or change.

## 2019-08-09 NOTE — Progress Notes (Signed)
Renal Navigator received call from EDP/Dr. Rogene Houston regarding patient's missed HD due to being in the ER today. Renal Navigator contacted Agricultural consultant at [facility] who states their team is concerned that patient has not been back to baseline functioning since being diagnosed with COVID on 07/13/19 and are also concerned that patient may not be able to speak up for herself due to her illness/weakness. They feel the family may be having a difficult time caring for her at home.  Per OP HD clinic Charge RN, if patient is deemed stable for discharge home, they will accommodate her need for HD treatment tomorrow, Thursday, 08/10/19. She will need to arrive to the clinic at 12:00pm.  Renal Navigator following for disposition.  Alphonzo Cruise, [place] Renal Navigator [phone]

## 2019-08-09 NOTE — ED Notes (Addendum)
Patient's son, Alfonso Patten, contacted; He will be picking up pt. From ED. ETA 1630.

## 2019-08-10 NOTE — Telephone Encounter (Signed)
I called son, [name] back.  He stated that pt was discharged from ED yesterday will be getting inhome therapy.  Will f/u with pcp for new problems memory loss, (will see if better since see in ED was dehydrated per son). I pcp thinks needs to see [place] will refer back to [place] for new problem.  He verbalized understanding.

## 2019-08-12 ENCOUNTER — Other Ambulatory Visit: Payer: Self-pay | Admitting: Neurology

## 2019-08-14 ENCOUNTER — Other Ambulatory Visit: Payer: Self-pay

## 2019-08-14 MED ORDER — NORTRIPTYLINE HCL 10 MG PO CAPS: 20.0000 mg | ORAL_CAPSULE | Freq: Every day | ORAL | 3 refills | Status: AC

## 2019-08-23 ENCOUNTER — Other Ambulatory Visit: Payer: Self-pay

## 2019-08-23 ENCOUNTER — Emergency Department (HOSPITAL_COMMUNITY)
Admission: EM | Admit: 2019-08-23 | Discharge: 2019-08-23 | Disposition: A | Discharge status: Home / Self Care | Payer: Medicare Other | Attending: Emergency Medicine | Admitting: Emergency Medicine

## 2019-08-23 ENCOUNTER — Encounter (HOSPITAL_COMMUNITY): Payer: Self-pay | Admitting: Emergency Medicine

## 2019-08-23 ENCOUNTER — Emergency Department (HOSPITAL_COMMUNITY): Payer: Medicare Other

## 2019-08-23 DIAGNOSIS — Z87891 Personal history of nicotine dependence: Secondary | ICD-10-CM | POA: Diagnosis not present

## 2019-08-23 DIAGNOSIS — Z9049 Acquired absence of other specified parts of digestive tract: Secondary | ICD-10-CM | POA: Diagnosis not present

## 2019-08-23 DIAGNOSIS — I252 Old myocardial infarction: Secondary | ICD-10-CM | POA: Diagnosis not present

## 2019-08-23 DIAGNOSIS — E039 Hypothyroidism, unspecified: Secondary | ICD-10-CM | POA: Diagnosis not present

## 2019-08-23 DIAGNOSIS — G20 Parkinson's disease: Secondary | ICD-10-CM | POA: Insufficient documentation

## 2019-08-23 DIAGNOSIS — Z992 Dependence on renal dialysis: Secondary | ICD-10-CM | POA: Diagnosis not present

## 2019-08-23 DIAGNOSIS — R404 Transient alteration of awareness: Secondary | ICD-10-CM

## 2019-08-23 DIAGNOSIS — Z7984 Long term (current) use of oral hypoglycemic drugs: Secondary | ICD-10-CM | POA: Diagnosis not present

## 2019-08-23 DIAGNOSIS — I509 Heart failure, unspecified: Secondary | ICD-10-CM | POA: Diagnosis not present

## 2019-08-23 DIAGNOSIS — E1122 Type 2 diabetes mellitus with diabetic chronic kidney disease: Secondary | ICD-10-CM | POA: Diagnosis not present

## 2019-08-23 DIAGNOSIS — N186 End stage renal disease: Secondary | ICD-10-CM | POA: Diagnosis not present

## 2019-08-23 DIAGNOSIS — Z7982 Long term (current) use of aspirin: Secondary | ICD-10-CM | POA: Insufficient documentation

## 2019-08-23 DIAGNOSIS — R5383 Other fatigue: Secondary | ICD-10-CM | POA: Diagnosis not present

## 2019-08-23 DIAGNOSIS — I132 Hypertensive heart and chronic kidney disease with heart failure and with stage 5 chronic kidney disease, or end stage renal disease: Secondary | ICD-10-CM | POA: Insufficient documentation

## 2019-08-23 DIAGNOSIS — Z79899 Other long term (current) drug therapy: Secondary | ICD-10-CM | POA: Diagnosis not present

## 2019-08-23 DIAGNOSIS — Z8673 Personal history of transient ischemic attack (TIA), and cerebral infarction without residual deficits: Secondary | ICD-10-CM | POA: Diagnosis not present

## 2019-08-23 DIAGNOSIS — Z955 Presence of coronary angioplasty implant and graft: Secondary | ICD-10-CM | POA: Insufficient documentation

## 2019-08-23 DIAGNOSIS — I2510 Atherosclerotic heart disease of native coronary artery without angina pectoris: Secondary | ICD-10-CM | POA: Diagnosis not present

## 2019-08-23 DIAGNOSIS — R4182 Altered mental status, unspecified: Secondary | ICD-10-CM | POA: Diagnosis present

## 2019-08-23 LAB — POCT I-STAT EG7
Acid-Base Excess: 5 mmol/L — ABNORMAL HIGH (ref 0.0–2.0)
Bicarbonate: 30.2 mmol/L — ABNORMAL HIGH (ref 20.0–28.0)
Calcium, Ion: 1.08 mmol/L — ABNORMAL LOW (ref 1.15–1.40)
HCT: 26 % — ABNORMAL LOW (ref 36.0–46.0)
Hemoglobin: 8.8 g/dL — ABNORMAL LOW (ref 12.0–15.0)
O2 Saturation: 43 %
Potassium: 3.3 mmol/L — ABNORMAL LOW (ref 3.5–5.1)
Sodium: 136 mmol/L (ref 135–145)
TCO2: 32 mmol/L (ref 22–32)
pCO2, Ven: 47.5 mmHg (ref 44.0–60.0)
pH, Ven: 7.411 (ref 7.250–7.430)
pO2, Ven: 24 mmHg — CL (ref 32.0–45.0)

## 2019-08-23 LAB — BASIC METABOLIC PANEL
Anion gap: 14 (ref 5–15)
BUN: 18 mg/dL (ref 8–23)
CO2: 22 mmol/L (ref 22–32)
Calcium: 8.2 mg/dL — ABNORMAL LOW (ref 8.9–10.3)
Chloride: 98 mmol/L (ref 98–111)
Creatinine, Ser: 6.63 mg/dL — ABNORMAL HIGH (ref 0.44–1.00)
GFR calc Af Amer: 7 mL/min — ABNORMAL LOW (ref >60)
GFR calc non Af Amer: 6 mL/min — ABNORMAL LOW (ref >60)
Glucose, Bld: 113 mg/dL — ABNORMAL HIGH (ref 70–99)
Potassium: 3.4 mmol/L — ABNORMAL LOW (ref 3.5–5.1)
Sodium: 134 mmol/L — ABNORMAL LOW (ref 135–145)

## 2019-08-23 LAB — CBC
HCT: 25.2 % — ABNORMAL LOW (ref 36.0–46.0)
Hemoglobin: 7.8 g/dL — ABNORMAL LOW (ref 12.0–15.0)
MCH: 40.4 pg — ABNORMAL HIGH (ref 26.0–34.0)
MCHC: 31 g/dL (ref 30.0–36.0)
MCV: 130.6 fL — ABNORMAL HIGH (ref 80.0–100.0)
Platelets: 182 10*3/uL (ref 150–400)
RBC: 1.93 MIL/uL — ABNORMAL LOW (ref 3.87–5.11)
RDW: 19.6 % — ABNORMAL HIGH (ref 11.5–15.5)
WBC: 5 10*3/uL (ref 4.0–10.5)
nRBC: 0 % (ref 0.0–0.2)

## 2019-08-23 MED ORDER — SODIUM CHLORIDE 0.9% FLUSH: 3.0000 mL | Freq: Once | INTRAVENOUS | Status: DC

## 2019-08-23 NOTE — ED Provider Notes (Signed)
[place] EMERGENCY DEPARTMENT Provider Note   CSN: [id number] Arrival date & time: 08/23/19  1421     History Chief Complaint  Patient presents with  . Weakness    Roberta Bryant is a 69 y.o. female.  69 yo F with a chief complaints of altered mental status.  Patient was reportedly at dialysis and she had a staring spell where they felt she was unresponsive.  Upon EMS arrival she apparently was responsive but is slow to respond.  Patient states that she has been generally fatigued for the past couple months since she had the coronavirus.  States that she is slowly getting better but started having nausea and diarrhea over the past 48 hours or so.  Made it through about 50 minutes of dialysis the day before this event.  She denies chest pain or shortness of breath denies headache or neck pain.  Denies new unilateral numbness or weakness denies difficulty with speech or swallowing.  She feels better now than she did before but does not totally remember the event that occurred.  The history is provided by the patient and the EMS personnel.  Weakness Severity:  Moderate Onset quality:  Gradual Duration:  2 minutes Timing:  Constant Progression:  Unchanged Chronicity:  New Relieved by:  Nothing Worsened by:  Nothing Ineffective treatments:  None tried Associated symptoms: no arthralgias, no chest pain, no dizziness, no dysuria, no fever, no headaches, no myalgias, no nausea, no shortness of breath, no urgency and no vomiting        Past Medical History:  Diagnosis Date  . Anemia   . Aortic stenosis   . CHF (congestive heart failure) ([place])   . Childhood asthma   . Chronic upper back pain   . Coronary artery disease    Stent to left main coronary artery second of August 2017  . Decreased hearing   . ESRD (end stage renal disease) on dialysis [facility])    "MWF; [facility] in [place]" (01/06/2018)  . Gait abnormality 08/28/2016  . GERD (gastroesophageal reflux  disease)   . Headache    "a few times/week" (10/30/2014)  . Headache syndrome 01/08/2017  . Heart murmur   . History of blood transfusion 1996   "related to menses"  . Hyperlipidemia   . Hypertension   . Hypothyroidism   . IBS (irritable bowel syndrome)   . Myocardial infarction ([place]) 2004  . Osteoarthritis   . Pneumonia    "years ago"  . Rotator cuff arthropathy of right shoulder   . Spondylosis of cervical spine 06/16/2017  . Stroke ([place]) 07/2016   mini stroke , right side of body weak-   . Type II diabetes mellitus [facility])     Patient Active Problem List   Diagnosis Date Noted  . CVA (cerebral vascular accident) ([place]) 05/30/2019  . Shoulder arthritis 01/06/2018  . Rotator cuff arthropathy of right shoulder   . Status post arthroscopy of right shoulder 06/22/2017  . Spondylosis of cervical spine 06/16/2017  . Trigger index finger of right hand 06/11/2017  . Aortic stenosis 04/12/2017  . Coronary artery diseaseStatus post PTCA and stenting with drug-eluting stent to left main coronary artery in the summer 2017 04/12/2017  . 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 Decreased ROM of finger 03/01/2017  . Decreased ROM of wrist 03/01/2017  . Pain in finger of right hand 03/01/2017  . Headache syndrome 01/08/2017  . Right carpal tunnel syndrome 12/14/2016  . Complete tear of right rotator cuff 10/21/2016  .  Gait abnormality 08/28/2016  . Nasal injury, initial encounter 07/01/2016  . Nasal septal deviation 07/01/2016  . Nasal turbinate hypertrophy 07/01/2016  . Abnormal stress test 01/01/2016  . Chest pain 11/23/2015  . ESRD on dialysis ([place])   . Type 2 diabetes mellitus with complication ([place])   . Essential hypertension   . Dyslipidemia 12/24/2014  . Hemodialysis patient ([place]) 12/24/2014  . Abnormal ECG   . Pain in the chest   . Hypotension 10/30/2014  . Abnormal EKG 10/30/2014  . 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 Essential tremor 01/10/2014  . Parkinson's  disease ([place]) 01/10/2014  . Migraine without aura 07/28/2013  . End stage renal disease ([place]) 07/18/2012  . Other complications due to renal dialysis device, implant, and graft 07/18/2012    Past Surgical History:  Procedure Laterality Date  . A/V FISTULAGRAM Left 05/31/2018   Procedure: A/V FISTULAGRAM;  Surgeon: Serafina Mitchell, MD;  Location: [place] CV LAB;  Service: Cardiovascular;  Laterality: Left;  . APPENDECTOMY    . AV FISTULA PLACEMENT Left 12/03/2006   "forearm"  . AV FISTULA REPAIR Left 11/25/2009   "forearm"  . CARPAL TUNNEL RELEASE Right 02/11/2017   Procedure: RIGHT CARPAL TUNNEL RELEASE;  Surgeon: Daryll Brod, MD;  Location: [place];  Service: Orthopedics;  Laterality: Right;  . CHOLECYSTECTOMY OPEN  1970's  . COLONOSCOPY W/ POLYPECTOMY  07/30/2010   Minimal sigmoid diverticulosis. Small internal hemorrhoids. Otherwise normal colonoscopy to TI.   Marland Kitchen CORONARY ANGIOPLASTY    . CORONARY ANGIOPLASTY WITH STENT PLACEMENT    . ESOPHAGOGASTRODUODENOSCOPY  10/21/2012   Gastric polyps- status post polypectomy. Mild gastritis.   . INSERTION OF DIALYSIS CATHETER Right 2010   "chest"  . PARATHYROIDECTOMY  2010?   Parathyroid autotransplantation   . REVERSE SHOULDER ARTHROPLASTY Right 01/06/2018  . REVERSE SHOULDER ARTHROPLASTY Right 01/06/2018   Procedure: RIGHT REVERSE SHOULDER ARTHROPLASTY;  Surgeon: [name], MD;  Location: [place];  Service: Orthopedics;  Laterality: Right;  . REVISON OF ARTERIOVENOUS FISTULA Left 12/04/2013   Procedure: REVISON OF ARTERIOVENOUS FISTULA;  Surgeon: Rosetta Posner, MD;  Location: [place];  Service: Vascular;  Laterality: Left;  . REVISON OF ARTERIOVENOUS FISTULA Left 06/13/1094   Procedure: PLICATION OF LEFT BRACHIOCEPHALIC  ARTERIOVENOUS FISTULA;  Surgeon: Conrad Town Creek, MD;  Location: [place];  Service: Vascular;  Laterality: Left;  . SHOULDER ARTHROSCOPY WITH ROTATOR CUFF REPAIR AND SUBACROMIAL DECOMPRESSION Right  06/22/2017   Procedure: RIGHT SHOULDER ARTHROSCOPY WITH EXTENSIVE DEBRIDEMENT;  Surgeon: Mcarthur Rossetti, MD;  Location: [place];  Service: Orthopedics;  Laterality: Right;  . TRIGGER FINGER RELEASE Right 10/22/2017   Procedure: RELEASE RIGHT INDEX TRIGGER FINGER/A-1 PULLEY;  Surgeon: Daryll Brod, MD;  Location: [place];  Service: Orthopedics;  Laterality: Right;  . TUBAL LIGATION  1989  . UMBILICAL HERNIA REPAIR  1990's X 2     OB History   No obstetric history on file.     Family History  Problem Relation Age of Onset  . Epilepsy Mother   . Diabetes Mother   . Cancer Mother   . Alcohol abuse Father     Social History   Tobacco Use  . Smoking status: Former Smoker    Years: 3.00    Types: Cigarettes  . Smokeless tobacco: Never Used  . Tobacco comment: "stopped smoking in the 1970's"  Substance Use Topics  . Alcohol use: No  . Drug use: Not Currently    Types: Marijuana,  Hydromorphone    Comment: as a teenager    Home Medications Prior to Admission medications   Medication Sig Start Date End Date Taking? Authorizing Provider  acetaminophen (TYLENOL) 500 MG tablet Take 500-1,000 mg by mouth every 6 (six) hours as needed (FOR PAIN.).   Yes [provider]  aspirin EC 81 MG tablet Take 81 mg by mouth daily.    Yes [provider]  calcium carbonate (TUMS - DOSED IN MG ELEMENTAL CALCIUM) 500 MG chewable tablet Chew 1 tablet by mouth daily as needed for indigestion or heartburn.   Yes [provider]  carvedilol (COREG) 6.25 MG tablet Take 6.25 mg by mouth 2 (two) times daily with a meal.   Yes [provider]  clonazePAM (KLONOPIN) 0.5 MG tablet TAKE 1 TABLET BY MOUTH AT BEDTIME Patient taking differently: Take 0.5 mg by mouth at bedtime.  05/15/19  Yes Kathrynn Ducking, MD  ezetimibe (ZETIA) 10 MG tablet Take 10 mg by mouth every evening.  05/10/16  Yes [provider]  fenofibrate 160 MG tablet Take 160 mg by mouth every  evening.  03/08/17  Yes [provider]  folic acid-vitamin b complex-vitamin c-selenium-zinc (DIALYVITE) 3 MG TABS tablet Take 1 tablet by mouth daily.   Yes [provider]  Lanthanum Carbonate (FOSRENOL) 1000 MG PACK Take 2,000 mg by mouth 3 (three) times daily.    Yes [provider]  levothyroxine (SYNTHROID, LEVOTHROID) 50 MCG tablet Take 50 mcg by mouth daily before breakfast.    Yes [provider]  lidocaine-prilocaine (EMLA) cream Apply 1 application topically See admin instructions. Apply a small amount to access site 1-2 hours before dialysis--cover with occlusive dressing 09/24/17  Yes [provider]  loperamide (IMODIUM A-D) 2 MG tablet Take 2-4 mg by mouth every Monday, Wednesday, and Friday with hemodialysis.    Yes [provider]  nortriptyline (PAMELOR) 10 MG capsule Take 2 capsules (20 mg total) by mouth at bedtime. 08/14/19  Yes Kathrynn Ducking, MD  omeprazole (PRILOSEC) 20 MG capsule Take 20 mg by mouth in the morning and at bedtime.   Yes [provider]  sitaGLIPtin (JANUVIA) 25 MG tablet Take 25 mg by mouth daily.   Yes [provider]  Vitamin D, Ergocalciferol, (DRISDOL) 1.25 MG (50000 UT) CAPS capsule Take 50,000 Units by mouth every 30 (thirty) days. 04/22/19  Yes [provider]  Zinc 50 MG TABS Take 1 tablet by mouth daily.   Yes [provider]  doxycycline (VIBRAMYCIN) 100 MG capsule Take 1 capsule (100 mg total) by mouth 2 (two) times daily. Patient not taking: Reported on 08/23/2019 07/19/19   Davonna Belling, MD  HYDROcodone-acetaminophen (NORCO/VICODIN) 5-325 MG tablet Take 1-2 tablets by mouth every 6 (six) hours as needed for severe pain. Patient not taking: Reported on 08/23/2019 03/06/19   Joy, Shawn C, PA-C  ondansetron (ZOFRAN ODT) 4 MG disintegrating tablet Take 1 tablet (4 mg total) by mouth every 8 (eight) hours as needed for nausea or vomiting. Patient not taking:  Reported on 05/30/2019 05/18/19   Jackquline Denmark, MD  pantoprazole (PROTONIX) 40 MG tablet Take 1 tablet (40 mg total) by mouth 2 (two) times daily. Patient not taking: Reported on 08/23/2019 05/18/19   Jackquline Denmark, MD    Allergies    Sulfa antibiotics, Amoxicillin, Crestor [rosuvastatin calcium], Ibuprofen, Naldecon senior [guaifenesin], Statins, Chlorphen-phenyleph-asa, and Gabapentin  Review of Systems   Review of Systems  Constitutional: Negative for chills and fever.  HENT: Negative for congestion and rhinorrhea.   Eyes: Negative for redness and visual disturbance.  Respiratory: Negative for shortness of breath and wheezing.   Cardiovascular: Negative for chest pain and palpitations.  Gastrointestinal: Negative for nausea and vomiting.  Genitourinary: Negative for dysuria and urgency.  Musculoskeletal: Negative for arthralgias and myalgias.  Skin: Negative for pallor and wound.  Neurological: Positive for weakness. Negative for dizziness and headaches.    Physical Exam Updated Vital Signs BP (!) 112/51   Pulse 64   Temp 97.6 F (36.4 C) (Oral)   Resp 16   SpO2 100%   Physical Exam Vitals and nursing note reviewed.  Constitutional:      General: She is not in acute distress.    Appearance: She is well-developed. She is not diaphoretic.     Comments: Sleepy on exam.  HENT:     Head: Normocephalic and atraumatic.  Eyes:     Pupils: Pupils are equal, round, and reactive to light.  Cardiovascular:     Rate and Rhythm: Normal rate and regular rhythm.     Heart sounds: No murmur. No friction rub. No gallop.   Pulmonary:     Effort: Pulmonary effort is normal.     Breath sounds: No wheezing or rales.  Abdominal:     General: There is no distension.     Palpations: Abdomen is soft.     Tenderness: There is no abdominal tenderness.  Musculoskeletal:        General: No tenderness.     Cervical back: Normal range of motion and neck supple.  Skin:    General: Skin is  warm and dry.  Neurological:     Mental Status: She is alert and oriented to person, place, and time.  Psychiatric:        Behavior: Behavior normal.     ED Results / Procedures / Treatments   Labs (all labs ordered are listed, but only abnormal results are displayed) Labs Reviewed  BASIC METABOLIC PANEL - Abnormal; Notable for the following components:      Result Value   Sodium 134 (*)    Potassium 3.4 (*)    Glucose, Bld 113 (*)    Creatinine, Ser 6.63 (*)    Calcium 8.2 (*)    GFR calc non Af Amer 6 (*)    GFR calc Af Amer 7 (*)    All other components within normal limits  CBC - Abnormal; Notable for the following components:   RBC 1.93 (*)    Hemoglobin 7.8 (*)    HCT 25.2 (*)    MCV 130.6 (*)    MCH 40.4 (*)    RDW 19.6 (*)    All other components within normal limits  URINALYSIS, ROUTINE W REFLEX MICROSCOPIC  CBG MONITORING, ED    EKG EKG Interpretation  Date/Time:  Wednesday August 23 2019 14:28:11 EDT Ventricular Rate:  66 PR Interval:  176 QRS Duration: 90 QT Interval:  450 QTC Calculation: 471 R Axis:   24 Text Interpretation: Normal sinus rhythm Nonspecific T wave abnormality Abnormal ECG No significant change since last tracing Confirmed by Deno Etienne [phone]) on 08/23/2019 3:29:10 PM   Radiology No results found.  Procedures Procedures (including critical care time)  Medications Ordered in ED Medications  sodium chloride flush (NS) 0.9 % injection 3 mL (has no administration in time range)    ED Course  I have reviewed the triage vital signs and the nursing notes.  Pertinent labs &  imaging results that were available during my care of the patient were reviewed by me and considered in my medical decision making (see chart for details).    MDM Rules/Calculators/A&P                      69 yo F with a chief complaints of reported altered mental status.  Patient is conversant with me and is a bit sleepy.  She is unable to explain why she is  so tired.  States been this way since she had the coronavirus which she was diagnosed with about a month ago.  She otherwise appears chronically ill.  Will obtain a CT scan of the head.  Venous blood gas to evaluate for hypercarbia.  Her BMP is returned without hyperkalemia or significant acidosis.  She has had a slow downward trending of her hemoglobin.  No abdominal pain.   I am unsure the etiology of the staring spell that she had today.  Reportedly was coherent but slow with EMS.  Likely will have her follow-up with her family doctor and neurologist.  Patient care was signed out to Dr. Sedonia Small, please see his note for further details of care in the ED.  The patients results and plan were reviewed and discussed.   Any x-rays performed were independently reviewed by myself.   Differential diagnosis were considered with the presenting HPI.  Medications  sodium chloride flush (NS) 0.9 % injection 3 mL (has no administration in time range)    Vitals:   08/23/19 1442  BP: (!) 112/51  Pulse: 64  Resp: 16  Temp: 97.6 F (36.4 C)  TempSrc: Oral  SpO2: 100%    Final diagnoses:  Staring episodes     Final Clinical Impression(s) / ED Diagnoses Final diagnoses:  Staring episodes    Rx / DC Orders ED Discharge Orders    None       Deno Etienne, DO 08/23/19 1631

## 2019-08-23 NOTE — ED Provider Notes (Signed)
  Provider Note MRN:  [id number]  Arrival date & time: 08/23/19    ED Course and Medical Decision Making  Assumed care from Dr. Tyrone Nine at shift change.  Chronic increased fatigue for the past few months, awaiting CT head, urinalysis, anticipating discharge.  Upon reassessment patient is feeling well, normal vital signs, is well-appearing, neurologically intact.  Work-up is reassuring, she is appropriate for discharge with outpatient follow-up.  Spoke with son who is agreeable with plan.  Procedures  Final Clinical Impressions(s) / ED Diagnoses     ICD-10-CM   1. Staring episodes  R40.4   2. Fatigue, unspecified type  R53.83     ED Discharge Orders    None        Discharge Instructions     Follow up with your family doc. Follow up with your neurologist.      Barth Kirks. Sedonia Small, [place] [email or web address]    Maudie Flakes, MD 08/23/19 [phone]

## 2019-08-23 NOTE — Discharge Instructions (Addendum)
Follow up with your family doc. Follow up with your neurologist.

## 2019-08-23 NOTE — ED Triage Notes (Signed)
Pt from [facility] called for pt being unconscious.  On EMS arrival they reported pt was starring into space and wouldn't talk.  Pt reports generalized weakness.  Pt had COVID 2/6.  Missed dialysis Monday and only had 50 min of dialysis today.  CBG 114

## 2019-08-27 ENCOUNTER — Encounter (HOSPITAL_COMMUNITY): Payer: Self-pay | Admitting: Emergency Medicine

## 2019-08-27 ENCOUNTER — Inpatient Hospital Stay (HOSPITAL_COMMUNITY)
Admission: EM | Admit: 2019-08-27 | Discharge: 2019-10-07 | DRG: 329 | Disposition: E | Payer: Medicare Other | Attending: Internal Medicine | Admitting: Internal Medicine

## 2019-08-27 ENCOUNTER — Other Ambulatory Visit: Payer: Self-pay

## 2019-08-27 ENCOUNTER — Emergency Department (HOSPITAL_COMMUNITY): Payer: Medicare Other

## 2019-08-27 DIAGNOSIS — J9811 Atelectasis: Secondary | ICD-10-CM | POA: Diagnosis present

## 2019-08-27 DIAGNOSIS — K2980 Duodenitis without bleeding: Secondary | ICD-10-CM | POA: Diagnosis present

## 2019-08-27 DIAGNOSIS — M1990 Unspecified osteoarthritis, unspecified site: Secondary | ICD-10-CM | POA: Diagnosis present

## 2019-08-27 DIAGNOSIS — M65321 Trigger finger, right index finger: Secondary | ICD-10-CM | POA: Diagnosis present

## 2019-08-27 DIAGNOSIS — K589 Irritable bowel syndrome without diarrhea: Secondary | ICD-10-CM | POA: Diagnosis present

## 2019-08-27 DIAGNOSIS — K635 Polyp of colon: Secondary | ICD-10-CM | POA: Diagnosis present

## 2019-08-27 DIAGNOSIS — K567 Ileus, unspecified: Secondary | ICD-10-CM | POA: Diagnosis not present

## 2019-08-27 DIAGNOSIS — E669 Obesity, unspecified: Secondary | ICD-10-CM | POA: Diagnosis present

## 2019-08-27 DIAGNOSIS — J9601 Acute respiratory failure with hypoxia: Secondary | ICD-10-CM | POA: Diagnosis not present

## 2019-08-27 DIAGNOSIS — I5032 Chronic diastolic (congestive) heart failure: Secondary | ICD-10-CM | POA: Diagnosis present

## 2019-08-27 DIAGNOSIS — R4182 Altered mental status, unspecified: Secondary | ICD-10-CM

## 2019-08-27 DIAGNOSIS — Z515 Encounter for palliative care: Secondary | ICD-10-CM | POA: Diagnosis not present

## 2019-08-27 DIAGNOSIS — R54 Age-related physical debility: Secondary | ICD-10-CM | POA: Diagnosis present

## 2019-08-27 DIAGNOSIS — G92 Toxic encephalopathy: Secondary | ICD-10-CM | POA: Diagnosis not present

## 2019-08-27 DIAGNOSIS — E162 Hypoglycemia, unspecified: Secondary | ICD-10-CM | POA: Diagnosis not present

## 2019-08-27 DIAGNOSIS — D649 Anemia, unspecified: Secondary | ICD-10-CM | POA: Diagnosis not present

## 2019-08-27 DIAGNOSIS — D122 Benign neoplasm of ascending colon: Secondary | ICD-10-CM | POA: Diagnosis not present

## 2019-08-27 DIAGNOSIS — N2581 Secondary hyperparathyroidism of renal origin: Secondary | ICD-10-CM | POA: Diagnosis present

## 2019-08-27 DIAGNOSIS — R188 Other ascites: Secondary | ICD-10-CM | POA: Diagnosis not present

## 2019-08-27 DIAGNOSIS — I959 Hypotension, unspecified: Secondary | ICD-10-CM | POA: Diagnosis not present

## 2019-08-27 DIAGNOSIS — T80818A Extravasation of other vesicant agent, initial encounter: Secondary | ICD-10-CM | POA: Diagnosis present

## 2019-08-27 DIAGNOSIS — Z87891 Personal history of nicotine dependence: Secondary | ICD-10-CM

## 2019-08-27 DIAGNOSIS — I132 Hypertensive heart and chronic kidney disease with heart failure and with stage 5 chronic kidney disease, or end stage renal disease: Secondary | ICD-10-CM | POA: Diagnosis present

## 2019-08-27 DIAGNOSIS — Z833 Family history of diabetes mellitus: Secondary | ICD-10-CM

## 2019-08-27 DIAGNOSIS — Z992 Dependence on renal dialysis: Secondary | ICD-10-CM

## 2019-08-27 DIAGNOSIS — R011 Cardiac murmur, unspecified: Secondary | ICD-10-CM | POA: Diagnosis present

## 2019-08-27 DIAGNOSIS — K219 Gastro-esophageal reflux disease without esophagitis: Secondary | ICD-10-CM | POA: Diagnosis present

## 2019-08-27 DIAGNOSIS — Y841 Kidney dialysis as the cause of abnormal reaction of the patient, or of later complication, without mention of misadventure at the time of the procedure: Secondary | ICD-10-CM | POA: Diagnosis present

## 2019-08-27 DIAGNOSIS — Z7984 Long term (current) use of oral hypoglycemic drugs: Secondary | ICD-10-CM

## 2019-08-27 DIAGNOSIS — R443 Hallucinations, unspecified: Secondary | ICD-10-CM | POA: Diagnosis present

## 2019-08-27 DIAGNOSIS — Z955 Presence of coronary angioplasty implant and graft: Secondary | ICD-10-CM

## 2019-08-27 DIAGNOSIS — R4781 Slurred speech: Secondary | ICD-10-CM | POA: Diagnosis present

## 2019-08-27 DIAGNOSIS — E43 Unspecified severe protein-calorie malnutrition: Secondary | ICD-10-CM | POA: Diagnosis present

## 2019-08-27 DIAGNOSIS — K626 Ulcer of anus and rectum: Secondary | ICD-10-CM

## 2019-08-27 DIAGNOSIS — Z881 Allergy status to other antibiotic agents status: Secondary | ICD-10-CM

## 2019-08-27 DIAGNOSIS — R6510 Systemic inflammatory response syndrome (SIRS) of non-infectious origin without acute organ dysfunction: Secondary | ICD-10-CM | POA: Diagnosis not present

## 2019-08-27 DIAGNOSIS — I6381 Other cerebral infarction due to occlusion or stenosis of small artery: Secondary | ICD-10-CM | POA: Diagnosis not present

## 2019-08-27 DIAGNOSIS — D689 Coagulation defect, unspecified: Secondary | ICD-10-CM

## 2019-08-27 DIAGNOSIS — E8889 Other specified metabolic disorders: Secondary | ICD-10-CM | POA: Diagnosis present

## 2019-08-27 DIAGNOSIS — I2510 Atherosclerotic heart disease of native coronary artery without angina pectoris: Secondary | ICD-10-CM | POA: Diagnosis present

## 2019-08-27 DIAGNOSIS — Z66 Do not resuscitate: Secondary | ICD-10-CM | POA: Diagnosis present

## 2019-08-27 DIAGNOSIS — Z6839 Body mass index (BMI) 39.0-39.9, adult: Secondary | ICD-10-CM

## 2019-08-27 DIAGNOSIS — K5731 Diverticulosis of large intestine without perforation or abscess with bleeding: Secondary | ICD-10-CM | POA: Diagnosis not present

## 2019-08-27 DIAGNOSIS — Z7189 Other specified counseling: Secondary | ICD-10-CM

## 2019-08-27 DIAGNOSIS — N186 End stage renal disease: Secondary | ICD-10-CM

## 2019-08-27 DIAGNOSIS — Z20822 Contact with and (suspected) exposure to covid-19: Secondary | ICD-10-CM | POA: Diagnosis present

## 2019-08-27 DIAGNOSIS — D62 Acute posthemorrhagic anemia: Secondary | ICD-10-CM | POA: Diagnosis present

## 2019-08-27 DIAGNOSIS — R58 Hemorrhage, not elsewhere classified: Secondary | ICD-10-CM

## 2019-08-27 DIAGNOSIS — R410 Disorientation, unspecified: Secondary | ICD-10-CM | POA: Diagnosis not present

## 2019-08-27 DIAGNOSIS — G4489 Other headache syndrome: Secondary | ICD-10-CM | POA: Diagnosis present

## 2019-08-27 DIAGNOSIS — T82590A Other mechanical complication of surgically created arteriovenous fistula, initial encounter: Secondary | ICD-10-CM

## 2019-08-27 DIAGNOSIS — K5791 Diverticulosis of intestine, part unspecified, without perforation or abscess with bleeding: Secondary | ICD-10-CM | POA: Diagnosis not present

## 2019-08-27 DIAGNOSIS — T82838A Hemorrhage of vascular prosthetic devices, implants and grafts, initial encounter: Secondary | ICD-10-CM | POA: Diagnosis present

## 2019-08-27 DIAGNOSIS — I639 Cerebral infarction, unspecified: Secondary | ICD-10-CM | POA: Diagnosis not present

## 2019-08-27 DIAGNOSIS — Z9049 Acquired absence of other specified parts of digestive tract: Secondary | ICD-10-CM

## 2019-08-27 DIAGNOSIS — R269 Unspecified abnormalities of gait and mobility: Secondary | ICD-10-CM | POA: Diagnosis present

## 2019-08-27 DIAGNOSIS — Z7982 Long term (current) use of aspirin: Secondary | ICD-10-CM

## 2019-08-27 DIAGNOSIS — Z8719 Personal history of other diseases of the digestive system: Secondary | ICD-10-CM

## 2019-08-27 DIAGNOSIS — I6389 Other cerebral infarction: Secondary | ICD-10-CM | POA: Diagnosis not present

## 2019-08-27 DIAGNOSIS — K317 Polyp of stomach and duodenum: Secondary | ICD-10-CM | POA: Diagnosis present

## 2019-08-27 DIAGNOSIS — I350 Nonrheumatic aortic (valve) stenosis: Secondary | ICD-10-CM | POA: Diagnosis present

## 2019-08-27 DIAGNOSIS — H9190 Unspecified hearing loss, unspecified ear: Secondary | ICD-10-CM | POA: Diagnosis present

## 2019-08-27 DIAGNOSIS — K922 Gastrointestinal hemorrhage, unspecified: Secondary | ICD-10-CM | POA: Diagnosis not present

## 2019-08-27 DIAGNOSIS — Z888 Allergy status to other drugs, medicaments and biological substances status: Secondary | ICD-10-CM

## 2019-08-27 DIAGNOSIS — K921 Melena: Secondary | ICD-10-CM | POA: Diagnosis present

## 2019-08-27 DIAGNOSIS — I351 Nonrheumatic aortic (valve) insufficiency: Secondary | ICD-10-CM | POA: Diagnosis present

## 2019-08-27 DIAGNOSIS — R404 Transient alteration of awareness: Secondary | ICD-10-CM

## 2019-08-27 DIAGNOSIS — Z79899 Other long term (current) drug therapy: Secondary | ICD-10-CM

## 2019-08-27 DIAGNOSIS — F0280 Dementia in other diseases classified elsewhere without behavioral disturbance: Secondary | ICD-10-CM | POA: Diagnosis present

## 2019-08-27 DIAGNOSIS — I252 Old myocardial infarction: Secondary | ICD-10-CM

## 2019-08-27 DIAGNOSIS — Z809 Family history of malignant neoplasm, unspecified: Secondary | ICD-10-CM

## 2019-08-27 DIAGNOSIS — Z886 Allergy status to analgesic agent status: Secondary | ICD-10-CM

## 2019-08-27 DIAGNOSIS — F329 Major depressive disorder, single episode, unspecified: Secondary | ICD-10-CM | POA: Diagnosis present

## 2019-08-27 DIAGNOSIS — E1122 Type 2 diabetes mellitus with diabetic chronic kidney disease: Secondary | ICD-10-CM | POA: Diagnosis present

## 2019-08-27 DIAGNOSIS — Z95828 Presence of other vascular implants and grafts: Secondary | ICD-10-CM

## 2019-08-27 DIAGNOSIS — D631 Anemia in chronic kidney disease: Secondary | ICD-10-CM | POA: Diagnosis present

## 2019-08-27 DIAGNOSIS — E785 Hyperlipidemia, unspecified: Secondary | ICD-10-CM | POA: Diagnosis present

## 2019-08-27 DIAGNOSIS — Z79891 Long term (current) use of opiate analgesic: Secondary | ICD-10-CM

## 2019-08-27 DIAGNOSIS — K660 Peritoneal adhesions (postprocedural) (postinfection): Secondary | ICD-10-CM | POA: Diagnosis present

## 2019-08-27 DIAGNOSIS — G20 Parkinson's disease: Secondary | ICD-10-CM | POA: Diagnosis present

## 2019-08-27 DIAGNOSIS — D696 Thrombocytopenia, unspecified: Secondary | ICD-10-CM | POA: Diagnosis present

## 2019-08-27 DIAGNOSIS — Z882 Allergy status to sulfonamides status: Secondary | ICD-10-CM

## 2019-08-27 DIAGNOSIS — Z811 Family history of alcohol abuse and dependence: Secondary | ICD-10-CM

## 2019-08-27 DIAGNOSIS — Z79890 Hormone replacement therapy (postmenopausal): Secondary | ICD-10-CM

## 2019-08-27 DIAGNOSIS — G8929 Other chronic pain: Secondary | ICD-10-CM | POA: Diagnosis present

## 2019-08-27 DIAGNOSIS — Z96611 Presence of right artificial shoulder joint: Secondary | ICD-10-CM | POA: Diagnosis present

## 2019-08-27 DIAGNOSIS — Z8673 Personal history of transient ischemic attack (TIA), and cerebral infarction without residual deficits: Secondary | ICD-10-CM

## 2019-08-27 DIAGNOSIS — Z880 Allergy status to penicillin: Secondary | ICD-10-CM

## 2019-08-27 DIAGNOSIS — E039 Hypothyroidism, unspecified: Secondary | ICD-10-CM | POA: Diagnosis present

## 2019-08-27 DIAGNOSIS — M47812 Spondylosis without myelopathy or radiculopathy, cervical region: Secondary | ICD-10-CM | POA: Diagnosis present

## 2019-08-27 DIAGNOSIS — M546 Pain in thoracic spine: Secondary | ICD-10-CM | POA: Diagnosis present

## 2019-08-27 DIAGNOSIS — Z8616 Personal history of COVID-19: Secondary | ICD-10-CM

## 2019-08-27 DIAGNOSIS — F419 Anxiety disorder, unspecified: Secondary | ICD-10-CM | POA: Diagnosis present

## 2019-08-27 DIAGNOSIS — M7989 Other specified soft tissue disorders: Secondary | ICD-10-CM | POA: Diagnosis present

## 2019-08-27 DIAGNOSIS — Z820 Family history of epilepsy and other diseases of the nervous system: Secondary | ICD-10-CM

## 2019-08-27 LAB — RESPIRATORY PANEL BY RT PCR (FLU A&B, COVID)
Influenza A by PCR: NEGATIVE
Influenza B by PCR: NEGATIVE
SARS Coronavirus 2 by RT PCR: NEGATIVE

## 2019-08-27 LAB — I-STAT CHEM 8, ED
BUN: 24 mg/dL — ABNORMAL HIGH (ref 8–23)
Calcium, Ion: 0.93 mmol/L — ABNORMAL LOW (ref 1.15–1.40)
Chloride: 101 mmol/L (ref 98–111)
Creatinine, Ser: 5.8 mg/dL — ABNORMAL HIGH (ref 0.44–1.00)
Glucose, Bld: 95 mg/dL (ref 70–99)
HCT: 16 % — ABNORMAL LOW (ref 36.0–46.0)
Hemoglobin: 5.4 g/dL — CL (ref 12.0–15.0)
Potassium: 3.5 mmol/L (ref 3.5–5.1)
Sodium: 139 mmol/L (ref 135–145)
TCO2: 29 mmol/L (ref 22–32)

## 2019-08-27 LAB — COMPREHENSIVE METABOLIC PANEL
ALT: 17 U/L (ref 0–44)
AST: 36 U/L (ref 15–41)
Albumin: 1.5 g/dL — ABNORMAL LOW (ref 3.5–5.0)
Alkaline Phosphatase: 50 U/L (ref 38–126)
Anion gap: 13 (ref 5–15)
BUN: 20 mg/dL (ref 8–23)
CO2: 24 mmol/L (ref 22–32)
Calcium: 7 mg/dL — ABNORMAL LOW (ref 8.9–10.3)
Chloride: 103 mmol/L (ref 98–111)
Creatinine, Ser: 5.43 mg/dL — ABNORMAL HIGH (ref 0.44–1.00)
GFR calc Af Amer: 9 mL/min — ABNORMAL LOW (ref >60)
GFR calc non Af Amer: 7 mL/min — ABNORMAL LOW (ref >60)
Glucose, Bld: 107 mg/dL — ABNORMAL HIGH (ref 70–99)
Potassium: 3.6 mmol/L (ref 3.5–5.1)
Sodium: 140 mmol/L (ref 135–145)
Total Bilirubin: 1.4 mg/dL — ABNORMAL HIGH (ref 0.3–1.2)
Total Protein: 4.3 g/dL — ABNORMAL LOW (ref 6.5–8.1)

## 2019-08-27 LAB — CBC WITH DIFFERENTIAL/PLATELET
Abs Immature Granulocytes: 0.02 10*3/uL (ref 0.00–0.07)
Basophils Absolute: 0 10*3/uL (ref 0.0–0.1)
Basophils Relative: 0 %
Eosinophils Absolute: 0.1 10*3/uL (ref 0.0–0.5)
Eosinophils Relative: 2 %
HCT: 16 % — ABNORMAL LOW (ref 36.0–46.0)
Hemoglobin: 4.9 g/dL — CL (ref 12.0–15.0)
Immature Granulocytes: 0 %
Lymphocytes Relative: 20 %
Lymphs Abs: 1.1 10*3/uL (ref 0.7–4.0)
MCH: 38.9 pg — ABNORMAL HIGH (ref 26.0–34.0)
MCHC: 30.6 g/dL (ref 30.0–36.0)
MCV: 127 fL — ABNORMAL HIGH (ref 80.0–100.0)
Monocytes Absolute: 0.5 10*3/uL (ref 0.1–1.0)
Monocytes Relative: 10 %
Neutro Abs: 3.4 10*3/uL (ref 1.7–7.7)
Neutrophils Relative %: 68 %
Platelets: 151 10*3/uL (ref 150–400)
RBC: 1.26 MIL/uL — ABNORMAL LOW (ref 3.87–5.11)
RDW: 18.6 % — ABNORMAL HIGH (ref 11.5–15.5)
WBC: 5.2 10*3/uL (ref 4.0–10.5)
nRBC: 0 % (ref 0.0–0.2)

## 2019-08-27 LAB — MRSA PCR SCREENING: MRSA by PCR: NEGATIVE

## 2019-08-27 LAB — LACTIC ACID, PLASMA: Lactic Acid, Venous: 1.8 mmol/L (ref 0.5–1.9)

## 2019-08-27 LAB — GLUCOSE, CAPILLARY
Glucose-Capillary: 85 mg/dL (ref 70–99)
Glucose-Capillary: 88 mg/dL (ref 70–99)

## 2019-08-27 LAB — CBG MONITORING, ED: Glucose-Capillary: 97 mg/dL (ref 70–99)

## 2019-08-27 LAB — POC OCCULT BLOOD, ED: Fecal Occult Bld: POSITIVE — AB

## 2019-08-27 LAB — TROPONIN I (HIGH SENSITIVITY)
Troponin I (High Sensitivity): 24 ng/L — ABNORMAL HIGH (ref <18)
Troponin I (High Sensitivity): 27 ng/L — ABNORMAL HIGH (ref <18)

## 2019-08-27 LAB — HEMOGLOBIN A1C
Hgb A1c MFr Bld: 4.9 % (ref 4.8–5.6)
Mean Plasma Glucose: 93.93 mg/dL

## 2019-08-27 LAB — HIV ANTIBODY (ROUTINE TESTING W REFLEX): HIV Screen 4th Generation wRfx: NONREACTIVE

## 2019-08-27 LAB — PREPARE RBC (CROSSMATCH)

## 2019-08-27 LAB — PROTIME-INR
INR: 2.1 — ABNORMAL HIGH (ref 0.8–1.2)
Prothrombin Time: 23.6 seconds — ABNORMAL HIGH (ref 11.4–15.2)

## 2019-08-27 MED ORDER — INSULIN ASPART 100 UNIT/ML ~~LOC~~ SOLN: 0.0000 [IU] | SUBCUTANEOUS | Status: DC

## 2019-08-27 MED ORDER — ACETAMINOPHEN 325 MG PO TABS
650.0000 mg | ORAL_TABLET | ORAL | Status: DC | PRN
  Administered 2019-08-30 – 2019-09-04 (×4): 650 mg via ORAL
  Filled 2019-08-27 (×6): qty 2

## 2019-08-27 MED ORDER — PHYTONADIONE 5 MG PO TABS
10.0000 mg | ORAL_TABLET | Freq: Every day | ORAL | Status: DC
  Administered 2019-08-28 – 2019-09-06 (×8): 10 mg via ORAL
  Filled 2019-08-27 (×16): qty 2

## 2019-08-27 MED ORDER — SODIUM CHLORIDE 0.9 % IV SOLN
80.0000 mg | Freq: Once | INTRAVENOUS | Status: AC
  Administered 2019-08-27: 80 mg via INTRAVENOUS
  Filled 2019-08-27: qty 80

## 2019-08-27 MED ORDER — SODIUM CHLORIDE 0.9 % IV SOLN
8.0000 mg/h | INTRAVENOUS | Status: DC
  Administered 2019-08-27 – 2019-08-28 (×2): 8 mg/h via INTRAVENOUS
  Filled 2019-08-27 (×4): qty 80

## 2019-08-27 MED ORDER — PANTOPRAZOLE SODIUM 40 MG IV SOLR: 40.0000 mg | Freq: Two times a day (BID) | INTRAVENOUS | Status: DC

## 2019-08-27 MED ORDER — CALCITRIOL 0.5 MCG PO CAPS
1.0000 ug | ORAL_CAPSULE | ORAL | Status: DC
  Administered 2019-08-28 – 2019-09-06 (×5): 1 ug via ORAL
  Filled 2019-08-27 (×3): qty 2
  Filled 2019-08-27: qty 4
  Filled 2019-08-27: qty 2

## 2019-08-27 MED ORDER — ONDANSETRON HCL 4 MG/2ML IJ SOLN
4.0000 mg | Freq: Four times a day (QID) | INTRAMUSCULAR | Status: DC | PRN
  Administered 2019-09-05: 19:00:00 4 mg via INTRAVENOUS
  Filled 2019-08-27: qty 2

## 2019-08-27 MED ORDER — SODIUM CHLORIDE 0.9 % IV SOLN
10.0000 mL/h | Freq: Once | INTRAVENOUS | Status: AC
  Administered 2019-08-27: 10 mL/h via INTRAVENOUS

## 2019-08-27 MED ORDER — CHLORHEXIDINE GLUCONATE CLOTH 2 % EX PADS
6.0000 | MEDICATED_PAD | Freq: Every day | CUTANEOUS | Status: DC
  Administered 2019-08-28 – 2019-08-29 (×2): 6 via TOPICAL

## 2019-08-27 NOTE — ED Triage Notes (Addendum)
Pt here from home for black stools with red streaks, weakness, pale, abdominal pain, and hypotension 90/50. EMS gave 650 ml NS, last BP 116/50. AOx4 VSS. Pt gets dialysis MWF, last session was Friday. Pt had covid 07/15/2019.

## 2019-08-27 NOTE — H&P (Signed)
NAME:  Roberta Bryant, MRN:  [id number], DOB:  March 21, 1951, LOS: 0 ADMISSION DATE:  08/09/2019, CONSULTATION DATE:  3/21 REFERRING MD: Dr. Francia Greaves, CHIEF COMPLAINT: Dark stools, AMS  Brief History   69 y/o female admitted with ABLA in the setting of GIB (additional recent bleeding from AVF).  DNR/DNI.   History of present illness   69 y/o F who presented to [facility] on 3/21 with reports of dark stools and altered mental status.    The patient was seen on 3/17 in the ER after she was noted to be altered staring off and not talking during HD. She reported feeling generalized weakness and missed HD on 3/15.  Only completed 50 minutes of HD on 3/17. Her Hgb at that time was 8.8. She reports she had bleeding from her fistula on Friday 3/19 that was brisk and difficult to control.   She returns to the ER on 3/21 with reports of black-red stools, weakness, abdominal pain.  EMS found her to have a BP of 90/50 and treated her with 673ml NS.  BP improved to 116/50.  She was awake/alert on arrival with SBP's remaining in the 90's.  Her last HD session was 3/19.  The patient's initial labs concerning for worsening anemia with Hgb of 5.4 (iSTAT).  She was treated with 2 units blood in the ER.  CXR was notable for bibasilar atelectasis and low lung volumes but no acute process.  She was FOBT positive on presentation with subsequent melanic stools in the ER.  GI, Nephrology consulted.    PCCM consulted for admission.    Past Medical History  COVID - positive in July 15, 2019, hospitalized x2 ESRD - secondary to DM, on HD MWF at [facility], started in 2011 HTN Hypothyroidism  CHF  Parathyroidectomy  CAD  Parkinson's Disease  GERD IBS   Significant Hospital Events   3/21 Admit   Consults:  Nephrology  GI  PCCM   Procedures:    Significant Diagnostic Tests:  EGD 3/22 >>   Micro Data:  COVID 3/21 (hx of COVID 07/15/19) >>  Influenza A/B 3/21 >>   Antimicrobials:     Interim  history/subjective:     Objective   Blood pressure (!) 101/51, pulse 72, temperature 98.1 F (36.7 C), temperature source Oral, resp. rate 13, SpO2 100 %.       No intake or output data in the 24 hours ending 08/26/2019 1634 There were no vitals filed for this visit.  Examination: General: pale, ill appearing elderly female lying in bed in NAD   HEENT: MM pink/dry, mild icterus, pupils reactive  Neuro: AAOx4, speech clear, MAE CV: s1s2 RRR, no m/r/g PULM:  Non-labored on RA, clear anterior, diminished bases  GI: soft, bsx4 active, non-tender to palpation  Extremities: warm/dry, no edema, LUE AVF with +thrill / bruit   Skin: no rashes or lesions  Resolved Hospital Problem list     Assessment & Plan:   GIB  Suspect upper with melena. -appreciate GI assistance, plan for EGD in am 3/21  -continue PPI  -NPO for procedure in am  -IV team consult for second IV  Acute Blood Loss Anemia  Coagulopathy Received 2 units PRBC in ER on 3/21.  -follow CBC Q6, starting 2200  -volume resuscitation with PRBC -transfuse for Hgb <7% -SCD's only, no anticoagulation  -vitamin K per GI   ESRD on HD  Followed in [place], MWF -Appreciate Nephrology assistance with patient care -follow electrolytes, replace as indicated  DM II  -SSI, very sensitive scale while NPO  -hold home januvia  Hypothyroidism  -hold PO 3/21  -resume home synthroid post EGD  Hx HTN, HLD, CAD, Murmur   -Tele monitoring  -hold home coreg, fenofibrate, zetia, ASA  Anxiety / Depression  Chronic Pain  -hold home klonopin (0.5 mg QHS), PRN low dose ativan IV if needed  Moderate to Severe Malnutrition  -consider nutrition consult once PO  Best practice:  Diet: NPO for EGD Pain/Anxiety/Delirium protocol (if indicated): n/a VAP protocol (if indicated): n/a  DVT prophylaxis: SCD's  GI prophylaxis: PPI  Glucose control: SSI  Mobility: BR  Code Status: DNR / DNI, confirmed with family per Dr. Francia Greaves  Family  Communication: Patient updated on plan of care.  Family updated by EDP Disposition: ICU.    Labs   CBC: Recent Labs  Lab 08/23/19 1436 08/23/19 1713 08/20/2019 1247 08/22/2019 1321  WBC 5.0  --  5.2  --   NEUTROABS  --   --  PENDING  --   HGB 7.8* 8.8* PENDING 5.4*  HCT 25.2* 26.0* PENDING 16.0*  MCV 130.6*  --  PENDING  --   PLT 182  --  151  --     Basic Metabolic Panel: Recent Labs  Lab 08/23/19 1436 08/23/19 1713 09/03/2019 1247 08/08/2019 1321  NA 134* 136 140 139  K 3.4* 3.3* 3.6 3.5  CL 98  --  103 101  CO2 22  --  24  --   GLUCOSE 113*  --  107* 95  BUN 18  --  20 24*  CREATININE 6.63*  --  5.43* 5.80*  CALCIUM 8.2*  --  7.0*  --    GFR: CrCl cannot be calculated (Unknown ideal weight.). Recent Labs  Lab 08/23/19 1436 08/24/2019 1247  WBC 5.0 5.2  LATICACIDVEN  --  1.8    Liver Function Tests: Recent Labs  Lab 09/01/2019 1247  AST 36  ALT 17  ALKPHOS 50  BILITOT 1.4*  PROT 4.3*  ALBUMIN 1.5*   No results for input(s): LIPASE, AMYLASE in the last 168 hours. No results for input(s): AMMONIA in the last 168 hours.  ABG    Component Value Date/Time   HCO3 30.2 (H) 08/23/2019 1713   TCO2 29 08/24/2019 1321   O2SAT 43.0 08/23/2019 1713     Coagulation Profile: Recent Labs  Lab 08/15/2019 1247  INR 2.1*    Cardiac Enzymes: No results for input(s): CKTOTAL, CKMB, CKMBINDEX, TROPONINI in the last 168 hours.  HbA1C: Hgb A1c MFr Bld  Date/Time Value Ref Range Status  12/14/2017 01:04 PM 4.5 (L) 4.8 - 5.6 % Final    Comment:    (NOTE)         Prediabetes: 5.7 - 6.4         Diabetes: >6.4         Glycemic control for adults with diabetes: <7.0   06/18/2017 10:07 AM 5.2 4.8 - 5.6 % Final    Comment:    (NOTE) Pre diabetes:          5.7%-6.4% Diabetes:              >6.4% Glycemic control for   <7.0% adults with diabetes     CBG: Recent Labs  Lab 08/23/2019 1244  GLUCAP 97    Review of Systems: Positives in [place]   Gen: Denies fever,  chills, weight change, fatigue, night sweats HEENT: Denies blurred vision, double vision, hearing loss, tinnitus, sinus  congestion, rhinorrhea, sore throat, neck stiffness, dysphagia PULM: Denies shortness of breath, cough, sputum production, hemoptysis, wheezing CV: Denies chest pain, edema, orthopnea, paroxysmal nocturnal dyspnea, palpitations GI: Denies diffuse abdominal discomfort, nausea, vomiting, diarrhea, hematochezia, melena, constipation, change in bowel habits GU: Denies dysuria, hematuria, polyuria, oliguria, urethral discharge Endocrine: Denies hot or cold intolerance, polyuria, polyphagia or appetite change Derm: Denies rash, dry skin, scaling or peeling skin change Heme: Denies easy bruising, bleeding, bleeding gums Neuro: Denies headache, numbness, weakness, slurred speech, loss of memory or consciousness  Past Medical History  She,  has a past medical history of Anemia, Aortic stenosis, CHF (congestive heart failure) ([place]), Childhood asthma, Chronic upper back pain, Coronary artery disease, Decreased hearing, ESRD (end stage renal disease) on dialysis [facility]), Gait abnormality (08/28/2016), GERD (gastroesophageal reflux disease), Headache, Headache syndrome (01/08/2017), Heart murmur, History of blood transfusion (1996), Hyperlipidemia, Hypertension, Hypothyroidism, IBS (irritable bowel syndrome), Myocardial infarction ([place]) (2004), Osteoarthritis, Pneumonia, Rotator cuff arthropathy of right shoulder, Spondylosis 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   Surgical History    Past Surgical History:  Procedure Laterality Date  . A/V FISTULAGRAM Left 05/31/2018   Procedure: A/V FISTULAGRAM;  Surgeon: Serafina Mitchell, MD;  Location: [place] [facility];  Service: Cardiovascular;  Laterality: Left;  . APPENDECTOMY    . AV FISTULA PLACEMENT Left 12/03/2006   "forearm"  . AV FISTULA REPAIR Left 11/25/2009   "forearm"  . CARPAL TUNNEL  RELEASE Right 02/11/2017   Procedure: RIGHT CARPAL TUNNEL RELEASE;  Surgeon: Daryll Brod, MD;  Location: [place];  Service: Orthopedics;  Laterality: Right;  . CHOLECYSTECTOMY OPEN  1970's  . COLONOSCOPY W/ POLYPECTOMY  07/30/2010   Minimal sigmoid diverticulosis. Small internal hemorrhoids. Otherwise normal colonoscopy to TI.   Marland Kitchen CORONARY ANGIOPLASTY    . CORONARY ANGIOPLASTY WITH STENT PLACEMENT    . ESOPHAGOGASTRODUODENOSCOPY  10/21/2012   Gastric polyps- status post polypectomy. Mild gastritis.   . INSERTION OF DIALYSIS CATHETER Right 2010   "chest"  . PARATHYROIDECTOMY  2010?   Parathyroid autotransplantation   . REVERSE SHOULDER ARTHROPLASTY Right 01/06/2018  . REVERSE SHOULDER ARTHROPLASTY Right 01/06/2018   Procedure: RIGHT REVERSE SHOULDER ARTHROPLASTY;  Surgeon: [name], MD;  Location: [place];  Service: Orthopedics;  Laterality: Right;  . REVISON OF ARTERIOVENOUS FISTULA Left 12/04/2013   Procedure: REVISON OF ARTERIOVENOUS FISTULA;  Surgeon: Rosetta Posner, MD;  Location: [place];  Service: Vascular;  Laterality: Left;  . REVISON OF ARTERIOVENOUS FISTULA Left 02/11/931   Procedure: PLICATION OF LEFT BRACHIOCEPHALIC  ARTERIOVENOUS FISTULA;  Surgeon: Conrad Hennessey, MD;  Location: [place];  Service: Vascular;  Laterality: Left;  . SHOULDER ARTHROSCOPY WITH ROTATOR CUFF REPAIR AND SUBACROMIAL DECOMPRESSION Right 06/22/2017   Procedure: RIGHT SHOULDER ARTHROSCOPY WITH EXTENSIVE DEBRIDEMENT;  Surgeon: Mcarthur Rossetti, MD;  Location: [place];  Service: Orthopedics;  Laterality: Right;  . TRIGGER FINGER RELEASE Right 10/22/2017   Procedure: RELEASE RIGHT INDEX TRIGGER FINGER/A-1 PULLEY;  Surgeon: Daryll Brod, MD;  Location: [place];  Service: Orthopedics;  Laterality: Right;  . TUBAL LIGATION  1989  . UMBILICAL HERNIA REPAIR  1990's X 2     Social History   reports that she has quit smoking. Her smoking use included cigarettes. She quit after 3.00 years of use. She  has never used smokeless tobacco. She reports previous drug use. Drugs: Marijuana and Hydromorphone. She reports that she does not drink alcohol.   Family History  Her family history includes Alcohol abuse in her father; Cancer in her mother; Diabetes in her mother; Epilepsy in her mother.   Allergies Allergies  Allergen Reactions  . Sulfa Antibiotics Anaphylaxis  . Amoxicillin Hives    Has patient had a PCN reaction causing immediate rash, facial/tongue/throat swelling, SOB or lightheadedness with hypotension: No Has patient had a PCN reaction causing severe rash involving mucus membranes or skin necrosis: No Has patient had a PCN reaction that required hospitalization: No Has patient had a PCN reaction occurring within the last 10 years: Unknown If all of the above answers are "NO", then may proceed with Cephalosporin use.   Marland Kitchen Crestor [Rosuvastatin Calcium] Other (See Comments)    Dizziness, couldn't walk  . Ibuprofen Hives  . Naldecon Senior [employer]  . Statins Hives  . Chlorphen-Phenyleph-Asa Rash  . Gabapentin Other (See Comments)    Dizzy     Home Medications  Prior to Admission medications   Medication Sig Start Date End Date Taking? Authorizing Provider  acetaminophen (TYLENOL) 500 MG tablet Take 500-1,000 mg by mouth every 6 (six) hours as needed (FOR PAIN.).   Yes [provider]  aspirin EC 81 MG tablet Take 81 mg by mouth daily.    Yes [provider]  carvedilol (COREG) 6.25 MG tablet Take 6.25 mg by mouth 2 (two) times daily with a meal.   Yes [provider]  clonazePAM (KLONOPIN) 0.5 MG tablet TAKE 1 TABLET BY MOUTH AT BEDTIME Patient taking differently: Take 0.5 mg by mouth at bedtime.  05/15/19  Yes Kathrynn Ducking, MD  ezetimibe (ZETIA) 10 MG tablet Take 10 mg by mouth every evening.  05/10/16  Yes [provider]  fenofibrate 160 MG tablet Take 160 mg by mouth every evening.  03/08/17  Yes [provider]    folic acid-vitamin b complex-vitamin c-selenium-zinc (DIALYVITE) 3 MG TABS tablet Take 1 tablet by mouth daily.   Yes [provider]  Lanthanum Carbonate (FOSRENOL) 1000 MG PACK Take 2,000 mg by mouth 3 (three) times daily.    Yes [provider]  levothyroxine (SYNTHROID, LEVOTHROID) 50 MCG tablet Take 50 mcg by mouth daily before breakfast.    Yes [provider]  lidocaine-prilocaine (EMLA) cream Apply 1 application topically See admin instructions. Apply a small amount to access site 1-2 hours as needed before dialysis--cover with occlusive dressing 09/24/17  Yes [provider]  loperamide (IMODIUM A-D) 2 MG tablet Take 2-4 mg by mouth every Monday, Wednesday, and Friday with hemodialysis.    Yes [provider]  nortriptyline (PAMELOR) 10 MG capsule Take 2 capsules (20 mg total) by mouth at bedtime. 08/14/19  Yes Kathrynn Ducking, MD  omeprazole (PRILOSEC) 20 MG capsule Take 20 mg by mouth in the morning and at bedtime.   Yes [provider]  sitaGLIPtin (JANUVIA) 25 MG tablet Take 25 mg by mouth daily.   Yes [provider]  Vitamin D, Ergocalciferol, (DRISDOL) 1.25 MG (50000 UT) CAPS capsule Take 50,000 Units by mouth every 30 (thirty) days. 04/22/19  Yes [provider]  HYDROcodone-acetaminophen (NORCO/VICODIN) 5-325 MG tablet Take 1-2 tablets by mouth every 6 (six) hours as needed for severe pain. Patient not taking: Reported on 08/23/2019 03/06/19   Joy, Shawn C, PA-C  ondansetron (ZOFRAN ODT) 4 MG disintegrating tablet Take 1 tablet (4 mg total) by mouth every 8 (eight) hours as needed for nausea or vomiting. Patient not taking: Reported on 05/30/2019 05/18/19  Jackquline Denmark, MD  pantoprazole (PROTONIX) 40 MG tablet Take 1 tablet (40 mg total) by mouth 2 (two) times daily. Patient not taking: Reported on 08/23/2019 05/18/19   Jackquline Denmark, MD     Critical care time: 47 minutes    Noe Gens, MSN, NP-C [facility] 09/01/2019, 4:34 PM   Please see [email or web address] for pager details.

## 2019-08-27 NOTE — ED Provider Notes (Signed)
[facility] EMERGENCY DEPARTMENT Provider Note   CSN: [id number] Arrival date & time: 08/16/2019  1242     History Chief Complaint  Patient presents with  . GI Bleeding    Roberta Bryant is a 69 y.o. female.  69 year old female with prior medical history as detailed below presents for evaluation of suspected GI bleed.  Patient arrives from home via EMS.  Patient's son noted dark bloody stool this morning.  Patient is complaining of feeling weak.  It is unclear as to how long the patient has been having melanotic stool.  Patient denies nausea or vomiting.  She denies chest pain or shortness of breath.  Of note, patient is on dialysis and her last dialysis session was on Friday.  EMS reports that the patient's initial blood pressure was in the high 70s low 80s at home.  She received 600 mL bolus in route to the hospital.  Initial blood pressure in the ED was a systolic of 854. Immediate repeat BP was systolic of 62'V.  Patient reports that she is a DNR/DNI.  This information is confirmed through conversation with the patient's son [name] over the phone.  Dr. Lyndel Safe is the patient's prior GI provider.   The history is provided by the patient and medical records.  Illness Location:  Gi bleed Severity:  Moderate Onset quality:  Gradual Timing:  Rare Progression:  Unchanged Chronicity:  New      Past Medical History:  Diagnosis Date  . Anemia   . Aortic stenosis   . CHF (congestive heart failure) ([place])   . Childhood asthma   . Chronic upper back pain   . Coronary artery disease    Stent to left main coronary artery second of August 2017  . Decreased hearing   . ESRD (end stage renal disease) on dialysis [facility])    "MWF; [facility] in [place]" (01/06/2018)  . Gait abnormality 08/28/2016  . GERD (gastroesophageal reflux disease)   . Headache    "a few times/week" (10/30/2014)  . Headache syndrome 01/08/2017  . Heart murmur   . History of blood transfusion 1996    "related to menses"  . Hyperlipidemia   . Hypertension   . Hypothyroidism   . IBS (irritable bowel syndrome)   . Myocardial infarction ([place]) 2004  . Osteoarthritis   . Pneumonia    "years ago"  . Rotator cuff arthropathy of right shoulder   . Spondylosis of cervical spine 06/16/2017  . Stroke ([place]) 07/2016   mini stroke , right side of body weak-   . Type II diabetes mellitus [facility])     Patient Active Problem List   Diagnosis Date Noted  . CVA (cerebral vascular accident) ([place]) 05/30/2019  . Shoulder arthritis 01/06/2018  . Rotator cuff arthropathy of right shoulder   . Status post arthroscopy of right shoulder 06/22/2017  . Spondylosis of cervical spine 06/16/2017  . Trigger index finger of right hand 06/11/2017  . Aortic stenosis 04/12/2017  . Coronary artery diseaseStatus post PTCA and stenting with drug-eluting stent to left main coronary artery in the summer 2017 04/12/2017  . 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 Decreased ROM of finger 03/01/2017  . Decreased ROM of wrist 03/01/2017  . Pain in finger of right hand 03/01/2017  . Headache syndrome 01/08/2017  . Right carpal tunnel syndrome 12/14/2016  . Complete tear of right rotator cuff 10/21/2016  . Gait abnormality 08/28/2016  . Nasal injury, initial encounter 07/01/2016  . Nasal septal deviation 07/01/2016  .  Nasal turbinate hypertrophy 07/01/2016  . Abnormal stress test 01/01/2016  . Chest pain 11/23/2015  . ESRD on dialysis ([place])   . Type 2 diabetes mellitus with complication ([place])   . Essential hypertension   . Dyslipidemia 12/24/2014  . Hemodialysis patient ([place]) 12/24/2014  . Abnormal ECG   . Pain in the chest   . Hypotension 10/30/2014  . Abnormal EKG 10/30/2014  . 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 Essential tremor 01/10/2014  . Parkinson's disease ([place]) 01/10/2014  . Migraine without aura 07/28/2013  . End stage renal disease ([place]) 07/18/2012  . Other complications due to renal  dialysis device, implant, and graft 07/18/2012    Past Surgical History:  Procedure Laterality Date  . A/V FISTULAGRAM Left 05/31/2018   Procedure: A/V FISTULAGRAM;  Surgeon: Serafina Mitchell, MD;  Location: [facility];  Service: Cardiovascular;  Laterality: Left;  . APPENDECTOMY    . AV FISTULA PLACEMENT Left 12/03/2006   "forearm"  . AV FISTULA REPAIR Left 11/25/2009   "forearm"  . CARPAL TUNNEL RELEASE Right 02/11/2017   Procedure: RIGHT CARPAL TUNNEL RELEASE;  Surgeon: Daryll Brod, MD;  Location: [place];  Service: Orthopedics;  Laterality: Right;  . CHOLECYSTECTOMY OPEN  1970's  . COLONOSCOPY W/ POLYPECTOMY  07/30/2010   Minimal sigmoid diverticulosis. Small internal hemorrhoids. Otherwise normal colonoscopy to TI.   Marland Kitchen CORONARY ANGIOPLASTY    . CORONARY ANGIOPLASTY WITH STENT PLACEMENT    . ESOPHAGOGASTRODUODENOSCOPY  10/21/2012   Gastric polyps- status post polypectomy. Mild gastritis.   . INSERTION OF DIALYSIS CATHETER Right 2010   "chest"  . PARATHYROIDECTOMY  2010?   Parathyroid autotransplantation   . REVERSE SHOULDER ARTHROPLASTY Right 01/06/2018  . REVERSE SHOULDER ARTHROPLASTY Right 01/06/2018   Procedure: RIGHT REVERSE SHOULDER ARTHROPLASTY;  Surgeon: [name], MD;  Location: [facility];  Service: Orthopedics;  Laterality: Right;  . REVISON OF ARTERIOVENOUS FISTULA Left 12/04/2013   Procedure: REVISON OF ARTERIOVENOUS FISTULA;  Surgeon: Rosetta Posner, MD;  Location: [facility];  Service: Vascular;  Laterality: Left;  . REVISON OF ARTERIOVENOUS FISTULA Left 06/16/3788   Procedure: PLICATION OF LEFT BRACHIOCEPHALIC  ARTERIOVENOUS FISTULA;  Surgeon: Conrad French [place], MD;  Location: [facility];  Service: Vascular;  Laterality: Left;  . SHOULDER ARTHROSCOPY WITH ROTATOR CUFF REPAIR AND SUBACROMIAL DECOMPRESSION Right 06/22/2017   Procedure: RIGHT SHOULDER ARTHROSCOPY WITH EXTENSIVE DEBRIDEMENT;  Surgeon: Mcarthur Rossetti, MD;  Location: [facility];  Service:  Orthopedics;  Laterality: Right;  . TRIGGER FINGER RELEASE Right 10/22/2017   Procedure: RELEASE RIGHT INDEX TRIGGER FINGER/A-1 PULLEY;  Surgeon: Daryll Brod, MD;  Location: [facility];  Service: Orthopedics;  Laterality: Right;  . TUBAL LIGATION  1989  . UMBILICAL HERNIA REPAIR  1990's X 2     OB History   No obstetric history on file.     Family History  Problem Relation Age of Onset  . Epilepsy Mother   . Diabetes Mother   . Cancer Mother   . Alcohol abuse Father     Social History   Tobacco Use  . Smoking status: Former Smoker    Years: 3.00    Types: Cigarettes  . Smokeless tobacco: Never Used  . Tobacco comment: "stopped smoking in the 1970's"  Substance Use Topics  . Alcohol use: No  . Drug use: Not Currently    Types: Marijuana, Hydromorphone    Comment: as a teenager    Home Medications Prior to Admission medications  Medication Sig Start Date End Date Taking? Authorizing Provider  acetaminophen (TYLENOL) 500 MG tablet Take 500-1,000 mg by mouth every 6 (six) hours as needed (FOR PAIN.).   Yes [provider]  aspirin EC 81 MG tablet Take 81 mg by mouth daily.    Yes [provider]  carvedilol (COREG) 6.25 MG tablet Take 6.25 mg by mouth 2 (two) times daily with a meal.   Yes [provider]  clonazePAM (KLONOPIN) 0.5 MG tablet TAKE 1 TABLET BY MOUTH AT BEDTIME Patient taking differently: Take 0.5 mg by mouth at bedtime.  05/15/19  Yes Kathrynn Ducking, MD  ezetimibe (ZETIA) 10 MG tablet Take 10 mg by mouth every evening.  05/10/16  Yes [provider]  fenofibrate 160 MG tablet Take 160 mg by mouth every evening.  03/08/17  Yes [provider]  folic acid-vitamin b complex-vitamin c-selenium-zinc (DIALYVITE) 3 MG TABS tablet Take 1 tablet by mouth daily.   Yes [provider]  levothyroxine (SYNTHROID, LEVOTHROID) 50 MCG tablet Take 50 mcg by mouth daily before breakfast.    Yes [provider]    nortriptyline (PAMELOR) 10 MG capsule Take 2 capsules (20 mg total) by mouth at bedtime. 08/14/19  Yes Kathrynn Ducking, MD  omeprazole (PRILOSEC) 20 MG capsule Take 20 mg by mouth in the morning and at bedtime.   Yes [provider]  sitaGLIPtin (JANUVIA) 25 MG tablet Take 25 mg by mouth daily.   Yes [provider]  Vitamin D, Ergocalciferol, (DRISDOL) 1.25 MG (50000 UT) CAPS capsule Take 50,000 Units by mouth every 30 (thirty) days. 04/22/19  Yes [provider]  HYDROcodone-acetaminophen (NORCO/VICODIN) 5-325 MG tablet Take 1-2 tablets by mouth every 6 (six) hours as needed for severe pain. Patient not taking: Reported on 08/23/2019 03/06/19   Joy, Helane Gunther, PA-C  Lanthanum Carbonate (FOSRENOL) 1000 MG PACK Take 2,000 mg by mouth 3 (three) times daily.     [provider]  lidocaine-prilocaine (EMLA) cream Apply 1 application topically See admin instructions. Apply a small amount to access site 1-2 hours before dialysis--cover with occlusive dressing 09/24/17   [provider]  loperamide (IMODIUM A-D) 2 MG tablet Take 2-4 mg by mouth every Monday, Wednesday, and Friday with hemodialysis.     [provider]  ondansetron (ZOFRAN ODT) 4 MG disintegrating tablet Take 1 tablet (4 mg total) by mouth every 8 (eight) hours as needed for nausea or vomiting. Patient not taking: Reported on 05/30/2019 05/18/19   Jackquline Denmark, MD  pantoprazole (PROTONIX) 40 MG tablet Take 1 tablet (40 mg total) by mouth 2 (two) times daily. Patient not taking: Reported on 08/23/2019 05/18/19   Jackquline Denmark, MD    Allergies    Sulfa antibiotics, Amoxicillin, Crestor [rosuvastatin calcium], Ibuprofen, Naldecon senior [guaifenesin], Statins, Chlorphen-phenyleph-asa, and Gabapentin  Review of Systems   Review of Systems  All other systems reviewed and are negative.   Physical Exam Updated Vital Signs BP (!) 92/46   Pulse 70   Temp 97.6 F (36.4 C) (Oral)   Resp  13   SpO2 100%   Physical Exam Vitals and nursing note reviewed.  Constitutional:      General: She is not in acute distress.    Appearance: She is well-developed.  HENT:     Head: Normocephalic and atraumatic.  Eyes:     Conjunctiva/sclera: Conjunctivae normal.     Pupils: Pupils are equal, round, and reactive to light.  Cardiovascular:  Rate and Rhythm: Normal rate and regular rhythm.     Heart sounds: Normal heart sounds.  Pulmonary:     Effort: Pulmonary effort is normal. No respiratory distress.     Breath sounds: Normal breath sounds.  Abdominal:     General: There is no distension.     Palpations: Abdomen is soft.     Tenderness: There is no abdominal tenderness.  Genitourinary:    Comments: Grossly bloody stool noted on the rectal exam.  Musculoskeletal:        General: No deformity. Normal range of motion.     Cervical back: Normal range of motion and neck supple.  Skin:    General: Skin is warm and dry.  Neurological:     Mental Status: She is alert and oriented to person, place, and time.     ED Results / Procedures / Treatments   Labs (all labs ordered are listed, but only abnormal results are displayed) Labs Reviewed  COMPREHENSIVE METABOLIC PANEL - Abnormal; Notable for the following components:      Result Value   Glucose, Bld 107 (*)    Creatinine, Ser 5.43 (*)    Calcium 7.0 (*)    Total Protein 4.3 (*)    Albumin 1.5 (*)    Total Bilirubin 1.4 (*)    GFR calc non Af Amer 7 (*)    GFR calc Af Amer 9 (*)    All other components within normal limits  CBC WITH DIFFERENTIAL/PLATELET - Abnormal; Notable for the following components:   RBC 1.26 (*)    Hemoglobin 4.9 (*)    HCT 16.0 (*)    MCV 127.0 (*)    MCH 38.9 (*)    RDW 18.6 (*)    All other components within normal limits  PROTIME-INR - Abnormal; Notable for the following components:   Prothrombin Time 23.6 (*)    INR 2.1 (*)    All other components within normal limits  I-STAT CHEM 8,  ED - Abnormal; Notable for the following components:   BUN 24 (*)    Creatinine, Ser 5.80 (*)    Calcium, Ion 0.93 (*)    Hemoglobin 5.4 (*)    HCT 16.0 (*)    All other components within normal limits  POC OCCULT BLOOD, ED - Abnormal; Notable for the following components:   Fecal Occult Bld POSITIVE (*)    All other components within normal limits  TROPONIN I (HIGH SENSITIVITY) - Abnormal; Notable for the following components:   Troponin I (High Sensitivity) 24 (*)    All other components within normal limits  RESPIRATORY PANEL BY RT PCR (FLU A&B, COVID)  LACTIC ACID, PLASMA  HEMOGLOBIN AND HEMATOCRIT, BLOOD  HEMOGLOBIN AND HEMATOCRIT, BLOOD  HEMOGLOBIN AND HEMATOCRIT, BLOOD  CBG MONITORING, ED  TYPE AND SCREEN  PREPARE RBC (CROSSMATCH)  TROPONIN I (HIGH SENSITIVITY)    EKG EKG Interpretation  Date/Time:  Sunday August 27 2019 13:02:48 EDT Ventricular Rate:  77 PR Interval:    QRS Duration: 84 QT Interval:  526 QTC Calculation: 596 R Axis:   34 Text Interpretation: Sinus rhythm Low voltage, extremity leads Nonspecific T abnormalities, lateral leads Prolonged QT interval Confirmed by Dene Gentry [phone]) on 09/05/2019 1:09:38 PM   Radiology DG Chest Port 1 View  Result Date: 08/20/2019 CLINICAL DATA:  Pt here from home for black stools with red streaks, weakness, pale, abdominal pain, and hypotension. Dialysis patient. EXAM: PORTABLE CHEST 1 VIEW COMPARISON:  Chest radiograph 08/09/2019 FINDINGS:  Stable cardiomediastinal contours. Low lung volumes. Scattered bibasilar opacities likely reflecting atelectasis. No new focal consolidation. No pneumothorax or significant pleural effusion. Status post right shoulder arthroplasty. IMPRESSION: Low lung volumes with bibasilar atelectasis. Electronically Signed   By: Audie Pinto M.D.   On: 08/13/2019 13:31    Procedures Procedures (including critical care time) CRITICAL CARE Performed by: Valarie Merino   Total critical  care time: 45 minutes  Critical care time was exclusive of separately billable procedures and treating other patients.  Critical care was necessary to treat or prevent imminent or life-threatening deterioration.  Critical care was time spent personally by me on the following activities: development of treatment plan with patient and/or surrogate as well as nursing, discussions with consultants, evaluation of patient's response to treatment, examination of patient, obtaining history from patient or surrogate, ordering and performing treatments and interventions, ordering and review of laboratory studies, ordering and review of radiographic studies, pulse oximetry and re-evaluation of patient's condition.   Medications Ordered in ED Medications  pantoprazole (PROTONIX) 80 mg in sodium chloride 0.9 % 100 mL IVPB (has no administration in time range)  0.9 %  sodium chloride infusion (has no administration in time range)    ED Course  I have reviewed the triage vital signs and the nursing notes.  Pertinent labs & imaging results that were available during my care of the patient were reviewed by me and considered in my medical decision making (see chart for details).    MDM Rules/Calculators/A&P                      MDM  Screen complete  DOREATHER HOXWORTH was evaluated in Emergency Department on 08/18/2019 for the symptoms described in the history of present illness. She was evaluated in the context of the global COVID-19 pandemic, which necessitated consideration that the patient might be at risk for infection with the SARS-CoV-2 virus that causes COVID-19. Institutional protocols and algorithms that pertain to the evaluation of patients at risk for COVID-19 are in a state of rapid change based on information released by regulatory bodies including the CDC and federal and state organizations. These policies and algorithms were followed during the patient's care in the ED.   Patient is  presenting with GI bleed.  Initial blood pressure is notably low - emergent release PRBC transfusion initiated in the ED. Patient is mentating well. She appears to be fluid responsive.   Labs confirm significant anemia. It is unclear as to exactly how long the patient may have been bleeding.   Patient is DNR/DNI.  Patient confirms her DNR/DNI status with direct questioning from this provider.  I also asked her son [name] about her CODE STATUS.  [name] confirms the patient is a DNR/DNI.    Patient consents to blood transfusion.   [facility] is aware of case and will follow.  Critical care is aware of case and will evaluate.    Final Clinical Impression(s) / ED Diagnoses Final diagnoses:  Lower GI bleed    Rx / DC Orders ED Discharge Orders    None       Valarie Merino, MD 08/07/2019 [id number]

## 2019-08-27 NOTE — H&P (View-Only) (Signed)
Referring Provider:  Dene Gentry, MD; Emergency Room         Primary Care Physician:  [name], MD Primary Gastroenterologist:  Carmell Austria, MD          Reason for Consultation:  Acute gi bleeding                 ASSESSMENT /  PLAN   69 y.o. 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  1.  GI bleeding --based on the characteristics of the stool, unclear source though I favor upper GI bleeding.  Hemodynamically she is stable despite being profoundly anemic with initial hemoglobin less than 5.0.  She needs to be resuscitated fully and then will need to have upper endoscopy.  She has been started on PPI.  We have 1 peripheral IV and the IV team will try a second IV.  If unsuccessful would ask critical care to obtain central access.  Would begin PPI infusion. --Resuscitation, continue blood transfusion, goal hemoglobin greater than 7 --PPI infusion --Maintain 2 peripheral, large-bore, IVs; central line if second IV unobtainable --N.p.o. for now --EGD when medically appropriate, will tentatively scheduled for 7:30 AM tomorrow with Dr. Tarri Glenn. --Hold aspirin --Vitamin K to correct INR  2.  Coagulopathy --INR is elevated at 2.1.  This may be due to malnutrition. --Vitamin K x3 days  3.  ESRD --on hemodialysis, usually Monday Wednesday Friday, nephrology will consult    HPI:     Roberta Bryant is a 69 y.o. female with end-stage renal disease on dialysis, CHF, hypertension, hyperlipidemia, hypothyroidism, diabetes, several hospitalizations in February and March 2021 for COVID-19 and related complication who presents with bloody stools.  She is known to Dr. Lyndel Safe from prior GI evaluation including colonoscopy in February 2012 which was negative.  EGD in May  2014 revealing hyperplastic gastric polyps, normal small bowel biopsies.  Dr. Lyndel Safe last saw her by televisit in December 2020 with intermittent nausea and vomiting complaint as well as alternating bowel habits.  At that time he changed omeprazole to Protonix 40 twice daily added Zofran for nausea.  They considered upper endoscopy after cardiac clearance if symptoms worsen.  She was to follow-up in about 12 weeks.  Patient stated that she started bleeding from her dialysis fistula on Friday.  She reports that the blood was brisk and difficult to control.  Eventually this did abate.  And then yesterday she developed dark bloody stools.  She reported feeling very weak and tired.  Diffuse abdominal discomfort at home.  Denies abdominal pain now.  Still feels weak.  Appetite has been very decreased but no nausea or vomiting.  She did complete dialysis session on Friday.  When EMS responded the patient had hypotension with systolic blood pressure around 80.  She received bolus of fluids on route to the ER.  Once here again mildly hypotensive and emergency release blood was given x1 unit.  She was never tachycardic.  Pressure is now are ranging from 97-353 systolic.  Heart rate 70-80.  Afebrile.  Unit #2 of red cells is being hung now by the nurse.  She takes aspirin but no other antiplatelet and no anticoagulants.   I-STAT hemoglobin was 5.4 though regular CBC not resulted the lab called with a hemoglobin of 4.7.  Platelets normal, white count 5.2, INR is  2.1    Past Medical History:  Diagnosis Date  . Anemia   . Aortic stenosis   . CHF (congestive heart failure) ([place])   . Childhood asthma   . Chronic upper back pain   . Coronary artery disease    Stent to left main coronary artery second of August 2017  . Decreased hearing   . ESRD (end stage renal disease) on dialysis [facility])    "MWF; [facility] in [place]" (01/06/2018)  . Gait abnormality 08/28/2016  . GERD (gastroesophageal reflux disease)   .  Headache    "a few times/week" (10/30/2014)  . Headache syndrome 01/08/2017  . Heart murmur   . History of blood transfusion 1996   "related to menses"  . Hyperlipidemia   . Hypertension   . Hypothyroidism   . IBS (irritable bowel syndrome)   . Myocardial infarction ([place]) 2004  . Osteoarthritis   . Pneumonia    "years ago"  . Rotator cuff arthropathy of right shoulder   . Spondylosis of cervical spine 06/16/2017  . Stroke ([place]) 07/2016   mini stroke , right side of body weak-   . Type II diabetes mellitus ([place])     Past Surgical History:  Procedure Laterality Date  . A/V FISTULAGRAM Left 05/31/2018   Procedure: A/V FISTULAGRAM;  Surgeon: Serafina Mitchell, MD;  Location: [place] [facility];  Service: Cardiovascular;  Laterality: Left;  . APPENDECTOMY    . AV FISTULA PLACEMENT Left 12/03/2006   "forearm"  . AV FISTULA REPAIR Left 11/25/2009   "forearm"  . CARPAL TUNNEL RELEASE Right 02/11/2017   Procedure: RIGHT CARPAL TUNNEL RELEASE;  Surgeon: Daryll Brod, MD;  Location: [place];  Service: Orthopedics;  Laterality: Right;  . CHOLECYSTECTOMY OPEN  1970's  . COLONOSCOPY W/ POLYPECTOMY  07/30/2010   Minimal sigmoid diverticulosis. Small internal hemorrhoids. Otherwise normal colonoscopy to TI.   Marland Kitchen CORONARY ANGIOPLASTY    . CORONARY ANGIOPLASTY WITH STENT PLACEMENT    . ESOPHAGOGASTRODUODENOSCOPY  10/21/2012   Gastric polyps- status post polypectomy. Mild gastritis.   . INSERTION OF DIALYSIS CATHETER Right 2010   "chest"  . PARATHYROIDECTOMY  2010?   Parathyroid autotransplantation   . REVERSE SHOULDER ARTHROPLASTY Right 01/06/2018  . REVERSE SHOULDER ARTHROPLASTY Right 01/06/2018   Procedure: RIGHT REVERSE SHOULDER ARTHROPLASTY;  Surgeon: [name], MD;  Location: [place];  Service: Orthopedics;  Laterality: Right;  . REVISON OF ARTERIOVENOUS FISTULA Left 12/04/2013   Procedure: REVISON OF ARTERIOVENOUS FISTULA;  Surgeon: Rosetta Posner, MD;  Location: [place];   Service: Vascular;  Laterality: Left;  . REVISON OF ARTERIOVENOUS FISTULA Left 09/12/8293   Procedure: PLICATION OF LEFT BRACHIOCEPHALIC  ARTERIOVENOUS FISTULA;  Surgeon: Conrad Del Muerto, MD;  Location: [place];  Service: Vascular;  Laterality: Left;  . SHOULDER ARTHROSCOPY WITH ROTATOR CUFF REPAIR AND SUBACROMIAL DECOMPRESSION Right 06/22/2017   Procedure: RIGHT SHOULDER ARTHROSCOPY WITH EXTENSIVE DEBRIDEMENT;  Surgeon: Mcarthur Rossetti, MD;  Location: [place];  Service: Orthopedics;  Laterality: Right;  . TRIGGER FINGER RELEASE Right 10/22/2017   Procedure: RELEASE RIGHT INDEX TRIGGER FINGER/A-1 PULLEY;  Surgeon: Daryll Brod, MD;  Location: [place];  Service: Orthopedics;  Laterality: Right;  . TUBAL LIGATION  1989  . UMBILICAL HERNIA REPAIR  1990's X 2    Prior to Admission medications   Medication Sig Start Date End Date Taking? Authorizing Provider  acetaminophen (TYLENOL) 500 MG tablet Take 500-1,000 mg by mouth every 6 (six) hours as needed (FOR PAIN.).  Yes [provider]  aspirin EC 81 MG tablet Take 81 mg by mouth daily.    Yes [provider]  carvedilol (COREG) 6.25 MG tablet Take 6.25 mg by mouth 2 (two) times daily with a meal.   Yes [provider]  clonazePAM (KLONOPIN) 0.5 MG tablet TAKE 1 TABLET BY MOUTH AT BEDTIME Patient taking differently: Take 0.5 mg by mouth at bedtime.  05/15/19  Yes Kathrynn Ducking, MD  ezetimibe (ZETIA) 10 MG tablet Take 10 mg by mouth every evening.  05/10/16  Yes [provider]  fenofibrate 160 MG tablet Take 160 mg by mouth every evening.  03/08/17  Yes [provider]  folic acid-vitamin b complex-vitamin c-selenium-zinc (DIALYVITE) 3 MG TABS tablet Take 1 tablet by mouth daily.   Yes [provider]  Lanthanum Carbonate (FOSRENOL) 1000 MG PACK Take 2,000 mg by mouth 3 (three) times daily.    Yes [provider]  levothyroxine (SYNTHROID, LEVOTHROID) 50 MCG tablet Take 50 mcg by mouth  daily before breakfast.    Yes [provider]  lidocaine-prilocaine (EMLA) cream Apply 1 application topically See admin instructions. Apply a small amount to access site 1-2 hours as needed before dialysis--cover with occlusive dressing 09/24/17  Yes [provider]  loperamide (IMODIUM A-D) 2 MG tablet Take 2-4 mg by mouth every Monday, Wednesday, and Friday with hemodialysis.    Yes [provider]  nortriptyline (PAMELOR) 10 MG capsule Take 2 capsules (20 mg total) by mouth at bedtime. 08/14/19  Yes Kathrynn Ducking, MD  omeprazole (PRILOSEC) 20 MG capsule Take 20 mg by mouth in the morning and at bedtime.   Yes [provider]  sitaGLIPtin (JANUVIA) 25 MG tablet Take 25 mg by mouth daily.   Yes [provider]  Vitamin D, Ergocalciferol, (DRISDOL) 1.25 MG (50000 UT) CAPS capsule Take 50,000 Units by mouth every 30 (thirty) days. 04/22/19  Yes [provider]  HYDROcodone-acetaminophen (NORCO/VICODIN) 5-325 MG tablet Take 1-2 tablets by mouth every 6 (six) hours as needed for severe pain. Patient not taking: Reported on 08/23/2019 03/06/19   Joy, Shawn C, PA-C  ondansetron (ZOFRAN ODT) 4 MG disintegrating tablet Take 1 tablet (4 mg total) by mouth every 8 (eight) hours as needed for nausea or vomiting. Patient not taking: Reported on 05/30/2019 05/18/19   Jackquline Denmark, MD  pantoprazole (PROTONIX) 40 MG tablet Take 1 tablet (40 mg total) by mouth 2 (two) times daily. Patient not taking: Reported on 08/23/2019 05/18/19   Jackquline Denmark, MD    Current Facility-Administered Medications  Medication Dose Route Frequency Provider Last Rate Last Admin  . 0.9 %  sodium chloride infusion  10 mL/hr Intravenous Once Valarie Merino, MD       Current Outpatient Medications  Medication Sig Dispense Refill  . acetaminophen (TYLENOL) 500 MG tablet Take 500-1,000 mg by mouth every 6 (six) hours as needed (FOR PAIN.).    Marland Kitchen aspirin EC 81 MG tablet Take 81 mg  by mouth daily.     . carvedilol (COREG) 6.25 MG tablet Take 6.25 mg by mouth 2 (two) times daily with a meal.    . clonazePAM (KLONOPIN) 0.5 MG tablet TAKE 1 TABLET BY MOUTH AT BEDTIME (Patient taking differently: Take 0.5 mg by mouth at bedtime. ) 90 tablet 1  . ezetimibe (ZETIA) 10 MG tablet Take 10 mg by mouth every evening.   1  . fenofibrate 160 MG tablet Take 160 mg by mouth every  evening.   1  . folic acid-vitamin b complex-vitamin c-selenium-zinc (DIALYVITE) 3 MG TABS tablet Take 1 tablet by mouth daily.    . Lanthanum Carbonate (FOSRENOL) 1000 MG PACK Take 2,000 mg by mouth 3 (three) times daily.     Marland Kitchen levothyroxine (SYNTHROID, LEVOTHROID) 50 MCG tablet Take 50 mcg by mouth daily before breakfast.     . lidocaine-prilocaine (EMLA) cream Apply 1 application topically See admin instructions. Apply a small amount to access site 1-2 hours as needed before dialysis--cover with occlusive dressing  12  . loperamide (IMODIUM A-D) 2 MG tablet Take 2-4 mg by mouth every Monday, Wednesday, and Friday with hemodialysis.     Marland Kitchen nortriptyline (PAMELOR) 10 MG capsule Take 2 capsules (20 mg total) by mouth at bedtime. 180 capsule 3  . omeprazole (PRILOSEC) 20 MG capsule Take 20 mg by mouth in the morning and at bedtime.    . sitaGLIPtin (JANUVIA) 25 MG tablet Take 25 mg by mouth daily.    . Vitamin D, Ergocalciferol, (DRISDOL) 1.25 MG (50000 UT) CAPS capsule Take 50,000 Units by mouth every 30 (thirty) days.    Marland Kitchen HYDROcodone-acetaminophen (NORCO/VICODIN) 5-325 MG tablet Take 1-2 tablets by mouth every 6 (six) hours as needed for severe pain. (Patient not taking: Reported on 08/23/2019) 10 tablet 0  . ondansetron (ZOFRAN ODT) 4 MG disintegrating tablet Take 1 tablet (4 mg total) by mouth every 8 (eight) hours as needed for nausea or vomiting. (Patient not taking: Reported on 05/30/2019) 20 tablet 0  . pantoprazole (PROTONIX) 40 MG tablet Take 1 tablet (40 mg total) by mouth 2 (two) times daily. (Patient not  taking: Reported on 08/23/2019) 60 tablet 3    Allergies as of 08/26/2019 - Review Complete 09/05/2019  Allergen Reaction Noted  . Sulfa antibiotics Anaphylaxis 06/29/2011  . Amoxicillin Hives 06/29/2011  . Crestor [rosuvastatin calcium] Other (See Comments) 02/27/2016  . Ibuprofen Hives 06/29/2011  . Naldecon senior [guaifenesin] Hives 06/29/2011  . Statins Hives 01/08/2017  . Chlorphen-phenyleph-asa Rash 03/28/2015  . Gabapentin Other (See Comments) 01/14/2017    Family History  Problem Relation Age of Onset  . Epilepsy Mother   . Diabetes Mother   . Cancer Mother   . Alcohol abuse Father     Social History   Socioeconomic History  . Marital status: Divorced    Spouse name: Not on file  . Number of children: 4  . Years of education: Some college  . Highest education level: Not on file  Occupational History  . Occupation: Disabled  Tobacco Use  . Smoking status: Former Smoker    Years: 3.00    Types: Cigarettes  . Smokeless tobacco: Never Used  . Tobacco comment: "stopped smoking in the 1970's"  Substance and Sexual Activity  . Alcohol use: No  . Drug use: Not Currently    Types: Marijuana, Hydromorphone    Comment: as a teenager  . Sexual activity: Not Currently  Other Topics Concern  . Not on file  Social History Narrative   Lives at home with sons   Caffeine use: Coffee-decaf   Right-handed   Social Determinants of Health   Financial Resource Strain:   . Difficulty of Paying Living Expenses:   Food Insecurity:   . Worried About Charity fundraiser in the Last Year:   . Arboriculturist in the Last Year:   Transportation Needs:   . Film/video editor (Medical):   Marland Kitchen Lack of Transportation (Non-Medical):   Physical  Activity:   . Days of Exercise per Week:   . Minutes of Exercise per Session:   Stress:   . Feeling of Stress :   Social Connections:   . Frequency of Communication with Friends and Family:   . Frequency of Social Gatherings with  Friends and Family:   . Attends Religious Services:   . Active Member of Clubs or Organizations:   . Attends Archivist Meetings:   Marland Kitchen Marital Status:   Intimate Partner Violence:   . Fear of Current or Ex-Partner:   . Emotionally Abused:   Marland Kitchen Physically Abused:   . Sexually Abused:     Review of Systems: All systems reviewed and negative except where noted in HPI.  Physical Exam: Vital signs in last 24 hours: Temp:  [97.6 F (36.4 C)-98.1 F (36.7 C)] 98.1 F (36.7 C) (03/21 1547) Pulse Rate:  [70-80] 74 (03/21 1547) Resp:  [12-15] 13 (03/21 1547) BP: (85-112)/(37-75) 95/43 (03/21 1547) SpO2:  [96 %-100 %] 98 % (03/21 1547)   General:   Awake, alert, NAD, chronically ill-appearing Psych:  Pleasant, cooperative. Normal mood and affect. Eyes:  Pupils equal, sclera clear, no icterus, conjunctiva pale.    Neck:  Supple; no masses Lungs:  Clear throughout to auscultation anteriorly Heart:  Regular rate and rhythm; no murmurs, no lower extremity edema Abdomen:  Soft, obese, non-distended, nontender, BS active, no palp mass   Rectal: No masses, liquid dark red very thin stool in the diaper and rectal vault, nontender Msk:  Symmetrical without gross deformities. . Neurologic:  Alert and  oriented x4;  grossly normal neurologically. Skin:  Intact without significant lesions or rashes. Ext: trace pretib edema, no c/c   Intake/Output from previous day: No intake/output data recorded. Intake/Output this shift: No intake/output data recorded.  Lab Results: Recent Labs    08/18/2019 1247 08/11/2019 1321  WBC 5.2  --   HGB PENDING 5.4*  HCT PENDING 16.0*  PLT 151  --    BMET Recent Labs    08/09/2019 1247 08/12/2019 1321  NA 140 139  K 3.6 3.5  CL 103 101  CO2 24  --   GLUCOSE 107* 95  BUN 20 24*  CREATININE 5.43* 5.80*  CALCIUM 7.0*  --    LFT Recent Labs    08/17/2019 1247  PROT 4.3*  ALBUMIN 1.5*  AST 36  ALT 17  ALKPHOS 50  BILITOT 1.4*    PT/INR Recent Labs    08/21/2019 1247  LABPROT 23.6*  INR 2.1*   Hepatitis Panel No results for input(s): HEPBSAG, HCVAB, HEPAIGM, HEPBIGM in the last 72 hours.   . CBC Latest Ref Rng & Units 09/04/2019 08/19/2019 08/23/2019  WBC 4.0 - 10.5 K/uL - 5.2 -  Hemoglobin 12.0 - 15.0 g/dL 5.4(LL) PENDING 8.8(L)  Hematocrit 36.0 - 46.0 % 16.0(L) PENDING 26.0(L)  Platelets 150 - 400 K/uL - 151 -    . CMP Latest Ref Rng & Units 08/12/2019 09/01/2019 08/23/2019  Glucose 70 - 99 mg/dL 95 107(H) -  BUN 8 - 23 mg/dL 24(H) 20 -  Creatinine 0.44 - 1.00 mg/dL 5.80(H) 5.43(H) -  Sodium 135 - 145 mmol/L 139 140 136  Potassium 3.5 - 5.1 mmol/L 3.5 3.6 3.3(L)  Chloride 98 - 111 mmol/L 101 103 -  CO2 22 - 32 mmol/L - 24 -  Calcium 8.9 - 10.3 mg/dL - 7.0(L) -  Total Protein 6.5 - 8.1 g/dL - 4.3(L) -  Total Bilirubin 0.3 -  1.2 mg/dL - 1.4(H) -  Alkaline Phos 38 - 126 U/L - 50 -  AST 15 - 41 U/L - 36 -  ALT 0 - 44 U/L - 17 -   Studies/Results: DG Chest Port 1 View  Result Date: 08/10/2019 CLINICAL DATA:  Pt here from home for black stools with red streaks, weakness, pale, abdominal pain, and hypotension. Dialysis patient. EXAM: PORTABLE CHEST 1 VIEW COMPARISON:  Chest radiograph 08/09/2019 FINDINGS: Stable cardiomediastinal contours. Low lung volumes. Scattered bibasilar opacities likely reflecting atelectasis. No new focal consolidation. No pneumothorax or significant pleural effusion. Status post right shoulder arthroplasty. IMPRESSION: Low lung volumes with bibasilar atelectasis. Electronically Signed   By: Audie Pinto M.D.   On: 08/17/2019 13:31    Active Problems:   * No active hospital problems. Roberta Bryant. Roberta Bryant, M.D. @  08/11/2019, 4:05 PM

## 2019-08-27 NOTE — ED Notes (Signed)
RN unable to obtain labs, pt receiving blood transfusion

## 2019-08-27 NOTE — Consult Note (Signed)
Referring Provider:  Dene Gentry, MD; Emergency Room         Primary Care Physician:  [name], MD Primary Gastroenterologist:  Carmell Austria, MD          Reason for Consultation:  Acute gi bleeding                 ASSESSMENT /  PLAN   69 y.o. 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  1.  GI bleeding --based on the characteristics of the stool, unclear source though I favor upper GI bleeding.  Hemodynamically she is stable despite being profoundly anemic with initial hemoglobin less than 5.0.  She needs to be resuscitated fully and then will need to have upper endoscopy.  She has been started on PPI.  We have 1 peripheral IV and the IV team will try a second IV.  If unsuccessful would ask critical care to obtain central access.  Would begin PPI infusion. --Resuscitation, continue blood transfusion, goal hemoglobin greater than 7 --PPI infusion --Maintain 2 peripheral, large-bore, IVs; central line if second IV unobtainable --N.p.o. for now --EGD when medically appropriate, will tentatively scheduled for 7:30 AM tomorrow with Dr. Tarri Glenn. --Hold aspirin --Vitamin K to correct INR  2.  Coagulopathy --INR is elevated at 2.1.  This may be due to malnutrition. --Vitamin K x3 days  3.  ESRD --on hemodialysis, usually Monday Wednesday Friday, nephrology will consult    HPI:     QUINETTA SHILLING is a 69 y.o. female with end-stage renal disease on dialysis, CHF, hypertension, hyperlipidemia, hypothyroidism, diabetes, several hospitalizations in February and March 2021 for COVID-19 and related complication who presents with bloody stools.  She is known to Dr. Lyndel Safe from prior GI evaluation including colonoscopy in February 2012 which was negative.  EGD in May  2014 revealing hyperplastic gastric polyps, normal small bowel biopsies.  Dr. Lyndel Safe last saw her by televisit in December 2020 with intermittent nausea and vomiting complaint as well as alternating bowel habits.  At that time he changed omeprazole to Protonix 40 twice daily added Zofran for nausea.  They considered upper endoscopy after cardiac clearance if symptoms worsen.  She was to follow-up in about 12 weeks.  Patient stated that she started bleeding from her dialysis fistula on Friday.  She reports that the blood was brisk and difficult to control.  Eventually this did abate.  And then yesterday she developed dark bloody stools.  She reported feeling very weak and tired.  Diffuse abdominal discomfort at home.  Denies abdominal pain now.  Still feels weak.  Appetite has been very decreased but no nausea or vomiting.  She did complete dialysis session on Friday.  When EMS responded the patient had hypotension with systolic blood pressure around 80.  She received bolus of fluids on route to the ER.  Once here again mildly hypotensive and emergency release blood was given x1 unit.  She was never tachycardic.  Pressure is now are ranging from 49-702 systolic.  Heart rate 70-80.  Afebrile.  Unit #2 of red cells is being hung now by the nurse.  She takes aspirin but no other antiplatelet and no anticoagulants.   I-STAT hemoglobin was 5.4 though regular CBC not resulted the lab called with a hemoglobin of 4.7.  Platelets normal, white count 5.2, INR is  2.1    Past Medical History:  Diagnosis Date  . Anemia   . Aortic stenosis   . CHF (congestive heart failure) ([place])   . Childhood asthma   . Chronic upper back pain   . Coronary artery disease    Stent to left main coronary artery second of August 2017  . Decreased hearing   . ESRD (end stage renal disease) on dialysis [facility])    "MWF; [facility] in [place]" (01/06/2018)  . Gait abnormality 08/28/2016  . GERD (gastroesophageal reflux disease)   .  Headache    "a few times/week" (10/30/2014)  . Headache syndrome 01/08/2017  . Heart murmur   . History of blood transfusion 1996   "related to menses"  . Hyperlipidemia   . Hypertension   . Hypothyroidism   . IBS (irritable bowel syndrome)   . Myocardial infarction ([place]) 2004  . Osteoarthritis   . Pneumonia    "years ago"  . Rotator cuff arthropathy of right shoulder   . Spondylosis of cervical spine 06/16/2017  . Stroke ([place]) 07/2016   mini stroke , right side of body weak-   . Type II diabetes mellitus ([place])     Past Surgical History:  Procedure Laterality Date  . A/V FISTULAGRAM Left 05/31/2018   Procedure: A/V FISTULAGRAM;  Surgeon: Serafina Mitchell, MD;  Location: [place] [facility];  Service: Cardiovascular;  Laterality: Left;  . APPENDECTOMY    . AV FISTULA PLACEMENT Left 12/03/2006   "forearm"  . AV FISTULA REPAIR Left 11/25/2009   "forearm"  . CARPAL TUNNEL RELEASE Right 02/11/2017   Procedure: RIGHT CARPAL TUNNEL RELEASE;  Surgeon: Daryll Brod, MD;  Location: [place];  Service: Orthopedics;  Laterality: Right;  . CHOLECYSTECTOMY OPEN  1970's  . COLONOSCOPY W/ POLYPECTOMY  07/30/2010   Minimal sigmoid diverticulosis. Small internal hemorrhoids. Otherwise normal colonoscopy to TI.   Marland Kitchen CORONARY ANGIOPLASTY    . CORONARY ANGIOPLASTY WITH STENT PLACEMENT    . ESOPHAGOGASTRODUODENOSCOPY  10/21/2012   Gastric polyps- status post polypectomy. Mild gastritis.   . INSERTION OF DIALYSIS CATHETER Right 2010   "chest"  . PARATHYROIDECTOMY  2010?   Parathyroid autotransplantation   . REVERSE SHOULDER ARTHROPLASTY Right 01/06/2018  . REVERSE SHOULDER ARTHROPLASTY Right 01/06/2018   Procedure: RIGHT REVERSE SHOULDER ARTHROPLASTY;  Surgeon: [name], MD;  Location: [place];  Service: Orthopedics;  Laterality: Right;  . REVISON OF ARTERIOVENOUS FISTULA Left 12/04/2013   Procedure: REVISON OF ARTERIOVENOUS FISTULA;  Surgeon: Rosetta Posner, MD;  Location: [place];   Service: Vascular;  Laterality: Left;  . REVISON OF ARTERIOVENOUS FISTULA Left 10/07/7780   Procedure: PLICATION OF LEFT BRACHIOCEPHALIC  ARTERIOVENOUS FISTULA;  Surgeon: Conrad Middlefield, MD;  Location: [place];  Service: Vascular;  Laterality: Left;  . SHOULDER ARTHROSCOPY WITH ROTATOR CUFF REPAIR AND SUBACROMIAL DECOMPRESSION Right 06/22/2017   Procedure: RIGHT SHOULDER ARTHROSCOPY WITH EXTENSIVE DEBRIDEMENT;  Surgeon: Mcarthur Rossetti, MD;  Location: [place];  Service: Orthopedics;  Laterality: Right;  . TRIGGER FINGER RELEASE Right 10/22/2017   Procedure: RELEASE RIGHT INDEX TRIGGER FINGER/A-1 PULLEY;  Surgeon: Daryll Brod, MD;  Location: [place];  Service: Orthopedics;  Laterality: Right;  . TUBAL LIGATION  1989  . UMBILICAL HERNIA REPAIR  1990's X 2    Prior to Admission medications   Medication Sig Start Date End Date Taking? Authorizing Provider  acetaminophen (TYLENOL) 500 MG tablet Take 500-1,000 mg by mouth every 6 (six) hours as needed (FOR PAIN.).  Yes [provider]  aspirin EC 81 MG tablet Take 81 mg by mouth daily.    Yes [provider]  carvedilol (COREG) 6.25 MG tablet Take 6.25 mg by mouth 2 (two) times daily with a meal.   Yes [provider]  clonazePAM (KLONOPIN) 0.5 MG tablet TAKE 1 TABLET BY MOUTH AT BEDTIME Patient taking differently: Take 0.5 mg by mouth at bedtime.  05/15/19  Yes Kathrynn Ducking, MD  ezetimibe (ZETIA) 10 MG tablet Take 10 mg by mouth every evening.  05/10/16  Yes [provider]  fenofibrate 160 MG tablet Take 160 mg by mouth every evening.  03/08/17  Yes [provider]  folic acid-vitamin b complex-vitamin c-selenium-zinc (DIALYVITE) 3 MG TABS tablet Take 1 tablet by mouth daily.   Yes [provider]  Lanthanum Carbonate (FOSRENOL) 1000 MG PACK Take 2,000 mg by mouth 3 (three) times daily.    Yes [provider]  levothyroxine (SYNTHROID, LEVOTHROID) 50 MCG tablet Take 50 mcg by mouth  daily before breakfast.    Yes [provider]  lidocaine-prilocaine (EMLA) cream Apply 1 application topically See admin instructions. Apply a small amount to access site 1-2 hours as needed before dialysis--cover with occlusive dressing 09/24/17  Yes [provider]  loperamide (IMODIUM A-D) 2 MG tablet Take 2-4 mg by mouth every Monday, Wednesday, and Friday with hemodialysis.    Yes [provider]  nortriptyline (PAMELOR) 10 MG capsule Take 2 capsules (20 mg total) by mouth at bedtime. 08/14/19  Yes Kathrynn Ducking, MD  omeprazole (PRILOSEC) 20 MG capsule Take 20 mg by mouth in the morning and at bedtime.   Yes [provider]  sitaGLIPtin (JANUVIA) 25 MG tablet Take 25 mg by mouth daily.   Yes [provider]  Vitamin D, Ergocalciferol, (DRISDOL) 1.25 MG (50000 UT) CAPS capsule Take 50,000 Units by mouth every 30 (thirty) days. 04/22/19  Yes [provider]  HYDROcodone-acetaminophen (NORCO/VICODIN) 5-325 MG tablet Take 1-2 tablets by mouth every 6 (six) hours as needed for severe pain. Patient not taking: Reported on 08/23/2019 03/06/19   Joy, Shawn C, PA-C  ondansetron (ZOFRAN ODT) 4 MG disintegrating tablet Take 1 tablet (4 mg total) by mouth every 8 (eight) hours as needed for nausea or vomiting. Patient not taking: Reported on 05/30/2019 05/18/19   Jackquline Denmark, MD  pantoprazole (PROTONIX) 40 MG tablet Take 1 tablet (40 mg total) by mouth 2 (two) times daily. Patient not taking: Reported on 08/23/2019 05/18/19   Jackquline Denmark, MD    Current Facility-Administered Medications  Medication Dose Route Frequency Provider Last Rate Last Admin  . 0.9 %  sodium chloride infusion  10 mL/hr Intravenous Once Valarie Merino, MD       Current Outpatient Medications  Medication Sig Dispense Refill  . acetaminophen (TYLENOL) 500 MG tablet Take 500-1,000 mg by mouth every 6 (six) hours as needed (FOR PAIN.).    Marland Kitchen aspirin EC 81 MG tablet Take 81 mg  by mouth daily.     . carvedilol (COREG) 6.25 MG tablet Take 6.25 mg by mouth 2 (two) times daily with a meal.    . clonazePAM (KLONOPIN) 0.5 MG tablet TAKE 1 TABLET BY MOUTH AT BEDTIME (Patient taking differently: Take 0.5 mg by mouth at bedtime. ) 90 tablet 1  . ezetimibe (ZETIA) 10 MG tablet Take 10 mg by mouth every evening.   1  . fenofibrate 160 MG tablet Take 160 mg by mouth every  evening.   1  . folic acid-vitamin b complex-vitamin c-selenium-zinc (DIALYVITE) 3 MG TABS tablet Take 1 tablet by mouth daily.    . Lanthanum Carbonate (FOSRENOL) 1000 MG PACK Take 2,000 mg by mouth 3 (three) times daily.     Marland Kitchen levothyroxine (SYNTHROID, LEVOTHROID) 50 MCG tablet Take 50 mcg by mouth daily before breakfast.     . lidocaine-prilocaine (EMLA) cream Apply 1 application topically See admin instructions. Apply a small amount to access site 1-2 hours as needed before dialysis--cover with occlusive dressing  12  . loperamide (IMODIUM A-D) 2 MG tablet Take 2-4 mg by mouth every Monday, Wednesday, and Friday with hemodialysis.     Marland Kitchen nortriptyline (PAMELOR) 10 MG capsule Take 2 capsules (20 mg total) by mouth at bedtime. 180 capsule 3  . omeprazole (PRILOSEC) 20 MG capsule Take 20 mg by mouth in the morning and at bedtime.    . sitaGLIPtin (JANUVIA) 25 MG tablet Take 25 mg by mouth daily.    . Vitamin D, Ergocalciferol, (DRISDOL) 1.25 MG (50000 UT) CAPS capsule Take 50,000 Units by mouth every 30 (thirty) days.    Marland Kitchen HYDROcodone-acetaminophen (NORCO/VICODIN) 5-325 MG tablet Take 1-2 tablets by mouth every 6 (six) hours as needed for severe pain. (Patient not taking: Reported on 08/23/2019) 10 tablet 0  . ondansetron (ZOFRAN ODT) 4 MG disintegrating tablet Take 1 tablet (4 mg total) by mouth every 8 (eight) hours as needed for nausea or vomiting. (Patient not taking: Reported on 05/30/2019) 20 tablet 0  . pantoprazole (PROTONIX) 40 MG tablet Take 1 tablet (40 mg total) by mouth 2 (two) times daily. (Patient not  taking: Reported on 08/23/2019) 60 tablet 3    Allergies as of 08/19/2019 - Review Complete 08/10/2019  Allergen Reaction Noted  . Sulfa antibiotics Anaphylaxis 06/29/2011  . Amoxicillin Hives 06/29/2011  . Crestor [rosuvastatin calcium] Other (See Comments) 02/27/2016  . Ibuprofen Hives 06/29/2011  . Naldecon senior [guaifenesin] Hives 06/29/2011  . Statins Hives 01/08/2017  . Chlorphen-phenyleph-asa Rash 03/28/2015  . Gabapentin Other (See Comments) 01/14/2017    Family History  Problem Relation Age of Onset  . Epilepsy Mother   . Diabetes Mother   . Cancer Mother   . Alcohol abuse Father     Social History   Socioeconomic History  . Marital status: Divorced    Spouse name: Not on file  . Number of children: 4  . Years of education: Some college  . Highest education level: Not on file  Occupational History  . Occupation: Disabled  Tobacco Use  . Smoking status: Former Smoker    Years: 3.00    Types: Cigarettes  . Smokeless tobacco: Never Used  . Tobacco comment: "stopped smoking in the 1970's"  Substance and Sexual Activity  . Alcohol use: No  . Drug use: Not Currently    Types: Marijuana, Hydromorphone    Comment: as a teenager  . Sexual activity: Not Currently  Other Topics Concern  . Not on file  Social History Narrative   Lives at home with sons   Caffeine use: Coffee-decaf   Right-handed   Social Determinants of Health   Financial Resource Strain:   . Difficulty of Paying Living Expenses:   Food Insecurity:   . Worried About Charity fundraiser in the Last Year:   . Arboriculturist in the Last Year:   Transportation Needs:   . Film/video editor (Medical):   Marland Kitchen Lack of Transportation (Non-Medical):   Physical  Activity:   . Days of Exercise per Week:   . Minutes of Exercise per Session:   Stress:   . Feeling of Stress :   Social Connections:   . Frequency of Communication with Friends and Family:   . Frequency of Social Gatherings with  Friends and Family:   . Attends Religious Services:   . Active Member of Clubs or Organizations:   . Attends Archivist Meetings:   Marland Kitchen Marital Status:   Intimate Partner Violence:   . Fear of Current or Ex-Partner:   . Emotionally Abused:   Marland Kitchen Physically Abused:   . Sexually Abused:     Review of Systems: All systems reviewed and negative except where noted in HPI.  Physical Exam: Vital signs in last 24 hours: Temp:  [97.6 F (36.4 C)-98.1 F (36.7 C)] 98.1 F (36.7 C) (03/21 1547) Pulse Rate:  [70-80] 74 (03/21 1547) Resp:  [12-15] 13 (03/21 1547) BP: (85-112)/(37-75) 95/43 (03/21 1547) SpO2:  [96 %-100 %] 98 % (03/21 1547)   General:   Awake, alert, NAD, chronically ill-appearing Psych:  Pleasant, cooperative. Normal mood and affect. Eyes:  Pupils equal, sclera clear, no icterus, conjunctiva pale.    Neck:  Supple; no masses Lungs:  Clear throughout to auscultation anteriorly Heart:  Regular rate and rhythm; no murmurs, no lower extremity edema Abdomen:  Soft, obese, non-distended, nontender, BS active, no palp mass   Rectal: No masses, liquid dark red very thin stool in the diaper and rectal vault, nontender Msk:  Symmetrical without gross deformities. . Neurologic:  Alert and  oriented x4;  grossly normal neurologically. Skin:  Intact without significant lesions or rashes. Ext: trace pretib edema, no c/c   Intake/Output from previous day: No intake/output data recorded. Intake/Output this shift: No intake/output data recorded.  Lab Results: Recent Labs    08/22/2019 1247 09/05/2019 1321  WBC 5.2  --   HGB PENDING 5.4*  HCT PENDING 16.0*  PLT 151  --    BMET Recent Labs    08/19/2019 1247 09/06/2019 1321  NA 140 139  K 3.6 3.5  CL 103 101  CO2 24  --   GLUCOSE 107* 95  BUN 20 24*  CREATININE 5.43* 5.80*  CALCIUM 7.0*  --    LFT Recent Labs    08/08/2019 1247  PROT 4.3*  ALBUMIN 1.5*  AST 36  ALT 17  ALKPHOS 50  BILITOT 1.4*    PT/INR Recent Labs    08/29/2019 1247  LABPROT 23.6*  INR 2.1*   Hepatitis Panel No results for input(s): HEPBSAG, HCVAB, HEPAIGM, HEPBIGM in the last 72 hours.   . CBC Latest Ref Rng & Units 08/22/2019 09/06/2019 08/23/2019  WBC 4.0 - 10.5 K/uL - 5.2 -  Hemoglobin 12.0 - 15.0 g/dL 5.4(LL) PENDING 8.8(L)  Hematocrit 36.0 - 46.0 % 16.0(L) PENDING 26.0(L)  Platelets 150 - 400 K/uL - 151 -    . CMP Latest Ref Rng & Units 08/08/2019 08/11/2019 08/23/2019  Glucose 70 - 99 mg/dL 95 107(H) -  BUN 8 - 23 mg/dL 24(H) 20 -  Creatinine 0.44 - 1.00 mg/dL 5.80(H) 5.43(H) -  Sodium 135 - 145 mmol/L 139 140 136  Potassium 3.5 - 5.1 mmol/L 3.5 3.6 3.3(L)  Chloride 98 - 111 mmol/L 101 103 -  CO2 22 - 32 mmol/L - 24 -  Calcium 8.9 - 10.3 mg/dL - 7.0(L) -  Total Protein 6.5 - 8.1 g/dL - 4.3(L) -  Total Bilirubin 0.3 -  1.2 mg/dL - 1.4(H) -  Alkaline Phos 38 - 126 U/L - 50 -  AST 15 - 41 U/L - 36 -  ALT 0 - 44 U/L - 17 -   Studies/Results: DG Chest Port 1 View  Result Date: 08/21/2019 CLINICAL DATA:  Pt here from home for black stools with red streaks, weakness, pale, abdominal pain, and hypotension. Dialysis patient. EXAM: PORTABLE CHEST 1 VIEW COMPARISON:  Chest radiograph 08/09/2019 FINDINGS: Stable cardiomediastinal contours. Low lung volumes. Scattered bibasilar opacities likely reflecting atelectasis. No new focal consolidation. No pneumothorax or significant pleural effusion. Status post right shoulder arthroplasty. IMPRESSION: Low lung volumes with bibasilar atelectasis. Electronically Signed   By: Audie Pinto M.D.   On: 08/21/2019 13:31    Active Problems:   * No active hospital problems. Lajuan Lines. Gailyn Crook, M.D. @  08/26/2019, 4:05 PM

## 2019-08-27 NOTE — ED Notes (Signed)
RN unable to obtain 2nd IV

## 2019-08-28 ENCOUNTER — Inpatient Hospital Stay (HOSPITAL_COMMUNITY): Payer: Medicare Other | Admitting: Certified Registered"

## 2019-08-28 ENCOUNTER — Inpatient Hospital Stay (HOSPITAL_COMMUNITY): Payer: Medicare Other

## 2019-08-28 ENCOUNTER — Encounter (HOSPITAL_COMMUNITY): Admission: EM | Disposition: E | Payer: Self-pay | Source: Home / Self Care | Attending: Internal Medicine

## 2019-08-28 ENCOUNTER — Other Ambulatory Visit: Payer: Self-pay | Admitting: Physician Assistant

## 2019-08-28 DIAGNOSIS — K922 Gastrointestinal hemorrhage, unspecified: Secondary | ICD-10-CM | POA: Diagnosis not present

## 2019-08-28 DIAGNOSIS — K317 Polyp of stomach and duodenum: Secondary | ICD-10-CM

## 2019-08-28 HISTORY — PX: BIOPSY: SHX5522

## 2019-08-28 HISTORY — PX: ESOPHAGOGASTRODUODENOSCOPY (EGD) WITH PROPOFOL: SHX5813

## 2019-08-28 LAB — RENAL FUNCTION PANEL
Albumin: 1.6 g/dL — ABNORMAL LOW (ref 3.5–5.0)
Anion gap: 11 (ref 5–15)
BUN: 12 mg/dL (ref 8–23)
CO2: 25 mmol/L (ref 22–32)
Calcium: 6.7 mg/dL — ABNORMAL LOW (ref 8.9–10.3)
Chloride: 100 mmol/L (ref 98–111)
Creatinine, Ser: 3.33 mg/dL — ABNORMAL HIGH (ref 0.44–1.00)
GFR calc Af Amer: 16 mL/min — ABNORMAL LOW (ref >60)
GFR calc non Af Amer: 13 mL/min — ABNORMAL LOW (ref >60)
Glucose, Bld: 86 mg/dL (ref 70–99)
Phosphorus: 1.8 mg/dL — ABNORMAL LOW (ref 2.5–4.6)
Potassium: 4.1 mmol/L (ref 3.5–5.1)
Sodium: 136 mmol/L (ref 135–145)

## 2019-08-28 LAB — HEMOGLOBIN AND HEMATOCRIT, BLOOD
HCT: 27.2 % — ABNORMAL LOW (ref 36.0–46.0)
Hemoglobin: 8.7 g/dL — ABNORMAL LOW (ref 12.0–15.0)

## 2019-08-28 LAB — CBC
HCT: 26.7 % — ABNORMAL LOW (ref 36.0–46.0)
HCT: 25.5 % — ABNORMAL LOW (ref 36.0–46.0)
HCT: 27.3 % — ABNORMAL LOW (ref 36.0–46.0)
Hemoglobin: 8.6 g/dL — ABNORMAL LOW (ref 12.0–15.0)
Hemoglobin: 8.3 g/dL — ABNORMAL LOW (ref 12.0–15.0)
Hemoglobin: 8.6 g/dL — ABNORMAL LOW (ref 12.0–15.0)
MCH: 32.2 pg (ref 26.0–34.0)
MCH: 32.2 pg (ref 26.0–34.0)
MCH: 32.7 pg (ref 26.0–34.0)
MCHC: 32.2 g/dL (ref 30.0–36.0)
MCHC: 32.5 g/dL (ref 30.0–36.0)
MCHC: 31.5 g/dL (ref 30.0–36.0)
MCV: 100 fL (ref 80.0–100.0)
MCV: 98.8 fL (ref 80.0–100.0)
MCV: 103.8 fL — ABNORMAL HIGH (ref 80.0–100.0)
Platelets: 143 10*3/uL — ABNORMAL LOW (ref 150–400)
Platelets: 134 10*3/uL — ABNORMAL LOW (ref 150–400)
Platelets: 142 10*3/uL — ABNORMAL LOW (ref 150–400)
RBC: 2.67 MIL/uL — ABNORMAL LOW (ref 3.87–5.11)
RBC: 2.58 MIL/uL — ABNORMAL LOW (ref 3.87–5.11)
RBC: 2.63 MIL/uL — ABNORMAL LOW (ref 3.87–5.11)
RDW: 27.8 % — ABNORMAL HIGH (ref 11.5–15.5)
RDW: 27.9 % — ABNORMAL HIGH (ref 11.5–15.5)
RDW: 28.4 % — ABNORMAL HIGH (ref 11.5–15.5)
WBC: 5.9 10*3/uL (ref 4.0–10.5)
WBC: 5.9 10*3/uL (ref 4.0–10.5)
WBC: 7 10*3/uL (ref 4.0–10.5)
nRBC: 0 % (ref 0.0–0.2)
nRBC: 0 % (ref 0.0–0.2)
nRBC: 0 % (ref 0.0–0.2)

## 2019-08-28 LAB — GLUCOSE, CAPILLARY
Glucose-Capillary: 87 mg/dL (ref 70–99)
Glucose-Capillary: 88 mg/dL (ref 70–99)
Glucose-Capillary: 89 mg/dL (ref 70–99)
Glucose-Capillary: 89 mg/dL (ref 70–99)

## 2019-08-28 LAB — POCT I-STAT, CHEM 8
BUN: 28 mg/dL — ABNORMAL HIGH (ref 8–23)
Calcium, Ion: 0.91 mmol/L — ABNORMAL LOW (ref 1.15–1.40)
Chloride: 104 mmol/L (ref 98–111)
Creatinine, Ser: 6.3 mg/dL — ABNORMAL HIGH (ref 0.44–1.00)
Glucose, Bld: 88 mg/dL (ref 70–99)
HCT: 25 % — ABNORMAL LOW (ref 36.0–46.0)
Hemoglobin: 8.5 g/dL — ABNORMAL LOW (ref 12.0–15.0)
Potassium: 4 mmol/L (ref 3.5–5.1)
Sodium: 139 mmol/L (ref 135–145)
TCO2: 27 mmol/L (ref 22–32)

## 2019-08-28 LAB — RETICULOCYTES
Immature Retic Fract: 36.1 % — ABNORMAL HIGH (ref 2.3–15.9)
RBC.: 2.62 MIL/uL — ABNORMAL LOW (ref 3.87–5.11)
Retic Count, Absolute: 81 10*3/uL (ref 19.0–186.0)
Retic Ct Pct: 3.1 % (ref 0.4–3.1)

## 2019-08-28 LAB — IRON AND TIBC
Iron: 102 ug/dL (ref 28–170)
Saturation Ratios: 92 % — ABNORMAL HIGH (ref 10.4–31.8)
TIBC: 111 ug/dL — ABNORMAL LOW (ref 250–450)
UIBC: 9 ug/dL

## 2019-08-28 LAB — FOLATE: Folate: 4.4 ng/mL — ABNORMAL LOW (ref >5.9)

## 2019-08-28 LAB — VITAMIN B12: Vitamin B-12: 789 pg/mL (ref 180–914)

## 2019-08-28 LAB — FERRITIN: Ferritin: 940 ng/mL — ABNORMAL HIGH (ref 11–307)

## 2019-08-28 SURGERY — ESOPHAGOGASTRODUODENOSCOPY (EGD) WITH PROPOFOL
Anesthesia: Monitor Anesthesia Care

## 2019-08-28 MED ORDER — SODIUM CHLORIDE 0.9 % IV SOLN
100.0000 mL | INTRAVENOUS | Status: DC | PRN
  Administered 2019-09-07: 100 mL via INTRAVENOUS

## 2019-08-28 MED ORDER — SODIUM CHLORIDE 0.9 % IV SOLN: INTRAVENOUS | Status: DC | PRN

## 2019-08-28 MED ORDER — BISACODYL 5 MG PO TBEC
20.0000 mg | DELAYED_RELEASE_TABLET | Freq: Once | ORAL | Status: AC
  Administered 2019-08-28: 20 mg via ORAL
  Filled 2019-08-28: qty 4

## 2019-08-28 MED ORDER — PROPOFOL 500 MG/50ML IV EMUL
INTRAVENOUS | Status: DC | PRN
  Administered 2019-08-28: 100 ug/kg/min via INTRAVENOUS

## 2019-08-28 MED ORDER — SODIUM CHLORIDE 0.9 % IV SOLN: INTRAVENOUS | Status: DC

## 2019-08-28 MED ORDER — PENTAFLUOROPROP-TETRAFLUOROETH EX AERO: 1.0000 "application " | INHALATION_SPRAY | CUTANEOUS | Status: DC | PRN

## 2019-08-28 MED ORDER — HEPARIN SODIUM (PORCINE) 1000 UNIT/ML DIALYSIS
1000.0000 [IU] | INTRAMUSCULAR | Status: DC | PRN
  Filled 2019-08-28: qty 1

## 2019-08-28 MED ORDER — DARBEPOETIN ALFA 100 MCG/0.5ML IJ SOSY
100.0000 ug | PREFILLED_SYRINGE | INTRAMUSCULAR | Status: DC
  Administered 2019-08-30 – 2019-09-06 (×2): 100 ug via INTRAVENOUS
  Filled 2019-08-28 (×3): qty 0.5

## 2019-08-28 MED ORDER — METOCLOPRAMIDE HCL 5 MG/ML IJ SOLN
10.0000 mg | Freq: Once | INTRAMUSCULAR | Status: AC
  Administered 2019-08-28: 17:00:00 10 mg via INTRAVENOUS
  Filled 2019-08-28: qty 2

## 2019-08-28 MED ORDER — PANTOPRAZOLE SODIUM 40 MG PO TBEC
40.0000 mg | DELAYED_RELEASE_TABLET | Freq: Every day | ORAL | Status: DC
  Administered 2019-08-29 – 2019-09-08 (×9): 40 mg via ORAL
  Filled 2019-08-28 (×9): qty 1

## 2019-08-28 MED ORDER — PEG-KCL-NACL-NASULF-NA ASC-C 100 G PO SOLR
1.0000 | Freq: Once | ORAL | Status: AC
  Administered 2019-08-28: 200 g via ORAL
  Filled 2019-08-28 (×2): qty 1

## 2019-08-28 MED ORDER — LEVOTHYROXINE SODIUM 50 MCG PO TABS
50.0000 ug | ORAL_TABLET | Freq: Every day | ORAL | Status: DC
  Administered 2019-08-29 – 2019-09-08 (×9): 50 ug via ORAL
  Filled 2019-08-28 (×9): qty 1

## 2019-08-28 MED ORDER — PRO-STAT SUGAR FREE PO LIQD
30.0000 mL | Freq: Two times a day (BID) | ORAL | Status: DC
  Administered 2019-08-28 – 2019-09-06 (×8): 30 mL via ORAL
  Filled 2019-08-28 (×15): qty 30

## 2019-08-28 MED ORDER — ALTEPLASE 2 MG IJ SOLR
2.0000 mg | Freq: Once | INTRAMUSCULAR | Status: DC | PRN
  Filled 2019-08-28: qty 2

## 2019-08-28 MED ORDER — RENA-VITE PO TABS
1.0000 | ORAL_TABLET | Freq: Every day | ORAL | Status: DC
  Administered 2019-08-28 – 2019-09-06 (×8): 1 via ORAL
  Filled 2019-08-28 (×9): qty 1

## 2019-08-28 MED ORDER — LIDOCAINE-PRILOCAINE 2.5-2.5 % EX CREA: 1.0000 "application " | TOPICAL_CREAM | CUTANEOUS | Status: DC | PRN

## 2019-08-28 MED ORDER — SODIUM CHLORIDE 0.9 % IV SOLN: 100.0000 mL | INTRAVENOUS | Status: DC | PRN

## 2019-08-28 MED ORDER — LIDOCAINE HCL (PF) 1 % IJ SOLN: 5.0000 mL | INTRAMUSCULAR | Status: DC | PRN

## 2019-08-28 MED ORDER — METOCLOPRAMIDE HCL 5 MG/ML IJ SOLN
10.0000 mg | Freq: Once | INTRAMUSCULAR | Status: AC
  Administered 2019-08-28: 10 mg via INTRAVENOUS
  Filled 2019-08-28: qty 2

## 2019-08-28 SURGICAL SUPPLY — 15 items

## 2019-08-28 NOTE — Progress Notes (Signed)
~  5449 returned from EGD - put on clr liquid diet; to be NPO after midnight for colonoscopy tomorrow ~1120 transport took to HD ~1700 start bowel prep -Sons visiting

## 2019-08-28 NOTE — Progress Notes (Signed)
~  1800:  Report called to 6N for bed 28; s/w Mendel Ryder, RN

## 2019-08-28 NOTE — Anesthesia Preprocedure Evaluation (Addendum)
Anesthesia Evaluation  Patient identified by MRN, date of birth, ID band Patient awake    Reviewed: Allergy & Precautions, NPO status , Patient's Chart, lab work & pertinent test results, reviewed documented beta blocker date and time   Airway Mallampati: III  TM Distance: >3 FB Neck ROM: Full  Mouth opening: Limited Mouth Opening  Dental no notable dental hx. (+) Teeth Intact, Dental Advisory Given   Pulmonary asthma , former smoker,    Pulmonary exam normal breath sounds clear to auscultation       Cardiovascular hypertension, Pt. on medications and Pt. on home beta blockers + CAD, + Past MI, + Cardiac Stents (Left main 2017) and +CHF  Normal cardiovascular exam+ Valvular Problems/Murmurs AS  Rhythm:Regular Rate:Normal  TTE 2019  1. Moderate aortic stenosis. Mild regurgitation.  2. Normal left ventricular systolic function visually estimated ejection fraction is 60 to 65%. Mild concentric left ventricular hypertrophy.  3. Mild left atrial enlargement.  Stress Test 2019 Nuclear stress EF: 52%. There was no ST segment deviation noted during stress. The study is normal. This is a low risk study. The left ventricular ejection fraction is mildly decreased (45-54%). Diaphragmatic attenuation no ischemia or infarct EF 52%   Neuro/Psych  Headaches,  Neuromuscular disease (h/o Parkinson's) CVA (right sided weakness), Residual Symptoms negative psych ROS   GI/Hepatic Neg liver ROS, GERD  Medicated,  Endo/Other  diabetes, Type 2, Oral Hypoglycemic AgentsHypothyroidism Morbid obesity (BMI 40)  Renal/GU ESRF and DialysisRenal disease (dialysis MWF)  negative genitourinary   Musculoskeletal negative musculoskeletal ROS (+)   Abdominal   Peds  Hematology  (+) Blood dyscrasia (Hgb 8.3), anemia ,   Anesthesia Other Findings EGD for GI bleed  Reproductive/Obstetrics                            Anesthesia Physical Anesthesia Plan  ASA: IV  Anesthesia Plan: MAC   Post-op Pain Management:    Induction: Intravenous  PONV Risk Score and Plan: 2 and Propofol infusion and Treatment may vary due to age or medical condition  Airway Management Planned: Natural Airway  Additional Equipment:   Intra-op Plan:   Post-operative Plan:   Informed Consent: I have reviewed the patients History and Physical, chart, labs and discussed the procedure including the risks, benefits and alternatives for the proposed anesthesia with the patient or authorized representative who has indicated his/her understanding and acceptance.   Patient has DNR.  Discussed DNR with patient and Suspend DNR.   Dental advisory given  Plan Discussed with: CRNA  Anesthesia Plan Comments:        Anesthesia Quick Evaluation

## 2019-08-28 NOTE — Progress Notes (Signed)
Pt. transferred to 6N28 by this RN. No complications during transfer, all vitals stable, w/ pt. resting comfortably in bed.

## 2019-08-28 NOTE — Op Note (Signed)
[facility] Patient Name: Roberta Bryant Procedure Date : 08/22/2019 MRN: [id number] Attending MD: Thornton Park MD, MD Date of Birth: Apr 13, 1951 CSN: [id number] Age: 69 Admit Type: Inpatient Procedure:                Upper GI endoscopy Indications:              Recent gastrointestinal bleeding Providers:                Thornton Park MD, MD, Angus Seller, Cherylynn Ridges, Technician Imagene Riches CRNA Referring MD:              Medicines:                Monitored Anesthesia Care Complications:            No immediate complications. Estimated blood loss:                            Minimal. Estimated Blood Loss:     Estimated blood loss was minimal. Procedure:                Pre-Anesthesia Assessment:                           - Prior to the procedure, a History and Physical                            was performed, and patient medications and                            allergies were reviewed. The patient's tolerance of                            previous anesthesia was also reviewed. The risks                            and benefits of the procedure and the sedation                            options and risks were discussed with the patient.                            All questions were answered, and informed consent                            was obtained. Prior Anticoagulants: The patient has                            taken no previous anticoagulant or antiplatelet                            agents. ASA Grade Assessment: III - A patient with  severe systemic disease. After reviewing the risks                            and benefits, the patient was deemed in                            satisfactory condition to undergo the procedure.                           After obtaining informed consent, the endoscope was                            passed under direct vision. Throughout the                             procedure, the patient's blood pressure, pulse, and                            oxygen saturations were monitored continuously. The                            GIF-H190 ([id number]) Olympus gastroscope was                            introduced through the mouth, and advanced to the                            third part of duodenum. The upper GI endoscopy was                            accomplished without difficulty. The patient                            tolerated the procedure well. Scope In: Scope Out: Findings:      The examined esophagus was normal.      A few small sessile polyps with no bleeding and no stigmata of recent       bleeding were found in the gastric fundus and in the gastric body. The       largest polyp was approximately 74mm. Biopsies were taken with a cold       forceps for histology. Estimated blood loss was minimal.      A few small, scattered erosions with no bleeding and no stigmata of       recent bleeding were found in the gastric antrum. Biopsies were taken       with a cold forceps for histology. Estimated blood loss was minimal.      A single 3 mm nodule was found in the duodenal bulb. Biopsies were taken       with a cold forceps for histology. Estimated blood loss was minimal.      The cardia and gastric fundus were normal on retroflexion.      The exam was otherwise without abnormality. No blood present. No       evidence for active or recent bleeding during this procedure. Impression:               -  No source for active or recent bleeding                            identified on this exam.                           - A few gastric polyps. Biopsied.                           - Mild erosive gastropathy with no bleeding and no                            stigmata of recent bleeding. Biopsied.                           - Nodule found in the duodenum. Biopsied.                           - The examination was otherwise normal. Recommendation:           - Clear  liquid diet. NPO at midnight except for                            colonoscopy prep.                           - Continue present medications.                           - Avoid all NSAIDs.                           - Await pathology results.                           - Continue serial hgb/hct with transfusion as                            indicated.                           - Prep today for colonoscopy with Dr. Henrene Pastor                            tomorrow, 08/17/2019.                           - Consider CTA or tagged bleeding scan with                            significant overt GI bleeding prior to that time. Procedure Code(s):        --- Professional ---                           [phone], Esophagogastroduodenoscopy, flexible,  transoral; with biopsy, single or multiple Diagnosis Code(s):        --- Professional ---                           K31.7, Polyp of stomach and duodenum                           K31.89, Other diseases of stomach and duodenum                           K92.2, Gastrointestinal hemorrhage, unspecified CPT copyright 2019 American [employer] Association. All rights reserved. The codes documented in this report are preliminary and upon coder review may  be revised to meet current compliance requirements. Thornton Park MD, MD 08/18/2019 12:38:18 PM This report has been signed electronically. Number of Addenda: 0

## 2019-08-28 NOTE — Interval H&P Note (Signed)
History and Physical Interval Note:  08/16/2019 7:36 AM  Roberta Bryant  has presented today for surgery, with the diagnosis of GI bleeding.  The various methods of treatment have been discussed with the patient and family. After consideration of risks, benefits and other options for treatment, the patient has consented to  Procedure(s): ESOPHAGOGASTRODUODENOSCOPY (EGD) WITH PROPOFOL (N/A) as a surgical intervention.  The patient's history has been reviewed, patient examined, no change in status, stable for surgery.  I have reviewed the patient's chart and labs.  Questions were answered to the patient's satisfaction.     Thornton Park

## 2019-08-28 NOTE — Progress Notes (Signed)
NAME:  Roberta Bryant, MRN:  [id number], DOB:  07-Jul-1950, LOS: 1 ADMISSION DATE:  08/14/2019, CONSULTATION DATE:  3/21 REFERRING MD: Dr. Francia Greaves, CHIEF COMPLAINT: Dark stools, AMS  Brief History   69 y/o female admitted with ABLA in the setting of GIB (additional recent bleeding from AVF).  DNR/DNI.   History of present illness   69 y/o F who presented to [facility] on 3/21 with reports of dark stools and altered mental status.    The patient was seen on 3/17 in the ER after she was noted to be altered staring off and not talking during HD. She reported feeling generalized weakness and missed HD on 3/15.  Only completed 50 minutes of HD on 3/17. Her Hgb at that time was 8.8. She reports she had bleeding from her fistula on Friday 3/19 that was brisk and difficult to control.   She returns to the ER on 3/21 with reports of black-red stools, weakness, abdominal pain.  EMS found her to have a BP of 90/50 and treated her with 672ml NS.  BP improved to 116/50.  She was awake/alert on arrival with SBP's remaining in the 90's.  Her last HD session was 3/19.  The patient's initial labs concerning for worsening anemia with Hgb of 5.4 (iSTAT).  She was treated with 2 units blood in the ER.  CXR was notable for bibasilar atelectasis and low lung volumes but no acute process.  She was FOBT positive on presentation with subsequent melanic stools in the ER.  GI, Nephrology consulted.    PCCM consulted for admission.    Past Medical History  COVID - positive in July 15, 2019, hospitalized x2 ESRD - secondary to DM, on HD MWF at [facility], started in 2011 HTN Hypothyroidism  CHF  Parathyroidectomy  CAD  Parkinson's Disease  GERD IBS   Significant Hospital Events   3/21 Admit   Consults:  Nephrology  GI  PCCM   Procedures:    Significant Diagnostic Tests:  EGD 3/22 >> No active bleed  Micro Data:  COVID 3/21 (hx of COVID 07/15/19) >>  Influenza A/B 3/21 >>   Antimicrobials:      Interim history/subjective:  S/p EGD and hemodialysis. Patient hemodynamically stable.  Objective   Blood pressure (!) 112/96, pulse 83, temperature 97.9 F (36.6 C), temperature source Oral, resp. rate 19, height 4\' 10"  (1.473 m), weight 86.5 kg, SpO2 100 %.        Intake/Output Summary (Last 24 hours) at 08/21/2019 1615 Last data filed at 08/24/2019 1100 Gross per 24 hour  Intake 693.97 ml  Output --  Net 693.97 ml   Filed Weights   08/12/2019 2100 08/12/2019 0500 08/28/19 1131  Weight: 86.4 kg 87.1 kg 86.5 kg   Physical Exam: General: Obese, chronically ill-appearing, no acute distress HENT: [place], AT, OP clear, MMM Eyes: EOMI, no scleral icterus Respiratory: Clear to auscultation bilaterally.  No crackles, wheezing or rales Cardiovascular: RRR, -M/R/G, no JVD GI: BS+, soft, nontender Extremities:LUE AVF with thrill/bruit,-Edema,-tenderness Neuro: AAO x4, CNII-XII grossly intact Skin: Intact, no rashes or bruising Psych: Normal mood, normal affect  Resolved Hospital Problem list     Assessment & Plan:   Hematochezia s/p EGD Acute blood loss anemia Admission Hg 5.4. S/pt vitamin K per GI. Transfused 2U PRBC with follow-up Hg >8. Remained stable EGD 3/22 with no active bleed. Plan -NPO for colonoscopy tomorrow -continue PPI  -Trend CBC -SCD's only, no anticoagulation   ESRD on HD  Followed  in [place], MWF -Appreciate Nephrology input. Dialysis today. -follow electrolytes, replace as indicated   DM II  -SSI, very sensitive scale while NPO  -hold home januvia  Hypothyroidism  -hold PO 3/21  -resume home synthroid post EGD  Hx HTN, HLD, CAD, Murmur   -Tele monitoring  -hold home coreg, fenofibrate, zetia, ASA  Anxiety / Depression  Chronic Pain  -hold home klonopin (0.5 mg QHS), PRN low dose ativan IV if needed  Moderate to Severe Malnutrition  -consider nutrition consult once PO  Best practice:  Diet: Resume diet. NPO at  midnight Pain/Anxiety/Delirium protocol (if indicated): n/a VAP protocol (if indicated): n/a  DVT prophylaxis: SCD's  GI prophylaxis: PPI  Glucose control: SSI  Mobility: BR  Code Status: Patient expresses wishes to reverse DNR status. Changed to full code. Family Communication: Updated patient bedside on 3/22 Disposition: Transfer to floor. TRH to pick up on 3/23. Dr. Sherral Hammers accepted  Labs   CBC: Recent Labs  Lab 08/23/19 1436 08/23/19 1713 08/14/2019 1247 08/10/2019 1247 08/14/2019 1321 08/12/2019 2105 08/21/2019 0344 08/27/2019 0739 08/29/2019 0905  WBC 5.0  --  5.2  --   --  5.9 5.9  --  7.0  NEUTROABS  --   --  3.4  --   --   --   --   --   --   HGB 7.8*   < > 4.9*   < > 5.4* 8.6* 8.3* 8.5* 8.6*  HCT 25.2*   < > 16.0*   < > 16.0* 26.7* 25.5* 25.0* 27.3*  MCV 130.6*  --  127.0*  --   --  100.0 98.8  --  103.8*  PLT 182  --  151  --   --  143* 134*  --  142*   < > = values in this interval not displayed.    Basic Metabolic Panel: Recent Labs  Lab 08/23/19 1436 08/23/19 1713 08/12/2019 1247 08/12/2019 1321 08/14/2019 0739  NA 134* 136 140 139 139  K 3.4* 3.3* 3.6 3.5 4.0  CL 98  --  103 101 104  CO2 22  --  24  --   --   GLUCOSE 113*  --  107* 95 88  BUN 18  --  20 24* 28*  CREATININE 6.63*  --  5.43* 5.80* 6.30*  CALCIUM 8.2*  --  7.0*  --   --    GFR: Estimated Creatinine Clearance: 7.9 mL/min (A) (by C-G formula based on SCr of 6.3 mg/dL (H)). Recent Labs  Lab 08/19/2019 1247 08/17/2019 2105 08/21/2019 0344 08/26/2019 0905  WBC 5.2 5.9 5.9 7.0  LATICACIDVEN 1.8  --   --   --     Liver Function Tests: Recent Labs  Lab 08/09/2019 1247  AST 36  ALT 17  ALKPHOS 50  BILITOT 1.4*  PROT 4.3*  ALBUMIN 1.5*   No results for input(s): LIPASE, AMYLASE in the last 168 hours. No results for input(s): AMMONIA in the last 168 hours.  ABG    Component Value Date/Time   HCO3 30.2 (H) 08/23/2019 1713   TCO2 27 08/16/2019 0739   O2SAT 43.0 08/23/2019 1713     Coagulation  Profile: Recent Labs  Lab 09/02/2019 1247  INR 2.1*    Cardiac Enzymes: No results for input(s): CKTOTAL, CKMB, CKMBINDEX, TROPONINI in the last 168 hours.  HbA1C: Hgb A1c MFr Bld  Date/Time Value Ref Range Status  09/06/2019 09:05 PM 4.9 4.8 - 5.6 % Final    Comment:    (  NOTE) Pre diabetes:          5.7%-6.4% Diabetes:              >6.4% Glycemic control for   <7.0% adults with diabetes   12/14/2017 01:04 PM 4.5 (L) 4.8 - 5.6 % Final    Comment:    (NOTE)         Prediabetes: 5.7 - 6.4         Diabetes: >6.4         Glycemic control for adults with diabetes: <7.0     CBG: Recent Labs  Lab 08/23/2019 1244 08/18/2019 2046 08/24/2019 2344 08/27/2019 0359 08/26/2019 0848  GLUCAP 97 88 85 89 87   Rodman Pickle, M.D. [facility] Pulmonary/Critical Care Medicine 08/27/2019 4:15 PM

## 2019-08-28 NOTE — Transfer of Care (Signed)
Immediate Anesthesia Transfer of Care Note  Patient: KRISS PERLEBERG  Procedure(s) Performed: ESOPHAGOGASTRODUODENOSCOPY (EGD) WITH PROPOFOL (N/A ) BIOPSY  Patient Location: Endoscopy Unit  Anesthesia Type:MAC  Level of Consciousness: drowsy  Airway & Oxygen Therapy: Patient Spontanous Breathing and Patient connected to nasal cannula oxygen  Post-op Assessment: Report given to RN and Post -op Vital signs reviewed and stable  Post vital signs: Reviewed and stable  Last Vitals:  Vitals Value Taken Time  BP 92/40 08/07/2019 0818  Temp    Pulse 78 08/07/2019 0819  Resp 24 08/10/2019 0819  SpO2 100 % 08/23/2019 0819  Vitals shown include unvalidated device data.  Last Pain:  Vitals:   09/06/2019 0704  TempSrc: Oral  PainSc: 0-No pain         Complications: No apparent anesthesia complications

## 2019-08-28 NOTE — Progress Notes (Signed)
CSW received consult for patient due to her being active with [facility] for OT, PT, Social Work, and Therapist, sports. CSW spoke with Cassie at [facility] to confirm services. Patient receives HD treatments at [facility] on a MWF schedule.   Madilyn Fireman, MSW, LCSW-A Transitions of Care  Clinical Social Worker  [facility] Emergency Departments  Medical ICU [phone]

## 2019-08-28 NOTE — Anesthesia Procedure Notes (Signed)
Procedure Name: MAC Date/Time: 08/20/2019 7:59 AM Performed by: Imagene Riches, CRNA Pre-anesthesia Checklist: Patient identified, Emergency Drugs available, Suction available, Patient being monitored and Timeout performed Patient Re-evaluated:Patient Re-evaluated prior to induction Oxygen Delivery Method: Nasal cannula

## 2019-08-28 NOTE — Consult Note (Signed)
[facility] Renal Consultation Note    Indication for Consultation:  Management of ESRD/hemodialysis; anemia, hypertension/volume and secondary hyperparathyroidism PCP:  HPI: Roberta Bryant is a 69 y.o. female with ESRD on MWF dialysis in [place] for 10 years.  PMHx is significant for HTN, hypothyroidism, CAD, hx parathyroidectomy, GERD, parkinson's disease, prior COVID (07/2019) who was admitted with ABLA in the setting of GIB (dark stools- heme +) and recent bleeding from AVF.  Also of note was she had been since in the ED last week from her dialysis unit with an episode of altered mental status.and was discharged home. Recent outpatient hgb had been 9 3/5, 7.9 3/10, 8.1 3/17 with an acute drop to 4.9  upon arrival to 3/21 with lowish BP She has been transfused 1 or 2 ordered units PRBC is recorded . CXR was un remarkable. hgb this am is 9.6. Last outpatient post wt was 85.7 with a net gain of 1.2 Patient has been reporting weakness and fatigue for the last several weeks at dialysis, but had a new complaint of dizziness 3/19.  She continues to feel bad and feels cold, but denies pain or SOB.  Past Medical History:  Diagnosis Date  . Anemia   . Aortic stenosis   . CHF (congestive heart failure) ([place])   . Childhood asthma   . Chronic upper back pain   . Coronary artery disease    Stent to left main coronary artery second of August 2017  . Decreased hearing   . ESRD (end stage renal disease) on dialysis [facility])    "MWF; [facility] in [place]" (01/06/2018)  . Gait abnormality 08/28/2016  . GERD (gastroesophageal reflux disease)   . Headache    "a few times/week" (10/30/2014)  . Headache syndrome 01/08/2017  . Heart murmur   . History of blood transfusion 1996   "related to menses"  . Hyperlipidemia   . Hypertension   . Hypothyroidism   . IBS (irritable bowel syndrome)   . Myocardial infarction ([place]) 2004  . Osteoarthritis   . Pneumonia    "years ago"  . Rotator cuff  arthropathy of right shoulder   . Spondylosis of cervical spine 06/16/2017  . Stroke ([place]) 07/2016   mini stroke , right side of body weak-   . Type II diabetes mellitus ([place])    Past Surgical History:  Procedure Laterality Date  . A/V FISTULAGRAM Left 05/31/2018   Procedure: A/V FISTULAGRAM;  Surgeon: Serafina Mitchell, MD;  Location: [place] CV LAB;  Service: Cardiovascular;  Laterality: Left;  . APPENDECTOMY    . AV FISTULA PLACEMENT Left 12/03/2006   "forearm"  . AV FISTULA REPAIR Left 11/25/2009   "forearm"  . CARPAL TUNNEL RELEASE Right 02/11/2017   Procedure: RIGHT CARPAL TUNNEL RELEASE;  Surgeon: Daryll Brod, MD;  Location: [place];  Service: Orthopedics;  Laterality: Right;  . CHOLECYSTECTOMY OPEN  1970's  . COLONOSCOPY W/ POLYPECTOMY  07/30/2010   Minimal sigmoid diverticulosis. Small internal hemorrhoids. Otherwise normal colonoscopy to TI.   Marland Kitchen CORONARY ANGIOPLASTY    . CORONARY ANGIOPLASTY WITH STENT PLACEMENT    . ESOPHAGOGASTRODUODENOSCOPY  10/21/2012   Gastric polyps- status post polypectomy. Mild gastritis.   . INSERTION OF DIALYSIS CATHETER Right 2010   "chest"  . PARATHYROIDECTOMY  2010?   Parathyroid autotransplantation   . REVERSE SHOULDER ARTHROPLASTY Right 01/06/2018  . REVERSE SHOULDER ARTHROPLASTY Right 01/06/2018   Procedure: RIGHT REVERSE SHOULDER ARTHROPLASTY;  Surgeon: [name], MD;  Location:  [place] OR;  Service: Orthopedics;  Laterality: Right;  . REVISON OF ARTERIOVENOUS FISTULA Left 12/04/2013   Procedure: REVISON OF ARTERIOVENOUS FISTULA;  Surgeon: Rosetta Posner, MD;  Location: [place];  Service: Vascular;  Laterality: Left;  . REVISON OF ARTERIOVENOUS FISTULA Left 06/08/9145   Procedure: PLICATION OF LEFT BRACHIOCEPHALIC  ARTERIOVENOUS FISTULA;  Surgeon: Conrad Shippingport, MD;  Location: [place];  Service: Vascular;  Laterality: Left;  . SHOULDER ARTHROSCOPY WITH ROTATOR CUFF REPAIR AND SUBACROMIAL DECOMPRESSION Right 06/22/2017    Procedure: RIGHT SHOULDER ARTHROSCOPY WITH EXTENSIVE DEBRIDEMENT;  Surgeon: Mcarthur Rossetti, MD;  Location: [place];  Service: Orthopedics;  Laterality: Right;  . TRIGGER FINGER RELEASE Right 10/22/2017   Procedure: RELEASE RIGHT INDEX TRIGGER FINGER/A-1 PULLEY;  Surgeon: Daryll Brod, MD;  Location: [place];  Service: Orthopedics;  Laterality: Right;  . TUBAL LIGATION  1989  . UMBILICAL HERNIA REPAIR  1990's X 2   Family History  Problem Relation Age of Onset  . Epilepsy Mother   . Diabetes Mother   . Cancer Mother   . Alcohol abuse Father    Social History:  reports that she has quit smoking. Her smoking use included cigarettes. She quit after 3.00 years of use. She has never used smokeless tobacco. She reports previous drug use. Drugs: Marijuana and Hydromorphone. She reports that she does not drink alcohol. Allergies  Allergen Reactions  . Sulfa Antibiotics Anaphylaxis  . Amoxicillin Hives    Has patient had a PCN reaction causing immediate rash, facial/tongue/throat swelling, SOB or lightheadedness with hypotension: No Has patient had a PCN reaction causing severe rash involving mucus membranes or skin necrosis: No Has patient had a PCN reaction that required hospitalization: No Has patient had a PCN reaction occurring within the last 10 years: Unknown If all of the above answers are "NO", then may proceed with Cephalosporin use.   Marland Kitchen Crestor [Rosuvastatin Calcium] Other (See Comments)    Dizziness, couldn't walk  . Ibuprofen Hives  . Naldecon Senior [employer]  . Statins Hives  . Chlorphen-Phenyleph-Asa Rash  . Gabapentin Other (See Comments)    Dizzy   Prior to Admission medications   Medication Sig Start Date End Date Taking? Authorizing Provider  acetaminophen (TYLENOL) 500 MG tablet Take 500-1,000 mg by mouth every 6 (six) hours as needed (FOR PAIN.).   Yes [provider]  aspirin EC 81 MG tablet Take 81 mg by mouth daily.    Yes [provider]  carvedilol (COREG) 6.25 MG tablet Take 6.25 mg by mouth 2 (two) times daily with a meal.   Yes [provider]  clonazePAM (KLONOPIN) 0.5 MG tablet TAKE 1 TABLET BY MOUTH AT BEDTIME Patient taking differently: Take 0.5 mg by mouth at bedtime.  05/15/19  Yes Kathrynn Ducking, MD  ezetimibe (ZETIA) 10 MG tablet Take 10 mg by mouth every evening.  05/10/16  Yes [provider]  fenofibrate 160 MG tablet Take 160 mg by mouth every evening.  03/08/17  Yes [provider]  folic acid-vitamin b complex-vitamin c-selenium-zinc (DIALYVITE) 3 MG TABS tablet Take 1 tablet by mouth daily.   Yes [provider]  Lanthanum Carbonate (FOSRENOL) 1000 MG PACK Take 2,000 mg by mouth 3 (three) times daily.    Yes [provider]  levothyroxine (SYNTHROID, LEVOTHROID) 50 MCG tablet Take 50 mcg by mouth daily before breakfast.    Yes [provider]  lidocaine-prilocaine (EMLA) cream Apply 1 application topically See admin instructions. Apply  a small amount to access site 1-2 hours as needed before dialysis--cover with occlusive dressing 09/24/17  Yes [provider]  loperamide (IMODIUM A-D) 2 MG tablet Take 2-4 mg by mouth every Monday, Wednesday, and Friday with hemodialysis.    Yes [provider]  nortriptyline (PAMELOR) 10 MG capsule Take 2 capsules (20 mg total) by mouth at bedtime. 08/14/19  Yes Kathrynn Ducking, MD  omeprazole (PRILOSEC) 20 MG capsule Take 20 mg by mouth in the morning and at bedtime.   Yes [provider]  sitaGLIPtin (JANUVIA) 25 MG tablet Take 25 mg by mouth daily.   Yes [provider]  Vitamin D, Ergocalciferol, (DRISDOL) 1.25 MG (50000 UT) CAPS capsule Take 50,000 Units by mouth every 30 (thirty) days. 04/22/19  Yes [provider]  HYDROcodone-acetaminophen (NORCO/VICODIN) 5-325 MG tablet Take 1-2 tablets by mouth every 6 (six) hours as needed for severe pain. Patient not taking:  Reported on 08/23/2019 03/06/19   Joy, Shawn C, PA-C  ondansetron (ZOFRAN ODT) 4 MG disintegrating tablet Take 1 tablet (4 mg total) by mouth every 8 (eight) hours as needed for nausea or vomiting. Patient not taking: Reported on 05/30/2019 05/18/19   Jackquline Denmark, MD  pantoprazole (PROTONIX) 40 MG tablet Take 1 tablet (40 mg total) by mouth 2 (two) times daily. Patient not taking: Reported on 08/23/2019 05/18/19   Jackquline Denmark, MD   Current Facility-Administered Medications  Medication Dose Route Frequency Provider Last Rate Last Admin  . 0.9 %  sodium chloride infusion   Intravenous Continuous [place], MD 20 mL/hr at 08/24/2019 1001 New Bag at 08/15/2019 1001  . acetaminophen (TYLENOL) tablet 650 mg  650 mg Oral Q4H PRN Ollis, Brandi L, NP      . bisacodyl (DULCOLAX) EC tablet 20 mg  20 mg Oral Once Gribbin, Sarah J, PA-C      . calcitRIOL (ROCALTROL) capsule 1 mcg  1 mcg Oral Q M,W,F-HD Penninger, Ria Comment, [place]      . Chlorhexidine Gluconate Cloth 2 % PADS 6 each  6 each Topical Q0600 Penninger, [place], PA      . insulin aspart (novoLOG) injection 0-6 Units  0-6 Units Subcutaneous Q4H Ollis, Brandi L, NP      . metoCLOPramide (REGLAN) injection 10 mg  10 mg Intravenous Once Vena Rua, PA-C       Followed by  . metoCLOPramide (REGLAN) injection 10 mg  10 mg Intravenous Once Gribbin, Sarah J, PA-C      . ondansetron [facility]) injection 4 mg  4 mg Intravenous Q6H PRN Donita Brooks, NP      . Derrill Memo ON 08/20/2019] pantoprazole (PROTONIX) EC tablet 40 mg  40 mg Oral Q0600 Vena Rua, PA-C      . peg 3350 powder (MOVIPREP) kit 200 g  1 kit Oral Once Vena Rua, PA-C      . phytonadione (VITAMIN K) tablet 10 mg  10 mg Oral Daily Jerene Bears, MD       Labs: Basic Metabolic Panel: Recent Labs  Lab 08/23/19 1436 08/23/19 1713 09/04/2019 1247 08/23/2019 1321 08/25/2019 0739  NA 134*   < > 140 139 139  K 3.4*   < > 3.6 3.5 4.0  CL 98   < > 103 101 104  CO2 22  --  24  --    --   GLUCOSE 113*   < > 107* 95 88  BUN 18   < > 20 24* 28*  CREATININE 6.63*   < > 5.43* 5.80* 6.30*  CALCIUM 8.2*  --  7.0*  --   --    < > = values in this interval not displayed.   Liver Function Tests: Recent Labs  Lab 08/11/2019 1247  AST 36  ALT 17  ALKPHOS 50  BILITOT 1.4*  PROT 4.3*  ALBUMIN 1.5*   No results for input(s): LIPASE, AMYLASE in the last 168 hours. No results for input(s): AMMONIA in the last 168 hours. CBC: Recent Labs  Lab 08/23/19 1436 08/23/19 1713 08/25/2019 1247 08/20/2019 1321 08/10/2019 2105 08/30/2019 2105 08/15/2019 0344 08/22/2019 0739 08/17/2019 0905  WBC 5.0   < > 5.2   < > 5.9  --  5.9  --  7.0  NEUTROABS  --   --  3.4  --   --   --   --   --   --   HGB 7.8*   < > 4.9*   < > 8.6*   < > 8.3* 8.5* 8.6*  HCT 25.2*   < > 16.0*   < > 26.7*   < > 25.5* 25.0* 27.3*  MCV 130.6*  --  127.0*  --  100.0  --  98.8  --  103.8*  PLT 182   < > 151   < > 143*  --  134*  --  142*   < > = values in this interval not displayed.   Cardiac Enzymes: No results for input(s): CKTOTAL, CKMB, CKMBINDEX, TROPONINI in the last 168 hours. CBG: Recent Labs  Lab 09/04/2019 1244 08/11/2019 2046 09/04/2019 2344 08/08/2019 0359 08/20/2019 0848  GLUCAP 97 88 85 89 87   Iron Studies: No results for input(s): IRON, TIBC, TRANSFERRIN, FERRITIN in the last 72 hours. Studies/Results: DG Chest Port 1 View  Result Date: 08/22/2019 CLINICAL DATA:  GI bleeding. EXAM: PORTABLE CHEST 1 VIEW COMPARISON:  Yesterday FINDINGS: Low volume chest with interstitial crowding. Normal heart size for technique. Stable upper mediastinal contours. Postoperative thoracic inlet. Right glenohumeral arthroplasty. IMPRESSION: Stable low volume chest without acute finding. Electronically Signed   By: Monte Fantasia M.D.   On: 09/04/2019 07:51   DG Chest Port 1 View  Result Date: 08/08/2019 CLINICAL DATA:  Pt here from home for black stools with red streaks, weakness, pale, abdominal pain, and hypotension.  Dialysis patient. EXAM: PORTABLE CHEST 1 VIEW COMPARISON:  Chest radiograph 08/09/2019 FINDINGS: Stable cardiomediastinal contours. Low lung volumes. Scattered bibasilar opacities likely reflecting atelectasis. No new focal consolidation. No pneumothorax or significant pleural effusion. Status post right shoulder arthroplasty. IMPRESSION: Low lung volumes with bibasilar atelectasis. Electronically Signed   By: Audie Pinto M.D.   On: 08/25/2019 13:31    ROS: As per HPI otherwise negative.  Physical Exam: Vitals:   08/30/2019 1000 08/09/2019 1131 08/10/2019 1144 08/31/2019 1145  BP: (!) 117/56 120/60 126/60 123/64  Pulse: 73 80 78 76  Resp: 16 19    Temp:  97.9 F (36.6 C)    TempSrc:  Oral    SpO2: 100% 100%    Weight:  86.5 kg    Height:         General: chronically ill appearing female -NAD on room air on dialysis Head: NCAT MMM Neck: Supple.  Lungs: CTA bilaterally without wheezes, rales, or rhonchi. Breathing is unlabored. Heart: RRR with S1 S2.  Abdomen: soft NT + BS Lower extremities:without edema or ischemic changes, no open wounds  Neuro: A & O  X 3. Moves all  extremities spontaneously. Psych:  Responds to questions appropriately with a depressed affect. Dialysis Access: left upper AVF Qb 400  Dialysis Orders: [facility] MWF 3.5 hr EDW 85 400/A 1.5 3 K 2.25 Ca 36 degrees profile 4 var Na 148 linear left upper  AVF heparin 4000 with 1500 mid tmt Mircera 100 q 2 wks -last had 60 on 3/10 calcitriol 1 Binders: fosrenol 2 gm ac  Assessment/Plan: 1. ABLA - likely due to lower GIB - - BUN not increased - holding heparin - s/p transfusion; GI following 2. ESRD -  MWF - on HD tolerating well - recent bleeding issues with access - no heparin HD today - currently AP/VP are wnl while on HD with Qb 400 3. Hypertension/volume  - BP better today - goal 1.5 - keep SBP > 100 - take out of UF if BP drops 4. Anemia  - due for redose ESA 3/24- Fe studies ordered though s/p transfusion 5. Metabolic bone  disease -  Add binders when eating - on Calcitriol for Ca support I think since hx parathyroidectomy; corrected Ca ok since alb is so low 6. Nutrition - alb severely low 1.5  - added prostat- advance diet as able - current on CL 7. Anxeity/depression 8. CAD 9. DM/hypothyroidism - per primary 10.  DNR  Myriam Jacobson, PA-C [place] [phone] 08/22/2019, 12:19 PM

## 2019-08-29 ENCOUNTER — Inpatient Hospital Stay (HOSPITAL_COMMUNITY): Payer: Medicare Other | Admitting: Anesthesiology

## 2019-08-29 ENCOUNTER — Encounter (HOSPITAL_COMMUNITY): Admission: EM | Disposition: E | Payer: Self-pay | Source: Home / Self Care | Attending: Internal Medicine

## 2019-08-29 ENCOUNTER — Encounter (HOSPITAL_COMMUNITY): Admission: EM | Disposition: E | Payer: Medicare Other | Source: Home / Self Care | Attending: Internal Medicine

## 2019-08-29 ENCOUNTER — Encounter (HOSPITAL_COMMUNITY): Payer: Self-pay | Admitting: Internal Medicine

## 2019-08-29 DIAGNOSIS — K635 Polyp of colon: Secondary | ICD-10-CM

## 2019-08-29 DIAGNOSIS — K5731 Diverticulosis of large intestine without perforation or abscess with bleeding: Secondary | ICD-10-CM

## 2019-08-29 DIAGNOSIS — D122 Benign neoplasm of ascending colon: Secondary | ICD-10-CM

## 2019-08-29 DIAGNOSIS — K626 Ulcer of anus and rectum: Secondary | ICD-10-CM

## 2019-08-29 DIAGNOSIS — K921 Melena: Secondary | ICD-10-CM

## 2019-08-29 HISTORY — PX: POLYPECTOMY: SHX5525

## 2019-08-29 HISTORY — PX: COLONOSCOPY WITH PROPOFOL: SHX5780

## 2019-08-29 LAB — GLUCOSE, CAPILLARY
Glucose-Capillary: 100 mg/dL — ABNORMAL HIGH (ref 70–99)
Glucose-Capillary: 88 mg/dL (ref 70–99)
Glucose-Capillary: 81 mg/dL (ref 70–99)
Glucose-Capillary: 104 mg/dL — ABNORMAL HIGH (ref 70–99)
Glucose-Capillary: 87 mg/dL (ref 70–99)

## 2019-08-29 LAB — CBC
HCT: 24.4 % — ABNORMAL LOW (ref 36.0–46.0)
Hemoglobin: 7.8 g/dL — ABNORMAL LOW (ref 12.0–15.0)
MCH: 32.6 pg (ref 26.0–34.0)
MCHC: 32 g/dL (ref 30.0–36.0)
MCV: 102.1 fL — ABNORMAL HIGH (ref 80.0–100.0)
Platelets: 153 10*3/uL (ref 150–400)
RBC: 2.39 MIL/uL — ABNORMAL LOW (ref 3.87–5.11)
RDW: 28 % — ABNORMAL HIGH (ref 11.5–15.5)
WBC: 5.6 10*3/uL (ref 4.0–10.5)
nRBC: 0 % (ref 0.0–0.2)

## 2019-08-29 LAB — PROTIME-INR
INR: 1.5 — ABNORMAL HIGH (ref 0.8–1.2)
Prothrombin Time: 18.4 seconds — ABNORMAL HIGH (ref 11.4–15.2)

## 2019-08-29 LAB — SURGICAL PATHOLOGY

## 2019-08-29 SURGERY — COLONOSCOPY WITH PROPOFOL
Anesthesia: Monitor Anesthesia Care

## 2019-08-29 MED ORDER — SODIUM CHLORIDE 0.9 % IV SOLN: INTRAVENOUS | Status: DC | PRN

## 2019-08-29 MED ORDER — PROPOFOL 10 MG/ML IV BOLUS
INTRAVENOUS | Status: DC | PRN
  Administered 2019-08-29: 20 mg via INTRAVENOUS

## 2019-08-29 MED ORDER — PHENYLEPHRINE HCL (PRESSORS) 10 MG/ML IV SOLN
INTRAVENOUS | Status: DC | PRN
  Administered 2019-08-29: 120 ug via INTRAVENOUS
  Administered 2019-08-29: 80 ug via INTRAVENOUS

## 2019-08-29 MED ORDER — CHLORHEXIDINE GLUCONATE CLOTH 2 % EX PADS
6.0000 | MEDICATED_PAD | Freq: Every day | CUTANEOUS | Status: DC
  Administered 2019-08-29 – 2019-08-31 (×3): 6 via TOPICAL

## 2019-08-29 MED ORDER — PROPOFOL 500 MG/50ML IV EMUL
INTRAVENOUS | Status: DC | PRN
  Administered 2019-08-29: 100 ug/kg/min via INTRAVENOUS

## 2019-08-29 MED ORDER — INSULIN ASPART 100 UNIT/ML ~~LOC~~ SOLN: 0.0000 [IU] | Freq: Three times a day (TID) | SUBCUTANEOUS | Status: DC

## 2019-08-29 MED ORDER — FLEET ENEMA 7-19 GM/118ML RE ENEM: 1.0000 | ENEMA | Freq: Once | RECTAL | Status: DC

## 2019-08-29 MED ORDER — NEPRO/CARBSTEADY PO LIQD
237.0000 mL | Freq: Two times a day (BID) | ORAL | Status: DC
  Administered 2019-09-01 – 2019-09-06 (×4): 237 mL via ORAL

## 2019-08-29 SURGICAL SUPPLY — 22 items

## 2019-08-29 NOTE — Op Note (Signed)
[facility] Patient Name: Roberta Bryant Procedure Date : 08/22/2019 MRN: [id number] Attending MD: Docia Chuck. Henrene Pastor , MD Date of Birth: 06-Apr-1951 CSN: [id number] Age: 69 Admit Type: Inpatient Procedure:                Colonoscopy with cold snare polypectomy x 6. Indications:              Hematochezia. Negative EGD yesterday Providers:                Docia Chuck. Henrene Pastor, MD, Josie Dixon, RN, Cletis Athens, Technician, Rejeana Brock, CRNA Referring MD:             [facility] hospitalist Medicines:                Monitored Anesthesia Care Complications:            No immediate complications. Estimated blood loss:                            None. Estimated Blood Loss:     Estimated blood loss: none. Procedure:                Pre-Anesthesia Assessment:                           - Prior to the procedure, a History and Physical                            was performed, and patient medications and                            allergies were reviewed. The patient's tolerance of                            previous anesthesia was also reviewed. The risks                            and benefits of the procedure and the sedation                            options and risks were discussed with the patient.                            All questions were answered, and informed consent                            was obtained. Prior Anticoagulants: The patient has                            taken no previous anticoagulant or antiplatelet                            agents. ASA Grade Assessment: IV - A patient with  severe systemic disease that is a constant threat                            to life. After reviewing the risks and benefits,                            the patient was deemed in satisfactory condition to                            undergo the procedure.                           After obtaining informed consent, the colonoscope                   was passed under direct vision. Throughout the                            procedure, the patient's blood pressure, pulse, and                            oxygen saturations were monitored continuously. The                            [id number]-HQ190L ([id number] Olympus colonoscope was                            introduced through the anus and advanced to the the                            cecum, identified by appendiceal orifice and                            ileocecal valve. The terminal ileum, ileocecal                            valve, appendiceal orifice, and rectum were                            photographed. The quality of the bowel preparation                            was good. The colonoscopy was performed without                            difficulty. The patient tolerated the procedure                            well. The bowel preparation used was SUPREP via                            split dose instruction. Scope In: 9:43:24 AM Scope Out: 10:00:55 AM Scope Withdrawal Time: 0 hours 11 minutes 9 seconds  Total Procedure Duration: 0 hours 17 minutes 31 seconds  Findings:      The  terminal ileum appeared normal (bile-stained).      Six polyps were found in the descending colon, transverse colon,       ascending colon and cecum. The polyps were 2 to 4 mm in size. These       polyps were removed with a cold snare. Resection and retrieval were       complete.      Multiple diverticula were found in the left colon and associated with       blood. This cleared with irrigation. No active bleeding.      There was a solitary rectal ulcer which was linear shaped. No active       bleeding or stigmata the exam was otherwise without abnormality on       direct and retroflexion views. Impression:               - The examined portion of the ileum was normal.                           - Six 2 to 4 mm polyps in the descending colon, in                            the transverse colon,  in the ascending colon and in                            the cecum, removed with a cold snare. Resected and                            retrieved.                           - Diverticulosis in the left colon. This is the                            cause of GI bleeding. Blood cleared with vigorous                            irrigation without active bleeding at procedures                            end.                           - Solitary rectal ulcer without stigmata. The                            examination was otherwise normal on direct and                            retroflexion views. Recommendation:           1. Monitor for evidence of rebleeding                           2. If significant rebleeding consult interventional  radiology to consider angiography for bleeding                            left-sided diverticular lesion.                           3. Transfuse as needed                           4. Recheck PT/INR. This was elevated on admission                           5. Renal diet.                           6. Await pathology results.                           7. We will continue to follow Procedure Code(s):        --- Professional ---                           [phone], Colonoscopy, flexible; with removal of                            tumor(s), polyp(s), or other lesion(s) by snare                            technique Diagnosis Code(s):        --- Professional ---                           K63.5, Polyp of colon                           K92.1, Melena (includes Hematochezia)                           K57.30, Diverticulosis of large intestine without                            perforation or abscess without bleeding CPT copyright 2019 American Medical Association. All rights reserved. The codes documented in this report are preliminary and upon coder review may  be revised to meet current compliance requirements. Docia Chuck. Henrene Pastor, MD 08/30/2019  10:12:39 AM This report has been signed electronically. Number of Addenda: 0

## 2019-08-29 NOTE — Anesthesia Preprocedure Evaluation (Signed)
Anesthesia Evaluation  Patient identified by MRN, date of birth, ID band Patient awake    Reviewed: Allergy & Precautions, NPO status , Patient's Chart, lab work & pertinent test results, reviewed documented beta blocker date and time   Airway Mallampati: III  TM Distance: >3 FB Neck ROM: Full  Mouth opening: Limited Mouth Opening  Dental   Pulmonary asthma , former smoker,    Pulmonary exam normal        Cardiovascular hypertension, Pt. on medications and Pt. on home beta blockers + CAD, + Past MI, + Cardiac Stents (Left main 2017) and +CHF  Normal cardiovascular exam+ Valvular Problems/Murmurs AS      Neuro/Psych  Headaches,  Neuromuscular disease (h/o Parkinson's) CVA (right sided weakness), Residual Symptoms negative psych ROS   GI/Hepatic Neg liver ROS, GERD  Medicated,GI bleed   Endo/Other  diabetes, Type 2, Oral Hypoglycemic AgentsHypothyroidism Morbid obesity (BMI 40)  Renal/GU ESRF and DialysisRenal disease (dialysis MWF)  negative genitourinary   Musculoskeletal negative musculoskeletal ROS (+)   Abdominal   Peds  Hematology  (+) Blood dyscrasia (Hgb 8.3), anemia ,   Anesthesia Other Findings  Echo 12/2017: moderate AS, mild AR, EF 60-65%  Myoview 09/20/17: EF 45-54%, no ischemia, low risk study  Reproductive/Obstetrics                             Anesthesia Physical  Anesthesia Plan  ASA: IV  Anesthesia Plan: MAC   Post-op Pain Management:    Induction: Intravenous  PONV Risk Score and Plan: 2 and Propofol infusion and Treatment may vary due to age or medical condition  Airway Management Planned: Natural Airway and Nasal Cannula  Additional Equipment: None  Intra-op Plan:   Post-operative Plan:   Informed Consent: I have reviewed the patients History and Physical, chart, labs and discussed the procedure including the risks, benefits and alternatives for the proposed  anesthesia with the patient or authorized representative who has indicated his/her understanding and acceptance.   Patient has DNR.  Discussed DNR with patient and Suspend DNR.     Plan Discussed with:   Anesthesia Plan Comments:         Anesthesia Quick Evaluation

## 2019-08-29 NOTE — Evaluation (Signed)
Physical Therapy Evaluation Patient Details Name: Roberta Bryant MRN: [id number] DOB: 11-Jan-1951 Today's Date: 08/17/2019   History of Present Illness  Pt is a 69 y/o female admitted secondary to acute blood loss anemia, likely from GI source. Pt is s/p endoscopy and s/p colonoscopy with cold snare polypectomy X6. PMH includes ESRD on HD MWF and GERD.   Clinical Impression  Pt admitted secondary to problem above with deficits below. Pt requiring total A +2 to stand at EOB. Pt reports increased difficulty with transfers and was requiring +2 assist from sons. Feel pt will benefit from SNF level therapies to increase independence and safety. Will continue to follow acutely.     Follow Up Recommendations SNF;Supervision/Assistance - 24 hour    Equipment Recommendations  Hospital bed;Other (comment)(hoyer lift, hoyer lift pad)    Recommendations for Other Services       Precautions / Restrictions Precautions Precautions: Fall Restrictions Weight Bearing Restrictions: No      Mobility  Bed Mobility Overal bed mobility: Needs Assistance Bed Mobility: Supine to Sit;Sit to Supine     Supine to sit: Total assist;+2 for physical assistance Sit to supine: Total assist;+2 for physical assistance   General bed mobility comments: total A +2 for LE and trunk assist. Increased time to come to sitting. Difficulty sequencing noted, and required multimodal cues.   Transfers Overall transfer level: Needs assistance Equipment used: 2 person hand held assist Transfers: Sit to/from Stand Sit to Stand: Total assist;+2 physical assistance         General transfer comment: Pt unable to stand fully upright. Was able to clear hips with total A +2.   Ambulation/Gait                Stairs            Wheelchair Mobility    Modified Rankin (Stroke Patients Only)       Balance Overall balance assessment: Needs assistance Sitting-balance support: No upper extremity  supported;Feet supported Sitting balance-Leahy Scale: Fair     Standing balance support: Bilateral upper extremity supported;During functional activity Standing balance-Leahy Scale: Zero Standing balance comment: total A +2 to stand                             Pertinent Vitals/Pain Pain Assessment: No/denies pain    Home Living Family/patient expects to be discharged to:: Private residence Living Arrangements: Children Available Help at Discharge: Family;Available PRN/intermittently Type of Home: House Home Access: Ramped entrance     Home Layout: One level Home Equipment: Walker - standard;Wheelchair - [employer];Shower seat - built in;Grab bars - tub/shower      Prior Function Level of Independence: Needs assistance   Gait / Transfers Assistance Needed: Reports sons had to help with transfers to and from [facility]   ADL's / Homemaking Assistance Needed: tota A for ADLs        Hand Dominance        Extremity/Trunk Assessment   Upper Extremity Assessment Upper Extremity Assessment: Defer to OT evaluation(weakness noted bilaterally )    Lower Extremity Assessment Lower Extremity Assessment: Generalized weakness    Cervical / Trunk Assessment Cervical / Trunk Assessment: Kyphotic  Communication   Communication: HOH  Cognition Arousal/Alertness: Awake/alert Behavior During Therapy: WFL for tasks assessed/performed Overall Cognitive Status: No family/caregiver present to determine baseline cognitive functioning  General Comments: Tagential at times. Difficulty with processing noted as well.        General Comments      Exercises     Assessment/Plan    PT Assessment Patient needs continued PT services  PT Problem List Decreased strength;Decreased balance;Decreased mobility;Decreased knowledge of use of DME;Decreased cognition;Decreased safety awareness;Decreased knowledge of precautions        PT Treatment Interventions DME instruction;Balance training;Gait training;Neuromuscular re-education;Stair training;Cognitive remediation;Functional mobility training;Patient/family education;Therapeutic activities;Therapeutic exercise;Wheelchair mobility training    PT Goals (Current goals can be found in the Care Plan section)  Acute Rehab PT Goals Patient Stated Goal: to go to rehab before going home  PT Goal Formulation: With patient Time For Goal Achievement: 09/12/19 Potential to Achieve Goals: Fair    Frequency Min 2X/week   Barriers to discharge        Co-evaluation               AM-PAC PT "6 Clicks" Mobility  Outcome Measure Help needed turning from your back to your side while in a flat bed without using bedrails?: Total Help needed moving from lying on your back to sitting on the side of a flat bed without using bedrails?: Total Help needed moving to and from a bed to a chair (including a wheelchair)?: Total Help needed standing up from a chair using your arms (e.g., wheelchair or bedside chair)?: Total Help needed to walk in hospital room?: Total Help needed climbing 3-5 steps with a railing? : Total 6 Click Score: 6    End of Session Equipment Utilized During Treatment: Gait belt Activity Tolerance: Patient tolerated treatment well Patient left: in bed;with call bell/phone within reach;with bed alarm set Nurse Communication: Mobility status;Other (comment)(pt requesting feeder) PT Visit Diagnosis: Unsteadiness on feet (R26.81);Muscle weakness (generalized) (M62.81);Difficulty in walking, not elsewhere classified (R26.2)    Time: 6160-7371 PT Time Calculation (min) (ACUTE ONLY): 30 min   Charges:   PT Evaluation $PT Eval Moderate Complexity: 1 Mod PT Treatments $Therapeutic Activity: 8-22 mins        Lou Miner, DPT  Acute Rehabilitation Services  Pager: [phone] Office: [phone]   Rudean Hitt 08/21/2019, 3:10  PM

## 2019-08-29 NOTE — Anesthesia Postprocedure Evaluation (Signed)
Anesthesia Post Note  Patient: MARLEI GLOMSKI  Procedure(s) Performed: ESOPHAGOGASTRODUODENOSCOPY (EGD) WITH PROPOFOL (N/A ) BIOPSY     Patient location during evaluation: Endoscopy Anesthesia Type: MAC Level of consciousness: awake and alert Pain management: pain level controlled Vital Signs Assessment: post-procedure vital signs reviewed and stable Respiratory status: spontaneous breathing, nonlabored ventilation, respiratory function stable and patient connected to nasal cannula oxygen Cardiovascular status: blood pressure returned to baseline and stable Postop Assessment: no apparent nausea or vomiting Anesthetic complications: no    Last Vitals:  Vitals:   08/10/2019 1025 09/06/2019 1103  BP: (!) 156/71 (!) 146/75  Pulse:  92  Resp: (!) 25 20  Temp:  36.4 C  SpO2: 100% 100%    Last Pain:  Vitals:   08/23/2019 1103  TempSrc: Oral  PainSc:                  Ransom Nickson [place]

## 2019-08-29 NOTE — Social Work (Signed)
Tx from 68M, consult for SNF.  Pt active w/ [facility] for OT, PT, Social Work, and Therapist, sports. CSW spoke with Cassie at [facility] to confirm services. Patient receives HD treatments at [facility] on a MWF schedule.  Have spoken with [place], PT who will alert this Probation officer of recommendations when assessment complete.   Westley Hummer, MSW, [place] Work

## 2019-08-29 NOTE — NC FL2 (Signed)
[place] LEVEL OF CARE SCREENING TOOL     IDENTIFICATION  Patient Name: Roberta Bryant Birthdate: 1950/11/21 Sex: female Admission Date (Current Location): 08/11/2019  [employer] and [place] Number:  [employer] and Address:  The [place], [place] [address] [place], [place] [address]      Provider Number: [id number]  Attending Physician Name and Address:  Murlean Iba, MD  Relative Name and Phone Number:       Current Level of Care: Hospital Recommended Level of Care: [place] Prior Approval Number:    Date Approved/Denied:   PASRR Number: [id number]  Discharge Plan: SNF    Current Diagnoses: Patient Active Problem List   Diagnosis Date Noted  . Benign neoplasm of ascending colon   . Diverticulosis of colon with hemorrhage   . Rectal ulcer   . GIB (gastrointestinal bleeding) 08/21/2019  . CVA (cerebral vascular accident) ([place]) 05/30/2019  . Shoulder arthritis 01/06/2018  . Rotator cuff arthropathy of right shoulder   . Status post arthroscopy of right shoulder 06/22/2017  . Spondylosis of cervical spine 06/16/2017  . Trigger index finger of right hand 06/11/2017  . Aortic stenosis 04/12/2017  . Coronary artery diseaseStatus post PTCA and stenting with drug-eluting stent to left main coronary artery in the summer 2017 04/12/2017  . 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 Decreased ROM of finger 03/01/2017  . Decreased ROM of wrist 03/01/2017  . Pain in finger of right hand 03/01/2017  . Headache syndrome 01/08/2017  . Right carpal tunnel syndrome 12/14/2016  . Complete tear of right rotator cuff 10/21/2016  . Gait abnormality 08/28/2016  . Nasal injury, initial encounter 07/01/2016  . Nasal septal deviation 07/01/2016  . Nasal turbinate hypertrophy 07/01/2016  . Abnormal stress test 01/01/2016  . Chest pain 11/23/2015  . ESRD on dialysis ([place])   . Type 2 diabetes mellitus with complication ([place])   .  Essential hypertension   . Dyslipidemia 12/24/2014  . Hemodialysis patient ([place]) 12/24/2014  . Abnormal ECG   . Pain in the chest   . Hypotension 10/30/2014  . Abnormal EKG 10/30/2014  . 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 Essential tremor 01/10/2014  . Parkinson's disease ([place]) 01/10/2014  . Migraine without aura 07/28/2013  . End stage renal disease ([place]) 07/18/2012  . Other complications due to renal dialysis device, implant, and graft 07/18/2012    Orientation RESPIRATION BLADDER Height & Weight     Self, Time, Situation, Place  Normal Incontinent Weight: 190 lb 11.2 oz (86.5 kg) Height:  4\' 10"  (147.3 cm)  BEHAVIORAL SYMPTOMS/MOOD NEUROLOGICAL BOWEL NUTRITION STATUS      Incontinent Diet(see discharge summary)  AMBULATORY STATUS COMMUNICATION OF NEEDS Skin   Extensive Assist Verbally Skin abrasions, Other (Comment)(generalized ecchymosis; skin tears on arm and hip w/ foam dressing)                       Personal Care Assistance Level of Assistance  Bathing, Feeding, Dressing Bathing Assistance: Maximum assistance Feeding assistance: Limited assistance Dressing Assistance: Maximum assistance     Functional Limitations Info  Hearing, Speech, Sight Sight Info: Adequate Hearing Info: Adequate Speech Info: Adequate    SPECIAL CARE FACTORS FREQUENCY  PT (By licensed PT), OT (By licensed OT)     PT Frequency: 5x week OT Frequency: 5x week            Contractures Contractures Info: Not present  Additional Factors Info  Code Status, Allergies, Insulin Sliding Scale Code Status Info: Full Code Allergies Info: Sulfa Antibiotics, Amoxicillin, Crestor (Rosuvastatin Calcium), Ibuprofen, Naldecon Senior (Guaifenesin), Statins, Chlorphen-phenyleph-asa, Gabapentin   Insulin Sliding Scale Info: insulin aspart (novoLOG) injection 0-6 Units every 4 hrs       Current Medications (08/24/2019):  This is the current hospital active medication  list Current Facility-Administered Medications  Medication Dose Route Frequency Provider Last Rate Last Admin  . 0.9 %  sodium chloride infusion  100 mL Intravenous PRN Irene Shipper, MD      . 0.9 %  sodium chloride infusion  100 mL Intravenous PRN Irene Shipper, MD      . acetaminophen (TYLENOL) tablet 650 mg  650 mg Oral Q4H PRN Irene Shipper, MD      . calcitRIOL (ROCALTROL) capsule 1 mcg  1 mcg Oral Q M,W,F-HD Irene Shipper, MD   1 mcg at 08/12/2019 1823  . Chlorhexidine Gluconate Cloth 2 % PADS 6 each  6 each Topical Q0600 Alric Seton, PA-C   6 each at 09/02/2019 0930  . [START ON 08/30/2019] Darbepoetin Alfa (ARANESP) injection 100 mcg  100 mcg Intravenous Q Wed-HD Irene Shipper, MD      . feeding supplement (PRO-STAT SUGAR FREE 64) liquid 30 mL  30 mL Oral BID Irene Shipper, MD   30 mL at 08/07/2019 2147  . heparin injection 1,000 Units  1,000 Units Dialysis PRN Irene Shipper, MD      . insulin aspart (novoLOG) injection 0-6 Units  0-6 Units Subcutaneous Q4H Irene Shipper, MD      . levothyroxine (SYNTHROID) tablet 50 mcg  50 mcg Oral QAC breakfast Irene Shipper, MD   50 mcg at 08/14/2019 4315  . multivitamin (RENA-VIT) tablet 1 tablet  1 tablet Oral QHS Irene Shipper, MD   1 tablet at 08/31/2019 2147  . ondansetron (ZOFRAN) injection 4 mg  4 mg Intravenous Q6H PRN Irene Shipper, MD      . pantoprazole (PROTONIX) EC tablet 40 mg  40 mg Oral Q0600 Irene Shipper, MD   40 mg at 08/19/2019 4008  . phytonadione (VITAMIN K) tablet 10 mg  10 mg Oral Daily Irene Shipper, MD   10 mg at 08/23/2019 1051     Discharge Medications: Please see discharge summary for a list of discharge medications.  Relevant Imaging Results:  Relevant Lab Results:   Additional Information SSN: [id number]  [place]

## 2019-08-29 NOTE — TOC Initial Note (Signed)
Transition of Care [facility]) - Initial/Assessment Note    Patient Details  Name: Roberta Bryant MRN: [id number] Date of Birth: 03-Nov-1950  Transition of [facility]) CM/SW Contact:    Alexander Mt, LCSW Phone Number: 08/15/2019, 3:06 PM  Clinical Narrative:                 CSW spoke with pt at bedside. Introduced self, role, reason for visit. Pt fixated on wanting to be adjusted in the bed. After RN assisted pt was able to confirm her home address and PCP. Pt has two adult children; one of her sons Roberta works during the day and [name] works at night.   Pt has multiple pieces of DME at home including bedside commode, walker and wheelchair. She used to be able to utilize these with assistance at home but states she is having more and more difficulty with moving around etc. PT did mention to this Probation officer that pt had mentioned issues with food. Pt states that she doesn't have an appetite much and has lost "50 lbs". CSW inquired about issues with finances or obtaining medications or food and pt denies either. She takes [facility] to dialysis.  We discussed recommendations for SNF and getting stronger before returning home. Pt amenable to this and sending out referral. CSW will f/u with pt with her offers when available.   Expected Discharge Plan: Skilled Nursing Facility Barriers to Discharge: Continued Medical Work up   Patient Goals and CMS Choice Patient states their goals for this hospitalization and ongoing recovery are:: get stronger, be able to do more for myself CMS Medicare.gov Compare Post Acute Care list provided to:: Patient Choice offered to / list presented to : Patient  Expected Discharge Plan and Services Expected Discharge Plan: [place] In-house Referral: Clinical Social Work Discharge Planning Services: CM Consult Post Acute Care Choice: [place] Living arrangements for the past 2 months: [place]  Prior Living  Arrangements/Services Living arrangements for the past 2 months: Single Family Home Lives with:: Adult Children Patient language and need for interpreter reviewed:: Yes(no needs) Do you feel safe going back to the place where you live?: Yes      Need for Family Participation in Patient Care: Yes (Comment)(assistance with daily cares) Care giver support system in place?: Yes (comment)(pt sons) Current home services: DME, Home PT, Home OT, Home RN Criminal Activity/Legal Involvement Pertinent to Current Situation/Hospitalization: No - Comment as needed  Activities of Daily Living Home Assistive Devices/Equipment: Bedside commode/3-in-1, Wheelchair, Environmental consultant (specify type), Grab bars in shower, Grab bars around toilet ADL Screening (condition at time of admission) Patient's cognitive ability adequate to safely complete daily activities?: Yes Is the patient deaf or have difficulty hearing?: No Does the patient have difficulty seeing, even when wearing glasses/contacts?: Yes Does the patient have difficulty concentrating, remembering, or making decisions?: Yes Patient able to express need for assistance with ADLs?: Yes Does the patient have difficulty dressing or bathing?: Yes Independently performs ADLs?: No Communication: Independent Dressing (OT): Needs assistance Is this a change from baseline?: Pre-admission baseline Grooming: Needs assistance Is this a change from baseline?: Pre-admission baseline Feeding: Independent Bathing: Needs assistance Is this a change from baseline?: Pre-admission baseline Toileting: Needs assistance Is this a change from baseline?: Pre-admission baseline In/Out Bed: Needs assistance Is this a change from baseline?: Pre-admission baseline Walks in Home: (wheelchair dependent) Does the patient have difficulty walking or climbing stairs?: Yes Weakness of Legs: Both Weakness of Arms/Hands: Both  Permission Sought/Granted Permission sought to share  information with : Facility Sport and exercise psychologist, Family Supports Permission granted to share information with : Yes, Verbal Permission Granted  Share Information with NAME: Roberta Bryant, [place] granted to share info w AGENCY: SNFs  Permission granted to share info w Relationship: sons  Permission granted to share info w Contact Information: [phone] [facility])  Emotional Assessment Appearance:: Appears stated age Attitude/Demeanor/Rapport: Inconsistent Affect (typically observed): Restless, Accepting, Overwhelmed Orientation: : Oriented to Situation, Oriented to  Time, Oriented to Place, Oriented to Self Alcohol / Substance Use: Not Applicable Psych Involvement: (n/a at this time)  Admission diagnosis:  GIB (gastrointestinal bleeding) [K92.2] Lower GI bleed [K92.2] Patient Active Problem List   Diagnosis Date Noted  . Benign neoplasm of ascending colon   . Diverticulosis of colon with hemorrhage   . Rectal ulcer   . GIB (gastrointestinal bleeding) 09/03/2019  . CVA (cerebral vascular accident) ([place]) 05/30/2019  . Shoulder arthritis 01/06/2018  . Rotator cuff arthropathy of right shoulder   . Status post arthroscopy of right shoulder 06/22/2017  . Spondylosis of cervical spine 06/16/2017  . Trigger index finger of right hand 06/11/2017  . Aortic stenosis 04/12/2017  . Coronary artery diseaseStatus post PTCA and stenting with drug-eluting stent to left main coronary artery in the summer 2017 04/12/2017  . 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 Decreased ROM of finger 03/01/2017  . Decreased ROM of wrist 03/01/2017  . Pain in finger of right hand 03/01/2017  . Headache syndrome 01/08/2017  . Right carpal tunnel syndrome 12/14/2016  . Complete tear of right rotator cuff 10/21/2016  . Gait abnormality 08/28/2016  . Nasal injury, initial encounter 07/01/2016  . Nasal septal deviation 07/01/2016  . Nasal turbinate hypertrophy 07/01/2016  . Abnormal stress test 01/01/2016   . Chest pain 11/23/2015  . ESRD on dialysis ([place])   . Type 2 diabetes mellitus with complication ([place])   . Essential hypertension   . Dyslipidemia 12/24/2014  . Hemodialysis patient ([place]) 12/24/2014  . Abnormal ECG   . Pain in the chest   . Hypotension 10/30/2014  . Abnormal EKG 10/30/2014  . 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 Essential tremor 01/10/2014  . Parkinson's disease ([place]) 01/10/2014  . Migraine without aura 07/28/2013  . End stage renal disease ([place]) 07/18/2012  . Other complications due to renal dialysis device, implant, and graft 07/18/2012   PCP:  [name], MD Pharmacy:   [facility] [address], [place] [address] Phone: [phone] Fax: [phone]   Readmission Risk Interventions Readmission Risk Prevention Plan 08/19/2019  Transportation Screening Complete  Medication Review (RN Care Manager) Referral to Pharmacy  PCP or Specialist appointment within 3-5 days of discharge Not Complete  PCP/Specialist Appt Not Complete comments plan for poss SNF  [place] or Home Care Consult Complete  SW Recovery Care/Counseling Consult Complete  Palliative Care Screening Not Applicable  Skilled Nursing Facility Complete  Some recent data might be hidden

## 2019-08-29 NOTE — Progress Notes (Signed)
PROGRESS NOTE [facility]   Roberta Bryant  RJJ:[id number]  DOB: 02-06-1951  DOA: 08/15/2019 PCP: [name], MD   Brief Admission Hx: 69 year old female admitted with acute blood loss anemia in the setting of GI bleeding.  She has been seen by GI.  She has end-stage renal disease on hemodialysis and is followed by nephrology.  EGD was done 08/15/2019 and was nondiagnostic.  She is scheduled for colonoscopy 08/28/2019.  MDM/Assessment & Plan:    1. Acute blood loss anemia-highly suspicious for GI bleeding.  She is being evaluated by GI and they are planning colonoscopy later today.  Her EGD was nondiagnostic.  Her hemoglobin was 4.9 on arrival and today is 7.8 after transfusions.  Following closely. S/p vit K.  2. End-stage renal disease on hemodialysis MWF -appreciate assistance of nephrology team arranging hemodialysis while in hospital. 3. Hypothyroidism - stable s/p parathyroidectomy. 4. Essential hypertension - some soft BPs. Following.  5. GERD - remains on protonix.  6. Type 2 DM - sensitive SSI coverage and frequent CBG monitoring in place.  7. Anxiety and depression - stable.    DVT prophylaxis: SCDs Code Status: full  Disposition Plan: from home, continue GI workup for GI bleeding, colonoscopy today   Consultants:  GI  nephrology  Procedures:  Hemodialysis   Antimicrobials:     Subjective: Pt without specific complaint today.   Objective: Vitals:   08/12/2019 2300 09/01/2019 0414 08/27/2019 0458 08/28/2019 0845  BP:  132/61  (!) 164/85  Pulse:  93    Resp:  18    Temp:  98.1 F (36.7 C)  99.7 F (37.6 C)  TempSrc:  Oral  Oral  SpO2: 99% 100%  100%  Weight:   86.5 kg 86.5 kg  Height:    4\' 10"  (1.473 m)    Intake/Output Summary (Last 24 hours) at 09/05/2019 0909 Last data filed at 08/16/2019 1800 Gross per 24 hour  Intake 1319.42 ml  Output 900 ml  Net 419.42 ml   Filed Weights   08/16/2019 1516 08/14/2019 0458 09/05/2019 0845  Weight: 85.6  kg 86.5 kg 86.5 kg    Data Reviewed: Basic Metabolic Panel: Recent Labs  Lab 08/23/19 1436 08/23/19 1436 08/23/19 1713 08/14/2019 1247 08/23/2019 1321 09/06/2019 0739 08/18/2019 1608  NA 134*   < > 136 140 139 139 136  K 3.4*   < > 3.3* 3.6 3.5 4.0 4.1  CL 98  --   --  103 101 104 100  CO2 22  --   --  24  --   --  25  GLUCOSE 113*  --   --  107* 95 88 86  BUN 18  --   --  20 24* 28* 12  CREATININE 6.63*  --   --  5.43* 5.80* 6.30* 3.33*  CALCIUM 8.2*  --   --  7.0*  --   --  6.7*  PHOS  --   --   --   --   --   --  1.8*   < > = values in this interval not displayed.   Liver Function Tests: Recent Labs  Lab 09/01/2019 1247 08/27/2019 1608  AST 36  --   ALT 17  --   ALKPHOS 50  --   BILITOT 1.4*  --   PROT 4.3*  --   ALBUMIN 1.5* 1.6*   No results for input(s): LIPASE, AMYLASE in the last 168 hours. No results for input(s): AMMONIA in  the last 168 hours. CBC: Recent Labs  Lab 09/04/2019 1247 08/10/2019 1321 08/17/2019 2105 08/07/2019 2105 08/25/2019 0344 09/04/2019 0739 09/04/2019 0905 08/17/2019 1815 08/14/2019 0304  WBC 5.2  --  5.9  --  5.9  --  7.0  --  5.6  NEUTROABS 3.4  --   --   --   --   --   --   --   --   HGB 4.9*   < > 8.6*   < > 8.3* 8.5* 8.6* 8.7* 7.8*  HCT 16.0*   < > 26.7*   < > 25.5* 25.0* 27.3* 27.2* 24.4*  MCV 127.0*  --  100.0  --  98.8  --  103.8*  --  102.1*  PLT 151  --  143*  --  134*  --  142*  --  153   < > = values in this interval not displayed.   Cardiac Enzymes: No results for input(s): CKTOTAL, CKMB, CKMBINDEX, TROPONINI in the last 168 hours. CBG (last 3)  Recent Labs    08/30/2019 2336 08/14/2019 0411 08/29/19 0757  GLUCAP 88 100* 88   Recent Results (from the past 240 hour(s))  Respiratory Panel by RT PCR (Flu A&B, Covid) - Nasopharyngeal Swab     Status: None   Collection Time: 08/26/2019  3:17 PM   Specimen: Nasopharyngeal Swab  Result Value Ref Range Status   SARS Coronavirus 2 by RT PCR NEGATIVE NEGATIVE Final    Comment: (NOTE) SARS-CoV-2  target nucleic acids are NOT DETECTED. The SARS-CoV-2 RNA is generally detectable in upper respiratoy specimens during the acute phase of infection. The lowest concentration of SARS-CoV-2 viral copies this assay can detect is 131 copies/mL. A negative result does not preclude SARS-Cov-2 infection and should not be used as the sole basis for treatment or other patient management decisions. A negative result may occur with  improper specimen collection/handling, submission of specimen other than nasopharyngeal swab, presence of viral mutation(s) within the areas targeted by this assay, and inadequate number of viral copies (<131 copies/mL). A negative result must be combined with clinical observations, patient history, and epidemiological information. The expected result is Negative. Fact Sheet for Patients:  PinkCheek.be Fact Sheet for Healthcare Providers:  GravelBags.it This test is not yet ap proved or cleared by the [place] FDA and  has been authorized for detection and/or diagnosis of SARS-CoV-2 by FDA under an Emergency Use Authorization (EUA). This EUA will remain  in effect (meaning this test can be used) for the duration of the COVID-19 declaration under Section 564(b)(1) of the Act, 21 U.S.C. section 360bbb-3(b)(1), unless the authorization is terminated or revoked sooner.    Influenza A by PCR NEGATIVE NEGATIVE Final   Influenza B by PCR NEGATIVE NEGATIVE Final    Comment: (NOTE) The Xpert Xpress SARS-CoV-2/FLU/RSV assay is intended as an aid in  the diagnosis of influenza from Nasopharyngeal swab specimens and  should not be used as a sole basis for treatment. Nasal washings and  aspirates are unacceptable for Xpert Xpress SARS-CoV-2/FLU/RSV  testing. Fact Sheet for Patients: PinkCheek.be Fact Sheet for Healthcare Providers: GravelBags.it This test is  not yet approved or cleared by the [place] FDA and  has been authorized for detection and/or diagnosis of SARS-CoV-2 by  FDA under an Emergency Use Authorization (EUA). This EUA will remain  in effect (meaning this test can be used) for the duration of the  Covid-19 declaration under Section 564(b)(1) of the Act, 21  U.S.C.  section 360bbb-3(b)(1), unless the authorization is  terminated or revoked. Performed at [facility], [place] [address], [place], [place] [address]   MRSA PCR Screening     Status: None   Collection Time: 08/19/2019  9:21 PM   Specimen: Nasopharyngeal  Result Value Ref Range Status   MRSA by PCR NEGATIVE NEGATIVE Final    Comment:        The GeneXpert MRSA Assay (FDA approved for NASAL specimens only), is one component of a comprehensive MRSA colonization surveillance program. It is not intended to diagnose MRSA infection nor to guide or monitor treatment for MRSA infections. Performed at [facility], [place] [address], [place], [place] [address]      Studies: DG Chest Port 1 View  Result Date: 08/18/2019 CLINICAL DATA:  GI bleeding. EXAM: PORTABLE CHEST 1 VIEW COMPARISON:  Yesterday FINDINGS: Low volume chest with interstitial crowding. Normal heart size for technique. Stable upper mediastinal contours. Postoperative thoracic inlet. Right glenohumeral arthroplasty. IMPRESSION: Stable low volume chest without acute finding. Electronically Signed   By: Monte Fantasia M.D.   On: 09/05/2019 07:51   DG Chest Port 1 View  Result Date: 08/21/2019 CLINICAL DATA:  Pt here from home for black stools with red streaks, weakness, pale, abdominal pain, and hypotension. Dialysis patient. EXAM: PORTABLE CHEST 1 VIEW COMPARISON:  Chest radiograph 08/09/2019 FINDINGS: Stable cardiomediastinal contours. Low lung volumes. Scattered bibasilar opacities likely reflecting atelectasis. No new focal consolidation. No pneumothorax or significant pleural effusion. Status  post right shoulder arthroplasty. IMPRESSION: Low lung volumes with bibasilar atelectasis. Electronically Signed   By: Audie Pinto M.D.   On: 08/30/2019 13:31     Scheduled Meds: . [MAR Hold] calcitRIOL  1 mcg Oral Q M,W,F-HD  . [MAR Hold] Chlorhexidine Gluconate Cloth  6 each Topical Q0600  . [MAR Hold] darbepoetin (ARANESP) injection - DIALYSIS  100 mcg Intravenous Q Wed-HD  . [MAR Hold] feeding supplement (PRO-STAT SUGAR FREE 64)  30 mL Oral BID  . [MAR Hold] insulin aspart  0-6 Units Subcutaneous Q4H  . [MAR Hold] levothyroxine  50 mcg Oral QAC breakfast  . [MAR Hold] multivitamin  1 tablet Oral QHS  . [MAR Hold] pantoprazole  40 mg Oral Q0600  . [MAR Hold] phytonadione  10 mg Oral Daily   Continuous Infusions: . [MAR Hold] sodium chloride    . [MAR Hold] sodium chloride    . sodium chloride 20 mL/hr at 08/22/2019 3825    Active Problems:   GIB (gastrointestinal bleeding)   Time spent:   Irwin Brakeman, MD [facility] 08/17/2019, 9:09 AM    LOS: 2 days  How to contact the [facility] Attending or Consulting provider [place] or covering provider during after hours [place], for this patient?  1. Check the care team in [facility] and look for a) attending/consulting [facility] provider listed and b) the [facility] team listed 2. Log into [email or web address] and use [place]'s universal password to access. If you do not have the password, please contact the hospital operator. 3. Locate the [facility] provider you are looking for under [facility] and page to a number that you can be directly reached. 4. If you still have difficulty reaching the provider, please page the [facility] (Director on Call) for the Hospitalists listed on amion for assistance.

## 2019-08-29 NOTE — Interval H&P Note (Signed)
History and Physical Interval Note:  09/06/2019 9:25 AM  Roberta Bryant  has presented today for surgery, with the diagnosis of anemia, GI bleed.  The various methods of treatment have been discussed with the patient and family. After consideration of risks, benefits and other options for treatment, the patient has consented to  Procedure(s): COLONOSCOPY WITH PROPOFOL (N/A) as a surgical intervention.  The patient's history has been reviewed, patient examined, no change in status, stable for surgery.  I have reviewed the patient's chart and labs.  Questions were answered to the patient's satisfaction.     Scarlette Shorts

## 2019-08-29 NOTE — Anesthesia Procedure Notes (Signed)
Procedure Name: MAC Date/Time: 08/10/2019 9:29 AM Performed by: Inda Coke, CRNA Pre-anesthesia Checklist: Patient identified, Emergency Drugs available, Suction available, Timeout performed and Patient being monitored Patient Re-evaluated:Patient Re-evaluated prior to induction Oxygen Delivery Method: Nasal cannula Induction Type: IV induction Dental Injury: Teeth and Oropharynx as per pre-operative assessment

## 2019-08-29 NOTE — Transfer of Care (Signed)
Immediate Anesthesia Transfer of Care Note  Patient: Roberta Bryant  Procedure(s) Performed: COLONOSCOPY WITH PROPOFOL (N/A ) POLYPECTOMY  Patient Location: PACU and Endoscopy Unit  Anesthesia Type:MAC  Level of Consciousness: awake  Airway & Oxygen Therapy: Patient Spontanous Breathing and Patient connected to nasal cannula oxygen  Post-op Assessment: Report given to RN and Post -op Vital signs reviewed and stable  Post vital signs: Reviewed and stable  Last Vitals:  Vitals Value Taken Time  BP 128/53 08/30/2019 1007  Temp    Pulse 88 08/17/2019 1012  Resp 23 09/04/2019 1012  SpO2 98 % 08/23/2019 1012  Vitals shown include unvalidated device data.  Last Pain:  Vitals:   09/03/2019 0845  TempSrc: Oral  PainSc: 0-No pain         Complications: No apparent anesthesia complications

## 2019-08-29 NOTE — Progress Notes (Signed)
Initial Nutrition Assessment  DOCUMENTATION CODES:   Obesity unspecified  INTERVENTION:   -Continue 30 ml Prostat BID, each supplement provides 100 kcals and 15 grams protein -Continue renal MVI daily -Nepro Shake po BID, each supplement provides 425 kcal and 19 grams protein  NUTRITION DIAGNOSIS:   Inadequate oral intake related to altered GI function as evidenced by meal completion < 25%.  GOAL:   Patient will meet greater than or equal to 90% of their needs  MONITOR:   PO intake, Supplement acceptance, Diet advancement, Labs, Weight trends, Skin, I & O's  REASON FOR ASSESSMENT:   Malnutrition Screening Tool    ASSESSMENT:   69 y/o F who presented [facility] on 3/21 with reports of dark stools and altered mental status.  Pt admitted with hematochezia and acute blood loss anemia.  3/22- s/p upper endoscopy- revealed gastric polyps (biopsied), mild erosive gastropathy with no bleeding (biopsied), nodule in duodenum (biopsied)  Reviewed I/O's: +519 ml x 24 hours and +1 L since admission  Attempted to speak with pt x 2, however, down for colonoscopy at times of visits.   Pt was previously on clear liquid diet with minimal intake; noted PO 0-10%.   Per nephrology notes, EDW 85 kg. Reviewed wt hx; wt has been stable over the past 2 years.   Given decreased oral intake, pt would greatly benefit from oral nutrition supplements.   Therapies recommending SNF at discharge.   Labs reviewed: CBGS: 88-100 (inaptient orders for glycemic control are 0-6 units insulin aspart TID with meals).   Diet Order:   Diet Order            Diet renal with fluid restriction Fluid restriction: 1200 mL Fluid; Room service appropriate? Yes; Fluid consistency: Thin  Diet effective now              EDUCATION NEEDS:   No education needs have been identified at this time  Skin:  Skin Assessment: Reviewed RN Assessment  Last BM:  08/24/2019  Height:   Ht Readings from Last 1 Encounters:   08/27/2019 4\' 10"  (1.473 m)    Weight:   Wt Readings from Last 1 Encounters:  09/04/2019 86.5 kg    Ideal Body Weight:  44.1 kg  BMI:  Body mass index is 39.86 kg/m.  Estimated Nutritional Needs:   Kcal:  1900-2100  Protein:  90-105 grams  Fluid:  1000 ml + UOP    Loistine Chance, RD, LDN, [place] Registered Dietitian II Certified Diabetes Care and Education Specialist Please refer to [facility] for RD and/or RD on-call/weekend/after hours pager

## 2019-08-29 NOTE — Progress Notes (Addendum)
[facility] Progress Note   Dialysis Orders: [facility] MWF 3.5 hr EDW 85 400/A 1.5 3 K 2.25 Ca 36 degrees profile 4 var Na 148 linear left upper  AVF heparin 4000 with 1500 mid tmt Mircera 100 q 2 wks -last had 60 on 3/10 calcitriol 1 Binders: fosrenol 2 gm ac  Assessment/Plan: 1. ABLA - secondary to diverticular bleed - s/p colonoscopy with mult tics, 6 polyps removed, linear non bleeding rectal ulcer holding heparin - s/p transfusion; GI following 2. ESRD -  MWF - on HD tolerating well - recent bleeding issues with access - no heparin HD today - currently AP/VP are wnl while on HD with Qb 400 - no bleeding problems yesterday - next HD Wed. Closer exam of access and access arm today shows some generalized edema more so than the right.- last f'gram 05/2018.  AF have been variable without intervention over the last year. Will continue to monitor and ask Dr. Posey Pronto to assess. 3. Hypertension/volume  - BP better today - goal 1.5 Monday with neg UF 900 ml- keep SBP > 100 - 4. Anemia  - due for redose ESA 3/24-  s/p transfusion so results Fe studies high at 92% sat and ferritin 940 - -  hgb  7.8 3/23 today 5. Metabolic bone disease -  Add binders when eating - on Calcitriol for Ca support I think since hx parathyroidectomy; corrected Ca ok since alb is so low 6. Nutrition - alb severely low   - added prostat- diet advanced to renal 7. Anxeity/depression 8. CAD 9. DM/hypothyroidism - per primary 10.  DNR 11. GOC - per conversation with dialysis staff today, they believe she needs SNF placement - functional status has declined since she had COVID last month. [place] [phone] 08/26/2019,8:53 AM  LOS: 2 days   Subjective:   Back from colonoscopy - wants some water.  Doesn't want to go to dialysis tomorrow.  Not aware that left arm (access arm) is more swollen than right  Objective Vitals:   08/15/2019 2115 08/21/2019 2300 08/13/2019 0414  08/21/2019 0458  BP: 116/71  132/61   Pulse: 86  93   Resp: 18  18   Temp: 97.7 F (36.5 C)  98.1 F (36.7 C)   TempSrc: Oral  Oral   SpO2: 99% 99% 100%   Weight:    86.5 kg  Height:       Physical Exam General: NAD frail Heart: RRR Lungs: no rales Abdomen: soft obese Extremities: no sig LE edema Dialysis Access: left upper AVF + bruit - some generalized edema of LUE compared to right UE   Additional Objective Labs: Basic Metabolic Panel: Recent Labs  Lab 08/23/19 1436 08/23/19 1713 08/19/2019 1247 08/12/2019 1247 09/04/2019 1321 08/07/2019 0739 08/13/2019 1608  NA 134*   < > 140   < > 139 139 136  K 3.4*   < > 3.6   < > 3.5 4.0 4.1  CL 98   < > 103   < > 101 104 100  CO2 22  --  24  --   --   --  25  GLUCOSE 113*   < > 107*   < > 95 88 86  BUN 18   < > 20   < > 24* 28* 12  CREATININE 6.63*   < > 5.43*   < > 5.80* 6.30* 3.33*  CALCIUM 8.2*  --  7.0*  --   --   --  6.7*  PHOS  --   --   --   --   --   --  1.8*   < > = values in this interval not displayed.   Liver Function Tests: Recent Labs  Lab 08/09/2019 1247 08/08/2019 1608  AST 36  --   ALT 17  --   ALKPHOS 50  --   BILITOT 1.4*  --   PROT 4.3*  --   ALBUMIN 1.5* 1.6*   No results for input(s): LIPASE, AMYLASE in the last 168 hours. CBC: Recent Labs  Lab 08/23/2019 1247 08/26/2019 1321 09/05/2019 2105 08/17/2019 2105 08/18/2019 0344 08/19/2019 0739 08/07/2019 0905 09/03/2019 1815 08/20/2019 0304  WBC 5.2   < > 5.9   < > 5.9  --  7.0  --  5.6  NEUTROABS 3.4  --   --   --   --   --   --   --   --   HGB 4.9*   < > 8.6*   < > 8.3*   < > 8.6* 8.7* 7.8*  HCT 16.0*   < > 26.7*   < > 25.5*   < > 27.3* 27.2* 24.4*  MCV 127.0*  --  100.0  --  98.8  --  103.8*  --  102.1*  PLT 151   < > 143*   < > 134*  --  142*  --  153   < > = values in this interval not displayed.   Blood Culture    Component Value Date/Time   SDES URINE, CLEAN CATCH 01/04/2018 1524   SPECREQUEST  01/04/2018 1524    NONE Performed at [facility] [address], [place], [place] [address]    CULT MULTIPLE SPECIES PRESENT, SUGGEST RECOLLECTION (A) 01/04/2018 1524   REPTSTATUS 01/05/2018 FINAL 01/04/2018 1524    Cardiac Enzymes: No results for input(s): CKTOTAL, CKMB, CKMBINDEX, TROPONINI in the last 168 hours. CBG: Recent Labs  Lab 08/24/2019 0848 09/01/2019 2123 08/21/2019 2336 08/25/2019 0411 08/26/2019 0757  GLUCAP 87 89 88 100* 88   Iron Studies:  Recent Labs    08/27/2019 1608  IRON 102  TIBC 111*  FERRITIN 940*   Lab Results  Component Value Date   INR 2.1 (H) 08/24/2019   INR 1.2 06/06/2019   INR 1.1 03/30/2019   Studies/Results: DG Chest Port 1 View  Result Date: 08/12/2019 CLINICAL DATA:  GI bleeding. EXAM: PORTABLE CHEST 1 VIEW COMPARISON:  Yesterday FINDINGS: Low volume chest with interstitial crowding. Normal heart size for technique. Stable upper mediastinal contours. Postoperative thoracic inlet. Right glenohumeral arthroplasty. IMPRESSION: Stable low volume chest without acute finding. Electronically Signed   By: Monte Fantasia M.D.   On: 09/05/2019 07:51   DG Chest Port 1 View  Result Date: 08/19/2019 CLINICAL DATA:  Pt here from home for black stools with red streaks, weakness, pale, abdominal pain, and hypotension. Dialysis patient. EXAM: PORTABLE CHEST 1 VIEW COMPARISON:  Chest radiograph 08/09/2019 FINDINGS: Stable cardiomediastinal contours. Low lung volumes. Scattered bibasilar opacities likely reflecting atelectasis. No new focal consolidation. No pneumothorax or significant pleural effusion. Status post right shoulder arthroplasty. IMPRESSION: Low lung volumes with bibasilar atelectasis. Electronically Signed   By: Audie Pinto M.D.   On: 09/05/2019 13:31   Medications: . [MAR Hold] sodium chloride    . [MAR Hold] sodium chloride    . sodium chloride 20 mL/hr at 08/25/2019 1001   . [MAR Hold] calcitRIOL  1 mcg Oral Q M,W,F-HD  . [  MAR Hold] Chlorhexidine Gluconate Cloth  6 each Topical Q0600  .  [MAR Hold] darbepoetin (ARANESP) injection - DIALYSIS  100 mcg Intravenous Q Wed-HD  . [MAR Hold] feeding supplement (PRO-STAT SUGAR FREE 64)  30 mL Oral BID  . [MAR Hold] insulin aspart  0-6 Units Subcutaneous Q4H  . [MAR Hold] levothyroxine  50 mcg Oral QAC breakfast  . [MAR Hold] multivitamin  1 tablet Oral QHS  . [MAR Hold] pantoprazole  40 mg Oral Q0600  . [MAR Hold] phytonadione  10 mg Oral Daily

## 2019-08-29 NOTE — Anesthesia Postprocedure Evaluation (Signed)
Anesthesia Post Note  Patient: Roberta Bryant  Procedure(s) Performed: COLONOSCOPY WITH PROPOFOL (N/A ) POLYPECTOMY     Patient location during evaluation: Endoscopy Anesthesia Type: MAC Level of consciousness: awake and alert Pain management: pain level controlled Vital Signs Assessment: post-procedure vital signs reviewed and stable Respiratory status: spontaneous breathing, nonlabored ventilation and respiratory function stable Cardiovascular status: blood pressure returned to baseline and stable Postop Assessment: no apparent nausea or vomiting Anesthetic complications: no    Last Vitals:  Vitals:   08/30/2019 1025 08/30/2019 1103  BP: (!) 156/71 (!) 146/75  Pulse:  92  Resp: (!) 25 20  Temp:  36.4 C  SpO2: 100% 100%    Last Pain:  Vitals:   08/24/2019 1103  TempSrc: Oral  PainSc:                  [name]

## 2019-08-30 DIAGNOSIS — K5731 Diverticulosis of large intestine without perforation or abscess with bleeding: Principal | ICD-10-CM

## 2019-08-30 DIAGNOSIS — D122 Benign neoplasm of ascending colon: Secondary | ICD-10-CM

## 2019-08-30 DIAGNOSIS — K626 Ulcer of anus and rectum: Secondary | ICD-10-CM

## 2019-08-30 LAB — RENAL FUNCTION PANEL
Albumin: 1.6 g/dL — ABNORMAL LOW (ref 3.5–5.0)
Anion gap: 12 (ref 5–15)
BUN: 14 mg/dL (ref 8–23)
CO2: 23 mmol/L (ref 22–32)
Calcium: 7 mg/dL — ABNORMAL LOW (ref 8.9–10.3)
Chloride: 103 mmol/L (ref 98–111)
Creatinine, Ser: 4.53 mg/dL — ABNORMAL HIGH (ref 0.44–1.00)
GFR calc Af Amer: 11 mL/min — ABNORMAL LOW (ref >60)
GFR calc non Af Amer: 9 mL/min — ABNORMAL LOW (ref >60)
Glucose, Bld: 105 mg/dL — ABNORMAL HIGH (ref 70–99)
Phosphorus: 1.9 mg/dL — ABNORMAL LOW (ref 2.5–4.6)
Potassium: 3.7 mmol/L (ref 3.5–5.1)
Sodium: 138 mmol/L (ref 135–145)

## 2019-08-30 LAB — GLUCOSE, CAPILLARY
Glucose-Capillary: 96 mg/dL (ref 70–99)
Glucose-Capillary: 84 mg/dL (ref 70–99)
Glucose-Capillary: 91 mg/dL (ref 70–99)
Glucose-Capillary: 85 mg/dL (ref 70–99)

## 2019-08-30 LAB — CBC
HCT: 18.9 % — ABNORMAL LOW (ref 36.0–46.0)
Hemoglobin: 6.1 g/dL — CL (ref 12.0–15.0)
MCH: 33.2 pg (ref 26.0–34.0)
MCHC: 32.3 g/dL (ref 30.0–36.0)
MCV: 102.7 fL — ABNORMAL HIGH (ref 80.0–100.0)
Platelets: 141 10*3/uL — ABNORMAL LOW (ref 150–400)
RBC: 1.84 MIL/uL — ABNORMAL LOW (ref 3.87–5.11)
RDW: 27.7 % — ABNORMAL HIGH (ref 11.5–15.5)
WBC: 4.2 10*3/uL (ref 4.0–10.5)
nRBC: 0 % (ref 0.0–0.2)

## 2019-08-30 LAB — MAGNESIUM: Magnesium: 1.6 mg/dL — ABNORMAL LOW (ref 1.7–2.4)

## 2019-08-30 LAB — PROTIME-INR
INR: 1.5 — ABNORMAL HIGH (ref 0.8–1.2)
Prothrombin Time: 17.9 seconds — ABNORMAL HIGH (ref 11.4–15.2)

## 2019-08-30 LAB — SURGICAL PATHOLOGY

## 2019-08-30 LAB — PREPARE RBC (CROSSMATCH)

## 2019-08-30 LAB — HEMOGLOBIN AND HEMATOCRIT, BLOOD
HCT: 23 % — ABNORMAL LOW (ref 36.0–46.0)
Hemoglobin: 7.5 g/dL — ABNORMAL LOW (ref 12.0–15.0)

## 2019-08-30 MED ORDER — SODIUM CHLORIDE 0.9% IV SOLUTION: Freq: Once | INTRAVENOUS | Status: AC

## 2019-08-30 MED ORDER — CALCITRIOL 0.5 MCG PO CAPS
ORAL_CAPSULE | ORAL | Status: AC
  Filled 2019-08-30: qty 2

## 2019-08-30 MED ORDER — DARBEPOETIN ALFA 100 MCG/0.5ML IJ SOSY
PREFILLED_SYRINGE | INTRAMUSCULAR | Status: AC
  Filled 2019-08-30: qty 0.5

## 2019-08-30 NOTE — Procedures (Signed)
Patient seen on Hemodialysis. BP (!) 90/43   Pulse 96   Temp 97.9 F (36.6 C) (Oral)   Resp 20   Ht 4\' 10"  (1.473 m)   Wt 83.9 kg   SpO2 100%   BMI 38.66 kg/m   QB 400, UF goal 1.3L Tolerating treatment without complaints at this time. LUA AVF cannulated.    Elmarie Shiley MD [facility]. Office # [phone] Pager # [phone] 9:44 AM

## 2019-08-30 NOTE — Progress Notes (Signed)
PROGRESS NOTE    Roberta Bryant  EPP:[id number] DOB: November 22, 1950 DOA: 08/17/2019 PCP: [name], MD   Brief Narrative: 57 YOF ESRD on HD, hypertension, CAD, diabetes, hypothyroidism presented with acute blood loss anemia in the setting of GI bleed, was seen by GI, nephrology.  Underwent EGD 3/22 nondiagnostic and colonoscopy 3/23.  Subjective: Morning during dialysis session was getting her blood transfusion and dialysis.  Denies any nausea vomiting.  Last appointment was yesterday denies any bleeding.   Assessment & Plan:  GIB dark/bloody stool status post EGD 08/2008 colonoscopy 3/23-with mild nonbleeding erosive gastropathy, peptic duodenitis, 6 polyps removed from right colon and left colon and had left colon diverticulosis no stigmata of bleeding.  Pathology shows tubular adenomas no EGD ulcers not biopsied.  Continue Protonix 40 mg daily, GI following closely.  Acute blood loss anemia in the setting of anemia of chronic kidney disease: Hemoglobin as low as 5.4 g-s/p 1 PRBC and increased to 8.6 g, 3/24 am hb 6.1 g, getting 1 unit PRBC in dialysis.  Monitor CBC in a.m. continue Aranesp, no iron deficiency per study 3/22 Recent Labs  Lab 08/11/2019 0739 08/13/2019 0905 08/12/2019 1815 08/08/2019 0304 08/30/19 0418  HGB 8.5* 8.6* 8.7* 7.8* 6.1*  HCT 25.0* 27.3* 27.2* 24.4* 18.9*   Bleeding from dialysis fistula continue routine care  ESRD on HD MWF getting dialysis today.  Appreciate nephrology input.  LUE aVF pulsatile with LUE larger than RUE ?CV Stenosis- fistulogram has been ordered by nephrology  Thrombocytopenia: stable, monitor Recent Labs  Lab 09/01/2019 2105 08/30/2019 0344 08/18/2019 0905 08/24/2019 0304 08/30/19 0418  PLT 143* 134* 142* 153 141*   Elevated INR 2.0 on admission, improved to 1.5.  Continue daily vitamin K.  Hypertension blood pressure stable continue to adjust fluid and dialysis  Metabolic bone disease continue calcitriol.  Anxiety/depression: Mood  is stable resume home Klonopin as tolerated  CAD: No chest pain.  Monitor.  Home meds aspirin Coreg on hold in the setting of GI bleed, soft blood pressure  Diabetes mellitus: Hemoglobin A1c 4.9.  Continue sliding scale insulin.  Hold her home Januvia.  Hypothyroidism: Continue Synthroid  Deconditioning: Continue PT OT likely need a skilled nursing facility placement as suggested by PT.   Nutrition: Diet Order            Diet renal with fluid restriction Fluid restriction: 1200 mL Fluid; Room service appropriate? Yes; Fluid consistency: Thin  Diet effective now              Nutrition Problem: Inadequate oral intake Etiology: altered GI function Signs/Symptoms: meal completion < 25% Interventions: MVI, Prostat, Nepro shake Body mass index is 38.66 kg/m.  Pressure Ulcer:    DVT prophylaxis:SCD-holding chemical prophylaxis due to anemia Code Status:FULL Family Communication: plan of care discussed with patient at bedside. Disposition Plan: Patient is from:home Anticipated Disposition: to  SNF Barriers to discharge or conditions that needs to be met prior to discharge: Patient admitted with anemia 4.9 underwent EGD and colonoscopy, still hemoglobin low needing intermittent blood transfusion and being monitored, getting 1 unit PRBC in dialysis today.  Continue work with PT OT, likely to skilled nursing facility.  Consultants:see note  Procedures: 09/04/2019 EGD: A few gastric polyps biopsied: hyperplastic polyp with eroded granulation tissue and surface reactive change.  Mild, nonbleeding, erosive gastropathy biopsied: Mild reactive gastropathy with some surface hyperplastic change, no H. Pylori.  Duodenal nodule biopsied: peptic duodenitis.  Protonix 40/day in place, Prilosec 20/daily, 81 ASA/daily PTA.  Marland Kitchen  08/24/2019 colonoscopy.  6 polyps removed located in right and left colon.  Left colon diverticulosis.  Solitary rectal ulcer, no stigmata of bleeding. Pathology shows tubular  adenomas, no HGD.  Ulcer not biopsied.   Microbiology:see note   Medications: Scheduled Meds: . calcitRIOL  1 mcg Oral Q M,W,F-HD  . Chlorhexidine Gluconate Cloth  6 each Topical Q0600  . darbepoetin (ARANESP) injection - DIALYSIS  100 mcg Intravenous Q Wed-HD  . feeding supplement (NEPRO CARB STEADY)  237 mL Oral BID BM  . feeding supplement (PRO-STAT SUGAR FREE 64)  30 mL Oral BID  . insulin aspart  0-6 Units Subcutaneous TID WC  . levothyroxine  50 mcg Oral QAC breakfast  . multivitamin  1 tablet Oral QHS  . pantoprazole  40 mg Oral Q0600  . phytonadione  10 mg Oral Daily   Continuous Infusions: . sodium chloride    . sodium chloride      Antimicrobials: Anti-infectives (From admission, onward)   None       Objective: Vitals: Today's Vitals   08/30/19 1000 08/30/19 1030 08/30/19 1100 08/30/19 1232  BP: (!) 98/40 (!) 117/32 (!) 92/48 99/64  Pulse: 90 94 (!) 102 88  Resp:    17  Temp:    (!) 97.4 F (36.3 C)  TempSrc:    Oral  SpO2:    100%  Weight:      Height:      PainSc:    0-No pain    Intake/Output Summary (Last 24 hours) at 08/30/2019 1426 Last data filed at 08/30/2019 0915 Gross per 24 hour  Intake 365 ml  Output -  Net 365 ml   Filed Weights   08/09/2019 0458 08/07/2019 0845 08/30/19 0746  Weight: 86.5 kg 86.5 kg 83.9 kg   Weight change: 0 kg   Intake/Output from previous day: 03/23 0701 - 03/24 0700 In: 500 [P.O.:200; I.V.:300] Out: 0  Intake/Output this shift: Total I/O In: 315 [Blood:315] Out: -   Examination:  General exam: AAO, weak, NAD HEENT:Oral mucosa moist, Ear/Nose WNL grossly,dentition normal. Respiratory system: bilaterally clear,no wheezing or crackles,no use of accessory muscle, non tender. Cardiovascular system: S1 & S2 +, regular, No JVD. Gastrointestinal system: Abdomen soft, NT,ND, BS+. Nervous System:Alert, awake, moving extremities and grossly nonfocal Extremities: No edema, distal peripheral pulses palpable.  Skin:  No rashes,no icterus. MSK: Normal muscle bulk,tone, power  Data Reviewed: I have personally reviewed following labs and imaging studies CBC: Recent Labs  Lab 09/04/2019 1247 09/04/2019 1321 08/22/2019 2105 08/09/2019 2105 09/04/2019 0344 09/03/2019 0344 08/09/2019 0739 08/20/2019 0905 09/03/2019 1815 08/30/2019 0304 08/30/19 0418  WBC 5.2   < > 5.9  --  5.9  --   --  7.0  --  5.6 4.2  NEUTROABS 3.4  --   --   --   --   --   --   --   --   --   --   HGB 4.9*   < > 8.6*   < > 8.3*   < > 8.5* 8.6* 8.7* 7.8* 6.1*  HCT 16.0*   < > 26.7*   < > 25.5*   < > 25.0* 27.3* 27.2* 24.4* 18.9*  MCV 127.0*   < > 100.0  --  98.8  --   --  103.8*  --  102.1* 102.7*  PLT 151   < > 143*  --  134*  --   --  142*  --  153 141*   < > =  values in this interval not displayed.   Basic Metabolic Panel: Recent Labs  Lab 08/23/19 1436 08/23/19 1713 08/31/2019 1247 08/15/2019 1321 08/23/2019 0739 09/02/2019 1608 08/30/19 0418  NA 134*   < > 140 139 139 136 138  K 3.4*   < > 3.6 3.5 4.0 4.1 3.7  CL 98   < > 103 101 104 100 103  CO2 22  --  24  --   --  25 23  GLUCOSE 113*   < > 107* 95 88 86 105*  BUN 18   < > 20 24* 28* 12 14  CREATININE 6.63*   < > 5.43* 5.80* 6.30* 3.33* 4.53*  CALCIUM 8.2*  --  7.0*  --   --  6.7* 7.0*  MG  --   --   --   --   --   --  1.6*  PHOS  --   --   --   --   --  1.8* 1.9*   < > = values in this interval not displayed.   GFR: Estimated Creatinine Clearance: 10.8 mL/min (A) (by C-G formula based on SCr of 4.53 mg/dL (H)). Liver Function Tests: Recent Labs  Lab 09/04/2019 1247 08/08/2019 1608 08/30/19 0418  AST 36  --   --   ALT 17  --   --   ALKPHOS 50  --   --   BILITOT 1.4*  --   --   PROT 4.3*  --   --   ALBUMIN 1.5* 1.6* 1.6*   No results for input(s): LIPASE, AMYLASE in the last 168 hours. No results for input(s): AMMONIA in the last 168 hours. Coagulation Profile: Recent Labs  Lab 08/26/2019 1247 08/12/2019 1206 08/30/19 0418  INR 2.1* 1.5* 1.5*   Cardiac Enzymes: No results  for input(s): CKTOTAL, CKMB, CKMBINDEX, TROPONINI in the last 168 hours. BNP (last 3 results) No results for input(s): PROBNP in the last 8760 hours. HbA1C: Recent Labs    08/07/2019 2105  HGBA1C 4.9   CBG: Recent Labs  Lab 08/25/2019 1137 08/17/2019 1547 08/10/2019 1959 08/30/19 0327 08/30/19 1233  GLUCAP 81 104* 87 96 84   Lipid Profile: No results for input(s): CHOL, HDL, LDLCALC, TRIG, CHOLHDL, LDLDIRECT in the last 72 hours. Thyroid Function Tests: No results for input(s): TSH, T4TOTAL, FREET4, T3FREE, THYROIDAB in the last 72 hours. Anemia Panel: Recent Labs    09/06/2019 1608 08/26/2019 1815  VITAMINB12 789  --   FOLATE 4.4*  --   FERRITIN 940*  --   TIBC 111*  --   IRON 102  --   RETICCTPCT  --  3.1   Sepsis Labs: Recent Labs  Lab 08/15/2019 1247  LATICACIDVEN 1.8    Recent Results (from the past 240 hour(s))  Respiratory Panel by RT PCR (Flu A&B, Covid) - Nasopharyngeal Swab     Status: None   Collection Time: 08/14/2019  3:17 PM   Specimen: Nasopharyngeal Swab  Result Value Ref Range Status   SARS Coronavirus 2 by RT PCR NEGATIVE NEGATIVE Final    Comment: (NOTE) SARS-CoV-2 target nucleic acids are NOT DETECTED. The SARS-CoV-2 RNA is generally detectable in upper respiratoy specimens during the acute phase of infection. The lowest concentration of SARS-CoV-2 viral copies this assay can detect is 131 copies/mL. A negative result does not preclude SARS-Cov-2 infection and should not be used as the sole basis for treatment or other patient management decisions. A negative result may occur with  improper specimen  collection/handling, submission of specimen other than nasopharyngeal swab, presence of viral mutation(s) within the areas targeted by this assay, and inadequate number of viral copies (<131 copies/mL). A negative result must be combined with clinical observations, patient history, and epidemiological information. The expected result is Negative. Fact  Sheet for Patients:  PinkCheek.be Fact Sheet for Healthcare Providers:  GravelBags.it This test is not yet ap proved or cleared by the [place] FDA and  has been authorized for detection and/or diagnosis of SARS-CoV-2 by FDA under an Emergency Use Authorization (EUA). This EUA will remain  in effect (meaning this test can be used) for the duration of the COVID-19 declaration under Section 564(b)(1) of the Act, 21 U.S.C. section 360bbb-3(b)(1), unless the authorization is terminated or revoked sooner.    Influenza A by PCR NEGATIVE NEGATIVE Final   Influenza B by PCR NEGATIVE NEGATIVE Final    Comment: (NOTE) The Xpert Xpress SARS-CoV-2/FLU/RSV assay is intended as an aid in  the diagnosis of influenza from Nasopharyngeal swab specimens and  should not be used as a sole basis for treatment. Nasal washings and  aspirates are unacceptable for Xpert Xpress SARS-CoV-2/FLU/RSV  testing. Fact Sheet for Patients: PinkCheek.be Fact Sheet for Healthcare Providers: GravelBags.it This test is not yet approved or cleared by the [place] FDA and  has been authorized for detection and/or diagnosis of SARS-CoV-2 by  FDA under an Emergency Use Authorization (EUA). This EUA will remain  in effect (meaning this test can be used) for the duration of the  Covid-19 declaration under Section 564(b)(1) of the Act, 21  U.S.C. section 360bbb-3(b)(1), unless the authorization is  terminated or revoked. Performed at [facility], [place] [address] [place], [place] [address]   MRSA PCR Screening     Status: None   Collection Time: 08/23/2019  9:21 PM   Specimen: Nasopharyngeal  Result Value Ref Range Status   MRSA by PCR NEGATIVE NEGATIVE Final    Comment:        The GeneXpert MRSA Assay (FDA approved for NASAL specimens only), is one component of a comprehensive MRSA  colonization surveillance program. It is not intended to diagnose MRSA infection nor to guide or monitor treatment for MRSA infections. Performed at [facility], [place] [address] [place], [place] [address]       Radiology Studies: No results found.   LOS: 3 days   Time spent: More than 50% of that time was spent in counseling and/or coordination of care.  Antonieta Pert, MD [facility]  08/30/2019, 2:26 PM

## 2019-08-30 NOTE — Progress Notes (Addendum)
Daily Rounding Note  08/30/2019, 11:54 AM  LOS: 3 days   SUBJECTIVE:   Chief complaint:  Anemia, dark/bloody stool      No BM's since prep.  Feels worn out after HD session this AM  OBJECTIVE:         Vital signs in last 24 hours:    Temp:  [97.8 F (36.6 C)-98.7 F (37.1 C)] 97.9 F (36.6 C) (03/24 0915) Pulse Rate:  [87-108] 102 (03/24 1100) Resp:  [17-23] 20 (03/24 0915) BP: (86-117)/(32-69) 92/48 (03/24 1100) SpO2:  [93 %-100 %] 100 % (03/24 0915) Weight:  [83.9 kg] 83.9 kg (03/24 0746) Last BM Date: 08/12/2019 Filed Weights   08/30/2019 0458 08/11/2019 0845 08/30/19 0746  Weight: 86.5 kg 86.5 kg 83.9 kg   General: looks unwell   Heart: RRR with harsh murmer. Chest: clear bil.  No dyspnea or cough Abdomen: soft, ND, obese, BS present.  Mild tenderness on left  Extremities: swelling in LUE Neuro/Psych:  Oriented x 3.  Appropriate.  Unable to lift arms off bed from shoulders bil, Ok lifting forearms from elbow bil.  No tremors.    Intake/Output from previous day: 03/23 0701 - 03/24 0700 In: 500 [P.O.:200; I.V.:300] Out: 0   Intake/Output this shift: Total I/O In: 315 [Blood:315] Out: -   Lab Results: Recent Labs    09/01/2019 0905 08/15/2019 0905 09/06/2019 1815 08/25/2019 0304 08/30/19 0418  WBC 7.0  --   --  5.6 4.2  HGB 8.6*   < > 8.7* 7.8* 6.1*  HCT 27.3*   < > 27.2* 24.4* 18.9*  PLT 142*  --   --  153 141*   < > = values in this interval not displayed.   BMET Recent Labs    08/25/2019 1247 08/19/2019 1321 08/24/2019 0739 08/14/2019 1608 08/30/19 0418  NA 140   < > 139 136 138  K 3.6   < > 4.0 4.1 3.7  CL 103   < > 104 100 103  CO2 24  --   --  25 23  GLUCOSE 107*   < > 88 86 105*  BUN 20   < > 28* 12 14  CREATININE 5.43*   < > 6.30* 3.33* 4.53*  CALCIUM 7.0*  --   --  6.7* 7.0*   < > = values in this interval not displayed.   LFT Recent Labs    08/31/2019 1247 08/12/2019 1608 08/30/19 0418    PROT 4.3*  --   --   ALBUMIN 1.5* 1.6* 1.6*  AST 36  --   --   ALT 17  --   --   ALKPHOS 50  --   --   BILITOT 1.4*  --   --    PT/INR Recent Labs    08/14/2019 1206 08/30/19 0418  LABPROT 18.4* 17.9*  INR 1.5* 1.5*   Scheduled Meds: . calcitRIOL  1 mcg Oral Q M,W,F-HD  . Chlorhexidine Gluconate Cloth  6 each Topical Q0600  . darbepoetin (ARANESP) injection - DIALYSIS  100 mcg Intravenous Q Wed-HD  . feeding supplement (NEPRO CARB STEADY)  237 mL Oral BID BM  . feeding supplement (PRO-STAT SUGAR FREE 64)  30 mL Oral BID  . insulin aspart  0-6 Units Subcutaneous TID WC  . levothyroxine  50 mcg Oral QAC breakfast  . multivitamin  1 tablet Oral QHS  . pantoprazole  40 mg Oral Q0600  . phytonadione  10 mg Oral Daily   Continuous Infusions: . sodium chloride    . sodium chloride     PRN Meds:.sodium chloride, sodium chloride, acetaminophen, heparin, ondansetron (ZOFRAN) IV   ASSESMENT:   *    Diverticular bleeding--GI bleeding: Dark/bloody stools at presentation. 08/14/2019 EGD: A few gastric polyps biopsied: hyperplastic polyp with eroded granulation tissue and surface reactive change.  Mild, nonbleeding, erosive gastropathy biopsied: Mild reactive gastropathy with some surface hyperplastic change, no H. Pylori.  Duodenal nodule biopsied: peptic duodenitis.  Protonix 40/day in place, Prilosec 20/daily, 81 ASA/daily PTA.  .   08/12/2019 colonoscopy.  6 polyps removed located in right and left colon.  Left colon diverticulosis with fresh blood that was cleared with irrigation.  Solitary rectal ulcer, no stigmata of bleeding. Pathology shows tubular adenomas, no HGD.  Ulcer not biopsied.    *    Bleeding from dialysis fistula  *    Blood loss anemia.  Hgb 5.4 >> 1 PRBC >> 8.7 >> 6.1 >> 1 PRBC >> pndg.  Aranesp in place.  Not iron deficient per studies 3/22.    *   Thrombocytopenia.  Noncritical.  First noted 07/2019  *    Elevated INR, 2.1 on admission >> 1.5 now.  Daily po Vit  K in place.     *   ESRD.   MWF hemodialysis.  *    Declining functional status, frail health.  Plan for SNF rehab at discharge.   PLAN   *Supportive care.  Maintain Protonix 40 mg po daily or Omeprazole 40 mg/day at home.      Roberta Bryant  08/30/2019, 11:54 AM Phone [phone]  GI ATTENDING  Interval history data reviewed.  Patient seen and examined.  Underwent dialysis today.  No further bleeding.  Patient has had a self-limited diverticular bleed.  Also an incidental large solitary rectal ulcer secondary to chronic constipation.  Treatment at this point is expectant management.  Feed the patient.  Observe for any evidence of rebleeding.  Keep her bowels soft with MiraLAX to prevent recurrent constipation.  We are available for questions or problems.  Thanks  Docia Chuck. Geri Seminole., M.D. [facility] Division of Gastroenterology

## 2019-08-30 NOTE — Evaluation (Signed)
Occupational Therapy Evaluation Patient Details Name: Roberta Bryant MRN: [id number] DOB: 1951-04-20 Today's Date: 08/30/2019    History of Present Illness Pt is a 69 y/o female admitted secondary to acute blood loss anemia, likely from GI source. Pt is s/p endoscopy and s/p colonoscopy with cold snare polypectomy X6. PMH includes ESRD on HD MWF and GERD.    Clinical Impression   PTA patient requires assist for ADLs, mobility from sons.  Admitted for above and limited by problem list below, including weakness, impaired balance, impaired cognition, and decreased activity tolerance.  She is re-oriented to year, follows simple commands with increased time, requires redirection to task frequently.  She requires max assist +2 to transition to EOB and total assist +2 to return supine, max-total assist for ADLs at this time.  Elevated UEs on pillows to support L UE edema and promote comfort.  Encouraged BUE ROM exercises in bed. She will benefit from further OT services while admitted and after dc at SNF level in order to optimize independence in ADLs and decrease burden of care. Will follow acutely.     Follow Up Recommendations  SNF;Supervision/Assistance - 24 hour    Equipment Recommendations  Other (comment)(TBD at next venue of care)    Recommendations for Other Services       Precautions / Restrictions Precautions Precautions: Fall Restrictions Weight Bearing Restrictions: No      Mobility Bed Mobility Overal bed mobility: Needs Assistance Bed Mobility: Supine to Sit;Sit to Supine     Supine to sit: Max assist;+2 for physical assistance;+2 for safety/equipment Sit to supine: Total assist;+2 for physical assistance;+2 for safety/equipment   General bed mobility comments: max assist to transtion to EOB, able to initate movements but due to weakness requires max asisst to fully transition; total assist to return supine   Transfers                 General transfer  comment: deferred    Balance Overall balance assessment: Needs assistance Sitting-balance support: No upper extremity supported;Feet supported Sitting balance-Leahy Scale: Fair                                     ADL either performed or assessed with clinical judgement   ADL Overall ADL's : Needs assistance/impaired     Grooming: Maximal assistance;Sitting   Upper Body Bathing: Maximal assistance;Bed level   Lower Body Bathing: Total assistance;+2 for physical assistance;+2 for safety/equipment;Bed level   Upper Body Dressing : Maximal assistance;Sitting   Lower Body Dressing: Bed level;Total assistance;+2 for safety/equipment;+2 for physical assistance     Toilet Transfer Details (indicate cue type and reason): deferred         Functional mobility during ADLs: Total assistance;Maximal assistance;+2 for physical assistance;+2 for safety/equipment General ADL Comments: pt limited by cognition, weakness, body habitus and impaired balance      Vision   Vision Assessment?: No apparent visual deficits     Perception     Praxis      Pertinent Vitals/Pain Pain Assessment: No/denies pain     Hand Dominance Right   Extremity/Trunk Assessment Upper Extremity Assessment Upper Extremity Assessment: RUE deficits/detail;LUE deficits/detail RUE Deficits / Details: grossly 2-/5 shoulder, 3/5 elbow and hand (hx shoulder replacement) RUE Sensation: decreased light touch RUE Coordination: decreased fine motor;decreased gross motor LUE Deficits / Details: grossly 2-/5 shoulder, 3/5 elbow and hand; increased edema  LUE Sensation: decreased  light touch LUE Coordination: decreased fine motor;decreased gross motor   Lower Extremity Assessment Lower Extremity Assessment: Defer to PT evaluation   Cervical / Trunk Assessment Cervical / Trunk Assessment: Kyphotic   Communication Communication Communication: HOH   Cognition Arousal/Alertness: Awake/alert Behavior  During Therapy: WFL for tasks assessed/performed Overall Cognitive Status: Impaired/Different from baseline Area of Impairment: Orientation;Attention;Memory;Safety/judgement;Following commands;Awareness;Problem solving                 Orientation Level: Disoriented to;Time(reports 2022 ) Current Attention Level: Sustained Memory: Decreased short-term memory Following Commands: Follows one step commands consistently;Follows one step commands with increased time;Follows multi-step commands inconsistently Safety/Judgement: Decreased awareness of safety;Decreased awareness of deficits Awareness: Intellectual Problem Solving: Slow processing;Decreased initiation;Difficulty sequencing;Requires verbal cues;Requires tactile cues General Comments: slow processing, difficulty problem sovling and poor awareness; tangential and requires redirection; poor safety awareness    General Comments       Exercises     Shoulder Instructions      Home Living Family/patient expects to be discharged to:: Private residence Living Arrangements: Children Available Help at Discharge: Family;Available PRN/intermittently Type of Home: House Home Access: Ramped entrance     Home Layout: One level     Bathroom Shower/Tub: Occupational psychologist: Standard     Home Equipment: Walker - standard;Wheelchair - [employer];Shower seat - built in;Grab bars - tub/shower          Prior Functioning/Environment Level of Independence: Needs assistance  Gait / Transfers Assistance Needed: Reports sons had to help with transfers to and from [facility]  ADL's / Homemaking Assistance Needed: sons assisting with ADLs, pt able to brush teeth but increased difficulty with feeding             OT Problem List: Decreased strength;Decreased range of motion;Decreased activity tolerance;Impaired balance (sitting and/or standing);Decreased coordination;Decreased cognition;Decreased safety  awareness;Decreased knowledge of use of DME or AE;Decreased knowledge of precautions;Obesity      OT Treatment/Interventions: Self-care/ADL training;DME and/or AE instruction;Therapeutic exercise;Therapeutic activities;Cognitive remediation/compensation;Patient/family education;Balance training    OT Goals(Current goals can be found in the care plan section) Acute Rehab OT Goals Patient Stated Goal: to go to rehab before going home  OT Goal Formulation: With patient/family Time For Goal Achievement: Oct 12, 2019 Potential to Achieve Goals: Fair  OT Frequency: Min 2X/week   Barriers to D/C:            Co-evaluation              AM-PAC OT "6 Clicks" Daily Activity     Outcome Measure Help from another person eating meals?: A Lot Help from another person taking care of personal grooming?: A Lot Help from another person toileting, which includes using toliet, bedpan, or urinal?: Total Help from another person bathing (including washing, rinsing, drying)?: A Lot Help from another person to put on and taking off regular upper body clothing?: A Lot Help from another person to put on and taking off regular lower body clothing?: Total 6 Click Score: 10   End of Session Nurse Communication: Mobility status  Activity Tolerance: Patient tolerated treatment well Patient left: in bed;with call bell/phone within reach;with bed alarm set;with family/visitor present  OT Visit Diagnosis: Other abnormalities of gait and mobility (R26.89);Muscle weakness (generalized) (M62.81);History of falling (Z91.81);Other symptoms and signs involving cognitive function                Time: 9937-1696 OT Time Calculation (min): 28 min Charges:  OT General Charges $OT Visit: 1 Visit  OT Evaluation $OT Eval Moderate Complexity: 1 Mod OT Treatments $Self Care/Home Management : 8-22 mins  Jolaine Artist, OT Acute Rehabilitation Services Pager [phone] Office [phone]   Delight Stare 08/30/2019,  2:39 PM

## 2019-08-30 NOTE — Progress Notes (Signed)
CRITICAL VALUE ALERT  Critical Value:  Hgb=6.1  Date & Time Notied:  08/30/2019 0545  Provider Notified: K.Kirby NP  Orders Received/Actions taken: transfuse 1 unit of PRBC

## 2019-08-30 NOTE — TOC Progression Note (Signed)
Transition of Care [facility]) - Progression Note    Patient Details  Name: ARMONII SIEH MRN: [id number] Date of Birth: Jan 16, 1951  Transition of Care [facility]) CM/SW [place], [place] Phone Number: 08/30/2019, 3:45 PM  Clinical Narrative:    CSW has two SNF offers at this time. Confirmed both able to transport to dialysis at [facility]. Will provide pt with CMS list for [place].    Expected Discharge Plan: [place] Barriers to Discharge: Continued Medical Work up  Expected Discharge Plan and Services Expected Discharge Plan: [place] In-house Referral: Clinical Social Work Discharge Planning Services: CM Consult Post Acute Care Choice: [place] arrangements for the past 2 months: Single Family Home  Readmission Risk Interventions Readmission Risk Prevention Plan 08/14/2019  Transportation Screening Complete  Medication Review Press photographer) Referral to Pharmacy  PCP or Specialist appointment within 3-5 days of discharge Not Complete  PCP/Specialist Appt Not Complete comments plan for poss SNF  [place] or Home Care Consult Complete  SW Recovery Care/Counseling Consult Complete  Palliative Care Screening Not Applicable  Skilled Nursing Facility Complete  Some recent data might be hidden

## 2019-08-30 NOTE — Progress Notes (Signed)
[facility] Progress Note   Dialysis Orders: [facility] MWF 3.5 hr EDW 85 400/A 1.5 3 K 2.25 Ca 36 degrees profile 4 var Na 148 linear left upper  AVF heparin 4000 with 1500 mid tmt Mircera 100 q 2 wks -last had 60 on 3/10 calcitriol 1 Binders: fosrenol 2 gm ac  Assessment/Plan: 1. ABLA - secondary to diverticular bleed - s/p colonoscopy with mult tics, 6 polyps removed, linear non bleeding rectal ulcer holding heparin - s/p transfusion; GI following 2. ESRD -  MWF - on HD tolerating well - recent bleeding issues with access - no heparin HD today - currently AP/VP are wnl while on HD with Qb 400 - no bleeding problems yesterday - next HD Wed. Closer exam of access and access arm shows some generalized edema more so than the right.- last f'gram 05/2018.  AF have been variable without intervention over the last year. Will continue to monitor and ask Dr. Posey Pronto to assess. K 3.7 - changed to 4 K bath 3. Hypertension/volume  - BP better today - goal 1.5 Monday with neg UF 900 ml- keep SBP > 100 - 4. Anemia  - due for redose ESA today-  s/p transfusion with repeat  Fe studies high at 92% sat and ferritin 940 - -  hgb  7.8 3/23 >drop  6.1 3/24 - 1 unit PRBC - to be transfused on HD today - continue to monitor - transfuse prn 5. Metabolic bone disease -  Add binders when eating - on Calcitriol for Ca support I think since hx parathyroidectomy; corrected Ca ok since alb is so low 6. Nutrition - alb severely low   - added prostat- diet advanced to renal 7. Anxeity/depression 8. CAD 9. DM/hypothyroidism - per primary 10.  DNR 11. GOC - per conversation with dialysis staff today, they believe she needs SNF placement due to frailty and decline in functional status - appreciate SW working on this transition - patient amenable to SNF for rehab  Myriam Jacobson, PA-C [place] [phone] 08/30/2019,8:14 AM  LOS: 3 days   Subjective:   Seen on HD. Goal 1.5  Bottom  hurts.  Objective Vitals:   08/24/2019 1804 08/19/2019 2004 08/30/19 0402 08/30/19 0800  BP: 95/63 (!) 104/56 114/67 103/63  Pulse: 98 (!) 108 (!) 104 (!) 105  Resp: 20 18 18    Temp: 98.5 F (36.9 C) 98.1 F (36.7 C) 98.5 F (36.9 C)   TempSrc: Oral Oral Oral   SpO2: 100% 98% 100%   Weight:      Height:       Physical Exam General: NAD frail Heart: RRR Lungs: no rales Abdomen: soft obese Extremities: no sig LE edema Dialysis Access: left upper AVF + bruit - some generalized edema of LUE compared to right UE   Additional Objective Labs: Basic Metabolic Panel: Recent Labs  Lab 08/10/2019 1247 08/08/2019 1321 09/03/2019 0739 08/25/2019 1608 08/30/19 0418  NA 140   < > 139 136 138  K 3.6   < > 4.0 4.1 3.7  CL 103   < > 104 100 103  CO2 24  --   --  25 23  GLUCOSE 107*   < > 88 86 105*  BUN 20   < > 28* 12 14  CREATININE 5.43*   < > 6.30* 3.33* 4.53*  CALCIUM 7.0*  --   --  6.7* 7.0*  PHOS  --   --   --  1.8* 1.9*   < > =  values in this interval not displayed.   Liver Function Tests: Recent Labs  Lab 08/13/2019 1247 08/18/2019 1608 08/30/19 0418  AST 36  --   --   ALT 17  --   --   ALKPHOS 50  --   --   BILITOT 1.4*  --   --   PROT 4.3*  --   --   ALBUMIN 1.5* 1.6* 1.6*   No results for input(s): LIPASE, AMYLASE in the last 168 hours. CBC: Recent Labs  Lab 08/31/2019 1247 08/24/2019 1321 08/23/2019 2105 08/12/2019 2105 08/20/2019 0344 08/27/2019 0739 08/24/2019 0905 08/27/2019 0905 08/21/2019 1815 08/08/2019 0304 08/30/19 0418  WBC 5.2   < > 5.9   < > 5.9   < > 7.0  --   --  5.6 4.2  NEUTROABS 3.4  --   --   --   --   --   --   --   --   --   --   HGB 4.9*   < > 8.6*   < > 8.3*   < > 8.6*   < > 8.7* 7.8* 6.1*  HCT 16.0*   < > 26.7*   < > 25.5*   < > 27.3*   < > 27.2* 24.4* 18.9*  MCV 127.0*   < > 100.0  --  98.8  --  103.8*  --   --  102.1* 102.7*  PLT 151   < > 143*   < > 134*   < > 142*  --   --  153 141*   < > = values in this interval not displayed.   Blood Culture     Component Value Date/Time   SDES URINE, CLEAN CATCH 01/04/2018 1524   SPECREQUEST  01/04/2018 1524    NONE Performed at [facility] [address], [place], [place] [address]    CULT MULTIPLE SPECIES PRESENT, SUGGEST RECOLLECTION (A) 01/04/2018 1524   REPTSTATUS 01/05/2018 FINAL 01/04/2018 1524    Cardiac Enzymes: No results for input(s): CKTOTAL, CKMB, CKMBINDEX, TROPONINI in the last 168 hours. CBG: Recent Labs  Lab 08/28/2019 0757 08/22/2019 1137 08/15/2019 1547 08/19/2019 1959 08/30/19 0327  GLUCAP 88 81 104* 87 96   Iron Studies:  Recent Labs    08/12/2019 1608  IRON 102  TIBC 111*  FERRITIN 940*   Lab Results  Component Value Date   INR 1.5 (H) 08/30/2019   INR 1.5 (H) 09/05/2019   INR 2.1 (H) 08/18/2019   Studies/Results: No results found. Medications: . sodium chloride    . sodium chloride     . calcitRIOL  1 mcg Oral Q M,W,F-HD  . Chlorhexidine Gluconate Cloth  6 each Topical Q0600  . darbepoetin (ARANESP) injection - DIALYSIS  100 mcg Intravenous Q Wed-HD  . feeding supplement (NEPRO CARB STEADY)  237 mL Oral BID BM  . feeding supplement (PRO-STAT SUGAR FREE 64)  30 mL Oral BID  . insulin aspart  0-6 Units Subcutaneous TID WC  . levothyroxine  50 mcg Oral QAC breakfast  . multivitamin  1 tablet Oral QHS  . pantoprazole  40 mg Oral Q0600  . phytonadione  10 mg Oral Daily

## 2019-08-30 NOTE — Progress Notes (Signed)
OT Cancellation Note  Patient Details Name: Roberta Bryant MRN: [id number] DOB: 1950/11/26   Cancelled Treatment:    Reason Eval/Treat Not Completed: Medical issues which prohibited therapy, hemoglobin 6.1 with noted plan to transfuse 1 unit of PRBC. Will follow and see as able.   Jolaine Artist, OT Acute Rehabilitation Services Pager [phone] Office [phone]    Delight Stare 08/30/2019, 7:47 AM

## 2019-08-31 ENCOUNTER — Inpatient Hospital Stay (HOSPITAL_COMMUNITY): Payer: Medicare Other

## 2019-08-31 LAB — BPAM RBC
Blood Product Expiration Date: 202104182359
Blood Product Expiration Date: 202104242359
Blood Product Expiration Date: 202104242359
ISSUE DATE / TIME: 202103211311
ISSUE DATE / TIME: 202103211514
ISSUE DATE / TIME: 202103240839
Unit Type and Rh: 5100
Unit Type and Rh: 5100
Unit Type and Rh: 5100

## 2019-08-31 LAB — TYPE AND SCREEN
ABO/RH(D): O POS
Antibody Screen: NEGATIVE
Unit division: 0
Unit division: 0
Unit division: 0

## 2019-08-31 LAB — CBC
HCT: 23.1 % — ABNORMAL LOW (ref 36.0–46.0)
Hemoglobin: 7.4 g/dL — ABNORMAL LOW (ref 12.0–15.0)
MCH: 31.4 pg (ref 26.0–34.0)
MCHC: 32 g/dL (ref 30.0–36.0)
MCV: 97.9 fL (ref 80.0–100.0)
Platelets: 123 10*3/uL — ABNORMAL LOW (ref 150–400)
RBC: 2.36 MIL/uL — ABNORMAL LOW (ref 3.87–5.11)
RDW: 25.1 % — ABNORMAL HIGH (ref 11.5–15.5)
WBC: 4.8 10*3/uL (ref 4.0–10.5)
nRBC: 0 % (ref 0.0–0.2)

## 2019-08-31 LAB — GLUCOSE, CAPILLARY
Glucose-Capillary: 125 mg/dL — ABNORMAL HIGH (ref 70–99)
Glucose-Capillary: 112 mg/dL — ABNORMAL HIGH (ref 70–99)
Glucose-Capillary: 100 mg/dL — ABNORMAL HIGH (ref 70–99)
Glucose-Capillary: 99 mg/dL (ref 70–99)
Glucose-Capillary: 103 mg/dL — ABNORMAL HIGH (ref 70–99)
Glucose-Capillary: 88 mg/dL (ref 70–99)

## 2019-08-31 LAB — BASIC METABOLIC PANEL
Anion gap: 12 (ref 5–15)
BUN: 7 mg/dL — ABNORMAL LOW (ref 8–23)
CO2: 24 mmol/L (ref 22–32)
Calcium: 7.1 mg/dL — ABNORMAL LOW (ref 8.9–10.3)
Chloride: 100 mmol/L (ref 98–111)
Creatinine, Ser: 2.88 mg/dL — ABNORMAL HIGH (ref 0.44–1.00)
GFR calc Af Amer: 19 mL/min — ABNORMAL LOW (ref >60)
GFR calc non Af Amer: 16 mL/min — ABNORMAL LOW (ref >60)
Glucose, Bld: 114 mg/dL — ABNORMAL HIGH (ref 70–99)
Potassium: 3.9 mmol/L (ref 3.5–5.1)
Sodium: 136 mmol/L (ref 135–145)

## 2019-08-31 LAB — HEMOGLOBIN AND HEMATOCRIT, BLOOD
HCT: 23.7 % — ABNORMAL LOW (ref 36.0–46.0)
HCT: 22.1 % — ABNORMAL LOW (ref 36.0–46.0)
Hemoglobin: 7.9 g/dL — ABNORMAL LOW (ref 12.0–15.0)
Hemoglobin: 7.1 g/dL — ABNORMAL LOW (ref 12.0–15.0)

## 2019-08-31 LAB — PREPARE RBC (CROSSMATCH)

## 2019-08-31 MED ORDER — IOHEXOL 300 MG/ML  SOLN
100.0000 mL | Freq: Once | INTRAMUSCULAR | Status: AC | PRN
  Administered 2019-08-31: 30 mL

## 2019-08-31 MED ORDER — LIDOCAINE HCL 1 % IJ SOLN
INTRAMUSCULAR | Status: AC
  Filled 2019-08-31: qty 20

## 2019-08-31 MED ORDER — IOHEXOL 350 MG/ML SOLN
100.0000 mL | Freq: Once | INTRAVENOUS | Status: AC | PRN
  Administered 2019-08-31: 100 mL via INTRAVENOUS

## 2019-08-31 MED ORDER — LIDOCAINE HCL (PF) 1 % IJ SOLN
INTRAMUSCULAR | Status: DC | PRN
  Administered 2019-08-31: 5 mL

## 2019-08-31 MED ORDER — HYDROCODONE-ACETAMINOPHEN 5-325 MG PO TABS
1.0000 | ORAL_TABLET | Freq: Four times a day (QID) | ORAL | Status: DC | PRN
  Administered 2019-08-31 – 2019-09-05 (×3): 1 via ORAL
  Filled 2019-08-31 (×3): qty 1

## 2019-08-31 MED ORDER — CHLORHEXIDINE GLUCONATE CLOTH 2 % EX PADS
6.0000 | MEDICATED_PAD | Freq: Every day | CUTANEOUS | Status: DC
  Administered 2019-09-01 – 2019-09-03 (×3): 6 via TOPICAL

## 2019-08-31 MED ORDER — SODIUM CHLORIDE 0.9% IV SOLUTION: Freq: Once | INTRAVENOUS | Status: AC

## 2019-08-31 MED ORDER — FENTANYL CITRATE (PF) 100 MCG/2ML IJ SOLN
INTRAMUSCULAR | Status: AC
  Filled 2019-08-31: qty 2

## 2019-08-31 MED ORDER — SODIUM CHLORIDE 0.9 % IV BOLUS
250.0000 mL | Freq: Once | INTRAVENOUS | Status: AC
  Administered 2019-08-31: 11:00:00 250 mL via INTRAVENOUS

## 2019-08-31 MED ORDER — SODIUM CHLORIDE 0.9 % IV BOLUS
250.0000 mL | Freq: Once | INTRAVENOUS | Status: AC
  Administered 2019-08-31: 250 mL via INTRAVENOUS

## 2019-08-31 MED ORDER — SODIUM CHLORIDE 0.9 % IV BOLUS
500.0000 mL | Freq: Once | INTRAVENOUS | Status: AC
  Administered 2019-08-31: 500 mL via INTRAVENOUS

## 2019-08-31 MED ORDER — MIDAZOLAM HCL 2 MG/2ML IJ SOLN
INTRAMUSCULAR | Status: AC
  Filled 2019-08-31: qty 2

## 2019-08-31 NOTE — Progress Notes (Signed)
Nutrition Follow-up  DOCUMENTATION CODES:   Obesity unspecified  INTERVENTION:   -Once diet is advanced, resume:  -30 ml Prostat BID, each supplement provides 100 kcals and 15 grams protein -Renal MVI daily -Nepro Shake po BID, each supplement provides 425 kcal and 19 grams protein  NUTRITION DIAGNOSIS:   Inadequate oral intake related to altered GI function as evidenced by meal completion < 25%.  Ongoing  GOAL:   Patient will meet greater than or equal to 90% of their needs  Unmet  MONITOR:   PO intake, Supplement acceptance, Diet advancement, Labs, Weight trends, Skin, I & O's  REASON FOR ASSESSMENT:   Malnutrition Screening Tool    ASSESSMENT:   69 y/o F who presented to [facility] on 3/21 with reports of dark stools and altered mental status.  3/22- s/p upper endoscopy- revealed gastric polyps (biopsied), mild erosive gastropathy with no bleeding (biopsied), nodule in duodenum (biopsied) 3/23- s/p colonoscopy   Reviewed I/O's: +58 ml x 24 hours and +1.6 L since admission  Pt in with MD at time of visit. She is now NPO secondary to bloody stools secondary to diverticular bleed found on colonoscopy. Plan for IR consult for possible embolization.   Pt previously on a renal diet with minimal intake; PO: 0-10%. Pt also with variable acceptance of supplements.   Per CSW notes, plan to d/c to SNF once medically stable.   Labs reviewed: CBGS: 99-100 (inpatient orders for glycemic control are 0-6 units insulin aspart TID with meals).  Diet Order:   Diet Order            Diet NPO time specified Except for: Sips with Meds  Diet effective now              EDUCATION NEEDS:   No education needs have been identified at this time  Skin:  Skin Assessment: Reviewed RN Assessment  Last BM:  08/31/19  Height:   Ht Readings from Last 1 Encounters:  09/05/2019 4\' 10"  (1.473 m)    Weight:   Wt Readings from Last 1 Encounters:  08/31/19 85.2 kg    Ideal Body  Weight:  44.1 kg  BMI:  Body mass index is 39.26 kg/m.  Estimated Nutritional Needs:   Kcal:  1900-2100  Protein:  90-105 grams  Fluid:  1000 ml + UOP    Loistine Chance, RD, LDN, [place] Registered Dietitian II Certified Diabetes Care and Education Specialist Please refer to [facility] for RD and/or RD on-call/weekend/after hours pager

## 2019-08-31 NOTE — Consult Note (Signed)
Chief Complaint: Patient was seen in consultation today for  Chief Complaint  Patient presents with  . GI Bleeding   at the request of [facility]  Referring Physician(s):   Patient Status: [facility] - In-pt  History of Present Illness: Roberta Bryant is a 69 y.o. female with end-stage renal disease on hemodialysis who presented with hematochezia and was admitted on 08/17/2019.  Upper endoscopy performed on 08/15/2019 was negative.  Colonoscopy performed the following day demonstrated bleeding diverticulosis in the left colon.  Additionally, there were multiple small polyps which were cold snare removed.  Patient developed recurrent hematochezia earlier today.  CT arteriogram was performed this evening and demonstrates a small focus of active arterial hemorrhage at the hepatic flexure.  Interventional radiology is now consulted for arteriogram with possible embolization.  Roberta Bryant is currently sleeping in her room.  I was able to wake her.  She is very tired as it has been a busy day.  I explained to her that the CAT scan demonstrated a possible source for her GI bleeding that she has been struggling with this admission.  I explained that we would like to proceed with an angiogram and possible embolization.  She understands and agrees to proceed.  She denies abdominal pain, nausea, vomiting or other symptoms other than fatigue at this time.  Past Medical History:  Diagnosis Date  . Anemia   . Aortic stenosis   . CHF (congestive heart failure) ([place])   . Childhood asthma   . Chronic upper back pain   . Coronary artery disease    Stent to left main coronary artery second of August 2017  . Decreased hearing   . ESRD (end stage renal disease) on dialysis [facility])    "MWF; [facility] in [place]" (01/06/2018)  . Gait abnormality 08/28/2016  . GERD (gastroesophageal reflux disease)   . Headache    "a few times/week" (10/30/2014)  . Headache syndrome 01/08/2017  . Heart murmur   .  History of blood transfusion 1996   "related to menses"  . Hyperlipidemia   . Hypertension   . Hypothyroidism   . IBS (irritable bowel syndrome)   . Myocardial infarction ([place]) 2004  . Osteoarthritis   . Pneumonia    "years ago"  . Rotator cuff arthropathy of right shoulder   . Spondylosis of cervical spine 06/16/2017  . Stroke ([place]) 07/2016   mini stroke , right side of body weak-   . Type II diabetes mellitus ([place])     Past Surgical History:  Procedure Laterality Date  . A/V FISTULAGRAM Left 05/31/2018   Procedure: A/V FISTULAGRAM;  Surgeon: Serafina Mitchell, MD;  Location: [place] CV LAB;  Service: Cardiovascular;  Laterality: Left;  . APPENDECTOMY    . AV FISTULA PLACEMENT Left 12/03/2006   "forearm"  . AV FISTULA REPAIR Left 11/25/2009   "forearm"  . BIOPSY  08/30/2019   Procedure: BIOPSY;  Surgeon: Thornton Park, MD;  Location: [facility] ENDOSCOPY;  Service: Gastroenterology;;  . Wilmon Pali RELEASE Right 02/11/2017   Procedure: RIGHT CARPAL TUNNEL RELEASE;  Surgeon: Daryll Brod, MD;  Location: [place];  Service: Orthopedics;  Laterality: Right;  . CHOLECYSTECTOMY OPEN  1970's  . COLONOSCOPY W/ POLYPECTOMY  07/30/2010   Minimal sigmoid diverticulosis. Small internal hemorrhoids. Otherwise normal colonoscopy to TI.   Marland Kitchen COLONOSCOPY WITH PROPOFOL N/A 09/04/2019   Procedure: COLONOSCOPY WITH PROPOFOL;  Surgeon: Irene Shipper, MD;  Location: [facility] ENDOSCOPY;  Service: Endoscopy;  Laterality: N/A;  .  CORONARY ANGIOPLASTY    . CORONARY ANGIOPLASTY WITH STENT PLACEMENT    . ESOPHAGOGASTRODUODENOSCOPY  10/21/2012   Gastric polyps- status post polypectomy. Mild gastritis.   Marland Kitchen ESOPHAGOGASTRODUODENOSCOPY (EGD) WITH PROPOFOL N/A 09/04/2019   Procedure: ESOPHAGOGASTRODUODENOSCOPY (EGD) WITH PROPOFOL;  Surgeon: Thornton Park, MD;  Location: [place];  Service: Gastroenterology;  Laterality: N/A;  . INSERTION OF DIALYSIS CATHETER Right 2010   "chest"  .  PARATHYROIDECTOMY  2010?   Parathyroid autotransplantation   . POLYPECTOMY  08/21/2019   Procedure: POLYPECTOMY;  Surgeon: Irene Shipper, MD;  Location: [place];  Service: Endoscopy;;  . REVERSE SHOULDER ARTHROPLASTY Right 01/06/2018  . REVERSE SHOULDER ARTHROPLASTY Right 01/06/2018   Procedure: RIGHT REVERSE SHOULDER ARTHROPLASTY;  Surgeon: [name], MD;  Location: [place];  Service: Orthopedics;  Laterality: Right;  . REVISON OF ARTERIOVENOUS FISTULA Left 12/04/2013   Procedure: REVISON OF ARTERIOVENOUS FISTULA;  Surgeon: Rosetta Posner, MD;  Location: [place];  Service: Vascular;  Laterality: Left;  . REVISON OF ARTERIOVENOUS FISTULA Left 07/15/784   Procedure: PLICATION OF LEFT BRACHIOCEPHALIC  ARTERIOVENOUS FISTULA;  Surgeon: Conrad Bergman, MD;  Location: [place];  Service: Vascular;  Laterality: Left;  . SHOULDER ARTHROSCOPY WITH ROTATOR CUFF REPAIR AND SUBACROMIAL DECOMPRESSION Right 06/22/2017   Procedure: RIGHT SHOULDER ARTHROSCOPY WITH EXTENSIVE DEBRIDEMENT;  Surgeon: Mcarthur Rossetti, MD;  Location: [place];  Service: Orthopedics;  Laterality: Right;  . TRIGGER FINGER RELEASE Right 10/22/2017   Procedure: RELEASE RIGHT INDEX TRIGGER FINGER/A-1 PULLEY;  Surgeon: Daryll Brod, MD;  Location: [place];  Service: Orthopedics;  Laterality: Right;  . TUBAL LIGATION  1989  . UMBILICAL HERNIA REPAIR  1990's X 2    Allergies: Sulfa antibiotics, Amoxicillin, Crestor [rosuvastatin calcium], Ibuprofen, Naldecon senior [guaifenesin], Statins, Chlorphen-phenyleph-asa, and Gabapentin  Medications: Prior to Admission medications   Medication Sig Start Date End Date Taking? Authorizing Provider  acetaminophen (TYLENOL) 500 MG tablet Take 500-1,000 mg by mouth every 6 (six) hours as needed (FOR PAIN.).   Yes [provider]  aspirin EC 81 MG tablet Take 81 mg by mouth daily.    Yes [provider]  carvedilol (COREG) 6.25 MG tablet Take 6.25 mg by mouth 2 (two) times daily  with a meal.   Yes [provider]  clonazePAM (KLONOPIN) 0.5 MG tablet TAKE 1 TABLET BY MOUTH AT BEDTIME Patient taking differently: Take 0.5 mg by mouth at bedtime.  05/15/19  Yes Kathrynn Ducking, MD  ezetimibe (ZETIA) 10 MG tablet Take 10 mg by mouth every evening.  05/10/16  Yes [provider]  fenofibrate 160 MG tablet Take 160 mg by mouth every evening.  03/08/17  Yes [provider]  folic acid-vitamin b complex-vitamin c-selenium-zinc (DIALYVITE) 3 MG TABS tablet Take 1 tablet by mouth daily.   Yes [provider]  Lanthanum Carbonate (FOSRENOL) 1000 MG PACK Take 2,000 mg by mouth 3 (three) times daily.    Yes [provider]  levothyroxine (SYNTHROID, LEVOTHROID) 50 MCG tablet Take 50 mcg by mouth daily before breakfast.    Yes [provider]  lidocaine-prilocaine (EMLA) cream Apply 1 application topically See admin instructions. Apply a small amount to access site 1-2 hours as needed before dialysis--cover with occlusive dressing 09/24/17  Yes [provider]  loperamide (IMODIUM A-D) 2 MG tablet Take 2-4 mg by mouth every Monday, Wednesday, and Friday with hemodialysis.    Yes [provider]  nortriptyline (PAMELOR) 10 MG capsule Take 2 capsules (20 mg  total) by mouth at bedtime. 08/14/19  Yes Kathrynn Ducking, MD  omeprazole (PRILOSEC) 20 MG capsule Take 20 mg by mouth in the morning and at bedtime.   Yes [provider]  sitaGLIPtin (JANUVIA) 25 MG tablet Take 25 mg by mouth daily.   Yes [provider]  Vitamin D, Ergocalciferol, (DRISDOL) 1.25 MG (50000 UT) CAPS capsule Take 50,000 Units by mouth every 30 (thirty) days. 04/22/19  Yes [provider]  HYDROcodone-acetaminophen (NORCO/VICODIN) 5-325 MG tablet Take 1-2 tablets by mouth every 6 (six) hours as needed for severe pain. Patient not taking: Reported on 08/23/2019 03/06/19   Joy, Shawn C, PA-C  ondansetron (ZOFRAN ODT) 4 MG  disintegrating tablet Take 1 tablet (4 mg total) by mouth every 8 (eight) hours as needed for nausea or vomiting. Patient not taking: Reported on 05/30/2019 05/18/19   Jackquline Denmark, MD  pantoprazole (PROTONIX) 40 MG tablet Take 1 tablet (40 mg total) by mouth 2 (two) times daily. Patient not taking: Reported on 08/23/2019 05/18/19   Jackquline Denmark, MD     Family History  Problem Relation Age of Onset  . Epilepsy Mother   . Diabetes Mother   . Cancer Mother   . Alcohol abuse Father     Social History   Socioeconomic History  . Marital status: Divorced    Spouse name: Not on file  . Number of children: 4  . Years of education: Some college  . Highest education level: Not on file  Occupational History  . Occupation: Disabled  Tobacco Use  . Smoking status: Former Smoker    Years: 3.00    Types: Cigarettes  . Smokeless tobacco: Never Used  . Tobacco comment: "stopped smoking in the 1970's"  Substance and Sexual Activity  . Alcohol use: No  . Drug use: Not Currently    Types: Marijuana, Hydromorphone    Comment: as a teenager  . Sexual activity: Not Currently  Other Topics Concern  . Not on file  Social History Narrative   Lives at home with sons   Caffeine use: Coffee-decaf   Right-handed   Social Determinants of Health   Financial Resource Strain:   . Difficulty of Paying Living Expenses:   Food Insecurity:   . Worried About Charity fundraiser in the Last Year:   . Arboriculturist in the Last Year:   Transportation Needs:   . Film/video editor (Medical):   Marland Kitchen Lack of Transportation (Non-Medical):   Physical Activity:   . Days of Exercise per Week:   . Minutes of Exercise per Session:   Stress:   . Feeling of Stress :   Social Connections:   . Frequency of Communication with Friends and Family:   . Frequency of Social Gatherings with Friends and Family:   . Attends Religious Services:   . Active Member of Clubs or Organizations:   . Attends Theatre manager Meetings:   Marland Kitchen Marital Status:      Review of Systems: A 12 point ROS discussed and pertinent positives are indicated in the HPI above.  All other systems are negative.  Review of Systems  Vital Signs: BP 123/71   Pulse 97   Temp 98.7 F (37.1 C) (Oral)   Resp 18   Ht 4\' 10"  (1.473 m)   Wt 85.2 kg   SpO2 100%   BMI 39.26 kg/m   Physical Exam Vitals reviewed.  Constitutional:      General: She is  sleeping. She is not in acute distress.    Appearance: Normal appearance. She is obese.  HENT:     Head: Normocephalic and atraumatic.  Eyes:     General: No scleral icterus. Cardiovascular:     Rate and Rhythm: Normal rate.     Pulses: Normal pulses.  Pulmonary:     Effort: Pulmonary effort is normal.  Abdominal:     General: There is no distension.     Palpations: Abdomen is soft.  Skin:    General: Skin is warm and dry.  Neurological:     Mental Status: She is lethargic.     Imaging: CT Head Wo Contrast  Result Date: 08/23/2019 CLINICAL DATA:  Altered mental status. No reported injury. Generalized weakness. End-stage renal disease on dialysis. COVID 19 on 07/15/2019. EXAM: CT HEAD WITHOUT CONTRAST TECHNIQUE: Contiguous axial images were obtained from the base of the skull through the vertex without intravenous contrast. COMPARISON:  06/06/2019 head CT. FINDINGS: Brain: No evidence of parenchymal hemorrhage or extra-axial fluid collection. No mass lesion, mass effect, or midline shift. No CT evidence of acute infarction. Nonspecific moderate subcortical and periventricular white matter hypodensity, most in keeping with chronic small vessel ischemic change. Generalized cerebral volume loss. No ventriculomegaly. Vascular: No acute abnormality. Skull: No evidence of calvarial fracture. Sinuses/Orbits: No fluid levels. Minimal patchy debris and mucoperiosteal thickening in left maxillary sinus. Other: Stable postsurgical change in the right mastoid. The mastoid air  cells are unopacified. IMPRESSION: 1. No evidence of acute intracranial abnormality. 2. Generalized cerebral volume loss and moderate chronic small vessel ischemic changes in the cerebral white matter. Electronically Signed   By: Ilona Sorrel M.D.   On: 08/23/2019 17:58   CT ANGIO PELVIS W OR WO CONTRAST  Result Date: 08/31/2019 CLINICAL DATA:  GI bleeding, recurrent dark burgundy hematochezia this morning, post EGD and colonoscopy, diverticular bleed EXAM: CT ANGIOGRAPHY CHEST, ABDOMEN AND PELVIS TECHNIQUE: Multidetector CT imaging through the chest, abdomen and pelvis was performed using the standard protocol during bolus administration of intravenous contrast. Multiplanar reconstructed images and MIPs were obtained and reviewed to evaluate the vascular anatomy. CONTRAST:  123mL OMNIPAQUE IOHEXOL 350 MG/ML SOLN IV COMPARISON:  CT abdomen and pelvis 04/05/2013 FINDINGS: CTA CHEST FINDINGS Cardiovascular: Atherosclerotic calcifications aorta, proximal great vessels, and coronary arteries. Aorta normal caliber. No aortic aneurysm or dissection. Pulmonary arteries patent on non targeted exam. No pericardial effusion. Mediastinum/Nodes: Esophagus unremarkable.  No thoracic adenopathy. Lungs/Pleura: Lung apices not imaged by this protocol. Small RIGHT pleural effusion and compressive atelectasis of the RIGHT lower lobe. Question minimal patchy infiltrate RIGHT lung. No pneumothorax or definite mass/nodule. Musculoskeletal: RIGHT shoulder prosthesis. Osseous demineralization. No acute osseous findings. Review of the MIP images confirms the above findings. CTA ABDOMEN AND PELVIS FINDINGS VASCULAR Aorta: Normal caliber without aneurysm or dissection. Scattered atherosclerotic calcifications of abdominal aorta. Celiac: Mild plaque at origin of celiac artery. Minimal narrowing, less than 50%. No definite stenosis or thrombus. SMA: Plaque at SMA origin with slightly greater than 50% stenosis. Additional calcified plaque  along the course of the SMA. Renals: Marked plaque at the origins of the renal arteries bilaterally. Renal arteries are diminutive, patient with end-stage renal disease. IMA: Patent though originates at significant plaque with high-grade stenosis of origin Inflow: Calcified plaque and minimal thrombus in the common iliac arteries bilaterally. External iliac arteries patent. Minimal plaque in the internal iliac arteries. Normal caliber iliac vessels. Veins: IVC, iliac veins, common femoral veins, portal vein, SMV and splenic vein  appear patent. Review of the MIP images confirms the above findings. NON-VASCULAR Hepatobiliary: Post cholecystectomy.  Liver unremarkable. Pancreas: Atrophic pancreas without mass Spleen: Normal appearance Adrenals/Urinary Tract: Adrenal glands normal appearance. Markedly atrophic kidneys consistent with end-stage renal disease. Stomach/Bowel: Linear high attenuation focus within the stomach 10 mm diameter on precontrast image 21, question biopsy clip versus ingested foreign body, persists throughout exam. Question polyps versus redundant mucosa along the lesser curvature, recommend correlation with prior EGD. Stomach otherwise unremarkable. Duodenal bulb and sweep normal appearance. Dilated proximal small bowel loops with segment of small bowel wall thickening in the RIGHT mid abdomen question enteritis. Distal small loops beyond this site are decompressed. Cecum and ascending colon normal appearance. Appendix not visualized. Bowel wall thickening of the hepatic flexure of the colon. High attenuation is seen within the lumen of the hepatic flexure on arterial phase (series 13, image 73) not seen on precontrast imaging and increasing on delayed images (series 19, image 45) consistent with a focus of active GI bleeding. Remainder of colon unremarkable. No additional colonic wall thickening or additional sites of active bleeding. Lymphatic: No adenopathy Reproductive: Atrophic uterus with  unremarkable adnexa Other: No free air. Small amount of perihepatic free fluid. Small periumbilical hernia containing fat. Musculoskeletal: Osseous demineralization. Review of the MIP images confirms the above findings. IMPRESSION: Extensive atherosclerotic disease changes without evidence of aortic aneurysm or dissection. Plaque at the origins of the celiac and superior mesenteric arteries with less than 50% narrowing at celiac origin and greater than 50% narrowing at SMA origin. Focus of active GI bleeding at hepatic flexure of colon, which demonstrates wall thickening which could be the result of infection, ischemia, or inflammatory bowel disease. Additional thickened small bowel loop in the LEFT mid abdomen question enteritis, associated with dilatation of small bowel loops proximal to this site suggesting a component of obstruction. Question gastric polyps versus redundant mucosa; recommend correlation with prior endoscopy. Small RIGHT pleural effusion and RIGHT basilar atelectasis. Small amount of nonspecific perihepatic free fluid. Findings called to Dr. Lennox Grumbles on 08/31/2019 at 2048 hrs. Electronically Signed   By: Lavonia Dana M.D.   On: 08/31/2019 20:50   CT ANGIO ABDOMEN W &/OR WO CONTRAST  Result Date: 08/31/2019 CLINICAL DATA:  GI bleeding, recurrent dark burgundy hematochezia this morning, post EGD and colonoscopy, diverticular bleed EXAM: CT ANGIOGRAPHY CHEST, ABDOMEN AND PELVIS TECHNIQUE: Multidetector CT imaging through the chest, abdomen and pelvis was performed using the standard protocol during bolus administration of intravenous contrast. Multiplanar reconstructed images and MIPs were obtained and reviewed to evaluate the vascular anatomy. CONTRAST:  161mL OMNIPAQUE IOHEXOL 350 MG/ML SOLN IV COMPARISON:  CT abdomen and pelvis 04/05/2013 FINDINGS: CTA CHEST FINDINGS Cardiovascular: Atherosclerotic calcifications aorta, proximal great vessels, and coronary arteries. Aorta normal caliber. No  aortic aneurysm or dissection. Pulmonary arteries patent on non targeted exam. No pericardial effusion. Mediastinum/Nodes: Esophagus unremarkable.  No thoracic adenopathy. Lungs/Pleura: Lung apices not imaged by this protocol. Small RIGHT pleural effusion and compressive atelectasis of the RIGHT lower lobe. Question minimal patchy infiltrate RIGHT lung. No pneumothorax or definite mass/nodule. Musculoskeletal: RIGHT shoulder prosthesis. Osseous demineralization. No acute osseous findings. Review of the MIP images confirms the above findings. CTA ABDOMEN AND PELVIS FINDINGS VASCULAR Aorta: Normal caliber without aneurysm or dissection. Scattered atherosclerotic calcifications of abdominal aorta. Celiac: Mild plaque at origin of celiac artery. Minimal narrowing, less than 50%. No definite stenosis or thrombus. SMA: Plaque at SMA origin with slightly greater than 50% stenosis. Additional calcified plaque along  the course of the SMA. Renals: Marked plaque at the origins of the renal arteries bilaterally. Renal arteries are diminutive, patient with end-stage renal disease. IMA: Patent though originates at significant plaque with high-grade stenosis of origin Inflow: Calcified plaque and minimal thrombus in the common iliac arteries bilaterally. External iliac arteries patent. Minimal plaque in the internal iliac arteries. Normal caliber iliac vessels. Veins: IVC, iliac veins, common femoral veins, portal vein, SMV and splenic vein appear patent. Review of the MIP images confirms the above findings. NON-VASCULAR Hepatobiliary: Post cholecystectomy.  Liver unremarkable. Pancreas: Atrophic pancreas without mass Spleen: Normal appearance Adrenals/Urinary Tract: Adrenal glands normal appearance. Markedly atrophic kidneys consistent with end-stage renal disease. Stomach/Bowel: Linear high attenuation focus within the stomach 10 mm diameter on precontrast image 21, question biopsy clip versus ingested foreign body, persists  throughout exam. Question polyps versus redundant mucosa along the lesser curvature, recommend correlation with prior EGD. Stomach otherwise unremarkable. Duodenal bulb and sweep normal appearance. Dilated proximal small bowel loops with segment of small bowel wall thickening in the RIGHT mid abdomen question enteritis. Distal small loops beyond this site are decompressed. Cecum and ascending colon normal appearance. Appendix not visualized. Bowel wall thickening of the hepatic flexure of the colon. High attenuation is seen within the lumen of the hepatic flexure on arterial phase (series 13, image 73) not seen on precontrast imaging and increasing on delayed images (series 19, image 45) consistent with a focus of active GI bleeding. Remainder of colon unremarkable. No additional colonic wall thickening or additional sites of active bleeding. Lymphatic: No adenopathy Reproductive: Atrophic uterus with unremarkable adnexa Other: No free air. Small amount of perihepatic free fluid. Small periumbilical hernia containing fat. Musculoskeletal: Osseous demineralization. Review of the MIP images confirms the above findings. IMPRESSION: Extensive atherosclerotic disease changes without evidence of aortic aneurysm or dissection. Plaque at the origins of the celiac and superior mesenteric arteries with less than 50% narrowing at celiac origin and greater than 50% narrowing at SMA origin. Focus of active GI bleeding at hepatic flexure of colon, which demonstrates wall thickening which could be the result of infection, ischemia, or inflammatory bowel disease. Additional thickened small bowel loop in the LEFT mid abdomen question enteritis, associated with dilatation of small bowel loops proximal to this site suggesting a component of obstruction. Question gastric polyps versus redundant mucosa; recommend correlation with prior endoscopy. Small RIGHT pleural effusion and RIGHT basilar atelectasis. Small amount of nonspecific  perihepatic free fluid. Findings called to Dr. Lennox Grumbles on 08/31/2019 at 2048 hrs. Electronically Signed   By: Lavonia Dana M.D.   On: 08/31/2019 20:50   DG Chest Port 1 View  Result Date: 08/07/2019 CLINICAL DATA:  GI bleeding. EXAM: PORTABLE CHEST 1 VIEW COMPARISON:  Yesterday FINDINGS: Low volume chest with interstitial crowding. Normal heart size for technique. Stable upper mediastinal contours. Postoperative thoracic inlet. Right glenohumeral arthroplasty. IMPRESSION: Stable low volume chest without acute finding. Electronically Signed   By: Monte Fantasia M.D.   On: 08/18/2019 07:51   DG Chest Port 1 View  Result Date: 09/04/2019 CLINICAL DATA:  Pt here from home for black stools with red streaks, weakness, pale, abdominal pain, and hypotension. Dialysis patient. EXAM: PORTABLE CHEST 1 VIEW COMPARISON:  Chest radiograph 08/09/2019 FINDINGS: Stable cardiomediastinal contours. Low lung volumes. Scattered bibasilar opacities likely reflecting atelectasis. No new focal consolidation. No pneumothorax or significant pleural effusion. Status post right shoulder arthroplasty. IMPRESSION: Low lung volumes with bibasilar atelectasis. Electronically Signed   By: Audie Pinto  M.D.   On: 08/26/2019 13:31   DG Chest Port 1 View  Result Date: 08/09/2019 CLINICAL DATA:  COVID, weakness EXAM: PORTABLE CHEST 1 VIEW COMPARISON:  07/19/2019 FINDINGS: Heart is upper limits normal in size. Bibasilar opacities could reflect atelectasis or infiltrates. Right mid lung opacity has resolved. No effusions or edema. No acute bony abnormality. IMPRESSION: Bibasilar opacities could reflect atelectasis or infiltrates. Electronically Signed   By: Rolm Baptise M.D.   On: 08/09/2019 11:56    Labs:  CBC: Recent Labs    08/22/2019 0905 08/18/2019 1815 08/07/2019 0304 08/07/2019 0304 08/30/19 0418 08/30/19 0418 08/30/19 1849 08/31/19 0141 08/31/19 0820 08/31/19 1513  WBC 7.0  --  5.6  --  4.2  --   --  4.8  --   --   HGB  8.6*   < > 7.8*   < > 6.1*   < > 7.5* 7.4* 7.9* 7.1*  HCT 27.3*   < > 24.4*   < > 18.9*   < > 23.0* 23.1* 23.7* 22.1*  PLT 142*  --  153  --  141*  --   --  123*  --   --    < > = values in this interval not displayed.    COAGS: Recent Labs    02/28/19 1146 02/28/19 1146 03/29/19 1721 03/29/19 1721 03/30/19 0233 03/30/19 0233 06/06/19 2203 08/19/2019 1247 08/25/2019 1206 08/30/19 0418  INR 1.2   < > 1.1   < > 1.1   < > 1.2 2.1* 1.5* 1.5*  APTT 29  --  29  --  29  --  27  --   --   --    < > = values in this interval not displayed.    BMP: Recent Labs    08/20/2019 1247 09/05/2019 1321 08/25/2019 0739 08/22/2019 1608 08/30/19 0418 08/31/19 0141  NA 140   < > 139 136 138 136  K 3.6   < > 4.0 4.1 3.7 3.9  CL 103   < > 104 100 103 100  CO2 24  --   --  25 23 24   GLUCOSE 107*   < > 88 86 105* 114*  BUN 20   < > 28* 12 14 7*  CALCIUM 7.0*  --   --  6.7* 7.0* 7.1*  CREATININE 5.43*   < > 6.30* 3.33* 4.53* 2.88*  GFRNONAA 7*  --   --  13* 9* 16*  GFRAA 9*  --   --  16* 11* 19*   < > = values in this interval not displayed.    LIVER FUNCTION TESTS: Recent Labs    06/06/19 2203 06/06/19 2203 07/19/19 1329 07/19/19 1329 08/09/19 1303 08/07/2019 1247 08/09/2019 1608 08/30/19 0418  BILITOT 1.0  --  1.3*  --  1.1 1.4*  --   --   AST 27  --  96*  --  51* 36  --   --   ALT 15  --  27  --  21 17  --   --   ALKPHOS 66  --  52  --  49 50  --   --   PROT 6.4*  --  6.0*  --  5.5* 4.3*  --   --   ALBUMIN 2.8*   < > 2.5*   < > 1.9* 1.5* 1.6* 1.6*   < > = values in this interval not displayed.    TUMOR MARKERS: No results for input(s): AFPTM, CEA,  CA199, CHROMGRNA in the last 8760 hours.  Assessment and Plan:  69 year old female currently admitted for recurrent lower gastrointestinal bleeding requiring multiunit transfusion.  CT arteriogram performed earlier this evening demonstrated a small focus of active hemorrhage in the hepatic flexure.   The risks, benefits and alternatives to  angiogram with possible embolization were explained in detail to the patient.  She has no questions and agrees to proceed with arteriogram.  1.)  To IR for angiogram and possible embolization of bleeding source.  Thank you for this interesting consult.  I greatly enjoyed meeting Roberta Bryant and look forward to participating in their care.  A copy of this report was sent to the requesting provider on this date.  Electronically Signed: Jacqulynn Cadet, MD 08/31/2019, 10:46 PM   I spent a total of 20 Minutes  in face to face in clinical consultation, greater than 50% of which was counseling/coordinating care for acute lower GI bleed.

## 2019-08-31 NOTE — Progress Notes (Addendum)
Daily Rounding Note  08/31/2019, 9:51 AM  LOS: 4 days   SUBJECTIVE:   Chief complaint:   Hematochezia.    Passing thick blood and c/o abdominal cramps this AM.   As I was at bedside around 1015 this morning, she had a recurrent very dark burgundy, liquid/gelatinous stool. Not currently nauseated. HR to 108, BP 89/49, lower than her soft baseline.  No woozy, just chronic fatigue.    OBJECTIVE:         Vital signs in last 24 hours:    Temp:  [97.4 F (36.3 C)-99 F (37.2 C)] 99 F (37.2 C) (03/25 0332) Pulse Rate:  [84-108] 108 (03/25 0332) Resp:  [16-20] 16 (03/24 2017) BP: (92-117)/(32-64) 109/55 (03/25 0332) SpO2:  [96 %-100 %] 100 % (03/25 0332) Weight:  [84.4 kg-85.2 kg] 85.2 kg (03/25 0500) Last BM Date: 08/31/19 Filed Weights   08/30/19 0746 08/30/19 1129 08/31/19 0500  Weight: 83.9 kg 84.4 kg 85.2 kg   General: Chronically unwell, ill-appearing.  Alert. Heart: RRR.  Rate in the 90s. Chest: Clear bilaterally.  No labored breathing or cough. Abdomen: Soft.  Mild left-sided tenderness without guarding or rebound.  Bowel sounds normal quality but hypoactive. Extremities: No CCE. Neuro/Psych: Alert.  Appropriate.  Oriented x3.  Weak but moves all 4 limbs.  No tremors.  Intake/Output from previous day: 03/24 0701 - 03/25 0700 In: 407.5 [P.O.:50; I.V.:42.5; Blood:315] Out: 350   Intake/Output this shift: No intake/output data recorded.  Lab Results: Recent Labs    09/05/2019 0304 08/22/2019 0304 08/30/19 0418 08/30/19 0418 08/30/19 1849 08/31/19 0141 08/31/19 0820  WBC 5.6  --  4.2  --   --  4.8  --   HGB 7.8*   < > 6.1*   < > 7.5* 7.4* 7.9*  HCT 24.4*   < > 18.9*   < > 23.0* 23.1* 23.7*  PLT 153  --  141*  --   --  123*  --    < > = values in this interval not displayed.   BMET Recent Labs    08/19/2019 1608 08/30/19 0418 08/31/19 0141  NA 136 138 136  K 4.1 3.7 3.9  CL 100 103 100  CO2 25 23  24   GLUCOSE 86 105* 114*  BUN 12 14 7*  CREATININE 3.33* 4.53* 2.88*  CALCIUM 6.7* 7.0* 7.1*   LFT Recent Labs    08/20/2019 1608 08/30/19 0418  ALBUMIN 1.6* 1.6*   PT/INR Recent Labs    08/12/2019 1206 08/30/19 0418  LABPROT 18.4* 17.9*  INR 1.5* 1.5*   Scheduled Meds: . calcitRIOL  1 mcg Oral Q M,W,F-HD  . Chlorhexidine Gluconate Cloth  6 each Topical Q0600  . darbepoetin (ARANESP) injection - DIALYSIS  100 mcg Intravenous Q Wed-HD  . feeding supplement (NEPRO CARB STEADY)  237 mL Oral BID BM  . feeding supplement (PRO-STAT SUGAR FREE 64)  30 mL Oral BID  . insulin aspart  0-6 Units Subcutaneous TID WC  . levothyroxine  50 mcg Oral QAC breakfast  . multivitamin  1 tablet Oral QHS  . pantoprazole  40 mg Oral Q0600  . phytonadione  10 mg Oral Daily   Continuous Infusions: . sodium chloride    . sodium chloride     PRN Meds:.sodium chloride, sodium chloride, acetaminophen, heparin, HYDROcodone-acetaminophen, ondansetron (ZOFRAN) IV  ASSESMENT:   *    Recurrent/ongoing diverticular bleeding.  Dark/bloody stools at presentation and currently. 08/07/2019  EGD: A few gastric polyps biopsied: hyperplastic polyp with eroded granulation tissue and surface reactive change.  Mild, nonbleeding, erosive gastropathy biopsied: Mild reactive gastropathy with some surface hyperplastic change, no H. Pylori.  Duodenal nodule biopsied: peptic duodenitis.  Protonix 40/day in place, Prilosec 20/daily, 81 ASA/daily PTA.  .   08/27/2019 colonoscopy.  6 (2 to 3mm) polyps cold snare removed from right and left colon, path:  TSs w/o HGD.  Left colon diverticulosis with fresh blood that was cleared  with irrigation.  Solitary rectal ulcer not biopsied, no stigmata of bleeding, likely stercoral ulcer in pt w constipation.  *    Bleeding from dialysis fistula, resolved.    *    Blood loss anemia.   Hgb 5.4 >> 1 PRBC >> 8.7 >> 6.1 >> 1 PRBC >> 7.5 >> 7.9.  Aranesp in place.  Not iron deficient per  studies 3/22.    *   Thrombocytopenia, 123 K.  Noncritical.  First noted 07/2019  *    Elevated INR, 2.1 on admission >> 1.5 now.  Daily po Vit K in place.     *   ESRD.   MWF hemodialysis.    PLAN   *   Spoke with PA for IR.  Ordered CT angiography of abdomen/pelvis with aim to isolate culprit vessel and perform embolization.  Formal IR consult also placed.    *    Orders in place for H&H q 6 hours x 3.     Roberta Bryant  08/31/2019, 9:51 AM Phone [phone]  GI ATTENDING  We have been reconsulted on this patient for recurrent bleeding.  Agree with interval progress note as outlined above.  Bleeding is diverticular in the left colon based on colonoscopy from 2 days ago.  Also had a rectal ulcer but nonbleeding without stigmata.  Recommend supporting with appropriate blood products and interventional radiology consultation for the purposes of therapeutic vascular embolization.  Docia Chuck. Geri Seminole., M.D. [facility] Division of Gastroenterology

## 2019-08-31 NOTE — Progress Notes (Signed)
[facility] Progress Note   Dialysis Orders: [facility] MWF 3.5 hr EDW 85 400/A 1.5 3 K 2.25 Ca 36 degrees profile 4 var Na 148 linear left upper  AVF heparin 4000 with 1500 mid tmt Mircera 100 q 2 wks -last had 60 on 3/10 calcitriol 1 Binders: fosrenol 2 gm ac  Assessment/Plan: 1. ABLA - secondary to diverticular bleed - s/p colonoscopy with mult tics, 6 polyps removed, linear non bleeding rectal ulcer holding heparin - s/p transfusion; GI following 2. ESRD -  MWF - on HD tolerating well - recent bleeding issues with access - no heparin HD today - currently AP/VP are wnl while on HD with Qb 400 - no bleeding problems yesterday - next HD Wed. Closer exam of access and access arm shows some generalized edema more so than the right.- last f'gram 05/2018.  AF have been variable without intervention over the last year. - Dr. Posey Pronto rec f'gram if not contraindicated due to recent GIB Next HD Friday 3. Hypertension/volume  - BP better today - goal 1.5 Monday with neg UF 900 ml-and 350 ml Wed  keep SBP > 100 post wt 84.4 - 4. Anemia  - due for redose ESA today-  s/p transfusion with repeat  Fe studies high at 92% sat and ferritin 940 - -  hgb  7.8 3/23 >drop  6.1 3/24 - 1 unit PRBC - transfused on HD 3/24- hgb up to 7.4 today 5. Metabolic bone disease -P 1.9 - follow  - on Calcitriol for Ca support I think since hx parathyroidectomy; corrected Ca ok since alb is so low 6. Severe malnutrition - alb severely low   - added prostat- diet advanced to renal 7. Anxeity/depression 8. CAD 9. DM/hypothyroidism - per primary 10.  DNR 11. GOC -for d/c to SNF when medically stable - has two offiers  Myriam Jacobson, PA-C [place] [phone] 08/31/2019,9:08 AM  LOS: 4 days   Subjective:   Seen in room. C/o lower abdominal pain. Having BMs.  Objective Vitals:   08/30/19 1232 08/30/19 2017 08/31/19 0332 08/31/19 0500  BP: 99/64 (!) 95/57 (!) 109/55   Pulse: 88 98 (!) 108    Resp: 17 16    Temp: (!) 97.4 F (36.3 C) 98.7 F (37.1 C) 99 F (37.2 C)   TempSrc: Oral Oral Oral   SpO2: 100% 96% 100%   Weight:    85.2 kg  Height:       Physical Exam General: NAD frail Heart: RRR Lungs: no rales Abdomen: soft obese Extremities: no sig LE edema Dialysis Access: left upper AVF + bruit - some generalized edema of LUE compared to right UE   Additional Objective Labs: Basic Metabolic Panel: Recent Labs  Lab 08/18/2019 1608 08/30/19 0418 08/31/19 0141  NA 136 138 136  K 4.1 3.7 3.9  CL 100 103 100  CO2 25 23 24   GLUCOSE 86 105* 114*  BUN 12 14 7*  CREATININE 3.33* 4.53* 2.88*  CALCIUM 6.7* 7.0* 7.1*  PHOS 1.8* 1.9*  --    Liver Function Tests: Recent Labs  Lab 08/29/2019 1247 08/23/2019 1608 08/30/19 0418  AST 36  --   --   ALT 17  --   --   ALKPHOS 50  --   --   BILITOT 1.4*  --   --   PROT 4.3*  --   --   ALBUMIN 1.5* 1.6* 1.6*   No results for input(s): LIPASE, AMYLASE in  the last 168 hours. CBC: Recent Labs  Lab 08/07/2019 1247 08/19/2019 1321 08/14/2019 0344 09/01/2019 0739 08/12/2019 0905 08/27/2019 1815 08/11/2019 0304 08/26/2019 0304 08/30/19 0418 08/30/19 0418 08/30/19 1849 08/31/19 0141 08/31/19 0820  WBC 5.2   < > 5.9   < > 7.0  --  5.6  --  4.2  --   --  4.8  --   NEUTROABS 3.4  --   --   --   --   --   --   --   --   --   --   --   --   HGB 4.9*   < > 8.3*   < > 8.6*   < > 7.8*   < > 6.1*   < > 7.5* 7.4* 7.9*  HCT 16.0*   < > 25.5*   < > 27.3*   < > 24.4*   < > 18.9*   < > 23.0* 23.1* 23.7*  MCV 127.0*   < > 98.8  --  103.8*  --  102.1*  --  102.7*  --   --  97.9  --   PLT 151   < > 134*   < > 142*  --  153  --  141*  --   --  123*  --    < > = values in this interval not displayed.   Blood Culture    Component Value Date/Time   SDES URINE, CLEAN CATCH 01/04/2018 1524   SPECREQUEST  01/04/2018 1524    NONE Performed at [facility] [address], [place], [place] [address]    CULT MULTIPLE SPECIES PRESENT, SUGGEST  RECOLLECTION (A) 01/04/2018 1524   REPTSTATUS 01/05/2018 FINAL 01/04/2018 1524    Cardiac Enzymes: No results for input(s): CKTOTAL, CKMB, CKMBINDEX, TROPONINI in the last 168 hours. CBG: Recent Labs  Lab 08/30/19 1233 08/30/19 1650 08/30/19 2016 08/31/19 0330 08/31/19 0802  GLUCAP 84 91 85 125* 112*   Iron Studies:  Recent Labs    08/19/2019 1608  IRON 102  TIBC 111*  FERRITIN 940*   Lab Results  Component Value Date   INR 1.5 (H) 08/30/2019   INR 1.5 (H) 08/31/2019   INR 2.1 (H) 08/29/2019   Studies/Results: No results found. Medications: . sodium chloride    . sodium chloride     . calcitRIOL  1 mcg Oral Q M,W,F-HD  . Chlorhexidine Gluconate Cloth  6 each Topical Q0600  . darbepoetin (ARANESP) injection - DIALYSIS  100 mcg Intravenous Q Wed-HD  . feeding supplement (NEPRO CARB STEADY)  237 mL Oral BID BM  . feeding supplement (PRO-STAT SUGAR FREE 64)  30 mL Oral BID  . insulin aspart  0-6 Units Subcutaneous TID WC  . levothyroxine  50 mcg Oral QAC breakfast  . multivitamin  1 tablet Oral QHS  . pantoprazole  40 mg Oral Q0600  . phytonadione  10 mg Oral Daily

## 2019-08-31 NOTE — Progress Notes (Signed)
I was contacted by [facility] night coverage.  [facility] was called with CT abd/pelvis angio results showing active GI bleeding at the hepatic flexure. This is result very likely from diverticular hemorrhage at the hepatic flexure.  Recent colonoscopy with presumed left sided diverticular hemorrhage, but now localization by CTA. Very very unlikely the result of cold polypectomy more than 48 hrs ago.  I recommended a consult now to IR to request embolization given localization of GI bleeding.

## 2019-08-31 NOTE — Progress Notes (Addendum)
PROGRESS NOTE    TIKIA SKILTON  RJJ:[id number] DOB: [birth year] DOA: 08/22/2019 PCP: [name], MD   Brief Narrative: 83 YOF ESRD on HD, hypertension, CAD, diabetes, hypothyroidism presented with acute blood loss anemia in the setting of GI bleed, was seen by GI, nephrology.  Underwent EGD 3/22 nondiagnostic and colonoscopy 3/23-that showed left colon diverticulosis no stigmata of bleeding.   Subjective: Denies any nausea vomiting fever chills. Son at the bedside. Nursing reports patient having abdominal cramps and thick blood blood like bowel movement medium size. Patient again had a second episode of bowel movement melanotic smelling while GI was assessing.  Assessment & Plan:  GIB dark/bloody stool status post EGD 08/2008 colonoscopy 3/23-with mild nonbleeding erosive gastropathy, peptic duodenitis, 6 polyps removed from right colon and left colon and had left colon diverticulosis no stigmata of bleeding.  Pathology shows tubular adenomas no EGD ulcers not biopsied.  Continue Protonix 40 mg daily, GI following closely.  H/H stable.  this am thick blood like BM x2-discussed with GI, and have ordered for IR intervention this morning, will check serial H&H. And transfuse as needed Suspecting diverticular bleed and also incidental large solitary rectal ulcer secondary to chronic constipation to continue to keep her bowels soft with MiraLAX to prevent recurrent constipation  Acute blood loss anemia in the setting of anemia of chronic kidney disease: Hemoglobin as low as 5.4 g-s/p 1 PRBC and increased to 8.6 g, 3/24 am hb 6.1 g, status post 1 unit PRBC in dialysis 3/24 monitor serial h/h. Continue Aranesp, no iron deficiency per study 3/22 Recent Labs  Lab 08/21/2019 0304 08/30/19 0418 08/30/19 1849 08/31/19 0141 08/31/19 0820  HGB 7.8* 6.1* 7.5* 7.4* 7.9*  HCT 24.4* 18.9* 23.0* 23.1* 23.7*   Bleeding from dialysis fistula continue AVF routine care  ESRD on HD MWF -last HD  3/24.Appreciate nephrology input.  LUE aVF pulsatile with LUE larger than RUE ?CV Stenosis- fistulogram has been ordered by nephrology  Thrombocytopenia: stable, monitor Recent Labs  Lab 08/31/2019 0344 08/07/2019 0905 09/03/2019 0304 08/30/19 0418 08/31/19 0141  PLT 134* 142* 153 141* 123*   Elevated INR 2.0 on admission, improved to 1.5.  Continue daily vitamin K.  Hypertension blood pressure soft this morning gave 250 mill bolus with improvement, caution with ivf due to her dialysis unless symptomatic/hypotensive   Metabolic bone disease continue calcitriol.  Anxiety/depression: Mood is stable resume home Klonopin as tolerated  CAD: No chest pain.  Monitor.  Home meds aspirin Coreg on hold in the setting of GI bleed, soft blood pressure  Diabetes mellitus: Hemoglobin A1c 4.9.  Continue sliding scale insulin.  Hold her home Januvia. Recent Labs  Lab 08/30/19 1650 08/30/19 2016 08/31/19 0330 08/31/19 0802 08/31/19 1120  GLUCAP 91 85 125* 112* 100*   Hypothyroidism: Continue home Synthroid  Deconditioning: Continue PT OT likely need a skilled nursing facility placement as suggested by PT.   Addendum; patient blood pressure remained soft hypotensive has received 250 mill bolus NSS x 2 already.  Repeat hemoglobin downtrending 7.1 g, given overall presentation with active bleeding we will transfuse 1 unit PRBC.  Nephrology is okay with giving her 500 ml volume  Nutrition: Diet Order            Diet NPO time specified Except for: Sips with Meds  Diet effective now              Nutrition Problem: Inadequate oral intake Etiology: altered GI function Signs/Symptoms: meal completion < 25%  Interventions: MVI, Prostat, Nepro shake Body mass index is 39.26 kg/m.  Pressure Ulcer:    DVT prophylaxis:SCD-holding chemical prophylaxis due to anemia Code Status:FULL Family Communication: plan of care discussed with patient at bedside. Son at bedside updated. Disposition  Plan: Patient is from:home Anticipated Disposition: to  SNF Barriers to discharge or conditions that needs to be met prior to discharge: Patient admitted with anemia 4.9 underwent EGD and colonoscopy, still hemoglobin low needing intermittent blood transfusion and being monitored,  Last PRBC in dialysis 3/24-continue to have ongoing intermittent rectal bleeding going for IR procedure today. Shewill need SNF once medically stable and signed off by GI.  Consultants:see note  Procedures: 08/24/2019 EGD: A few gastric polyps biopsied: hyperplastic polyp with eroded granulation tissue and surface reactive change.  Mild, nonbleeding, erosive gastropathy biopsied: Mild reactive gastropathy with some surface hyperplastic change, no H. Pylori.  Duodenal nodule biopsied: peptic duodenitis.  Protonix 40/day in place, Prilosec 20/daily, 81 ASA/daily PTA.  .   09/03/2019 colonoscopy.  6 polyps removed located in right and left colon.  Left colon diverticulosis.  Solitary rectal ulcer, no stigmata of bleeding. Pathology shows tubular adenomas, no HGD.  Ulcer not biopsied.   Microbiology:see note   Medications: Scheduled Meds: . calcitRIOL  1 mcg Oral Q M,W,F-HD  . Chlorhexidine Gluconate Cloth  6 each Topical Q0600  . darbepoetin (ARANESP) injection - DIALYSIS  100 mcg Intravenous Q Wed-HD  . feeding supplement (NEPRO CARB STEADY)  237 mL Oral BID BM  . feeding supplement (PRO-STAT SUGAR FREE 64)  30 mL Oral BID  . insulin aspart  0-6 Units Subcutaneous TID WC  . levothyroxine  50 mcg Oral QAC breakfast  . multivitamin  1 tablet Oral QHS  . pantoprazole  40 mg Oral Q0600  . phytonadione  10 mg Oral Daily   Continuous Infusions: . sodium chloride    . sodium chloride      Antimicrobials: Anti-infectives (From admission, onward)   None       Objective: Vitals: Today's Vitals   08/31/19 0826 08/31/19 1000 08/31/19 1101 08/31/19 1124  BP:  (!) 89/49 (!) 90/55 (!) 97/36  Pulse:  (!) 105 97 97   Resp:   17 20  Temp:   98 F (36.7 C) 98.7 F (37.1 C)  TempSrc:    Oral  SpO2:   100% 97%  Weight:      Height:      PainSc: 8        Intake/Output Summary (Last 24 hours) at 08/31/2019 1134 Last data filed at 08/30/2019 2127 Gross per 24 hour  Intake 92.5 ml  Output --  Net 92.5 ml   Filed Weights   08/30/19 0746 08/30/19 1129 08/31/19 0500  Weight: 83.9 kg 84.4 kg 85.2 kg   Weight change: -2.6 kg   Intake/Output from previous day: 03/24 0701 - 03/25 0700 In: 407.5 [P.O.:50; I.V.:42.5; Blood:315] Out: 350  Intake/Output this shift: No intake/output data recorded.  Examination:  General exam: AAO, weak frail, obese. HEENT:Oral mucosa moist, Ear/Nose WNL grossly,dentition normal. Respiratory system: bilaterally clear, non tender. Cardiovascular system: S1 & S2 +, regular, No JVD. Gastrointestinal system: Abdomen soft, NT,ND, BS+. Nervous System:Alert, awake, moving extremities and grossly nonfocal Extremities: No edema, distal peripheral pulses palpable.  Skin: No rashes,no icterus. MSK: Normal muscle bulk,tone, power  Data Reviewed: I have personally reviewed following labs and imaging studies CBC: Recent Labs  Lab 08/24/2019 1247 08/18/2019 1321 08/17/2019 0344 08/22/2019 0739 08/26/2019 0905 09/01/2019 1815  08/22/2019 0304 08/30/19 0418 08/30/19 1849 08/31/19 0141 08/31/19 0820  WBC 5.2   < > 5.9  --  7.0  --  5.6 4.2  --  4.8  --   NEUTROABS 3.4  --   --   --   --   --   --   --   --   --   --   HGB 4.9*   < > 8.3*   < > 8.6*   < > 7.8* 6.1* 7.5* 7.4* 7.9*  HCT 16.0*   < > 25.5*   < > 27.3*   < > 24.4* 18.9* 23.0* 23.1* 23.7*  MCV 127.0*   < > 98.8  --  103.8*  --  102.1* 102.7*  --  97.9  --   PLT 151   < > 134*  --  142*  --  153 141*  --  123*  --    < > = values in this interval not displayed.   Basic Metabolic Panel: Recent Labs  Lab 09/04/2019 1247 08/10/2019 1247 08/11/2019 1321 08/16/2019 0739 08/21/2019 1608 08/30/19 0418 08/31/19 0141  NA 140   < >  139 139 136 138 136  K 3.6   < > 3.5 4.0 4.1 3.7 3.9  CL 103   < > 101 104 100 103 100  CO2 24  --   --   --  _0 GLUCOSE 107*   < > 95 88 86 105* 114*  BUN 20   < > 24* 28* 12 14 7*  CREATININE 5.43*   < > 5.80* 6.30* 3.33* 4.53* 2.88*  CALCIUM 7.0*  --   --   --  6.7* 7.0* 7.1*  MG  --   --   --   --   --  1.6*  --   PHOS  --   --   --   --  1.8* 1.9*  --    < > = values in this interval not displayed.   GFR: Estimated Creatinine Clearance: 17.1 mL/min (A) (by C-G formula based on SCr of 2.88 mg/dL (H)). Liver Function Tests: Recent Labs  Lab 09/01/2019 1247 08/19/2019 1608 08/30/19 0418  AST 36  --   --   ALT 17  --   --   ALKPHOS 50  --   --   BILITOT 1.4*  --   --   PROT 4.3*  --   --   ALBUMIN 1.5* 1.6* 1.6*   No results for input(s): LIPASE, AMYLASE in the last 168 hours. No results for input(s): AMMONIA in the last 168 hours. Coagulation Profile: Recent Labs  Lab 08/07/2019 1247 08/15/2019 1206 08/30/19 0418  INR 2.1* 1.5* 1.5*   Cardiac Enzymes: No results for input(s): CKTOTAL, CKMB, CKMBINDEX, TROPONINI in the last 168 hours. BNP (last 3 results) No results for input(s): PROBNP in the last 8760 hours. HbA1C: No results for input(s): HGBA1C in the last 72 hours. CBG: Recent Labs  Lab 08/30/19 1650 08/30/19 2016 08/31/19 0330 08/31/19 0802 08/31/19 1120  GLUCAP 91 85 125* 112* 100*   Lipid Profile: No results for input(s): CHOL, HDL, LDLCALC, TRIG, CHOLHDL, LDLDIRECT in the last 72 hours. Thyroid Function Tests: No results for input(s): TSH, T4TOTAL, FREET4, T3FREE, THYROIDAB in the last 72 hours. Anemia Panel: Recent Labs    09/01/2019 1608 08/09/2019 1815  VITAMINB12 789  --   FOLATE 4.4*  --   FERRITIN 940*  --   TIBC  111*  --   IRON 102  --   RETICCTPCT  --  3.1   Sepsis Labs: Recent Labs  Lab 08/25/2019 1247  LATICACIDVEN 1.8    Recent Results (from the past 240 hour(s))  Respiratory Panel by RT PCR (Flu A&B, Covid) - Nasopharyngeal  Swab     Status: None   Collection Time: 08/08/2019  3:17 PM   Specimen: Nasopharyngeal Swab  Result Value Ref Range Status   SARS Coronavirus 2 by RT PCR NEGATIVE NEGATIVE Final    Comment: (NOTE) SARS-CoV-2 target nucleic acids are NOT DETECTED. The SARS-CoV-2 RNA is generally detectable in upper respiratoy specimens during the acute phase of infection. The lowest concentration of SARS-CoV-2 viral copies this assay can detect is 131 copies/mL. A negative result does not preclude SARS-Cov-2 infection and should not be used as the sole basis for treatment or other patient management decisions. A negative result may occur with  improper specimen collection/handling, submission of specimen other than nasopharyngeal swab, presence of viral mutation(s) within the areas targeted by this assay, and inadequate number of viral copies (<131 copies/mL). A negative result must be combined with clinical observations, patient history, and epidemiological information. The expected result is Negative. Fact Sheet for Patients:  PinkCheek.be Fact Sheet for Healthcare Providers:  GravelBags.it This test is not yet ap proved or cleared by the [place] FDA and  has been authorized for detection and/or diagnosis of SARS-CoV-2 by FDA under an Emergency Use Authorization (EUA). This EUA will remain  in effect (meaning this test can be used) for the duration of the COVID-19 declaration under Section 564(b)(1) of the Act, 21 U.S.C. section 360bbb-3(b)(1), unless the authorization is terminated or revoked sooner.    Influenza A by PCR NEGATIVE NEGATIVE Final   Influenza B by PCR NEGATIVE NEGATIVE Final    Comment: (NOTE) The Xpert Xpress SARS-CoV-2/FLU/RSV assay is intended as an aid in  the diagnosis of influenza from Nasopharyngeal swab specimens and  should not be used as a sole basis for treatment. Nasal washings and  aspirates are  unacceptable for Xpert Xpress SARS-CoV-2/FLU/RSV  testing. Fact Sheet for Patients: PinkCheek.be Fact Sheet for Healthcare Providers: GravelBags.it This test is not yet approved or cleared by the [place] FDA and  has been authorized for detection and/or diagnosis of SARS-CoV-2 by  FDA under an Emergency Use Authorization (EUA). This EUA will remain  in effect (meaning this test can be used) for the duration of the  Covid-19 declaration under Section 564(b)(1) of the Act, 21  U.S.C. section 360bbb-3(b)(1), unless the authorization is  terminated or revoked. Performed at [facility], [place] [address] [place], [place] [address]   MRSA PCR Screening     Status: None   Collection Time: 08/31/2019  9:21 PM   Specimen: Nasopharyngeal  Result Value Ref Range Status   MRSA by PCR NEGATIVE NEGATIVE Final    Comment:        The GeneXpert MRSA Assay (FDA approved for NASAL specimens only), is one component of a comprehensive MRSA colonization surveillance program. It is not intended to diagnose MRSA infection nor to guide or monitor treatment for MRSA infections. Performed at [facility], [place] [address] [place], [place] [address]       Radiology Studies: No results found.   LOS: 4 days   Time spent: More than 50% of that time was spent in counseling and/or coordination of care.  Antonieta Pert, MD [facility]  08/31/2019,  11:34 AM

## 2019-08-31 NOTE — Plan of Care (Signed)
  Problem: Education: Goal: Knowledge of General Education information will improve Description Including pain rating scale, medication(s)/side effects and non-pharmacologic comfort measures Outcome: Progressing   

## 2019-08-31 NOTE — TOC Progression Note (Signed)
Transition of Care [facility]) - Progression Note    Patient Details  Name: Roberta Bryant MRN: [id number] Date of Birth: February 15, 1951  Transition of Care [facility]) CM/SW [place], [place] Phone Number: 08/31/2019, 1:02 PM  Clinical Narrative:    CSW spoke with pt at bedside. Pt very lethargic today, she did rouse to hear that I was leaving a copy of CMS ratings for 2 SNFs that can offer. Pt not stable for d/c today, told her I would revisit with her tomorrow.    Expected Discharge Plan: [facility] Barriers to Discharge: Continued Medical Work up  Expected Discharge Plan and Services Expected Discharge Plan: [place] In-house Referral: Clinical Social Work Discharge Planning Services: CM Consult Post Acute Care Choice: [place] arrangements for the past 2 months: Single Family Home  Readmission Risk Interventions Readmission Risk Prevention Plan 09/06/2019  Transportation Screening Complete  Medication Review [employer]) Referral to Pharmacy  PCP or Specialist appointment within 3-5 days of discharge Not Complete  PCP/Specialist Appt Not Complete comments plan for poss SNF  [place] or Home Care Consult Complete  SW Recovery Care/Counseling Consult Complete  Palliative Care Screening Not Applicable  Skilled Nursing Facility Complete  Some recent data might be hidden

## 2019-09-01 HISTORY — PX: IR ANGIOGRAM SELECTIVE EACH ADDITIONAL VESSEL: IMG667

## 2019-09-01 HISTORY — PX: IR EMBO ART  VEN HEMORR LYMPH EXTRAV  INC GUIDE ROADMAPPING: IMG5450

## 2019-09-01 HISTORY — PX: IR ANGIOGRAM VISCERAL SELECTIVE: IMG657

## 2019-09-01 HISTORY — PX: IR US GUIDE VASC ACCESS RIGHT: IMG2390

## 2019-09-01 LAB — CBC
HCT: 25.8 % — ABNORMAL LOW (ref 36.0–46.0)
HCT: 26.8 % — ABNORMAL LOW (ref 36.0–46.0)
HCT: 22.9 % — ABNORMAL LOW (ref 36.0–46.0)
HCT: 20.4 % — ABNORMAL LOW (ref 36.0–46.0)
Hemoglobin: 8.2 g/dL — ABNORMAL LOW (ref 12.0–15.0)
Hemoglobin: 8.7 g/dL — ABNORMAL LOW (ref 12.0–15.0)
Hemoglobin: 7.3 g/dL — ABNORMAL LOW (ref 12.0–15.0)
Hemoglobin: 6.6 g/dL — CL (ref 12.0–15.0)
MCH: 31.2 pg (ref 26.0–34.0)
MCH: 31.8 pg (ref 26.0–34.0)
MCH: 31.2 pg (ref 26.0–34.0)
MCH: 32.2 pg (ref 26.0–34.0)
MCHC: 31.8 g/dL (ref 30.0–36.0)
MCHC: 32.5 g/dL (ref 30.0–36.0)
MCHC: 31.9 g/dL (ref 30.0–36.0)
MCHC: 32.4 g/dL (ref 30.0–36.0)
MCV: 98.1 fL (ref 80.0–100.0)
MCV: 97.8 fL (ref 80.0–100.0)
MCV: 97.9 fL (ref 80.0–100.0)
MCV: 99.5 fL (ref 80.0–100.0)
Platelets: 135 10*3/uL — ABNORMAL LOW (ref 150–400)
Platelets: 163 10*3/uL (ref 150–400)
Platelets: 159 10*3/uL (ref 150–400)
Platelets: 114 10*3/uL — ABNORMAL LOW (ref 150–400)
RBC: 2.63 MIL/uL — ABNORMAL LOW (ref 3.87–5.11)
RBC: 2.74 MIL/uL — ABNORMAL LOW (ref 3.87–5.11)
RBC: 2.34 MIL/uL — ABNORMAL LOW (ref 3.87–5.11)
RBC: 2.05 MIL/uL — ABNORMAL LOW (ref 3.87–5.11)
RDW: 23 % — ABNORMAL HIGH (ref 11.5–15.5)
RDW: 23.6 % — ABNORMAL HIGH (ref 11.5–15.5)
RDW: 23.3 % — ABNORMAL HIGH (ref 11.5–15.5)
RDW: 23.2 % — ABNORMAL HIGH (ref 11.5–15.5)
WBC: 4.5 10*3/uL (ref 4.0–10.5)
WBC: 5.5 10*3/uL (ref 4.0–10.5)
WBC: 5.7 10*3/uL (ref 4.0–10.5)
WBC: 5.6 10*3/uL (ref 4.0–10.5)
nRBC: 0.4 % — ABNORMAL HIGH (ref 0.0–0.2)
nRBC: 0.4 % — ABNORMAL HIGH (ref 0.0–0.2)
nRBC: 0.3 % — ABNORMAL HIGH (ref 0.0–0.2)
nRBC: 0.4 % — ABNORMAL HIGH (ref 0.0–0.2)

## 2019-09-01 LAB — BASIC METABOLIC PANEL
Anion gap: 12 (ref 5–15)
BUN: 12 mg/dL (ref 8–23)
CO2: 23 mmol/L (ref 22–32)
Calcium: 7 mg/dL — ABNORMAL LOW (ref 8.9–10.3)
Chloride: 101 mmol/L (ref 98–111)
Creatinine, Ser: 3.77 mg/dL — ABNORMAL HIGH (ref 0.44–1.00)
GFR calc Af Amer: 13 mL/min — ABNORMAL LOW (ref >60)
GFR calc non Af Amer: 12 mL/min — ABNORMAL LOW (ref >60)
Glucose, Bld: 85 mg/dL (ref 70–99)
Potassium: 4.5 mmol/L (ref 3.5–5.1)
Sodium: 136 mmol/L (ref 135–145)

## 2019-09-01 LAB — GLUCOSE, CAPILLARY
Glucose-Capillary: 92 mg/dL (ref 70–99)
Glucose-Capillary: 87 mg/dL (ref 70–99)
Glucose-Capillary: 91 mg/dL (ref 70–99)
Glucose-Capillary: 87 mg/dL (ref 70–99)
Glucose-Capillary: 88 mg/dL (ref 70–99)

## 2019-09-01 LAB — DIC (DISSEMINATED INTRAVASCULAR COAGULATION)PANEL
D-Dimer, Quant: 1.43 ug/mL-FEU — ABNORMAL HIGH (ref 0.00–0.50)
Fibrinogen: 109 mg/dL — ABNORMAL LOW (ref 210–475)
INR: 1.4 — ABNORMAL HIGH (ref 0.8–1.2)
Platelets: 141 10*3/uL — ABNORMAL LOW (ref 150–400)
Prothrombin Time: 17.1 seconds — ABNORMAL HIGH (ref 11.4–15.2)
Smear Review: NONE SEEN
aPTT: 33 seconds (ref 24–36)

## 2019-09-01 MED ORDER — SODIUM CHLORIDE 0.9 % IV SOLN: 100.0000 mL | INTRAVENOUS | Status: DC | PRN

## 2019-09-01 MED ORDER — ALBUMIN HUMAN 25 % IV SOLN
INTRAVENOUS | Status: AC
  Administered 2019-09-01: 25 g via INTRAVENOUS
  Filled 2019-09-01: qty 100

## 2019-09-01 MED ORDER — ALBUMIN HUMAN 25 % IV SOLN: 25.0000 g | Freq: Once | INTRAVENOUS | Status: AC

## 2019-09-01 MED ORDER — LIDOCAINE-PRILOCAINE 2.5-2.5 % EX CREA: 1.0000 "application " | TOPICAL_CREAM | CUTANEOUS | Status: DC | PRN

## 2019-09-01 MED ORDER — SODIUM CHLORIDE 0.9% IV SOLUTION: Freq: Once | INTRAVENOUS | Status: AC

## 2019-09-01 MED ORDER — ALTEPLASE 2 MG IJ SOLR: 2.0000 mg | Freq: Once | INTRAMUSCULAR | Status: DC | PRN

## 2019-09-01 MED ORDER — LIDOCAINE HCL (PF) 1 % IJ SOLN: 5.0000 mL | INTRAMUSCULAR | Status: DC | PRN

## 2019-09-01 MED ORDER — PENTAFLUOROPROP-TETRAFLUOROETH EX AERO: 1.0000 "application " | INHALATION_SPRAY | CUTANEOUS | Status: DC | PRN

## 2019-09-01 NOTE — Progress Notes (Signed)
[facility] Progress Note   Subjective:  Seen in room. S/p IR arteriogram/embolication overnight. No abd pain today but c/o tenesmus. No CP/dyspnea.  Objective Vitals:   09/01/19 0249 09/01/19 0314 09/01/19 0551 09/01/19 0934  BP: (!) 83/51 (!) 94/37 (!) 103/35 95/74  Pulse: 94 98 100 97  Resp: 18  18 17   Temp:   98.6 F (37 C) 98.6 F (37 C)  TempSrc:   Oral Oral  SpO2: 100% 100% 100% 98%  Weight:      Height:       Physical Exam General: Overweight woman, NAD Heart: RRR; no murmur Lungs: CTA anteriorly Abdomen: soft, non tender Extremities: No LE edema Dialysis Access: LUE AVF + thrill  Additional Objective Labs: Basic Metabolic Panel: Recent Labs  Lab 09/06/2019 1608 08/14/2019 1608 08/30/19 0418 08/31/19 0141 09/01/19 0158  NA 136   < > 138 136 136  K 4.1   < > 3.7 3.9 4.5  CL 100   < > 103 100 101  CO2 25   < > 23 24 23   GLUCOSE 86   < > 105* 114* 85  BUN 12   < > 14 7* 12  CREATININE 3.33*   < > 4.53* 2.88* 3.77*  CALCIUM 6.7*   < > 7.0* 7.1* 7.0*  PHOS 1.8*  --  1.9*  --   --    < > = values in this interval not displayed.   Liver Function Tests: Recent Labs  Lab 08/19/2019 1247 08/09/2019 1608 08/30/19 0418  AST 36  --   --   ALT 17  --   --   ALKPHOS 50  --   --   BILITOT 1.4*  --   --   PROT 4.3*  --   --   ALBUMIN 1.5* 1.6* 1.6*   CBC: Recent Labs  Lab 08/15/2019 1247 08/31/2019 1321 08/25/2019 0304 09/05/2019 0304 08/30/19 0418 08/30/19 1849 08/31/19 0141 08/31/19 0820 08/31/19 1513 09/01/19 0158 09/01/19 0804  WBC 5.2   < > 5.6   < > 4.2  --  4.8  --   --  4.5 5.5  NEUTROABS 3.4  --   --   --   --   --   --   --   --   --   --   HGB 4.9*   < > 7.8*   < > 6.1*   < > 7.4*   < > 7.1* 8.2* 8.7*  HCT 16.0*   < > 24.4*   < > 18.9*   < > 23.1*   < > 22.1* 25.8* 26.8*  MCV 127.0*   < > 102.1*  --  102.7*  --  97.9  --   --  98.1 97.8  PLT 151   < > 153   < > 141*  --  123*  --   --  141*  135* 163   < > = values in this interval  not displayed.   Studies/Results: CT ANGIO PELVIS W OR WO CONTRAST  Result Date: 08/31/2019 CLINICAL DATA:  GI bleeding, recurrent dark burgundy hematochezia this morning, post EGD and colonoscopy, diverticular bleed EXAM: CT ANGIOGRAPHY CHEST, ABDOMEN AND PELVIS TECHNIQUE: Multidetector CT imaging through the chest, abdomen and pelvis was performed using the standard protocol during bolus administration of intravenous contrast. Multiplanar reconstructed images and MIPs were obtained and reviewed to evaluate the vascular anatomy. CONTRAST:  179mL OMNIPAQUE IOHEXOL 350 MG/ML SOLN IV COMPARISON:  CT  abdomen and pelvis 04/05/2013 FINDINGS: CTA CHEST FINDINGS Cardiovascular: Atherosclerotic calcifications aorta, proximal great vessels, and coronary arteries. Aorta normal caliber. No aortic aneurysm or dissection. Pulmonary arteries patent on non targeted exam. No pericardial effusion. Mediastinum/Nodes: Esophagus unremarkable.  No thoracic adenopathy. Lungs/Pleura: Lung apices not imaged by this protocol. Small RIGHT pleural effusion and compressive atelectasis of the RIGHT lower lobe. Question minimal patchy infiltrate RIGHT lung. No pneumothorax or definite mass/nodule. Musculoskeletal: RIGHT shoulder prosthesis. Osseous demineralization. No acute osseous findings. Review of the MIP images confirms the above findings. CTA ABDOMEN AND PELVIS FINDINGS VASCULAR Aorta: Normal caliber without aneurysm or dissection. Scattered atherosclerotic calcifications of abdominal aorta. Celiac: Mild plaque at origin of celiac artery. Minimal narrowing, less than 50%. No definite stenosis or thrombus. SMA: Plaque at SMA origin with slightly greater than 50% stenosis. Additional calcified plaque along the course of the SMA. Renals: Marked plaque at the origins of the renal arteries bilaterally. Renal arteries are diminutive, patient with end-stage renal disease. IMA: Patent though originates at significant plaque with high-grade  stenosis of origin Inflow: Calcified plaque and minimal thrombus in the common iliac arteries bilaterally. External iliac arteries patent. Minimal plaque in the internal iliac arteries. Normal caliber iliac vessels. Veins: IVC, iliac veins, common femoral veins, portal vein, SMV and splenic vein appear patent. Review of the MIP images confirms the above findings. NON-VASCULAR Hepatobiliary: Post cholecystectomy.  Liver unremarkable. Pancreas: Atrophic pancreas without mass Spleen: Normal appearance Adrenals/Urinary Tract: Adrenal glands normal appearance. Markedly atrophic kidneys consistent with end-stage renal disease. Stomach/Bowel: Linear high attenuation focus within the stomach 10 mm diameter on precontrast image 21, question biopsy clip versus ingested foreign body, persists throughout exam. Question polyps versus redundant mucosa along the lesser curvature, recommend correlation with prior EGD. Stomach otherwise unremarkable. Duodenal bulb and sweep normal appearance. Dilated proximal small bowel loops with segment of small bowel wall thickening in the RIGHT mid abdomen question enteritis. Distal small loops beyond this site are decompressed. Cecum and ascending colon normal appearance. Appendix not visualized. Bowel wall thickening of the hepatic flexure of the colon. High attenuation is seen within the lumen of the hepatic flexure on arterial phase (series 13, image 73) not seen on precontrast imaging and increasing on delayed images (series 19, image 45) consistent with a focus of active GI bleeding. Remainder of colon unremarkable. No additional colonic wall thickening or additional sites of active bleeding. Lymphatic: No adenopathy Reproductive: Atrophic uterus with unremarkable adnexa Other: No free air. Small amount of perihepatic free fluid. Small periumbilical hernia containing fat. Musculoskeletal: Osseous demineralization. Review of the MIP images confirms the above findings. IMPRESSION: Extensive  atherosclerotic disease changes without evidence of aortic aneurysm or dissection. Plaque at the origins of the celiac and superior mesenteric arteries with less than 50% narrowing at celiac origin and greater than 50% narrowing at SMA origin. Focus of active GI bleeding at hepatic flexure of colon, which demonstrates wall thickening which could be the result of infection, ischemia, or inflammatory bowel disease. Additional thickened small bowel loop in the LEFT mid abdomen question enteritis, associated with dilatation of small bowel loops proximal to this site suggesting a component of obstruction. Question gastric polyps versus redundant mucosa; recommend correlation with prior endoscopy. Small RIGHT pleural effusion and RIGHT basilar atelectasis. Small amount of nonspecific perihepatic free fluid. Findings called to Dr. Lennox Grumbles on 08/31/2019 at 2048 hrs. Electronically Signed   By: Lavonia Dana M.D.   On: 08/31/2019 20:50   CT ANGIO ABDOMEN W &/OR WO  CONTRAST  Result Date: 08/31/2019 CLINICAL DATA:  GI bleeding, recurrent dark burgundy hematochezia this morning, post EGD and colonoscopy, diverticular bleed EXAM: CT ANGIOGRAPHY CHEST, ABDOMEN AND PELVIS TECHNIQUE: Multidetector CT imaging through the chest, abdomen and pelvis was performed using the standard protocol during bolus administration of intravenous contrast. Multiplanar reconstructed images and MIPs were obtained and reviewed to evaluate the vascular anatomy. CONTRAST:  137mL OMNIPAQUE IOHEXOL 350 MG/ML SOLN IV COMPARISON:  CT abdomen and pelvis 04/05/2013 FINDINGS: CTA CHEST FINDINGS Cardiovascular: Atherosclerotic calcifications aorta, proximal great vessels, and coronary arteries. Aorta normal caliber. No aortic aneurysm or dissection. Pulmonary arteries patent on non targeted exam. No pericardial effusion. Mediastinum/Nodes: Esophagus unremarkable.  No thoracic adenopathy. Lungs/Pleura: Lung apices not imaged by this protocol. Small RIGHT pleural  effusion and compressive atelectasis of the RIGHT lower lobe. Question minimal patchy infiltrate RIGHT lung. No pneumothorax or definite mass/nodule. Musculoskeletal: RIGHT shoulder prosthesis. Osseous demineralization. No acute osseous findings. Review of the MIP images confirms the above findings. CTA ABDOMEN AND PELVIS FINDINGS VASCULAR Aorta: Normal caliber without aneurysm or dissection. Scattered atherosclerotic calcifications of abdominal aorta. Celiac: Mild plaque at origin of celiac artery. Minimal narrowing, less than 50%. No definite stenosis or thrombus. SMA: Plaque at SMA origin with slightly greater than 50% stenosis. Additional calcified plaque along the course of the SMA. Renals: Marked plaque at the origins of the renal arteries bilaterally. Renal arteries are diminutive, patient with end-stage renal disease. IMA: Patent though originates at significant plaque with high-grade stenosis of origin Inflow: Calcified plaque and minimal thrombus in the common iliac arteries bilaterally. External iliac arteries patent. Minimal plaque in the internal iliac arteries. Normal caliber iliac vessels. Veins: IVC, iliac veins, common femoral veins, portal vein, SMV and splenic vein appear patent. Review of the MIP images confirms the above findings. NON-VASCULAR Hepatobiliary: Post cholecystectomy.  Liver unremarkable. Pancreas: Atrophic pancreas without mass Spleen: Normal appearance Adrenals/Urinary Tract: Adrenal glands normal appearance. Markedly atrophic kidneys consistent with end-stage renal disease. Stomach/Bowel: Linear high attenuation focus within the stomach 10 mm diameter on precontrast image 21, question biopsy clip versus ingested foreign body, persists throughout exam. Question polyps versus redundant mucosa along the lesser curvature, recommend correlation with prior EGD. Stomach otherwise unremarkable. Duodenal bulb and sweep normal appearance. Dilated proximal small bowel loops with segment of  small bowel wall thickening in the RIGHT mid abdomen question enteritis. Distal small loops beyond this site are decompressed. Cecum and ascending colon normal appearance. Appendix not visualized. Bowel wall thickening of the hepatic flexure of the colon. High attenuation is seen within the lumen of the hepatic flexure on arterial phase (series 13, image 73) not seen on precontrast imaging and increasing on delayed images (series 19, image 45) consistent with a focus of active GI bleeding. Remainder of colon unremarkable. No additional colonic wall thickening or additional sites of active bleeding. Lymphatic: No adenopathy Reproductive: Atrophic uterus with unremarkable adnexa Other: No free air. Small amount of perihepatic free fluid. Small periumbilical hernia containing fat. Musculoskeletal: Osseous demineralization. Review of the MIP images confirms the above findings. IMPRESSION: Extensive atherosclerotic disease changes without evidence of aortic aneurysm or dissection. Plaque at the origins of the celiac and superior mesenteric arteries with less than 50% narrowing at celiac origin and greater than 50% narrowing at SMA origin. Focus of active GI bleeding at hepatic flexure of colon, which demonstrates wall thickening which could be the result of infection, ischemia, or inflammatory bowel disease. Additional thickened small bowel loop in the LEFT mid  abdomen question enteritis, associated with dilatation of small bowel loops proximal to this site suggesting a component of obstruction. Question gastric polyps versus redundant mucosa; recommend correlation with prior endoscopy. Small RIGHT pleural effusion and RIGHT basilar atelectasis. Small amount of nonspecific perihepatic free fluid. Findings called to Dr. Lennox Grumbles on 08/31/2019 at 2048 hrs. Electronically Signed   By: Lavonia Dana M.D.   On: 08/31/2019 20:50   Medications: . sodium chloride    . sodium chloride     . calcitRIOL  1 mcg Oral Q M,W,F-HD  .  Chlorhexidine Gluconate Cloth  6 each Topical Q0600  . darbepoetin (ARANESP) injection - DIALYSIS  100 mcg Intravenous Q Wed-HD  . feeding supplement (NEPRO CARB STEADY)  237 mL Oral BID BM  . feeding supplement (PRO-STAT SUGAR FREE 64)  30 mL Oral BID  . insulin aspart  0-6 Units Subcutaneous TID WC  . levothyroxine  50 mcg Oral QAC breakfast  . multivitamin  1 tablet Oral QHS  . pantoprazole  40 mg Oral Q0600  . phytonadione  10 mg Oral Daily    Dialysis Orders: [facility] MWF 3.5 hr EDW 85 400/A 1.5 3 K 2.25 Ca 36 degrees profile 4 var Na 148 linear left upper AVF heparin 4000 with 1500 mid tmt Mircera 100 q 2 wks -last had 60 on 3/10 calcitriol 1 Binders: fosrenol 2 gm ac  Assessment/Plan: 1. ABLA - secondary to diverticular bleed - s/p colonoscopy with mult tics, 6 polyps removed, linear non bleeding rectal ulcer - s/p transfusion;. Recurred and now s/p IR embolization. 2. ESRD- MWF - for HD later today. 3. AVF issues - recent bleeding issues with access - no heparin HD. Closer exam of access and access arm shows some generalized edema more so than the right.- last f'gram 05/2018.  AF have been variable without intervention over the last year. Dr. Posey Pronto rec f'gram if not contraindicated due to recent GIB 4. Hypotension/volume- BP low/stable. UF as tolerated. 5. Anemia- s/p 3U PRBCs this admit - last 3/25. Continue Aranesp 18mcg q Wed. 6. Metabolic bone disease-Phos low without binders - on Calcitriol for Ca support I think since hx parathyroidectomy; corrected Ca ok since alb is so low 7. Severe malnutrition- alb severely low - continue pro-stat + nepro. 8. Anxeity/depression 9. CAD 10. DM/hypothyroidism- per primary 11. DNR 12. GOC -for d/c to SNF when medically stable - has two offers  Veneta Penton, PA-C 09/01/2019, 11:09 AM  [employer]

## 2019-09-01 NOTE — Progress Notes (Signed)
Referring Physician(s): Dr. Zenovia Jarred  Supervising Physician: Arne Cleveland  Patient Status:  [facility] - In-pt  Chief Complaint: Recurrent GI bleed  Subjective:  "I want coffee."  Patient found resting in bed. She is frustrated that she is not able to eat and drink-- although tech reassures that a tray has been ordered for her.  She reports no further bloody bowel movement.  RN confirms dark, loose stools this AM.   Allergies: Sulfa antibiotics, Amoxicillin, Crestor [rosuvastatin calcium], Ibuprofen, Naldecon senior [guaifenesin], Statins, Chlorphen-phenyleph-asa, and Gabapentin  Medications: Prior to Admission medications   Medication Sig Start Date End Date Taking? Authorizing Provider  acetaminophen (TYLENOL) 500 MG tablet Take 500-1,000 mg by mouth every 6 (six) hours as needed (FOR PAIN.).   Yes [provider]  aspirin EC 81 MG tablet Take 81 mg by mouth daily.    Yes [provider]  carvedilol (COREG) 6.25 MG tablet Take 6.25 mg by mouth 2 (two) times daily with a meal.   Yes [provider]  clonazePAM (KLONOPIN) 0.5 MG tablet TAKE 1 TABLET BY MOUTH AT BEDTIME Patient taking differently: Take 0.5 mg by mouth at bedtime.  05/15/19  Yes Kathrynn Ducking, MD  ezetimibe (ZETIA) 10 MG tablet Take 10 mg by mouth every evening.  05/10/16  Yes [provider]  fenofibrate 160 MG tablet Take 160 mg by mouth every evening.  03/08/17  Yes [provider]  folic acid-vitamin b complex-vitamin c-selenium-zinc (DIALYVITE) 3 MG TABS tablet Take 1 tablet by mouth daily.   Yes [provider]  Lanthanum Carbonate (FOSRENOL) 1000 MG PACK Take 2,000 mg by mouth 3 (three) times daily.    Yes [provider]  levothyroxine (SYNTHROID, LEVOTHROID) 50 MCG tablet Take 50 mcg by mouth daily before breakfast.    Yes [provider]  lidocaine-prilocaine (EMLA) cream Apply 1 application topically See admin instructions. Apply a  small amount to access site 1-2 hours as needed before dialysis--cover with occlusive dressing 09/24/17  Yes [provider]  loperamide (IMODIUM A-D) 2 MG tablet Take 2-4 mg by mouth every Monday, Wednesday, and Friday with hemodialysis.    Yes [provider]  nortriptyline (PAMELOR) 10 MG capsule Take 2 capsules (20 mg total) by mouth at bedtime. 08/14/19  Yes Kathrynn Ducking, MD  omeprazole (PRILOSEC) 20 MG capsule Take 20 mg by mouth in the morning and at bedtime.   Yes [provider]  sitaGLIPtin (JANUVIA) 25 MG tablet Take 25 mg by mouth daily.   Yes [provider]  Vitamin D, Ergocalciferol, (DRISDOL) 1.25 MG (50000 UT) CAPS capsule Take 50,000 Units by mouth every 30 (thirty) days. 04/22/19  Yes [provider]  HYDROcodone-acetaminophen (NORCO/VICODIN) 5-325 MG tablet Take 1-2 tablets by mouth every 6 (six) hours as needed for severe pain. Patient not taking: Reported on 08/23/2019 03/06/19   Joy, Shawn C, PA-C  ondansetron (ZOFRAN ODT) 4 MG disintegrating tablet Take 1 tablet (4 mg total) by mouth every 8 (eight) hours as needed for nausea or vomiting. Patient not taking: Reported on 05/30/2019 05/18/19   Jackquline Denmark, MD  pantoprazole (PROTONIX) 40 MG tablet Take 1 tablet (40 mg total) by mouth 2 (two) times daily. Patient not taking: Reported on 08/23/2019 05/18/19   Jackquline Denmark, MD     Vital Signs: BP (!) (P) 84/49   Pulse (!) (P) 116   Temp (P) 99 F (37.2 C) (Oral)   Resp (!) (P) 22  Ht 4\' 10"  (1.473 m)   Wt (P) 184 lb 1.4 oz (83.5 kg)   SpO2 97%   BMI (P) 38.47 kg/m   Physical Exam  NAD, alert Abdomen: soft, non-tender.  Groin: puncture site intact.  No evidence of pseudoaneurysm or hematoma.  Mildly tender  Imaging: CT ANGIO PELVIS W OR WO CONTRAST  Result Date: 08/31/2019 CLINICAL DATA:  GI bleeding, recurrent dark burgundy hematochezia this morning, post EGD and colonoscopy, diverticular bleed EXAM: CT ANGIOGRAPHY  CHEST, ABDOMEN AND PELVIS TECHNIQUE: Multidetector CT imaging through the chest, abdomen and pelvis was performed using the standard protocol during bolus administration of intravenous contrast. Multiplanar reconstructed images and MIPs were obtained and reviewed to evaluate the vascular anatomy. CONTRAST:  119mL OMNIPAQUE IOHEXOL 350 MG/ML SOLN IV COMPARISON:  CT abdomen and pelvis 04/05/2013 FINDINGS: CTA CHEST FINDINGS Cardiovascular: Atherosclerotic calcifications aorta, proximal great vessels, and coronary arteries. Aorta normal caliber. No aortic aneurysm or dissection. Pulmonary arteries patent on non targeted exam. No pericardial effusion. Mediastinum/Nodes: Esophagus unremarkable.  No thoracic adenopathy. Lungs/Pleura: Lung apices not imaged by this protocol. Small RIGHT pleural effusion and compressive atelectasis of the RIGHT lower lobe. Question minimal patchy infiltrate RIGHT lung. No pneumothorax or definite mass/nodule. Musculoskeletal: RIGHT shoulder prosthesis. Osseous demineralization. No acute osseous findings. Review of the MIP images confirms the above findings. CTA ABDOMEN AND PELVIS FINDINGS VASCULAR Aorta: Normal caliber without aneurysm or dissection. Scattered atherosclerotic calcifications of abdominal aorta. Celiac: Mild plaque at origin of celiac artery. Minimal narrowing, less than 50%. No definite stenosis or thrombus. SMA: Plaque at SMA origin with slightly greater than 50% stenosis. Additional calcified plaque along the course of the SMA. Renals: Marked plaque at the origins of the renal arteries bilaterally. Renal arteries are diminutive, patient with end-stage renal disease. IMA: Patent though originates at significant plaque with high-grade stenosis of origin Inflow: Calcified plaque and minimal thrombus in the common iliac arteries bilaterally. External iliac arteries patent. Minimal plaque in the internal iliac arteries. Normal caliber iliac vessels. Veins: IVC, iliac veins,  common femoral veins, portal vein, SMV and splenic vein appear patent. Review of the MIP images confirms the above findings. NON-VASCULAR Hepatobiliary: Post cholecystectomy.  Liver unremarkable. Pancreas: Atrophic pancreas without mass Spleen: Normal appearance Adrenals/Urinary Tract: Adrenal glands normal appearance. Markedly atrophic kidneys consistent with end-stage renal disease. Stomach/Bowel: Linear high attenuation focus within the stomach 10 mm diameter on precontrast image 21, question biopsy clip versus ingested foreign body, persists throughout exam. Question polyps versus redundant mucosa along the lesser curvature, recommend correlation with prior EGD. Stomach otherwise unremarkable. Duodenal bulb and sweep normal appearance. Dilated proximal small bowel loops with segment of small bowel wall thickening in the RIGHT mid abdomen question enteritis. Distal small loops beyond this site are decompressed. Cecum and ascending colon normal appearance. Appendix not visualized. Bowel wall thickening of the hepatic flexure of the colon. High attenuation is seen within the lumen of the hepatic flexure on arterial phase (series 13, image 73) not seen on precontrast imaging and increasing on delayed images (series 19, image 45) consistent with a focus of active GI bleeding. Remainder of colon unremarkable. No additional colonic wall thickening or additional sites of active bleeding. Lymphatic: No adenopathy Reproductive: Atrophic uterus with unremarkable adnexa Other: No free air. Small amount of perihepatic free fluid. Small periumbilical hernia containing fat. Musculoskeletal: Osseous demineralization. Review of the MIP images confirms the above findings. IMPRESSION: Extensive atherosclerotic disease changes without evidence of aortic aneurysm or dissection. Plaque at the origins  of the celiac and superior mesenteric arteries with less than 50% narrowing at celiac origin and greater than 50% narrowing at SMA  origin. Focus of active GI bleeding at hepatic flexure of colon, which demonstrates wall thickening which could be the result of infection, ischemia, or inflammatory bowel disease. Additional thickened small bowel loop in the LEFT mid abdomen question enteritis, associated with dilatation of small bowel loops proximal to this site suggesting a component of obstruction. Question gastric polyps versus redundant mucosa; recommend correlation with prior endoscopy. Small RIGHT pleural effusion and RIGHT basilar atelectasis. Small amount of nonspecific perihepatic free fluid. Findings called to Dr. Lennox Grumbles on 08/31/2019 at 2048 hrs. Electronically Signed   By: Lavonia Dana M.D.   On: 08/31/2019 20:50   CT ANGIO ABDOMEN W &/OR WO CONTRAST  Result Date: 08/31/2019 CLINICAL DATA:  GI bleeding, recurrent dark burgundy hematochezia this morning, post EGD and colonoscopy, diverticular bleed EXAM: CT ANGIOGRAPHY CHEST, ABDOMEN AND PELVIS TECHNIQUE: Multidetector CT imaging through the chest, abdomen and pelvis was performed using the standard protocol during bolus administration of intravenous contrast. Multiplanar reconstructed images and MIPs were obtained and reviewed to evaluate the vascular anatomy. CONTRAST:  131mL OMNIPAQUE IOHEXOL 350 MG/ML SOLN IV COMPARISON:  CT abdomen and pelvis 04/05/2013 FINDINGS: CTA CHEST FINDINGS Cardiovascular: Atherosclerotic calcifications aorta, proximal great vessels, and coronary arteries. Aorta normal caliber. No aortic aneurysm or dissection. Pulmonary arteries patent on non targeted exam. No pericardial effusion. Mediastinum/Nodes: Esophagus unremarkable.  No thoracic adenopathy. Lungs/Pleura: Lung apices not imaged by this protocol. Small RIGHT pleural effusion and compressive atelectasis of the RIGHT lower lobe. Question minimal patchy infiltrate RIGHT lung. No pneumothorax or definite mass/nodule. Musculoskeletal: RIGHT shoulder prosthesis. Osseous demineralization. No acute osseous  findings. Review of the MIP images confirms the above findings. CTA ABDOMEN AND PELVIS FINDINGS VASCULAR Aorta: Normal caliber without aneurysm or dissection. Scattered atherosclerotic calcifications of abdominal aorta. Celiac: Mild plaque at origin of celiac artery. Minimal narrowing, less than 50%. No definite stenosis or thrombus. SMA: Plaque at SMA origin with slightly greater than 50% stenosis. Additional calcified plaque along the course of the SMA. Renals: Marked plaque at the origins of the renal arteries bilaterally. Renal arteries are diminutive, patient with end-stage renal disease. IMA: Patent though originates at significant plaque with high-grade stenosis of origin Inflow: Calcified plaque and minimal thrombus in the common iliac arteries bilaterally. External iliac arteries patent. Minimal plaque in the internal iliac arteries. Normal caliber iliac vessels. Veins: IVC, iliac veins, common femoral veins, portal vein, SMV and splenic vein appear patent. Review of the MIP images confirms the above findings. NON-VASCULAR Hepatobiliary: Post cholecystectomy.  Liver unremarkable. Pancreas: Atrophic pancreas without mass Spleen: Normal appearance Adrenals/Urinary Tract: Adrenal glands normal appearance. Markedly atrophic kidneys consistent with end-stage renal disease. Stomach/Bowel: Linear high attenuation focus within the stomach 10 mm diameter on precontrast image 21, question biopsy clip versus ingested foreign body, persists throughout exam. Question polyps versus redundant mucosa along the lesser curvature, recommend correlation with prior EGD. Stomach otherwise unremarkable. Duodenal bulb and sweep normal appearance. Dilated proximal small bowel loops with segment of small bowel wall thickening in the RIGHT mid abdomen question enteritis. Distal small loops beyond this site are decompressed. Cecum and ascending colon normal appearance. Appendix not visualized. Bowel wall thickening of the hepatic  flexure of the colon. High attenuation is seen within the lumen of the hepatic flexure on arterial phase (series 13, image 73) not seen on precontrast imaging and increasing on delayed images (series  19, image 45) consistent with a focus of active GI bleeding. Remainder of colon unremarkable. No additional colonic wall thickening or additional sites of active bleeding. Lymphatic: No adenopathy Reproductive: Atrophic uterus with unremarkable adnexa Other: No free air. Small amount of perihepatic free fluid. Small periumbilical hernia containing fat. Musculoskeletal: Osseous demineralization. Review of the MIP images confirms the above findings. IMPRESSION: Extensive atherosclerotic disease changes without evidence of aortic aneurysm or dissection. Plaque at the origins of the celiac and superior mesenteric arteries with less than 50% narrowing at celiac origin and greater than 50% narrowing at SMA origin. Focus of active GI bleeding at hepatic flexure of colon, which demonstrates wall thickening which could be the result of infection, ischemia, or inflammatory bowel disease. Additional thickened small bowel loop in the LEFT mid abdomen question enteritis, associated with dilatation of small bowel loops proximal to this site suggesting a component of obstruction. Question gastric polyps versus redundant mucosa; recommend correlation with prior endoscopy. Small RIGHT pleural effusion and RIGHT basilar atelectasis. Small amount of nonspecific perihepatic free fluid. Findings called to Dr. Lennox Grumbles on 08/31/2019 at 2048 hrs. Electronically Signed   By: Lavonia Dana M.D.   On: 08/31/2019 20:50   IR Angiogram Visceral Selective  Result Date: 09/01/2019 INDICATION: 69 year old female with acute active lower GI bleed. CT arteriogram localizes the bleeding to the hepatic flexure of the colon. EXAM: IR ULTRASOUND GUIDANCE VASC ACCESS RIGHT; ADDITIONAL ARTERIOGRAPHY; SELECTIVE VISCERAL ARTERIOGRAPHY; IR EMBO ART VEN HEMORR  LYMPH EXTRAV INC GUIDE ROADMAPPING 1. Ultrasound-guided vascular access right common femoral artery 2. Celiac catheterization with arteriogram 3. Superior mesenteric artery catheterization with arteriogram 4. Right colic artery with arteriogram 5. Accessory middle colic artery with arteriogram 6. Distal branch of accessory middle colic artery with arteriogram 7. Additional distal branch of accessory middle colic artery with arteriogram 8. Coil embolization MEDICATIONS: None ANESTHESIA/SEDATION: None. CONTRAST:  4mL OMNIPAQUE IOHEXOL 300 MG/ML  SOLN FLUOROSCOPY TIME:  Fluoroscopy Time: 8 minutes 0 seconds (464 mGy). COMPLICATIONS: None immediate. PROCEDURE: Informed consent was obtained from the patient following explanation of the procedure, risks, benefits and alternatives. The patient understands, agrees and consents for the procedure. All questions were addressed. A time out was performed prior to the initiation of the procedure. Maximal barrier sterile technique utilized including caps, mask, sterile gowns, sterile gloves, large sterile drape, hand hygiene, and Betadine prep. The was interrogated with ultrasound and found to be widely patent. An image was obtained and stored for the medical record. Local anesthesia was attained by infiltration with 1% lidocaine. A small dermatotomy was made. Under real-time sonographic guidance, the vessel was punctured with a 21 gauge micropuncture needle. Using standard technique, the initial micro needle was exchanged over a 0.018 micro wire for a transitional 4 [place] micro sheath. The micro sheath was then exchanged over a 0.035 wire for a 5 French vascular sheath. A C2 cobra catheter was advanced over a Bentson wire into the abdominal aorta. The celiac axis was catheterized. Arteriography was performed. Conventional celiac arterial anatomy. No evidence of replaced middle colic artery. No evidence of active hemorrhage. The catheter was next advanced into the superior  mesenteric artery. A superior mesenteric arteriogram was performed. There appears to be a faint focus of contrast extravasation in the region of the hepatic flexure. This appears to represent a watershed zone between the main and an accessory middle colic artery. A renegade ST microcatheter was advanced over a Fathom 16 wire. The first artery selected was the right colic artery. Arteriography  was performed. No evidence of active hemorrhage. The artery was then used to select the accessory middle colic artery. Arteriography was performed. There is a small focus of active hemorrhage in the hepatic flexure. The active hemorrhage appears to be arising from a region of numerous small thread like arteries. In an effort to perform a more selective embolization, the microcatheter was advanced into a more inferior distal branch of the accessory middle colic artery. Arteriography was performed. There is persistent mild hemorrhage. However, there is significant cross flow between the arterial branches. Coil embolization was initiated. This distal accessory branch was successfully coil embolized. The catheter was then advanced into the more superior distal branch of the accessory middle colic artery. There is persistent contrast extravasation. Therefore, coil embolization was performed again. The coil pack was carried back into the main trunk of the accessory middle colic artery. Follow-up arteriography confirms complete stasis. While there is likely some collateral flow from the right were branch of the main middle colic artery, the decision was made not to pursue further embolization for fear of result in ischemia. This embolization should significantly decrease the pressure head to the site of bleeding facilitating healing. The catheters were removed. Hemostasis was attained with the assistance of a 6 French Angio-Seal device. IMPRESSION: 1. Small focus of active arterial extravasation at the hepatic flexure. 2. Successful  coil embolization of an accessory branch of the middle colic artery. Signed, Criselda Peaches, MD, [place] [facility] Radiology Electronically Signed   By: Jacqulynn Cadet M.D.   On: 09/01/2019 12:31   IR Angiogram Selective Each Additional Vessel  Result Date: 09/01/2019 INDICATION: 69 year old female with acute active lower GI bleed. CT arteriogram localizes the bleeding to the hepatic flexure of the colon. EXAM: IR ULTRASOUND GUIDANCE VASC ACCESS RIGHT; ADDITIONAL ARTERIOGRAPHY; SELECTIVE VISCERAL ARTERIOGRAPHY; IR EMBO ART VEN HEMORR LYMPH EXTRAV INC GUIDE ROADMAPPING 1. Ultrasound-guided vascular access right common femoral artery 2. Celiac catheterization with arteriogram 3. Superior mesenteric artery catheterization with arteriogram 4. Right colic artery with arteriogram 5. Accessory middle colic artery with arteriogram 6. Distal branch of accessory middle colic artery with arteriogram 7. Additional distal branch of accessory middle colic artery with arteriogram 8. Coil embolization MEDICATIONS: None ANESTHESIA/SEDATION: None. CONTRAST:  64mL OMNIPAQUE IOHEXOL 300 MG/ML  SOLN FLUOROSCOPY TIME:  Fluoroscopy Time: 8 minutes 0 seconds (464 mGy). COMPLICATIONS: None immediate. PROCEDURE: Informed consent was obtained from the patient following explanation of the procedure, risks, benefits and alternatives. The patient understands, agrees and consents for the procedure. All questions were addressed. A time out was performed prior to the initiation of the procedure. Maximal barrier sterile technique utilized including caps, mask, sterile gowns, sterile gloves, large sterile drape, hand hygiene, and Betadine prep. The was interrogated with ultrasound and found to be widely patent. An image was obtained and stored for the medical record. Local anesthesia was attained by infiltration with 1% lidocaine. A small dermatotomy was made. Under real-time sonographic  guidance, the vessel was punctured with a 21 gauge micropuncture needle. Using standard technique, the initial micro needle was exchanged over a 0.018 micro wire for a transitional 4 [place] micro sheath. The micro sheath was then exchanged over a 0.035 wire for a 5 French vascular sheath. A C2 cobra catheter was advanced over a Bentson wire into the abdominal aorta. The celiac axis was catheterized. Arteriography was performed. Conventional celiac arterial anatomy. No evidence of replaced middle colic artery. No evidence of active hemorrhage. The catheter was  next advanced into the superior mesenteric artery. A superior mesenteric arteriogram was performed. There appears to be a faint focus of contrast extravasation in the region of the hepatic flexure. This appears to represent a watershed zone between the main and an accessory middle colic artery. A renegade ST microcatheter was advanced over a Fathom 16 wire. The first artery selected was the right colic artery. Arteriography was performed. No evidence of active hemorrhage. The artery was then used to select the accessory middle colic artery. Arteriography was performed. There is a small focus of active hemorrhage in the hepatic flexure. The active hemorrhage appears to be arising from a region of numerous small thread like arteries. In an effort to perform a more selective embolization, the microcatheter was advanced into a more inferior distal branch of the accessory middle colic artery. Arteriography was performed. There is persistent mild hemorrhage. However, there is significant cross flow between the arterial branches. Coil embolization was initiated. This distal accessory branch was successfully coil embolized. The catheter was then advanced into the more superior distal branch of the accessory middle colic artery. There is persistent contrast extravasation. Therefore, coil embolization was performed again. The coil pack was carried back into the main  trunk of the accessory middle colic artery. Follow-up arteriography confirms complete stasis. While there is likely some collateral flow from the right were branch of the main middle colic artery, the decision was made not to pursue further embolization for fear of result in ischemia. This embolization should significantly decrease the pressure head to the site of bleeding facilitating healing. The catheters were removed. Hemostasis was attained with the assistance of a 6 French Angio-Seal device. IMPRESSION: 1. Small focus of active arterial extravasation at the hepatic flexure. 2. Successful coil embolization of an accessory branch of the middle colic artery. Signed, Criselda Peaches, MD, [place] [facility] Radiology Electronically Signed   By: Jacqulynn Cadet M.D.   On: 09/01/2019 12:31   IR Angiogram Selective Each Additional Vessel  Result Date: 09/01/2019 INDICATION: 69 year old female with acute active lower GI bleed. CT arteriogram localizes the bleeding to the hepatic flexure of the colon. EXAM: IR ULTRASOUND GUIDANCE VASC ACCESS RIGHT; ADDITIONAL ARTERIOGRAPHY; SELECTIVE VISCERAL ARTERIOGRAPHY; IR EMBO ART VEN HEMORR LYMPH EXTRAV INC GUIDE ROADMAPPING 1. Ultrasound-guided vascular access right common femoral artery 2. Celiac catheterization with arteriogram 3. Superior mesenteric artery catheterization with arteriogram 4. Right colic artery with arteriogram 5. Accessory middle colic artery with arteriogram 6. Distal branch of accessory middle colic artery with arteriogram 7. Additional distal branch of accessory middle colic artery with arteriogram 8. Coil embolization MEDICATIONS: None ANESTHESIA/SEDATION: None. CONTRAST:  53mL OMNIPAQUE IOHEXOL 300 MG/ML  SOLN FLUOROSCOPY TIME:  Fluoroscopy Time: 8 minutes 0 seconds (464 mGy). COMPLICATIONS: None immediate. PROCEDURE: Informed consent was obtained from the patient following explanation of the  procedure, risks, benefits and alternatives. The patient understands, agrees and consents for the procedure. All questions were addressed. A time out was performed prior to the initiation of the procedure. Maximal barrier sterile technique utilized including caps, mask, sterile gowns, sterile gloves, large sterile drape, hand hygiene, and Betadine prep. The was interrogated with ultrasound and found to be widely patent. An image was obtained and stored for the medical record. Local anesthesia was attained by infiltration with 1% lidocaine. A small dermatotomy was made. Under real-time sonographic guidance, the vessel was punctured with a 21 gauge micropuncture needle. Using standard technique, the initial micro needle was exchanged over a  0.018 micro wire for a transitional 4 [place] micro sheath. The micro sheath was then exchanged over a 0.035 wire for a 5 French vascular sheath. A C2 cobra catheter was advanced over a Bentson wire into the abdominal aorta. The celiac axis was catheterized. Arteriography was performed. Conventional celiac arterial anatomy. No evidence of replaced middle colic artery. No evidence of active hemorrhage. The catheter was next advanced into the superior mesenteric artery. A superior mesenteric arteriogram was performed. There appears to be a faint focus of contrast extravasation in the region of the hepatic flexure. This appears to represent a watershed zone between the main and an accessory middle colic artery. A renegade ST microcatheter was advanced over a Fathom 16 wire. The first artery selected was the right colic artery. Arteriography was performed. No evidence of active hemorrhage. The artery was then used to select the accessory middle colic artery. Arteriography was performed. There is a small focus of active hemorrhage in the hepatic flexure. The active hemorrhage appears to be arising from a region of numerous small thread like arteries. In an effort to perform a more  selective embolization, the microcatheter was advanced into a more inferior distal branch of the accessory middle colic artery. Arteriography was performed. There is persistent mild hemorrhage. However, there is significant cross flow between the arterial branches. Coil embolization was initiated. This distal accessory branch was successfully coil embolized. The catheter was then advanced into the more superior distal branch of the accessory middle colic artery. There is persistent contrast extravasation. Therefore, coil embolization was performed again. The coil pack was carried back into the main trunk of the accessory middle colic artery. Follow-up arteriography confirms complete stasis. While there is likely some collateral flow from the right were branch of the main middle colic artery, the decision was made not to pursue further embolization for fear of result in ischemia. This embolization should significantly decrease the pressure head to the site of bleeding facilitating healing. The catheters were removed. Hemostasis was attained with the assistance of a 6 French Angio-Seal device. IMPRESSION: 1. Small focus of active arterial extravasation at the hepatic flexure. 2. Successful coil embolization of an accessory branch of the middle colic artery. Signed, Criselda Peaches, MD, [place] [facility] Electronically Signed   By: Jacqulynn Cadet M.D.   On: 09/01/2019 12:31   IR US Guide Vasc Access Right  Result Date: 09/01/2019 INDICATION: 68 year old female with acute active lower GI bleed. CT arteriogram localizes the bleeding to the hepatic flexure of the colon. EXAM: IR ULTRASOUND GUIDANCE VASC ACCESS RIGHT; ADDITIONAL ARTERIOGRAPHY; SELECTIVE VISCERAL ARTERIOGRAPHY; IR EMBO ART VEN HEMORR LYMPH EXTRAV INC GUIDE ROADMAPPING 1. Ultrasound-guided vascular access right common femoral artery 2. Celiac catheterization with arteriogram 3. Superior  mesenteric artery catheterization with arteriogram 4. Right colic artery with arteriogram 5. Accessory middle colic artery with arteriogram 6. Distal branch of accessory middle colic artery with arteriogram 7. Additional distal branch of accessory middle colic artery with arteriogram 8. Coil embolization MEDICATIONS: None ANESTHESIA/SEDATION: None. CONTRAST:  61mL OMNIPAQUE IOHEXOL 300 MG/ML  SOLN FLUOROSCOPY TIME:  Fluoroscopy Time: 8 minutes 0 seconds (464 mGy). COMPLICATIONS: None immediate. PROCEDURE: Informed consent was obtained from the patient following explanation of the procedure, risks, benefits and alternatives. The patient understands, agrees and consents for the procedure. All questions were addressed. A time out was performed prior to the initiation of the procedure. Maximal barrier sterile technique utilized including caps, mask, sterile gowns, sterile gloves, large  sterile drape, hand hygiene, and Betadine prep. The was interrogated with ultrasound and found to be widely patent. An image was obtained and stored for the medical record. Local anesthesia was attained by infiltration with 1% lidocaine. A small dermatotomy was made. Under real-time sonographic guidance, the vessel was punctured with a 21 gauge micropuncture needle. Using standard technique, the initial micro needle was exchanged over a 0.018 micro wire for a transitional 4 [place] micro sheath. The micro sheath was then exchanged over a 0.035 wire for a 5 French vascular sheath. A C2 cobra catheter was advanced over a Bentson wire into the abdominal aorta. The celiac axis was catheterized. Arteriography was performed. Conventional celiac arterial anatomy. No evidence of replaced middle colic artery. No evidence of active hemorrhage. The catheter was next advanced into the superior mesenteric artery. A superior mesenteric arteriogram was performed. There appears to be a faint focus of contrast extravasation in the region of the hepatic  flexure. This appears to represent a watershed zone between the main and an accessory middle colic artery. A renegade ST microcatheter was advanced over a Fathom 16 wire. The first artery selected was the right colic artery. Arteriography was performed. No evidence of active hemorrhage. The artery was then used to select the accessory middle colic artery. Arteriography was performed. There is a small focus of active hemorrhage in the hepatic flexure. The active hemorrhage appears to be arising from a region of numerous small thread like arteries. In an effort to perform a more selective embolization, the microcatheter was advanced into a more inferior distal branch of the accessory middle colic artery. Arteriography was performed. There is persistent mild hemorrhage. However, there is significant cross flow between the arterial branches. Coil embolization was initiated. This distal accessory branch was successfully coil embolized. The catheter was then advanced into the more superior distal branch of the accessory middle colic artery. There is persistent contrast extravasation. Therefore, coil embolization was performed again. The coil pack was carried back into the main trunk of the accessory middle colic artery. Follow-up arteriography confirms complete stasis. While there is likely some collateral flow from the right were branch of the main middle colic artery, the decision was made not to pursue further embolization for fear of result in ischemia. This embolization should significantly decrease the pressure head to the site of bleeding facilitating healing. The catheters were removed. Hemostasis was attained with the assistance of a 6 French Angio-Seal device. IMPRESSION: 1. Small focus of active arterial extravasation at the hepatic flexure. 2. Successful coil embolization of an accessory branch of the middle colic artery. Signed, Criselda Peaches, MD, [place] [facility]  [facility] Electronically Signed   By: Jacqulynn Cadet M.D.   On: 09/01/2019 12:31   IR EMBO ART  VEN HEMORR LYMPH EXTRAV  INC GUIDE ROADMAPPING  Result Date: 09/01/2019 INDICATION: 69 year old female with acute active lower GI bleed. CT arteriogram localizes the bleeding to the hepatic flexure of the colon. EXAM: IR ULTRASOUND GUIDANCE VASC ACCESS RIGHT; ADDITIONAL ARTERIOGRAPHY; SELECTIVE VISCERAL ARTERIOGRAPHY; IR EMBO ART VEN HEMORR LYMPH EXTRAV INC GUIDE ROADMAPPING 1. Ultrasound-guided vascular access right common femoral artery 2. Celiac catheterization with arteriogram 3. Superior mesenteric artery catheterization with arteriogram 4. Right colic artery with arteriogram 5. Accessory middle colic artery with arteriogram 6. Distal branch of accessory middle colic artery with arteriogram 7. Additional distal branch of accessory middle colic artery with arteriogram 8. Coil embolization MEDICATIONS: None ANESTHESIA/SEDATION: None. CONTRAST:  54mL OMNIPAQUE IOHEXOL  300 MG/ML  SOLN FLUOROSCOPY TIME:  Fluoroscopy Time: 8 minutes 0 seconds (464 mGy). COMPLICATIONS: None immediate. PROCEDURE: Informed consent was obtained from the patient following explanation of the procedure, risks, benefits and alternatives. The patient understands, agrees and consents for the procedure. All questions were addressed. A time out was performed prior to the initiation of the procedure. Maximal barrier sterile technique utilized including caps, mask, sterile gowns, sterile gloves, large sterile drape, hand hygiene, and Betadine prep. The was interrogated with ultrasound and found to be widely patent. An image was obtained and stored for the medical record. Local anesthesia was attained by infiltration with 1% lidocaine. A small dermatotomy was made. Under real-time sonographic guidance, the vessel was punctured with a 21 gauge micropuncture needle. Using standard technique, the initial micro needle was exchanged over a  0.018 micro wire for a transitional 4 [place] micro sheath. The micro sheath was then exchanged over a 0.035 wire for a 5 French vascular sheath. A C2 cobra catheter was advanced over a Bentson wire into the abdominal aorta. The celiac axis was catheterized. Arteriography was performed. Conventional celiac arterial anatomy. No evidence of replaced middle colic artery. No evidence of active hemorrhage. The catheter was next advanced into the superior mesenteric artery. A superior mesenteric arteriogram was performed. There appears to be a faint focus of contrast extravasation in the region of the hepatic flexure. This appears to represent a watershed zone between the main and an accessory middle colic artery. A renegade ST microcatheter was advanced over a Fathom 16 wire. The first artery selected was the right colic artery. Arteriography was performed. No evidence of active hemorrhage. The artery was then used to select the accessory middle colic artery. Arteriography was performed. There is a small focus of active hemorrhage in the hepatic flexure. The active hemorrhage appears to be arising from a region of numerous small thread like arteries. In an effort to perform a more selective embolization, the microcatheter was advanced into a more inferior distal branch of the accessory middle colic artery. Arteriography was performed. There is persistent mild hemorrhage. However, there is significant cross flow between the arterial branches. Coil embolization was initiated. This distal accessory branch was successfully coil embolized. The catheter was then advanced into the more superior distal branch of the accessory middle colic artery. There is persistent contrast extravasation. Therefore, coil embolization was performed again. The coil pack was carried back into the main trunk of the accessory middle colic artery. Follow-up arteriography confirms complete stasis. While there is likely some collateral flow from the  right were branch of the main middle colic artery, the decision was made not to pursue further embolization for fear of result in ischemia. This embolization should significantly decrease the pressure head to the site of bleeding facilitating healing. The catheters were removed. Hemostasis was attained with the assistance of a 6 French Angio-Seal device. IMPRESSION: 1. Small focus of active arterial extravasation at the hepatic flexure. 2. Successful coil embolization of an accessory branch of the middle colic artery. Signed, Criselda Peaches, MD, [place] [facility] Electronically Signed   By: Jacqulynn Cadet M.D.   On: 09/01/2019 12:31    Labs:  CBC: Recent Labs    08/31/19 0141 08/31/19 0820 08/31/19 1513 09/01/19 0158 09/01/19 0804 09/01/19 1500  WBC 4.8  --   --  4.5 5.5 5.7  HGB 7.4*   < > 7.1* 8.2* 8.7* 7.3*  HCT 23.1*   < > 22.1*  25.8* 26.8* 22.9*  PLT 123*  --   --  141*  135* 163 159   < > = values in this interval not displayed.    COAGS: Recent Labs    03/29/19 1721 03/29/19 1721 03/30/19 0233 03/30/19 0233 06/06/19 2203 06/06/19 2203 09/01/2019 1247 09/04/2019 1206 08/30/19 0418 09/01/19 0158  INR 1.1   < > 1.1   < > 1.2   < > 2.1* 1.5* 1.5* 1.4*  APTT 29  --  29  --  27  --   --   --   --  33   < > = values in this interval not displayed.    BMP: Recent Labs    08/19/2019 1608 08/30/19 0418 08/31/19 0141 09/01/19 0158  NA 136 138 136 136  K 4.1 3.7 3.9 4.5  CL 100 103 100 101  CO2 25 23 24 23   GLUCOSE 86 105* 114* 85  BUN 12 14 7* 12  CALCIUM 6.7* 7.0* 7.1* 7.0*  CREATININE 3.33* 4.53* 2.88* 3.77*  GFRNONAA 13* 9* 16* 12*  GFRAA 16* 11* 19* 13*    LIVER FUNCTION TESTS: Recent Labs    06/06/19 2203 06/06/19 2203 07/19/19 1329 07/19/19 1329 08/09/19 1303 08/26/2019 1247 08/28/19 1608 08/30/19 0418  BILITOT 1.0  --  1.3*  --  1.1 1.4*  --   --   AST 27  --  96*  --  51* 36  --   --    ALT 15  --  27  --  21 17  --   --   ALKPHOS 66  --  52  --  49 50  --   --   PROT 6.4*  --  6.0*  --  5.5* 4.3*  --   --   ALBUMIN 2.8*   < > 2.5*   < > 1.9* 1.5* 1.6* 1.6*   < > = values in this interval not displayed.    Assessment and Plan: Recurrent GI bleed, suspected diverticular Patient s/p embolization after CT arteriogram showed localized bleed to the hepatic flexure of the colon.  Patient assessed at bedside today. Denies belly pain. Reports improvement in her bloody stools.  VSS HgB 8.2. SCr 3.7, on HD.  Patient has not yet advanced diet.  IR to follow for diet advancement and tolerance.  Monitor for signs and symptoms of ischemic bowel.   LUE swelling Patient with LUE swelling at the level of the hand and wrist.  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  There is a mild amount of bruising at the access site.  IR consulted for fistulagram.  Currently working and receiving regular dialysis.  Will plan to reassess for fistualgram needs once stable.   Electronically Signed: Docia Barrier, PA 09/01/2019, 4:11 PM   I spent a total of 15 Minutes at the the patient's bedside AND on the patient's hospital floor or unit, greater than 50% of which was counseling/coordinating care for GI bleed.

## 2019-09-01 NOTE — Progress Notes (Signed)
PT Cancellation Note  Patient Details Name: Roberta Bryant MRN: [id number] DOB: 1950/12/05   Cancelled Treatment:    Reason Eval/Treat Not Completed: Patient at procedure or test/unavailable (HD). PT will continue to f/u with pt acutely as available.    Clearnce Sorrel Derek Laughter 09/01/2019, 2:39 PM

## 2019-09-01 NOTE — Procedures (Signed)
Interventional Radiology Procedure Note  Procedure: Visceral arteriogram and embolization.   Complications: None  Estimated Blood Loss: None  Recommendations: - Bedrest with leg straight x 4 hrs - Trend H&H, transfuse as needed.   Signed,  Criselda Peaches, MD

## 2019-09-01 NOTE — Progress Notes (Signed)
Nutrition Follow-up  DOCUMENTATION CODES:   Obesity unspecified  INTERVENTION:   -Continue 30 ml Prostat BID, each supplement provides 100 kcals and 15 grams protein -Continue renal MVI daily -Continue Nepro Shake poBID, each supplement provides 425 kcal and 19 grams protein  NUTRITION DIAGNOSIS:   Inadequate oral intake related to altered GI function as evidenced by meal completion < 25%.  Ongoing  GOAL:   Patient will meet greater than or equal to 90% of their needs  Progressing   MONITOR:   PO intake, Supplement acceptance, Diet advancement, Labs, Weight trends, Skin, I & O's  REASON FOR ASSESSMENT:   Malnutrition Screening Tool    ASSESSMENT:   69 y/o F who presented to [facility] on 3/21 with reports of dark stools and altered mental status.  3/22- s/p upper endoscopy- revealed gastric polyps (biopsied), mild erosive gastropathy with no bleeding (biopsied), nodule in duodenum (biopsied) 3/23- s/p colonoscopy  3/25- s/p Visceral arteriogram and embolization  Reviewed I/O's: +495 ml x 24 hours and +2.1 L since admission  Spoke with pt at bedside, who had flat affect and did not interact much with this RD. She reports chronically poor appetite over the past 3 years. She reports she always consumes 3 meals per day, but often does not eat much (she provides example that she only consumes a cup of coffee to breakfast every morning). Pt reports she does not prefer cold fluids with ice- offered to replace her beverage.   Conversation cut short, due to pt requesting help due to having a BM.   Pt with variable acceptance of supplements, but accepted Nepro and Prostat today.   Per CSW notes, plan to d/c to SNF once medically stable.   Labs reviewed: CBGS: 87-92 (inpatient orders for glycemic control are 0-6 units insulin aspart TID with meals).   NUTRITION - FOCUSED PHYSICAL EXAM:    Most Recent Value  Orbital Region  No depletion  Upper Arm Region  Mild depletion   Thoracic and Lumbar Region  No depletion  Buccal Region  No depletion  Temple Region  No depletion  Clavicle Bone Region  No depletion  Clavicle and Acromion Bone Region  No depletion  Scapular Bone Region  No depletion  Dorsal Hand  No depletion  Patellar Region  Unable to assess  Anterior Thigh Region  Unable to assess  Posterior Calf Region  Unable to assess  Edema (RD Assessment)  Mild  Hair  Reviewed  Eyes  Reviewed  Mouth  Reviewed  Skin  Reviewed  Nails  Reviewed       Diet Order:   Diet Order            Diet Carb Modified Fluid consistency: Thin; Room service appropriate? Yes  Diet effective now              EDUCATION NEEDS:   No education needs have been identified at this time  Skin:  Skin Assessment: Reviewed RN Assessment  Last BM:  09/01/19  Height:   Ht Readings from Last 1 Encounters:  08/31/2019 4\' 10"  (1.473 m)    Weight:   Wt Readings from Last 1 Encounters:  08/31/19 85.2 kg    Ideal Body Weight:  44.1 kg  BMI:  Body mass index is 39.26 kg/m.  Estimated Nutritional Needs:   Kcal:  1900-2100  Protein:  90-105 grams  Fluid:  1000 ml + UOP    Loistine Chance, RD, LDN, [place] Registered Dietitian II Certified Diabetes Care and Education  Specialist Please refer to [facility] for RD and/or RD on-call/weekend/after hours pager

## 2019-09-01 NOTE — Progress Notes (Signed)
PROGRESS NOTE    VERNEICE CASPERS  GBT:[id number] DOB: May 13, 1951 DOA: 09/06/2019 PCP: [name], MD   Brief Narrative: 16 YOF ESRD on HD, hypertension, CAD, diabetes, hypothyroidism presented with acute blood loss anemia in the setting of GI bleed, was seen by GI, nephrology.  Underwent EGD 3/22 nondiagnostic and colonoscopy 3/23-that showed left colon diverticulosis no stigmata of bleeding.   Subjective:  Seen this morning having her meal.  Complains of tenesmus. Had IR embolization last night.  Assessment & Plan:  Recurrent GI bleed most likely diverticular :status post EGD 08/2008 colonoscopy 3/23-with mild nonbleeding erosive gastropathy, peptic duodenitis, 6 polyps removed from right colon and left colon and had left colon diverticulosis no stigmata of bleeding.  Pathology shows tubular adenomas no EGD ulcers not biopsied.  Was hypotensive and anemic with rectal bleeding 3/25 underwent CTA that showed active bleeding at hepatic flexure, and is status post arteriogram and embolization 3/25.  Monitor H&H continue MiraLAX PPI  Acute blood loss anemia in the setting of anemia of chronic kidney disease: Hemoglobin as low as 5.4 g-s/p 1 PRBC and increased to 8.6 g, 3/24 am hb 6.1 g, status post 1 unit PRBC in dialysis 3/24 -because of rectal bleeding and hypotension received 1 unit PRBC 3/25.  Continue Aranesp, no iron deficiency per study 3/22 Recent Labs  Lab 08/31/19 0141 08/31/19 0820 08/31/19 1513 09/01/19 0158 09/01/19 0804  HGB 7.4* 7.9* 7.1* 8.2* 8.7*  HCT 23.1* 23.7* 22.1* 25.8* 26.8*   Bleeding from dialysis fistula continue AVF routine care  ESRD on HD MWF -last HD 3/24.HD again today.  Appreciate nephrology input.  LUE aVF pulsatile with LUE larger than RUE ?CV Stenosis- fistulogram has been ordered by nephrology  Thrombocytopenia: stable, monitor Recent Labs  Lab 08/15/2019 0304 08/30/19 0418 08/31/19 0141 09/01/19 0158 09/01/19 0804  PLT 153 141* 123* 141*   135* 163   Elevated INR 2.0 on admission, improved to 1.5.  Continue daily vitamin K.  Hypertension: Needed IV fluid boluses and blood transfusion 3/25.  Blood pressure stable today.  Monitor.   Metabolic bone disease continue calcitriol.  Anxiety/depression: Mood is stable resume home Klonopin as tolerated  CAD: No chest pain.  Monitor.  Home meds aspirin Coreg on hold in the setting of GI bleed, soft blood pressure  Diabetes mellitus: Hemoglobin A1c 4.9.  Continue sliding scale insulin.  Hold her home Januvia. Recent Labs  Lab 08/31/19 1215 08/31/19 1716 08/31/19 2037 09/01/19 0439 09/01/19 0801  GLUCAP 99 103* 88 92 87   Hypothyroidism: Continue home Synthroid  Deconditioning: Continue PT OT likely need a skilled nursing facility placement as suggested by PT.   Nutrition: Diet Order            Diet Carb Modified Fluid consistency: Thin; Room service appropriate? Yes  Diet effective now              Nutrition Problem: Inadequate oral intake Etiology: altered GI function Signs/Symptoms: meal completion < 25% Interventions: MVI, Prostat, Nepro shake Body mass index is 39.26 kg/m.  Pressure Ulcer:    DVT prophylaxis:SCD-holding chemical prophylaxis due to anemia Code Status:FULL Family Communication: plan of care discussed with patient at bedside. Son at bedside was updated. Disposition Plan: Patient is from:home Anticipated Disposition: to  SNF Barriers to discharge or conditions that needs to be met prior to discharge: Patient admitted with anemia 4.9 underwent EGD and colonoscopy, with ongoing rectal bleeding underwent IR embolization of the active bleeding and hepatic flexure, for  dialysis today and plan for skilled nursing facility once okay with GI and nephrology hopefully next 1 to 2 days.    Consultants:see note  Procedures: 08/31/2019 EGD: A few gastric polyps biopsied: hyperplastic polyp with eroded granulation tissue and surface reactive change.  Mild,  nonbleeding, erosive gastropathy biopsied: Mild reactive gastropathy with some surface hyperplastic change, no H. Pylori.  Duodenal nodule biopsied: peptic duodenitis.  Protonix 40/day in place, Prilosec 20/daily, 81 ASA/daily PTA.  .   08/30/2019 colonoscopy.  6 polyps removed located in right and left colon.  Left colon diverticulosis.  Solitary rectal ulcer, no stigmata of bleeding. Pathology shows tubular adenomas, no HGD.  Ulcer not biopsied.   3/25 IR arteriogram with bleeding in the hepatic flexure and s/p embolization  Microbiology:see note  Medications: Scheduled Meds: . calcitRIOL  1 mcg Oral Q M,W,F-HD  . Chlorhexidine Gluconate Cloth  6 each Topical Q0600  . darbepoetin (ARANESP) injection - DIALYSIS  100 mcg Intravenous Q Wed-HD  . feeding supplement (NEPRO CARB STEADY)  237 mL Oral BID BM  . feeding supplement (PRO-STAT SUGAR FREE 64)  30 mL Oral BID  . insulin aspart  0-6 Units Subcutaneous TID WC  . levothyroxine  50 mcg Oral QAC breakfast  . multivitamin  1 tablet Oral QHS  . pantoprazole  40 mg Oral Q0600  . phytonadione  10 mg Oral Daily   Continuous Infusions: . sodium chloride    . sodium chloride      Antimicrobials: Anti-infectives (From admission, onward)   None       Objective: Vitals: Today's Vitals   09/01/19 0314 09/01/19 0551 09/01/19 0856 09/01/19 0934  BP: (!) 94/37 (!) 103/35  95/74  Pulse: 98 100  97  Resp:  18  17  Temp:  98.6 F (37 C)  98.6 F (37 C)  TempSrc:  Oral  Oral  SpO2: 100% 100%  98%  Weight:      Height:      PainSc:   0-No pain     Intake/Output Summary (Last 24 hours) at 09/01/2019 1215 Last data filed at 08/31/2019 2035 Gross per 24 hour  Intake 495.24 ml  Output --  Net 495.24 ml   Filed Weights   08/30/19 0746 08/30/19 1129 08/31/19 0500  Weight: 83.9 kg 84.4 kg 85.2 kg   Weight change:    Intake/Output from previous day: 03/25 0701 - 03/26 0700 In: 495.2 [I.V.:17.2; Blood:295.8; IV  Piggyback:182.2] Out: -  Intake/Output this shift: No intake/output data recorded.  Examination:  General exam: AA oriented at baseline, weak and obese HEENT:Oral mucosa moist, Ear/Nose WNL grossly,dentition normal. Respiratory system: bilaterally clear, non tender. Cardiovascular system: S1 & S2 +, regular, No JVD. Gastrointestinal system: Abdomen soft, NT,ND, BS+. Nervous System:Alert, awake, moving extremities and grossly nonfocal Extremities: No edema, distal peripheral pulses palpable.  Skin: No rashes,no icterus. MSK: Normal muscle bulk,tone, power  Data Reviewed: I have personally reviewed following labs and imaging studies CBC: Recent Labs  Lab 08/08/2019 1247 08/18/2019 1321 08/15/2019 0304 08/24/2019 0304 08/30/19 0418 08/30/19 1849 08/31/19 0141 08/31/19 0820 08/31/19 1513 09/01/19 0158 09/01/19 0804  WBC 5.2   < > 5.6  --  4.2  --  4.8  --   --  4.5 5.5  NEUTROABS 3.4  --   --   --   --   --   --   --   --   --   --   HGB 4.9*   < > 7.8*   < >  6.1*   < > 7.4* 7.9* 7.1* 8.2* 8.7*  HCT 16.0*   < > 24.4*   < > 18.9*   < > 23.1* 23.7* 22.1* 25.8* 26.8*  MCV 127.0*   < > 102.1*  --  102.7*  --  97.9  --   --  98.1 97.8  PLT 151   < > 153  --  141*  --  123*  --   --  141*  135* 163   < > = values in this interval not displayed.   Basic Metabolic Panel: Recent Labs  Lab 09/02/2019 1247 08/13/2019 1321 08/10/2019 0739 09/03/2019 1608 08/30/19 0418 08/31/19 0141 09/01/19 0158  NA 140   < > 139 136 138 136 136  K 3.6   < > 4.0 4.1 3.7 3.9 4.5  CL 103   < > 104 100 103 100 101  CO2 24  --   --  _0 GLUCOSE 107*   < > 88 86 105* 114* 85  BUN 20   < > 28* 12 14 7* 12  CREATININE 5.43*   < > 6.30* 3.33* 4.53* 2.88* 3.77*  CALCIUM 7.0*  --   --  6.7* 7.0* 7.1* 7.0*  MG  --   --   --   --  1.6*  --   --   PHOS  --   --   --  1.8* 1.9*  --   --    < > = values in this interval not displayed.   GFR: Estimated Creatinine Clearance: 13 mL/min (A) (by C-G formula  based on SCr of 3.77 mg/dL (H)). Liver Function Tests: Recent Labs  Lab 08/21/2019 1247 08/28/2019 1608 08/30/19 0418  AST 36  --   --   ALT 17  --   --   ALKPHOS 50  --   --   BILITOT 1.4*  --   --   PROT 4.3*  --   --   ALBUMIN 1.5* 1.6* 1.6*   No results for input(s): LIPASE, AMYLASE in the last 168 hours. No results for input(s): AMMONIA in the last 168 hours. Coagulation Profile: Recent Labs  Lab 08/18/2019 1247 08/26/2019 1206 08/30/19 0418 09/01/19 0158  INR 2.1* 1.5* 1.5* 1.4*   Cardiac Enzymes: No results for input(s): CKTOTAL, CKMB, CKMBINDEX, TROPONINI in the last 168 hours. BNP (last 3 results) No results for input(s): PROBNP in the last 8760 hours. HbA1C: No results for input(s): HGBA1C in the last 72 hours. CBG: Recent Labs  Lab 08/31/19 1215 08/31/19 1716 08/31/19 2037 09/01/19 0439 09/01/19 0801  GLUCAP 99 103* 88 92 87   Lipid Profile: No results for input(s): CHOL, HDL, LDLCALC, TRIG, CHOLHDL, LDLDIRECT in the last 72 hours. Thyroid Function Tests: No results for input(s): TSH, T4TOTAL, FREET4, T3FREE, THYROIDAB in the last 72 hours. Anemia Panel: No results for input(s): VITAMINB12, FOLATE, FERRITIN, TIBC, IRON, RETICCTPCT in the last 72 hours. Sepsis Labs: Recent Labs  Lab 08/25/2019 1247  LATICACIDVEN 1.8    Recent Results (from the past 240 hour(s))  Respiratory Panel by RT PCR (Flu A&B, Covid) - Nasopharyngeal Swab     Status: None   Collection Time: 08/12/2019  3:17 PM   Specimen: Nasopharyngeal Swab  Result Value Ref Range Status   SARS Coronavirus 2 by RT PCR NEGATIVE NEGATIVE Final    Comment: (NOTE) SARS-CoV-2 target nucleic acids are NOT DETECTED. The SARS-CoV-2 RNA is generally detectable in upper respiratoy specimens during  the acute phase of infection. The lowest concentration of SARS-CoV-2 viral copies this assay can detect is 131 copies/mL. A negative result does not preclude SARS-Cov-2 infection and should not be used as the  sole basis for treatment or other patient management decisions. A negative result may occur with  improper specimen collection/handling, submission of specimen other than nasopharyngeal swab, presence of viral mutation(s) within the areas targeted by this assay, and inadequate number of viral copies (<131 copies/mL). A negative result must be combined with clinical observations, patient history, and epidemiological information. The expected result is Negative. Fact Sheet for Patients:  PinkCheek.be Fact Sheet for Healthcare Providers:  GravelBags.it This test is not yet ap proved or cleared by the [place] FDA and  has been authorized for detection and/or diagnosis of SARS-CoV-2 by FDA under an Emergency Use Authorization (EUA). This EUA will remain  in effect (meaning this test can be used) for the duration of the COVID-19 declaration under Section 564(b)(1) of the Act, 21 U.S.C. section 360bbb-3(b)(1), unless the authorization is terminated or revoked sooner.    Influenza A by PCR NEGATIVE NEGATIVE Final   Influenza B by PCR NEGATIVE NEGATIVE Final    Comment: (NOTE) The Xpert Xpress SARS-CoV-2/FLU/RSV assay is intended as an aid in  the diagnosis of influenza from Nasopharyngeal swab specimens and  should not be used as a sole basis for treatment. Nasal washings and  aspirates are unacceptable for Xpert Xpress SARS-CoV-2/FLU/RSV  testing. Fact Sheet for Patients: PinkCheek.be Fact Sheet for Healthcare Providers: GravelBags.it This test is not yet approved or cleared by the [place] FDA and  has been authorized for detection and/or diagnosis of SARS-CoV-2 by  FDA under an Emergency Use Authorization (EUA). This EUA will remain  in effect (meaning this test can be used) for the duration of the  Covid-19 declaration under Section 564(b)(1) of the Act, 21   U.S.C. section 360bbb-3(b)(1), unless the authorization is  terminated or revoked. Performed at [facility], [place] [address], [place], [place] [address]   MRSA PCR Screening     Status: None   Collection Time: 08/17/2019  9:21 PM   Specimen: Nasopharyngeal  Result Value Ref Range Status   MRSA by PCR NEGATIVE NEGATIVE Final    Comment:        The GeneXpert MRSA Assay (FDA approved for NASAL specimens only), is one component of a comprehensive MRSA colonization surveillance program. It is not intended to diagnose MRSA infection nor to guide or monitor treatment for MRSA infections. Performed at [facility], [place] [address], [place], [place] [address]       Radiology Studies: CT ANGIO PELVIS W OR WO CONTRAST  Result Date: 08/31/2019 CLINICAL DATA:  GI bleeding, recurrent dark burgundy hematochezia this morning, post EGD and colonoscopy, diverticular bleed EXAM: CT ANGIOGRAPHY CHEST, ABDOMEN AND PELVIS TECHNIQUE: Multidetector CT imaging through the chest, abdomen and pelvis was performed using the standard protocol during bolus administration of intravenous contrast. Multiplanar reconstructed images and MIPs were obtained and reviewed to evaluate the vascular anatomy. CONTRAST:  111m OMNIPAQUE IOHEXOL 350 MG/ML SOLN IV COMPARISON:  CT abdomen and pelvis 04/05/2013 FINDINGS: CTA CHEST FINDINGS Cardiovascular: Atherosclerotic calcifications aorta, proximal great vessels, and coronary arteries. Aorta normal caliber. No aortic aneurysm or dissection. Pulmonary arteries patent on non targeted exam. No pericardial effusion. Mediastinum/Nodes: Esophagus unremarkable.  No thoracic adenopathy. Lungs/Pleura: Lung apices not imaged by this protocol. Small RIGHT pleural effusion and compressive atelectasis of the RIGHT  lower lobe. Question minimal patchy infiltrate RIGHT lung. No pneumothorax or definite mass/nodule. Musculoskeletal: RIGHT shoulder prosthesis. Osseous demineralization. No  acute osseous findings. Review of the MIP images confirms the above findings. CTA ABDOMEN AND PELVIS FINDINGS VASCULAR Aorta: Normal caliber without aneurysm or dissection. Scattered atherosclerotic calcifications of abdominal aorta. Celiac: Mild plaque at origin of celiac artery. Minimal narrowing, less than 50%. No definite stenosis or thrombus. SMA: Plaque at SMA origin with slightly greater than 50% stenosis. Additional calcified plaque along the course of the SMA. Renals: Marked plaque at the origins of the renal arteries bilaterally. Renal arteries are diminutive, patient with end-stage renal disease. IMA: Patent though originates at significant plaque with high-grade stenosis of origin Inflow: Calcified plaque and minimal thrombus in the common iliac arteries bilaterally. External iliac arteries patent. Minimal plaque in the internal iliac arteries. Normal caliber iliac vessels. Veins: IVC, iliac veins, common femoral veins, portal vein, SMV and splenic vein appear patent. Review of the MIP images confirms the above findings. NON-VASCULAR Hepatobiliary: Post cholecystectomy.  Liver unremarkable. Pancreas: Atrophic pancreas without mass Spleen: Normal appearance Adrenals/Urinary Tract: Adrenal glands normal appearance. Markedly atrophic kidneys consistent with end-stage renal disease. Stomach/Bowel: Linear high attenuation focus within the stomach 10 mm diameter on precontrast image 21, question biopsy clip versus ingested foreign body, persists throughout exam. Question polyps versus redundant mucosa along the lesser curvature, recommend correlation with prior EGD. Stomach otherwise unremarkable. Duodenal bulb and sweep normal appearance. Dilated proximal small bowel loops with segment of small bowel wall thickening in the RIGHT mid abdomen question enteritis. Distal small loops beyond this site are decompressed. Cecum and ascending colon normal appearance. Appendix not visualized. Bowel wall thickening of  the hepatic flexure of the colon. High attenuation is seen within the lumen of the hepatic flexure on arterial phase (series 13, image 73) not seen on precontrast imaging and increasing on delayed images (series 19, image 45) consistent with a focus of active GI bleeding. Remainder of colon unremarkable. No additional colonic wall thickening or additional sites of active bleeding. Lymphatic: No adenopathy Reproductive: Atrophic uterus with unremarkable adnexa Other: No free air. Small amount of perihepatic free fluid. Small periumbilical hernia containing fat. Musculoskeletal: Osseous demineralization. Review of the MIP images confirms the above findings. IMPRESSION: Extensive atherosclerotic disease changes without evidence of aortic aneurysm or dissection. Plaque at the origins of the celiac and superior mesenteric arteries with less than 50% narrowing at celiac origin and greater than 50% narrowing at SMA origin. Focus of active GI bleeding at hepatic flexure of colon, which demonstrates wall thickening which could be the result of infection, ischemia, or inflammatory bowel disease. Additional thickened small bowel loop in the LEFT mid abdomen question enteritis, associated with dilatation of small bowel loops proximal to this site suggesting a component of obstruction. Question gastric polyps versus redundant mucosa; recommend correlation with prior endoscopy. Small RIGHT pleural effusion and RIGHT basilar atelectasis. Small amount of nonspecific perihepatic free fluid. Findings called to Dr. Lennox Grumbles on 08/31/2019 at 2048 hrs. Electronically Signed   By: Lavonia Dana M.D.   On: 08/31/2019 20:50   CT ANGIO ABDOMEN W &/OR WO CONTRAST  Result Date: 08/31/2019 CLINICAL DATA:  GI bleeding, recurrent dark burgundy hematochezia this morning, post EGD and colonoscopy, diverticular bleed EXAM: CT ANGIOGRAPHY CHEST, ABDOMEN AND PELVIS TECHNIQUE: Multidetector CT imaging through the chest, abdomen and pelvis was performed  using the standard protocol during bolus administration of intravenous contrast. Multiplanar reconstructed images and MIPs were obtained and reviewed  to evaluate the vascular anatomy. CONTRAST:  1107m OMNIPAQUE IOHEXOL 350 MG/ML SOLN IV COMPARISON:  CT abdomen and pelvis 04/05/2013 FINDINGS: CTA CHEST FINDINGS Cardiovascular: Atherosclerotic calcifications aorta, proximal great vessels, and coronary arteries. Aorta normal caliber. No aortic aneurysm or dissection. Pulmonary arteries patent on non targeted exam. No pericardial effusion. Mediastinum/Nodes: Esophagus unremarkable.  No thoracic adenopathy. Lungs/Pleura: Lung apices not imaged by this protocol. Small RIGHT pleural effusion and compressive atelectasis of the RIGHT lower lobe. Question minimal patchy infiltrate RIGHT lung. No pneumothorax or definite mass/nodule. Musculoskeletal: RIGHT shoulder prosthesis. Osseous demineralization. No acute osseous findings. Review of the MIP images confirms the above findings. CTA ABDOMEN AND PELVIS FINDINGS VASCULAR Aorta: Normal caliber without aneurysm or dissection. Scattered atherosclerotic calcifications of abdominal aorta. Celiac: Mild plaque at origin of celiac artery. Minimal narrowing, less than 50%. No definite stenosis or thrombus. SMA: Plaque at SMA origin with slightly greater than 50% stenosis. Additional calcified plaque along the course of the SMA. Renals: Marked plaque at the origins of the renal arteries bilaterally. Renal arteries are diminutive, patient with end-stage renal disease. IMA: Patent though originates at significant plaque with high-grade stenosis of origin Inflow: Calcified plaque and minimal thrombus in the common iliac arteries bilaterally. External iliac arteries patent. Minimal plaque in the internal iliac arteries. Normal caliber iliac vessels. Veins: IVC, iliac veins, common femoral veins, portal vein, SMV and splenic vein appear patent. Review of the MIP images confirms the above  findings. NON-VASCULAR Hepatobiliary: Post cholecystectomy.  Liver unremarkable. Pancreas: Atrophic pancreas without mass Spleen: Normal appearance Adrenals/Urinary Tract: Adrenal glands normal appearance. Markedly atrophic kidneys consistent with end-stage renal disease. Stomach/Bowel: Linear high attenuation focus within the stomach 10 mm diameter on precontrast image 21, question biopsy clip versus ingested foreign body, persists throughout exam. Question polyps versus redundant mucosa along the lesser curvature, recommend correlation with prior EGD. Stomach otherwise unremarkable. Duodenal bulb and sweep normal appearance. Dilated proximal small bowel loops with segment of small bowel wall thickening in the RIGHT mid abdomen question enteritis. Distal small loops beyond this site are decompressed. Cecum and ascending colon normal appearance. Appendix not visualized. Bowel wall thickening of the hepatic flexure of the colon. High attenuation is seen within the lumen of the hepatic flexure on arterial phase (series 13, image 73) not seen on precontrast imaging and increasing on delayed images (series 19, image 45) consistent with a focus of active GI bleeding. Remainder of colon unremarkable. No additional colonic wall thickening or additional sites of active bleeding. Lymphatic: No adenopathy Reproductive: Atrophic uterus with unremarkable adnexa Other: No free air. Small amount of perihepatic free fluid. Small periumbilical hernia containing fat. Musculoskeletal: Osseous demineralization. Review of the MIP images confirms the above findings. IMPRESSION: Extensive atherosclerotic disease changes without evidence of aortic aneurysm or dissection. Plaque at the origins of the celiac and superior mesenteric arteries with less than 50% narrowing at celiac origin and greater than 50% narrowing at SMA origin. Focus of active GI bleeding at hepatic flexure of colon, which demonstrates wall thickening which could be the  result of infection, ischemia, or inflammatory bowel disease. Additional thickened small bowel loop in the LEFT mid abdomen question enteritis, associated with dilatation of small bowel loops proximal to this site suggesting a component of obstruction. Question gastric polyps versus redundant mucosa; recommend correlation with prior endoscopy. Small RIGHT pleural effusion and RIGHT basilar atelectasis. Small amount of nonspecific perihepatic free fluid. Findings called to Dr. KLennox Grumbleson 08/31/2019 at 2048 hrs. Electronically Signed   By:  Lavonia Dana M.D.   On: 08/31/2019 20:50     LOS: 5 days   Time spent: More than 50% of that time was spent in counseling and/or coordination of care.  Antonieta Pert, MD [facility]  09/01/2019, 12:15 PM

## 2019-09-01 NOTE — Progress Notes (Addendum)
Progress Note     ASSESSMENT AND PLAN:   #Recurrent GI Bleed, most likely diverticular hemorrhage. --CTA yesterday showed active bleeding at hepatic flexure. IR consulted, she is s/p arteriogram and embolization late last night   # Acute on chronic anemia secondary to blood loss --s/p 3rd unit of blood last night for hgb of 7.1.  Hgb improved today to 8.7. Marland Kitchen   # Coagulopathy, ? Secondary to malnutrition --Improved with Vitamin K ( 2.1 >>> 1.4)  # ESRD on HD    SUBJECTIVE    No complaints at present.    OBJECTIVE:     Vital signs in last 24 hours: Temp:  [98 F (36.7 C)-98.7 F (37.1 C)] 98.6 F (37 C) (03/26 0934) Pulse Rate:  [88-106] 97 (03/26 0934) Resp:  [16-21] 17 (03/26 0934) BP: (80-123)/(35-86) 95/74 (03/26 0934) SpO2:  [97 %-100 %] 98 % (03/26 0934) Last BM Date: 08/31/19 General:   Alert, NAD EENT:  Normal hearing, non icteric sclera   Heart:  Regular rate and rhythm;  No lower extremity edema   Pulm: Normal respiratory effort   Abdomen:  Soft, nondistended, nontender.  Normal bowel sounds.          Neurologic:  Alert and  oriented x4;  grossly normal neurologically. Psych:  Pleasant, cooperative.  Normal mood and affect.   Intake/Output from previous day: 03/25 0701 - 03/26 0700 In: 495.2 [I.V.:17.2; Blood:295.8; IV Piggyback:182.2] Out: -  Intake/Output this shift: No intake/output data recorded.  Lab Results: Recent Labs    08/31/19 0141 08/31/19 0820 08/31/19 1513 09/01/19 0158 09/01/19 0804  WBC 4.8  --   --  4.5 5.5  HGB 7.4*   < > 7.1* 8.2* 8.7*  HCT 23.1*   < > 22.1* 25.8* 26.8*  PLT 123*  --   --  141*  135* 163   < > = values in this interval not displayed.   BMET Recent Labs    08/30/19 0418 08/31/19 0141 09/01/19 0158  NA 138 136 136  K 3.7 3.9 4.5  CL 103 100 101  CO2 23 24 23   GLUCOSE 105* 114* 85  BUN 14 7* 12  CREATININE 4.53* 2.88* 3.77*  CALCIUM 7.0* 7.1* 7.0*   LFT Recent Labs    08/30/19 0418   ALBUMIN 1.6*   PT/INR Recent Labs    08/30/19 0418 09/01/19 0158  LABPROT 17.9* 17.1*  INR 1.5* 1.4*   Hepatitis Panel No results for input(s): HEPBSAG, HCVAB, HEPAIGM, HEPBIGM in the last 72 hours.  CT ANGIO PELVIS W OR WO CONTRAST  Result Date: 08/31/2019 CLINICAL DATA:  GI bleeding, recurrent dark burgundy hematochezia this morning, post EGD and colonoscopy, diverticular bleed EXAM: CT ANGIOGRAPHY CHEST, ABDOMEN AND PELVIS TECHNIQUE: Multidetector CT imaging through the chest, abdomen and pelvis was performed using the standard protocol during bolus administration of intravenous contrast. Multiplanar reconstructed images and MIPs were obtained and reviewed to evaluate the vascular anatomy. CONTRAST:  139mL OMNIPAQUE IOHEXOL 350 MG/ML SOLN IV COMPARISON:  CT abdomen and pelvis 04/05/2013 FINDINGS: CTA CHEST FINDINGS Cardiovascular: Atherosclerotic calcifications aorta, proximal great vessels, and coronary arteries. Aorta normal caliber. No aortic aneurysm or dissection. Pulmonary arteries patent on non targeted exam. No pericardial effusion. Mediastinum/Nodes: Esophagus unremarkable.  No thoracic adenopathy. Lungs/Pleura: Lung apices not imaged by this protocol. Small RIGHT pleural effusion and compressive atelectasis of the RIGHT lower lobe. Question minimal patchy infiltrate RIGHT lung. No pneumothorax or definite mass/nodule. Musculoskeletal: RIGHT shoulder prosthesis. Osseous  demineralization. No acute osseous findings. Review of the MIP images confirms the above findings. CTA ABDOMEN AND PELVIS FINDINGS VASCULAR Aorta: Normal caliber without aneurysm or dissection. Scattered atherosclerotic calcifications of abdominal aorta. Celiac: Mild plaque at origin of celiac artery. Minimal narrowing, less than 50%. No definite stenosis or thrombus. SMA: Plaque at SMA origin with slightly greater than 50% stenosis. Additional calcified plaque along the course of the SMA. Renals: Marked plaque at the  origins of the renal arteries bilaterally. Renal arteries are diminutive, patient with end-stage renal disease. IMA: Patent though originates at significant plaque with high-grade stenosis of origin Inflow: Calcified plaque and minimal thrombus in the common iliac arteries bilaterally. External iliac arteries patent. Minimal plaque in the internal iliac arteries. Normal caliber iliac vessels. Veins: IVC, iliac veins, common femoral veins, portal vein, SMV and splenic vein appear patent. Review of the MIP images confirms the above findings. NON-VASCULAR Hepatobiliary: Post cholecystectomy.  Liver unremarkable. Pancreas: Atrophic pancreas without mass Spleen: Normal appearance Adrenals/Urinary Tract: Adrenal glands normal appearance. Markedly atrophic kidneys consistent with end-stage renal disease. Stomach/Bowel: Linear high attenuation focus within the stomach 10 mm diameter on precontrast image 21, question biopsy clip versus ingested foreign body, persists throughout exam. Question polyps versus redundant mucosa along the lesser curvature, recommend correlation with prior EGD. Stomach otherwise unremarkable. Duodenal bulb and sweep normal appearance. Dilated proximal small bowel loops with segment of small bowel wall thickening in the RIGHT mid abdomen question enteritis. Distal small loops beyond this site are decompressed. Cecum and ascending colon normal appearance. Appendix not visualized. Bowel wall thickening of the hepatic flexure of the colon. High attenuation is seen within the lumen of the hepatic flexure on arterial phase (series 13, image 73) not seen on precontrast imaging and increasing on delayed images (series 19, image 45) consistent with a focus of active GI bleeding. Remainder of colon unremarkable. No additional colonic wall thickening or additional sites of active bleeding. Lymphatic: No adenopathy Reproductive: Atrophic uterus with unremarkable adnexa Other: No free air. Small amount of  perihepatic free fluid. Small periumbilical hernia containing fat. Musculoskeletal: Osseous demineralization. Review of the MIP images confirms the above findings. IMPRESSION: Extensive atherosclerotic disease changes without evidence of aortic aneurysm or dissection. Plaque at the origins of the celiac and superior mesenteric arteries with less than 50% narrowing at celiac origin and greater than 50% narrowing at SMA origin. Focus of active GI bleeding at hepatic flexure of colon, which demonstrates wall thickening which could be the result of infection, ischemia, or inflammatory bowel disease. Additional thickened small bowel loop in the LEFT mid abdomen question enteritis, associated with dilatation of small bowel loops proximal to this site suggesting a component of obstruction. Question gastric polyps versus redundant mucosa; recommend correlation with prior endoscopy. Small RIGHT pleural effusion and RIGHT basilar atelectasis. Small amount of nonspecific perihepatic free fluid. Findings called to Dr. Lennox Grumbles on 08/31/2019 at 2048 hrs. Electronically Signed   By: Lavonia Dana M.D.   On: 08/31/2019 20:50   CT ANGIO ABDOMEN W &/OR WO CONTRAST  Result Date: 08/31/2019 CLINICAL DATA:  GI bleeding, recurrent dark burgundy hematochezia this morning, post EGD and colonoscopy, diverticular bleed EXAM: CT ANGIOGRAPHY CHEST, ABDOMEN AND PELVIS TECHNIQUE: Multidetector CT imaging through the chest, abdomen and pelvis was performed using the standard protocol during bolus administration of intravenous contrast. Multiplanar reconstructed images and MIPs were obtained and reviewed to evaluate the vascular anatomy. CONTRAST:  167mL OMNIPAQUE IOHEXOL 350 MG/ML SOLN IV COMPARISON:  CT abdomen and  pelvis 04/05/2013 FINDINGS: CTA CHEST FINDINGS Cardiovascular: Atherosclerotic calcifications aorta, proximal great vessels, and coronary arteries. Aorta normal caliber. No aortic aneurysm or dissection. Pulmonary arteries patent on  non targeted exam. No pericardial effusion. Mediastinum/Nodes: Esophagus unremarkable.  No thoracic adenopathy. Lungs/Pleura: Lung apices not imaged by this protocol. Small RIGHT pleural effusion and compressive atelectasis of the RIGHT lower lobe. Question minimal patchy infiltrate RIGHT lung. No pneumothorax or definite mass/nodule. Musculoskeletal: RIGHT shoulder prosthesis. Osseous demineralization. No acute osseous findings. Review of the MIP images confirms the above findings. CTA ABDOMEN AND PELVIS FINDINGS VASCULAR Aorta: Normal caliber without aneurysm or dissection. Scattered atherosclerotic calcifications of abdominal aorta. Celiac: Mild plaque at origin of celiac artery. Minimal narrowing, less than 50%. No definite stenosis or thrombus. SMA: Plaque at SMA origin with slightly greater than 50% stenosis. Additional calcified plaque along the course of the SMA. Renals: Marked plaque at the origins of the renal arteries bilaterally. Renal arteries are diminutive, patient with end-stage renal disease. IMA: Patent though originates at significant plaque with high-grade stenosis of origin Inflow: Calcified plaque and minimal thrombus in the common iliac arteries bilaterally. External iliac arteries patent. Minimal plaque in the internal iliac arteries. Normal caliber iliac vessels. Veins: IVC, iliac veins, common femoral veins, portal vein, SMV and splenic vein appear patent. Review of the MIP images confirms the above findings. NON-VASCULAR Hepatobiliary: Post cholecystectomy.  Liver unremarkable. Pancreas: Atrophic pancreas without mass Spleen: Normal appearance Adrenals/Urinary Tract: Adrenal glands normal appearance. Markedly atrophic kidneys consistent with end-stage renal disease. Stomach/Bowel: Linear high attenuation focus within the stomach 10 mm diameter on precontrast image 21, question biopsy clip versus ingested foreign body, persists throughout exam. Question polyps versus redundant mucosa along  the lesser curvature, recommend correlation with prior EGD. Stomach otherwise unremarkable. Duodenal bulb and sweep normal appearance. Dilated proximal small bowel loops with segment of small bowel wall thickening in the RIGHT mid abdomen question enteritis. Distal small loops beyond this site are decompressed. Cecum and ascending colon normal appearance. Appendix not visualized. Bowel wall thickening of the hepatic flexure of the colon. High attenuation is seen within the lumen of the hepatic flexure on arterial phase (series 13, image 73) not seen on precontrast imaging and increasing on delayed images (series 19, image 45) consistent with a focus of active GI bleeding. Remainder of colon unremarkable. No additional colonic wall thickening or additional sites of active bleeding. Lymphatic: No adenopathy Reproductive: Atrophic uterus with unremarkable adnexa Other: No free air. Small amount of perihepatic free fluid. Small periumbilical hernia containing fat. Musculoskeletal: Osseous demineralization. Review of the MIP images confirms the above findings. IMPRESSION: Extensive atherosclerotic disease changes without evidence of aortic aneurysm or dissection. Plaque at the origins of the celiac and superior mesenteric arteries with less than 50% narrowing at celiac origin and greater than 50% narrowing at SMA origin. Focus of active GI bleeding at hepatic flexure of colon, which demonstrates wall thickening which could be the result of infection, ischemia, or inflammatory bowel disease. Additional thickened small bowel loop in the LEFT mid abdomen question enteritis, associated with dilatation of small bowel loops proximal to this site suggesting a component of obstruction. Question gastric polyps versus redundant mucosa; recommend correlation with prior endoscopy. Small RIGHT pleural effusion and RIGHT basilar atelectasis. Small amount of nonspecific perihepatic free fluid. Findings called to Dr. Lennox Grumbles on 08/31/2019  at 2048 hrs. Electronically Signed   By: Lavonia Dana M.D.   On: 08/31/2019 20:50      Active Problems:  GIB (gastrointestinal bleeding)   Benign neoplasm of ascending colon   Diverticulosis of colon with hemorrhage   Rectal ulcer     LOS: 5 days   Tye Savoy ,NP 09/01/2019, 10:50 AM  GI ATTENDING  Interval history data reviewed.  Patient seen and examined.  Agree with interval progress note as outlined above.  Stable after embolization therapy.  Continue to monitor closely and support with blood if needed.  Will follow.  Docia Chuck. Geri Seminole., M.D. [facility] of Gastroenterology

## 2019-09-02 ENCOUNTER — Inpatient Hospital Stay (HOSPITAL_COMMUNITY): Payer: Medicare Other

## 2019-09-02 DIAGNOSIS — K5791 Diverticulosis of intestine, part unspecified, without perforation or abscess with bleeding: Secondary | ICD-10-CM

## 2019-09-02 LAB — CBC
HCT: 24.5 % — ABNORMAL LOW (ref 36.0–46.0)
HCT: 26.6 % — ABNORMAL LOW (ref 36.0–46.0)
Hemoglobin: 7.8 g/dL — ABNORMAL LOW (ref 12.0–15.0)
Hemoglobin: 8.6 g/dL — ABNORMAL LOW (ref 12.0–15.0)
MCH: 30.1 pg (ref 26.0–34.0)
MCH: 30.5 pg (ref 26.0–34.0)
MCHC: 31.8 g/dL (ref 30.0–36.0)
MCHC: 32.3 g/dL (ref 30.0–36.0)
MCV: 94.6 fL (ref 80.0–100.0)
MCV: 94.3 fL (ref 80.0–100.0)
Platelets: 124 10*3/uL — ABNORMAL LOW (ref 150–400)
Platelets: 139 10*3/uL — ABNORMAL LOW (ref 150–400)
RBC: 2.59 MIL/uL — ABNORMAL LOW (ref 3.87–5.11)
RBC: 2.82 MIL/uL — ABNORMAL LOW (ref 3.87–5.11)
RDW: 21.5 % — ABNORMAL HIGH (ref 11.5–15.5)
RDW: 22.3 % — ABNORMAL HIGH (ref 11.5–15.5)
WBC: 6.3 10*3/uL (ref 4.0–10.5)
WBC: 7 10*3/uL (ref 4.0–10.5)
nRBC: 0.6 % — ABNORMAL HIGH (ref 0.0–0.2)
nRBC: 0.6 % — ABNORMAL HIGH (ref 0.0–0.2)

## 2019-09-02 LAB — BASIC METABOLIC PANEL
Anion gap: 11 (ref 5–15)
BUN: <5 mg/dL — ABNORMAL LOW (ref 8–23)
CO2: 24 mmol/L (ref 22–32)
Calcium: 7.2 mg/dL — ABNORMAL LOW (ref 8.9–10.3)
Chloride: 103 mmol/L (ref 98–111)
Creatinine, Ser: 2.7 mg/dL — ABNORMAL HIGH (ref 0.44–1.00)
GFR calc Af Amer: 20 mL/min — ABNORMAL LOW (ref >60)
GFR calc non Af Amer: 17 mL/min — ABNORMAL LOW (ref >60)
Glucose, Bld: 88 mg/dL (ref 70–99)
Potassium: 3.5 mmol/L (ref 3.5–5.1)
Sodium: 138 mmol/L (ref 135–145)

## 2019-09-02 LAB — PREPARE RBC (CROSSMATCH)

## 2019-09-02 LAB — GLUCOSE, CAPILLARY
Glucose-Capillary: 75 mg/dL (ref 70–99)
Glucose-Capillary: 85 mg/dL (ref 70–99)
Glucose-Capillary: 82 mg/dL (ref 70–99)

## 2019-09-02 LAB — HEMOGLOBIN AND HEMATOCRIT, BLOOD
HCT: 22.7 % — ABNORMAL LOW (ref 36.0–46.0)
Hemoglobin: 7.3 g/dL — ABNORMAL LOW (ref 12.0–15.0)

## 2019-09-02 MED ORDER — SODIUM CHLORIDE 0.9% IV SOLUTION: Freq: Once | INTRAVENOUS | Status: DC

## 2019-09-02 NOTE — Progress Notes (Signed)
Patient ID: Roberta Bryant, female   DOB: 07/08/1950, 69 y.o.   MRN: [id number] Hb has gone 7.8->8.6->7.3 Now some further BPR Agree with transfusion Will check tagged RBC scan Make NPO except meds  Georganna Skeans, MD, MPH, FACS Please use [email or web address] to contact on call provider

## 2019-09-02 NOTE — Progress Notes (Signed)
   09/02/19 1506  Vitals  Temp 97.8 F (36.6 C)  Temp Source Oral  BP (!) 88/54  MAP (mmHg) (!) 63  BP Location Right Arm  BP Method Automatic  Patient Position (if appropriate) Lying  Pulse Rate 93  Pulse Rate Source Monitor  Oxygen Therapy  SpO2 100 %  O2 Device Room Air  MEWS Score  MEWS Temp 0  MEWS Systolic 1  MEWS Pulse 0  MEWS RR 0  MEWS LOC 0  MEWS Score 1  MEWS Score Color Green   Dr Maren Beach texted paged bp 88/54, pt now alert and oriented x1, HGB 7.3... Marland Kitchen

## 2019-09-02 NOTE — Progress Notes (Signed)
[facility] Progress Note   Dialysis Orders: [facility] MWF 3.5 hr EDW 85 400/A 1.5 3 K 2.25 Ca 36 degrees profile 4 var Na 148 linear left upper AVF heparin 4000 with 1500 mid tmt Mircera 100 q 2 wks -last had 60 on 3/10 calcitriol 1 Binders: fosrenol 2 gm ac  Assessment/Plan: 1. ABLA - secondary to diverticular bleed - s/p colonoscopy with mult tics, 6 polyps removed, linear non bleeding rectal ulcer - s/p transfusion;. Recurred and now s/p IR embolization.  Still had blood stools early today 2. ESRD- MWF - next HD Monday - may need to use alb - no heparin for d/c 3. AVF issues - recent bleeding issues with access - no heparin HD. Closer exam of access and access arm shows some generalized edema more so than the right.- last f'gram 05/2018. AF have been variable without intervention over the last year. Dr. Posey Pronto rec f'gram if not contraindicated due to recent GIB- favor deferring until GI issue resolved. 4. Hypotension/volume- BP low/stable. UF as tolerated.- kept even Friday, 350 ml off Wed and 900 ml off last Monday Wt 83.8  5. Anemia- s/p 3U PRBCs this admit - last 3/25. Continue Aranesp 168mcg q Wed.hgb down to 6.6 last night and 7 today after another unit 3/26 - 4 total; appears she will likely need more transfusion 6. Metabolic bone disease-Phos low without binders - on Calcitriol for Ca support I think since hx parathyroidectomy; corrected Ca ok since alb is so low 7. Severe malnutrition- alb severely low - continue pro-stat + nepro. 8. Anxeity/depression 9. CAD 10. DM/hypothyroidism- per primary 11. DNR 12. GOC -for d/c to SNF when medically stable - has two offers 13. Thrombocytopenia - [facility] Beeper [phone] 09/02/2019,8:28 AM  LOS: 6 days   Subjective:   Very groggy, sleepy. Rouses with abdominal exam. Has not eaten breakfast yet.  Objective Vitals:   09/01/19 2208 09/01/19 2211 09/02/19 0121 09/02/19  0456  BP: 115/60 115/60 (!) 115/56 115/63  Pulse: (!) 106 (!) 106 97 99  Resp: 16  18 20   Temp: 98.4 F (36.9 C)  98.9 F (37.2 C) 99.3 F (37.4 C)  TempSrc: Oral  Oral Oral  SpO2: 100%  98% 100%  Weight:      Height:       Physical Exam General: obese very sleepy elderly female - NAD at rest Heart: RRR Lungs: dim BM poor effort Abdomen: obese soft R sided tenderness Extremities: no sig LE edema Dialysis Access: left upper AVF + bruit with left arm edema  Additional Objective Labs: Basic Metabolic Panel: Recent Labs  Lab 08/17/2019 1608 08/07/2019 1608 08/30/19 0418 08/30/19 0418 08/31/19 0141 09/01/19 0158 09/02/19 0444  NA 136   < > 138   < > 136 136 138  K 4.1   < > 3.7   < > 3.9 4.5 3.5  CL 100   < > 103   < > 100 101 103  CO2 25   < > 23   < > 24 23 24   GLUCOSE 86   < > 105*   < > 114* 85 88  BUN 12   < > 14   < > 7* 12 <5*  CREATININE 3.33*   < > 4.53*   < > 2.88* 3.77* 2.70*  CALCIUM 6.7*   < > 7.0*   < > 7.1* 7.0* 7.2*  PHOS 1.8*  --  1.9*  --   --   --   --    < > =  values in this interval not displayed.   Liver Function Tests: Recent Labs  Lab 08/19/2019 1247 08/17/2019 1608 08/30/19 0418  AST 36  --   --   ALT 17  --   --   ALKPHOS 50  --   --   BILITOT 1.4*  --   --   PROT 4.3*  --   --   ALBUMIN 1.5* 1.6* 1.6*   No results for input(s): LIPASE, AMYLASE in the last 168 hours. CBC: Recent Labs  Lab 08/14/2019 1247 08/17/2019 1321 09/01/19 0158 09/01/19 0158 09/01/19 0804 09/01/19 0804 09/01/19 1500 09/01/19 2026 09/02/19 0444  WBC 5.2   < > 4.5   < > 5.5   < > 5.7 5.6 6.3  NEUTROABS 3.4  --   --   --   --   --   --   --   --   HGB 4.9*   < > 8.2*   < > 8.7*   < > 7.3* 6.6* 7.8*  HCT 16.0*   < > 25.8*   < > 26.8*   < > 22.9* 20.4* 24.5*  MCV 127.0*   < > 98.1  --  97.8  --  97.9 99.5 94.6  PLT 151   < > 141*  135*   < > 163   < > 159 114* 124*   < > = values in this interval not displayed.   Blood Culture    Component Value Date/Time    SDES URINE, CLEAN CATCH 01/04/2018 1524   SPECREQUEST  01/04/2018 1524    NONE Performed at [facility] [address] [place], [place] [address]    CULT MULTIPLE SPECIES PRESENT, SUGGEST RECOLLECTION (A) 01/04/2018 1524   REPTSTATUS 01/05/2018 FINAL 01/04/2018 1524    Cardiac Enzymes: No results for input(s): CKTOTAL, CKMB, CKMBINDEX, TROPONINI in the last 168 hours. CBG: Recent Labs  Lab 09/01/19 0801 09/01/19 1223 09/01/19 1849 09/01/19 2149 09/02/19 0444  GLUCAP 87 91 87 88 75   Iron Studies: No results for input(s): IRON, TIBC, TRANSFERRIN, FERRITIN in the last 72 hours. Lab Results  Component Value Date   INR 1.4 (H) 09/01/2019   INR 1.5 (H) 08/30/2019   INR 1.5 (H) 08/28/2019   Studies/Results: CT ANGIO PELVIS W OR WO CONTRAST  Result Date: 08/31/2019 CLINICAL DATA:  GI bleeding, recurrent dark burgundy hematochezia this morning, post EGD and colonoscopy, diverticular bleed EXAM: CT ANGIOGRAPHY CHEST, ABDOMEN AND PELVIS TECHNIQUE: Multidetector CT imaging through the chest, abdomen and pelvis was performed using the standard protocol during bolus administration of intravenous contrast. Multiplanar reconstructed images and MIPs were obtained and reviewed to evaluate the vascular anatomy. CONTRAST:  139mL OMNIPAQUE IOHEXOL 350 MG/ML SOLN IV COMPARISON:  CT abdomen and pelvis 04/05/2013 FINDINGS: CTA CHEST FINDINGS Cardiovascular: Atherosclerotic calcifications aorta, proximal great vessels, and coronary arteries. Aorta normal caliber. No aortic aneurysm or dissection. Pulmonary arteries patent on non targeted exam. No pericardial effusion. Mediastinum/Nodes: Esophagus unremarkable.  No thoracic adenopathy. Lungs/Pleura: Lung apices not imaged by this protocol. Small RIGHT pleural effusion and compressive atelectasis of the RIGHT lower lobe. Question minimal patchy infiltrate RIGHT lung. No pneumothorax or definite mass/nodule. Musculoskeletal: RIGHT shoulder prosthesis.  Osseous demineralization. No acute osseous findings. Review of the MIP images confirms the above findings. CTA ABDOMEN AND PELVIS FINDINGS VASCULAR Aorta: Normal caliber without aneurysm or dissection. Scattered atherosclerotic calcifications of abdominal aorta. Celiac: Mild plaque at origin of celiac artery. Minimal narrowing, less than 50%. No  definite stenosis or thrombus. SMA: Plaque at SMA origin with slightly greater than 50% stenosis. Additional calcified plaque along the course of the SMA. Renals: Marked plaque at the origins of the renal arteries bilaterally. Renal arteries are diminutive, patient with end-stage renal disease. IMA: Patent though originates at significant plaque with high-grade stenosis of origin Inflow: Calcified plaque and minimal thrombus in the common iliac arteries bilaterally. External iliac arteries patent. Minimal plaque in the internal iliac arteries. Normal caliber iliac vessels. Veins: IVC, iliac veins, common femoral veins, portal vein, SMV and splenic vein appear patent. Review of the MIP images confirms the above findings. NON-VASCULAR Hepatobiliary: Post cholecystectomy.  Liver unremarkable. Pancreas: Atrophic pancreas without mass Spleen: Normal appearance Adrenals/Urinary Tract: Adrenal glands normal appearance. Markedly atrophic kidneys consistent with end-stage renal disease. Stomach/Bowel: Linear high attenuation focus within the stomach 10 mm diameter on precontrast image 21, question biopsy clip versus ingested foreign body, persists throughout exam. Question polyps versus redundant mucosa along the lesser curvature, recommend correlation with prior EGD. Stomach otherwise unremarkable. Duodenal bulb and sweep normal appearance. Dilated proximal small bowel loops with segment of small bowel wall thickening in the RIGHT mid abdomen question enteritis. Distal small loops beyond this site are decompressed. Cecum and ascending colon normal appearance. Appendix not  visualized. Bowel wall thickening of the hepatic flexure of the colon. High attenuation is seen within the lumen of the hepatic flexure on arterial phase (series 13, image 73) not seen on precontrast imaging and increasing on delayed images (series 19, image 45) consistent with a focus of active GI bleeding. Remainder of colon unremarkable. No additional colonic wall thickening or additional sites of active bleeding. Lymphatic: No adenopathy Reproductive: Atrophic uterus with unremarkable adnexa Other: No free air. Small amount of perihepatic free fluid. Small periumbilical hernia containing fat. Musculoskeletal: Osseous demineralization. Review of the MIP images confirms the above findings. IMPRESSION: Extensive atherosclerotic disease changes without evidence of aortic aneurysm or dissection. Plaque at the origins of the celiac and superior mesenteric arteries with less than 50% narrowing at celiac origin and greater than 50% narrowing at SMA origin. Focus of active GI bleeding at hepatic flexure of colon, which demonstrates wall thickening which could be the result of infection, ischemia, or inflammatory bowel disease. Additional thickened small bowel loop in the LEFT mid abdomen question enteritis, associated with dilatation of small bowel loops proximal to this site suggesting a component of obstruction. Question gastric polyps versus redundant mucosa; recommend correlation with prior endoscopy. Small RIGHT pleural effusion and RIGHT basilar atelectasis. Small amount of nonspecific perihepatic free fluid. Findings called to Dr. Lennox Grumbles on 08/31/2019 at 2048 hrs. Electronically Signed   By: Lavonia Dana M.D.   On: 08/31/2019 20:50   CT ANGIO ABDOMEN W &/OR WO CONTRAST  Result Date: 08/31/2019 CLINICAL DATA:  GI bleeding, recurrent dark burgundy hematochezia this morning, post EGD and colonoscopy, diverticular bleed EXAM: CT ANGIOGRAPHY CHEST, ABDOMEN AND PELVIS TECHNIQUE: Multidetector CT imaging through the  chest, abdomen and pelvis was performed using the standard protocol during bolus administration of intravenous contrast. Multiplanar reconstructed images and MIPs were obtained and reviewed to evaluate the vascular anatomy. CONTRAST:  140mL OMNIPAQUE IOHEXOL 350 MG/ML SOLN IV COMPARISON:  CT abdomen and pelvis 04/05/2013 FINDINGS: CTA CHEST FINDINGS Cardiovascular: Atherosclerotic calcifications aorta, proximal great vessels, and coronary arteries. Aorta normal caliber. No aortic aneurysm or dissection. Pulmonary arteries patent on non targeted exam. No pericardial effusion. Mediastinum/Nodes: Esophagus unremarkable.  No thoracic adenopathy. Lungs/Pleura: Lung apices not imaged by  this protocol. Small RIGHT pleural effusion and compressive atelectasis of the RIGHT lower lobe. Question minimal patchy infiltrate RIGHT lung. No pneumothorax or definite mass/nodule. Musculoskeletal: RIGHT shoulder prosthesis. Osseous demineralization. No acute osseous findings. Review of the MIP images confirms the above findings. CTA ABDOMEN AND PELVIS FINDINGS VASCULAR Aorta: Normal caliber without aneurysm or dissection. Scattered atherosclerotic calcifications of abdominal aorta. Celiac: Mild plaque at origin of celiac artery. Minimal narrowing, less than 50%. No definite stenosis or thrombus. SMA: Plaque at SMA origin with slightly greater than 50% stenosis. Additional calcified plaque along the course of the SMA. Renals: Marked plaque at the origins of the renal arteries bilaterally. Renal arteries are diminutive, patient with end-stage renal disease. IMA: Patent though originates at significant plaque with high-grade stenosis of origin Inflow: Calcified plaque and minimal thrombus in the common iliac arteries bilaterally. External iliac arteries patent. Minimal plaque in the internal iliac arteries. Normal caliber iliac vessels. Veins: IVC, iliac veins, common femoral veins, portal vein, SMV and splenic vein appear patent. Review  of the MIP images confirms the above findings. NON-VASCULAR Hepatobiliary: Post cholecystectomy.  Liver unremarkable. Pancreas: Atrophic pancreas without mass Spleen: Normal appearance Adrenals/Urinary Tract: Adrenal glands normal appearance. Markedly atrophic kidneys consistent with end-stage renal disease. Stomach/Bowel: Linear high attenuation focus within the stomach 10 mm diameter on precontrast image 21, question biopsy clip versus ingested foreign body, persists throughout exam. Question polyps versus redundant mucosa along the lesser curvature, recommend correlation with prior EGD. Stomach otherwise unremarkable. Duodenal bulb and sweep normal appearance. Dilated proximal small bowel loops with segment of small bowel wall thickening in the RIGHT mid abdomen question enteritis. Distal small loops beyond this site are decompressed. Cecum and ascending colon normal appearance. Appendix not visualized. Bowel wall thickening of the hepatic flexure of the colon. High attenuation is seen within the lumen of the hepatic flexure on arterial phase (series 13, image 73) not seen on precontrast imaging and increasing on delayed images (series 19, image 45) consistent with a focus of active GI bleeding. Remainder of colon unremarkable. No additional colonic wall thickening or additional sites of active bleeding. Lymphatic: No adenopathy Reproductive: Atrophic uterus with unremarkable adnexa Other: No free air. Small amount of perihepatic free fluid. Small periumbilical hernia containing fat. Musculoskeletal: Osseous demineralization. Review of the MIP images confirms the above findings. IMPRESSION: Extensive atherosclerotic disease changes without evidence of aortic aneurysm or dissection. Plaque at the origins of the celiac and superior mesenteric arteries with less than 50% narrowing at celiac origin and greater than 50% narrowing at SMA origin. Focus of active GI bleeding at hepatic flexure of colon, which  demonstrates wall thickening which could be the result of infection, ischemia, or inflammatory bowel disease. Additional thickened small bowel loop in the LEFT mid abdomen question enteritis, associated with dilatation of small bowel loops proximal to this site suggesting a component of obstruction. Question gastric polyps versus redundant mucosa; recommend correlation with prior endoscopy. Small RIGHT pleural effusion and RIGHT basilar atelectasis. Small amount of nonspecific perihepatic free fluid. Findings called to Dr. Lennox Grumbles on 08/31/2019 at 2048 hrs. Electronically Signed   By: Lavonia Dana M.D.   On: 08/31/2019 20:50   IR Angiogram Visceral Selective  Result Date: 09/01/2019 INDICATION: 69 year old female with acute active lower GI bleed. CT arteriogram localizes the bleeding to the hepatic flexure of the colon. EXAM: IR ULTRASOUND GUIDANCE VASC ACCESS RIGHT; ADDITIONAL ARTERIOGRAPHY; SELECTIVE VISCERAL ARTERIOGRAPHY; IR EMBO ART VEN HEMORR LYMPH EXTRAV INC GUIDE ROADMAPPING 1. Ultrasound-guided vascular access  right common femoral artery 2. Celiac catheterization with arteriogram 3. Superior mesenteric artery catheterization with arteriogram 4. Right colic artery with arteriogram 5. Accessory middle colic artery with arteriogram 6. Distal branch of accessory middle colic artery with arteriogram 7. Additional distal branch of accessory middle colic artery with arteriogram 8. Coil embolization MEDICATIONS: None ANESTHESIA/SEDATION: None. CONTRAST:  80mL OMNIPAQUE IOHEXOL 300 MG/ML  SOLN FLUOROSCOPY TIME:  Fluoroscopy Time: 8 minutes 0 seconds (464 mGy). COMPLICATIONS: None immediate. PROCEDURE: Informed consent was obtained from the patient following explanation of the procedure, risks, benefits and alternatives. The patient understands, agrees and consents for the procedure. All questions were addressed. A time out was performed prior to the initiation of the procedure. Maximal barrier sterile technique  utilized including caps, mask, sterile gowns, sterile gloves, large sterile drape, hand hygiene, and Betadine prep. The was interrogated with ultrasound and found to be widely patent. An image was obtained and stored for the medical record. Local anesthesia was attained by infiltration with 1% lidocaine. A small dermatotomy was made. Under real-time sonographic guidance, the vessel was punctured with a 21 gauge micropuncture needle. Using standard technique, the initial micro needle was exchanged over a 0.018 micro wire for a transitional 4 [place] micro sheath. The micro sheath was then exchanged over a 0.035 wire for a 5 French vascular sheath. A C2 cobra catheter was advanced over a Bentson wire into the abdominal aorta. The celiac axis was catheterized. Arteriography was performed. Conventional celiac arterial anatomy. No evidence of replaced middle colic artery. No evidence of active hemorrhage. The catheter was next advanced into the superior mesenteric artery. A superior mesenteric arteriogram was performed. There appears to be a faint focus of contrast extravasation in the region of the hepatic flexure. This appears to represent a watershed zone between the main and an accessory middle colic artery. A renegade ST microcatheter was advanced over a Fathom 16 wire. The first artery selected was the right colic artery. Arteriography was performed. No evidence of active hemorrhage. The artery was then used to select the accessory middle colic artery. Arteriography was performed. There is a small focus of active hemorrhage in the hepatic flexure. The active hemorrhage appears to be arising from a region of numerous small thread like arteries. In an effort to perform a more selective embolization, the microcatheter was advanced into a more inferior distal branch of the accessory middle colic artery. Arteriography was performed. There is persistent mild hemorrhage. However, there is significant cross flow between the  arterial branches. Coil embolization was initiated. This distal accessory branch was successfully coil embolized. The catheter was then advanced into the more superior distal branch of the accessory middle colic artery. There is persistent contrast extravasation. Therefore, coil embolization was performed again. The coil pack was carried back into the main trunk of the accessory middle colic artery. Follow-up arteriography confirms complete stasis. While there is likely some collateral flow from the right were branch of the main middle colic artery, the decision was made not to pursue further embolization for fear of result in ischemia. This embolization should significantly decrease the pressure head to the site of bleeding facilitating healing. The catheters were removed. Hemostasis was attained with the assistance of a 6 French Angio-Seal device. IMPRESSION: 1. Small focus of active arterial extravasation at the hepatic flexure. 2. Successful coil embolization of an accessory branch of the middle colic artery. Signed, Criselda Peaches, MD, [facility] Electronically Signed   By: Myrle Sheng  Laurence Ferrari M.D.   On: 09/01/2019 12:31   IR Angiogram Selective Each Additional Vessel  Result Date: 09/01/2019 INDICATION: 69 year old female with acute active lower GI bleed. CT arteriogram localizes the bleeding to the hepatic flexure of the colon. EXAM: IR ULTRASOUND GUIDANCE VASC ACCESS RIGHT; ADDITIONAL ARTERIOGRAPHY; SELECTIVE VISCERAL ARTERIOGRAPHY; IR EMBO ART VEN HEMORR LYMPH EXTRAV INC GUIDE ROADMAPPING 1. Ultrasound-guided vascular access right common femoral artery 2. Celiac catheterization with arteriogram 3. Superior mesenteric artery catheterization with arteriogram 4. Right colic artery with arteriogram 5. Accessory middle colic artery with arteriogram 6. Distal branch of accessory middle colic artery with arteriogram 7. Additional distal branch  of accessory middle colic artery with arteriogram 8. Coil embolization MEDICATIONS: None ANESTHESIA/SEDATION: None. CONTRAST:  71mL OMNIPAQUE IOHEXOL 300 MG/ML  SOLN FLUOROSCOPY TIME:  Fluoroscopy Time: 8 minutes 0 seconds (464 mGy). COMPLICATIONS: None immediate. PROCEDURE: Informed consent was obtained from the patient following explanation of the procedure, risks, benefits and alternatives. The patient understands, agrees and consents for the procedure. All questions were addressed. A time out was performed prior to the initiation of the procedure. Maximal barrier sterile technique utilized including caps, mask, sterile gowns, sterile gloves, large sterile drape, hand hygiene, and Betadine prep. The was interrogated with ultrasound and found to be widely patent. An image was obtained and stored for the medical record. Local anesthesia was attained by infiltration with 1% lidocaine. A small dermatotomy was made. Under real-time sonographic guidance, the vessel was punctured with a 21 gauge micropuncture needle. Using standard technique, the initial micro needle was exchanged over a 0.018 micro wire for a transitional 4 [place] micro sheath. The micro sheath was then exchanged over a 0.035 wire for a 5 French vascular sheath. A C2 cobra catheter was advanced over a Bentson wire into the abdominal aorta. The celiac axis was catheterized. Arteriography was performed. Conventional celiac arterial anatomy. No evidence of replaced middle colic artery. No evidence of active hemorrhage. The catheter was next advanced into the superior mesenteric artery. A superior mesenteric arteriogram was performed. There appears to be a faint focus of contrast extravasation in the region of the hepatic flexure. This appears to represent a watershed zone between the main and an accessory middle colic artery. A renegade ST microcatheter was advanced over a Fathom 16 wire. The first artery selected was the right colic artery. Arteriography  was performed. No evidence of active hemorrhage. The artery was then used to select the accessory middle colic artery. Arteriography was performed. There is a small focus of active hemorrhage in the hepatic flexure. The active hemorrhage appears to be arising from a region of numerous small thread like arteries. In an effort to perform a more selective embolization, the microcatheter was advanced into a more inferior distal branch of the accessory middle colic artery. Arteriography was performed. There is persistent mild hemorrhage. However, there is significant cross flow between the arterial branches. Coil embolization was initiated. This distal accessory branch was successfully coil embolized. The catheter was then advanced into the more superior distal branch of the accessory middle colic artery. There is persistent contrast extravasation. Therefore, coil embolization was performed again. The coil pack was carried back into the main trunk of the accessory middle colic artery. Follow-up arteriography confirms complete stasis. While there is likely some collateral flow from the right were branch of the main middle colic artery, the decision was made not to pursue further embolization for fear of result in ischemia. This embolization should significantly decrease the pressure head to  the site of bleeding facilitating healing. The catheters were removed. Hemostasis was attained with the assistance of a 6 French Angio-Seal device. IMPRESSION: 1. Small focus of active arterial extravasation at the hepatic flexure. 2. Successful coil embolization of an accessory branch of the middle colic artery. Signed, Criselda Peaches, MD, [place] [facility] Electronically Signed   By: Jacqulynn Cadet M.D.   On: 09/01/2019 12:31   IR Angiogram Selective Each Additional Vessel  Result Date: 09/01/2019 INDICATION: 69 year old female with acute active lower GI bleed. CT  arteriogram localizes the bleeding to the hepatic flexure of the colon. EXAM: IR ULTRASOUND GUIDANCE VASC ACCESS RIGHT; ADDITIONAL ARTERIOGRAPHY; SELECTIVE VISCERAL ARTERIOGRAPHY; IR EMBO ART VEN HEMORR LYMPH EXTRAV INC GUIDE ROADMAPPING 1. Ultrasound-guided vascular access right common femoral artery 2. Celiac catheterization with arteriogram 3. Superior mesenteric artery catheterization with arteriogram 4. Right colic artery with arteriogram 5. Accessory middle colic artery with arteriogram 6. Distal branch of accessory middle colic artery with arteriogram 7. Additional distal branch of accessory middle colic artery with arteriogram 8. Coil embolization MEDICATIONS: None ANESTHESIA/SEDATION: None. CONTRAST:  67mL OMNIPAQUE IOHEXOL 300 MG/ML  SOLN FLUOROSCOPY TIME:  Fluoroscopy Time: 8 minutes 0 seconds (464 mGy). COMPLICATIONS: None immediate. PROCEDURE: Informed consent was obtained from the patient following explanation of the procedure, risks, benefits and alternatives. The patient understands, agrees and consents for the procedure. All questions were addressed. A time out was performed prior to the initiation of the procedure. Maximal barrier sterile technique utilized including caps, mask, sterile gowns, sterile gloves, large sterile drape, hand hygiene, and Betadine prep. The was interrogated with ultrasound and found to be widely patent. An image was obtained and stored for the medical record. Local anesthesia was attained by infiltration with 1% lidocaine. A small dermatotomy was made. Under real-time sonographic guidance, the vessel was punctured with a 21 gauge micropuncture needle. Using standard technique, the initial micro needle was exchanged over a 0.018 micro wire for a transitional 4 [place] micro sheath. The micro sheath was then exchanged over a 0.035 wire for a 5 French vascular sheath. A C2 cobra catheter was advanced over a Bentson wire into the abdominal aorta. The celiac axis was  catheterized. Arteriography was performed. Conventional celiac arterial anatomy. No evidence of replaced middle colic artery. No evidence of active hemorrhage. The catheter was next advanced into the superior mesenteric artery. A superior mesenteric arteriogram was performed. There appears to be a faint focus of contrast extravasation in the region of the hepatic flexure. This appears to represent a watershed zone between the main and an accessory middle colic artery. A renegade ST microcatheter was advanced over a Fathom 16 wire. The first artery selected was the right colic artery. Arteriography was performed. No evidence of active hemorrhage. The artery was then used to select the accessory middle colic artery. Arteriography was performed. There is a small focus of active hemorrhage in the hepatic flexure. The active hemorrhage appears to be arising from a region of numerous small thread like arteries. In an effort to perform a more selective embolization, the microcatheter was advanced into a more inferior distal branch of the accessory middle colic artery. Arteriography was performed. There is persistent mild hemorrhage. However, there is significant cross flow between the arterial branches. Coil embolization was initiated. This distal accessory branch was successfully coil embolized. The catheter was then advanced into the more superior distal branch of the accessory middle colic artery. There is persistent contrast extravasation. Therefore, coil  embolization was performed again. The coil pack was carried back into the main trunk of the accessory middle colic artery. Follow-up arteriography confirms complete stasis. While there is likely some collateral flow from the right were branch of the main middle colic artery, the decision was made not to pursue further embolization for fear of result in ischemia. This embolization should significantly decrease the pressure head to the site of bleeding facilitating  healing. The catheters were removed. Hemostasis was attained with the assistance of a 6 French Angio-Seal device. IMPRESSION: 1. Small focus of active arterial extravasation at the hepatic flexure. 2. Successful coil embolization of an accessory branch of the middle colic artery. Signed, Criselda Peaches, MD, [facility] Radiology Electronically Signed   By: Jacqulynn Cadet M.D.   On: 09/01/2019 12:31   IR US Guide Vasc Access Right  Result Date: 09/01/2019 INDICATION: 69 year old female with acute active lower GI bleed. CT arteriogram localizes the bleeding to the hepatic flexure of the colon. EXAM: IR ULTRASOUND GUIDANCE VASC ACCESS RIGHT; ADDITIONAL ARTERIOGRAPHY; SELECTIVE VISCERAL ARTERIOGRAPHY; IR EMBO ART VEN HEMORR LYMPH EXTRAV INC GUIDE ROADMAPPING 1. Ultrasound-guided vascular access right common femoral artery 2. Celiac catheterization with arteriogram 3. Superior mesenteric artery catheterization with arteriogram 4. Right colic artery with arteriogram 5. Accessory middle colic artery with arteriogram 6. Distal branch of accessory middle colic artery with arteriogram 7. Additional distal branch of accessory middle colic artery with arteriogram 8. Coil embolization MEDICATIONS: None ANESTHESIA/SEDATION: None. CONTRAST:  47mL OMNIPAQUE IOHEXOL 300 MG/ML  SOLN FLUOROSCOPY TIME:  Fluoroscopy Time: 8 minutes 0 seconds (464 mGy). COMPLICATIONS: None immediate. PROCEDURE: Informed consent was obtained from the patient following explanation of the procedure, risks, benefits and alternatives. The patient understands, agrees and consents for the procedure. All questions were addressed. A time out was performed prior to the initiation of the procedure. Maximal barrier sterile technique utilized including caps, mask, sterile gowns, sterile gloves, large sterile drape, hand hygiene, and Betadine prep. The was interrogated with ultrasound and found to be  widely patent. An image was obtained and stored for the medical record. Local anesthesia was attained by infiltration with 1% lidocaine. A small dermatotomy was made. Under real-time sonographic guidance, the vessel was punctured with a 21 gauge micropuncture needle. Using standard technique, the initial micro needle was exchanged over a 0.018 micro wire for a transitional 4 [place] micro sheath. The micro sheath was then exchanged over a 0.035 wire for a 5 French vascular sheath. A C2 cobra catheter was advanced over a Bentson wire into the abdominal aorta. The celiac axis was catheterized. Arteriography was performed. Conventional celiac arterial anatomy. No evidence of replaced middle colic artery. No evidence of active hemorrhage. The catheter was next advanced into the superior mesenteric artery. A superior mesenteric arteriogram was performed. There appears to be a faint focus of contrast extravasation in the region of the hepatic flexure. This appears to represent a watershed zone between the main and an accessory middle colic artery. A renegade ST microcatheter was advanced over a Fathom 16 wire. The first artery selected was the right colic artery. Arteriography was performed. No evidence of active hemorrhage. The artery was then used to select the accessory middle colic artery. Arteriography was performed. There is a small focus of active hemorrhage in the hepatic flexure. The active hemorrhage appears to be arising from a region of numerous small thread like arteries. In an effort to perform a more selective embolization, the microcatheter was  advanced into a more inferior distal branch of the accessory middle colic artery. Arteriography was performed. There is persistent mild hemorrhage. However, there is significant cross flow between the arterial branches. Coil embolization was initiated. This distal accessory branch was successfully coil embolized. The catheter was then advanced into the more superior  distal branch of the accessory middle colic artery. There is persistent contrast extravasation. Therefore, coil embolization was performed again. The coil pack was carried back into the main trunk of the accessory middle colic artery. Follow-up arteriography confirms complete stasis. While there is likely some collateral flow from the right were branch of the main middle colic artery, the decision was made not to pursue further embolization for fear of result in ischemia. This embolization should significantly decrease the pressure head to the site of bleeding facilitating healing. The catheters were removed. Hemostasis was attained with the assistance of a 6 French Angio-Seal device. IMPRESSION: 1. Small focus of active arterial extravasation at the hepatic flexure. 2. Successful coil embolization of an accessory branch of the middle colic artery. Signed, Criselda Peaches, MD, [facility] Radiology Electronically Signed   By: Jacqulynn Cadet M.D.   On: 09/01/2019 12:31   IR EMBO ART  VEN HEMORR LYMPH EXTRAV  INC GUIDE ROADMAPPING  Result Date: 09/01/2019 INDICATION: 69 year old female with acute active lower GI bleed. CT arteriogram localizes the bleeding to the hepatic flexure of the colon. EXAM: IR ULTRASOUND GUIDANCE VASC ACCESS RIGHT; ADDITIONAL ARTERIOGRAPHY; SELECTIVE VISCERAL ARTERIOGRAPHY; IR EMBO ART VEN HEMORR LYMPH EXTRAV INC GUIDE ROADMAPPING 1. Ultrasound-guided vascular access right common femoral artery 2. Celiac catheterization with arteriogram 3. Superior mesenteric artery catheterization with arteriogram 4. Right colic artery with arteriogram 5. Accessory middle colic artery with arteriogram 6. Distal branch of accessory middle colic artery with arteriogram 7. Additional distal branch of accessory middle colic artery with arteriogram 8. Coil embolization MEDICATIONS: None ANESTHESIA/SEDATION: None. CONTRAST:  70mL OMNIPAQUE IOHEXOL 300  MG/ML  SOLN FLUOROSCOPY TIME:  Fluoroscopy Time: 8 minutes 0 seconds (464 mGy). COMPLICATIONS: None immediate. PROCEDURE: Informed consent was obtained from the patient following explanation of the procedure, risks, benefits and alternatives. The patient understands, agrees and consents for the procedure. All questions were addressed. A time out was performed prior to the initiation of the procedure. Maximal barrier sterile technique utilized including caps, mask, sterile gowns, sterile gloves, large sterile drape, hand hygiene, and Betadine prep. The was interrogated with ultrasound and found to be widely patent. An image was obtained and stored for the medical record. Local anesthesia was attained by infiltration with 1% lidocaine. A small dermatotomy was made. Under real-time sonographic guidance, the vessel was punctured with a 21 gauge micropuncture needle. Using standard technique, the initial micro needle was exchanged over a 0.018 micro wire for a transitional 4 [place] micro sheath. The micro sheath was then exchanged over a 0.035 wire for a 5 French vascular sheath. A C2 cobra catheter was advanced over a Bentson wire into the abdominal aorta. The celiac axis was catheterized. Arteriography was performed. Conventional celiac arterial anatomy. No evidence of replaced middle colic artery. No evidence of active hemorrhage. The catheter was next advanced into the superior mesenteric artery. A superior mesenteric arteriogram was performed. There appears to be a faint focus of contrast extravasation in the region of the hepatic flexure. This appears to represent a watershed zone between the main and an accessory middle colic artery. A renegade ST microcatheter was advanced over a Fathom 26  wire. The first artery selected was the right colic artery. Arteriography was performed. No evidence of active hemorrhage. The artery was then used to select the accessory middle colic artery. Arteriography was performed. There  is a small focus of active hemorrhage in the hepatic flexure. The active hemorrhage appears to be arising from a region of numerous small thread like arteries. In an effort to perform a more selective embolization, the microcatheter was advanced into a more inferior distal branch of the accessory middle colic artery. Arteriography was performed. There is persistent mild hemorrhage. However, there is significant cross flow between the arterial branches. Coil embolization was initiated. This distal accessory branch was successfully coil embolized. The catheter was then advanced into the more superior distal branch of the accessory middle colic artery. There is persistent contrast extravasation. Therefore, coil embolization was performed again. The coil pack was carried back into the main trunk of the accessory middle colic artery. Follow-up arteriography confirms complete stasis. While there is likely some collateral flow from the right were branch of the main middle colic artery, the decision was made not to pursue further embolization for fear of result in ischemia. This embolization should significantly decrease the pressure head to the site of bleeding facilitating healing. The catheters were removed. Hemostasis was attained with the assistance of a 6 French Angio-Seal device. IMPRESSION: 1. Small focus of active arterial extravasation at the hepatic flexure. 2. Successful coil embolization of an accessory branch of the middle colic artery. Signed, Criselda Peaches, MD, [facility] Radiology Electronically Signed   By: Jacqulynn Cadet M.D.   On: 09/01/2019 12:31   Medications: . sodium chloride    . sodium chloride     . calcitRIOL  1 mcg Oral Q M,W,F-HD  . Chlorhexidine Gluconate Cloth  6 each Topical Q0600  . darbepoetin (ARANESP) injection - DIALYSIS  100 mcg Intravenous Q Wed-HD  . feeding supplement (NEPRO CARB STEADY)  237 mL Oral BID BM  .  feeding supplement (PRO-STAT SUGAR FREE 64)  30 mL Oral BID  . insulin aspart  0-6 Units Subcutaneous TID WC  . levothyroxine  50 mcg Oral QAC breakfast  . multivitamin  1 tablet Oral QHS  . pantoprazole  40 mg Oral Q0600  . phytonadione  10 mg Oral Daily

## 2019-09-02 NOTE — Progress Notes (Signed)
Blood transfusion complete.  Patient tolerate well. See flow sheet for post blood transfusion vital signs.  Pt. Continues to have bloody stools.  Provider Lennox Grumbles made aware.

## 2019-09-02 NOTE — Progress Notes (Signed)
Texted paged GI and surgery regarding pts events. Surgery called back... no new orders.

## 2019-09-02 NOTE — Progress Notes (Signed)
Nurse and nurse tech at bedside rounding on patient.  Patient reports that she had to have a bowel movement.  Patient with moderate amount of rectal bleeding noted.  Patient cleaned and repositioned.  Patient was in no acute distress.  Blood transfusion complete.  VS stable at this time.

## 2019-09-02 NOTE — TOC Progression Note (Signed)
Transition of Care (TOC) - Progression Note    Patient Details  Name: Roberta Bryant MRN: [id number] Date of Birth: 05/20/1951  Transition of Care (TOC) CM/SW Contact   W , LCSWA Phone Number: 09/02/2019, 3:28 PM  Clinical Narrative:    CSW met with pt's son outside pt's room for SNF choice.  Pt's son stated " My brother and I met and have decided on [facility] because pt has been there before.  CSW uploaded choice in hub.  TOC team will continue to follow for disposition.   Expected Discharge Plan: Skilled Nursing Facility Barriers to Discharge: Continued Medical Work up  Expected Discharge Plan and Services Expected Discharge Plan: Skilled Nursing Facility In-house Referral: Clinical Social Work Discharge Planning Services: CM Consult Post Acute Care Choice: Skilled Nursing Facility Living arrangements for the past 2 months: Single Family Home                                       Social Determinants of Health (SDOH) Interventions    Readmission Risk Interventions Readmission Risk Prevention Plan 08/27/2019  Transportation Screening Complete  Medication Review (RN Care Manager) Referral to Pharmacy  PCP or Specialist appointment within 3-5 days of discharge Not Complete  PCP/Specialist Appt Not Complete comments plan for poss SNF  HRI or Home Care Consult Complete  SW Recovery Care/Counseling Consult Complete  Palliative Care Screening Not Applicable  Skilled Nursing Facility Complete  Some recent data might be hidden   

## 2019-09-02 NOTE — Significant Event (Signed)
Nursing reports bleeding again, with significant oozing Almost finishing her 1 bag of PRBC. I Ordered stat NM bleeding scan and I called and notified NM tech. Nursing notifying surgery and GI regarding the events. Also spoke w/ Dr Rogue Bussing to transfuse while she is bleeding- seems to be tolerating well- last HD Friday. Vitals 96/45 mm hg,pateint not in distress,On 2l [place]. Monitor vss closely.

## 2019-09-02 NOTE — Consult Note (Signed)
Reason for Consult:LGIB Referring Physician: Lavetta Geier is an 69 y.o. female.  HPI: 69yo F hospitalized for lower GI bleed.  She underwent evaluation by gastroenterology with EGD and colonoscopy.  No bleeding seen on upper endoscopy.  She had multiple polyps removed from the right and left colon.  She also had left colon diverticuli noted.  Pathology showed tubular adenomas.  She developed further bleeding per rectum and underwent CT angio that showed active bleeding at the hepatic flexure.  She underwent arteriogram with embolization on 3/25 by interventional radiology.  She had some more bleeding last night and received 1 unit of PRBCs.  Interventional radiology does not feel they have a further role.  She denies abdominal pain.  She has been eating some but having nausea.  Past Medical History:  Diagnosis Date  . Anemia   . Aortic stenosis   . CHF (congestive heart failure) ([place])   . Childhood asthma   . Chronic upper back pain   . Coronary artery disease    Stent to left main coronary artery second of August 2017  . Decreased hearing   . ESRD (end stage renal disease) on dialysis [facility])    "MWF; [facility] in [place]" (01/06/2018)  . Gait abnormality 08/28/2016  . GERD (gastroesophageal reflux disease)   . Headache    "a few times/week" (10/30/2014)  . Headache syndrome 01/08/2017  . Heart murmur   . History of blood transfusion 1996   "related to menses"  . Hyperlipidemia   . Hypertension   . Hypothyroidism   . IBS (irritable bowel syndrome)   . Myocardial infarction ([place]) 2004  . Osteoarthritis   . Pneumonia    "years ago"  . Rotator cuff arthropathy of right shoulder   . Spondylosis of cervical spine 06/16/2017  . Stroke ([place]) 07/2016   mini stroke , right side of body weak-   . Type II diabetes mellitus ([place])     Past Surgical History:  Procedure Laterality Date  . A/V FISTULAGRAM Left 05/31/2018   Procedure: A/V FISTULAGRAM;  Surgeon: Serafina [place],  MD;  Location: [facility];  Service: Cardiovascular;  Laterality: Left;  . APPENDECTOMY    . AV FISTULA PLACEMENT Left 12/03/2006   "forearm"  . AV FISTULA REPAIR Left 11/25/2009   "forearm"  . BIOPSY  08/27/2019   Procedure: BIOPSY;  Surgeon: Thornton Park, MD;  Location: [facility] ENDOSCOPY;  Service: Gastroenterology;;  . Wilmon Pali RELEASE Right 02/11/2017   Procedure: RIGHT CARPAL TUNNEL RELEASE;  Surgeon: Daryll Brod, MD;  Location: [facility];  Service: Orthopedics;  Laterality: Right;  . CHOLECYSTECTOMY OPEN  1970's  . COLONOSCOPY W/ POLYPECTOMY  07/30/2010   Minimal sigmoid diverticulosis. Small internal hemorrhoids. Otherwise normal colonoscopy to TI.   Marland Kitchen COLONOSCOPY WITH PROPOFOL N/A 09/04/2019   Procedure: COLONOSCOPY WITH PROPOFOL;  Surgeon: Irene Shipper, MD;  Location: [facility] ENDOSCOPY;  Service: Endoscopy;  Laterality: N/A;  . CORONARY ANGIOPLASTY    . CORONARY ANGIOPLASTY WITH STENT PLACEMENT    . ESOPHAGOGASTRODUODENOSCOPY  10/21/2012   Gastric polyps- status post polypectomy. Mild gastritis.   Marland Kitchen ESOPHAGOGASTRODUODENOSCOPY (EGD) WITH PROPOFOL N/A 08/24/2019   Procedure: ESOPHAGOGASTRODUODENOSCOPY (EGD) WITH PROPOFOL;  Surgeon: Thornton Park, MD;  Location: [facility];  Service: Gastroenterology;  Laterality: N/A;  . INSERTION OF DIALYSIS CATHETER Right 2010   "chest"  . IR ANGIOGRAM SELECTIVE EACH ADDITIONAL VESSEL  09/01/2019  . IR ANGIOGRAM SELECTIVE EACH ADDITIONAL VESSEL  09/01/2019  .  IR ANGIOGRAM VISCERAL SELECTIVE  09/01/2019  . IR EMBO ART  VEN HEMORR LYMPH EXTRAV  INC GUIDE ROADMAPPING  09/01/2019  . IR US GUIDE VASC ACCESS RIGHT  09/01/2019  . PARATHYROIDECTOMY  2010?   Parathyroid autotransplantation   . POLYPECTOMY  08/28/2019   Procedure: POLYPECTOMY;  Surgeon: Irene Shipper, MD;  Location: [facility];  Service: Endoscopy;;  . REVERSE SHOULDER ARTHROPLASTY Right 01/06/2018  . REVERSE SHOULDER ARTHROPLASTY Right 01/06/2018   Procedure:  RIGHT REVERSE SHOULDER ARTHROPLASTY;  Surgeon: [name], MD;  Location: [facility];  Service: Orthopedics;  Laterality: Right;  . REVISON OF ARTERIOVENOUS FISTULA Left 12/04/2013   Procedure: REVISON OF ARTERIOVENOUS FISTULA;  Surgeon: Rosetta Posner, MD;  Location: [facility];  Service: Vascular;  Laterality: Left;  . REVISON OF ARTERIOVENOUS FISTULA Left 12/11/1605   Procedure: PLICATION OF LEFT BRACHIOCEPHALIC  ARTERIOVENOUS FISTULA;  Surgeon: Conrad Sophia, MD;  Location: [facility];  Service: Vascular;  Laterality: Left;  . SHOULDER ARTHROSCOPY WITH ROTATOR CUFF REPAIR AND SUBACROMIAL DECOMPRESSION Right 06/22/2017   Procedure: RIGHT SHOULDER ARTHROSCOPY WITH EXTENSIVE DEBRIDEMENT;  Surgeon: Mcarthur Rossetti, MD;  Location: [facility];  Service: Orthopedics;  Laterality: Right;  . TRIGGER FINGER RELEASE Right 10/22/2017   Procedure: RELEASE RIGHT INDEX TRIGGER FINGER/A-1 PULLEY;  Surgeon: Daryll Brod, MD;  Location: [facility];  Service: Orthopedics;  Laterality: Right;  . TUBAL LIGATION  1989  . UMBILICAL HERNIA REPAIR  1990's X 2    Family History  Problem Relation Age of Onset  . Epilepsy Mother   . Diabetes Mother   . Cancer Mother   . Alcohol abuse Father     Social History:  reports that she has quit smoking. Her smoking use included cigarettes. She quit after 3.00 years of use. She has never used smokeless tobacco. She reports previous drug use. Drugs: Marijuana and Hydromorphone. She reports that she does not drink alcohol.  Allergies:  Allergies  Allergen Reactions  . Sulfa Antibiotics Anaphylaxis  . Amoxicillin Hives    Has patient had a PCN reaction causing immediate rash, facial/tongue/throat swelling, SOB or lightheadedness with hypotension: No Has patient had a PCN reaction causing severe rash involving mucus membranes or skin necrosis: No Has patient had a PCN reaction that required hospitalization: No Has patient had a PCN reaction occurring within the last 10 years: Unknown If  all of the above answers are "NO", then may proceed with Cephalosporin use.   Marland Kitchen Crestor [Rosuvastatin Calcium] Other (See Comments)    Dizziness, couldn't walk  . Ibuprofen Hives  . [place] Senior [employer]  . Statins Hives  . Chlorphen-Phenyleph-Asa Rash  . Gabapentin Other (See Comments)    Dizzy    Medications: I have reviewed the patient's current medications.  Results for orders placed or performed during the hospital encounter of 08/25/2019 (from the past 48 hour(s))  Hemoglobin and hematocrit, blood     Status: Abnormal   Collection Time: 08/31/19  3:13 PM  Result Value Ref Range   Hemoglobin 7.1 (L) 12.0 - 15.0 g/dL   HCT 22.1 (L) 36.0 - 46.0 %    Comment: Performed at [facility], [address] [place], [place] [address]  Prepare RBC (crossmatch)     Status: None   Collection Time: 08/31/19  3:55 PM  Result Value Ref Range   Order Confirmation      ORDER PROCESSED BY BLOOD BANK Performed at [facility], [place] [address], [place], [place]  [address]   Type and screen [place]     Status: None   Collection Time: 08/31/19  4:12 PM  Result Value Ref Range   ABO/RH(D) O POS    Antibody Screen NEG    Sample Expiration 09/03/2019,2359    Unit Number [id number]    Blood Component Type RED CELLS,LR    Unit division 00    Status of Unit ISSUED,FINAL    Transfusion Status OK TO TRANSFUSE    Crossmatch Result Compatible    Unit Number [id number]    Blood Component Type RED CELLS,LR    Unit division 00    Status of Unit ISSUED,FINAL    Transfusion Status OK TO TRANSFUSE    Crossmatch Result      Compatible Performed at [facility], [place] [address] [place], [place] [address]   Glucose, capillary     Status: Abnormal   Collection Time: 08/31/19  5:16 PM  Result Value Ref Range   Glucose-Capillary 103 (H) 70 - 99 mg/dL    Comment: Glucose reference range applies only to samples taken after fasting for at least 8  hours.  Glucose, capillary     Status: None   Collection Time: 08/31/19  8:37 PM  Result Value Ref Range   Glucose-Capillary 88 70 - 99 mg/dL    Comment: Glucose reference range applies only to samples taken after fasting for at least 8 hours.  Basic metabolic panel     Status: Abnormal   Collection Time: 09/01/19  1:58 AM  Result Value Ref Range   Sodium 136 135 - 145 mmol/L   Potassium 4.5 3.5 - 5.1 mmol/L   Chloride 101 98 - 111 mmol/L   CO2 23 22 - 32 mmol/L   Glucose, Bld 85 70 - 99 mg/dL    Comment: Glucose reference range applies only to samples taken after fasting for at least 8 hours.   BUN 12 8 - 23 mg/dL   Creatinine, Ser 3.77 (H) 0.44 - 1.00 mg/dL   Calcium 7.0 (L) 8.9 - 10.3 mg/dL   GFR calc non Af Amer 12 (L) >60 mL/min   GFR calc Af Amer 13 (L) >60 mL/min   Anion gap 12 5 - 15    Comment: Performed at [facility] [address] [place], [place] [address]  DIC (disseminated intravasc coag) panel     Status: Abnormal   Collection Time: 09/01/19  1:58 AM  Result Value Ref Range   Prothrombin Time 17.1 (H) 11.4 - 15.2 seconds   INR 1.4 (H) 0.8 - 1.2    Comment: (NOTE) INR goal varies based on device and disease states.    aPTT 33 24 - 36 seconds   Fibrinogen 109 (L) 210 - 475 mg/dL   D-Dimer, Quant 1.43 (H) 0.00 - 0.50 ug/mL-FEU    Comment: (NOTE) At the manufacturer cut-off of 0.50 ug/mL FEU, this assay has been documented to exclude PE with a sensitivity and negative predictive value of 97 to 99%.  At this time, this assay has not been approved by the FDA to exclude DVT/VTE. Results should be correlated with clinical presentation.    Platelets 141 (L) 150 - 400 K/uL   Smear Review NO SCHISTOCYTES SEEN     Comment: Performed at [facility], [place] [address], [place], [place] [address]  CBC     Status: Abnormal   Collection Time: 09/01/19  1:58 AM  Result Value Ref Range  WBC 4.5 4.0 - 10.5 K/uL   RBC 2.63 (L) 3.87 - 5.11 MIL/uL   Hemoglobin  8.2 (L) 12.0 - 15.0 g/dL   HCT 25.8 (L) 36.0 - 46.0 %   MCV 98.1 80.0 - 100.0 fL   MCH 31.2 26.0 - 34.0 pg   MCHC 31.8 30.0 - 36.0 g/dL   RDW 23.0 (H) 11.5 - 15.5 %   Platelets 135 (L) 150 - 400 K/uL   nRBC 0.4 (H) 0.0 - 0.2 %    Comment: Performed at [facility] [address], [place], [place] [address]  Glucose, capillary     Status: None   Collection Time: 09/01/19  4:39 AM  Result Value Ref Range   Glucose-Capillary 92 70 - 99 mg/dL    Comment: Glucose reference range applies only to samples taken after fasting for at least 8 hours.  Glucose, capillary     Status: None   Collection Time: 09/01/19  8:01 AM  Result Value Ref Range   Glucose-Capillary 87 70 - 99 mg/dL    Comment: Glucose reference range applies only to samples taken after fasting for at least 8 hours.  CBC     Status: Abnormal   Collection Time: 09/01/19  8:04 AM  Result Value Ref Range   WBC 5.5 4.0 - 10.5 K/uL   RBC 2.74 (L) 3.87 - 5.11 MIL/uL   Hemoglobin 8.7 (L) 12.0 - 15.0 g/dL   HCT 26.8 (L) 36.0 - 46.0 %   MCV 97.8 80.0 - 100.0 fL   MCH 31.8 26.0 - 34.0 pg   MCHC 32.5 30.0 - 36.0 g/dL   RDW 23.6 (H) 11.5 - 15.5 %   Platelets 163 150 - 400 K/uL   nRBC 0.4 (H) 0.0 - 0.2 %    Comment: Performed at [facility] [address] [place], [place] [address]  Glucose, capillary     Status: None   Collection Time: 09/01/19 12:23 PM  Result Value Ref Range   Glucose-Capillary 91 70 - 99 mg/dL    Comment: Glucose reference range applies only to samples taken after fasting for at least 8 hours.  CBC     Status: Abnormal   Collection Time: 09/01/19  3:00 PM  Result Value Ref Range   WBC 5.7 4.0 - 10.5 K/uL   RBC 2.34 (L) 3.87 - 5.11 MIL/uL   Hemoglobin 7.3 (L) 12.0 - 15.0 g/dL   HCT 22.9 (L) 36.0 - 46.0 %   MCV 97.9 80.0 - 100.0 fL   MCH 31.2 26.0 - 34.0 pg   MCHC 31.9 30.0 - 36.0 g/dL   RDW 23.3 (H) 11.5 - 15.5 %   Platelets 159 150 - 400 K/uL   nRBC 0.3 (H) 0.0 - 0.2 %    Comment:  Performed at [facility] [address] [place], [place] [address]  Glucose, capillary     Status: None   Collection Time: 09/01/19  6:49 PM  Result Value Ref Range   Glucose-Capillary 87 70 - 99 mg/dL    Comment: Glucose reference range applies only to samples taken after fasting for at least 8 hours.  CBC     Status: Abnormal   Collection Time: 09/01/19  8:26 PM  Result Value Ref Range   WBC 5.6 4.0 - 10.5 K/uL   RBC 2.05 (L) 3.87 - 5.11 MIL/uL   Hemoglobin 6.6 (LL) 12.0 - 15.0 g/dL    Comment: REPEATED TO VERIFY THIS CRITICAL  RESULT HAS VERIFIED AND BEEN CALLED TO G.SATANELLA,RN BY MELISSA Maricao ON 03 26 2021 AT 2109, AND HAS BEEN READ BACK.     HCT 20.4 (L) 36.0 - 46.0 %   MCV 99.5 80.0 - 100.0 fL   MCH 32.2 26.0 - 34.0 pg   MCHC 32.4 30.0 - 36.0 g/dL   RDW 23.2 (H) 11.5 - 15.5 %   Platelets 114 (L) 150 - 400 K/uL    Comment: REPEATED TO VERIFY SPECIMEN CHECKED FOR CLOTS Immature Platelet Fraction may be clinically indicated, consider ordering this additional test [id number]    nRBC 0.4 (H) 0.0 - 0.2 %    Comment: Performed at [facility] [address], [place],  [address]  Glucose, capillary     Status: None   Collection Time: 09/01/19  9:49 PM  Result Value Ref Range   Glucose-Capillary 88 70 - 99 mg/dL    Comment: Glucose reference range applies only to samples taken after fasting for at least 8 hours.  Basic metabolic panel     Status: Abnormal   Collection Time: 09/02/19  4:44 AM  Result Value Ref Range   Sodium 138 135 - 145 mmol/L   Potassium 3.5 3.5 - 5.1 mmol/L   Chloride 103 98 - 111 mmol/L   CO2 24 22 - 32 mmol/L   Glucose, Bld 88 70 - 99 mg/dL    Comment: Glucose reference range applies only to samples taken after fasting for at least 8 hours.   BUN <5 (L) 8 - 23 mg/dL   Creatinine, Ser 2.70 (H) 0.44 - 1.00 mg/dL    Comment: DELTA CHECK NOTED   Calcium 7.2 (L) 8.9 - 10.3 mg/dL   GFR calc non Af Amer 17 (L) >60 mL/min   GFR calc  Af Amer 20 (L) >60 mL/min   Anion gap 11 5 - 15    Comment: Performed at [facility] [address] [place], [place] [address]  CBC     Status: Abnormal   Collection Time: 09/02/19  4:44 AM  Result Value Ref Range   WBC 6.3 4.0 - 10.5 K/uL   RBC 2.59 (L) 3.87 - 5.11 MIL/uL   Hemoglobin 7.8 (L) 12.0 - 15.0 g/dL   HCT 24.5 (L) 36.0 - 46.0 %   MCV 94.6 80.0 - 100.0 fL   MCH 30.1 26.0 - 34.0 pg   MCHC 31.8 30.0 - 36.0 g/dL   RDW 21.5 (H) 11.5 - 15.5 %   Platelets 124 (L) 150 - 400 K/uL    Comment: Immature Platelet Fraction may be clinically indicated, consider ordering this additional test [id number]    nRBC 0.6 (H) 0.0 - 0.2 %    Comment: Performed at [facility], [place] [address] [place], [place] [address]  Glucose, capillary     Status: None   Collection Time: 09/02/19  4:44 AM  Result Value Ref Range   Glucose-Capillary 75 70 - 99 mg/dL    Comment: Glucose reference range applies only to samples taken after fasting for at least 8 hours.  Glucose, capillary     Status: None   Collection Time: 09/02/19  8:08 AM  Result Value Ref Range   Glucose-Capillary 85 70 - 99 mg/dL    Comment: Glucose reference range applies only to samples taken after fasting for at least 8 hours.    CT ANGIO PELVIS W OR WO CONTRAST  Result Date: 08/31/2019 CLINICAL DATA:  GI bleeding, recurrent  dark burgundy hematochezia this morning, post EGD and colonoscopy, diverticular bleed EXAM: CT ANGIOGRAPHY CHEST, ABDOMEN AND PELVIS TECHNIQUE: Multidetector CT imaging through the chest, abdomen and pelvis was performed using the standard protocol during bolus administration of intravenous contrast. Multiplanar reconstructed images and MIPs were obtained and reviewed to evaluate the vascular anatomy. CONTRAST:  186mL OMNIPAQUE IOHEXOL 350 MG/ML SOLN IV COMPARISON:  CT abdomen and pelvis 04/05/2013 FINDINGS: CTA CHEST FINDINGS Cardiovascular: Atherosclerotic calcifications aorta, proximal great vessels,  and coronary arteries. Aorta normal caliber. No aortic aneurysm or dissection. Pulmonary arteries patent on non targeted exam. No pericardial effusion. Mediastinum/Nodes: Esophagus unremarkable.  No thoracic adenopathy. Lungs/Pleura: Lung apices not imaged by this protocol. Small RIGHT pleural effusion and compressive atelectasis of the RIGHT lower lobe. Question minimal patchy infiltrate RIGHT lung. No pneumothorax or definite mass/nodule. Musculoskeletal: RIGHT shoulder prosthesis. Osseous demineralization. No acute osseous findings. Review of the MIP images confirms the above findings. CTA ABDOMEN AND PELVIS FINDINGS VASCULAR Aorta: Normal caliber without aneurysm or dissection. Scattered atherosclerotic calcifications of abdominal aorta. Celiac: Mild plaque at origin of celiac artery. Minimal narrowing, less than 50%. No definite stenosis or thrombus. SMA: Plaque at SMA origin with slightly greater than 50% stenosis. Additional calcified plaque along the course of the SMA. Renals: Marked plaque at the origins of the renal arteries bilaterally. Renal arteries are diminutive, patient with end-stage renal disease. IMA: Patent though originates at significant plaque with high-grade stenosis of origin Inflow: Calcified plaque and minimal thrombus in the common iliac arteries bilaterally. External iliac arteries patent. Minimal plaque in the internal iliac arteries. Normal caliber iliac vessels. Veins: IVC, iliac veins, common femoral veins, portal vein, SMV and splenic vein appear patent. Review of the MIP images confirms the above findings. NON-VASCULAR Hepatobiliary: Post cholecystectomy.  Liver unremarkable. Pancreas: Atrophic pancreas without mass Spleen: Normal appearance Adrenals/Urinary Tract: Adrenal glands normal appearance. Markedly atrophic kidneys consistent with end-stage renal disease. Stomach/Bowel: Linear high attenuation focus within the stomach 10 mm diameter on precontrast image 21, question biopsy  clip versus ingested foreign body, persists throughout exam. Question polyps versus redundant mucosa along the lesser curvature, recommend correlation with prior EGD. Stomach otherwise unremarkable. Duodenal bulb and sweep normal appearance. Dilated proximal small bowel loops with segment of small bowel wall thickening in the RIGHT mid abdomen question enteritis. Distal small loops beyond this site are decompressed. Cecum and ascending colon normal appearance. Appendix not visualized. Bowel wall thickening of the hepatic flexure of the colon. High attenuation is seen within the lumen of the hepatic flexure on arterial phase (series 13, image 73) not seen on precontrast imaging and increasing on delayed images (series 19, image 45) consistent with a focus of active GI bleeding. Remainder of colon unremarkable. No additional colonic wall thickening or additional sites of active bleeding. Lymphatic: No adenopathy Reproductive: Atrophic uterus with unremarkable adnexa Other: No free air. Small amount of perihepatic free fluid. Small periumbilical hernia containing fat. Musculoskeletal: Osseous demineralization. Review of the MIP images confirms the above findings. IMPRESSION: Extensive atherosclerotic disease changes without evidence of aortic aneurysm or dissection. Plaque at the origins of the celiac and superior mesenteric arteries with less than 50% narrowing at celiac origin and greater than 50% narrowing at SMA origin. Focus of active GI bleeding at hepatic flexure of colon, which demonstrates wall thickening which could be the result of infection, ischemia, or inflammatory bowel disease. Additional thickened small bowel loop in the LEFT mid abdomen question enteritis, associated with dilatation of small bowel loops proximal  to this site suggesting a component of obstruction. Question gastric polyps versus redundant mucosa; recommend correlation with prior endoscopy. Small RIGHT pleural effusion and RIGHT basilar  atelectasis. Small amount of nonspecific perihepatic free fluid. Findings called to Dr. Lennox Grumbles on 08/31/2019 at 2048 hrs. Electronically Signed   By: Lavonia Dana M.D.   On: 08/31/2019 20:50   CT ANGIO ABDOMEN W &/OR WO CONTRAST  Result Date: 08/31/2019 CLINICAL DATA:  GI bleeding, recurrent dark burgundy hematochezia this morning, post EGD and colonoscopy, diverticular bleed EXAM: CT ANGIOGRAPHY CHEST, ABDOMEN AND PELVIS TECHNIQUE: Multidetector CT imaging through the chest, abdomen and pelvis was performed using the standard protocol during bolus administration of intravenous contrast. Multiplanar reconstructed images and MIPs were obtained and reviewed to evaluate the vascular anatomy. CONTRAST:  13mL OMNIPAQUE IOHEXOL 350 MG/ML SOLN IV COMPARISON:  CT abdomen and pelvis 04/05/2013 FINDINGS: CTA CHEST FINDINGS Cardiovascular: Atherosclerotic calcifications aorta, proximal great vessels, and coronary arteries. Aorta normal caliber. No aortic aneurysm or dissection. Pulmonary arteries patent on non targeted exam. No pericardial effusion. Mediastinum/Nodes: Esophagus unremarkable.  No thoracic adenopathy. Lungs/Pleura: Lung apices not imaged by this protocol. Small RIGHT pleural effusion and compressive atelectasis of the RIGHT lower lobe. Question minimal patchy infiltrate RIGHT lung. No pneumothorax or definite mass/nodule. Musculoskeletal: RIGHT shoulder prosthesis. Osseous demineralization. No acute osseous findings. Review of the MIP images confirms the above findings. CTA ABDOMEN AND PELVIS FINDINGS VASCULAR Aorta: Normal caliber without aneurysm or dissection. Scattered atherosclerotic calcifications of abdominal aorta. Celiac: Mild plaque at origin of celiac artery. Minimal narrowing, less than 50%. No definite stenosis or thrombus. SMA: Plaque at SMA origin with slightly greater than 50% stenosis. Additional calcified plaque along the course of the SMA. Renals: Marked plaque at the origins of the renal  arteries bilaterally. Renal arteries are diminutive, patient with end-stage renal disease. IMA: Patent though originates at significant plaque with high-grade stenosis of origin Inflow: Calcified plaque and minimal thrombus in the common iliac arteries bilaterally. External iliac arteries patent. Minimal plaque in the internal iliac arteries. Normal caliber iliac vessels. Veins: IVC, iliac veins, common femoral veins, portal vein, SMV and splenic vein appear patent. Review of the MIP images confirms the above findings. NON-VASCULAR Hepatobiliary: Post cholecystectomy.  Liver unremarkable. Pancreas: Atrophic pancreas without mass Spleen: Normal appearance Adrenals/Urinary Tract: Adrenal glands normal appearance. Markedly atrophic kidneys consistent with end-stage renal disease. Stomach/Bowel: Linear high attenuation focus within the stomach 10 mm diameter on precontrast image 21, question biopsy clip versus ingested foreign body, persists throughout exam. Question polyps versus redundant mucosa along the lesser curvature, recommend correlation with prior EGD. Stomach otherwise unremarkable. Duodenal bulb and sweep normal appearance. Dilated proximal small bowel loops with segment of small bowel wall thickening in the RIGHT mid abdomen question enteritis. Distal small loops beyond this site are decompressed. Cecum and ascending colon normal appearance. Appendix not visualized. Bowel wall thickening of the hepatic flexure of the colon. High attenuation is seen within the lumen of the hepatic flexure on arterial phase (series 13, image 73) not seen on precontrast imaging and increasing on delayed images (series 19, image 45) consistent with a focus of active GI bleeding. Remainder of colon unremarkable. No additional colonic wall thickening or additional sites of active bleeding. Lymphatic: No adenopathy Reproductive: Atrophic uterus with unremarkable adnexa Other: No free air. Small amount of perihepatic free fluid.  Small periumbilical hernia containing fat. Musculoskeletal: Osseous demineralization. Review of the MIP images confirms the above findings. IMPRESSION: Extensive atherosclerotic disease changes without evidence  of aortic aneurysm or dissection. Plaque at the origins of the celiac and superior mesenteric arteries with less than 50% narrowing at celiac origin and greater than 50% narrowing at SMA origin. Focus of active GI bleeding at hepatic flexure of colon, which demonstrates wall thickening which could be the result of infection, ischemia, or inflammatory bowel disease. Additional thickened small bowel loop in the LEFT mid abdomen question enteritis, associated with dilatation of small bowel loops proximal to this site suggesting a component of obstruction. Question gastric polyps versus redundant mucosa; recommend correlation with prior endoscopy. Small RIGHT pleural effusion and RIGHT basilar atelectasis. Small amount of nonspecific perihepatic free fluid. Findings called to Dr. Lennox Grumbles on 08/31/2019 at 2048 hrs. Electronically Signed   By: Lavonia Dana M.D.   On: 08/31/2019 20:50   IR Angiogram Visceral Selective  Result Date: 09/01/2019 INDICATION: 69 year old female with acute active lower GI bleed. CT arteriogram localizes the bleeding to the hepatic flexure of the colon. EXAM: IR ULTRASOUND GUIDANCE VASC ACCESS RIGHT; ADDITIONAL ARTERIOGRAPHY; SELECTIVE VISCERAL ARTERIOGRAPHY; IR EMBO ART VEN HEMORR LYMPH EXTRAV INC GUIDE ROADMAPPING 1. Ultrasound-guided vascular access right common femoral artery 2. Celiac catheterization with arteriogram 3. Superior mesenteric artery catheterization with arteriogram 4. Right colic artery with arteriogram 5. Accessory middle colic artery with arteriogram 6. Distal branch of accessory middle colic artery with arteriogram 7. Additional distal branch of accessory middle colic artery with arteriogram 8. Coil embolization MEDICATIONS: None ANESTHESIA/SEDATION: None. CONTRAST:   16mL OMNIPAQUE IOHEXOL 300 MG/ML  SOLN FLUOROSCOPY TIME:  Fluoroscopy Time: 8 minutes 0 seconds (464 mGy). COMPLICATIONS: None immediate. PROCEDURE: Informed consent was obtained from the patient following explanation of the procedure, risks, benefits and alternatives. The patient understands, agrees and consents for the procedure. All questions were addressed. A time out was performed prior to the initiation of the procedure. Maximal barrier sterile technique utilized including caps, mask, sterile gowns, sterile gloves, large sterile drape, hand hygiene, and Betadine prep. The was interrogated with ultrasound and found to be widely patent. An image was obtained and stored for the medical record. Local anesthesia was attained by infiltration with 1% lidocaine. A small dermatotomy was made. Under real-time sonographic guidance, the vessel was punctured with a 21 gauge micropuncture needle. Using standard technique, the initial micro needle was exchanged over a 0.018 micro wire for a transitional 4 [place] micro sheath. The micro sheath was then exchanged over a 0.035 wire for a 5 French vascular sheath. A C2 cobra catheter was advanced over a Bentson wire into the abdominal aorta. The celiac axis was catheterized. Arteriography was performed. Conventional celiac arterial anatomy. No evidence of replaced middle colic artery. No evidence of active hemorrhage. The catheter was next advanced into the superior mesenteric artery. A superior mesenteric arteriogram was performed. There appears to be a faint focus of contrast extravasation in the region of the hepatic flexure. This appears to represent a watershed zone between the main and an accessory middle colic artery. A renegade ST microcatheter was advanced over a Fathom 16 wire. The first artery selected was the right colic artery. Arteriography was performed. No evidence of active hemorrhage. The artery was then used to select the accessory middle colic artery.  Arteriography was performed. There is a small focus of active hemorrhage in the hepatic flexure. The active hemorrhage appears to be arising from a region of numerous small thread like arteries. In an effort to perform a more selective embolization, the microcatheter was advanced into a more inferior distal branch  of the accessory middle colic artery. Arteriography was performed. There is persistent mild hemorrhage. However, there is significant cross flow between the arterial branches. Coil embolization was initiated. This distal accessory branch was successfully coil embolized. The catheter was then advanced into the more superior distal branch of the accessory middle colic artery. There is persistent contrast extravasation. Therefore, coil embolization was performed again. The coil pack was carried back into the main trunk of the accessory middle colic artery. Follow-up arteriography confirms complete stasis. While there is likely some collateral flow from the right were branch of the main middle colic artery, the decision was made not to pursue further embolization for fear of result in ischemia. This embolization should significantly decrease the pressure head to the site of bleeding facilitating healing. The catheters were removed. Hemostasis was attained with the assistance of a 6 French Angio-Seal device. IMPRESSION: 1. Small focus of active arterial extravasation at the hepatic flexure. 2. Successful coil embolization of an accessory branch of the middle colic artery. Signed, Criselda Peaches, MD, [facility] Electronically Signed   By: Jacqulynn Cadet M.D.   On: 09/01/2019 12:31   IR Angiogram Selective Each Additional Vessel  Result Date: 09/01/2019 INDICATION: 69 year old female with acute active lower GI bleed. CT arteriogram localizes the bleeding to the hepatic flexure of the colon. EXAM: IR ULTRASOUND GUIDANCE VASC ACCESS RIGHT;  ADDITIONAL ARTERIOGRAPHY; SELECTIVE VISCERAL ARTERIOGRAPHY; IR EMBO ART VEN HEMORR LYMPH EXTRAV INC GUIDE ROADMAPPING 1. Ultrasound-guided vascular access right common femoral artery 2. Celiac catheterization with arteriogram 3. Superior mesenteric artery catheterization with arteriogram 4. Right colic artery with arteriogram 5. Accessory middle colic artery with arteriogram 6. Distal branch of accessory middle colic artery with arteriogram 7. Additional distal branch of accessory middle colic artery with arteriogram 8. Coil embolization MEDICATIONS: None ANESTHESIA/SEDATION: None. CONTRAST:  48mL OMNIPAQUE IOHEXOL 300 MG/ML  SOLN FLUOROSCOPY TIME:  Fluoroscopy Time: 8 minutes 0 seconds (464 mGy). COMPLICATIONS: None immediate. PROCEDURE: Informed consent was obtained from the patient following explanation of the procedure, risks, benefits and alternatives. The patient understands, agrees and consents for the procedure. All questions were addressed. A time out was performed prior to the initiation of the procedure. Maximal barrier sterile technique utilized including caps, mask, sterile gowns, sterile gloves, large sterile drape, hand hygiene, and Betadine prep. The was interrogated with ultrasound and found to be widely patent. An image was obtained and stored for the medical record. Local anesthesia was attained by infiltration with 1% lidocaine. A small dermatotomy was made. Under real-time sonographic guidance, the vessel was punctured with a 21 gauge micropuncture needle. Using standard technique, the initial micro needle was exchanged over a 0.018 micro wire for a transitional 4 [place] micro sheath. The micro sheath was then exchanged over a 0.035 wire for a 5 French vascular sheath. A C2 cobra catheter was advanced over a Bentson wire into the abdominal aorta. The celiac axis was catheterized. Arteriography was performed. Conventional celiac arterial anatomy. No evidence of replaced middle colic artery. No  evidence of active hemorrhage. The catheter was next advanced into the superior mesenteric artery. A superior mesenteric arteriogram was performed. There appears to be a faint focus of contrast extravasation in the region of the hepatic flexure. This appears to represent a watershed zone between the main and an accessory middle colic artery. A renegade ST microcatheter was advanced over a Fathom 16 wire. The first artery selected was the right colic artery. Arteriography was performed.  No evidence of active hemorrhage. The artery was then used to select the accessory middle colic artery. Arteriography was performed. There is a small focus of active hemorrhage in the hepatic flexure. The active hemorrhage appears to be arising from a region of numerous small thread like arteries. In an effort to perform a more selective embolization, the microcatheter was advanced into a more inferior distal branch of the accessory middle colic artery. Arteriography was performed. There is persistent mild hemorrhage. However, there is significant cross flow between the arterial branches. Coil embolization was initiated. This distal accessory branch was successfully coil embolized. The catheter was then advanced into the more superior distal branch of the accessory middle colic artery. There is persistent contrast extravasation. Therefore, coil embolization was performed again. The coil pack was carried back into the main trunk of the accessory middle colic artery. Follow-up arteriography confirms complete stasis. While there is likely some collateral flow from the right were branch of the main middle colic artery, the decision was made not to pursue further embolization for fear of result in ischemia. This embolization should significantly decrease the pressure head to the site of bleeding facilitating healing. The catheters were removed. Hemostasis was attained with the assistance of a 6 French Angio-Seal device. IMPRESSION: 1.  Small focus of active arterial extravasation at the hepatic flexure. 2. Successful coil embolization of an accessory branch of the middle colic artery. Signed, Criselda Peaches, MD, [facility] Radiology Electronically Signed   By: Jacqulynn Cadet M.D.   On: 09/01/2019 12:31   IR Angiogram Selective Each Additional Vessel  Result Date: 09/01/2019 INDICATION: 69 year old female with acute active lower GI bleed. CT arteriogram localizes the bleeding to the hepatic flexure of the colon. EXAM: IR ULTRASOUND GUIDANCE VASC ACCESS RIGHT; ADDITIONAL ARTERIOGRAPHY; SELECTIVE VISCERAL ARTERIOGRAPHY; IR EMBO ART VEN HEMORR LYMPH EXTRAV INC GUIDE ROADMAPPING 1. Ultrasound-guided vascular access right common femoral artery 2. Celiac catheterization with arteriogram 3. Superior mesenteric artery catheterization with arteriogram 4. Right colic artery with arteriogram 5. Accessory middle colic artery with arteriogram 6. Distal branch of accessory middle colic artery with arteriogram 7. Additional distal branch of accessory middle colic artery with arteriogram 8. Coil embolization MEDICATIONS: None ANESTHESIA/SEDATION: None. CONTRAST:  64mL OMNIPAQUE IOHEXOL 300 MG/ML  SOLN FLUOROSCOPY TIME:  Fluoroscopy Time: 8 minutes 0 seconds (464 mGy). COMPLICATIONS: None immediate. PROCEDURE: Informed consent was obtained from the patient following explanation of the procedure, risks, benefits and alternatives. The patient understands, agrees and consents for the procedure. All questions were addressed. A time out was performed prior to the initiation of the procedure. Maximal barrier sterile technique utilized including caps, mask, sterile gowns, sterile gloves, large sterile drape, hand hygiene, and Betadine prep. The was interrogated with ultrasound and found to be widely patent. An image was obtained and stored for the medical record. Local anesthesia was attained by  infiltration with 1% lidocaine. A small dermatotomy was made. Under real-time sonographic guidance, the vessel was punctured with a 21 gauge micropuncture needle. Using standard technique, the initial micro needle was exchanged over a 0.018 micro wire for a transitional 4 [place] micro sheath. The micro sheath was then exchanged over a 0.035 wire for a 5 French vascular sheath. A C2 cobra catheter was advanced over a Bentson wire into the abdominal aorta. The celiac axis was catheterized. Arteriography was performed. Conventional celiac arterial anatomy. No evidence of replaced middle colic artery. No evidence of active hemorrhage. The catheter was next advanced  into the superior mesenteric artery. A superior mesenteric arteriogram was performed. There appears to be a faint focus of contrast extravasation in the region of the hepatic flexure. This appears to represent a watershed zone between the main and an accessory middle colic artery. A renegade ST microcatheter was advanced over a Fathom 16 wire. The first artery selected was the right colic artery. Arteriography was performed. No evidence of active hemorrhage. The artery was then used to select the accessory middle colic artery. Arteriography was performed. There is a small focus of active hemorrhage in the hepatic flexure. The active hemorrhage appears to be arising from a region of numerous small thread like arteries. In an effort to perform a more selective embolization, the microcatheter was advanced into a more inferior distal branch of the accessory middle colic artery. Arteriography was performed. There is persistent mild hemorrhage. However, there is significant cross flow between the arterial branches. Coil embolization was initiated. This distal accessory branch was successfully coil embolized. The catheter was then advanced into the more superior distal branch of the accessory middle colic artery. There is persistent contrast extravasation.  Therefore, coil embolization was performed again. The coil pack was carried back into the main trunk of the accessory middle colic artery. Follow-up arteriography confirms complete stasis. While there is likely some collateral flow from the right were branch of the main middle colic artery, the decision was made not to pursue further embolization for fear of result in ischemia. This embolization should significantly decrease the pressure head to the site of bleeding facilitating healing. The catheters were removed. Hemostasis was attained with the assistance of a 6 French Angio-Seal device. IMPRESSION: 1. Small focus of active arterial extravasation at the hepatic flexure. 2. Successful coil embolization of an accessory branch of the middle colic artery. Signed, Criselda Peaches, MD, [place] [facility] Radiology Electronically Signed   By: Jacqulynn Cadet M.D.   On: 09/01/2019 12:31   IR US Guide Vasc Access Right  Result Date: 09/01/2019 INDICATION: 69 year old female with acute active lower GI bleed. CT arteriogram localizes the bleeding to the hepatic flexure of the colon. EXAM: IR ULTRASOUND GUIDANCE VASC ACCESS RIGHT; ADDITIONAL ARTERIOGRAPHY; SELECTIVE VISCERAL ARTERIOGRAPHY; IR EMBO ART VEN HEMORR LYMPH EXTRAV INC GUIDE ROADMAPPING 1. Ultrasound-guided vascular access right common femoral artery 2. Celiac catheterization with arteriogram 3. Superior mesenteric artery catheterization with arteriogram 4. Right colic artery with arteriogram 5. Accessory middle colic artery with arteriogram 6. Distal branch of accessory middle colic artery with arteriogram 7. Additional distal branch of accessory middle colic artery with arteriogram 8. Coil embolization MEDICATIONS: None ANESTHESIA/SEDATION: None. CONTRAST:  54mL OMNIPAQUE IOHEXOL 300 MG/ML  SOLN FLUOROSCOPY TIME:  Fluoroscopy Time: 8 minutes 0 seconds (464 mGy). COMPLICATIONS: None immediate. PROCEDURE:  Informed consent was obtained from the patient following explanation of the procedure, risks, benefits and alternatives. The patient understands, agrees and consents for the procedure. All questions were addressed. A time out was performed prior to the initiation of the procedure. Maximal barrier sterile technique utilized including caps, mask, sterile gowns, sterile gloves, large sterile drape, hand hygiene, and Betadine prep. The was interrogated with ultrasound and found to be widely patent. An image was obtained and stored for the medical record. Local anesthesia was attained by infiltration with 1% lidocaine. A small dermatotomy was made. Under real-time sonographic guidance, the vessel was punctured with a 21 gauge micropuncture needle. Using standard technique, the initial micro needle was exchanged over a 0.018 micro  wire for a transitional 4 [place] micro sheath. The micro sheath was then exchanged over a 0.035 wire for a 5 French vascular sheath. A C2 cobra catheter was advanced over a Bentson wire into the abdominal aorta. The celiac axis was catheterized. Arteriography was performed. Conventional celiac arterial anatomy. No evidence of replaced middle colic artery. No evidence of active hemorrhage. The catheter was next advanced into the superior mesenteric artery. A superior mesenteric arteriogram was performed. There appears to be a faint focus of contrast extravasation in the region of the hepatic flexure. This appears to represent a watershed zone between the main and an accessory middle colic artery. A renegade ST microcatheter was advanced over a Fathom 16 wire. The first artery selected was the right colic artery. Arteriography was performed. No evidence of active hemorrhage. The artery was then used to select the accessory middle colic artery. Arteriography was performed. There is a small focus of active hemorrhage in the hepatic flexure. The active hemorrhage appears to be arising from a region  of numerous small thread like arteries. In an effort to perform a more selective embolization, the microcatheter was advanced into a more inferior distal branch of the accessory middle colic artery. Arteriography was performed. There is persistent mild hemorrhage. However, there is significant cross flow between the arterial branches. Coil embolization was initiated. This distal accessory branch was successfully coil embolized. The catheter was then advanced into the more superior distal branch of the accessory middle colic artery. There is persistent contrast extravasation. Therefore, coil embolization was performed again. The coil pack was carried back into the main trunk of the accessory middle colic artery. Follow-up arteriography confirms complete stasis. While there is likely some collateral flow from the right were branch of the main middle colic artery, the decision was made not to pursue further embolization for fear of result in ischemia. This embolization should significantly decrease the pressure head to the site of bleeding facilitating healing. The catheters were removed. Hemostasis was attained with the assistance of a 6 French Angio-Seal device. IMPRESSION: 1. Small focus of active arterial extravasation at the hepatic flexure. 2. Successful coil embolization of an accessory branch of the middle colic artery. Signed, Criselda Peaches, MD, [place] [facility]iology Electronically Signed   By: Jacqulynn Cadet M.D.   On: 09/01/2019 12:31   IR EMBO ART  VEN HEMORR LYMPH EXTRAV  INC GUIDE ROADMAPPING  Result Date: 09/01/2019 INDICATION: 69 year old female with acute active lower GI bleed. CT arteriogram localizes the bleeding to the hepatic flexure of the colon. EXAM: IR ULTRASOUND GUIDANCE VASC ACCESS RIGHT; ADDITIONAL ARTERIOGRAPHY; SELECTIVE VISCERAL ARTERIOGRAPHY; IR EMBO ART VEN HEMORR LYMPH EXTRAV INC GUIDE ROADMAPPING 1. Ultrasound-guided  vascular access right common femoral artery 2. Celiac catheterization with arteriogram 3. Superior mesenteric artery catheterization with arteriogram 4. Right colic artery with arteriogram 5. Accessory middle colic artery with arteriogram 6. Distal branch of accessory middle colic artery with arteriogram 7. Additional distal branch of accessory middle colic artery with arteriogram 8. Coil embolization MEDICATIONS: None ANESTHESIA/SEDATION: None. CONTRAST:  61mL OMNIPAQUE IOHEXOL 300 MG/ML  SOLN FLUOROSCOPY TIME:  Fluoroscopy Time: 8 minutes 0 seconds (464 mGy). COMPLICATIONS: None immediate. PROCEDURE: Informed consent was obtained from the patient following explanation of the procedure, risks, benefits and alternatives. The patient understands, agrees and consents for the procedure. All questions were addressed. A time out was performed prior to the initiation of the procedure. Maximal barrier sterile technique utilized including caps, mask, sterile  gowns, sterile gloves, large sterile drape, hand hygiene, and Betadine prep. The was interrogated with ultrasound and found to be widely patent. An image was obtained and stored for the medical record. Local anesthesia was attained by infiltration with 1% lidocaine. A small dermatotomy was made. Under real-time sonographic guidance, the vessel was punctured with a 21 gauge micropuncture needle. Using standard technique, the initial micro needle was exchanged over a 0.018 micro wire for a transitional 4 [place] micro sheath. The micro sheath was then exchanged over a 0.035 wire for a 5 French vascular sheath. A C2 cobra catheter was advanced over a Bentson wire into the abdominal aorta. The celiac axis was catheterized. Arteriography was performed. Conventional celiac arterial anatomy. No evidence of replaced middle colic artery. No evidence of active hemorrhage. The catheter was next advanced into the superior mesenteric artery. A superior mesenteric arteriogram was  performed. There appears to be a faint focus of contrast extravasation in the region of the hepatic flexure. This appears to represent a watershed zone between the main and an accessory middle colic artery. A renegade ST microcatheter was advanced over a Fathom 16 wire. The first artery selected was the right colic artery. Arteriography was performed. No evidence of active hemorrhage. The artery was then used to select the accessory middle colic artery. Arteriography was performed. There is a small focus of active hemorrhage in the hepatic flexure. The active hemorrhage appears to be arising from a region of numerous small thread like arteries. In an effort to perform a more selective embolization, the microcatheter was advanced into a more inferior distal branch of the accessory middle colic artery. Arteriography was performed. There is persistent mild hemorrhage. However, there is significant cross flow between the arterial branches. Coil embolization was initiated. This distal accessory branch was successfully coil embolized. The catheter was then advanced into the more superior distal branch of the accessory middle colic artery. There is persistent contrast extravasation. Therefore, coil embolization was performed again. The coil pack was carried back into the main trunk of the accessory middle colic artery. Follow-up arteriography confirms complete stasis. While there is likely some collateral flow from the right were branch of the main middle colic artery, the decision was made not to pursue further embolization for fear of result in ischemia. This embolization should significantly decrease the pressure head to the site of bleeding facilitating healing. The catheters were removed. Hemostasis was attained with the assistance of a 6 French Angio-Seal device. IMPRESSION: 1. Small focus of active arterial extravasation at the hepatic flexure. 2. Successful coil embolization of an accessory branch of the middle  colic artery. Signed, Criselda Peaches, MD, [place] [facility] Radiology Electronically Signed   By: Jacqulynn Cadet M.D.   On: 09/01/2019 12:31    Review of Systems Blood pressure 115/63, pulse 99, temperature 99.3 F (37.4 C), temperature source Oral, resp. rate 20, height 4\' 10"  (1.473 m), weight 83.8 kg, SpO2 100 %. Physical Exam  Constitutional: She is oriented to person, place, and time. She appears well-developed. No distress.  HENT:  Head: Normocephalic.  Right Ear: External ear normal.  Left Ear: External ear normal.  Mouth/Throat: Oropharynx is clear and moist.  Eyes: Pupils are equal, round, and reactive to light. Right eye exhibits no discharge. Left eye exhibits no discharge. No scleral icterus.  Neck: No tracheal deviation present. No thyromegaly present.  Cardiovascular: Normal rate, regular rhythm, normal heart sounds and intact distal pulses.  Respiratory: Effort normal  and breath sounds normal. No respiratory distress. She has no wheezes. She has no rales.  GI: Soft. She exhibits no distension. There is no abdominal tenderness. There is no rebound and no guarding.  No hepatosplenomegaly  Musculoskeletal:        General: Normal range of motion.     Comments: Minimal edema  Neurological: She is alert and oriented to person, place, and time.  Skin: Skin is warm and dry.  Psychiatric: She has a normal mood and affect.  Alert and oriented    Assessment/Plan: LGIB -status post EGD, colonoscopy with polypectomies, and angioembolization of an area at the hepatic flexure as above.  Agree with transfusion.  If she continues to bleed may need partial colectomy.  It would be best to localize this a little better so we can avoid removing her whole colon.  I do not want to assume it is just from the hepatic flexure.  If she has further significant bleeding recommend tagged red blood cell scan.  We will follow closely and I discussed  this plan with her and her son.  Zenovia Jarred 09/02/2019, 12:26 PM

## 2019-09-02 NOTE — Progress Notes (Addendum)
[facility] Progress Note  CC:  Lower GI bleed   Subjective: She did not sleep well last night. No abdominal pain. RN confirms patient continues to pass small amounts of red blood from the rectum. No family at bedside at this time.   Objective:   08/16/2019 EGD: Afew gastric polyps biopsied: hyperplastic polyp with eroded granulation tissue and surface reactive change. Mild, nonbleeding, erosive gastropathy biopsied:Mild reactive gastropathy with some surface hyperplastic change, no H. Pylori.Duodenal nodule biopsied:peptic duodenitis.  .  08/15/2019 colonoscopy. 6 polyps removed located in right and left colon. Left colon diverticulosis. Solitary rectal ulcer, no stigmata of bleeding. Pathology shows tubular adenomas,noHGD. Ulcer not biopsied.   3/25 IR arteriogram with bleeding in the hepatic flexure and s/p embolization   Vital signs in last 24 hours: Temp:  [97.9 F (36.6 C)-99.3 F (37.4 C)] 99.3 F (37.4 C) (03/27 0456) Pulse Rate:  [84-116] 99 (03/27 0456) Resp:  [16-24] 20 (03/27 0456) BP: (76-115)/(47-83) 115/63 (03/27 0456) SpO2:  [96 %-100 %] 100 % (03/27 0456) Weight:  [83.5 kg-83.8 kg] 83.8 kg (03/26 1805) Last BM Date: 09/02/19(Bloody stool noted) General:  Lethargic but arousable.  Heart: RRR, bruit from her LUE fistual bruit auscultated in chest.  Pulm:  Diminished breath sounds throughout.  Abdomen: Soft, nondistended, nontender with hypoactive BS x 4 quads. Lower midline scar intact.  Extremities:  Without edema. Neurologic:  Alert and  oriented x 3. When awakened speech was clear, weakly moves all extremities.  Psych:  Alert and cooperative. Normal mood and affect.  Intake/Output from previous day: 03/26 0701 - 03/27 0700 In: 1060.7 [P.O.:240; I.V.:50; Blood:770.7] Out: 13 [Stool:2] Intake/Output this shift: No intake/output data recorded.  Lab Results: Recent Labs    09/01/19 1500 09/01/19 2026 09/02/19 0444  WBC  5.7 5.6 6.3  HGB 7.3* 6.6* 7.8*  HCT 22.9* 20.4* 24.5*  PLT 159 114* 124*   BMET Recent Labs    08/31/19 0141 09/01/19 0158 09/02/19 0444  NA 136 136 138  K 3.9 4.5 3.5  CL 100 101 103  CO2 24 23 24   GLUCOSE 114* 85 88  BUN 7* 12 <5*  CREATININE 2.88* 3.77* 2.70*  CALCIUM 7.1* 7.0* 7.2*   LFT No results for input(s): PROT, ALBUMIN, AST, ALT, ALKPHOS, BILITOT, BILIDIR, IBILI in the last 72 hours. PT/INR Recent Labs    09/01/19 0158  LABPROT 17.1*  INR 1.4*   Hepatitis Panel No results for input(s): HEPBSAG, HCVAB, HEPAIGM, HEPBIGM in the last 72 hours.  CT ANGIO PELVIS W OR WO CONTRAST  Result Date: 08/31/2019 CLINICAL DATA:  GI bleeding, recurrent dark burgundy hematochezia this morning, post EGD and colonoscopy, diverticular bleed EXAM: CT ANGIOGRAPHY CHEST, ABDOMEN AND PELVIS TECHNIQUE: Multidetector CT imaging through the chest, abdomen and pelvis was performed using the standard protocol during bolus administration of intravenous contrast. Multiplanar reconstructed images and MIPs were obtained and reviewed to evaluate the vascular anatomy. CONTRAST:  172mL OMNIPAQUE IOHEXOL 350 MG/ML SOLN IV COMPARISON:  CT abdomen and pelvis 04/05/2013 FINDINGS: CTA CHEST FINDINGS Cardiovascular: Atherosclerotic calcifications aorta, proximal great vessels, and coronary arteries. Aorta normal caliber. No aortic aneurysm or dissection. Pulmonary arteries patent on non targeted exam. No pericardial effusion. Mediastinum/Nodes: Esophagus unremarkable.  No thoracic adenopathy. Lungs/Pleura: Lung apices not imaged by this protocol. Small RIGHT pleural effusion and compressive atelectasis of the RIGHT lower lobe. Question minimal patchy infiltrate RIGHT lung. No pneumothorax or definite mass/nodule. Musculoskeletal: RIGHT shoulder prosthesis. Osseous demineralization. No acute osseous findings.  Review of the MIP images confirms the above findings. CTA ABDOMEN AND PELVIS FINDINGS VASCULAR Aorta:  Normal caliber without aneurysm or dissection. Scattered atherosclerotic calcifications of abdominal aorta. Celiac: Mild plaque at origin of celiac artery. Minimal narrowing, less than 50%. No definite stenosis or thrombus. SMA: Plaque at SMA origin with slightly greater than 50% stenosis. Additional calcified plaque along the course of the SMA. Renals: Marked plaque at the origins of the renal arteries bilaterally. Renal arteries are diminutive, patient with end-stage renal disease. IMA: Patent though originates at significant plaque with high-grade stenosis of origin Inflow: Calcified plaque and minimal thrombus in the common iliac arteries bilaterally. External iliac arteries patent. Minimal plaque in the internal iliac arteries. Normal caliber iliac vessels. Veins: IVC, iliac veins, common femoral veins, portal vein, SMV and splenic vein appear patent. Review of the MIP images confirms the above findings. NON-VASCULAR Hepatobiliary: Post cholecystectomy.  Liver unremarkable. Pancreas: Atrophic pancreas without mass Spleen: Normal appearance Adrenals/Urinary Tract: Adrenal glands normal appearance. Markedly atrophic kidneys consistent with end-stage renal disease. Stomach/Bowel: Linear high attenuation focus within the stomach 10 mm diameter on precontrast image 21, question biopsy clip versus ingested foreign body, persists throughout exam. Question polyps versus redundant mucosa along the lesser curvature, recommend correlation with prior EGD. Stomach otherwise unremarkable. Duodenal bulb and sweep normal appearance. Dilated proximal small bowel loops with segment of small bowel wall thickening in the RIGHT mid abdomen question enteritis. Distal small loops beyond this site are decompressed. Cecum and ascending colon normal appearance. Appendix not visualized. Bowel wall thickening of the hepatic flexure of the colon. High attenuation is seen within the lumen of the hepatic flexure on arterial phase (series 13,  image 73) not seen on precontrast imaging and increasing on delayed images (series 19, image 45) consistent with a focus of active GI bleeding. Remainder of colon unremarkable. No additional colonic wall thickening or additional sites of active bleeding. Lymphatic: No adenopathy Reproductive: Atrophic uterus with unremarkable adnexa Other: No free air. Small amount of perihepatic free fluid. Small periumbilical hernia containing fat. Musculoskeletal: Osseous demineralization. Review of the MIP images confirms the above findings. IMPRESSION: Extensive atherosclerotic disease changes without evidence of aortic aneurysm or dissection. Plaque at the origins of the celiac and superior mesenteric arteries with less than 50% narrowing at celiac origin and greater than 50% narrowing at SMA origin. Focus of active GI bleeding at hepatic flexure of colon, which demonstrates wall thickening which could be the result of infection, ischemia, or inflammatory bowel disease. Additional thickened small bowel loop in the LEFT mid abdomen question enteritis, associated with dilatation of small bowel loops proximal to this site suggesting a component of obstruction. Question gastric polyps versus redundant mucosa; recommend correlation with prior endoscopy. Small RIGHT pleural effusion and RIGHT basilar atelectasis. Small amount of nonspecific perihepatic free fluid. Findings called to Dr. Lennox Grumbles on 08/31/2019 at 2048 hrs. Electronically Signed   By: Lavonia Dana M.D.   On: 08/31/2019 20:50   CT ANGIO ABDOMEN W &/OR WO CONTRAST  Result Date: 08/31/2019 CLINICAL DATA:  GI bleeding, recurrent dark burgundy hematochezia this morning, post EGD and colonoscopy, diverticular bleed EXAM: CT ANGIOGRAPHY CHEST, ABDOMEN AND PELVIS TECHNIQUE: Multidetector CT imaging through the chest, abdomen and pelvis was performed using the standard protocol during bolus administration of intravenous contrast. Multiplanar reconstructed images and MIPs were  obtained and reviewed to evaluate the vascular anatomy. CONTRAST:  165mL OMNIPAQUE IOHEXOL 350 MG/ML SOLN IV COMPARISON:  CT abdomen and pelvis 04/05/2013 FINDINGS: CTA  CHEST FINDINGS Cardiovascular: Atherosclerotic calcifications aorta, proximal great vessels, and coronary arteries. Aorta normal caliber. No aortic aneurysm or dissection. Pulmonary arteries patent on non targeted exam. No pericardial effusion. Mediastinum/Nodes: Esophagus unremarkable.  No thoracic adenopathy. Lungs/Pleura: Lung apices not imaged by this protocol. Small RIGHT pleural effusion and compressive atelectasis of the RIGHT lower lobe. Question minimal patchy infiltrate RIGHT lung. No pneumothorax or definite mass/nodule. Musculoskeletal: RIGHT shoulder prosthesis. Osseous demineralization. No acute osseous findings. Review of the MIP images confirms the above findings. CTA ABDOMEN AND PELVIS FINDINGS VASCULAR Aorta: Normal caliber without aneurysm or dissection. Scattered atherosclerotic calcifications of abdominal aorta. Celiac: Mild plaque at origin of celiac artery. Minimal narrowing, less than 50%. No definite stenosis or thrombus. SMA: Plaque at SMA origin with slightly greater than 50% stenosis. Additional calcified plaque along the course of the SMA. Renals: Marked plaque at the origins of the renal arteries bilaterally. Renal arteries are diminutive, patient with end-stage renal disease. IMA: Patent though originates at significant plaque with high-grade stenosis of origin Inflow: Calcified plaque and minimal thrombus in the common iliac arteries bilaterally. External iliac arteries patent. Minimal plaque in the internal iliac arteries. Normal caliber iliac vessels. Veins: IVC, iliac veins, common femoral veins, portal vein, SMV and splenic vein appear patent. Review of the MIP images confirms the above findings. NON-VASCULAR Hepatobiliary: Post cholecystectomy.  Liver unremarkable. Pancreas: Atrophic pancreas without mass Spleen:  Normal appearance Adrenals/Urinary Tract: Adrenal glands normal appearance. Markedly atrophic kidneys consistent with end-stage renal disease. Stomach/Bowel: Linear high attenuation focus within the stomach 10 mm diameter on precontrast image 21, question biopsy clip versus ingested foreign body, persists throughout exam. Question polyps versus redundant mucosa along the lesser curvature, recommend correlation with prior EGD. Stomach otherwise unremarkable. Duodenal bulb and sweep normal appearance. Dilated proximal small bowel loops with segment of small bowel wall thickening in the RIGHT mid abdomen question enteritis. Distal small loops beyond this site are decompressed. Cecum and ascending colon normal appearance. Appendix not visualized. Bowel wall thickening of the hepatic flexure of the colon. High attenuation is seen within the lumen of the hepatic flexure on arterial phase (series 13, image 73) not seen on precontrast imaging and increasing on delayed images (series 19, image 45) consistent with a focus of active GI bleeding. Remainder of colon unremarkable. No additional colonic wall thickening or additional sites of active bleeding. Lymphatic: No adenopathy Reproductive: Atrophic uterus with unremarkable adnexa Other: No free air. Small amount of perihepatic free fluid. Small periumbilical hernia containing fat. Musculoskeletal: Osseous demineralization. Review of the MIP images confirms the above findings. IMPRESSION: Extensive atherosclerotic disease changes without evidence of aortic aneurysm or dissection. Plaque at the origins of the celiac and superior mesenteric arteries with less than 50% narrowing at celiac origin and greater than 50% narrowing at SMA origin. Focus of active GI bleeding at hepatic flexure of colon, which demonstrates wall thickening which could be the result of infection, ischemia, or inflammatory bowel disease. Additional thickened small bowel loop in the LEFT mid abdomen  question enteritis, associated with dilatation of small bowel loops proximal to this site suggesting a component of obstruction. Question gastric polyps versus redundant mucosa; recommend correlation with prior endoscopy. Small RIGHT pleural effusion and RIGHT basilar atelectasis. Small amount of nonspecific perihepatic free fluid. Findings called to Dr. Lennox Grumbles on 08/31/2019 at 2048 hrs. Electronically Signed   By: Lavonia Dana M.D.   On: 08/31/2019 20:50   IR Angiogram Visceral Selective  Result Date: 09/01/2019 INDICATION: 69 year old female with  acute active lower GI bleed. CT arteriogram localizes the bleeding to the hepatic flexure of the colon. EXAM: IR ULTRASOUND GUIDANCE VASC ACCESS RIGHT; ADDITIONAL ARTERIOGRAPHY; SELECTIVE VISCERAL ARTERIOGRAPHY; IR EMBO ART VEN HEMORR LYMPH EXTRAV INC GUIDE ROADMAPPING 1. Ultrasound-guided vascular access right common femoral artery 2. Celiac catheterization with arteriogram 3. Superior mesenteric artery catheterization with arteriogram 4. Right colic artery with arteriogram 5. Accessory middle colic artery with arteriogram 6. Distal branch of accessory middle colic artery with arteriogram 7. Additional distal branch of accessory middle colic artery with arteriogram 8. Coil embolization MEDICATIONS: None ANESTHESIA/SEDATION: None. CONTRAST:  77mL OMNIPAQUE IOHEXOL 300 MG/ML  SOLN FLUOROSCOPY TIME:  Fluoroscopy Time: 8 minutes 0 seconds (464 mGy). COMPLICATIONS: None immediate. PROCEDURE: Informed consent was obtained from the patient following explanation of the procedure, risks, benefits and alternatives. The patient understands, agrees and consents for the procedure. All questions were addressed. A time out was performed prior to the initiation of the procedure. Maximal barrier sterile technique utilized including caps, mask, sterile gowns, sterile gloves, large sterile drape, hand hygiene, and Betadine prep. The was interrogated with ultrasound and found to be widely  patent. An image was obtained and stored for the medical record. Local anesthesia was attained by infiltration with 1% lidocaine. A small dermatotomy was made. Under real-time sonographic guidance, the vessel was punctured with a 21 gauge micropuncture needle. Using standard technique, the initial micro needle was exchanged over a 0.018 micro wire for a transitional 4 [place] micro sheath. The micro sheath was then exchanged over a 0.035 wire for a 5 French vascular sheath. A C2 cobra catheter was advanced over a Bentson wire into the abdominal aorta. The celiac axis was catheterized. Arteriography was performed. Conventional celiac arterial anatomy. No evidence of replaced middle colic artery. No evidence of active hemorrhage. The catheter was next advanced into the superior mesenteric artery. A superior mesenteric arteriogram was performed. There appears to be a faint focus of contrast extravasation in the region of the hepatic flexure. This appears to represent a watershed zone between the main and an accessory middle colic artery. A renegade ST microcatheter was advanced over a Fathom 16 wire. The first artery selected was the right colic artery. Arteriography was performed. No evidence of active hemorrhage. The artery was then used to select the accessory middle colic artery. Arteriography was performed. There is a small focus of active hemorrhage in the hepatic flexure. The active hemorrhage appears to be arising from a region of numerous small thread like arteries. In an effort to perform a more selective embolization, the microcatheter was advanced into a more inferior distal branch of the accessory middle colic artery. Arteriography was performed. There is persistent mild hemorrhage. However, there is significant cross flow between the arterial branches. Coil embolization was initiated. This distal accessory branch was successfully coil embolized. The catheter was then advanced into the more superior distal  branch of the accessory middle colic artery. There is persistent contrast extravasation. Therefore, coil embolization was performed again. The coil pack was carried back into the main trunk of the accessory middle colic artery. Follow-up arteriography confirms complete stasis. While there is likely some collateral flow from the right were branch of the main middle colic artery, the decision was made not to pursue further embolization for fear of result in ischemia. This embolization should significantly decrease the pressure head to the site of bleeding facilitating healing. The catheters were removed. Hemostasis was attained with the assistance of a 6 French Angio-Seal device. IMPRESSION:  1. Small focus of active arterial extravasation at the hepatic flexure. 2. Successful coil embolization of an accessory branch of the middle colic artery. Signed, Criselda Peaches, MD, [facility] Electronically Signed   By: Jacqulynn Cadet M.D.   On: 09/01/2019 12:31   IR Angiogram Selective Each Additional Vessel  Result Date: 09/01/2019 INDICATION: 69 year old female with acute active lower GI bleed. CT arteriogram localizes the bleeding to the hepatic flexure of the colon. EXAM: IR ULTRASOUND GUIDANCE VASC ACCESS RIGHT; ADDITIONAL ARTERIOGRAPHY; SELECTIVE VISCERAL ARTERIOGRAPHY; IR EMBO ART VEN HEMORR LYMPH EXTRAV INC GUIDE ROADMAPPING 1. Ultrasound-guided vascular access right common femoral artery 2. Celiac catheterization with arteriogram 3. Superior mesenteric artery catheterization with arteriogram 4. Right colic artery with arteriogram 5. Accessory middle colic artery with arteriogram 6. Distal branch of accessory middle colic artery with arteriogram 7. Additional distal branch of accessory middle colic artery with arteriogram 8. Coil embolization MEDICATIONS: None ANESTHESIA/SEDATION: None. CONTRAST:  64mL OMNIPAQUE IOHEXOL 300 MG/ML  SOLN  FLUOROSCOPY TIME:  Fluoroscopy Time: 8 minutes 0 seconds (464 mGy). COMPLICATIONS: None immediate. PROCEDURE: Informed consent was obtained from the patient following explanation of the procedure, risks, benefits and alternatives. The patient understands, agrees and consents for the procedure. All questions were addressed. A time out was performed prior to the initiation of the procedure. Maximal barrier sterile technique utilized including caps, mask, sterile gowns, sterile gloves, large sterile drape, hand hygiene, and Betadine prep. The was interrogated with ultrasound and found to be widely patent. An image was obtained and stored for the medical record. Local anesthesia was attained by infiltration with 1% lidocaine. A small dermatotomy was made. Under real-time sonographic guidance, the vessel was punctured with a 21 gauge micropuncture needle. Using standard technique, the initial micro needle was exchanged over a 0.018 micro wire for a transitional 4 [place] micro sheath. The micro sheath was then exchanged over a 0.035 wire for a 5 French vascular sheath. A C2 cobra catheter was advanced over a Bentson wire into the abdominal aorta. The celiac axis was catheterized. Arteriography was performed. Conventional celiac arterial anatomy. No evidence of replaced middle colic artery. No evidence of active hemorrhage. The catheter was next advanced into the superior mesenteric artery. A superior mesenteric arteriogram was performed. There appears to be a faint focus of contrast extravasation in the region of the hepatic flexure. This appears to represent a watershed zone between the main and an accessory middle colic artery. A renegade ST microcatheter was advanced over a Fathom 16 wire. The first artery selected was the right colic artery. Arteriography was performed. No evidence of active hemorrhage. The artery was then used to select the accessory middle colic artery. Arteriography was performed. There is a small  focus of active hemorrhage in the hepatic flexure. The active hemorrhage appears to be arising from a region of numerous small thread like arteries. In an effort to perform a more selective embolization, the microcatheter was advanced into a more inferior distal branch of the accessory middle colic artery. Arteriography was performed. There is persistent mild hemorrhage. However, there is significant cross flow between the arterial branches. Coil embolization was initiated. This distal accessory branch was successfully coil embolized. The catheter was then advanced into the more superior distal branch of the accessory middle colic artery. There is persistent contrast extravasation. Therefore, coil embolization was performed again. The coil pack was carried back into the main trunk of the accessory middle colic artery. Follow-up arteriography confirms  complete stasis. While there is likely some collateral flow from the right were branch of the main middle colic artery, the decision was made not to pursue further embolization for fear of result in ischemia. This embolization should significantly decrease the pressure head to the site of bleeding facilitating healing. The catheters were removed. Hemostasis was attained with the assistance of a 6 French Angio-Seal device. IMPRESSION: 1. Small focus of active arterial extravasation at the hepatic flexure. 2. Successful coil embolization of an accessory branch of the middle colic artery. Signed, Criselda Peaches, MD, [facility] Radiology Electronically Signed   By: Jacqulynn Cadet M.D.   On: 09/01/2019 12:31   IR Angiogram Selective Each Additional Vessel  Result Date: 09/01/2019 INDICATION: 69 year old female with acute active lower GI bleed. CT arteriogram localizes the bleeding to the hepatic flexure of the colon. EXAM: IR ULTRASOUND GUIDANCE VASC ACCESS RIGHT; ADDITIONAL ARTERIOGRAPHY; SELECTIVE VISCERAL  ARTERIOGRAPHY; IR EMBO ART VEN HEMORR LYMPH EXTRAV INC GUIDE ROADMAPPING 1. Ultrasound-guided vascular access right common femoral artery 2. Celiac catheterization with arteriogram 3. Superior mesenteric artery catheterization with arteriogram 4. Right colic artery with arteriogram 5. Accessory middle colic artery with arteriogram 6. Distal branch of accessory middle colic artery with arteriogram 7. Additional distal branch of accessory middle colic artery with arteriogram 8. Coil embolization MEDICATIONS: None ANESTHESIA/SEDATION: None. CONTRAST:  80mL OMNIPAQUE IOHEXOL 300 MG/ML  SOLN FLUOROSCOPY TIME:  Fluoroscopy Time: 8 minutes 0 seconds (464 mGy). COMPLICATIONS: None immediate. PROCEDURE: Informed consent was obtained from the patient following explanation of the procedure, risks, benefits and alternatives. The patient understands, agrees and consents for the procedure. All questions were addressed. A time out was performed prior to the initiation of the procedure. Maximal barrier sterile technique utilized including caps, mask, sterile gowns, sterile gloves, large sterile drape, hand hygiene, and Betadine prep. The was interrogated with ultrasound and found to be widely patent. An image was obtained and stored for the medical record. Local anesthesia was attained by infiltration with 1% lidocaine. A small dermatotomy was made. Under real-time sonographic guidance, the vessel was punctured with a 21 gauge micropuncture needle. Using standard technique, the initial micro needle was exchanged over a 0.018 micro wire for a transitional 4 [place] micro sheath. The micro sheath was then exchanged over a 0.035 wire for a 5 French vascular sheath. A C2 cobra catheter was advanced over a Bentson wire into the abdominal aorta. The celiac axis was catheterized. Arteriography was performed. Conventional celiac arterial anatomy. No evidence of replaced middle colic artery. No evidence of active hemorrhage. The catheter was  next advanced into the superior mesenteric artery. A superior mesenteric arteriogram was performed. There appears to be a faint focus of contrast extravasation in the region of the hepatic flexure. This appears to represent a watershed zone between the main and an accessory middle colic artery. A renegade ST microcatheter was advanced over a Fathom 16 wire. The first artery selected was the right colic artery. Arteriography was performed. No evidence of active hemorrhage. The artery was then used to select the accessory middle colic artery. Arteriography was performed. There is a small focus of active hemorrhage in the hepatic flexure. The active hemorrhage appears to be arising from a region of numerous small thread like arteries. In an effort to perform a more selective embolization, the microcatheter was advanced into a more inferior distal branch of the accessory middle colic artery. Arteriography was performed. There is persistent mild hemorrhage. However, there  is significant cross flow between the arterial branches. Coil embolization was initiated. This distal accessory branch was successfully coil embolized. The catheter was then advanced into the more superior distal branch of the accessory middle colic artery. There is persistent contrast extravasation. Therefore, coil embolization was performed again. The coil pack was carried back into the main trunk of the accessory middle colic artery. Follow-up arteriography confirms complete stasis. While there is likely some collateral flow from the right were branch of the main middle colic artery, the decision was made not to pursue further embolization for fear of result in ischemia. This embolization should significantly decrease the pressure head to the site of bleeding facilitating healing. The catheters were removed. Hemostasis was attained with the assistance of a 6 French Angio-Seal device. IMPRESSION: 1. Small focus of active arterial extravasation at the  hepatic flexure. 2. Successful coil embolization of an accessory branch of the middle colic artery. Signed, Criselda Peaches, MD, [place] [facility] Radiology Electronically Signed   By: Jacqulynn Cadet M.D.   On: 09/01/2019 12:31   IR US Guide Vasc Access Right  Result Date: 09/01/2019 INDICATION: 69 year old female with acute active lower GI bleed. CT arteriogram localizes the bleeding to the hepatic flexure of the colon. EXAM: IR ULTRASOUND GUIDANCE VASC ACCESS RIGHT; ADDITIONAL ARTERIOGRAPHY; SELECTIVE VISCERAL ARTERIOGRAPHY; IR EMBO ART VEN HEMORR LYMPH EXTRAV INC GUIDE ROADMAPPING 1. Ultrasound-guided vascular access right common femoral artery 2. Celiac catheterization with arteriogram 3. Superior mesenteric artery catheterization with arteriogram 4. Right colic artery with arteriogram 5. Accessory middle colic artery with arteriogram 6. Distal branch of accessory middle colic artery with arteriogram 7. Additional distal branch of accessory middle colic artery with arteriogram 8. Coil embolization MEDICATIONS: None ANESTHESIA/SEDATION: None. CONTRAST:  45mL OMNIPAQUE IOHEXOL 300 MG/ML  SOLN FLUOROSCOPY TIME:  Fluoroscopy Time: 8 minutes 0 seconds (464 mGy). COMPLICATIONS: None immediate. PROCEDURE: Informed consent was obtained from the patient following explanation of the procedure, risks, benefits and alternatives. The patient understands, agrees and consents for the procedure. All questions were addressed. A time out was performed prior to the initiation of the procedure. Maximal barrier sterile technique utilized including caps, mask, sterile gowns, sterile gloves, large sterile drape, hand hygiene, and Betadine prep. The was interrogated with ultrasound and found to be widely patent. An image was obtained and stored for the medical record. Local anesthesia was attained by infiltration with 1% lidocaine. A small dermatotomy was made. Under real-time  sonographic guidance, the vessel was punctured with a 21 gauge micropuncture needle. Using standard technique, the initial micro needle was exchanged over a 0.018 micro wire for a transitional 4 [place] micro sheath. The micro sheath was then exchanged over a 0.035 wire for a 5 French vascular sheath. A C2 cobra catheter was advanced over a Bentson wire into the abdominal aorta. The celiac axis was catheterized. Arteriography was performed. Conventional celiac arterial anatomy. No evidence of replaced middle colic artery. No evidence of active hemorrhage. The catheter was next advanced into the superior mesenteric artery. A superior mesenteric arteriogram was performed. There appears to be a faint focus of contrast extravasation in the region of the hepatic flexure. This appears to represent a watershed zone between the main and an accessory middle colic artery. A renegade ST microcatheter was advanced over a Fathom 16 wire. The first artery selected was the right colic artery. Arteriography was performed. No evidence of active hemorrhage. The artery was then used to select the accessory middle colic  artery. Arteriography was performed. There is a small focus of active hemorrhage in the hepatic flexure. The active hemorrhage appears to be arising from a region of numerous small thread like arteries. In an effort to perform a more selective embolization, the microcatheter was advanced into a more inferior distal branch of the accessory middle colic artery. Arteriography was performed. There is persistent mild hemorrhage. However, there is significant cross flow between the arterial branches. Coil embolization was initiated. This distal accessory branch was successfully coil embolized. The catheter was then advanced into the more superior distal branch of the accessory middle colic artery. There is persistent contrast extravasation. Therefore, coil embolization was performed again. The coil pack was carried back into  the main trunk of the accessory middle colic artery. Follow-up arteriography confirms complete stasis. While there is likely some collateral flow from the right were branch of the main middle colic artery, the decision was made not to pursue further embolization for fear of result in ischemia. This embolization should significantly decrease the pressure head to the site of bleeding facilitating healing. The catheters were removed. Hemostasis was attained with the assistance of a 6 French Angio-Seal device. IMPRESSION: 1. Small focus of active arterial extravasation at the hepatic flexure. 2. Successful coil embolization of an accessory branch of the middle colic artery. Signed, Criselda Peaches, MD, [place] [facility] Radiology Electronically Signed   By: Jacqulynn Cadet M.D.   On: 09/01/2019 12:31   IR EMBO ART  VEN HEMORR LYMPH EXTRAV  INC GUIDE ROADMAPPING  Result Date: 09/01/2019 INDICATION: 69 year old female with acute active lower GI bleed. CT arteriogram localizes the bleeding to the hepatic flexure of the colon. EXAM: IR ULTRASOUND GUIDANCE VASC ACCESS RIGHT; ADDITIONAL ARTERIOGRAPHY; SELECTIVE VISCERAL ARTERIOGRAPHY; IR EMBO ART VEN HEMORR LYMPH EXTRAV INC GUIDE ROADMAPPING 1. Ultrasound-guided vascular access right common femoral artery 2. Celiac catheterization with arteriogram 3. Superior mesenteric artery catheterization with arteriogram 4. Right colic artery with arteriogram 5. Accessory middle colic artery with arteriogram 6. Distal branch of accessory middle colic artery with arteriogram 7. Additional distal branch of accessory middle colic artery with arteriogram 8. Coil embolization MEDICATIONS: None ANESTHESIA/SEDATION: None. CONTRAST:  62mL OMNIPAQUE IOHEXOL 300 MG/ML  SOLN FLUOROSCOPY TIME:  Fluoroscopy Time: 8 minutes 0 seconds (464 mGy). COMPLICATIONS: None immediate. PROCEDURE: Informed consent was obtained from the patient following  explanation of the procedure, risks, benefits and alternatives. The patient understands, agrees and consents for the procedure. All questions were addressed. A time out was performed prior to the initiation of the procedure. Maximal barrier sterile technique utilized including caps, mask, sterile gowns, sterile gloves, large sterile drape, hand hygiene, and Betadine prep. The was interrogated with ultrasound and found to be widely patent. An image was obtained and stored for the medical record. Local anesthesia was attained by infiltration with 1% lidocaine. A small dermatotomy was made. Under real-time sonographic guidance, the vessel was punctured with a 21 gauge micropuncture needle. Using standard technique, the initial micro needle was exchanged over a 0.018 micro wire for a transitional 4 [place] micro sheath. The micro sheath was then exchanged over a 0.035 wire for a 5 French vascular sheath. A C2 cobra catheter was advanced over a Bentson wire into the abdominal aorta. The celiac axis was catheterized. Arteriography was performed. Conventional celiac arterial anatomy. No evidence of replaced middle colic artery. No evidence of active hemorrhage. The catheter was next advanced into the superior mesenteric artery. A superior mesenteric arteriogram was  performed. There appears to be a faint focus of contrast extravasation in the region of the hepatic flexure. This appears to represent a watershed zone between the main and an accessory middle colic artery. A renegade ST microcatheter was advanced over a Fathom 16 wire. The first artery selected was the right colic artery. Arteriography was performed. No evidence of active hemorrhage. The artery was then used to select the accessory middle colic artery. Arteriography was performed. There is a small focus of active hemorrhage in the hepatic flexure. The active hemorrhage appears to be arising from a region of numerous small thread like arteries. In an effort to  perform a more selective embolization, the microcatheter was advanced into a more inferior distal branch of the accessory middle colic artery. Arteriography was performed. There is persistent mild hemorrhage. However, there is significant cross flow between the arterial branches. Coil embolization was initiated. This distal accessory branch was successfully coil embolized. The catheter was then advanced into the more superior distal branch of the accessory middle colic artery. There is persistent contrast extravasation. Therefore, coil embolization was performed again. The coil pack was carried back into the main trunk of the accessory middle colic artery. Follow-up arteriography confirms complete stasis. While there is likely some collateral flow from the right were branch of the main middle colic artery, the decision was made not to pursue further embolization for fear of result in ischemia. This embolization should significantly decrease the pressure head to the site of bleeding facilitating healing. The catheters were removed. Hemostasis was attained with the assistance of a 6 French Angio-Seal device. IMPRESSION: 1. Small focus of active arterial extravasation at the hepatic flexure. 2. Successful coil embolization of an accessory branch of the middle colic artery. Signed, Criselda Peaches, MD, [place] [facility] Radiology Electronically Signed   By: Jacqulynn Cadet M.D.   On: 09/01/2019 12:31    Assessment / Plan:  89. 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 CTA 3/25 showed active bleeding at hepatic flexure. IR consulted, she is s/p arteriogram and embolization 3/25.  She had more rectal  bleeding on 3/26 for which she received a 4th unit of packed red blood cells.  She  continues to pass small amounts of blood from the rectum as confirmed by the nursing staff today. If she continues to bleed she may require a partial colectomy. -Follow H&H closely -Monitor for further active GI bleeding -Soft diet as tolerated  2. 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 She received a total of 4 units or PRBCs since her admission, last transfusion was on 3/26.  Hemoglobin 8.6 up from 7.8.  -CBC in a.m.  3.  Coagulopathy -Improved with Vitamin K ( 2.1 >>> 1.4) -PT/INR in a.m.  4.  ESRD on HD.  Creatinine 2.70. Last dialysis session 3/25.  Roberta Bryant  09/02/2019, 12:45 PM   GI ATTENDING  Interval data and history reviewed. Patient personally seen and examined. Agree with interval progress note. Continues with slow bleeding. Recommend ongoing supportive care at this point, as she remains quite stable. Discussed with patient and her son (in room).  Roberta Chuck. Geri Seminole., M.D. [facility] Division of Gastroenterology

## 2019-09-02 NOTE — Progress Notes (Signed)
PROGRESS NOTE    Roberta Bryant  MVE:[id number] DOB: 11-03-1950 DOA: 09/04/2019 PCP: [name], MD   Brief Narrative: 69 YOF ESRD on HD, hypertension, CAD, diabetes, hypothyroidism presented with acute blood loss anemia in the setting of GI bleed, was seen by GI, nephrology.  Underwent EGD 3/22 nondiagnostic and colonoscopy 3/23-that showed left colon diverticulosis no stigmata of bleeding.Continue to have rectal bleeding underwent CTA 3/25 with active bleeding in the hepatic flexure and had embolization done.  Patient continues to have intermittent rectal bleeding, needing blood transfusion.  Subjective: Seen this morning nursing changing her alignment and has rectal bleed small-mod size, patient not had moderate size of rectal bleed hemoglobin dropped to 6.6 from an 1 more unit of PRBC was transfused and hemoglobin this morning 7.8 g.    Assessment & Plan:  Recurrent Diverticular GI bleeding: s/p EGD 08/2008 colonoscopy 3/23-with mild nonbleeding erosive gastropathy, peptic duodenitis, 6 polyps removed from right colon and left colon and had left colon diverticulosis no stigmata of bleeding. Pathology shows tubular adenomas -again hypotensive and anemic with rectal bleeding 3/25 underwent CTA that showed active bleeding at hepatic flexure, and is status post arteriogram and embolization 3/25.  Again noticed bleeding last night and this morning received additional 1 unit PRBC.  I notified GI Dr. Henrene Pastor call Dr. Kathlene Cote from IR who reviewed patient's CTA advises surgical consultation given the extensive arteriogram and embolization she had done this 2 days ago on 25th, he feels higher ambulation will not be helpful-could cause ischemic bowel at this point.  I have consulted surgery and discussed with Claiborne Billings  monitor H&H 4-hour and provide supportive care.    Acute blood loss anemia in the setting of anemia of chronic kidney disease: Hemoglobin as low as 5.4 g-s/p 1 PRBC and increased to 8.6  g, 3/24 am hb 6.1 g, s/p 1 unit PRBC in dialysis 3/24-because of rectal bleeding and hypotension received 1 unit PRBC 3/25 and again 1 unit 3/27 am.  Continue Aranesp, no iron deficiency per study 3/22.  Check H&H every 4 hour and transfuse as needed. Recent Labs  Lab 09/01/19 0158 09/01/19 0804 09/01/19 1500 09/01/19 2026 09/02/19 0444  HGB 8.2* 8.7* 7.3* 6.6* 7.8*  HCT 25.8* 26.8* 22.9* 20.4* 24.5*   Bleeding from dialysis fistula continue AVF routine care  ESRD on HD MWF -last HD 3/26.Appreciate nephrology input.  Denies shortness of breath.  LUE aVF pulsatile with LUE larger than RUE ?CV Stenosis- fistulogram once stable per nephrology.    Thrombocytopenia: stable, monitor Recent Labs  Lab 09/01/19 0158 09/01/19 0804 09/01/19 1500 09/01/19 2026 09/02/19 0444  PLT 141*  135* 163 159 114* 124*   Elevated INR 2.0 on admission, improved to 1.4.  Continue daily vitamin K. Recent Labs  Lab 08/10/2019 1247 08/16/2019 1206 08/30/19 0418 09/01/19 0158  INR 2.1* 1.5* 1.5* 1.4*   Hypertension: Needed IV fluid boluses and blood transfusion 3/25.  Blood pressure stable today.  Monitor.   Metabolic bone disease continue calcitriol.  Anxiety/depression: Mood is stable resume home Klonopin as tolerated  CAD: No chest pain.  Monitor.  Home meds aspirin Coreg on hold in the setting of GI bleed, soft blood pressure  Diabetes mellitus: Hemoglobin A1c 4.9.  Continue sliding scale insulin.  Hold her home Januvia. Recent Labs  Lab 09/01/19 1223 09/01/19 1849 09/01/19 2149 09/02/19 0444 09/02/19 0808  GLUCAP 91 87 88 75 85   Hypothyroidism: Continue home Synthroid  Deconditioning: Continue PT OT likely need a  skilled nursing facility placement as suggested by PT.   Nutrition: Diet Order            Diet Carb Modified Fluid consistency: Thin; Room service appropriate? Yes  Diet effective now              Nutrition Problem: Inadequate oral intake Etiology: altered GI  function Signs/Symptoms: meal completion < 25% Interventions: MVI, Prostat, Nepro shake Body mass index is 38.61 kg/m.   DVT prophylaxis:SCD-holding chemical prophylaxis due to anemia Code Status:FULL Family Communication: plan of care discussed with patient at bedside. Son at bedside was updated. Disposition Plan: Patient is from:home Anticipated Disposition: to  SNF Barriers to discharge or conditions that needs to be met prior to discharge: Patient admitted with anemia 4.9 underwent EGD and colonoscopy, with ongoing rectal bleeding underwent IR embolization of the active bleeding and hepatic flexure, continues to have ongoing intermittent bleeding surgery consulted GI following nephrology following.  Currently unstable for discharge planning on a skilled nursing facility once stable.    Consultants: General surgery, nephrology, gastroenterology, interventional radiology.   Procedures: 08/22/2019 EGD: A few gastric polyps biopsied: hyperplastic polyp with eroded granulation tissue and surface reactive change.  Mild, nonbleeding, erosive gastropathy biopsied: Mild reactive gastropathy with some surface hyperplastic change, no H. Pylori.  Duodenal nodule biopsied: peptic duodenitis.  Protonix 40/day in place, Prilosec 20/daily, 81 ASA/daily PTA.  .   08/10/2019 colonoscopy.  6 polyps removed located in right and left colon.  Left colon diverticulosis.  Solitary rectal ulcer, no stigmata of bleeding. Pathology shows tubular adenomas, no HGD.  Ulcer not biopsied.   3/25 IR arteriogram with bleeding in the hepatic flexure and s/p embolization  Microbiology:see note  Medications: Scheduled Meds: . calcitRIOL  1 mcg Oral Q M,W,F-HD  . Chlorhexidine Gluconate Cloth  6 each Topical Q0600  . darbepoetin (ARANESP) injection - DIALYSIS  100 mcg Intravenous Q Wed-HD  . feeding supplement (NEPRO CARB STEADY)  237 mL Oral BID BM  . feeding supplement (PRO-STAT SUGAR FREE 64)  30 mL Oral BID  .  insulin aspart  0-6 Units Subcutaneous TID WC  . levothyroxine  50 mcg Oral QAC breakfast  . multivitamin  1 tablet Oral QHS  . pantoprazole  40 mg Oral Q0600  . phytonadione  10 mg Oral Daily   Continuous Infusions: . sodium chloride    . sodium chloride      Antimicrobials: Anti-infectives (From admission, onward)   None       Objective: Vitals: Today's Vitals   09/02/19 0223 09/02/19 0456 09/02/19 0722 09/02/19 0843  BP:  115/63    Pulse:  99    Resp:  20    Temp:  99.3 F (37.4 C)    TempSrc:  Oral    SpO2:  100%    Weight:      Height:      PainSc: Asleep  Asleep Asleep    Intake/Output Summary (Last 24 hours) at 09/02/2019 1035 Last data filed at 09/02/2019 0121 Gross per 24 hour  Intake 940.67 ml  Output 13 ml  Net 927.67 ml   Filed Weights   08/31/19 0500 09/01/19 1441 09/01/19 1805  Weight: 85.2 kg 83.5 kg 83.8 kg   Weight change:    Intake/Output from previous day: 03/26 0701 - 03/27 0700 In: 1060.7 [P.O.:240; I.V.:50; Blood:770.7] Out: 13 [Stool:2] Intake/Output this shift: No intake/output data recorded.  Examination:  General exam: Alert awake, anxious, morbidly obese.   HEENT:Oral mucosa moist, Ear/Nose  WNL grossly,dentition normal. Respiratory system: bilaterally clear, non tender. Cardiovascular system: S1 & S2 +, regular, No JVD. Gastrointestinal system: Abdomen soft, NT,ND, BS+. Nervous System:Alert, awake, moving extremities and grossly nonfocal Extremities: No edema, distal peripheral pulses palpable.  Skin: No rashes,no icterus. MSK: Normal muscle bulk,tone, power  Data Reviewed: I have personally reviewed following labs and imaging studies CBC: Recent Labs  Lab 08/13/2019 1247 08/16/2019 1321 09/01/19 0158 09/01/19 0804 09/01/19 1500 09/01/19 2026 09/02/19 0444  WBC 5.2   < > 4.5 5.5 5.7 5.6 6.3  NEUTROABS 3.4  --   --   --   --   --   --   HGB 4.9*   < > 8.2* 8.7* 7.3* 6.6* 7.8*  HCT 16.0*   < > 25.8* 26.8* 22.9* 20.4*  24.5*  MCV 127.0*   < > 98.1 97.8 97.9 99.5 94.6  PLT 151   < > 141*  135* 163 159 114* 124*   < > = values in this interval not displayed.   Basic Metabolic Panel: Recent Labs  Lab 08/17/2019 1608 08/30/19 0418 08/31/19 0141 09/01/19 0158 09/02/19 0444  NA 136 138 136 136 138  K 4.1 3.7 3.9 4.5 3.5  CL 100 103 100 101 103  CO2 '25 23 24 23 24  '$ GLUCOSE 86 105* 114* 85 88  BUN 12 14 7* 12 <5*  CREATININE 3.33* 4.53* 2.88* 3.77* 2.70*  CALCIUM 6.7* 7.0* 7.1* 7.0* 7.2*  MG  --  1.6*  --   --   --   PHOS 1.8* 1.9*  --   --   --    GFR: Estimated Creatinine Clearance: 18 mL/min (A) (by C-G formula based on SCr of 2.7 mg/dL (H)). Liver Function Tests: Recent Labs  Lab 08/20/2019 1247 08/08/2019 1608 08/30/19 0418  AST 36  --   --   ALT 17  --   --   ALKPHOS 50  --   --   BILITOT 1.4*  --   --   PROT 4.3*  --   --   ALBUMIN 1.5* 1.6* 1.6*   No results for input(s): LIPASE, AMYLASE in the last 168 hours. No results for input(s): AMMONIA in the last 168 hours. Coagulation Profile: Recent Labs  Lab 09/03/2019 1247 08/08/2019 1206 08/30/19 0418 09/01/19 0158  INR 2.1* 1.5* 1.5* 1.4*   Cardiac Enzymes: No results for input(s): CKTOTAL, CKMB, CKMBINDEX, TROPONINI in the last 168 hours. BNP (last 3 results) No results for input(s): PROBNP in the last 8760 hours. HbA1C: No results for input(s): HGBA1C in the last 72 hours. CBG: Recent Labs  Lab 09/01/19 1223 09/01/19 1849 09/01/19 2149 09/02/19 0444 09/02/19 0808  GLUCAP 91 87 88 75 85   Lipid Profile: No results for input(s): CHOL, HDL, LDLCALC, TRIG, CHOLHDL, LDLDIRECT in the last 72 hours. Thyroid Function Tests: No results for input(s): TSH, T4TOTAL, FREET4, T3FREE, THYROIDAB in the last 72 hours. Anemia Panel: No results for input(s): VITAMINB12, FOLATE, FERRITIN, TIBC, IRON, RETICCTPCT in the last 72 hours. Sepsis Labs: Recent Labs  Lab 08/26/2019 1247  LATICACIDVEN 1.8    Recent Results (from the past 240  hour(s))  Respiratory Panel by RT PCR (Flu A&B, Covid) - Nasopharyngeal Swab     Status: None   Collection Time: 08/15/2019  3:17 PM   Specimen: Nasopharyngeal Swab  Result Value Ref Range Status   SARS Coronavirus 2 by RT PCR NEGATIVE NEGATIVE Final    Comment: (NOTE) SARS-CoV-2 target nucleic acids are  NOT DETECTED. The SARS-CoV-2 RNA is generally detectable in upper respiratoy specimens during the acute phase of infection. The lowest concentration of SARS-CoV-2 viral copies this assay can detect is 131 copies/mL. A negative result does not preclude SARS-Cov-2 infection and should not be used as the sole basis for treatment or other patient management decisions. A negative result may occur with  improper specimen collection/handling, submission of specimen other than nasopharyngeal swab, presence of viral mutation(s) within the areas targeted by this assay, and inadequate number of viral copies (<131 copies/mL). A negative result must be combined with clinical observations, patient history, and epidemiological information. The expected result is Negative. Fact Sheet for Patients:  PinkCheek.be Fact Sheet for Healthcare Providers:  GravelBags.it This test is not yet ap proved or cleared by the [place] FDA and  has been authorized for detection and/or diagnosis of SARS-CoV-2 by FDA under an Emergency Use Authorization (EUA). This EUA will remain  in effect (meaning this test can be used) for the duration of the COVID-19 declaration under Section 564(b)(1) of the Act, 21 U.S.C. section 360bbb-3(b)(1), unless the authorization is terminated or revoked sooner.    Influenza A by PCR NEGATIVE NEGATIVE Final   Influenza B by PCR NEGATIVE NEGATIVE Final    Comment: (NOTE) The Xpert Xpress SARS-CoV-2/FLU/RSV assay is intended as an aid in  the diagnosis of influenza from Nasopharyngeal swab specimens and  should not be used as  a sole basis for treatment. Nasal washings and  aspirates are unacceptable for Xpert Xpress SARS-CoV-2/FLU/RSV  testing. Fact Sheet for Patients: PinkCheek.be Fact Sheet for Healthcare Providers: GravelBags.it This test is not yet approved or cleared by the [place] FDA and  has been authorized for detection and/or diagnosis of SARS-CoV-2 by  FDA under an Emergency Use Authorization (EUA). This EUA will remain  in effect (meaning this test can be used) for the duration of the  Covid-19 declaration under Section 564(b)(1) of the Act, 21  U.S.C. section 360bbb-3(b)(1), unless the authorization is  terminated or revoked. Performed at [facility], [place] [address], [place], [place] [address]   MRSA PCR Screening     Status: None   Collection Time: 09/01/2019  9:21 PM   Specimen: Nasopharyngeal  Result Value Ref Range Status   MRSA by PCR NEGATIVE NEGATIVE Final    Comment:        The GeneXpert MRSA Assay (FDA approved for NASAL specimens only), is one component of a comprehensive MRSA colonization surveillance program. It is not intended to diagnose MRSA infection nor to guide or monitor treatment for MRSA infections. Performed at [facility], [place] [address] [place], [place] [address]       Radiology Studies: CT ANGIO PELVIS W OR WO CONTRAST  Result Date: 08/31/2019 CLINICAL DATA:  GI bleeding, recurrent dark burgundy hematochezia this morning, post EGD and colonoscopy, diverticular bleed EXAM: CT ANGIOGRAPHY CHEST, ABDOMEN AND PELVIS TECHNIQUE: Multidetector CT imaging through the chest, abdomen and pelvis was performed using the standard protocol during bolus administration of intravenous contrast. Multiplanar reconstructed images and MIPs were obtained and reviewed to evaluate the vascular anatomy. CONTRAST:  155m OMNIPAQUE IOHEXOL 350 MG/ML SOLN IV COMPARISON:  CT abdomen and pelvis 04/05/2013 FINDINGS:  CTA CHEST FINDINGS Cardiovascular: Atherosclerotic calcifications aorta, proximal great vessels, and coronary arteries. Aorta normal caliber. No aortic aneurysm or dissection. Pulmonary arteries patent on non targeted exam. No pericardial effusion. Mediastinum/Nodes: Esophagus unremarkable.  No thoracic adenopathy. Lungs/Pleura: Lung apices not imaged  by this protocol. Small RIGHT pleural effusion and compressive atelectasis of the RIGHT lower lobe. Question minimal patchy infiltrate RIGHT lung. No pneumothorax or definite mass/nodule. Musculoskeletal: RIGHT shoulder prosthesis. Osseous demineralization. No acute osseous findings. Review of the MIP images confirms the above findings. CTA ABDOMEN AND PELVIS FINDINGS VASCULAR Aorta: Normal caliber without aneurysm or dissection. Scattered atherosclerotic calcifications of abdominal aorta. Celiac: Mild plaque at origin of celiac artery. Minimal narrowing, less than 50%. No definite stenosis or thrombus. SMA: Plaque at SMA origin with slightly greater than 50% stenosis. Additional calcified plaque along the course of the SMA. Renals: Marked plaque at the origins of the renal arteries bilaterally. Renal arteries are diminutive, patient with end-stage renal disease. IMA: Patent though originates at significant plaque with high-grade stenosis of origin Inflow: Calcified plaque and minimal thrombus in the common iliac arteries bilaterally. External iliac arteries patent. Minimal plaque in the internal iliac arteries. Normal caliber iliac vessels. Veins: IVC, iliac veins, common femoral veins, portal vein, SMV and splenic vein appear patent. Review of the MIP images confirms the above findings. NON-VASCULAR Hepatobiliary: Post cholecystectomy.  Liver unremarkable. Pancreas: Atrophic pancreas without mass Spleen: Normal appearance Adrenals/Urinary Tract: Adrenal glands normal appearance. Markedly atrophic kidneys consistent with end-stage renal disease. Stomach/Bowel: Linear  high attenuation focus within the stomach 10 mm diameter on precontrast image 21, question biopsy clip versus ingested foreign body, persists throughout exam. Question polyps versus redundant mucosa along the lesser curvature, recommend correlation with prior EGD. Stomach otherwise unremarkable. Duodenal bulb and sweep normal appearance. Dilated proximal small bowel loops with segment of small bowel wall thickening in the RIGHT mid abdomen question enteritis. Distal small loops beyond this site are decompressed. Cecum and ascending colon normal appearance. Appendix not visualized. Bowel wall thickening of the hepatic flexure of the colon. High attenuation is seen within the lumen of the hepatic flexure on arterial phase (series 13, image 73) not seen on precontrast imaging and increasing on delayed images (series 19, image 45) consistent with a focus of active GI bleeding. Remainder of colon unremarkable. No additional colonic wall thickening or additional sites of active bleeding. Lymphatic: No adenopathy Reproductive: Atrophic uterus with unremarkable adnexa Other: No free air. Small amount of perihepatic free fluid. Small periumbilical hernia containing fat. Musculoskeletal: Osseous demineralization. Review of the MIP images confirms the above findings. IMPRESSION: Extensive atherosclerotic disease changes without evidence of aortic aneurysm or dissection. Plaque at the origins of the celiac and superior mesenteric arteries with less than 50% narrowing at celiac origin and greater than 50% narrowing at SMA origin. Focus of active GI bleeding at hepatic flexure of colon, which demonstrates wall thickening which could be the result of infection, ischemia, or inflammatory bowel disease. Additional thickened small bowel loop in the LEFT mid abdomen question enteritis, associated with dilatation of small bowel loops proximal to this site suggesting a component of obstruction. Question gastric polyps versus redundant  mucosa; recommend correlation with prior endoscopy. Small RIGHT pleural effusion and RIGHT basilar atelectasis. Small amount of nonspecific perihepatic free fluid. Findings called to Dr. Lennox Grumbles on 08/31/2019 at 2048 hrs. Electronically Signed   By: Lavonia Dana M.D.   On: 08/31/2019 20:50   CT ANGIO ABDOMEN W &/OR WO CONTRAST  Result Date: 08/31/2019 CLINICAL DATA:  GI bleeding, recurrent dark burgundy hematochezia this morning, post EGD and colonoscopy, diverticular bleed EXAM: CT ANGIOGRAPHY CHEST, ABDOMEN AND PELVIS TECHNIQUE: Multidetector CT imaging through the chest, abdomen and pelvis was performed using the standard protocol during bolus administration  of intravenous contrast. Multiplanar reconstructed images and MIPs were obtained and reviewed to evaluate the vascular anatomy. CONTRAST:  169m OMNIPAQUE IOHEXOL 350 MG/ML SOLN IV COMPARISON:  CT abdomen and pelvis 04/05/2013 FINDINGS: CTA CHEST FINDINGS Cardiovascular: Atherosclerotic calcifications aorta, proximal great vessels, and coronary arteries. Aorta normal caliber. No aortic aneurysm or dissection. Pulmonary arteries patent on non targeted exam. No pericardial effusion. Mediastinum/Nodes: Esophagus unremarkable.  No thoracic adenopathy. Lungs/Pleura: Lung apices not imaged by this protocol. Small RIGHT pleural effusion and compressive atelectasis of the RIGHT lower lobe. Question minimal patchy infiltrate RIGHT lung. No pneumothorax or definite mass/nodule. Musculoskeletal: RIGHT shoulder prosthesis. Osseous demineralization. No acute osseous findings. Review of the MIP images confirms the above findings. CTA ABDOMEN AND PELVIS FINDINGS VASCULAR Aorta: Normal caliber without aneurysm or dissection. Scattered atherosclerotic calcifications of abdominal aorta. Celiac: Mild plaque at origin of celiac artery. Minimal narrowing, less than 50%. No definite stenosis or thrombus. SMA: Plaque at SMA origin with slightly greater than 50% stenosis.  Additional calcified plaque along the course of the SMA. Renals: Marked plaque at the origins of the renal arteries bilaterally. Renal arteries are diminutive, patient with end-stage renal disease. IMA: Patent though originates at significant plaque with high-grade stenosis of origin Inflow: Calcified plaque and minimal thrombus in the common iliac arteries bilaterally. External iliac arteries patent. Minimal plaque in the internal iliac arteries. Normal caliber iliac vessels. Veins: IVC, iliac veins, common femoral veins, portal vein, SMV and splenic vein appear patent. Review of the MIP images confirms the above findings. NON-VASCULAR Hepatobiliary: Post cholecystectomy.  Liver unremarkable. Pancreas: Atrophic pancreas without mass Spleen: Normal appearance Adrenals/Urinary Tract: Adrenal glands normal appearance. Markedly atrophic kidneys consistent with end-stage renal disease. Stomach/Bowel: Linear high attenuation focus within the stomach 10 mm diameter on precontrast image 21, question biopsy clip versus ingested foreign body, persists throughout exam. Question polyps versus redundant mucosa along the lesser curvature, recommend correlation with prior EGD. Stomach otherwise unremarkable. Duodenal bulb and sweep normal appearance. Dilated proximal small bowel loops with segment of small bowel wall thickening in the RIGHT mid abdomen question enteritis. Distal small loops beyond this site are decompressed. Cecum and ascending colon normal appearance. Appendix not visualized. Bowel wall thickening of the hepatic flexure of the colon. High attenuation is seen within the lumen of the hepatic flexure on arterial phase (series 13, image 73) not seen on precontrast imaging and increasing on delayed images (series 19, image 45) consistent with a focus of active GI bleeding. Remainder of colon unremarkable. No additional colonic wall thickening or additional sites of active bleeding. Lymphatic: No adenopathy  Reproductive: Atrophic uterus with unremarkable adnexa Other: No free air. Small amount of perihepatic free fluid. Small periumbilical hernia containing fat. Musculoskeletal: Osseous demineralization. Review of the MIP images confirms the above findings. IMPRESSION: Extensive atherosclerotic disease changes without evidence of aortic aneurysm or dissection. Plaque at the origins of the celiac and superior mesenteric arteries with less than 50% narrowing at celiac origin and greater than 50% narrowing at SMA origin. Focus of active GI bleeding at hepatic flexure of colon, which demonstrates wall thickening which could be the result of infection, ischemia, or inflammatory bowel disease. Additional thickened small bowel loop in the LEFT mid abdomen question enteritis, associated with dilatation of small bowel loops proximal to this site suggesting a component of obstruction. Question gastric polyps versus redundant mucosa; recommend correlation with prior endoscopy. Small RIGHT pleural effusion and RIGHT basilar atelectasis. Small amount of nonspecific perihepatic free fluid. Findings called to  Dr. Lennox Grumbles on 08/31/2019 at 2048 hrs. Electronically Signed   By: Lavonia Dana M.D.   On: 08/31/2019 20:50   IR Angiogram Visceral Selective  Result Date: 09/01/2019 INDICATION: 69 year old female with acute active lower GI bleed. CT arteriogram localizes the bleeding to the hepatic flexure of the colon. EXAM: IR ULTRASOUND GUIDANCE VASC ACCESS RIGHT; ADDITIONAL ARTERIOGRAPHY; SELECTIVE VISCERAL ARTERIOGRAPHY; IR EMBO ART VEN HEMORR LYMPH EXTRAV INC GUIDE ROADMAPPING 1. Ultrasound-guided vascular access right common femoral artery 2. Celiac catheterization with arteriogram 3. Superior mesenteric artery catheterization with arteriogram 4. Right colic artery with arteriogram 5. Accessory middle colic artery with arteriogram 6. Distal branch of accessory middle colic artery with arteriogram 7. Additional distal branch of  accessory middle colic artery with arteriogram 8. Coil embolization MEDICATIONS: None ANESTHESIA/SEDATION: None. CONTRAST:  16m OMNIPAQUE IOHEXOL 300 MG/ML  SOLN FLUOROSCOPY TIME:  Fluoroscopy Time: 8 minutes 0 seconds (464 mGy). COMPLICATIONS: None immediate. PROCEDURE: Informed consent was obtained from the patient following explanation of the procedure, risks, benefits and alternatives. The patient understands, agrees and consents for the procedure. All questions were addressed. A time out was performed prior to the initiation of the procedure. Maximal barrier sterile technique utilized including caps, mask, sterile gowns, sterile gloves, large sterile drape, hand hygiene, and Betadine prep. The was interrogated with ultrasound and found to be widely patent. An image was obtained and stored for the medical record. Local anesthesia was attained by infiltration with 1% lidocaine. A small dermatotomy was made. Under real-time sonographic guidance, the vessel was punctured with a 21 gauge micropuncture needle. Using standard technique, the initial micro needle was exchanged over a 0.018 micro wire for a transitional 4 [place] sheath. The micro sheath was then exchanged over a 0.035 wire for a 5 French vascular sheath. A C2 cobra catheter was advanced over a Bentson wire into the abdominal aorta. The celiac axis was catheterized. Arteriography was performed. Conventional celiac arterial anatomy. No evidence of replaced middle colic artery. No evidence of active hemorrhage. The catheter was next advanced into the superior mesenteric artery. A superior mesenteric arteriogram was performed. There appears to be a faint focus of contrast extravasation in the region of the hepatic flexure. This appears to represent a watershed zone between the main and an accessory middle colic artery. A renegade ST microcatheter was advanced over a Fathom 16 wire. The first artery selected was the right colic artery. Arteriography  was performed. No evidence of active hemorrhage. The artery was then used to select the accessory middle colic artery. Arteriography was performed. There is a small focus of active hemorrhage in the hepatic flexure. The active hemorrhage appears to be arising from a region of numerous small thread like arteries. In an effort to perform a more selective embolization, the microcatheter was advanced into a more inferior distal branch of the accessory middle colic artery. Arteriography was performed. There is persistent mild hemorrhage. However, there is significant cross flow between the arterial branches. Coil embolization was initiated. This distal accessory branch was successfully coil embolized. The catheter was then advanced into the more superior distal branch of the accessory middle colic artery. There is persistent contrast extravasation. Therefore, coil embolization was performed again. The coil pack was carried back into the main trunk of the accessory middle colic artery. Follow-up arteriography confirms complete stasis. While there is likely some collateral flow from the right were branch of the main middle colic artery, the decision was made not to pursue further embolization for fear of  result in ischemia. This embolization should significantly decrease the pressure head to the site of bleeding facilitating healing. The catheters were removed. Hemostasis was attained with the assistance of a 6 French Angio-Seal device. IMPRESSION: 1. Small focus of active arterial extravasation at the hepatic flexure. 2. Successful coil embolization of an accessory branch of the middle colic artery. Signed, Criselda Peaches, MD, [place] [facility] Radiology Electronically Signed   By: Jacqulynn Cadet M.D.   On: 09/01/2019 12:31   IR Angiogram Selective Each Additional Vessel  Result Date: 09/01/2019 INDICATION: 69 year old female with acute active lower GI bleed. CT  arteriogram localizes the bleeding to the hepatic flexure of the colon. EXAM: IR ULTRASOUND GUIDANCE VASC ACCESS RIGHT; ADDITIONAL ARTERIOGRAPHY; SELECTIVE VISCERAL ARTERIOGRAPHY; IR EMBO ART VEN HEMORR LYMPH EXTRAV INC GUIDE ROADMAPPING 1. Ultrasound-guided vascular access right common femoral artery 2. Celiac catheterization with arteriogram 3. Superior mesenteric artery catheterization with arteriogram 4. Right colic artery with arteriogram 5. Accessory middle colic artery with arteriogram 6. Distal branch of accessory middle colic artery with arteriogram 7. Additional distal branch of accessory middle colic artery with arteriogram 8. Coil embolization MEDICATIONS: None ANESTHESIA/SEDATION: None. CONTRAST:  69m OMNIPAQUE IOHEXOL 300 MG/ML  SOLN FLUOROSCOPY TIME:  Fluoroscopy Time: 8 minutes 0 seconds (464 mGy). COMPLICATIONS: None immediate. PROCEDURE: Informed consent was obtained from the patient following explanation of the procedure, risks, benefits and alternatives. The patient understands, agrees and consents for the procedure. All questions were addressed. A time out was performed prior to the initiation of the procedure. Maximal barrier sterile technique utilized including caps, mask, sterile gowns, sterile gloves, large sterile drape, hand hygiene, and Betadine prep. The was interrogated with ultrasound and found to be widely patent. An image was obtained and stored for the medical record. Local anesthesia was attained by infiltration with 1% lidocaine. A small dermatotomy was made. Under real-time sonographic guidance, the vessel was punctured with a 21 gauge micropuncture needle. Using standard technique, the initial micro needle was exchanged over a 0.018 micro wire for a transitional 4 [place] sheath. The micro sheath was then exchanged over a 0.035 wire for a 5 French vascular sheath. A C2 cobra catheter was advanced over a Bentson wire into the abdominal aorta. The celiac axis was  catheterized. Arteriography was performed. Conventional celiac arterial anatomy. No evidence of replaced middle colic artery. No evidence of active hemorrhage. The catheter was next advanced into the superior mesenteric artery. A superior mesenteric arteriogram was performed. There appears to be a faint focus of contrast extravasation in the region of the hepatic flexure. This appears to represent a watershed zone between the main and an accessory middle colic artery. A renegade ST microcatheter was advanced over a Fathom 16 wire. The first artery selected was the right colic artery. Arteriography was performed. No evidence of active hemorrhage. The artery was then used to select the accessory middle colic artery. Arteriography was performed. There is a small focus of active hemorrhage in the hepatic flexure. The active hemorrhage appears to be arising from a region of numerous small thread like arteries. In an effort to perform a more selective embolization, the microcatheter was advanced into a more inferior distal branch of the accessory middle colic artery. Arteriography was performed. There is persistent mild hemorrhage. However, there is significant cross flow between the arterial branches. Coil embolization was initiated. This distal accessory branch was successfully coil embolized. The catheter was then advanced into the more superior distal branch of  the accessory middle colic artery. There is persistent contrast extravasation. Therefore, coil embolization was performed again. The coil pack was carried back into the main trunk of the accessory middle colic artery. Follow-up arteriography confirms complete stasis. While there is likely some collateral flow from the right were branch of the main middle colic artery, the decision was made not to pursue further embolization for fear of result in ischemia. This embolization should significantly decrease the pressure head to the site of bleeding facilitating  healing. The catheters were removed. Hemostasis was attained with the assistance of a 6 French Angio-Seal device. IMPRESSION: 1. Small focus of active arterial extravasation at the hepatic flexure. 2. Successful coil embolization of an accessory branch of the middle colic artery. Signed, Criselda Peaches, MD, [place] [facility] Radiology Electronically Signed   By: Jacqulynn Cadet M.D.   On: 09/01/2019 12:31   IR Angiogram Selective Each Additional Vessel  Result Date: 09/01/2019 INDICATION: 69 year old female with acute active lower GI bleed. CT arteriogram localizes the bleeding to the hepatic flexure of the colon. EXAM: IR ULTRASOUND GUIDANCE VASC ACCESS RIGHT; ADDITIONAL ARTERIOGRAPHY; SELECTIVE VISCERAL ARTERIOGRAPHY; IR EMBO ART VEN HEMORR LYMPH EXTRAV INC GUIDE ROADMAPPING 1. Ultrasound-guided vascular access right common femoral artery 2. Celiac catheterization with arteriogram 3. Superior mesenteric artery catheterization with arteriogram 4. Right colic artery with arteriogram 5. Accessory middle colic artery with arteriogram 6. Distal branch of accessory middle colic artery with arteriogram 7. Additional distal branch of accessory middle colic artery with arteriogram 8. Coil embolization MEDICATIONS: None ANESTHESIA/SEDATION: None. CONTRAST:  61m OMNIPAQUE IOHEXOL 300 MG/ML  SOLN FLUOROSCOPY TIME:  Fluoroscopy Time: 8 minutes 0 seconds (464 mGy). COMPLICATIONS: None immediate. PROCEDURE: Informed consent was obtained from the patient following explanation of the procedure, risks, benefits and alternatives. The patient understands, agrees and consents for the procedure. All questions were addressed. A time out was performed prior to the initiation of the procedure. Maximal barrier sterile technique utilized including caps, mask, sterile gowns, sterile gloves, large sterile drape, hand hygiene, and Betadine prep. The was interrogated with ultrasound  and found to be widely patent. An image was obtained and stored for the medical record. Local anesthesia was attained by infiltration with 1% lidocaine. A small dermatotomy was made. Under real-time sonographic guidance, the vessel was punctured with a 21 gauge micropuncture needle. Using standard technique, the initial micro needle was exchanged over a 0.018 micro wire for a transitional 4 [place] sheath. The micro sheath was then exchanged over a 0.035 wire for a 5 French vascular sheath. A C2 cobra catheter was advanced over a Bentson wire into the abdominal aorta. The celiac axis was catheterized. Arteriography was performed. Conventional celiac arterial anatomy. No evidence of replaced middle colic artery. No evidence of active hemorrhage. The catheter was next advanced into the superior mesenteric artery. A superior mesenteric arteriogram was performed. There appears to be a faint focus of contrast extravasation in the region of the hepatic flexure. This appears to represent a watershed zone between the main and an accessory middle colic artery. A renegade ST microcatheter was advanced over a Fathom 16 wire. The first artery selected was the right colic artery. Arteriography was performed. No evidence of active hemorrhage. The artery was then used to select the accessory middle colic artery. Arteriography was performed. There is a small focus of active hemorrhage in the hepatic flexure. The active hemorrhage appears to be arising from a region of numerous small thread like arteries.  In an effort to perform a more selective embolization, the microcatheter was advanced into a more inferior distal branch of the accessory middle colic artery. Arteriography was performed. There is persistent mild hemorrhage. However, there is significant cross flow between the arterial branches. Coil embolization was initiated. This distal accessory branch was successfully coil embolized. The catheter was then advanced into  the more superior distal branch of the accessory middle colic artery. There is persistent contrast extravasation. Therefore, coil embolization was performed again. The coil pack was carried back into the main trunk of the accessory middle colic artery. Follow-up arteriography confirms complete stasis. While there is likely some collateral flow from the right were branch of the main middle colic artery, the decision was made not to pursue further embolization for fear of result in ischemia. This embolization should significantly decrease the pressure head to the site of bleeding facilitating healing. The catheters were removed. Hemostasis was attained with the assistance of a 6 French Angio-Seal device. IMPRESSION: 1. Small focus of active arterial extravasation at the hepatic flexure. 2. Successful coil embolization of an accessory branch of the middle colic artery. Signed, Criselda Peaches, MD, [place] [facility] Radiology Electronically Signed   By: Jacqulynn Cadet M.D.   On: 09/01/2019 12:31   IR US Guide Vasc Access Right  Result Date: 09/01/2019 INDICATION: 69 year old female with acute active lower GI bleed. CT arteriogram localizes the bleeding to the hepatic flexure of the colon. EXAM: IR ULTRASOUND GUIDANCE VASC ACCESS RIGHT; ADDITIONAL ARTERIOGRAPHY; SELECTIVE VISCERAL ARTERIOGRAPHY; IR EMBO ART VEN HEMORR LYMPH EXTRAV INC GUIDE ROADMAPPING 1. Ultrasound-guided vascular access right common femoral artery 2. Celiac catheterization with arteriogram 3. Superior mesenteric artery catheterization with arteriogram 4. Right colic artery with arteriogram 5. Accessory middle colic artery with arteriogram 6. Distal branch of accessory middle colic artery with arteriogram 7. Additional distal branch of accessory middle colic artery with arteriogram 8. Coil embolization MEDICATIONS: None ANESTHESIA/SEDATION: None. CONTRAST:  2m OMNIPAQUE IOHEXOL 300 MG/ML  SOLN  FLUOROSCOPY TIME:  Fluoroscopy Time: 8 minutes 0 seconds (464 mGy). COMPLICATIONS: None immediate. PROCEDURE: Informed consent was obtained from the patient following explanation of the procedure, risks, benefits and alternatives. The patient understands, agrees and consents for the procedure. All questions were addressed. A time out was performed prior to the initiation of the procedure. Maximal barrier sterile technique utilized including caps, mask, sterile gowns, sterile gloves, large sterile drape, hand hygiene, and Betadine prep. The was interrogated with ultrasound and found to be widely patent. An image was obtained and stored for the medical record. Local anesthesia was attained by infiltration with 1% lidocaine. A small dermatotomy was made. Under real-time sonographic guidance, the vessel was punctured with a 21 gauge micropuncture needle. Using standard technique, the initial micro needle was exchanged over a 0.018 micro wire for a transitional 4 [place] sheath. The micro sheath was then exchanged over a 0.035 wire for a 5 French vascular sheath. A C2 cobra catheter was advanced over a Bentson wire into the abdominal aorta. The celiac axis was catheterized. Arteriography was performed. Conventional celiac arterial anatomy. No evidence of replaced middle colic artery. No evidence of active hemorrhage. The catheter was next advanced into the superior mesenteric artery. A superior mesenteric arteriogram was performed. There appears to be a faint focus of contrast extravasation in the region of the hepatic flexure. This appears to represent a watershed zone between the main and an accessory middle colic artery. A renegade ST microcatheter  was advanced over a Fathom 16 wire. The first artery selected was the right colic artery. Arteriography was performed. No evidence of active hemorrhage. The artery was then used to select the accessory middle colic artery. Arteriography was performed. There is a small  focus of active hemorrhage in the hepatic flexure. The active hemorrhage appears to be arising from a region of numerous small thread like arteries. In an effort to perform a more selective embolization, the microcatheter was advanced into a more inferior distal branch of the accessory middle colic artery. Arteriography was performed. There is persistent mild hemorrhage. However, there is significant cross flow between the arterial branches. Coil embolization was initiated. This distal accessory branch was successfully coil embolized. The catheter was then advanced into the more superior distal branch of the accessory middle colic artery. There is persistent contrast extravasation. Therefore, coil embolization was performed again. The coil pack was carried back into the main trunk of the accessory middle colic artery. Follow-up arteriography confirms complete stasis. While there is likely some collateral flow from the right were branch of the main middle colic artery, the decision was made not to pursue further embolization for fear of result in ischemia. This embolization should significantly decrease the pressure head to the site of bleeding facilitating healing. The catheters were removed. Hemostasis was attained with the assistance of a 6 French Angio-Seal device. IMPRESSION: 1. Small focus of active arterial extravasation at the hepatic flexure. 2. Successful coil embolization of an accessory branch of the middle colic artery. Signed, Criselda Peaches, MD, [place] [facility] Radiology Electronically Signed   By: Jacqulynn Cadet M.D.   On: 09/01/2019 12:31   IR EMBO ART  VEN HEMORR LYMPH EXTRAV  INC GUIDE ROADMAPPING  Result Date: 09/01/2019 INDICATION: 69 year old female with acute active lower GI bleed. CT arteriogram localizes the bleeding to the hepatic flexure of the colon. EXAM: IR ULTRASOUND GUIDANCE VASC ACCESS RIGHT; ADDITIONAL ARTERIOGRAPHY;  SELECTIVE VISCERAL ARTERIOGRAPHY; IR EMBO ART VEN HEMORR LYMPH EXTRAV INC GUIDE ROADMAPPING 1. Ultrasound-guided vascular access right common femoral artery 2. Celiac catheterization with arteriogram 3. Superior mesenteric artery catheterization with arteriogram 4. Right colic artery with arteriogram 5. Accessory middle colic artery with arteriogram 6. Distal branch of accessory middle colic artery with arteriogram 7. Additional distal branch of accessory middle colic artery with arteriogram 8. Coil embolization MEDICATIONS: None ANESTHESIA/SEDATION: None. CONTRAST:  34m OMNIPAQUE IOHEXOL 300 MG/ML  SOLN FLUOROSCOPY TIME:  Fluoroscopy Time: 8 minutes 0 seconds (464 mGy). COMPLICATIONS: None immediate. PROCEDURE: Informed consent was obtained from the patient following explanation of the procedure, risks, benefits and alternatives. The patient understands, agrees and consents for the procedure. All questions were addressed. A time out was performed prior to the initiation of the procedure. Maximal barrier sterile technique utilized including caps, mask, sterile gowns, sterile gloves, large sterile drape, hand hygiene, and Betadine prep. The was interrogated with ultrasound and found to be widely patent. An image was obtained and stored for the medical record. Local anesthesia was attained by infiltration with 1% lidocaine. A small dermatotomy was made. Under real-time sonographic guidance, the vessel was punctured with a 21 gauge micropuncture needle. Using standard technique, the initial micro needle was exchanged over a 0.018 micro wire for a transitional 4 [place] sheath. The micro sheath was then exchanged over a 0.035 wire for a 5 French vascular sheath. A C2 cobra catheter was advanced over a Bentson wire into the abdominal aorta. The celiac axis was  catheterized. Arteriography was performed. Conventional celiac arterial anatomy. No evidence of replaced middle colic artery. No evidence of active  hemorrhage. The catheter was next advanced into the superior mesenteric artery. A superior mesenteric arteriogram was performed. There appears to be a faint focus of contrast extravasation in the region of the hepatic flexure. This appears to represent a watershed zone between the main and an accessory middle colic artery. A renegade ST microcatheter was advanced over a Fathom 16 wire. The first artery selected was the right colic artery. Arteriography was performed. No evidence of active hemorrhage. The artery was then used to select the accessory middle colic artery. Arteriography was performed. There is a small focus of active hemorrhage in the hepatic flexure. The active hemorrhage appears to be arising from a region of numerous small thread like arteries. In an effort to perform a more selective embolization, the microcatheter was advanced into a more inferior distal branch of the accessory middle colic artery. Arteriography was performed. There is persistent mild hemorrhage. However, there is significant cross flow between the arterial branches. Coil embolization was initiated. This distal accessory branch was successfully coil embolized. The catheter was then advanced into the more superior distal branch of the accessory middle colic artery. There is persistent contrast extravasation. Therefore, coil embolization was performed again. The coil pack was carried back into the main trunk of the accessory middle colic artery. Follow-up arteriography confirms complete stasis. While there is likely some collateral flow from the right were branch of the main middle colic artery, the decision was made not to pursue further embolization for fear of result in ischemia. This embolization should significantly decrease the pressure head to the site of bleeding facilitating healing. The catheters were removed. Hemostasis was attained with the assistance of a 6 French Angio-Seal device. IMPRESSION: 1. Small focus of active  arterial extravasation at the hepatic flexure. 2. Successful coil embolization of an accessory branch of the middle colic artery. Signed, Criselda Peaches, MD, [place] [facility] Radiology Electronically Signed   By: Jacqulynn Cadet M.D.   On: 09/01/2019 12:31     LOS: 6 days   Time spent: More than 50% of that time was spent in counseling and/or coordination of care.  Antonieta Pert, MD [facility]  09/02/2019, 10:35 AM

## 2019-09-03 LAB — GLUCOSE, CAPILLARY
Glucose-Capillary: 75 mg/dL (ref 70–99)
Glucose-Capillary: 66 mg/dL — ABNORMAL LOW (ref 70–99)
Glucose-Capillary: 119 mg/dL — ABNORMAL HIGH (ref 70–99)
Glucose-Capillary: 79 mg/dL (ref 70–99)
Glucose-Capillary: 77 mg/dL (ref 70–99)
Glucose-Capillary: 73 mg/dL (ref 70–99)
Glucose-Capillary: 67 mg/dL — ABNORMAL LOW (ref 70–99)
Glucose-Capillary: 72 mg/dL (ref 70–99)

## 2019-09-03 LAB — CBC
HCT: 27.8 % — ABNORMAL LOW (ref 36.0–46.0)
HCT: 30.9 % — ABNORMAL LOW (ref 36.0–46.0)
Hemoglobin: 9.1 g/dL — ABNORMAL LOW (ref 12.0–15.0)
Hemoglobin: 9.9 g/dL — ABNORMAL LOW (ref 12.0–15.0)
MCH: 31 pg (ref 26.0–34.0)
MCH: 30.8 pg (ref 26.0–34.0)
MCHC: 32.7 g/dL (ref 30.0–36.0)
MCHC: 32 g/dL (ref 30.0–36.0)
MCV: 94.6 fL (ref 80.0–100.0)
MCV: 96.3 fL (ref 80.0–100.0)
Platelets: 144 10*3/uL — ABNORMAL LOW (ref 150–400)
Platelets: 159 10*3/uL (ref 150–400)
RBC: 2.94 MIL/uL — ABNORMAL LOW (ref 3.87–5.11)
RBC: 3.21 MIL/uL — ABNORMAL LOW (ref 3.87–5.11)
RDW: 20.4 % — ABNORMAL HIGH (ref 11.5–15.5)
RDW: 21.1 % — ABNORMAL HIGH (ref 11.5–15.5)
WBC: 7.6 10*3/uL (ref 4.0–10.5)
WBC: 7.6 10*3/uL (ref 4.0–10.5)
nRBC: 0.3 % — ABNORMAL HIGH (ref 0.0–0.2)
nRBC: 0.4 % — ABNORMAL HIGH (ref 0.0–0.2)

## 2019-09-03 LAB — BPAM RBC
Blood Product Expiration Date: 202104272359
Blood Product Expiration Date: 202104272359
Blood Product Expiration Date: 202105012359
ISSUE DATE / TIME: 202103251757
ISSUE DATE / TIME: 202103262142
ISSUE DATE / TIME: 202103271534
Unit Type and Rh: 5100
Unit Type and Rh: 5100
Unit Type and Rh: 5100

## 2019-09-03 LAB — TYPE AND SCREEN
ABO/RH(D): O POS
Antibody Screen: NEGATIVE
Unit division: 0
Unit division: 0
Unit division: 0

## 2019-09-03 LAB — BASIC METABOLIC PANEL
Anion gap: 11 (ref 5–15)
BUN: 13 mg/dL (ref 8–23)
CO2: 25 mmol/L (ref 22–32)
Calcium: 7.5 mg/dL — ABNORMAL LOW (ref 8.9–10.3)
Chloride: 101 mmol/L (ref 98–111)
Creatinine, Ser: 3.87 mg/dL — ABNORMAL HIGH (ref 0.44–1.00)
GFR calc Af Amer: 13 mL/min — ABNORMAL LOW (ref >60)
GFR calc non Af Amer: 11 mL/min — ABNORMAL LOW (ref >60)
Glucose, Bld: 84 mg/dL (ref 70–99)
Potassium: 3.6 mmol/L (ref 3.5–5.1)
Sodium: 137 mmol/L (ref 135–145)

## 2019-09-03 LAB — HEMOGLOBIN AND HEMATOCRIT, BLOOD
HCT: 29.9 % — ABNORMAL LOW (ref 36.0–46.0)
HCT: 28.5 % — ABNORMAL LOW (ref 36.0–46.0)
HCT: 29.4 % — ABNORMAL LOW (ref 36.0–46.0)
HCT: 27.4 % — ABNORMAL LOW (ref 36.0–46.0)
Hemoglobin: 9.9 g/dL — ABNORMAL LOW (ref 12.0–15.0)
Hemoglobin: 9.3 g/dL — ABNORMAL LOW (ref 12.0–15.0)
Hemoglobin: 9.5 g/dL — ABNORMAL LOW (ref 12.0–15.0)
Hemoglobin: 9 g/dL — ABNORMAL LOW (ref 12.0–15.0)

## 2019-09-03 LAB — PROTIME-INR
INR: 1.3 — ABNORMAL HIGH (ref 0.8–1.2)
Prothrombin Time: 16.3 seconds — ABNORMAL HIGH (ref 11.4–15.2)

## 2019-09-03 LAB — PREALBUMIN: Prealbumin: 8.6 mg/dL — ABNORMAL LOW (ref 18–38)

## 2019-09-03 MED ORDER — TRANEXAMIC ACID-NACL 1000-0.7 MG/100ML-% IV SOLN
1000.0000 mg | INTRAVENOUS | Status: AC
  Filled 2019-09-03 (×2): qty 100

## 2019-09-03 MED ORDER — DEXTROSE 50 % IV SOLN
INTRAVENOUS | Status: AC
  Filled 2019-09-03: qty 50

## 2019-09-03 MED ORDER — TECHNETIUM TC 99M-LABELED RED BLOOD CELLS IV KIT: 25.0000 | PACK | Freq: Once | INTRAVENOUS | Status: DC | PRN

## 2019-09-03 MED ORDER — CHLORHEXIDINE GLUCONATE CLOTH 2 % EX PADS
6.0000 | MEDICATED_PAD | Freq: Every day | CUTANEOUS | Status: DC
  Administered 2019-09-03 – 2019-09-12 (×9): 6 via TOPICAL

## 2019-09-03 MED ORDER — DEXTROSE 50 % IV SOLN
12.5000 g | INTRAVENOUS | Status: AC
  Administered 2019-09-03: 12.5 g via INTRAVENOUS

## 2019-09-03 NOTE — Progress Notes (Addendum)
[facility] Progress Note  CC:  Lower GI bleed   Subjective:  She is awake, c/o fatigue. Son at bed side. She is sitting in a moderate amount of dark red blood.  CMA at bed side reported this is the third episode of a moderate amount of blood from the rectum within 30 minutes.   Objective:   Tagged RBC scan 09/02/2019: GI bleeding study positive for GI bleed in the right upper quadrant, likely hepatic flexure of the colon. This is in same area of that seen on prior abdominal CTA and recently embolized  08/16/2019 EGD: Afew gastric polyps biopsied: hyperplastic polyp with eroded granulation tissue and surface reactive change. Mild, nonbleeding, erosive gastropathy biopsied:Mild reactive gastropathy with some surface hyperplastic change, no H. Pylori.Duodenal nodule biopsied:peptic duodenitis.  .  08/29/2019 colonoscopy: 6 polyps removed located in right and left colon. Left colon diverticulosis. Solitary rectal ulcer, no stigmata of bleeding. Pathology shows tubular adenomas,noHGD. Ulcer not biopsied.  3/25IR arteriogram:  bleeding in the hepatic flexure ands/pembolization  Vital signs in last 24 hours: Temp:  [97.6 F (36.4 C)-98.7 F (37.1 C)] 97.6 F (36.4 C) (03/28 0436) Pulse Rate:  [93-105] 105 (03/28 0436) Resp:  [14-18] 18 (03/28 0436) BP: (82-102)/(36-54) 102/36 (03/28 0436) SpO2:  [90 %-100 %] 100 % (03/28 0436) Weight:  [90.9 kg] 90.9 kg (03/28 0441) Last BM Date: 09/02/19(Bloody stool noted) General:  Ill appearing female. Fatigued. Alert and conversant.  Heart: RRR, bruit from LUE fistula auscultated in chest.  Pulm:  Diminished breath sounds throughout.  Abdomen: Soft, nontender, hypoactive BS x 4 quadrants. Mid line lower abdominal scar intact. A large amount of thick dark red blood on mattress pad.  Extremities:  Without edema. Neurologic:  Alert and  oriented x4;  grossly normal neurologically. Psych:  Alert and cooperative.  Normal mood and affect.  Intake/Output from previous day: No intake/output data recorded. Intake/Output this shift: No intake/output data recorded.  Lab Results: Recent Labs    09/02/19 0444 09/02/19 0444 09/02/19 1235 09/02/19 1434 09/03/19 0250  WBC 6.3  --  7.0  --  7.6  HGB 7.8*   < > 8.6* 7.3* 9.1*  HCT 24.5*   < > 26.6* 22.7* 27.8*  PLT 124*  --  139*  --  144*   < > = values in this interval not displayed.   BMET Recent Labs    09/01/19 0158 09/02/19 0444  NA 136 138  K 4.5 3.5  CL 101 103  CO2 23 24  GLUCOSE 85 88  BUN 12 <5*  CREATININE 3.77* 2.70*  CALCIUM 7.0* 7.2*   LFT No results for input(s): PROT, ALBUMIN, AST, ALT, ALKPHOS, BILITOT, BILIDIR, IBILI in the last 72 hours. PT/INR Recent Labs    09/01/19 0158  LABPROT 17.1*  INR 1.4*   Hepatitis Panel No results for input(s): HEPBSAG, HCVAB, HEPAIGM, HEPBIGM in the last 72 hours.  NM GI Blood Loss  Result Date: 09/03/2019 CLINICAL DATA:  GI bleed. EXAM: NUCLEAR MEDICINE GASTROINTESTINAL BLEEDING SCAN TECHNIQUE: Sequential abdominal images were obtained following intravenous administration of Tc-27m labeled red blood cells. RADIOPHARMACEUTICALS:  24.5 mCi Tc-31m pertechnetate in-vitro labeled red cells. COMPARISON:  Recent CT angiography and IR embolization of the hepatic flexure. FINDINGS: Imaging obtained over the course of 2 hours. There is radiotracer accumulation in the right upper quadrant in the region of the hepatic flexure that courses to the midline during first hour, appears at image 38/60. There is some to and  fro motion of radiotracer activity. This radiotracer persists in the central abdomen in the region of the transverse colon, however does travel to the left upper quadrant on second hour imaging. There is some increased tracer activity in the second hour. There is mild patient motion artifact. IMPRESSION: GI bleeding study positive for GI bleed in the right upper quadrant, likely hepatic  flexure of the colon. This is in same area of that seen on prior abdominal CTA and recently embolized. These results will be called to the ordering clinician or representative by the Radiologist Assistant, and communication documented in the PACS or [employer]. Electronically Signed   By: Keith Rake M.D.   On: 09/03/2019 01:34    Assessment / Plan:  6. 69 year old female s/p EGD and colonoscopy 3/23 which showed gastric polyps and erosive gastropathy, 6 polypectomies and one rectal ulcer with recurrent GI bleed,mostlikely diverticular hemorrhage. CTA 3/25 showed active bleeding at hepatic flexure. IR consulted, s/p arteriogram and embolization  of an accessory branch of the middle colic artery 1/01. She had slow rectal bleeding with Hg down to 6.6 on 3/26 for which she received a 4th unit of packed red blood cells. Post transfusion Hg 7.0 -> 8.6 -> 7.3 -> Persistent rectal bleeding yesterday evening with hypotension. BP 80-90/40 - 50's. Tagged RBC scan 3/27 positive for GI bleed in the right upper quadrant, likely hepatic flexure of the colon. This is in same area of that seen on prior abdominal CTA and recently embolized. 5th unit of PRBCs transfused 3/27.  Today Hg 9.1. She is currently actively bleeding, mattress pad with a large amount of dark red blood x 3 over the past 30 minutes.  Stat H/H ordered. Surgeon Dr. Lucia Gaskins has evaluated patient this morning. No immediate plans for colectomy as patient is high risk. Dr. Kathlene Cote IR  to consider re-evaluation today, however, any further embolization increases the risk for right colon infarct.  -No plans for repeat colonoscopy  -Await further recommendations from surgery and IR -Stat H/H now -Recommend transfer patient to ICU step down -NPO -Transfuse for Hg < 7, may need emergent transfusion if active bleeding worsens   2. 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  See plan in # 1  3.  Coagulopathy. Improved with Vitamin K ( 2.1  >>> 1.4 >> 1.3)  4.  ESRD on HD.  Creatinine 2.70. Last dialysis session 3/25.     Active Problems:   GIB (gastrointestinal bleeding)   Benign neoplasm of ascending colon   Diverticulosis of colon with hemorrhage   Rectal ulcer     LOS: 7 days   Roberta Bryant  09/03/2019, 7:56 AM  GI ATTENDING  Interval history data reviewed.  Agree with interval progress note as outlined above.  The patient continues to bleed despite interventions.  Has been hemodynamically stable.  I suspect she does have some element of platelet dysfunction due to her renal disease.  Also noted to have slightly elevated INR over normal range.  Appreciate surgical and interventional radiology input.  Continue supportive measures as you are doing.  Recommendation for transfer to stepdown unit seems reasonable.  We will continue to follow closely.  Docia Chuck. Geri Seminole., M.D. [facility] Division of Gastroenterology

## 2019-09-03 NOTE — Progress Notes (Addendum)
Pt back from NM GI bleeding scan. Pt denies pain. Pad changed d/t noted scant amt blood stained in the pad.

## 2019-09-03 NOTE — Plan of Care (Signed)
Notified general surgery on call MD of pt's abnormal NM scan ( bleeding at hepatic flexure of colon). Michela Pitcher they will see pt today to eval for possible colectomy.

## 2019-09-03 NOTE — Plan of Care (Signed)

## 2019-09-03 NOTE — Progress Notes (Addendum)
[facility]:  [phone] General Surgery Progress Note   LOS: 7 days  POD -  5 Days Post-Op  Chief Complaint: GI bleed  Assessment and Plan: 1.  Lower GI bleed  Angio with embolization on 3/25 of hepatic flexure area  Continues to have evidence of slow bleed - she has been stable  Tagged RBC yesterday shows continued bleeding from hepatic flexure area  Hgb - 9.9 at 07:27 today  Will possibly need right hemicolectomy - discussed at length with patient and her son, [name].  She is very high risk for surgery and potential post op complications.  If the patient shows evidence of this stopping - can cancel surgery  2.  Check prealbumin to evaluate nutritional status for potential surgery  Albumin - 1.5 on 3/21 3.  ESRD - dialysis on MWF 4.  History of CHF 5.  HTN 6.  CAD 7.  History of stroke 8.  DM 9.  Deconditioned 10.  She had Covid about 8 weeks ago   Active Problems:   GIB (gastrointestinal bleeding)   Benign neoplasm of ascending colon   Diverticulosis of colon with hemorrhage   Rectal ulcer  Subjective:  No abdominal pain.  Having thin bloody BM at this time.  Son [name] in room.  I spent a good amount of time going over the potential surgery, the potential complications, and the long term consequences.  Objective:   Vitals:   09/03/19 0134 09/03/19 0436  BP: (!) 101/54 (!) 102/36  Pulse: 95 (!) 105  Resp: 18 18  Temp: 97.9 F (36.6 C) 97.6 F (36.4 C)  SpO2: 99% 100%     Intake/Output from previous day:  No intake/output data recorded.  Intake/Output this shift:  No intake/output data recorded.   Physical Exam:   General: Obese F who is alert and oriented.    HEENT: Normal. Pupils equal. .   Lungs: Clear   Abdomen: Soft   Lab Results:    Recent Labs    09/02/19 1235 09/02/19 1235 09/02/19 1434 09/03/19 0250  WBC 7.0  --   --  7.6  HGB 8.6*   < > 7.3* 9.1*  HCT 26.6*   < > 22.7* 27.8*  PLT 139*  --   --  144*   < > =  values in this interval not displayed.    BMET   Recent Labs    09/01/19 0158 09/02/19 0444  NA 136 138  K 4.5 3.5  CL 101 103  CO2 23 24  GLUCOSE 85 88  BUN 12 <5*  CREATININE 3.77* 2.70*  CALCIUM 7.0* 7.2*    PT/INR   Recent Labs    09/01/19 0158  LABPROT 17.1*  INR 1.4*    ABG  No results for input(s): PHART, HCO3 in the last 72 hours.  Invalid input(s): PCO2, PO2   Studies/Results:  NM GI Blood Loss  Result Date: 09/03/2019 CLINICAL DATA:  GI bleed. EXAM: NUCLEAR MEDICINE GASTROINTESTINAL BLEEDING SCAN TECHNIQUE: Sequential abdominal images were obtained following intravenous administration of Tc-42m labeled red blood cells. RADIOPHARMACEUTICALS:  24.5 mCi Tc-61m pertechnetate in-vitro labeled red cells. COMPARISON:  Recent CT angiography and IR embolization of the hepatic flexure. FINDINGS: Imaging obtained over the course of 2 hours. There is radiotracer accumulation in the right upper quadrant in the region of the hepatic flexure that courses to the midline during first hour, appears at image 38/60. There is some to and fro motion of radiotracer activity. This radiotracer  persists in the central abdomen in the region of the transverse colon, however does travel to the left upper quadrant on second hour imaging. There is some increased tracer activity in the second hour. There is mild patient motion artifact. IMPRESSION: GI bleeding study positive for GI bleed in the right upper quadrant, likely hepatic flexure of the colon. This is in same area of that seen on prior abdominal CTA and recently embolized. These results will be called to the ordering clinician or representative by the Radiologist Assistant, and communication documented in the PACS or [employer]. Electronically Signed   By: Keith Rake M.D.   On: 09/03/2019 01:34     Anti-infectives:   Anti-infectives (From admission, onward)   None      Alphonsa Overall, MD, [facility] Surgery Office:  [phone] 09/03/2019

## 2019-09-03 NOTE — Progress Notes (Addendum)
[facility] Progress Note   Dialysis Orders: [facility] MWF 3.5 hr EDW 85 400/A 1.5 3 K 2.25 Ca 36 degrees profile 4 var Na 148 linear left upper AVF heparin 4000 with 1500 mid tmt Mircera 100 q 2 wks -last had 60 on 3/10 calcitriol 1 Binders: fosrenol 2 gm ac  Assessment/Plan: 1. ABLA - secondary to diverticular bleed - s/p colonoscopy with mult tics, 6 polyps removed, linear non bleeding rectal ulcer - s/p transfusion;. Recurred and now s/p IR embolization.  Bleeding scan today - + RUQ likely hepatic flexure - same area of prior embolization. Still having ongoing bloody stools GI/[facility]S following - may need hemicolectomy - has been d/w son. Given ongoing decline and complex problems, may need a more formal [place] consult. Functional status previously poor and very malnourished with low albumin places her at high risk for poor wound healing.  2. ESRD- MWF - next HD Monday - may need to use alb - no heparin 3. AVF issues - recent bleeding issues with access - no heparin HD. Closer exam of access and access arm shows some generalized edema more so than the right.- last f'gram 05/2018. AF have been variable without intervention over the last year. Arm remains swollen but AVF functional - defer evaluation for now - given ongoing bleeding issues 4. Hypotension/volume- BP low/stable. UF as tolerated.- kept even Friday, 350 ml off Wed. Use alb to support BP 5. ABLAnemia Continue Aranesp 126mcg q Wed.s/p  5U PRBC admission - the last 3/27  total  hgb 9.9 this am  6. Metabolic bone disease-Phos low without binders - on Calcitriol for Ca support I think since hx parathyroidectomy; corrected Ca ok since alb is so low 7. Severe malnutrition- alb severely low - continue pro-stat + nepro. 8. Anxeity/depression 9. CAD 10. DM/hypothyroidism- per primary 11. DNR 12. GOC -for d/c to SNF when medically stable -  Myriam Jacobson, PA-C [place] [phone] 09/03/2019,8:48  AM  LOS: 7 days   Subjective:  Ongoing melanic bloody stools- oozing.  Objective Vitals:   09/02/19 1855 09/03/19 0134 09/03/19 0436 09/03/19 0441  BP: (!) 82/48 (!) 101/54 (!) 102/36   Pulse: 96 95 (!) 105   Resp: 14 18 18    Temp: 98.7 F (37.1 C) 97.9 F (36.6 C) 97.6 F (36.4 C)   TempSrc: Oral Oral Oral   SpO2: 92% 99% 100%   Weight:    90.9 kg  Height:       Physical Exam General: obese very sleepy elderly female - NAD at rest Heart: RRR Lungs: dim BM poor effort Abdomen: obese soft R sided tenderness Extremities: no sig LE edema Dialysis Access: left upper AVF + bruit with left arm edema  Additional Objective Labs: Basic Metabolic Panel: Recent Labs  Lab 08/12/2019 1608 08/14/2019 1608 08/30/19 0418 08/30/19 0418 08/31/19 0141 09/01/19 0158 09/02/19 0444  NA 136   < > 138   < > 136 136 138  K 4.1   < > 3.7   < > 3.9 4.5 3.5  CL 100   < > 103   < > 100 101 103  CO2 25   < > 23   < > 24 23 24   GLUCOSE 86   < > 105*   < > 114* 85 88  BUN 12   < > 14   < > 7* 12 <5*  CREATININE 3.33*   < > 4.53*   < > 2.88* 3.77* 2.70*  CALCIUM 6.7*   < > 7.0*   < > 7.1* 7.0* 7.2*  PHOS 1.8*  --  1.9*  --   --   --   --    < > = values in this interval not displayed.   Liver Function Tests: Recent Labs  Lab 08/21/2019 1247 08/08/2019 1608 08/30/19 0418  AST 36  --   --   ALT 17  --   --   ALKPHOS 50  --   --   BILITOT 1.4*  --   --   PROT 4.3*  --   --   ALBUMIN 1.5* 1.6* 1.6*   No results for input(s): LIPASE, AMYLASE in the last 168 hours. CBC: Recent Labs  Lab 08/25/2019 1247 08/25/2019 1321 09/01/19 2026 09/01/19 2026 09/02/19 0444 09/02/19 0444 09/02/19 1235 09/02/19 1235 09/02/19 1434 09/03/19 0250 09/03/19 0727  WBC 5.2   < > 5.6   < > 6.3   < > 7.0  --   --  7.6 7.6  NEUTROABS 3.4  --   --   --   --   --   --   --   --   --   --   HGB 4.9*   < > 6.6*   < > 7.8*   < > 8.6*   < > 7.3* 9.1* 9.9*  HCT 16.0*   < > 20.4*   < > 24.5*   < > 26.6*   < > 22.7*  27.8* 30.9*  MCV 127.0*   < > 99.5  --  94.6  --  94.3  --   --  94.6 96.3  PLT 151   < > 114*   < > 124*   < > 139*  --   --  144* 159   < > = values in this interval not displayed.   Blood Culture    Component Value Date/Time   SDES URINE, CLEAN CATCH 01/04/2018 1524   SPECREQUEST  01/04/2018 1524    NONE Performed at [facility] [address] [place], [place] [address]    CULT MULTIPLE SPECIES PRESENT, SUGGEST RECOLLECTION (A) 01/04/2018 1524   REPTSTATUS 01/05/2018 FINAL 01/04/2018 1524    Cardiac Enzymes: No results for input(s): CKTOTAL, CKMB, CKMBINDEX, TROPONINI in the last 168 hours. CBG: Recent Labs  Lab 09/02/19 1701 09/03/19 0130 09/03/19 0429 09/03/19 0501 09/03/19 0753  GLUCAP 82 75 66* 119* 79   Iron Studies: No results for input(s): IRON, TIBC, TRANSFERRIN, FERRITIN in the last 72 hours. Lab Results  Component Value Date   INR 1.3 (H) 09/03/2019   INR 1.4 (H) 09/01/2019   INR 1.5 (H) 08/30/2019   Studies/Results: NM GI Blood Loss  Result Date: 09/03/2019 CLINICAL DATA:  GI bleed. EXAM: NUCLEAR MEDICINE GASTROINTESTINAL BLEEDING SCAN TECHNIQUE: Sequential abdominal images were obtained following intravenous administration of Tc-5m labeled red blood cells. RADIOPHARMACEUTICALS:  24.5 mCi Tc-51m pertechnetate in-vitro labeled red cells. COMPARISON:  Recent CT angiography and IR embolization of the hepatic flexure. FINDINGS: Imaging obtained over the course of 2 hours. There is radiotracer accumulation in the right upper quadrant in the region of the hepatic flexure that courses to the midline during first hour, appears at image 38/60. There is some to and fro motion of radiotracer activity. This radiotracer persists in the central abdomen in the region of the transverse colon, however does travel to the left upper quadrant on second hour imaging. There is some increased tracer activity in  the second hour. There is mild patient motion artifact.  IMPRESSION: GI bleeding study positive for GI bleed in the right upper quadrant, likely hepatic flexure of the colon. This is in same area of that seen on prior abdominal CTA and recently embolized. These results will be called to the ordering clinician or representative by the Radiologist Assistant, and communication documented in the PACS or [employer]. Electronically Signed   By: Keith Rake M.D.   On: 09/03/2019 01:34   Medications: . sodium chloride    . sodium chloride     . sodium chloride   Intravenous Once  . calcitRIOL  1 mcg Oral Q M,W,F-HD  . Chlorhexidine Gluconate Cloth  6 each Topical Q0600  . darbepoetin (ARANESP) injection - DIALYSIS  100 mcg Intravenous Q Wed-HD  . feeding supplement (NEPRO CARB STEADY)  237 mL Oral BID BM  . feeding supplement (PRO-STAT SUGAR FREE 64)  30 mL Oral BID  . insulin aspart  0-6 Units Subcutaneous TID WC  . levothyroxine  50 mcg Oral QAC breakfast  . multivitamin  1 tablet Oral QHS  . pantoprazole  40 mg Oral Q0600  . phytonadione  10 mg Oral Daily

## 2019-09-03 NOTE — Progress Notes (Signed)
Interventional Radiology Progress Note  Called by Dr. Maren Beach yesterday and Carl Best, NP today regarding evidence of recurrent lower GI bleeding. Bleeding scan last night near midnight demonstrated bleeding from right colon near hepatic flexure.  Reviewed Dr. Katrinka Blazing arteriogram and embolization procedure from late night 3/25 into early morning 3/26 where embolization of much of the middle colic arterial supply to the hepatic flexure was performed to treat focal area of bleeding/contrast extravasation at the hepatic flexure of colon. She has likely reperfused this region via other branches.  My fear of performing additional embolization is high risk of significant colonic ischemia and/or infarction. I discussed case with Dr. Lucia Gaskins of General Surgery. Will hold on any arteriographic intervention for now. If bleeding continues and she does not proceed to surgery, we could consider another arteriogram to see if there is a potential branch that we could embolize without high risk of colonic infarction.  Venetia Night. Kathlene Cote, M.D Pager:  [phone]

## 2019-09-03 NOTE — Progress Notes (Addendum)
[facility] Radiology staff called with results of NM GI Bleeding scan, notified on call  B. Kyere,NP via text page.

## 2019-09-03 NOTE — Progress Notes (Signed)
Called and notified pt's son, [place], regarding pt being transferred to another department and room number... Son verbalized understanding

## 2019-09-03 NOTE — Progress Notes (Signed)
PROGRESS NOTE    Roberta Bryant  SPQ:[id number] DOB: 1950-09-09 DOA: 09/03/2019 PCP: [name], MD   Brief Narrative: 51 YOF ESRD on HD, hypertension, CAD, diabetes, hypothyroidism presented with acute blood loss anemia in the setting of GI bleed, was seen by GI, nephrology.  Underwent EGD 3/22 nondiagnostic and colonoscopy 3/23-that showed left colon diverticulosis no stigmata of bleeding.Continue to have rectal bleeding underwent CTA 3/25 with active bleeding in the hepatic flexure and had embolization done.  Patient continues to have intermittent rectal bleeding, needing blood transfusions.  Subjective: Patient appears anxious.  Nursing cleaning her bottom. Rectal bleeding dark/red blood x3 after last cbc check this am Son at bedside. GI,Surgery has seen her urgently.   Assessment & Plan:  Recurrent Diverticular GI bleeding: s/p EGD 08/2008 colonoscopy 3/23-with mild nonbleeding erosive gastropathy, peptic duodenitis, 6 polyps removed from right colon and left colon and had left colon diverticulosis no stigmata of bleeding. Pathology shows tubular adenomas -again hypotensive and anemic with rectal bleeding 3/25 underwent CTA that showed active bleeding at hepatic flexure, underwent arteriogram and embolization 3/25- continues to have bleeding recurrently- NM Bleeding scan 3/27- + hepatic flexure bleeding again- gi, ir, surgery seeing thsi am- may need possible partial colectomy- IR Dr Kathlene Cote with d/w Surgery for further plan. Bp soft. Will consult PCCM to ICU transfer eval. Stat h/h ordered Discussed with Dr. Doyne Keel from [place] recommends current plan continue to monitor and no indication for ICU transfer currently unless patient is hypotensive.  Advise tranexamic acid x1 pharmacy consulted.  Acute blood loss anemia from gi bleeding and anemia of chronic kidney disease: s/p 4 unit PRBC so far-last one 3/27. Continue Aranesp, no iron deficiency per study 3/22.  Check H&H every 4 hour  and transfuse as needed. Recent Labs  Lab 09/02/19 0444 09/02/19 1235 09/02/19 1434 09/03/19 0250 09/03/19 0727  HGB 7.8* 8.6* 7.3* 9.1* 9.9*  HCT 24.5* 26.6* 22.7* 27.8* 30.9*   Bleeding from dialysis fistula-improved.  ESRD on HD MWF -last HD 3/26.Appreciate nephrology input.Denies shortness of breath.  Nephrology on board.  LUE aVF pulsatile with LUE larger than RUE ?CV Stenosis- fistulogram once stable per nephrology.    Thrombocytopenia: stable, monitor Recent Labs  Lab 09/01/19 2026 09/02/19 0444 09/02/19 1235 09/03/19 0250 09/03/19 0727  PLT 114* 124* 139* 144* 159   Elevated INR 2.0 on admission, improved to 1.3.  Continue daily vitamin K. Recent Labs  Lab 08/31/2019 1247 09/04/2019 1206 08/30/19 0418 09/01/19 0158 09/03/19 0727  INR 2.1* 1.5* 1.5* 1.4* 1.3*   Hypertension: Needed IV fluid boluses and blood transfusions. BP soft. Monitor  Metabolic bone disease:continue calcitriol.  Anxiety/depression:Mood is stable resume home Klonopin as tolerated  CAD:No chest pain.Monitor.Home meds aspirin Coreg on hold in the setting of GI bleed, soft blood pressure.    Diabetes mellitus:Hemoglobin A1c 4.9.Continue sliding scale insulin.Hold her home Januvia. Recent Labs  Lab 09/02/19 1701 09/03/19 0130 09/03/19 0429 09/03/19 0501 09/03/19 0753  GLUCAP 82 75 66* 119* 79   Hypothyroidism: Continue home Synthroid  Deconditioning: Continue PT OT likely need a skilled nursing facility placement as suggested by PT.   Intermittent mild confusion/acute metabolic encephalopathy in the setting of GI bleed.  Monitor and keep on fall precaution.  Family reports patient had intermittent wheezing before also  Nutrition: Monitor blood sugar,  Diet Order            Diet NPO time specified  Diet effective midnight        Diet  clear liquid Room service appropriate? Yes; Fluid consistency: Thin; Fluid restriction: 1200 mL Fluid  Diet effective now              Nutrition  Problem: Inadequate oral intake Etiology: altered GI function Signs/Symptoms: meal completion < 25% Interventions: MVI, Prostat, Nepro shake Body mass index is 41.88 kg/m.   DVT prophylaxis:SCD-holding chemical prophylaxis due to anemia Code Status:FULL Family Communication: plan of care discussed with patient at bedside. Son at bedside was updated. Disposition Plan: Patient is from:home Anticipated Disposition: to  SNF Barriers to discharge or conditions that needs to be met prior to discharge: Patient admitted with anemia 4.9 underwent EGD and colonoscopy, with ongoing rectal bleeding underwent IR embolization of the active bleeding and hepatic flexure, continues to have ongoing intermittent bleeding surgery GI and IR following. Transfer to Progressive care.  Monitor closely and low threshold for ICU transfer  Consultants: General surgery, nephrology, gastroenterology, interventional radiology, PCCM.   Procedures: 08/17/2019 EGD: A few gastric polyps biopsied: hyperplastic polyp with eroded granulation tissue and surface reactive change.  Mild, nonbleeding, erosive gastropathy biopsied: Mild reactive gastropathy with some surface hyperplastic change, no H. Pylori.  Duodenal nodule biopsied: peptic duodenitis.  Protonix 40/day in place, Prilosec 20/daily, 81 ASA/daily PTA.  .   08/20/2019 colonoscopy.  6 polyps removed located in right and left colon.  Left colon diverticulosis.  Solitary rectal ulcer, no stigmata of bleeding. Pathology shows tubular adenomas, no HGD.  Ulcer not biopsied.   3/25 IR arteriogram with bleeding in the hepatic flexure and s/p embolization  Microbiology:see note  Medications: Scheduled Meds: . sodium chloride   Intravenous Once  . calcitRIOL  1 mcg Oral Q M,W,F-HD  . Chlorhexidine Gluconate Cloth  6 each Topical Q0600  . darbepoetin (ARANESP) injection - DIALYSIS  100 mcg Intravenous Q Wed-HD  . feeding supplement (NEPRO CARB STEADY)  237 mL Oral BID BM    . feeding supplement (PRO-STAT SUGAR FREE 64)  30 mL Oral BID  . insulin aspart  0-6 Units Subcutaneous TID WC  . levothyroxine  50 mcg Oral QAC breakfast  . multivitamin  1 tablet Oral QHS  . pantoprazole  40 mg Oral Q0600  . phytonadione  10 mg Oral Daily   Continuous Infusions: . sodium chloride    . sodium chloride      Antimicrobials: Anti-infectives (From admission, onward)   None       Objective: Vitals: Today's Vitals   09/03/19 0441 09/03/19 0815 09/03/19 0908 09/03/19 0951  BP:   (!) 97/57   Pulse:   91   Resp:   14   Temp:   97.6 F (36.4 C)   TempSrc:   Oral   SpO2:   97%   Weight: 90.9 kg     Height:      PainSc:  0-No pain  0-No pain    Intake/Output Summary (Last 24 hours) at 09/03/2019 1114 Last data filed at 09/03/2019 0800 Gross per 24 hour  Intake 0 ml  Output --  Net 0 ml   Filed Weights   09/01/19 1441 09/01/19 1805 09/03/19 0441  Weight: 83.5 kg 83.8 kg 90.9 kg   Weight change: 7.4 kg   Intake/Output from previous day: No intake/output data recorded. Intake/Output this shift: No intake/output data recorded.  Examination:  General exam: Alert awake anxious, morbidly obese.   HEENT:Oral mucosa moist, Ear/Nose WNL grossly,dentition normal. Respiratory system: bilaterally clear, non tender. Cardiovascular system: S1 & S2 +, regular, No JVD.  Gastrointestinal system: Abdomen soft, NT,ND, BS+. Nervous System:Alert, awake, moving extremities and grossly nonfocal Extremities: No edema, distal peripheral pulses palpable.  Skin: No rashes,no icterus. MSK: Normal muscle bulk,tone, power  Data Reviewed: I have personally reviewed following labs and imaging studies CBC: Recent Labs  Lab 08/17/2019 1247 09/04/2019 1321 09/01/19 2026 09/01/19 2026 09/02/19 0444 09/02/19 1235 09/02/19 1434 09/03/19 0250 09/03/19 0727  WBC 5.2   < > 5.6  --  6.3 7.0  --  7.6 7.6  NEUTROABS 3.4  --   --   --   --   --   --   --   --   HGB 4.9*   < > 6.6*    < > 7.8* 8.6* 7.3* 9.1* 9.9*  HCT 16.0*   < > 20.4*   < > 24.5* 26.6* 22.7* 27.8* 30.9*  MCV 127.0*   < > 99.5  --  94.6 94.3  --  94.6 96.3  PLT 151   < > 114*  --  124* 139*  --  144* 159   < > = values in this interval not displayed.   Basic Metabolic Panel: Recent Labs  Lab 08/10/2019 1608 09/04/2019 1608 08/30/19 0418 08/31/19 0141 09/01/19 0158 09/02/19 0444 09/03/19 0727  NA 136   < > 138 136 136 138 137  K 4.1   < > 3.7 3.9 4.5 3.5 3.6  CL 100   < > 103 100 101 103 101  CO2 25   < > '23 24 23 24 25  '$ GLUCOSE 86   < > 105* 114* 85 88 84  BUN 12   < > 14 7* 12 <5* 13  CREATININE 3.33*   < > 4.53* 2.88* 3.77* 2.70* 3.87*  CALCIUM 6.7*   < > 7.0* 7.1* 7.0* 7.2* 7.5*  MG  --   --  1.6*  --   --   --   --   PHOS 1.8*  --  1.9*  --   --   --   --    < > = values in this interval not displayed.   GFR: Estimated Creatinine Clearance: 13.2 mL/min (A) (by C-G formula based on SCr of 3.87 mg/dL (H)). Liver Function Tests: Recent Labs  Lab 08/21/2019 1247 08/11/2019 1608 08/30/19 0418  AST 36  --   --   ALT 17  --   --   ALKPHOS 50  --   --   BILITOT 1.4*  --   --   PROT 4.3*  --   --   ALBUMIN 1.5* 1.6* 1.6*   No results for input(s): LIPASE, AMYLASE in the last 168 hours. No results for input(s): AMMONIA in the last 168 hours. Coagulation Profile: Recent Labs  Lab 08/12/2019 1247 08/21/2019 1206 08/30/19 0418 09/01/19 0158 09/03/19 0727  INR 2.1* 1.5* 1.5* 1.4* 1.3*   Cardiac Enzymes: No results for input(s): CKTOTAL, CKMB, CKMBINDEX, TROPONINI in the last 168 hours. BNP (last 3 results) No results for input(s): PROBNP in the last 8760 hours. HbA1C: No results for input(s): HGBA1C in the last 72 hours. CBG: Recent Labs  Lab 09/02/19 1701 09/03/19 0130 09/03/19 0429 09/03/19 0501 09/03/19 0753  GLUCAP 82 75 66* 119* 79   Lipid Profile: No results for input(s): CHOL, HDL, LDLCALC, TRIG, CHOLHDL, LDLDIRECT in the last 72 hours. Thyroid Function Tests: No results  for input(s): TSH, T4TOTAL, FREET4, T3FREE, THYROIDAB in the last 72 hours. Anemia Panel: No results for input(s): VITAMINB12, FOLATE,  FERRITIN, TIBC, IRON, RETICCTPCT in the last 72 hours. Sepsis Labs: Recent Labs  Lab 08/25/2019 1247  LATICACIDVEN 1.8    Recent Results (from the past 240 hour(s))  Respiratory Panel by RT PCR (Flu A&B, Covid) - Nasopharyngeal Swab     Status: None   Collection Time: 09/06/2019  3:17 PM   Specimen: Nasopharyngeal Swab  Result Value Ref Range Status   SARS Coronavirus 2 by RT PCR NEGATIVE NEGATIVE Final    Comment: (NOTE) SARS-CoV-2 target nucleic acids are NOT DETECTED. The SARS-CoV-2 RNA is generally detectable in upper respiratoy specimens during the acute phase of infection. The lowest concentration of SARS-CoV-2 viral copies this assay can detect is 131 copies/mL. A negative result does not preclude SARS-Cov-2 infection and should not be used as the sole basis for treatment or other patient management decisions. A negative result may occur with  improper specimen collection/handling, submission of specimen other than nasopharyngeal swab, presence of viral mutation(s) within the areas targeted by this assay, and inadequate number of viral copies (<131 copies/mL). A negative result must be combined with clinical observations, patient history, and epidemiological information. The expected result is Negative. Fact Sheet for Patients:  PinkCheek.be Fact Sheet for Healthcare Providers:  GravelBags.it This test is not yet ap proved or cleared by the [place] FDA and  has been authorized for detection and/or diagnosis of SARS-CoV-2 by FDA under an Emergency Use Authorization (EUA). This EUA will remain  in effect (meaning this test can be used) for the duration of the COVID-19 declaration under Section 564(b)(1) of the Act, 21 U.S.C. section 360bbb-3(b)(1), unless the authorization is  terminated or revoked sooner.    Influenza A by PCR NEGATIVE NEGATIVE Final   Influenza B by PCR NEGATIVE NEGATIVE Final    Comment: (NOTE) The Xpert Xpress SARS-CoV-2/FLU/RSV assay is intended as an aid in  the diagnosis of influenza from Nasopharyngeal swab specimens and  should not be used as a sole basis for treatment. Nasal washings and  aspirates are unacceptable for Xpert Xpress SARS-CoV-2/FLU/RSV  testing. Fact Sheet for Patients: PinkCheek.be Fact Sheet for Healthcare Providers: GravelBags.it This test is not yet approved or cleared by the [place] FDA and  has been authorized for detection and/or diagnosis of SARS-CoV-2 by  FDA under an Emergency Use Authorization (EUA). This EUA will remain  in effect (meaning this test can be used) for the duration of the  Covid-19 declaration under Section 564(b)(1) of the Act, 21  U.S.C. section 360bbb-3(b)(1), unless the authorization is  terminated or revoked. Performed at [facility], [place] [address] [place], [place] [address]   MRSA PCR Screening     Status: None   Collection Time: 08/11/2019  9:21 PM   Specimen: Nasopharyngeal  Result Value Ref Range Status   MRSA by PCR NEGATIVE NEGATIVE Final    Comment:        The GeneXpert MRSA Assay (FDA approved for NASAL specimens only), is one component of a comprehensive MRSA colonization surveillance program. It is not intended to diagnose MRSA infection nor to guide or monitor treatment for MRSA infections. Performed at [facility], [place] [address], [place], [place] [address]       Radiology Studies: NM GI Blood Loss  Result Date: 09/03/2019 CLINICAL DATA:  GI bleed. EXAM: NUCLEAR MEDICINE GASTROINTESTINAL BLEEDING SCAN TECHNIQUE: Sequential abdominal images were obtained following intravenous administration of Tc-75mlabeled red blood cells. RADIOPHARMACEUTICALS:  24.5 mCi Tc-934mertechnetate  in-vitro labeled red  cells. COMPARISON:  Recent CT angiography and IR embolization of the hepatic flexure. FINDINGS: Imaging obtained over the course of 2 hours. There is radiotracer accumulation in the right upper quadrant in the region of the hepatic flexure that courses to the midline during first hour, appears at image 38/60. There is some to and fro motion of radiotracer activity. This radiotracer persists in the central abdomen in the region of the transverse colon, however does travel to the left upper quadrant on second hour imaging. There is some increased tracer activity in the second hour. There is mild patient motion artifact. IMPRESSION: GI bleeding study positive for GI bleed in the right upper quadrant, likely hepatic flexure of the colon. This is in same area of that seen on prior abdominal CTA and recently embolized. These results will be called to the ordering clinician or representative by the Radiologist Assistant, and communication documented in the PACS or [employer]. Electronically Signed   By: Keith Rake M.D.   On: 09/03/2019 01:34     LOS: 7 days   Time spent: More than 50% of that time was spent in counseling and/or coordination of care.  Antonieta Pert, MD [facility]  09/03/2019, 11:14 AM

## 2019-09-03 NOTE — Progress Notes (Signed)
Hypoglycemic Event  CBG: 66  Treatment: D50 25 mL (12.5 gm)  Symptoms: None  Follow-up CBG: Time:0501 CBG Result:119  Possible Reasons for Event: Inadequate meal intake  Comments/MD notified:B. Kyere,NP via text page    Roberta Bryant

## 2019-09-04 ENCOUNTER — Encounter (HOSPITAL_COMMUNITY): Payer: Self-pay

## 2019-09-04 LAB — BASIC METABOLIC PANEL
Anion gap: 15 (ref 5–15)
BUN: 18 mg/dL (ref 8–23)
CO2: 21 mmol/L — ABNORMAL LOW (ref 22–32)
Calcium: 7.6 mg/dL — ABNORMAL LOW (ref 8.9–10.3)
Chloride: 100 mmol/L (ref 98–111)
Creatinine, Ser: 4.81 mg/dL — ABNORMAL HIGH (ref 0.44–1.00)
GFR calc Af Amer: 10 mL/min — ABNORMAL LOW (ref >60)
GFR calc non Af Amer: 9 mL/min — ABNORMAL LOW (ref >60)
Glucose, Bld: 85 mg/dL (ref 70–99)
Potassium: 4.5 mmol/L (ref 3.5–5.1)
Sodium: 136 mmol/L (ref 135–145)

## 2019-09-04 LAB — HEMOGLOBIN AND HEMATOCRIT, BLOOD
HCT: 26 % — ABNORMAL LOW (ref 36.0–46.0)
HCT: 25.8 % — ABNORMAL LOW (ref 36.0–46.0)
HCT: 25.6 % — ABNORMAL LOW (ref 36.0–46.0)
HCT: 23.5 % — ABNORMAL LOW (ref 36.0–46.0)
HCT: 22 % — ABNORMAL LOW (ref 36.0–46.0)
Hemoglobin: 8.4 g/dL — ABNORMAL LOW (ref 12.0–15.0)
Hemoglobin: 8.5 g/dL — ABNORMAL LOW (ref 12.0–15.0)
Hemoglobin: 8.4 g/dL — ABNORMAL LOW (ref 12.0–15.0)
Hemoglobin: 7.6 g/dL — ABNORMAL LOW (ref 12.0–15.0)
Hemoglobin: 7.2 g/dL — ABNORMAL LOW (ref 12.0–15.0)

## 2019-09-04 LAB — GLUCOSE, CAPILLARY
Glucose-Capillary: 78 mg/dL (ref 70–99)
Glucose-Capillary: 77 mg/dL (ref 70–99)
Glucose-Capillary: 69 mg/dL — ABNORMAL LOW (ref 70–99)
Glucose-Capillary: 81 mg/dL (ref 70–99)

## 2019-09-04 LAB — PROTIME-INR
INR: 1.3 — ABNORMAL HIGH (ref 0.8–1.2)
Prothrombin Time: 15.8 seconds — ABNORMAL HIGH (ref 11.4–15.2)

## 2019-09-04 MED ORDER — ALBUMIN HUMAN 25 % IV SOLN
INTRAVENOUS | Status: AC
  Administered 2019-09-04: 25 g
  Filled 2019-09-04: qty 100

## 2019-09-04 NOTE — Progress Notes (Signed)
6 Days Post-Op  Subjective: CC: Appears frustrated. Wants to drink liquids. Having some "numbing" epigastric pain this morning. No abdominal pain yesterday. 4 bloody bm's in the last 24 hours. Nurse reports bright red with some small clots. Patient denies n/v.   Objective: Vital signs in last 24 hours: Temp:  [97.6 F (36.4 C)-98.5 F (36.9 C)] 98 F (36.7 C) (03/29 0414) Pulse Rate:  [90-109] 90 (03/29 0414) Resp:  [10-20] 10 (03/29 0414) BP: (88-126)/(55-73) 126/59 (03/29 0414) SpO2:  [96 %-100 %] 100 % (03/29 0414) Weight:  [86.3 kg] 86.3 kg (03/29 0500) Last BM Date: 09/03/19  Intake/Output from previous day: 03/28 0701 - 03/29 0700 In: 168 [P.O.:168] Out: -  Intake/Output this shift: No intake/output data recorded.  PE: Gen:  Alert, NAD, pleasant Card: Mild tachycardia with reg rhythm. HR 100-105 on monitor.   Pulm:  CTAB, no W/R/R, effort normal Abd: Soft, ND, mild tenderness of the epigastrium, +BS, no HSM Ext: Fistula with good thrill in left arm. Mild LE edema b/l Psych: A&Ox3  Skin: Some yellowing of the skin. No rashes noted, warm and dry  Lab Results:  Recent Labs    09/03/19 0250 09/03/19 0250 09/03/19 0727 09/03/19 1100 09/03/19 2223 09/04/19 0209  WBC 7.6  --  7.6  --   --   --   HGB 9.1*   < > 9.9*   < > 9.0* 8.4*  HCT 27.8*   < > 30.9*   < > 27.4* 26.0*  PLT 144*  --  159  --   --   --    < > = values in this interval not displayed.   BMET Recent Labs    09/02/19 0444 09/03/19 0727  NA 138 137  K 3.5 3.6  CL 103 101  CO2 24 25  GLUCOSE 88 84  BUN <5* 13  CREATININE 2.70* 3.87*  CALCIUM 7.2* 7.5*   PT/INR Recent Labs    09/03/19 0727  LABPROT 16.3*  INR 1.3*   CMP     Component Value Date/Time   NA 137 09/03/2019 0727   K 3.6 09/03/2019 0727   CL 101 09/03/2019 0727   CO2 25 09/03/2019 0727   GLUCOSE 84 09/03/2019 0727   BUN 13 09/03/2019 0727   CREATININE 3.87 (H) 09/03/2019 0727   CALCIUM 7.5 (L) 09/03/2019 0727     CALCIUM 7.4 (L) 09/12/2008 0615   PROT 4.3 (L) 08/26/2019 1247   ALBUMIN 1.6 (L) 08/30/2019 0418   AST 36 08/30/2019 1247   ALT 17 08/16/2019 1247   ALKPHOS 50 09/05/2019 1247   BILITOT 1.4 (H) 08/31/2019 1247   GFRNONAA 11 (L) 09/03/2019 0727   GFRAA 13 (L) 09/03/2019 0727   Lipase     Component Value Date/Time   LIPASE 43 07/19/2018 2330       Studies/Results: NM GI Blood Loss  Result Date: 09/03/2019 CLINICAL DATA:  GI bleed. EXAM: NUCLEAR MEDICINE GASTROINTESTINAL BLEEDING SCAN TECHNIQUE: Sequential abdominal images were obtained following intravenous administration of Tc-82m labeled red blood cells. RADIOPHARMACEUTICALS:  24.5 mCi Tc-31m pertechnetate in-vitro labeled red cells. COMPARISON:  Recent CT angiography and IR embolization of the hepatic flexure. FINDINGS: Imaging obtained over the course of 2 hours. There is radiotracer accumulation in the right upper quadrant in the region of the hepatic flexure that courses to the midline during first hour, appears at image 38/60. There is some to and fro motion of radiotracer activity. This radiotracer persists in the central  abdomen in the region of the transverse colon, however does travel to the left upper quadrant on second hour imaging. There is some increased tracer activity in the second hour. There is mild patient motion artifact. IMPRESSION: GI bleeding study positive for GI bleed in the right upper quadrant, likely hepatic flexure of the colon. This is in same area of that seen on prior abdominal CTA and recently embolized. These results will be called to the ordering clinician or representative by the Radiologist Assistant, and communication documented in the PACS or [employer]. Electronically Signed   By: Keith Rake M.D.   On: 09/03/2019 01:34    Anti-infectives: Anti-infectives (From admission, onward)   None       Assessment/Plan ESRD - dialysis on MWF History of CHF HTN CAD History of  stroke DM2 Deconditioned Hx Covid about 8 weeks ago Protein Calorie Malnutrition - Per-Alb 8.6  Lower GI bleed ABL Anemia  - S/p Colonoscopy 3/23 which showed gastric polyps and erosive gastropathy, 6polypectomies and one rectal ulcer with recurrent GIbleed,mostlikely diverticular hemorrhage - CTA3/25showed active bleeding at hepatic flexure - S/p Angio w/ coil embolization of an accessory branch of the middle colic artery on 5/52 by IR - Continues to have evidence of slow bleed - hgb9.9>9.5>8.4. BP stable - NM GI Bleed scan showed GI bleed in the right upper quadrant, likely hepatic flexure of the colon - Continue to trend hgb. Per IR note, "if bleeding continues and she does not proceed to surgery, we could consider another arteriogram to see if there is a potential branch that we could embolize without high risk of colonic infarction." She may need right hemicolectomy but would like to avoid if possible given she is very high risk for surgery and potential post op complications given underlying co-morbidities and nutritional status (pre-alb 8.6).              FEN - NPO VTE - SCDs ID - None    LOS: 8 days    Jillyn Ledger , [facility] 09/04/2019, 8:13 AM Please see Amion for pager number during day hours 7:00am-4:30pm

## 2019-09-04 NOTE — Progress Notes (Signed)
[facility] Progress Note   Subjective: Seen in room. Feels "terrible" this am. Nauseated, mouth dry, wants to drink. Continued bloody BMs, but mild abd pain. For dialysis today.   Objective Vitals:   09/03/19 2315 09/03/19 2330 09/04/19 0414 09/04/19 0500  BP: 108/73  (!) 126/59   Pulse: (!) 108 (!) 109 90   Resp: 13 16 10    Temp: 98.3 F (36.8 C)  98 F (36.7 C)   TempSrc: Oral  Oral   SpO2: 100% 99% 100%   Weight:    86.3 kg  Height:        Weight change: -4.6 kg   Additional Objective Labs: Basic Metabolic Panel: Recent Labs  Lab 08/22/2019 1608 08/20/2019 1608 08/30/19 0418 08/31/19 0141 09/01/19 0158 09/02/19 0444 09/03/19 0727  NA 136   < > 138   < > 136 138 137  K 4.1   < > 3.7   < > 4.5 3.5 3.6  CL 100   < > 103   < > 101 103 101  CO2 25   < > 23   < > 23 24 25   GLUCOSE 86   < > 105*   < > 85 88 84  BUN 12   < > 14   < > 12 <5* 13  CREATININE 3.33*   < > 4.53*   < > 3.77* 2.70* 3.87*  CALCIUM 6.7*   < > 7.0*   < > 7.0* 7.2* 7.5*  PHOS 1.8*  --  1.9*  --   --   --   --    < > = values in this interval not displayed.   CBC: Recent Labs  Lab 09/01/19 2026 09/01/19 2026 09/02/19 0444 09/02/19 0444 09/02/19 1235 09/02/19 1434 09/03/19 0250 09/03/19 0250 09/03/19 0727 09/03/19 1100 09/03/19 1812 09/03/19 2223 09/04/19 0209  WBC 5.6   < > 6.3   < > 7.0  --  7.6  --  7.6  --   --   --   --   HGB 6.6*   < > 7.8*   < > 8.6*   < > 9.1*   < > 9.9*   < > 9.5* 9.0* 8.4*  HCT 20.4*   < > 24.5*   < > 26.6*   < > 27.8*   < > 30.9*   < > 29.4* 27.4* 26.0*  MCV 99.5  --  94.6  --  94.3  --  94.6  --  96.3  --   --   --   --   PLT 114*   < > 124*   < > 139*  --  144*  --  159  --   --   --   --    < > = values in this interval not displayed.   Blood Culture    Component Value Date/Time   SDES URINE, CLEAN CATCH 01/04/2018 1524   SPECREQUEST  01/04/2018 1524    NONE Performed at [facility] [address] [place], [place] [address]     CULT MULTIPLE SPECIES PRESENT, SUGGEST RECOLLECTION (A) 01/04/2018 1524   REPTSTATUS 01/05/2018 FINAL 01/04/2018 1524     Physical Exam General: Obese female in bed, NAD  Heart: Tachy, systolic murmur  Lungs: Clear bilaterally  Abdomen: obese, soft diffuse R sided tenderness  Extremities: Non pitting edema bilaterally  Dialysis Access: LUE AVF +bruit   Medications: . sodium chloride    . sodium  chloride    . tranexamic acid     . sodium chloride   Intravenous Once  . calcitRIOL  1 mcg Oral Q M,W,F-HD  . Chlorhexidine Gluconate Cloth  6 each Topical Q0600  . darbepoetin (ARANESP) injection - DIALYSIS  100 mcg Intravenous Q Wed-HD  . feeding supplement (NEPRO CARB STEADY)  237 mL Oral BID BM  . feeding supplement (PRO-STAT SUGAR FREE 64)  30 mL Oral BID  . insulin aspart  0-6 Units Subcutaneous TID WC  . levothyroxine  50 mcg Oral QAC breakfast  . multivitamin  1 tablet Oral QHS  . pantoprazole  40 mg Oral Q0600  . phytonadione  10 mg Oral Daily    Dialysis Orders: [facility] MWF 3.5 hr EDW 85 400/A 1.5 3 K 2.25 Ca 36 degrees profile 4 var Na 148 linear left upper AVF heparin 4000 with 1500 mid tmt Mircera 100 q 2 wks -last had 60 on 3/10 calcitriol 1 Binders: fosrenol 2 gm ac  Assessment/Plan: 1. ABLA 2/2 recurrent diverticular GI bleeding - s/p colonoscopy -->6 polyps removed/non bleeding rectal ulcer.  S/p 5 units prbcs so far. Bleeding recurred and now s/p IR embolization.  Bleeding scan 3/28  + active bleed RUQ near hepatic flexure - same area of prior embolization. Still having ongoing bloody stools -->>GI/CCS following - may need hemicolectomy.  2. ESRD- HD MWF. No heparin on HD. Next HD Monday.  3. AVF issues -recent bleeding issues with access - no heparin HD. Last f'gram 05/2018. AF have been variable without intervention over the last year. Arm remains swollen but AVF functional - defer evaluation for now - given ongoing bleeding issues 4. Hypotension/volume-  BPlow/stable. UF as tolerated. Using alb, prn to support BP 5. Anemia Last transfused 3/27. Hgb 9.1>9.9>9.5>8.4 this am.  Continue Aranesp 161mcg q Wed. 6. Metabolic bone disease-Phos low without binders/Corr Ca ok.  On Calcitriol for Ca support.  corrected Ca ok since alb is so low 7. Severe malnutrition- Alb < 2.  Continue pro-stat + nepro. 8. CAD - Coreg on hold d/t hypotension  9. DM - Insulin per primary  10. GOC - for d/c to SNF when medically stable -  Lynnda Child PA-C [facility] Pager [phone] 09/04/2019,8:18 AM  LOS: 8 days

## 2019-09-04 NOTE — Progress Notes (Signed)
Physical Therapy Treatment Patient Details Name: Roberta Bryant MRN: [id number] DOB: September 22, 1950 Today's Date: 09/04/2019    History of Present Illness Pt is a 69 y/o female admitted secondary to acute blood loss anemia, likely from GI source. Pt is s/p endoscopy and s/p colonoscopy with cold snare polypectomy X6. PMH includes ESRD on HD MWF and GERD.     PT Comments    Pt received in bed, tearful and stating she is so tired.  Agreeable to exercises in bed. During session, pt very tearful, stating "I don't want to go to dialysis. I am just so tired. I want to go home." Asked pt if she would like to speak to someone from spiritual care. Pt nodded her head yes. Comfort provided to pt. RN notified of pt's comments and request. Spiritual care consult placed. Per MD progress notes, pt has new GI bleed, with intervention still being determined.   Follow Up Recommendations  SNF;Supervision/Assistance - 24 hour     Equipment Recommendations  Hospital bed;Other (comment)(hoyer lift, hoyer pad)    Recommendations for Other Services       Precautions / Restrictions Precautions Precautions: Fall    Mobility  Bed Mobility Overal bed mobility: Needs Assistance Bed Mobility: Rolling Rolling: Max assist         General bed mobility comments: rolling for repositioning  Transfers                    Ambulation/Gait                 Stairs             Wheelchair Mobility    Modified Rankin (Stroke Patients Only)       Balance                                            Cognition Arousal/Alertness: Awake/alert Behavior During Therapy: WFL for tasks assessed/performed Overall Cognitive Status: Impaired/Different from baseline Area of Impairment: Orientation;Attention;Following commands;Awareness;Problem solving                 Orientation Level: Disoriented to;Time Current Attention Level: Sustained   Following Commands:  Follows one step commands with increased time   Awareness: Emergent Problem Solving: Difficulty sequencing;Decreased initiation;Requires verbal cues;Slow processing General Comments: Pt very tearful during session, stating "I'm so tired. I don't want to go to dialysis. I just want to go home."      Exercises General Exercises - Lower Extremity Ankle Circles/Pumps: AROM;20 reps;Both;Supine Heel Slides: AAROM;Right;Left;10 reps;Supine Hip ABduction/ADduction: AAROM;Right;Left;10 reps;Supine Straight Leg Raises: AAROM;Right;Left;10 reps;Supine    General Comments        Pertinent Vitals/Pain Pain Assessment: Faces Faces Pain Scale: Hurts little more Pain Location: hands Pain Descriptors / Indicators: Grimacing;Guarding;Tender Pain Intervention(s): Monitored during session;Limited activity within patient's tolerance    Home Living                      Prior Function            PT Goals (current goals can now be found in the care plan section) Acute Rehab PT Goals Patient Stated Goal: "I just want to go home." Progress towards PT goals: Not progressing toward goals - comment(medical complications)    Frequency    Min 2X/week      PT Plan Current plan remains appropriate  Co-evaluation              AM-PAC PT "6 Clicks" Mobility   Outcome Measure  Help needed turning from your back to your side while in a flat bed without using bedrails?: Total Help needed moving from lying on your back to sitting on the side of a flat bed without using bedrails?: Total Help needed moving to and from a bed to a chair (including a wheelchair)?: Total Help needed standing up from a chair using your arms (e.g., wheelchair or bedside chair)?: Total Help needed to walk in hospital room?: Total Help needed climbing 3-5 steps with a railing? : Total 6 Click Score: 6    End of Session   Activity Tolerance: Patient limited by pain;Patient limited by fatigue Patient left:  in bed;with call bell/phone within reach Nurse Communication: Mobility status;Other (comment)(request for spiritual care consult) PT Visit Diagnosis: Unsteadiness on feet (R26.81);Muscle weakness (generalized) (M62.81);Difficulty in walking, not elsewhere classified (R26.2)     Time: 1050-1106 PT Time Calculation (min) (ACUTE ONLY): 16 min  Charges:  $Therapeutic Exercise: 8-22 mins                     Lorrin Goodell, PT  Office # [phone] Pager [phone]    Lorriane Shire 09/04/2019, 11:20 AM

## 2019-09-04 NOTE — Progress Notes (Signed)
PROGRESS NOTE    AILED DEFIBAUGH  EYC:[id number] DOB: [birth year] DOA: 09/02/2019 PCP: [name], MD   Brief Narrative: 18 YOF ESRD on HD, hypertension, CAD, diabetes, hypothyroidism presented with acute blood loss anemia in the setting of GI bleed, was seen by GI, nephrology.  Underwent EGD 3/22 nondiagnostic and colonoscopy 3/23-that showed left colon diverticulosis no stigmata of bleeding.Continue to have rectal bleeding underwent CTA 3/25 with active bleeding in the hepatic flexure and had embolization done.  Patient continues to have intermittent rectal bleeding, needing blood transfusions.  Due to ongoing bleeding underwent bleeding scan that was positive for bleeding at the hepatic flexure at previous embolization site.  General surgery was consulted after IR recommendation.  Patient was transferred to progressive unit 3/28.  Subjective: ALERT,AWAKE, NOT IN DISTRESS. Hemoglobin downtrending 8.4 g this morning.  4 episodes of BM charted 3/28. Rn reprots had small  BM with clots and red bleed this am not charted yet. Low blood sugar 67 last night. Hemodynamically stable.  Assessment & Plan:  Recurrent Diverticular GI bleeding: s/p EGD 3/22 and colonoscopy 3/23: with mild nonbleeding erosive gastropathy, peptic duodenitis, 6 polyps removed from right colon and left colon and had left colon diverticulosis no stigmata of bleeding. Pathology shows tubular adenomas. S/p CTA that showed active bleeding at hepatic flexure, and underwent arteriogram and embolization 3/25.Continues to have bleeding recurrently- NM Bleeding scan 3/27+ hepatic flexure bleeding again- gi, ir, surgery following. Discussed with Dr. Doyne Keel from [facility] need for icu for now.  Tranexamic acid ordered but not given due to hypotension 3/28.  Surgery has evaluated and no plans for colectomy as patient is high risk, IR also on board but concerned about further embolization due to risk of ischemia.  Acute blood loss anemia  from gi bleeding and anemia of chronic kidney disease: s/p 4 unit PRBC so far-last one 3/27. Continue Aranesp, no iron deficiency per study 3/22.  Check H&H every 4 hour and transfuse as needed. Recent Labs  Lab 09/03/19 1100 09/03/19 1418 09/03/19 1812 09/03/19 2223 09/04/19 0209  HGB 9.9* 9.3* 9.5* 9.0* 8.4*  HCT 29.9* 28.5* 29.4* 27.4* 26.0*   Bleeding from dialysis fistula-improved.  ESRD on HD MWF -last HD 3/26.Appreciate nephrology input.  LUE aVF pulsatile with LUE larger than RUE ?CV Stenosis- fistulogram once stable per nephrology.    Thrombocytopenia:stable, monitor Recent Labs  Lab 09/01/19 2026 09/02/19 0444 09/02/19 1235 09/03/19 0250 09/03/19 0727  PLT 114* 124* 139* 144* 159   Elevated INR 2.0 on admission, improved to 1.3.  Continue daily vitamin K. Recent Labs  Lab 08/14/2019 1206 08/30/19 0418 09/01/19 0158 09/03/19 0727  INR 1.5* 1.5* 1.4* 1.3*   Hypertension: Needed IV fluid boluses and blood transfusions. BP soft. Monitor  Metabolic bone disease:continue calcitriol.  Anxiety/depression:Mood is stable resume home Klonopin as tolerated  CAD:No chest pain.Monitor.Home meds aspirin Coreg on hold in the setting of GI bleed, soft blood pressure.     Diabetes mellitus:Hemoglobin A1c 4.9.Continue sliding scale insulin.Hold her home Januvia. Recent Labs  Lab 09/03/19 0753 09/03/19 1116 09/03/19 1737 09/03/19 2127 09/03/19 2150  GLUCAP 79 77 73 67* 72   Hypothyroidism: Continue home Synthroid  Deconditioning: Continue PT OT likely need a skilled nursing facility placement as suggested by PT.   Intermittent mild confusion/acute metabolic encephalopathy in the setting of GI bleed.  Monitor and keep on fall precaution.  Family reports patient had intermittent wheezing before also  Goals of care:patient is a full code, given difficult  situation complex comorbidities will benefit with palliative care following along.  Nutrition: Monitor blood sugar,    Diet Order            Diet NPO time specified  Diet effective midnight              Nutrition Problem: Inadequate oral intake Etiology: altered GI function Signs/Symptoms: meal completion < 25% Interventions: MVI, Prostat, Nepro shake Body mass index is 39.76 kg/m.   DVT prophylaxis:SCD-holding chemical prophylaxis due to anemia Code Status:FULL Family Communication: plan of care discussed with patient at bedside. Son at bedside was updated. Disposition Plan: Patient is from:home Anticipated Disposition: to  SNF Barriers to discharge or conditions that needs to be met prior to discharge: Patient admitted with anemia 4.9 underwent EGD and colonoscopy, with ongoing rectal bleeding underwent IR embolization of the active bleeding and hepatic flexure, continues to have ongoing intermittent bleeding surgery GI and IR following. Monitor closely and low threshold for ICU transfer  Consultants: General surgery, nephrology, gastroenterology, interventional radiology, PCCM.   Procedures: 09/01/2019 EGD: A few gastric polyps biopsied: hyperplastic polyp with eroded granulation tissue and surface reactive change.  Mild, nonbleeding, erosive gastropathy biopsied: Mild reactive gastropathy with some surface hyperplastic change, no H. Pylori.  Duodenal nodule biopsied: peptic duodenitis.  Protonix 40/day in place, Prilosec 20/daily, 81 ASA/daily PTA.  .   09/01/2019 colonoscopy.  6 polyps removed located in right and left colon.  Left colon diverticulosis.  Solitary rectal ulcer, no stigmata of bleeding. Pathology shows tubular adenomas, no HGD.  Ulcer not biopsied.   3/25 IR arteriogram with bleeding in the hepatic flexure and s/p embolization  Microbiology:see note  Medications: Scheduled Meds: . sodium chloride   Intravenous Once  . calcitRIOL  1 mcg Oral Q M,W,F-HD  . Chlorhexidine Gluconate Cloth  6 each Topical Q0600  . darbepoetin (ARANESP) injection - DIALYSIS  100 mcg Intravenous Q  Wed-HD  . feeding supplement (NEPRO CARB STEADY)  237 mL Oral BID BM  . feeding supplement (PRO-STAT SUGAR FREE 64)  30 mL Oral BID  . insulin aspart  0-6 Units Subcutaneous TID WC  . levothyroxine  50 mcg Oral QAC breakfast  . multivitamin  1 tablet Oral QHS  . pantoprazole  40 mg Oral Q0600  . phytonadione  10 mg Oral Daily   Continuous Infusions: . sodium chloride    . sodium chloride    . tranexamic acid      Antimicrobials: Anti-infectives (From admission, onward)   None       Objective: Vitals: Today's Vitals   09/03/19 2315 09/03/19 2330 09/04/19 0414 09/04/19 0500  BP: 108/73  (!) 126/59   Pulse: (!) 108 (!) 109 90   Resp: _0 Temp: 98.3 F (36.8 C)  98 F (36.7 C)   TempSrc: Oral  Oral   SpO2: 100% 99% 100%   Weight:    86.3 kg  Height:      PainSc: 0-No pain       Intake/Output Summary (Last 24 hours) at 09/04/2019 0912 Last data filed at 09/03/2019 1800 Gross per 24 hour  Intake 168 ml  Output --  Net 168 ml   Filed Weights   09/01/19 1805 09/03/19 0441 09/04/19 0500  Weight: 83.8 kg 90.9 kg 86.3 kg   Weight change: -4.6 kg   Intake/Output from previous day: 03/28 0701 - 03/29 0700 In: 168 [P.O.:168] Out: -  Intake/Output this shift: No intake/output data recorded.  Examination:  General exam: AAOX3, not in distress, obese. HEENT:Oral mucosa moist, Ear/Nose WNL grossly,dentition normal. Respiratory system: bilaterally clear, non tender. Cardiovascular system: S1 & S2 +, regular, No JVD. Gastrointestinal system: Abdomen soft, obese, NT,ND, BS+. Nervous System:Alert, awake, moving extremities and grossly nonfocal Extremities: No edema, distal peripheral pulses palpable.  Skin: No rashes,no icterus. MSK: Normal muscle bulk,tone, power  Data Reviewed: I have personally reviewed following labs and imaging studies CBC: Recent Labs  Lab 09/01/19 2026 09/01/19 2026 09/02/19 0444 09/02/19 0444 09/02/19 1235 09/02/19 1434  09/03/19 0250 09/03/19 0250 09/03/19 0727 09/03/19 0727 09/03/19 1100 09/03/19 1418 09/03/19 1812 09/03/19 2223 09/04/19 0209  WBC 5.6  --  6.3  --  7.0  --  7.6  --  7.6  --   --   --   --   --   --   HGB 6.6*   < > 7.8*   < > 8.6*   < > 9.1*   < > 9.9*   < > 9.9* 9.3* 9.5* 9.0* 8.4*  HCT 20.4*   < > 24.5*   < > 26.6*   < > 27.8*   < > 30.9*   < > 29.9* 28.5* 29.4* 27.4* 26.0*  MCV 99.5  --  94.6  --  94.3  --  94.6  --  96.3  --   --   --   --   --   --   PLT 114*  --  124*  --  139*  --  144*  --  159  --   --   --   --   --   --    < > = values in this interval not displayed.   Basic Metabolic Panel: Recent Labs  Lab 09/02/2019 1608 08/24/2019 1608 08/30/19 0418 08/31/19 0141 09/01/19 0158 09/02/19 0444 09/03/19 0727  NA 136   < > 138 136 136 138 137  K 4.1   < > 3.7 3.9 4.5 3.5 3.6  CL 100   < > 103 100 101 103 101  CO2 25   < > _0 GLUCOSE 86   < > 105* 114* 85 88 84  BUN 12   < > 14 7* 12 <5* 13  CREATININE 3.33*   < > 4.53* 2.88* 3.77* 2.70* 3.87*  CALCIUM 6.7*   < > 7.0* 7.1* 7.0* 7.2* 7.5*  MG  --   --  1.6*  --   --   --   --   PHOS 1.8*  --  1.9*  --   --   --   --    < > = values in this interval not displayed.   GFR: Estimated Creatinine Clearance: 12.8 mL/min (A) (by C-G formula based on SCr of 3.87 mg/dL (H)). Liver Function Tests: Recent Labs  Lab 08/19/2019 1608 08/30/19 0418  ALBUMIN 1.6* 1.6*   No results for input(s): LIPASE, AMYLASE in the last 168 hours. No results for input(s): AMMONIA in the last 168 hours. Coagulation Profile: Recent Labs  Lab 09/01/2019 1206 08/30/19 0418 09/01/19 0158 09/03/19 0727  INR 1.5* 1.5* 1.4* 1.3*   Cardiac Enzymes: No results for input(s): CKTOTAL, CKMB, CKMBINDEX, TROPONINI in the last 168 hours. BNP (last 3 results) No results for input(s): PROBNP in the last 8760 hours. HbA1C: No results for input(s): HGBA1C in the last 72 hours. CBG: Recent Labs  Lab 09/03/19 0753 09/03/19 1116  09/03/19 1737 09/03/19 2127 09/03/19 2150  GLUCAP 79 77 73 67* 72   Lipid Profile: No results for input(s): CHOL, HDL, LDLCALC, TRIG, CHOLHDL, LDLDIRECT in the last 72 hours. Thyroid Function Tests: No results for input(s): TSH, T4TOTAL, FREET4, T3FREE, THYROIDAB in the last 72 hours. Anemia Panel: No results for input(s): VITAMINB12, FOLATE, FERRITIN, TIBC, IRON, RETICCTPCT in the last 72 hours. Sepsis Labs: No results for input(s): PROCALCITON, LATICACIDVEN in the last 168 hours.  Recent Results (from the past 240 hour(s))  Respiratory Panel by RT PCR (Flu A&B, Covid) - Nasopharyngeal Swab     Status: None   Collection Time: 08/31/2019  3:17 PM   Specimen: Nasopharyngeal Swab  Result Value Ref Range Status   SARS Coronavirus 2 by RT PCR NEGATIVE NEGATIVE Final    Comment: (NOTE) SARS-CoV-2 target nucleic acids are NOT DETECTED. The SARS-CoV-2 RNA is generally detectable in upper respiratoy specimens during the acute phase of infection. The lowest concentration of SARS-CoV-2 viral copies this assay can detect is 131 copies/mL. A negative result does not preclude SARS-Cov-2 infection and should not be used as the sole basis for treatment or other patient management decisions. A negative result may occur with  improper specimen collection/handling, submission of specimen other than nasopharyngeal swab, presence of viral mutation(s) within the areas targeted by this assay, and inadequate number of viral copies (<131 copies/mL). A negative result must be combined with clinical observations, patient history, and epidemiological information. The expected result is Negative. Fact Sheet for Patients:  PinkCheek.be Fact Sheet for Healthcare Providers:  GravelBags.it This test is not yet ap proved or cleared by the [place] FDA and  has been authorized for detection and/or diagnosis of SARS-CoV-2 by FDA under an Emergency Use  Authorization (EUA). This EUA will remain  in effect (meaning this test can be used) for the duration of the COVID-19 declaration under Section 564(b)(1) of the Act, 21 U.S.C. section 360bbb-3(b)(1), unless the authorization is terminated or revoked sooner.    Influenza A by PCR NEGATIVE NEGATIVE Final   Influenza B by PCR NEGATIVE NEGATIVE Final    Comment: (NOTE) The Xpert Xpress SARS-CoV-2/FLU/RSV assay is intended as an aid in  the diagnosis of influenza from Nasopharyngeal swab specimens and  should not be used as a sole basis for treatment. Nasal washings and  aspirates are unacceptable for Xpert Xpress SARS-CoV-2/FLU/RSV  testing. Fact Sheet for Patients: PinkCheek.be Fact Sheet for Healthcare Providers: GravelBags.it This test is not yet approved or cleared by the [place] FDA and  has been authorized for detection and/or diagnosis of SARS-CoV-2 by  FDA under an Emergency Use Authorization (EUA). This EUA will remain  in effect (meaning this test can be used) for the duration of the  Covid-19 declaration under Section 564(b)(1) of the Act, 21  U.S.C. section 360bbb-3(b)(1), unless the authorization is  terminated or revoked. Performed at [facility], [place] [address] [place], [place] [address]   MRSA PCR Screening     Status: None   Collection Time: 08/26/2019  9:21 PM   Specimen: Nasopharyngeal  Result Value Ref Range Status   MRSA by PCR NEGATIVE NEGATIVE Final    Comment:        The GeneXpert MRSA Assay (FDA approved for NASAL specimens only), is one component of a comprehensive MRSA colonization surveillance program. It is not intended to diagnose MRSA infection nor to guide or monitor treatment for MRSA infections. Performed at [facility], [place] [address] [place], [place] [address]  Radiology Studies: NM GI Blood Loss  Result Date: 09/03/2019 CLINICAL DATA:  GI bleed. EXAM:  NUCLEAR MEDICINE GASTROINTESTINAL BLEEDING SCAN TECHNIQUE: Sequential abdominal images were obtained following intravenous administration of Tc-39mlabeled red blood cells. RADIOPHARMACEUTICALS:  24.5 mCi Tc-944mertechnetate in-vitro labeled red cells. COMPARISON:  Recent CT angiography and IR embolization of the hepatic flexure. FINDINGS: Imaging obtained over the course of 2 hours. There is radiotracer accumulation in the right upper quadrant in the region of the hepatic flexure that courses to the midline during first hour, appears at image 38/60. There is some to and fro motion of radiotracer activity. This radiotracer persists in the central abdomen in the region of the transverse colon, however does travel to the left upper quadrant on second hour imaging. There is some increased tracer activity in the second hour. There is mild patient motion artifact. IMPRESSION: GI bleeding study positive for GI bleed in the right upper quadrant, likely hepatic flexure of the colon. This is in same area of that seen on prior abdominal CTA and recently embolized. These results will be called to the ordering clinician or representative by the Radiologist Assistant, and communication documented in the PACS or ClFrontier Oil CorporationElectronically Signed   By: MeKeith Rake.D.   On: 09/03/2019 01:34     LOS: 8 days   Time spent: More than 50% of that time was spent in counseling and/or coordination of care.  RaAntonieta PertMD [facility]  09/04/2019, 9:12 AM

## 2019-09-04 NOTE — Progress Notes (Signed)
Paged D Arne Cleveland, MD from Interventional Radiology.  After reviewing chart, he advised he did not believe that any further interventions were going to be completed.  He was not reported off from his partners that one would be completed today. I inquired on fact that the Pt was made NPO for the day pending possible procedures for today.  He advised from IR stand point, she could have a meal if Primary wanted to order her one.  I paged Primary team and advised of the above

## 2019-09-04 NOTE — Progress Notes (Addendum)
Progress Note   Subjective  Chief Complaint: Lower GI bleed  Today, the patient is asleep with a blanket over her head.  She tells me just to take a "45 and end it".  Explains that she just passed some more blood and is at least 2 or 3 late yesterday afternoon.  Continues to deny abdominal pain.  There is maroon blood from the rectum.    Objective   Vital signs in last 24 hours: Temp:  [98 F (36.7 C)-98.5 F (36.9 C)] 98 F (36.7 C) (03/29 0414) Pulse Rate:  [90-109] 90 (03/29 0414) Resp:  [10-20] 10 (03/29 0414) BP: (88-126)/(55-73) 126/59 (03/29 0414) SpO2:  [96 %-100 %] 100 % (03/29 0414) Weight:  [86.3 kg] 86.3 kg (03/29 0500) Last BM Date: 09/03/19 General:    Ill appearing female in NAD Heart:  Regular rate and rhythm; no murmurs Lungs: Respirations even and unlabored, lungs CTA bilaterally Abdomen:  Soft, nontender and nondistended. Mid line lower abdominal scar Normal bowel sounds.1-2 Tablespoons of clotted maroon blood near rectum Extremities:  Without edema. Neurologic:  Alert and oriented,  grossly normal neurologically. Psych:  Cooperative. Normal mood and affect.  Intake/Output from previous day: 03/28 0701 - 03/29 0700 In: 168 [P.O.:168] Out: -   Lab Results: Recent Labs    09/02/19 1235 09/02/19 1434 09/03/19 0250 09/03/19 0250 09/03/19 0727 09/03/19 1100 09/03/19 1812 09/03/19 2223 09/04/19 0209  WBC 7.0  --  7.6  --  7.6  --   --   --   --   HGB 8.6*   < > 9.1*   < > 9.9*   < > 9.5* 9.0* 8.4*  HCT 26.6*   < > 27.8*   < > 30.9*   < > 29.4* 27.4* 26.0*  PLT 139*  --  144*  --  159  --   --   --   --    < > = values in this interval not displayed.   BMET Recent Labs    09/02/19 0444 09/03/19 0727  NA 138 137  K 3.5 3.6  CL 103 101  CO2 24 25  GLUCOSE 88 84  BUN <5* 13  CREATININE 2.70* 3.87*  CALCIUM 7.2* 7.5*   PT/INR Recent Labs    09/03/19 0727 09/04/19 0851  LABPROT 16.3* 15.8*  INR 1.3* 1.3*    Studies/Results: NM  GI Blood Loss  Result Date: 09/03/2019 CLINICAL DATA:  GI bleed. EXAM: NUCLEAR MEDICINE GASTROINTESTINAL BLEEDING SCAN TECHNIQUE: Sequential abdominal images were obtained following intravenous administration of Tc-5m labeled red blood cells. RADIOPHARMACEUTICALS:  24.5 mCi Tc-73m pertechnetate in-vitro labeled red cells. COMPARISON:  Recent CT angiography and IR embolization of the hepatic flexure. FINDINGS: Imaging obtained over the course of 2 hours. There is radiotracer accumulation in the right upper quadrant in the region of the hepatic flexure that courses to the midline during first hour, appears at image 38/60. There is some to and fro motion of radiotracer activity. This radiotracer persists in the central abdomen in the region of the transverse colon, however does travel to the left upper quadrant on second hour imaging. There is some increased tracer activity in the second hour. There is mild patient motion artifact. IMPRESSION: GI bleeding study positive for GI bleed in the right upper quadrant, likely hepatic flexure of the colon. This is in same area of that seen on prior abdominal CTA and recently embolized. These results will be called to the ordering clinician or representative  by the Radiologist Assistant, and communication documented in the PACS or [employer]. Electronically Signed   By: Keith Rake M.D.   On: 09/03/2019 01:34   Tagged RBC scan 09/02/2019: GI bleeding study positive for GI bleed in the right upper quadrant, likely hepatic flexure of the colon. This is in same area of that seen on prior abdominal CTA and recently embolized  09/04/2019 EGD: Afew gastric polyps biopsied: hyperplastic polyp with eroded granulation tissue and surface reactive change. Mild, nonbleeding, erosive gastropathy biopsied:Mild reactive gastropathy with some surface hyperplastic change, no H. Pylori.Duodenal nodule biopsied:peptic duodenitis.  .  08/24/2019 colonoscopy: 6 polyps  removed located in right and left colon. Left colon diverticulosis. Solitary rectal ulcer, no stigmata of bleeding. Pathology shows tubular adenomas,noHGD. Ulcer not biopsied.  3/25IR arteriogram:  bleeding in the hepatic flexure ands/pembolization  3/28 bleeding scan: Positive for GI bleed in the right upper quadrant, likely hepatic flexure   Assessment / Plan:   Assessment: 1.  Lower GI bleed: Status post EGD and colonoscopy 3/23 which showed gastric polyps and erosive gastropathy, 6 polypectomies and 1 rectal ulcer with recurrent GI bleed, most likely diverticular hemorrhage, CTA 3/25 showed active bleeding at hepatic flexure, IR consulted, status post arteriogram and embolization of an accessory branch of the middle colic artery 3/83, continued with slow rectal bleeding, hemoglobin down to 6.6 on 3/26 for which she received 1/4 unit of PRBCs, persistent rectal bleeding over the weekend, tagged RBC scan 3/27+ for GI bleed in the right upper quadrant, likely hepatic flexure, fifth unit of PRBCs transfused 3/27, hemoglobin today 9.0--> 8.4 overnight, 4 bloody BMs in the past 24 hours, surgery has evaluated with no plans for colectomy as patient is high risk, IR is also on board but have concerns about further embolization increase in the risk for right colon infarct 2.  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  Coagulopathy, improved with vitamin K: Current INR 1.3 4.  ESRD on HD  Plan: 1.  Continue monitoring H&H, transfuse for hemoglobin less than 7 2.  Continue other supportive measures 3.  Per IR they could consider another embolization but patient is at high risk for colon infarction 4.  Please await further recommendations from Dr. Carlean Purl later today  Thank you for your kind consultation.    LOS: 8 days   Levin Erp  09/04/2019, 9:53 AM    [facility] Attending   I have taken an interval history, reviewed the chart and examined the patient. I agree  with the Advanced Practitioner's note, impression and recommendations.   Persistent LGI bleeding  Per RN the patient was on IR schedule today though no notes from them  Was made NPO - RN to check with IR to see if plan for procedure tonight - if not then can have clears  Consider PLT transfusion  Gatha Mayer, MD, [facility] 09/04/2019 5:58 PM

## 2019-09-04 NOTE — Plan of Care (Signed)
  Problem: Education: Goal: Knowledge of General Education information will improve Description: Including pain rating scale, medication(s)/side effects and non-pharmacologic comfort measures 09/04/2019 1420 by Shanon Ace, RN Outcome: Progressing 09/04/2019 1419 by Shanon Ace, RN Outcome: Progressing   Problem: Health Behavior/Discharge Planning: Goal: Ability to manage health-related needs will improve 09/04/2019 1420 by Shanon Ace, RN Outcome: Progressing 09/04/2019 1419 by Shanon Ace, RN Outcome: Progressing   Problem: Clinical Measurements: Goal: Ability to maintain clinical measurements within normal limits will improve 09/04/2019 1420 by Shanon Ace, RN Outcome: Progressing 09/04/2019 1419 by Shanon Ace, RN Outcome: Progressing Goal: Will remain free from infection 09/04/2019 1420 by Shanon Ace, RN Outcome: Progressing 09/04/2019 1419 by Shanon Ace, RN Outcome: Progressing Goal: Diagnostic test results will improve 09/04/2019 1420 by Shanon Ace, RN Outcome: Progressing 09/04/2019 1419 by Shanon Ace, RN Outcome: Progressing Goal: Respiratory complications will improve 09/04/2019 1420 by Shanon Ace, RN Outcome: Progressing 09/04/2019 1419 by Shanon Ace, RN Outcome: Progressing Goal: Cardiovascular complication will be avoided 09/04/2019 1420 by Shanon Ace, RN Outcome: Progressing 09/04/2019 1419 by Shanon Ace, RN Outcome: Progressing   Problem: Activity: Goal: Risk for activity intolerance will decrease 09/04/2019 1420 by Shanon Ace, RN Outcome: Progressing 09/04/2019 1419 by Shanon Ace, RN Outcome: Progressing   Problem: Nutrition: Goal: Adequate nutrition will be maintained 09/04/2019 1420 by Shanon Ace, RN Outcome: Progressing 09/04/2019 1419 by Shanon Ace, RN Outcome: Progressing   Problem: Coping: Goal: Level of anxiety will decrease 09/04/2019 1420 by Shanon Ace, RN Outcome: Progressing 09/04/2019 1419 by Shanon Ace, RN Outcome: Progressing   Problem: Elimination: Goal: Will not experience complications related to bowel motility 09/04/2019 1420 by Shanon Ace, RN Outcome: Progressing 09/04/2019 1419 by Shanon Ace, RN Outcome: Progressing Goal: Will not experience complications related to urinary retention 09/04/2019 1420 by Shanon Ace, RN Outcome: Progressing 09/04/2019 1419 by Shanon Ace, RN Outcome: Progressing   Problem: Pain Managment: Goal: General experience of comfort will improve 09/04/2019 1420 by Shanon Ace, RN Outcome: Progressing 09/04/2019 1419 by Shanon Ace, RN Outcome: Progressing   Problem: Safety: Goal: Ability to remain free from injury will improve 09/04/2019 1420 by Shanon Ace, RN Outcome: Progressing 09/04/2019 1419 by Shanon Ace, RN Outcome: Progressing   Problem: Skin Integrity: Goal: Risk for impaired skin integrity will decrease 09/04/2019 1420 by Shanon Ace, RN Outcome: Progressing 09/04/2019 1419 by Shanon Ace, RN Outcome: Progressing

## 2019-09-05 ENCOUNTER — Inpatient Hospital Stay (HOSPITAL_COMMUNITY): Payer: Medicare Other

## 2019-09-05 ENCOUNTER — Encounter (HOSPITAL_COMMUNITY): Payer: Self-pay | Admitting: Internal Medicine

## 2019-09-05 ENCOUNTER — Inpatient Hospital Stay (HOSPITAL_COMMUNITY): Payer: Medicare Other | Admitting: Certified Registered"

## 2019-09-05 ENCOUNTER — Encounter (HOSPITAL_COMMUNITY): Admission: EM | Disposition: E | Payer: Self-pay | Source: Home / Self Care | Attending: Internal Medicine

## 2019-09-05 DIAGNOSIS — Z7189 Other specified counseling: Secondary | ICD-10-CM

## 2019-09-05 HISTORY — PX: PARTIAL COLECTOMY: SHX5273

## 2019-09-05 LAB — HEMOGLOBIN AND HEMATOCRIT, BLOOD
HCT: 31.1 % — ABNORMAL LOW (ref 36.0–46.0)
HCT: 29.6 % — ABNORMAL LOW (ref 36.0–46.0)
Hemoglobin: 10.4 g/dL — ABNORMAL LOW (ref 12.0–15.0)
Hemoglobin: 10 g/dL — ABNORMAL LOW (ref 12.0–15.0)

## 2019-09-05 LAB — GLUCOSE, CAPILLARY
Glucose-Capillary: 83 mg/dL (ref 70–99)
Glucose-Capillary: 79 mg/dL (ref 70–99)
Glucose-Capillary: 97 mg/dL (ref 70–99)
Glucose-Capillary: 102 mg/dL — ABNORMAL HIGH (ref 70–99)
Glucose-Capillary: 98 mg/dL (ref 70–99)
Glucose-Capillary: 104 mg/dL — ABNORMAL HIGH (ref 70–99)
Glucose-Capillary: 103 mg/dL — ABNORMAL HIGH (ref 70–99)
Glucose-Capillary: 119 mg/dL — ABNORMAL HIGH (ref 70–99)

## 2019-09-05 LAB — CBC
HCT: 21.3 % — ABNORMAL LOW (ref 36.0–46.0)
Hemoglobin: 6.8 g/dL — CL (ref 12.0–15.0)
MCH: 30.9 pg (ref 26.0–34.0)
MCHC: 31.9 g/dL (ref 30.0–36.0)
MCV: 96.8 fL (ref 80.0–100.0)
Platelets: 129 10*3/uL — ABNORMAL LOW (ref 150–400)
RBC: 2.2 MIL/uL — ABNORMAL LOW (ref 3.87–5.11)
RDW: 22.7 % — ABNORMAL HIGH (ref 11.5–15.5)
WBC: 7.9 10*3/uL (ref 4.0–10.5)
nRBC: 0 % (ref 0.0–0.2)

## 2019-09-05 LAB — COMPREHENSIVE METABOLIC PANEL
ALT: 22 U/L (ref 0–44)
AST: 41 U/L (ref 15–41)
Albumin: 2.2 g/dL — ABNORMAL LOW (ref 3.5–5.0)
Alkaline Phosphatase: 52 U/L (ref 38–126)
Anion gap: 13 (ref 5–15)
BUN: 8 mg/dL (ref 8–23)
CO2: 22 mmol/L (ref 22–32)
Calcium: 7.1 mg/dL — ABNORMAL LOW (ref 8.9–10.3)
Chloride: 101 mmol/L (ref 98–111)
Creatinine, Ser: 3.48 mg/dL — ABNORMAL HIGH (ref 0.44–1.00)
GFR calc Af Amer: 15 mL/min — ABNORMAL LOW (ref >60)
GFR calc non Af Amer: 13 mL/min — ABNORMAL LOW (ref >60)
Glucose, Bld: 116 mg/dL — ABNORMAL HIGH (ref 70–99)
Potassium: 3.4 mmol/L — ABNORMAL LOW (ref 3.5–5.1)
Sodium: 136 mmol/L (ref 135–145)
Total Bilirubin: 1.4 mg/dL — ABNORMAL HIGH (ref 0.3–1.2)
Total Protein: 4.3 g/dL — ABNORMAL LOW (ref 6.5–8.1)

## 2019-09-05 LAB — POCT I-STAT EG7
Acid-Base Excess: 4 mmol/L — ABNORMAL HIGH (ref 0.0–2.0)
Bicarbonate: 27.7 mmol/L (ref 20.0–28.0)
Calcium, Ion: 1.01 mmol/L — ABNORMAL LOW (ref 1.15–1.40)
HCT: 22 % — ABNORMAL LOW (ref 36.0–46.0)
Hemoglobin: 7.5 g/dL — ABNORMAL LOW (ref 12.0–15.0)
O2 Saturation: 94 %
Potassium: 3.5 mmol/L (ref 3.5–5.1)
Sodium: 140 mmol/L (ref 135–145)
TCO2: 29 mmol/L (ref 22–32)
pCO2, Ven: 37.4 mmHg — ABNORMAL LOW (ref 44.0–60.0)
pH, Ven: 7.477 — ABNORMAL HIGH (ref 7.250–7.430)
pO2, Ven: 65 mmHg — ABNORMAL HIGH (ref 32.0–45.0)

## 2019-09-05 LAB — PROTIME-INR
INR: 1.6 — ABNORMAL HIGH (ref 0.8–1.2)
Prothrombin Time: 19.3 seconds — ABNORMAL HIGH (ref 11.4–15.2)

## 2019-09-05 LAB — PREPARE RBC (CROSSMATCH)

## 2019-09-05 SURGERY — COLECTOMY, PARTIAL
Anesthesia: General

## 2019-09-05 MED ORDER — ACETAMINOPHEN 325 MG PO TABS: 650.0000 mg | ORAL_TABLET | Freq: Four times a day (QID) | ORAL | Status: DC | PRN

## 2019-09-05 MED ORDER — MIDAZOLAM HCL 2 MG/2ML IJ SOLN
INTRAMUSCULAR | Status: AC
  Filled 2019-09-05: qty 2

## 2019-09-05 MED ORDER — MIDAZOLAM HCL 5 MG/5ML IJ SOLN
INTRAMUSCULAR | Status: DC | PRN
  Administered 2019-09-05: 1 mg via INTRAVENOUS

## 2019-09-05 MED ORDER — SODIUM CHLORIDE 0.9% IV SOLUTION: Freq: Once | INTRAVENOUS | Status: DC

## 2019-09-05 MED ORDER — PROPOFOL 10 MG/ML IV BOLUS
INTRAVENOUS | Status: DC | PRN
  Administered 2019-09-05: 100 mg via INTRAVENOUS

## 2019-09-05 MED ORDER — ACETAMINOPHEN 325 MG PO TABS
650.0000 mg | ORAL_TABLET | Freq: Four times a day (QID) | ORAL | Status: DC
  Administered 2019-09-05 – 2019-09-08 (×7): 650 mg via ORAL
  Filled 2019-09-05 (×11): qty 2

## 2019-09-05 MED ORDER — SODIUM CHLORIDE 0.9 % IV SOLN
2.0000 g | Freq: Once | INTRAVENOUS | Status: AC
  Administered 2019-09-05: 2 g via INTRAVENOUS
  Filled 2019-09-05: qty 2

## 2019-09-05 MED ORDER — ALBUMIN HUMAN 5 % IV SOLN: INTRAVENOUS | Status: DC | PRN

## 2019-09-05 MED ORDER — MORPHINE SULFATE (PF) 2 MG/ML IV SOLN
1.0000 mg | INTRAVENOUS | Status: DC | PRN
  Administered 2019-09-06: 1 mg via INTRAVENOUS
  Administered 2019-09-06: 2 mg via INTRAVENOUS
  Filled 2019-09-05 (×3): qty 1

## 2019-09-05 MED ORDER — 0.9 % SODIUM CHLORIDE (POUR BTL) OPTIME
TOPICAL | Status: DC | PRN
  Administered 2019-09-05: 16:00:00 3000 mL
  Administered 2019-09-05: 2000 mL

## 2019-09-05 MED ORDER — SODIUM CHLORIDE 0.9 % IV SOLN: 10.0000 mL/h | Freq: Once | INTRAVENOUS | Status: DC

## 2019-09-05 MED ORDER — SUCCINYLCHOLINE CHLORIDE 200 MG/10ML IV SOSY
PREFILLED_SYRINGE | INTRAVENOUS | Status: DC | PRN
  Administered 2019-09-05: 100 mg via INTRAVENOUS

## 2019-09-05 MED ORDER — PHENYLEPHRINE HCL-NACL 10-0.9 MG/250ML-% IV SOLN
INTRAVENOUS | Status: DC | PRN
  Administered 2019-09-05: 50 ug/min via INTRAVENOUS

## 2019-09-05 MED ORDER — ONDANSETRON HCL 4 MG/2ML IJ SOLN
INTRAMUSCULAR | Status: DC | PRN
  Administered 2019-09-05: 4 mg via INTRAVENOUS

## 2019-09-05 MED ORDER — FENTANYL CITRATE (PF) 100 MCG/2ML IJ SOLN
INTRAMUSCULAR | Status: DC | PRN
  Administered 2019-09-05 (×2): 75 ug via INTRAVENOUS
  Administered 2019-09-05: 50 ug via INTRAVENOUS

## 2019-09-05 MED ORDER — DEXAMETHASONE SODIUM PHOSPHATE 10 MG/ML IJ SOLN
INTRAMUSCULAR | Status: DC | PRN
  Administered 2019-09-05: 5 mg via INTRAVENOUS

## 2019-09-05 MED ORDER — METHOCARBAMOL 1000 MG/10ML IJ SOLN
500.0000 mg | Freq: Four times a day (QID) | INTRAVENOUS | Status: DC | PRN
  Filled 2019-09-05: qty 5

## 2019-09-05 MED ORDER — SODIUM CHLORIDE 0.9 % IV SOLN: INTRAVENOUS | Status: DC

## 2019-09-05 MED ORDER — FENTANYL CITRATE (PF) 250 MCG/5ML IJ SOLN
INTRAMUSCULAR | Status: AC
  Filled 2019-09-05: qty 5

## 2019-09-05 MED ORDER — SUGAMMADEX SODIUM 200 MG/2ML IV SOLN
INTRAVENOUS | Status: DC | PRN
  Administered 2019-09-05 (×2): 200 mg via INTRAVENOUS

## 2019-09-05 MED ORDER — LIDOCAINE 2% (20 MG/ML) 5 ML SYRINGE
INTRAMUSCULAR | Status: DC | PRN
  Administered 2019-09-05: 100 mg via INTRAVENOUS

## 2019-09-05 MED ORDER — EPHEDRINE SULFATE 50 MG/ML IJ SOLN
INTRAMUSCULAR | Status: DC | PRN
  Administered 2019-09-05: 10 mg via INTRAVENOUS

## 2019-09-05 MED ORDER — OXYCODONE HCL 5 MG PO TABS
5.0000 mg | ORAL_TABLET | ORAL | Status: DC | PRN
  Administered 2019-09-05 – 2019-09-06 (×2): 5 mg via ORAL
  Filled 2019-09-05 (×2): qty 1

## 2019-09-05 MED ORDER — ROCURONIUM BROMIDE 10 MG/ML (PF) SYRINGE
PREFILLED_SYRINGE | INTRAVENOUS | Status: DC | PRN
  Administered 2019-09-05: 50 mg via INTRAVENOUS

## 2019-09-05 SURGICAL SUPPLY — 42 items
APPLICATOR COTTON TIP 6 STRL (MISCELLANEOUS) ×2 IMPLANT
APPLICATOR COTTON TIP 6IN STRL (MISCELLANEOUS) ×6
BLADE CLIPPER SURG (BLADE) IMPLANT
CANISTER SUCT 3000ML PPV (MISCELLANEOUS) IMPLANT
COVER SURGICAL LIGHT HANDLE (MISCELLANEOUS) ×3 IMPLANT
COVER WAND RF STERILE (DRAPES) IMPLANT
DRAPE INCISE IOBAN 66X45 STRL (DRAPES) IMPLANT
DRSG OPSITE POSTOP 4X10 (GAUZE/BANDAGES/DRESSINGS) IMPLANT
DRSG OPSITE POSTOP 4X8 (GAUZE/BANDAGES/DRESSINGS) ×3 IMPLANT
ELECT CAUTERY BLADE 6.4 (BLADE) ×6 IMPLANT
ELECT REM PT RETURN 9FT ADLT (ELECTROSURGICAL) ×3
ELECTRODE REM PT RTRN 9FT ADLT (ELECTROSURGICAL) ×1 IMPLANT
GLOVE SURG SIGNA 7.5 PF LTX (GLOVE) ×6 IMPLANT
GOWN STRL REUS W/ TWL LRG LVL3 (GOWN DISPOSABLE) ×4 IMPLANT
GOWN STRL REUS W/ TWL XL LVL3 (GOWN DISPOSABLE) ×2 IMPLANT
GOWN STRL REUS W/TWL LRG LVL3 (GOWN DISPOSABLE) ×12
GOWN STRL REUS W/TWL XL LVL3 (GOWN DISPOSABLE) ×6
KIT SIGMOIDOSCOPE (SET/KITS/TRAYS/PACK) IMPLANT
KIT TURNOVER KIT B (KITS) ×3 IMPLANT
LIGASURE IMPACT 36 18CM CVD LR (INSTRUMENTS) IMPLANT
NS IRRIG 1000ML POUR BTL (IV SOLUTION) ×6 IMPLANT
PACK COLON (CUSTOM PROCEDURE TRAY) ×3 IMPLANT
PAD ARMBOARD 7.5X6 YLW CONV (MISCELLANEOUS) ×3 IMPLANT
PENCIL BUTTON HOLSTER BLD 10FT (ELECTRODE) ×3 IMPLANT
RELOAD PROXIMATE 75MM BLUE (ENDOMECHANICALS) ×3 IMPLANT
SPECIMEN JAR LARGE (MISCELLANEOUS) IMPLANT
SPONGE LAP 18X18 RF (DISPOSABLE) ×12 IMPLANT
STAPLER GUN LINEAR PROX 60 (STAPLE) ×3 IMPLANT
STAPLER PROXIMATE 75MM BLUE (STAPLE) ×6 IMPLANT
STAPLER VISISTAT 35W (STAPLE) IMPLANT
SURGILUBE 2OZ TUBE FLIPTOP (MISCELLANEOUS) IMPLANT
SUT PROLENE 2 0 CT2 30 (SUTURE) IMPLANT
SUT PROLENE 2 0 KS (SUTURE) IMPLANT
SUT SILK 2 0 SH CR/8 (SUTURE) ×3 IMPLANT
SUT SILK 2 0 TIES 10X30 (SUTURE) ×3 IMPLANT
SUT SILK 3 0 SH CR/8 (SUTURE) ×3 IMPLANT
SUT SILK 3 0 TIES 10X30 (SUTURE) ×3 IMPLANT
SUT VIC AB 3-0 SH 18 (SUTURE) IMPLANT
TRAY FOLEY MTR SLVR 14FR STAT (SET/KITS/TRAYS/PACK) IMPLANT
TUBE CONNECTING 12'X1/4 (SUCTIONS) ×2
TUBE CONNECTING 12X1/4 (SUCTIONS) ×4 IMPLANT
UNDERPAD 30X30 (UNDERPADS AND DIAPERS) IMPLANT

## 2019-09-05 NOTE — Progress Notes (Signed)
[facility] Progress Note   Subjective: Seen in room. Completed HD yesterday with 1.2L removed. Continued bloody BMs this am. To get transfusion prbcs today.     Objective Vitals:   09/04/19 2132 09/04/19 2330 09/04/19 2340 08/13/2019 0424  BP: (!) 111/57 (!) 106/39 (!) 106/53   Pulse: (!) 104 (!) 104 99   Resp: 16 20 (!) 23   Temp: 98 F (36.7 C) 98.2 F (36.8 C)    TempSrc: Oral Oral    SpO2: 100% 99% 97%   Weight:    84.7 kg  Height:        Weight change: -0.3 kg   Additional Objective Labs: Basic Metabolic Panel: Recent Labs  Lab 08/30/19 0418 08/31/19 0141 09/02/19 0444 09/03/19 0727 09/04/19 0851  NA 138   < > 138 137 136  K 3.7   < > 3.5 3.6 4.5  CL 103   < > 103 101 100  CO2 23   < > 24 25 21*  GLUCOSE 105*   < > 88 84 85  BUN 14   < > <5* 13 18  CREATININE 4.53*   < > 2.70* 3.87* 4.81*  CALCIUM 7.0*   < > 7.2* 7.5* 7.6*  PHOS 1.9*  --   --   --   --    < > = values in this interval not displayed.   CBC: Recent Labs  Lab 09/02/19 0444 09/02/19 0444 09/02/19 1235 09/02/19 1434 09/03/19 0250 09/03/19 0250 09/03/19 0727 09/03/19 1100 09/04/19 1813 09/04/19 2326 09/02/2019 0725  WBC 6.3   < > 7.0   < > 7.6  --  7.6  --   --   --  7.9  HGB 7.8*   < > 8.6*   < > 9.1*   < > 9.9*   < > 7.6* 7.2* 6.8*  HCT 24.5*   < > 26.6*   < > 27.8*   < > 30.9*   < > 23.5* 22.0* 21.3*  MCV 94.6  --  94.3  --  94.6  --  96.3  --   --   --  96.8  PLT 124*   < > 139*   < > 144*  --  159  --   --   --  129*   < > = values in this interval not displayed.   Blood Culture    Component Value Date/Time   SDES URINE, CLEAN CATCH 01/04/2018 1524   SPECREQUEST  01/04/2018 1524    NONE Performed at [facility] [address], [place], [place] [address]    CULT MULTIPLE SPECIES PRESENT, SUGGEST RECOLLECTION (A) 01/04/2018 1524   REPTSTATUS 01/05/2018 FINAL 01/04/2018 1524     Physical Exam General: Obese female in bed, NAD  Heart: Tachy, systolic  murmur  Lungs: Clear bilaterally  Abdomen: obese, soft diffuse R sided tenderness  Extremities: Non pitting edema bilaterally  Dialysis Access: LUE AVF +bruit   Medications: . sodium chloride    . sodium chloride     . sodium chloride   Intravenous Once  . sodium chloride   Intravenous Once  . calcitRIOL  1 mcg Oral Q M,W,F-HD  . Chlorhexidine Gluconate Cloth  6 each Topical Q0600  . darbepoetin (ARANESP) injection - DIALYSIS  100 mcg Intravenous Q Wed-HD  . feeding supplement (NEPRO CARB STEADY)  237 mL Oral BID BM  . feeding supplement (PRO-STAT SUGAR FREE 64)  30 mL Oral BID  .  insulin aspart  0-6 Units Subcutaneous TID WC  . levothyroxine  50 mcg Oral QAC breakfast  . multivitamin  1 tablet Oral QHS  . pantoprazole  40 mg Oral Q0600  . phytonadione  10 mg Oral Daily    Dialysis Orders: [facility] MWF 3.5 hr EDW 85 400/A 1.5 3 K 2.25 Ca 36 degrees profile 4 var Na 148 linear left upper AVF heparin 4000 with 1500 mid tmt Mircera 100 q 2 wks -last had 60 on 3/10 calcitriol 1 Binders: fosrenol 2 gm ac  Assessment/Plan: 1. ABLA 2/2 recurrent diverticular GI bleeding - s/p colonoscopy -->6 polyps removed/non bleeding rectal ulcer. Bleeding recurred and now s/p IR embolization.  Bleeding scan 3/28  + active bleed RUQ near hepatic flexure - same area of prior embolization. Still having ongoing bloody stools -->>GI/CCS following - may need hemicolectomy.  S/p 5 units prbcs so far. For another unit today.  2. ESRD- HD MWF. No heparin on HD. Next HD 3/31 3. AVF issues -recent bleeding issues with access - no heparin HD. Last f'gram 05/2018. AF have been variable without intervention over the last year. Arm remains swollen but AVF functional - defer evaluation for now - given ongoing bleeding issues 4. Hypotension/volume- BPlow/stable. UF as tolerated. Using alb, prn to support BP. Is at her outpatient dry weight.  5. Anemia  Hgb trending down d/t ongoing GI bleed as above. Hgb 6.8 this  am. For another unit prbcs this am.   Continue Aranesp 185mcg q Wed. 6. Metabolic bone disease-Phos low without binders/Corr Ca ok.  On Calcitriol for Ca support.  corrected Ca ok since alb is so low 7. Severe malnutrition- Alb < 2.  Continue pro-stat + nepro. 8. CAD - Coreg on hold d/t hypotension  9. DM - Insulin per primary  10. GOC - for d/c to SNF when medically stable -  Lynnda Child PA-C [facility] Pager [phone] 08/20/2019,8:44 AM  LOS: 9 days

## 2019-09-05 NOTE — Progress Notes (Signed)
Occupational Therapy Treatment Patient Details Name: Roberta Bryant MRN: [id number] DOB: December 16, 1950 Today's Date: 08/21/2019    History of present illness Pt is a 69 y/o female admitted secondary to acute blood loss anemia, likely from GI source. Pt is s/p endoscopy and s/p colonoscopy with cold snare polypectomy X6. PMH includes ESRD on HD MWF and GERD.    OT comments  Pt progressing to EOB ADL tasks x10 mins. Pt maxA for bed mobility. Pt limited by decreased strength, mobility and ability to follow multistep commands. Pt requiring assist for processing and sequencing through task. Pt's VSS on EOB supine 93/74 (82); sitting EOB 90/72 (79), 100 BPM throughout ADL task. Pt would benefit from continued OT skilled services. OT following acutely.   Follow Up Recommendations  SNF;Supervision/Assistance - 24 hour    Equipment Recommendations  Other (comment)(defer to post acute facility)    Recommendations for Other Services      Precautions / Restrictions Precautions Precautions: Fall Restrictions Weight Bearing Restrictions: No       Mobility Bed Mobility Overal bed mobility: Needs Assistance Bed Mobility: Rolling Rolling: Max assist         General bed mobility comments: rolling for repositioning  Transfers Overall transfer level: Needs assistance Equipment used: None Transfers: Sit to/from Stand           General transfer comment: deferred; low hgb today    Balance Overall balance assessment: Needs assistance Sitting-balance support: No upper extremity supported;Feet supported Sitting balance-Leahy Scale: Fair       Standing balance-Leahy Scale: Zero                             ADL either performed or assessed with clinical judgement   ADL Overall ADL's : Needs assistance/impaired     Grooming: Minimal assistance;Sitting;Cueing for sequencing Grooming Details (indicate cue type and reason): Pt unable to perform without assist from OT for  donning toothpaste. RUE painful today and unable to lift to brush without assist. Poor problem solving.                             Functional mobility during ADLs: Maximal assistance General ADL Comments: Pt limited by decreased strength, mobility and ability to follow multistep commands. Pt requiring assist for processing and sequencing through task.     Vision   Vision Assessment?: No apparent visual deficits   Perception     Praxis      Cognition Arousal/Alertness: Awake/alert Behavior During Therapy: WFL for tasks assessed/performed Overall Cognitive Status: Impaired/Different from baseline Area of Impairment: Awareness;Problem solving;Memory                     Memory: Decreased short-term memory     Awareness: Emergent Problem Solving: Difficulty sequencing;Decreased initiation;Requires verbal cues;Slow processing General Comments: Pt tearful and asking when her son was coming. When he did arrive, pt said "why weren't you here earlier?" Pt slow to process and requiring assist for sequencing stating "you are rushing me" during grooming tasks.        Exercises     Shoulder Instructions       General Comments Pt's VSS on EOB supine 93/74 (82); sitting EOB 90/72 (79), 100 BPM throughout ADL task.    Pertinent Vitals/ Pain       Pain Assessment: No/denies pain  Home Living  Prior Functioning/Environment              Frequency  Min 2X/week        Progress Toward Goals  OT Goals(current goals can now be found in the care plan section)  Progress towards OT goals: Progressing toward goals  Acute Rehab OT Goals Patient Stated Goal: "I just want to go home." OT Goal Formulation: With patient/family Time For Goal Achievement: 2019-10-03 Potential to Achieve Goals: Fair ADL Goals Pt Will Perform Grooming: with min assist;sitting Pt Will Perform Upper Body Bathing: with min  assist;sitting Pt Will Transfer to Toilet: with mod assist;with +2 assist;stand pivot transfer;bedside commode Pt/caregiver will Perform Home Exercise Program: Increased strength;Both right and left upper extremity;With written HEP provided;Increased ROM Additional ADL Goal #1: Patient will complete bed mobility with mod assist +2 as precursor to ADLs.  Plan Discharge plan remains appropriate    Co-evaluation                 AM-PAC OT "6 Clicks" Daily Activity     Outcome Measure   Help from another person eating meals?: A Lot Help from another person taking care of personal grooming?: A Little Help from another person toileting, which includes using toliet, bedpan, or urinal?: Total Help from another person bathing (including washing, rinsing, drying)?: A Lot Help from another person to put on and taking off regular upper body clothing?: A Lot Help from another person to put on and taking off regular lower body clothing?: Total 6 Click Score: 11    End of Session    OT Visit Diagnosis: Other abnormalities of gait and mobility (R26.89);Muscle weakness (generalized) (M62.81);History of falling (Z91.81);Other symptoms and signs involving cognitive function   Activity Tolerance Patient tolerated treatment well   Patient Left in bed;with call bell/phone within reach;with bed alarm set;with family/visitor present   Nurse Communication Mobility status        Time: 2992-4268 OT Time Calculation (min): 32 min  Charges: OT General Charges $OT Visit: 1 Visit OT Treatments $Self Care/Home Management : 23-37 mins  Jefferey Pica, OTR/L Acute Rehabilitation Services Pager: [phone] Office: [phone]   Ladonya Jerkins C 08/25/2019, 5:17 PM

## 2019-09-05 NOTE — Progress Notes (Signed)
Consulted to place second site for surgery.  Ultrasound used with no usable sites identified.  Patient only able to have PIV in right arm, now has blood infiltrate in upper arm area and PIV in [facility]  Please consider alternate IV site for use in OR and post surgery.

## 2019-09-05 NOTE — Progress Notes (Signed)
   Patient Name: Roberta Bryant Date of Encounter: 08/10/2019, 10:23 AM    Subjective  Continues to bleed - hematochezia   Objective  BP 90/66   Pulse 99   Temp 97.6 F (36.4 C) (Oral)   Resp 20   Ht 4\' 10"  (1.473 m)   Wt 84.7 kg   SpO2 97%   BMI 39.03 kg/m   CBC Latest Ref Rng & Units 08/24/2019 09/04/2019 09/04/2019  WBC 4.0 - 10.5 K/uL 7.9 - -  Hemoglobin 12.0 - 15.0 g/dL 6.8(LL) 7.2(L) 7.6(L)  Hematocrit 36.0 - 46.0 % 21.3(L) 22.0(L) 23.5(L)  Platelets 150 - 400 K/uL 129(L) - -      Assessment and Plan  Lower GI bleed at hepatic flexure - persistent despite IR embolization Her anatomy will not allow safe embolization again (risk of infarct too high)  Seen w/ Dr. Ninfa Linden - plan is for surgery later today which should be definitive treatment    Gatha Mayer, MD, [facility] 08/15/2019 10:23 AM

## 2019-09-05 NOTE — Progress Notes (Signed)
Chaplain engaged in initial visit with Roberta Bryant and her son Roberta Bryant.  Roberta Bryant stated that she has been here 73 days, while her son noted that she came here on the 21st and it has only been 9 days today.  Roberta Bryant expressed, "You try laying here 58 days and see how you feel" to explain her irritability and "saltiness" towards others.  Chaplain affirmed that no matter how long she has been here, health challenges and battles can take there toll on a person.  Chaplain found out from [place] that mom rarely gets out the house and is mostly confined to her bed.  Chaplain noted that it is not abnormal to experience a change in mood or attitude when you are limited to a bed and room. Chaplain assesses that her current medical conditions and the inability to be connected to the outside world have taken a oll on Roberta Bryant.  Roberta Bryant is self-aware and recognizes that she talks to others in a way that she should not. Chaplain verbalized that self-awareness to [place].     Roberta Bryant also discussed her faith during the visit.  She said that she had not been to church since being divorced.  Roberta Bryant asked the question about why God would allow her to go through her current health condition.  She believed that she must have done something wrong in life.  Chaplain worked Financial trader to rearrange the thinking that she had committed some act that has left her in this current position. Roberta Bryant consistently stated things like, "I am a B*tch."  Chaplain worked to affirm how hard it is to battle with your health and the impact that can have.  Chaplain offered the ministries of presence, empathic listening, and prayer.    Chaplain will continue to follow-up.

## 2019-09-05 NOTE — Plan of Care (Signed)

## 2019-09-05 NOTE — Progress Notes (Addendum)
Patient in bed, was yelling out. This nurse asked patient what was wrong. Patient says that she has to poop, and to hurry up. This nurse call for assistance. Patient becomes irritable and patient had already had incontinent episode in bed. X 2 assist. Patient had small, liquidy BM. Barrier cream applied to buttocks. Assist x 2 with movement in bed.   [phone]- Paged [facility] about patient's hgb last night 7.2. Has had several bloody stools during the night including clots. Several lab sticks to obtain blood work. Neprology at bedside. Made aware but told to call team and let them know.

## 2019-09-05 NOTE — Anesthesia Procedure Notes (Addendum)
Procedure Name: Intubation Date/Time: 08/27/2019 3:27 PM Performed by: Imagene Riches, CRNA Pre-anesthesia Checklist: Patient identified, Emergency Drugs available, Suction available and Patient being monitored Patient Re-evaluated:Patient Re-evaluated prior to induction Oxygen Delivery Method: Circle System Utilized Preoxygenation: Pre-oxygenation with 100% oxygen Induction Type: IV induction Laryngoscope Size: Miller and 2 Grade View: Grade I Tube type: Oral Tube size: 7.0 mm Number of attempts: 1 Airway Equipment and Method: Stylet and Oral airway Placement Confirmation: ETT inserted through vocal cords under direct vision,  positive ETCO2 and breath sounds checked- equal and bilateral Secured at: 21 cm Tube secured with: Tape Dental Injury: Teeth and Oropharynx as per pre-operative assessment

## 2019-09-05 NOTE — Consult Note (Signed)
Consultation Note Date: 08/29/2019   Patient Name: Roberta Bryant  DOB: 13-Aug-1950  MRN: [id number]  Age / Sex: 69 y.o., female  PCP: [name], MD Referring Physician: Antonieta Pert, MD  Reason for Consultation: Establishing goals of care and Psychosocial/spiritual support  HPI/Patient Profile: 69 y.o. female  admitted on 08/23/2019 with end-stage renal disease on dialysis, CHF, hypertension, hyperlipidemia, hypothyroidism, diabetes, several hospitalizations in February and March 2021 for COVID-19 and related complication who presents with bloody stools.  She is known to Dr. Lyndel Safe from prior GI evaluation including colonoscopy in February 2012 which was negative.  EGD in May 2014 revealing hyperplastic gastric polyps, normal small bowel biopsies.  Dr. Lyndel Safe last saw her by televisit in December 2020 with intermittent nausea and vomiting complaint as well as alternating bowel habits.  At that time he changed omeprazole to Protonix 40 twice daily added Zofran for nausea.  They considered upper endoscopy after cardiac clearance if symptoms worsen.  She was to follow-up in about 12 weeks.  Patient stated that she started bleeding from her dialysis fistula on Friday.  She reports that the blood was brisk and difficult to control.  Eventually this did abate.  And then yesterday she developed dark bloody stools.  She reported feeling very weak and tired.  Diffuse abdominal discomfort at home.  Denies abdominal pain now.  Still feels weak.  Appetite has been very decreased but no nausea or vomiting.  She did complete dialysis session on Friday.  Consideration for hemicolectomy  Patient faces treatment option decisions, advanced directive decisions and anticipatory care needs.   Clinical Assessment and Goals of Care:    This NP Wadie Lessen reviewed medical records, received report from team, assessed the  patient and then meet at the patient's bedside  to discuss diagnosis, prognosis, GOC, EOL wishes disposition and options.  Concept of Palliative Care was discussed  A discussion was had today regarding advanced directives.  Concepts specific to code status, artifical feeding and hydration, continued IV antibiotics and rehospitalization was had.  The difference between a aggressive medical intervention path  and a palliative comfort care path for this patient at this time was had.  Values and goals of care important to patient and family were attempted to be elicited.  Created space and opportunity for patient to explore thoughts and feelings regarding her current medical situation.  She recognizes that her body continues to decline secondary to multiple comorbidities.  She recognizes that her care needs in the home are increasing and more more difficult for family to provide.  Discussed with patient the importance of continued conversation with her family and the  medical providers regarding overall plan of care and treatment options,  ensuring decisions are within the context of the patients values and GOCs.  I then spoke to her son Roberta Bryant with patient's permission and had conversation regarding above concepts.  He to verbalizes an understanding of the patient's serious medical situation and increasing needs.  However ultimately family hope to continue to  care for the patient at home as long as it is safe and doable.   Questions and concerns addressed.   Family encouraged to call with questions or concerns.    PMT will continue to support holistically.    No documented healthcare power of attorney or advanced directive.  Patient currently has medical decision capacity for herself and if there is a time that she cannot make her own decisions both of her sons will work as a unit for the patient's best interest.  Encourage patient to complete documentation while here in the hospital with  the assistance of spiritual care.    SUMMARY OF RECOMMENDATIONS    Code Status/Advance Care Planning:  Full Code  Encouraged patient/family to consider DNR/DNI status understanding evidenced based poor outcomes in similar hospitalized patient, as the cause of arrest is likely associated with advanced chronic illness rather than an easily reversible acute cardio-pulmonary event.  Patient is open to all offered and available medical interventions to prolong life.  Palliative Prophylaxis:   Aspiration, Bowel Regimen, Delirium Protocol, Frequent Pain Assessment and Oral Care  Additional Recommendations (Limitations, Scope, Preferences):  Full Scope Treatment  Psycho-social/Spiritual:   Desire for further Chaplaincy support:yes   Prognosis:   Unable to determine  Discharge Planning: To Be Determined      Primary Diagnoses: Present on Admission: **None**   I have reviewed the medical record, interviewed the patient and family, and examined the patient. The following aspects are pertinent.  Past Medical History:  Diagnosis Date  . Anemia   . Aortic stenosis   . CHF (congestive heart failure) ([place])   . Childhood asthma   . Chronic upper back pain   . Coronary artery disease    Stent to left main coronary artery second of August 2017  . Decreased hearing   . ESRD (end stage renal disease) on dialysis [facility])    "MWF; [facility] in [place]" (01/06/2018)  . Gait abnormality 08/28/2016  . GERD (gastroesophageal reflux disease)   . Headache    "a few times/week" (10/30/2014)  . Headache syndrome 01/08/2017  . Heart murmur   . History of blood transfusion 1996   "related to menses"  . Hyperlipidemia   . Hypertension   . Hypothyroidism   . IBS (irritable bowel syndrome)   . Myocardial infarction ([place]) 2004  . Osteoarthritis   . Pneumonia    "years ago"  . Rotator cuff arthropathy of right shoulder   . Spondylosis of cervical spine 06/16/2017  . Stroke ([place]) 07/2016    mini stroke , right side of body weak-   . Type II diabetes mellitus ([place])    Social History   Socioeconomic History  . Marital status: Divorced    Spouse name: Not on file  . Number of children: 4  . Years of education: Some college  . Highest education level: Not on file  Occupational History  . Occupation: Disabled  Tobacco Use  . Smoking status: Former Smoker    Years: 3.00    Types: Cigarettes  . Smokeless tobacco: Never Used  . Tobacco comment: "stopped smoking in the 1970's"  Substance and Sexual Activity  . Alcohol use: No  . Drug use: Not Currently    Types: Marijuana, Hydromorphone    Comment: as a teenager  . Sexual activity: Not Currently  Other Topics Concern  . Not on file  Social History Narrative   Lives at home with sons   Caffeine use: Coffee-decaf   Right-handed  Social Determinants of Health   Financial Resource Strain:   . Difficulty of Paying Living Expenses:   Food Insecurity:   . Worried About Charity fundraiser in the Last Year:   . Arboriculturist in the Last Year:   Transportation Needs:   . Film/video editor (Medical):   Marland Kitchen Lack of Transportation (Non-Medical):   Physical Activity:   . Days of Exercise per Week:   . Minutes of Exercise per Session:   Stress:   . Feeling of Stress :   Social Connections:   . Frequency of Communication with Friends and Family:   . Frequency of Social Gatherings with Friends and Family:   . Attends Religious Services:   . Active Member of Clubs or Organizations:   . Attends Archivist Meetings:   Marland Kitchen Marital Status:    Family History  Problem Relation Age of Onset  . Epilepsy Mother   . Diabetes Mother   . Cancer Mother   . Alcohol abuse Father    Scheduled Meds: . sodium chloride   Intravenous Once  . sodium chloride   Intravenous Once  . calcitRIOL  1 mcg Oral Q M,W,F-HD  . Chlorhexidine Gluconate Cloth  6 each Topical Q0600  . darbepoetin (ARANESP) injection - DIALYSIS  100  mcg Intravenous Q Wed-HD  . feeding supplement (NEPRO CARB STEADY)  237 mL Oral BID BM  . feeding supplement (PRO-STAT SUGAR FREE 64)  30 mL Oral BID  . insulin aspart  0-6 Units Subcutaneous TID WC  . levothyroxine  50 mcg Oral QAC breakfast  . multivitamin  1 tablet Oral QHS  . pantoprazole  40 mg Oral Q0600  . phytonadione  10 mg Oral Daily   Continuous Infusions: . sodium chloride    . sodium chloride     PRN Meds:.sodium chloride, sodium chloride, acetaminophen, HYDROcodone-acetaminophen, ondansetron (ZOFRAN) IV, technetium labeled red blood cells Medications Prior to Admission:  Prior to Admission medications   Medication Sig Start Date End Date Taking? Authorizing Provider  acetaminophen (TYLENOL) 500 MG tablet Take 500-1,000 mg by mouth every 6 (six) hours as needed (FOR PAIN.).   Yes [provider]  aspirin EC 81 MG tablet Take 81 mg by mouth daily.    Yes [provider]  carvedilol (COREG) 6.25 MG tablet Take 6.25 mg by mouth 2 (two) times daily with a meal.   Yes [provider]  clonazePAM (KLONOPIN) 0.5 MG tablet TAKE 1 TABLET BY MOUTH AT BEDTIME Patient taking differently: Take 0.5 mg by mouth at bedtime.  05/15/19  Yes Kathrynn Ducking, MD  ezetimibe (ZETIA) 10 MG tablet Take 10 mg by mouth every evening.  05/10/16  Yes [provider]  fenofibrate 160 MG tablet Take 160 mg by mouth every evening.  03/08/17  Yes [provider]  folic acid-vitamin b complex-vitamin c-selenium-zinc (DIALYVITE) 3 MG TABS tablet Take 1 tablet by mouth daily.   Yes [provider]  Lanthanum Carbonate (FOSRENOL) 1000 MG PACK Take 2,000 mg by mouth 3 (three) times daily.    Yes [provider]  levothyroxine (SYNTHROID, LEVOTHROID) 50 MCG tablet Take 50 mcg by mouth daily before breakfast.    Yes [provider]  lidocaine-prilocaine (EMLA) cream Apply 1 application topically See admin instructions. Apply a small amount to  access site 1-2 hours as needed before dialysis--cover with occlusive dressing 09/24/17  Yes [provider]  loperamide (IMODIUM A-D) 2 MG tablet Take 2-4  mg by mouth every Monday, Wednesday, and Friday with hemodialysis.    Yes [provider]  nortriptyline (PAMELOR) 10 MG capsule Take 2 capsules (20 mg total) by mouth at bedtime. 08/14/19  Yes Kathrynn Ducking, MD  omeprazole (PRILOSEC) 20 MG capsule Take 20 mg by mouth in the morning and at bedtime.   Yes [provider]  sitaGLIPtin (JANUVIA) 25 MG tablet Take 25 mg by mouth daily.   Yes [provider]  Vitamin D, Ergocalciferol, (DRISDOL) 1.25 MG (50000 UT) CAPS capsule Take 50,000 Units by mouth every 30 (thirty) days. 04/22/19  Yes [provider]  HYDROcodone-acetaminophen (NORCO/VICODIN) 5-325 MG tablet Take 1-2 tablets by mouth every 6 (six) hours as needed for severe pain. Patient not taking: Reported on 08/23/2019 03/06/19   Joy, Shawn C, PA-C  ondansetron (ZOFRAN ODT) 4 MG disintegrating tablet Take 1 tablet (4 mg total) by mouth every 8 (eight) hours as needed for nausea or vomiting. Patient not taking: Reported on 05/30/2019 05/18/19   Jackquline Denmark, MD  pantoprazole (PROTONIX) 40 MG tablet Take 1 tablet (40 mg total) by mouth 2 (two) times daily. Patient not taking: Reported on 08/23/2019 05/18/19   Jackquline Denmark, MD   Allergies  Allergen Reactions  . Sulfa Antibiotics Anaphylaxis  . Amoxicillin Hives    Has patient had a PCN reaction causing immediate rash, facial/tongue/throat swelling, SOB or lightheadedness with hypotension: No Has patient had a PCN reaction causing severe rash involving mucus membranes or skin necrosis: No Has patient had a PCN reaction that required hospitalization: No Has patient had a PCN reaction occurring within the last 10 years: Unknown If all of the above answers are "NO", then may proceed with Cephalosporin use.   Marland Kitchen Crestor [Rosuvastatin Calcium] Other  (See Comments)    Dizziness, couldn't walk  . Ibuprofen Hives  . Naldecon Senior [employer]  . Statins Hives  . Chlorphen-Phenyleph-Asa Rash  . Gabapentin Other (See Comments)    Dizzy   Review of Systems  Constitutional: Positive for fatigue.  Endocrine: Positive for cold intolerance.  Neurological: Positive for weakness.    Physical Exam Constitutional:      Appearance: She is ill-appearing.  Cardiovascular:     Rate and Rhythm: Tachycardia present.  Musculoskeletal:     Comments: Generalized weakness and muscle atrophy on all extremities  Skin:    General: Skin is warm and dry.  Neurological:     Mental Status: She is alert.     Vital Signs: BP (!) 87/72 (BP Location: Right Arm)   Pulse 99   Temp (!) 97.5 F (36.4 C) (Oral)   Resp 16   Ht 4\' 10"  (1.473 m)   Wt 84.7 kg   SpO2 97%   BMI 39.03 kg/m  Pain Scale: 0-10 POSS *See Group Information*: 1-Acceptable,Awake and alert Pain Score: 5    SpO2: SpO2: 97 % O2 Device:SpO2: 97 % O2 Flow Rate: .O2 Flow Rate (L/min): 2 L/min  IO: Intake/output summary:   Intake/Output Summary (Last 24 hours) at 08/17/2019 0950 Last data filed at 09/06/2019 0400 Gross per 24 hour  Intake 360 ml  Output 1224 ml  Net -864 ml    LBM: Last BM Date: 09/04/19 Baseline Weight: Weight: 86.4 kg Most recent weight: Weight: 84.7 kg     Palliative Assessment/Data:  30 % at best     Time In: 1000 Time Out: 1110 Time Total: 70 minutes Greater than 50%  of this time was spent counseling and  coordinating care related to the above assessment and plan.  Signed by: Wadie Lessen, NP   Please contact Palliative Medicine Team phone at [phone] for questions and concerns.  For individual provider: See Shea Evans

## 2019-09-05 NOTE — Progress Notes (Signed)
PROGRESS NOTE    Roberta Bryant  ZOX:[id number] DOB: [birth year] DOA: 09/06/2019 PCP: [name], MD   Brief Narrative: 62 YOF ESRD on HD, hypertension, CAD, diabetes, hypothyroidism presented with acute blood loss anemia in the setting of GI bleed, was seen by GI, nephrology.  Underwent EGD 3/22 nondiagnostic and colonoscopy 3/23-that showed left colon diverticulosis no stigmata of bleeding.Continue to have rectal bleeding underwent CTA 3/25 with active bleeding in the hepatic flexure and had embolization done.  Patient continues to have intermittent rectal bleeding, needing blood transfusions.  Due to ongoing bleeding underwent bleeding scan that was positive for bleeding at the hepatic flexure at previous embolization site.  General surgery was consulted after IR recommendation.  Patient was transferred to progressive unit 3/28.  Subjective: Seen this morning, son at the bedside. Multiple bowel movements overnight liquidy, bloody (TOTAL in 24 hrs BM X5 CAHRTED) Hb downtrending again 6.8-and transfusion ordered this morning and patient is getting it.  Assessment & Plan:  Recurrent Diverticular GI bleeding: s/p EGD 3/22 and colonoscopy 3/23: with mild nonbleeding erosive gastropathy, peptic duodenitis, 6 polyps removed from right colon and left colon and had left colon diverticulosis no stigmata of bleeding. Pathology shows tubular adenomas. S/p CTA that showed active bleeding at hepatic flexure, and underwent arteriogram and embolization 3/25.Continues to have bleeding recurrently- NM Bleeding scan 3/27+ hepatic flexure bleeding again- gi, ir, surgery following. Discussed with Dr. Doyne Keel from [facility] need for icu for now.  Patient continues to have ongoing bleeding, needing blood transfusion again this morning.IR feels other attempted embolizing could lead to ischemic bowel/necrosis which could necessitate emergent colectomy. Surgery has again seen the patient at this time discussed about  laparotomy and partial colectomy versus repeating a colonoscopy-patient given her complex comorbidities at high risk but at this time not much option available and patient and family along with surgical team has decided on partial colectomy.   Acute blood loss anemia from gi bleeding and anemia of chronic kidney disease: s/p 4 unit PRBC and again 1 more prbc today.Continue Aranesp, no iron deficiency per study 3/22.  Check H&H every 4 hour and transfuse as needed. Recent Labs  Lab 09/04/19 0851 09/04/19 1251 09/04/19 1813 09/04/19 2326 08/08/2019 0725  HGB 8.5* 8.4* 7.6* 7.2* 6.8*  HCT 25.8* 25.6* 23.5* 22.0* 21.3*   Bleeding from dialysis fistula-improved.  ESRD on HD MWF -last HD 3/26.Appreciate nephrology input.  LUE aVF pulsatile with LUE larger than RUE ?CV Stenosis- fistulogram once stable per nephrology.    Thrombocytopenia:stable, monitor Recent Labs  Lab 09/02/19 0444 09/02/19 1235 09/03/19 0250 09/03/19 0727 08/22/2019 0725  PLT 124* 139* 144* 159 129*   Elevated INR 2.0 on admission, improved to 1.3.  Continue daily vitamin K. Recent Labs  Lab 08/23/2019 1206 08/30/19 0418 09/01/19 0158 09/03/19 0727 09/04/19 0851  INR 1.5* 1.5* 1.4* 1.3* 1.3*   Hypertension: Needed IV fluid boluses and blood transfusions. BP soft. Monitor  Metabolic bone disease:continue calcitriol.  Anxiety/depression:Mood is stable resume home Klonopin as tolerated  CAD with history of previous cath followed by cardio at [facility]: Last stress test in 2019 with EF 52% low risk study. No chest pain.Monitor.Home meds aspirin Coreg on hold in the setting of GI bleed, soft blood pressure.     Diabetes mellitus:Hemoglobin A1c 4.9.Continue sliding scale insulin.Hold her home Januvia.  Having intermittent hypoglycemia. Recent Labs  Lab 09/04/19 2119 09/04/19 2247 08/21/2019 0352 08/31/2019 0616 08/12/2019 0900  GLUCAP 69* 81 83 79 97   Hypothyroidism:  Continue home Synthroid  Deconditioning:  Continue PT OT likely need a skilled nursing facility placement as suggested by PT.   Intermittent mild confusion/acute metabolic encephalopathy in the setting of GI bleed.  Monitor and keep on fall precaution.  Family reports patient had intermittent confusion.  She is alert awake oriented this morning and appropriately interacting.    Goals of care:patient is a full code, given difficult situation complex comorbidities will benefit with palliative care following along.  Nutrition: Monitor blood sugar,  Diet Order            Diet NPO time specified  Diet effective now              Nutrition Problem: Inadequate oral intake Etiology: altered GI function Signs/Symptoms: meal completion < 25% Interventions: MVI, Prostat, Nepro shake Body mass index is 39.03 kg/m.   DVT prophylaxis:SCD-holding chemical prophylaxis due to anemia Code Status:FULL Family Communication: plan of care discussed with patient at bedside. Son at bedside was updated. Disposition Plan: Patient is from:home Anticipated Disposition: to  SNF, in TBD Barriers to discharge or conditions that needs to be met prior to discharge: Patient admitted with anemia 4.9 underwent EGD and colonoscopy, with ongoing rectal bleeding underwent IR embolization of the active bleeding and hepatic flexure, continues to have ongoing intermittent bleeding surgery GI and IR following-this time not much option given recurrent bleeding going for partial colectomy.  Consultants: General surgery, nephrology, gastroenterology, interventional radiology, PCCM.   Procedures: 09/03/2019 EGD: A few gastric polyps biopsied: hyperplastic polyp with eroded granulation tissue and surface reactive change.  Mild, nonbleeding, erosive gastropathy biopsied: Mild reactive gastropathy with some surface hyperplastic change, no H. Pylori.  Duodenal nodule biopsied: peptic duodenitis.  Protonix 40/day in place, Prilosec 20/daily, 81 ASA/daily PTA.  .   08/24/2019  colonoscopy.  6 polyps removed located in right and left colon.  Left colon diverticulosis.  Solitary rectal ulcer, no stigmata of bleeding. Pathology shows tubular adenomas, no HGD.  Ulcer not biopsied.   3/25 IR arteriogram with bleeding in the hepatic flexure and s/p embolization  Microbiology:see note  Medications: Scheduled Meds: . sodium chloride   Intravenous Once  . sodium chloride   Intravenous Once  . calcitRIOL  1 mcg Oral Q M,W,F-HD  . Chlorhexidine Gluconate Cloth  6 each Topical Q0600  . darbepoetin (ARANESP) injection - DIALYSIS  100 mcg Intravenous Q Wed-HD  . feeding supplement (NEPRO CARB STEADY)  237 mL Oral BID BM  . feeding supplement (PRO-STAT SUGAR FREE 64)  30 mL Oral BID  . insulin aspart  0-6 Units Subcutaneous TID WC  . levothyroxine  50 mcg Oral QAC breakfast  . multivitamin  1 tablet Oral QHS  . pantoprazole  40 mg Oral Q0600  . phytonadione  10 mg Oral Daily   Continuous Infusions: . sodium chloride    . sodium chloride    . cefoTEtan (CEFOTAN) IV      Antimicrobials: Anti-infectives (From admission, onward)   Start     Dose/Rate Route Frequency Ordered Stop   08/25/2019 1015  cefoTEtan (CEFOTAN) 2 g in sodium chloride 0.9 % 100 mL IVPB     2 g 200 mL/hr over 30 Minutes Intravenous  Once 08/14/2019 1002         Objective: Vitals: Today's Vitals   08/30/2019 0957 08/09/2019 0958 08/24/2019 1011 08/14/2019 1029  BP:  90/66  96/71  Pulse:      Resp: 20   15  Temp: 97.6 F (36.4 C)  98.1 F (36.7 C)  TempSrc: Oral  Oral Oral  SpO2:      Weight:      Height:      PainSc:        Intake/Output Summary (Last 24 hours) at 08/14/2019 1104 Last data filed at 08/10/2019 0400 Gross per 24 hour  Intake 360 ml  Output 1224 ml  Net -864 ml   Filed Weights   09/04/19 1250 09/04/19 1654 08/20/2019 0424  Weight: 86 kg 84.7 kg 84.7 kg   Weight change: -0.3 kg   Intake/Output from previous day: 03/29 0701 - 03/30 0700 In: 360 [P.O.:360] Out: 1224    Intake/Output this shift: No intake/output data recorded.  Examination:  General exam: AAOX3, obese, not in distress, obese. HEENT:Oral mucosa moist, Ear/Nose WNL grossly,dentition normal. Respiratory system: bilaterally diminished, no use of accessory muscles,non tender. Cardiovascular system: S1 & S2 +, regular, No JVD. Gastrointestinal system: Abdomen soft, obese, NT,ND, BS+. Nervous System:Alert, awake, moving extremities and grossly nonfocal. Extremities: No edema, distal peripheral pulses palpable.  Skin: No rashes,no icterus. MSK: Normal muscle bulk,tone, power  Data Reviewed: I have personally reviewed following labs and imaging studies CBC: Recent Labs  Lab 09/02/19 0444 09/02/19 0444 09/02/19 1235 09/02/19 1434 09/03/19 0250 09/03/19 0250 09/03/19 0727 09/03/19 1100 09/04/19 0851 09/04/19 1251 09/04/19 1813 09/04/19 2326 08/08/2019 0725  WBC 6.3  --  7.0  --  7.6  --  7.6  --   --   --   --   --  7.9  HGB 7.8*   < > 8.6*   < > 9.1*   < > 9.9*   < > 8.5* 8.4* 7.6* 7.2* 6.8*  HCT 24.5*   < > 26.6*   < > 27.8*   < > 30.9*   < > 25.8* 25.6* 23.5* 22.0* 21.3*  MCV 94.6  --  94.3  --  94.6  --  96.3  --   --   --   --   --  96.8  PLT 124*  --  139*  --  144*  --  159  --   --   --   --   --  129*   < > = values in this interval not displayed.   Basic Metabolic Panel: Recent Labs  Lab 08/30/19 0418 08/30/19 0418 08/31/19 0141 09/01/19 0158 09/02/19 0444 09/03/19 0727 09/04/19 0851  NA 138   < > 136 136 138 137 136  K 3.7   < > 3.9 4.5 3.5 3.6 4.5  CL 103   < > 100 101 103 101 100  CO2 23   < > '24 23 24 25 '$ 21*  GLUCOSE 105*   < > 114* 85 88 84 85  BUN 14   < > 7* 12 <5* 13 18  CREATININE 4.53*   < > 2.88* 3.77* 2.70* 3.87* 4.81*  CALCIUM 7.0*   < > 7.1* 7.0* 7.2* 7.5* 7.6*  MG 1.6*  --   --   --   --   --   --   PHOS 1.9*  --   --   --   --   --   --    < > = values in this interval not displayed.   GFR: Estimated Creatinine Clearance: 10.2 mL/min (A)  (by C-G formula based on SCr of 4.81 mg/dL (H)). Liver Function Tests: Recent Labs  Lab 08/30/19 0418  ALBUMIN 1.6*   No results for input(s): LIPASE,  AMYLASE in the last 168 hours. No results for input(s): AMMONIA in the last 168 hours. Coagulation Profile: Recent Labs  Lab 08/08/2019 1206 08/30/19 0418 09/01/19 0158 09/03/19 0727 09/04/19 0851  INR 1.5* 1.5* 1.4* 1.3* 1.3*   Cardiac Enzymes: No results for input(s): CKTOTAL, CKMB, CKMBINDEX, TROPONINI in the last 168 hours. BNP (last 3 results) No results for input(s): PROBNP in the last 8760 hours. HbA1C: No results for input(s): HGBA1C in the last 72 hours. CBG: Recent Labs  Lab 09/04/19 2119 09/04/19 2247 09/06/2019 0352 09/04/2019 0616 08/30/2019 0900  GLUCAP 69* 81 83 79 97   Lipid Profile: No results for input(s): CHOL, HDL, LDLCALC, TRIG, CHOLHDL, LDLDIRECT in the last 72 hours. Thyroid Function Tests: No results for input(s): TSH, T4TOTAL, FREET4, T3FREE, THYROIDAB in the last 72 hours. Anemia Panel: No results for input(s): VITAMINB12, FOLATE, FERRITIN, TIBC, IRON, RETICCTPCT in the last 72 hours. Sepsis Labs: No results for input(s): PROCALCITON, LATICACIDVEN in the last 168 hours.  Recent Results (from the past 240 hour(s))  Respiratory Panel by RT PCR (Flu A&B, Covid) - Nasopharyngeal Swab     Status: None   Collection Time: 08/11/2019  3:17 PM   Specimen: Nasopharyngeal Swab  Result Value Ref Range Status   SARS Coronavirus 2 by RT PCR NEGATIVE NEGATIVE Final    Comment: (NOTE) SARS-CoV-2 target nucleic acids are NOT DETECTED. The SARS-CoV-2 RNA is generally detectable in upper respiratoy specimens during the acute phase of infection. The lowest concentration of SARS-CoV-2 viral copies this assay can detect is 131 copies/mL. A negative result does not preclude SARS-Cov-2 infection and should not be used as the sole basis for treatment or other patient management decisions. A negative result may occur with   improper specimen collection/handling, submission of specimen other than nasopharyngeal swab, presence of viral mutation(s) within the areas targeted by this assay, and inadequate number of viral copies (<131 copies/mL). A negative result must be combined with clinical observations, patient history, and epidemiological information. The expected result is Negative. Fact Sheet for Patients:  PinkCheek.be Fact Sheet for Healthcare Providers:  GravelBags.it This test is not yet ap proved or cleared by the [place] FDA and  has been authorized for detection and/or diagnosis of SARS-CoV-2 by FDA under an Emergency Use Authorization (EUA). This EUA will remain  in effect (meaning this test can be used) for the duration of the COVID-19 declaration under Section 564(b)(1) of the Act, 21 U.S.C. section 360bbb-3(b)(1), unless the authorization is terminated or revoked sooner.    Influenza A by PCR NEGATIVE NEGATIVE Final   Influenza B by PCR NEGATIVE NEGATIVE Final    Comment: (NOTE) The Xpert Xpress SARS-CoV-2/FLU/RSV assay is intended as an aid in  the diagnosis of influenza from Nasopharyngeal swab specimens and  should not be used as a sole basis for treatment. Nasal washings and  aspirates are unacceptable for Xpert Xpress SARS-CoV-2/FLU/RSV  testing. Fact Sheet for Patients: PinkCheek.be Fact Sheet for Healthcare Providers: GravelBags.it This test is not yet approved or cleared by the [place] FDA and  has been authorized for detection and/or diagnosis of SARS-CoV-2 by  FDA under an Emergency Use Authorization (EUA). This EUA will remain  in effect (meaning this test can be used) for the duration of the  Covid-19 declaration under Section 564(b)(1) of the Act, 21  U.S.C. section 360bbb-3(b)(1), unless the authorization is  terminated or revoked. Performed at  [facility], [place] [address] [place], [place] [address]  MRSA PCR Screening     Status: None   Collection Time: 09/02/2019  9:21 PM   Specimen: Nasopharyngeal  Result Value Ref Range Status   MRSA by PCR NEGATIVE NEGATIVE Final    Comment:        The GeneXpert MRSA Assay (FDA approved for NASAL specimens only), is one component of a comprehensive MRSA colonization surveillance program. It is not intended to diagnose MRSA infection nor to guide or monitor treatment for MRSA infections. Performed at [facility], [place] [address] [place], [place] [address]       Radiology Studies: No results found.   LOS: 9 days   Time spent: More than 50% of that time was spent in counseling and/or coordination of care.  Antonieta Pert, MD [facility]  09/06/2019, 11:04 AM

## 2019-09-05 NOTE — Anesthesia Preprocedure Evaluation (Signed)
Anesthesia Evaluation  Patient identified by MRN, date of birth, ID band Patient awake    Reviewed: Allergy & Precautions, NPO status , Patient's Chart, lab work & pertinent test results, reviewed documented beta blocker date and time   History of Anesthesia Complications Negative for: history of anesthetic complications  Airway Mallampati: III  TM Distance: >3 FB Neck ROM: Full  Mouth opening: Limited Mouth Opening  Dental no notable dental hx. (+) Dental Advisory Given   Pulmonary asthma , former smoker,    Pulmonary exam normal        Cardiovascular hypertension, Pt. on medications and Pt. on home beta blockers + CAD, + Past MI, + Cardiac Stents (Left main 2017) and +CHF  Normal cardiovascular exam+ Valvular Problems/Murmurs AS      Neuro/Psych  Headaches,  Neuromuscular disease (h/o Parkinson's) CVA (right sided weakness), Residual Symptoms negative psych ROS   GI/Hepatic Neg liver ROS, GERD  Medicated,GI bleed   Endo/Other  diabetes, Type 2, Oral Hypoglycemic AgentsHypothyroidism Morbid obesity (BMI 40)  Renal/GU ESRF and DialysisRenal disease (dialysis MWF)  negative genitourinary   Musculoskeletal negative musculoskeletal ROS (+)   Abdominal   Peds  Hematology  (+) Blood dyscrasia (Hgb 8.3), anemia ,   Anesthesia Other Findings  Echo 12/2017: moderate AS, mild AR, EF 60-65%  Myoview 09/20/17: EF 45-54%, no ischemia, low risk study  Reproductive/Obstetrics                             Anesthesia Physical  Anesthesia Plan  ASA: IV  Anesthesia Plan: General   Post-op Pain Management:    Induction: Intravenous  PONV Risk Score and Plan: 4 or greater and Treatment may vary due to age or medical condition, Ondansetron, Dexamethasone and Diphenhydramine  Airway Management Planned: Oral ETT  Additional Equipment: None  Intra-op Plan:   Post-operative Plan: Extubation in  OR  Informed Consent: I have reviewed the patients History and Physical, chart, labs and discussed the procedure including the risks, benefits and alternatives for the proposed anesthesia with the patient or authorized representative who has indicated his/her understanding and acceptance.     Dental advisory given  Plan Discussed with: CRNA and Anesthesiologist  Anesthesia Plan Comments:         Anesthesia Quick Evaluation

## 2019-09-05 NOTE — Anesthesia Procedure Notes (Signed)
Central Venous Catheter Insertion Performed by: Duane Boston, MD, anesthesiologist Start/End03/09/2019 3:19 PM, 09/05/2019 3:29 PM Patient location: Pre-op. Preanesthetic checklist: patient identified, IV checked, site marked, risks and benefits discussed, surgical consent, monitors and equipment checked, pre-op evaluation, timeout performed and anesthesia consent Position: Trendelenburg Lidocaine 1% used for infiltration and patient sedated Hand hygiene performed , maximum sterile barriers used  and Seldinger technique used Catheter size: 8 Fr Total catheter length 16. Central line was placed.Double lumen Procedure performed using ultrasound guided technique. Ultrasound Notes:anatomy identified, needle tip was noted to be adjacent to the nerve/plexus identified, no ultrasound evidence of intravascular and/or intraneural injection and image(s) printed for medical record Attempts: 1 Following insertion, dressing applied, line sutured and Biopatch. Post procedure assessment: blood return through all ports, free fluid flow and no air  Patient tolerated the procedure well with no immediate complications.

## 2019-09-05 NOTE — Op Note (Signed)
Roberta Bryant 08/24/2019   Pre-op Diagnosis: LOWER GASTROINTESTINAL HEMORRHAGE     Post-op Diagnosis: SAME  Procedure(s): RIGHT HEMICOLECTOMY  Surgeon(s): Coralie Keens, MD  Anesthesia: General  Staff:  Circulator: Leonia Reader, RN Physician Assistant: Wellington Hampshire, PA-C Scrub Person: Paulette Blanch, RN; Small, Benjaman Lobe, RN Circulator Assistant: Celene Squibb, RN  Estimated Blood Loss: 150 CC               Specimens: SENT TO PATH  Indications: This is a 69 year old female with end-stage renal disease on hemodialysis who presented with a lower GI bleed.  This could not be visualized with lower endoscopy.  She underwent a bleeding scan showing a positive area in the hepatic flexure of the colon.  She underwent arteriogram by interventional radiology.  An area of extravasation was seen at the hepatic flexure.  She underwent embolization of this.  Despite this, bleeding occurred again requiring multiple transfusions.  After discussion with interventional radiology and gastroenterology, the decision was made to proceed to the operating room for a partial colectomy.  The risks were discussed with the family and the patient.  Procedure: The patient was brought to the operating room and identifies correct patient.  She is placed upon the operating room table and general anesthesia was induced.  Her abdomen was prepped and draped in usual sterile fashion.  I created an upper midline incision with a scalpel and took this just to the right side of the umbilicus.  I then dissected down through the subcutaneous tissue with electrocautery.  The patient had 2 small fascial defect containing omentum which I excised.  I then was able to open of the peritoneum the entire length of the incision.  The patient had some adhesions of omentum to the right upper quadrant from her previous cholecystectomy.  I took these down with electrocautery.  The patient was found to have a moderate amount of  ascites.  750 cc of ascitic fluid was aspirated from the abdominal cavity.  The liver itself did not appear grossly cirrhotic.  The patient's tissue was very friable.  I was able to mobilize the right colon along the white line of Toldt and take down the hepatic flexure as well using both the cautery and the LigaSure.  During this, a mesenteric vein did bleed fairly profusely but was controlled with several silk sutures going toward the porta.  I took the omentum off of the colon toward the proximal midportion of the transverse colon.  I next transected the terminal ileum with a GIA 75 stapler and then transected the transverse colon past the hepatic flexure with the GIA 75 stapler.  I then took down the mesentery with the LigaSure device as well as 2-0 silk sutures.  We then evaluated the mesentery in the area of initial bleeding and hemostasis appeared to be achieved.  I then reapproximated the distal small bowel to the transverse colon in a side-to-side fashion with silk sutures.  I then performed an enterotomy and colotomy and then used the GIA 75 stapler to create a side-to-side anastomosis.  The open end was then closed with a TX 60 stapler.  I then reinforced the staple line with several interrupted silk sutures.  The anastomosis appeared pink and well perfused.  We then irrigated the abdomen with 2 L normal saline.  Again hemostasis appeared to be achieved.  We then applied new drapes and changed gowns and gloves.  The patient's midline fascia was then closed with running #  1 looped PDS suture.  The skin was irrigated and closed with skin staples.  Honeycomb dressing was then applied.  The patient appeared to tolerate procedure well.  All counts were correct at the end of the procedure.  The patient was then extubated in the operating room and taken in a stable condition to the recovery room.          Coralie Keens   Date: 08/15/2019  Time: 4:35 PM

## 2019-09-05 NOTE — Progress Notes (Signed)
7 Days Post-Op  Subjective: CC: Patient reports that she is tired this morning. Denies n/v. Tolerated clears yesterday. Several small liquidy bm's that were bloody with clots noted per report  Objective: Vital signs in last 24 hours: Temp:  [97.6 F (36.4 C)-98.2 F (36.8 C)] 98.2 F (36.8 C) (03/29 2330) Pulse Rate:  [61-127] 99 (03/29 2340) Resp:  [14-23] 23 (03/29 2340) BP: (70-150)/(39-87) 106/53 (03/29 2340) SpO2:  [97 %-100 %] 97 % (03/29 2340) Weight:  [84.7 kg-86 kg] 84.7 kg (03/30 0424) Last BM Date: 09/04/19  Intake/Output from previous day: 03/29 0701 - 03/30 0700 In: 360 [P.O.:360] Out: 1224  Intake/Output this shift: No intake/output data recorded.  PE: Gen:  Alert, NAD, pleasant Pulm: Normal rate and effort  Abd: Soft, ND, NT, +BS, no HSM Psych: A&Ox3  Skin: Pale. No rashes noted, warm and dry  Lab Results:  Recent Labs    09/03/19 0250 09/03/19 0250 09/03/19 0727 09/03/19 1100 09/04/19 1813 09/04/19 2326  WBC 7.6  --  7.6  --   --   --   HGB 9.1*   < > 9.9*   < > 7.6* 7.2*  HCT 27.8*   < > 30.9*   < > 23.5* 22.0*  PLT 144*  --  159  --   --   --    < > = values in this interval not displayed.   BMET Recent Labs    09/03/19 0727 09/04/19 0851  NA 137 136  K 3.6 4.5  CL 101 100  CO2 25 21*  GLUCOSE 84 85  BUN 13 18  CREATININE 3.87* 4.81*  CALCIUM 7.5* 7.6*   PT/INR Recent Labs    09/03/19 0727 09/04/19 0851  LABPROT 16.3* 15.8*  INR 1.3* 1.3*   CMP     Component Value Date/Time   NA 136 09/04/2019 0851   K 4.5 09/04/2019 0851   CL 100 09/04/2019 0851   CO2 21 (L) 09/04/2019 0851   GLUCOSE 85 09/04/2019 0851   BUN 18 09/04/2019 0851   CREATININE 4.81 (H) 09/04/2019 0851   CALCIUM 7.6 (L) 09/04/2019 0851   CALCIUM 7.4 (L) 09/12/2008 0615   PROT 4.3 (L) 09/02/2019 1247   ALBUMIN 1.6 (L) 08/30/2019 0418   AST 36 08/28/2019 1247   ALT 17 08/31/2019 1247   ALKPHOS 50 08/31/2019 1247   BILITOT 1.4 (H) 08/17/2019 1247     GFRNONAA 9 (L) 09/04/2019 0851   GFRAA 10 (L) 09/04/2019 0851   Lipase     Component Value Date/Time   LIPASE 43 07/19/2018 2330       Studies/Results: No results found.  Anti-infectives: Anti-infectives (From admission, onward)   None       Assessment/Plan ESRD - dialysis on MWF History of CHF HTN CAD History of stroke DM2 Deconditioned Hx Covid about 8 weeks ago Protein Calorie Malnutrition - Per-Alb 8.6  Lower GI bleed ABL Anemia  - S/p Colonoscopy 3/23 which showed gastric polyps and erosive gastropathy, 6polypectomies and one rectal ulcer with recurrent GIbleed,mostlikely diverticular hemorrhage - CTA3/25showed active bleeding at hepatic flexure - S/p Angio w/ coil embolization of an accessory branch of the middle colic artery on 0/94 by IR - NM GI Bleed scan showed GI bleed in the right upper quadrant, likely hepatic flexure of the colon -Continues to have evidence of slow bleed - hgb 9.9>9.5>8.4>7.6>7.2. BP stable - Continue to trend hgb. Per IR note, "if bleeding continues and she does not proceed to  surgery, we could consider another arteriogram to see if there is a potential branch that we could embolize without high risk of colonic infarction." She may need right hemicolectomy but would like to avoid if possible given she is very high risk for surgery and potential post op complications given underlying co-morbidities and nutritional status (pre-alb 8.6). I think the next step would be to have IR evaluate if hgb continues to downtrend and does not plateau. Nursing notes discussion with IR reviewed from yesterday. Will discuss with MD and update with further plans.   FEN - Currently on CLD VTE - SCDs ID - None   LOS: 9 days    Jillyn Ledger , [facility] Surgery 08/20/2019, 7:38 AM Please see Amion for pager number during day hours 7:00am-4:30pm

## 2019-09-05 NOTE — Progress Notes (Signed)
CRITICAL VALUE ALERT  Critical Value:  Hbg 6.8  Date & Time Notied:  08/26/2019 at [place]  Provider Notified: Shirley Friar MD  Orders Received/Actions taken: have orders already for 1 unit PRBC, MD notified of critical lab value

## 2019-09-05 NOTE — Transfer of Care (Signed)
Immediate Anesthesia Transfer of Care Note  Patient: Roberta Bryant  Procedure(s) Performed: PARTIAL COLECTOMY (N/A )  Patient Location: PACU  Anesthesia Type:General  Level of Consciousness: drowsy and patient cooperative  Airway & Oxygen Therapy: Patient Spontanous Breathing and Patient connected to nasal cannula oxygen  Post-op Assessment: Report given to RN and Post -op Vital signs reviewed and stable  Post vital signs: Reviewed and stable  Last Vitals:  Vitals Value Taken Time  BP 132/66 08/13/2019 1700  Temp    Pulse 86 08/23/2019 1704  Resp 22 08/09/2019 1704  SpO2 100 % 08/17/2019 1704  Vitals shown include unvalidated device data.  Last Pain:  Vitals:   09/04/2019 1250  TempSrc: Oral  PainSc:       Patients Stated Pain Goal: 0 (11/94/17 4081)  Complications: No apparent anesthesia complications

## 2019-09-05 NOTE — TOC Progression Note (Signed)
Transition of Care [facility]) - Progression Note    Patient Details  Name: BRAYLA PAT MRN: [id number] Date of Birth: July 29, 1950  Transition of Care [facility]) CM/SW [place], [place] Phone Number: 08/11/2019, 9:10 AM  Clinical Narrative:     [facility] team continues to follow for discharge planning needs once medically stable, patient has bed at [facility] when ready for dc.   Expected Discharge Plan: [place] Barriers to Discharge: Continued Medical Work up  Expected Discharge Plan and Services Expected Discharge Plan: [place] In-house Referral: Clinical Social Work Discharge Planning Services: CM Consult Post Acute Care Choice: [place] arrangements for the past 2 months: [place] Determinants of Health (SDOH) Interventions    Readmission Risk Interventions Readmission Risk Prevention Plan 08/14/2019  Transportation Screening Complete  Medication Review Press photographer) Referral to Pharmacy  PCP or Specialist appointment within 3-5 days of discharge Not Complete  PCP/Specialist Appt Not Complete comments plan for poss SNF  [place] or Home Care Consult Complete  SW Recovery Care/Counseling Consult Complete  Palliative Care Screening Not Applicable  Skilled Nursing Facility Complete  Some recent data might be hidden

## 2019-09-06 ENCOUNTER — Encounter: Payer: Self-pay | Admitting: *Deleted

## 2019-09-06 DIAGNOSIS — Z7189 Other specified counseling: Secondary | ICD-10-CM

## 2019-09-06 DIAGNOSIS — D649 Anemia, unspecified: Secondary | ICD-10-CM

## 2019-09-06 DIAGNOSIS — Z515 Encounter for palliative care: Secondary | ICD-10-CM

## 2019-09-06 LAB — TYPE AND SCREEN
ABO/RH(D): O POS
Antibody Screen: NEGATIVE
Unit division: 0
Unit division: 0
Unit division: 0

## 2019-09-06 LAB — HEMOGLOBIN AND HEMATOCRIT, BLOOD
HCT: 29.1 % — ABNORMAL LOW (ref 36.0–46.0)
HCT: 28.9 % — ABNORMAL LOW (ref 36.0–46.0)
HCT: 28.7 % — ABNORMAL LOW (ref 36.0–46.0)
HCT: 28.6 % — ABNORMAL LOW (ref 36.0–46.0)
Hemoglobin: 9.8 g/dL — ABNORMAL LOW (ref 12.0–15.0)
Hemoglobin: 9.7 g/dL — ABNORMAL LOW (ref 12.0–15.0)
Hemoglobin: 9.8 g/dL — ABNORMAL LOW (ref 12.0–15.0)
Hemoglobin: 9.7 g/dL — ABNORMAL LOW (ref 12.0–15.0)

## 2019-09-06 LAB — BPAM RBC
Blood Product Expiration Date: 202104272359
Blood Product Expiration Date: 202104272359
Blood Product Expiration Date: 202105022359
ISSUE DATE / TIME: 202103301005
ISSUE DATE / TIME: 202103301359
ISSUE DATE / TIME: 202103301359
Unit Type and Rh: 5100
Unit Type and Rh: 5100
Unit Type and Rh: 5100

## 2019-09-06 LAB — PROTIME-INR
INR: 1.4 — ABNORMAL HIGH (ref 0.8–1.2)
Prothrombin Time: 17.2 seconds — ABNORMAL HIGH (ref 11.4–15.2)

## 2019-09-06 LAB — GLUCOSE, CAPILLARY
Glucose-Capillary: 98 mg/dL (ref 70–99)
Glucose-Capillary: 97 mg/dL (ref 70–99)
Glucose-Capillary: 111 mg/dL — ABNORMAL HIGH (ref 70–99)
Glucose-Capillary: 125 mg/dL — ABNORMAL HIGH (ref 70–99)
Glucose-Capillary: 84 mg/dL (ref 70–99)
Glucose-Capillary: 84 mg/dL (ref 70–99)

## 2019-09-06 LAB — RENAL FUNCTION PANEL
Albumin: 2 g/dL — ABNORMAL LOW (ref 3.5–5.0)
Anion gap: 11 (ref 5–15)
BUN: 10 mg/dL (ref 8–23)
CO2: 22 mmol/L (ref 22–32)
Calcium: 7.1 mg/dL — ABNORMAL LOW (ref 8.9–10.3)
Chloride: 102 mmol/L (ref 98–111)
Creatinine, Ser: 3.82 mg/dL — ABNORMAL HIGH (ref 0.44–1.00)
GFR calc Af Amer: 13 mL/min — ABNORMAL LOW (ref >60)
GFR calc non Af Amer: 11 mL/min — ABNORMAL LOW (ref >60)
Glucose, Bld: 117 mg/dL — ABNORMAL HIGH (ref 70–99)
Phosphorus: 3.4 mg/dL (ref 2.5–4.6)
Potassium: 3.7 mmol/L (ref 3.5–5.1)
Sodium: 135 mmol/L (ref 135–145)

## 2019-09-06 MED ORDER — DARBEPOETIN ALFA 100 MCG/0.5ML IJ SOSY
PREFILLED_SYRINGE | INTRAMUSCULAR | Status: AC
  Filled 2019-09-06: qty 0.5

## 2019-09-06 MED ORDER — SODIUM CHLORIDE 0.9% FLUSH
10.0000 mL | INTRAVENOUS | Status: DC | PRN
  Administered 2019-09-09: 20 mL

## 2019-09-06 MED ORDER — CALCITRIOL 0.5 MCG PO CAPS
ORAL_CAPSULE | ORAL | Status: AC
  Filled 2019-09-06: qty 2

## 2019-09-06 MED ORDER — SODIUM CHLORIDE 0.9% FLUSH
10.0000 mL | Freq: Two times a day (BID) | INTRAVENOUS | Status: DC
  Administered 2019-09-06 – 2019-09-12 (×10): 10 mL

## 2019-09-06 NOTE — Anesthesia Postprocedure Evaluation (Signed)
Anesthesia Post Note  Patient: Roberta Bryant  Procedure(s) Performed: PARTIAL COLECTOMY (N/A )     Patient location during evaluation: PACU Anesthesia Type: General Level of consciousness: sedated Pain management: pain level controlled Vital Signs Assessment: post-procedure vital signs reviewed and stable Respiratory status: spontaneous breathing and respiratory function stable Cardiovascular status: stable Postop Assessment: no apparent nausea or vomiting Anesthetic complications: no                  Azie Mcconahy DANIEL

## 2019-09-06 NOTE — Progress Notes (Signed)
Assessed with ultrasound for PIV, no suitable seen in right forearm ,very poor vein. Blue Black color discoloration seen in upper arm. RN aware.Pt have left arm restriction due fistula/graft located.

## 2019-09-06 NOTE — Progress Notes (Signed)
Roberta Bryant Progress Note   Subjective: Underwent right hemicolectomy yesterday. Seen in room, mild abd pain this am. Some confusion this am, asking to lay on stomach, seeing "bugs" on wall   Objective Vitals:   09/06/19 0000 09/06/19 0400 09/06/19 0438 09/06/19 0603  BP: 123/72 119/63    Pulse: (!) 102 96    Resp: (!) 21 19    Temp:   98.2 F (36.8 C)   TempSrc:   Axillary   SpO2: 99% 94%    Weight:    85.2 kg  Height:        Weight change: -0.8 kg   Additional Objective Labs: Basic Metabolic Panel: Recent Labs  Lab 09/04/19 0851 09/04/19 0851 09/04/2019 1542 09/04/2019 1833 09/06/19 0643  NA 136   < > 140 136 135  K 4.5   < > 3.5 3.4* 3.7  CL 100  --   --  101 102  CO2 21*  --   --  22 22  GLUCOSE 85  --   --  116* 117*  BUN 18  --   --  8 10  CREATININE 4.81*  --   --  3.48* 3.82*  CALCIUM 7.6*  --   --  7.1* 7.1*  PHOS  --   --   --   --  3.4   < > = values in this interval not displayed.   CBC: Recent Labs  Lab 09/02/19 0444 09/02/19 0444 09/02/19 1235 09/02/19 1434 09/03/19 0250 09/03/19 0250 09/03/19 0727 09/03/19 1100 08/07/2019 0725 08/17/2019 1542 08/15/2019 2233 09/06/19 0233 09/06/19 0632  WBC 6.3   < > 7.0   < > 7.6  --  7.6  --  7.9  --   --   --   --   HGB 7.8*   < > 8.6*   < > 9.1*   < > 9.9*   < > 6.8*   < > 10.0* 9.8* 9.7*  HCT 24.5*   < > 26.6*   < > 27.8*   < > 30.9*   < > 21.3*   < > 29.6* 29.1* 28.9*  MCV 94.6  --  94.3  --  94.6  --  96.3  --  96.8  --   --   --   --   PLT 124*   < > 139*   < > 144*  --  159  --  129*  --   --   --   --    < > = values in this interval not displayed.   Blood Culture    Component Value Date/Time   SDES URINE, CLEAN CATCH 01/04/2018 1524   SPECREQUEST  01/04/2018 1524    NONE Performed at [facility] [address] [place], [place] [address]    CULT MULTIPLE SPECIES PRESENT, SUGGEST RECOLLECTION (A) 01/04/2018 1524   REPTSTATUS 01/05/2018 FINAL 01/04/2018 1524      Physical Exam General: Obese female in bed, NAD  Heart: Tachy, systolic murmur  Lungs: Clear bilaterally  Abdomen: obese, soft diffuse R sided tenderness  Extremities: Non pitting edema bilaterally  Dialysis Access: LUE AVF +bruit   Medications: . sodium chloride    . sodium chloride    . sodium chloride    . sodium chloride 600 mL/hr at 08/08/2019 1649  . methocarbamol (ROBAXIN) IV     . sodium chloride   Intravenous Once  . sodium chloride   Intravenous Once  .  sodium chloride   Intravenous Once  . acetaminophen  650 mg Oral Q6H  . calcitRIOL  1 mcg Oral Q M,W,F-HD  . Chlorhexidine Gluconate Cloth  6 each Topical Q0600  . darbepoetin (ARANESP) injection - DIALYSIS  100 mcg Intravenous Q Wed-HD  . feeding supplement (NEPRO CARB STEADY)  237 mL Oral BID BM  . feeding supplement (PRO-STAT SUGAR FREE 64)  30 mL Oral BID  . insulin aspart  0-6 Units Subcutaneous TID WC  . levothyroxine  50 mcg Oral QAC breakfast  . multivitamin  1 tablet Oral QHS  . pantoprazole  40 mg Oral Q0600  . phytonadione  10 mg Oral Daily    Dialysis Orders: [facility] MWF 3.5 hr EDW 85 400/A 1.5 3 K 2.25 Ca 36 degrees profile 4 var Na 148 linear left upper AVF heparin 4000 with 1500 mid tmt Mircera 100 q 2 wks -last had 60 on 3/10 calcitriol 1 Binders: fosrenol 2 gm ac  Assessment/Plan: 1. ABLA 2/2 recurrent diverticular GI bleeding - s/p colonoscopy -->6 polyps removed/non bleeding rectal ulcer. Bleeding recurred and now s/p IR embolization.  Bleeding scan 3/28  + active bleed RUQ near hepatic flexure - same area of prior embolization. Ongoing bloody stools. S/p 6 units prbcs-->>GI/CCS following -s/p right hemicolectomy 3/30 2. ESRD- HD MWF. No heparin on HD. Next HD 3/31 3. AVF issues -recent bleeding issues with access - no heparin HD. Last f'gram 05/2018. AF have been variable without intervention over the last year. Arm remains swollen but AVF functional - defer evaluation for now - given ongoing  bleeding issues 4. Hypotension/volume- BPlow/stable. UF as tolerated. Using alb, prn to support BP. Is at her outpatient dry weight.  5. Anemia  Hgb trending down d/t ongoing GI bleed as above.S/p 6 units prbcs -last 3/30. Hgb 9.7. Continue Aranesp 123mcg q Wed. 6. Metabolic bone disease-Phos low without binders/Corr Ca ok.  On Calcitriol for Ca support.  corrected Ca ok since alb is so low 7. Severe malnutrition- Alb < 2.  Continue pro-stat + nepro. 8. CAD - Coreg on hold d/t hypotension  9. DM - Insulin per primary  10. GOC - for d/c to SNF when medically stable -  Roberta Child PA-C [facility] Pager [phone] 09/06/2019,9:10 AM  LOS: 10 days

## 2019-09-06 NOTE — Procedures (Signed)
Seen and examined on dialysis.  Procedure supervised.  Blood pressure 112/66.  Tolerating goal.  Left AVF in use.    Claudia Desanctis, MD 09/06/2019  2:21 PM

## 2019-09-06 NOTE — Progress Notes (Signed)
Patient has a yellow MEWS score posted due to respirations and pulse. Patient just woke up from a nap and C/O surgical pain. Tylenol was given as ordered for patient and she drank some water. Will reassess patient to check on her, vital signs remain at every hour. Patient remains stable post op. Will continue to monitor closely.

## 2019-09-06 NOTE — Progress Notes (Addendum)
1 Day Post-Op  Subjective: CC: Patient denies any real abdominal pain. Notes some soreness on the left. She denies n/v. She is passing flatus. Feels like she needs to have a bowel movement now. Hgb stable   Objective: Vital signs in last 24 hours: Temp:  [97.5 F (36.4 C)-98.2 F (36.8 C)] 98.2 F (36.8 C) (03/31 0438) Pulse Rate:  [87-102] 96 (03/31 0400) Resp:  [9-22] 19 (03/31 0400) BP: (87-134)/(61-79) 119/63 (03/31 0400) SpO2:  [94 %-100 %] 94 % (03/31 0400) Weight:  [85.2 kg] 85.2 kg (03/31 0603) Last BM Date: 08/12/2019  Intake/Output from previous day: 03/30 0701 - 03/31 0700 In: 1320 [I.V.:20; Blood:950; IV Piggyback:350] Out: 900 [Blood:150] Intake/Output this shift: No intake/output data recorded.  Blood pressure 119/63, pulse 96, temperature 98.2 F (36.8 C), temperature source Axillary, resp. rate 19, height 4\' 10"  (1.473 m), weight 85.2 kg, SpO2 94 %. PE: Gen:  Alert, NAD, pleasant Pulm: Normal rate and effort  Abd: Soft, ND, appropriately tender around midline incision L>R, hypoactive bowel sounds Skin: no rashes noted, warm and dry   Lab Results:  Recent Labs    09/03/19 0727 09/03/19 1100 08/07/2019 0725 08/24/2019 1542 09/06/19 0233 09/06/19 0632  WBC 7.6  --  7.9  --   --   --   HGB 9.9*   < > 6.8*   < > 9.8* 9.7*  HCT 30.9*   < > 21.3*   < > 29.1* 28.9*  PLT 159  --  129*  --   --   --    < > = values in this interval not displayed.   BMET Recent Labs    09/04/19 0851 09/04/19 0851 09/03/2019 1542 08/07/2019 1833  NA 136   < > 140 136  K 4.5   < > 3.5 3.4*  CL 100  --   --  101  CO2 21*  --   --  22  GLUCOSE 85  --   --  116*  BUN 18  --   --  8  CREATININE 4.81*  --   --  3.48*  CALCIUM 7.6*  --   --  7.1*   < > = values in this interval not displayed.   PT/INR Recent Labs    08/15/2019 1833 09/06/19 0632  LABPROT 19.3* 17.2*  INR 1.6* 1.4*   CMP     Component Value Date/Time   NA 136 08/24/2019 1833   K 3.4 (L) 08/11/2019  1833   CL 101 08/28/2019 1833   CO2 22 08/31/2019 1833   GLUCOSE 116 (H) 09/02/2019 1833   BUN 8 08/13/2019 1833   CREATININE 3.48 (H) 08/13/2019 1833   CALCIUM 7.1 (L) 08/10/2019 1833   CALCIUM 7.4 (L) 09/12/2008 0615   PROT 4.3 (L) 08/16/2019 1833   ALBUMIN 2.2 (L) 08/16/2019 1833   AST 41 08/09/2019 1833   ALT 22 08/12/2019 1833   ALKPHOS 52 08/09/2019 1833   BILITOT 1.4 (H) 08/10/2019 1833   GFRNONAA 13 (L) 08/15/2019 1833   GFRAA 15 (L) 09/02/2019 1833   Lipase     Component Value Date/Time   LIPASE 43 07/19/2018 2330       Studies/Results: DG CHEST PORT 1 VIEW  Result Date: 08/23/2019 CLINICAL DATA:  Status post PICC placement. EXAM: PORTABLE CHEST 1 VIEW COMPARISON:  Radiograph 08/23/2019 FINDINGS: Right internal jugular central venous catheter tip in the lower SVC. No pneumothorax. No other central lines are visualized. Low lung volumes. Increasing  bibasilar atelectasis from prior. Unchanged heart size and mediastinal contours with aortic atherosclerosis. No large pleural effusion. Reverse right shoulder arthroplasty. IMPRESSION: 1. Tip of the right internal jugular central venous catheter in the lower SVC. No pneumothorax. 2. Low lung volumes with increasing bibasilar atelectasis. Electronically Signed   By: Keith Rake M.D.   On: 09/04/2019 18:18    Anti-infectives: Anti-infectives (From admission, onward)   Start     Dose/Rate Route Frequency Ordered Stop   08/26/2019 1015  cefoTEtan (CEFOTAN) 2 g in sodium chloride 0.9 % 100 mL IVPB     2 g 200 mL/hr over 30 Minutes Intravenous  Once 09/04/2019 1002 08/16/2019 1600       Assessment/Plan ESRD - dialysis on MWF History of CHF HTN CAD History of stroke DM2 Deconditioning HxCovid about 8 weeks ago Protein Calorie Malnutrition - Per-Alb 8.6  Lower GI bleed ABL Anemia - s/p right hemicolectomy - Dr. Ninfa Linden - 08/10/2019 - POD #1 - Start on CLD today. If develops ileus, consider starting TPN given  prolonged duration without adequate oral intake prior to surgery - Hgb stable at 9.7 this AM from 10.0 last night. Cont to trend - Mobilize, PT. PT currently recommending SNF - Pulm toilet, IS  FEN -CLD VTE -SCDs, hold chemical prophylaxis for now. Monitor hgb  ID - Cefotetan peri-op Foley - Okay to d/c foley today  Follow up - TBD   LOS: 10 days    Jillyn Ledger , [facility] Surgery 09/06/2019, 7:20 AM Please see Amion for pager number during day hours 7:00am-4:30pm

## 2019-09-06 NOTE — Progress Notes (Signed)
PROGRESS NOTE    Roberta Bryant  YIF:[id number] DOB: Sep 14, 1950 DOA: 08/20/2019 PCP: [name], MD   Brief Narrative:  HPI on 08/17/2019 by Ms. Roberta Gens, NP (ICU) 69 y/o F who presented to [facility] on 3/21 with reports of dark stools and altered mental status.    The patient was seen on 3/17 in the ER after she was noted to be altered staring off and not talking during HD. She reported feeling generalized weakness and missed HD on 3/15.  Only completed 50 minutes of HD on 3/17. Her Hgb at that time was 8.8. She reports she had bleeding from her fistula on Friday 3/19 that was brisk and difficult to control.   She returns to the ER on 3/21 with reports of black-red stools, weakness, abdominal pain.  EMS found her to have a BP of 90/50 and treated her with 683ml NS.  BP improved to 116/50.  She was awake/alert on arrival with SBP's remaining in the 90's.  Her last HD session was 3/19.  The patient's initial labs concerning for worsening anemia with Hgb of 5.4 (iSTAT).  She was treated with 2 units blood in the ER.  CXR was notable for bibasilar atelectasis and low lung volumes but no acute process.  She was FOBT positive on presentation with subsequent melanic stools in the ER.  GI, Nephrology consulted.    PCCM consulted for admission.    Interim history Patient admitted with GI bleed, status post EGD and colonoscopy found to have nonbleeding erosive gastropathy, peptic duodenitis, removal of 6 polyps from the right and left colon.  Pathology showed tubular adenomas.  She continued to have rectal bleeding, CTA 08/31/2019 with active bleeding in the hepatic flexure and had embolization done.  She continues to have intermittent rectal bleeding and requires blood transfusions.  Patient did have bleeding scan which is positive for bleeding at the hepatic flexure at the previous embolization site.  General surgery consulted after IR recommendation, status post right hemicolectomy. Assessment &  Plan   Recurrent diverticular GI bleeding/acute blood loss anemia/anemia of chronic kidney disease -Gastroenterology consult appreciated -Status post EGD 3/22: Nonbleeding erosive gastropathy, peptic duodenitis -Status post colonoscopy 3/23: 6 polyps removed from the right and left colon, left colon diverticulosis, no stigmata of bleeding.  Pathology shows tubular adenomas -Status post CTA which showed active bleeding at the hepatic flexure, underwent arteriogram and embolization on 08/31/2019 -Continued to have bleeding, NM bleeding scan obtained on 09/02/2019 which showed hepatic flexure bleeding again. 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 -Improved  End-stage renal disease -Patient dialyzes Monday, Wednesday, Friday -Nephrology consulted and appreciated  LUE aVF pulsatile with LUE larger than RUE ?CV Stenosis -Per nephrology, patient will need fistulogram once stable  Thrombocytopenia -have improved, last platelets noted to be 129 -Need to monitor CBC  Elevated INR -Present on admission, has now improved, 1.4 today -Currently receiving vitamin K daily  Essential hypertension -BP has been soft, requiring patient to receive several IV fluid boluses as well as blood transfusion -Continue to monitor closely  Anxiety/depression -Continue Klonopin  Metabolic bone disease -Continue  calcitriol  Diabetes mellitus, type II -Hemoglobin A1c was 4.9 (08/10/2019) -Januvia currently held -Patient has  had intermittent hypoglycemia -Placed on insulin sliding scale with CBG monitoring  Hypothyroidism -Continue Synthroid  Coronary artery disease -Patient follows with cardiologist at [place] stress test was in 2019 showing an EF of 52%, low risk study -Currently no complaints of chest pain -Aspirin held due to GI bleed -Coreg held due to soft BP  Intermittent mild confusion/acute metabolic encephalopathy -Patient continues to have some confusion this more -Question whether this is due to GI bleeding -Continue to monitor closely -Family also reports patient had intermittent confusion  Goals of care -Palliative care consulted and appreciated  Deconditioning -Continue PT/OT-recommended SNF  Poor oral intake -Has had inadequate oral intake, likely due to GI function -Will continue to monitor -Nutrition consulted, continue supplements  DVT Prophylaxis  SCDs  Code Status: Full  Family Communication: None at bedside  Disposition Plan: Admitted from home for GI bleeding and anemia. S/p R hemicolectomy on 3/30. Pending further surgical recommendations and stabilization of hemoglobin. Suspect SNF when stable.   Consultants Neurosurgery Nephrology Gastroenterology Interventional radiology PCCM  Procedures  08/12/2019 EGD: Afew gastric polyps biopsied: hyperplastic polyp with eroded granulation tissue and surface reactive change. Mild, nonbleeding, erosive gastropathy biopsied:Mild reactive gastropathy with some surface hyperplastic change, no H. Pylori.Duodenal nodule biopsied:peptic duodenitis.   08/16/2019 colonoscopy: 6 polyps removed located in right and left colon. Left colon diverticulosis. Solitary rectal ulcer, no stigmata of bleeding. Pathology shows tubular adenomas,noHGD. Ulcer not biopsied.   08/31/2019: IR arteriogram with bleeding in hepatic flexure, status post embolization  08/27/2019: Right hemicolectomy  Antibiotics   Anti-infectives (From  admission, onward)   Start     Dose/Rate Route Frequency Ordered Stop   08/30/2019 1015  cefoTEtan (CEFOTAN) 2 g in sodium chloride 0.9 % 100 mL IVPB     2 g 200 mL/hr over 30 Minutes Intravenous  Once 09/03/2019 1002 08/14/2019 1600      Subjective:   Marianita Botkin seen and examined today.  Patient appears mildly confused this morning.  Continues to state that she needs to use the bathroom.  Has no other complaints.  Replies to most questions with "I do not care."  Objective:   Vitals:   09/06/19 0000 09/06/19 0400 09/06/19 0438 09/06/19 0603  BP: 123/72 119/63    Pulse: (!) 102 96    Resp: (!) 21 19    Temp:   98.2 F (36.8 C)   TempSrc:   Axillary   SpO2: 99% 94%    Weight:    85.2 kg  Height:        Intake/Output Summary (Last 24 hours) at 09/06/2019 0910 Last data filed at 09/01/2019 1624 Gross per 24 hour  Intake 1320 ml  Output 900 ml  Net 420 ml   Filed Weights   09/04/19 1654 08/12/2019 0424 09/06/19 0603  Weight: 84.7 kg 84.7 kg 85.2 kg    Exam  General: Well developed, chronically ill appearing, NAD  HEENT: NCAT, mucous membranes moist.   Cardiovascular: S1 S2 auscultated, RRR, +SEM  Respiratory: Clear to auscultation bilaterally  Abdomen: Soft, obese, TTP around midline incision, nondistended, hypoactive bowel sounds  Extremities: warm dry without cyanosis clubbing or edema  Neuro: AAOx1, nonfocal  Psych: Confused   Data Reviewed: I have personally reviewed following labs and imaging studies  CBC: Recent Labs  Lab 09/02/19 0444 09/02/19 0444 09/02/19 1235 09/02/19 1434 09/03/19 0250 09/03/19 0250 09/03/19 0727 09/03/19 1100 08/07/2019  0725 08/10/2019 0725 08/10/2019 1542 09/04/2019 1814 08/13/2019 2233 09/06/19 0233 09/06/19 0632  WBC 6.3  --  7.0  --  7.6  --  7.6  --  7.9  --   --   --   --   --   --   HGB 7.8*   < > 8.6*   < > 9.1*   < > 9.9*   < > 6.8*   < > 7.5* 10.4* 10.0* 9.8* 9.7*  HCT 24.5*   < > 26.6*   < > 27.8*   < > 30.9*   < >  21.3*   < > 22.0* 31.1* 29.6* 29.1* 28.9*  MCV 94.6  --  94.3  --  94.6  --  96.3  --  96.8  --   --   --   --   --   --   PLT 124*  --  139*  --  144*  --  159  --  129*  --   --   --   --   --   --    < > = values in this interval not displayed.   Basic Metabolic Panel: Recent Labs  Lab 09/02/19 0444 09/02/19 0444 09/03/19 0727 09/04/19 0851 09/01/2019 1542 08/28/2019 1833 09/06/19 0643  NA 138   < > 137 136 140 136 135  K 3.5   < > 3.6 4.5 3.5 3.4* 3.7  CL 103  --  101 100  --  101 102  CO2 24  --  25 21*  --  22 22  GLUCOSE 88  --  84 85  --  116* 117*  BUN <5*  --  13 18  --  8 10  CREATININE 2.70*  --  3.87* 4.81*  --  3.48* 3.82*  CALCIUM 7.2*  --  7.5* 7.6*  --  7.1* 7.1*  PHOS  --   --   --   --   --   --  3.4   < > = values in this interval not displayed.   GFR: Estimated Creatinine Clearance: 12.9 mL/min (A) (by C-G formula based on SCr of 3.82 mg/dL (H)). Liver Function Tests: Recent Labs  Lab 09/04/2019 1833 09/06/19 0643  AST 41  --   ALT 22  --   ALKPHOS 52  --   BILITOT 1.4*  --   PROT 4.3*  --   ALBUMIN 2.2* 2.0*   No results for input(s): LIPASE, AMYLASE in the last 168 hours. No results for input(s): AMMONIA in the last 168 hours. Coagulation Profile: Recent Labs  Lab 09/01/19 0158 09/03/19 0727 09/04/19 0851 08/28/2019 1833 09/06/19 0632  INR 1.4* 1.3* 1.3* 1.6* 1.4*   Cardiac Enzymes: No results for input(s): CKTOTAL, CKMB, CKMBINDEX, TROPONINI in the last 168 hours. BNP (last 3 results) No results for input(s): PROBNP in the last 8760 hours. HbA1C: No results for input(s): HGBA1C in the last 72 hours. CBG: Recent Labs  Lab 08/07/2019 1702 08/19/2019 1835 08/16/2019 2151 09/06/19 0234 09/06/19 0629  GLUCAP 104* 103* 119* 97 111*   Lipid Profile: No results for input(s): CHOL, HDL, LDLCALC, TRIG, CHOLHDL, LDLDIRECT in the last 72 hours. Thyroid Function Tests: No results for input(s): TSH, T4TOTAL, FREET4, T3FREE, THYROIDAB in the last 72  hours. Anemia Panel: No results for input(s): VITAMINB12, FOLATE, FERRITIN, TIBC, IRON, RETICCTPCT in the last 72 hours. Urine analysis:    Component Value Date/Time   COLORURINE YELLOW 01/04/2018 1524  APPEARANCEUR CLEAR 01/04/2018 1524   LABSPEC 1.010 01/04/2018 1524   PHURINE 8.5 (H) 01/04/2018 1524   GLUCOSEU NEGATIVE 01/04/2018 1524   HGBUR NEGATIVE 01/04/2018 1524   BILIRUBINUR NEGATIVE 01/04/2018 1524   KETONESUR NEGATIVE 01/04/2018 1524   PROTEINUR 100 (A) 01/04/2018 1524   UROBILINOGEN 0.2 10/30/2014 1801   NITRITE NEGATIVE 01/04/2018 1524   LEUKOCYTESUR SMALL (A) 01/04/2018 1524   Sepsis Labs: @LABRCNTIP (procalcitonin:4,lacticidven:4)  ) Recent Results (from the past 240 hour(s))  Respiratory Panel by RT PCR (Flu A&B, Covid) - Nasopharyngeal Swab     Status: None   Collection Time: 08/21/2019  3:17 PM   Specimen: Nasopharyngeal Swab  Result Value Ref Range Status   SARS Coronavirus 2 by RT PCR NEGATIVE NEGATIVE Final    Comment: (NOTE) SARS-CoV-2 target nucleic acids are NOT DETECTED. The SARS-CoV-2 RNA is generally detectable in upper respiratoy specimens during the acute phase of infection. The lowest concentration of SARS-CoV-2 viral copies this assay can detect is 131 copies/mL. A negative result does not preclude SARS-Cov-2 infection and should not be used as the sole basis for treatment or other patient management decisions. A negative result may occur with  improper specimen collection/handling, submission of specimen other than nasopharyngeal swab, presence of viral mutation(s) within the areas targeted by this assay, and inadequate number of viral copies (<131 copies/mL). A negative result must be combined with clinical observations, patient history, and epidemiological information. The expected result is Negative. Fact Sheet for Patients:  PinkCheek.be Fact Sheet for Healthcare Providers:    GravelBags.it This test is not yet ap proved or cleared by the [place] FDA and  has been authorized for detection and/or diagnosis of SARS-CoV-2 by FDA under an Emergency Use Authorization (EUA). This EUA will remain  in effect (meaning this test can be used) for the duration of the COVID-19 declaration under Section 564(b)(1) of the Act, 21 U.S.C. section 360bbb-3(b)(1), unless the authorization is terminated or revoked sooner.    Influenza A by PCR NEGATIVE NEGATIVE Final   Influenza B by PCR NEGATIVE NEGATIVE Final    Comment: (NOTE) The Xpert Xpress SARS-CoV-2/FLU/RSV assay is intended as an aid in  the diagnosis of influenza from Nasopharyngeal swab specimens and  should not be used as a sole basis for treatment. Nasal washings and  aspirates are unacceptable for Xpert Xpress SARS-CoV-2/FLU/RSV  testing. Fact Sheet for Patients: PinkCheek.be Fact Sheet for Healthcare Providers: GravelBags.it This test is not yet approved or cleared by the [place] FDA and  has been authorized for detection and/or diagnosis of SARS-CoV-2 by  FDA under an Emergency Use Authorization (EUA). This EUA will remain  in effect (meaning this test can be used) for the duration of the  Covid-19 declaration under Section 564(b)(1) of the Act, 21  U.S.C. section 360bbb-3(b)(1), unless the authorization is  terminated or revoked. Performed at [facility], [place] [address], [place], [place] [address]   MRSA PCR Screening     Status: None   Collection Time: 08/29/2019  9:21 PM   Specimen: Nasopharyngeal  Result Value Ref Range Status   MRSA by PCR NEGATIVE NEGATIVE Final    Comment:        The GeneXpert MRSA Assay (FDA approved for NASAL specimens only), is one component of a comprehensive MRSA colonization surveillance program. It is not intended to diagnose MRSA infection nor to guide or monitor  treatment for MRSA infections. Performed at [facility], [place] [address] [place], [place]  [address]       Radiology Studies: DG CHEST PORT 1 VIEW  Result Date: 08/23/2019 CLINICAL DATA:  Status post PICC placement. EXAM: PORTABLE CHEST 1 VIEW COMPARISON:  Radiograph 09/04/2019 FINDINGS: Right internal jugular central venous catheter tip in the lower SVC. No pneumothorax. No other central lines are visualized. Low lung volumes. Increasing bibasilar atelectasis from prior. Unchanged heart size and mediastinal contours with aortic atherosclerosis. No large pleural effusion. Reverse right shoulder arthroplasty. IMPRESSION: 1. Tip of the right internal jugular central venous catheter in the lower SVC. No pneumothorax. 2. Low lung volumes with increasing bibasilar atelectasis. Electronically Signed   By: Keith Rake M.D.   On: 08/18/2019 18:18     Scheduled Meds: . sodium chloride   Intravenous Once  . sodium chloride   Intravenous Once  . sodium chloride   Intravenous Once  . acetaminophen  650 mg Oral Q6H  . calcitRIOL  1 mcg Oral Q M,W,F-HD  . Chlorhexidine Gluconate Cloth  6 each Topical Q0600  . darbepoetin (ARANESP) injection - DIALYSIS  100 mcg Intravenous Q Wed-HD  . feeding supplement (NEPRO CARB STEADY)  237 mL Oral BID BM  . feeding supplement (PRO-STAT SUGAR FREE 64)  30 mL Oral BID  . insulin aspart  0-6 Units Subcutaneous TID WC  . levothyroxine  50 mcg Oral QAC breakfast  . multivitamin  1 tablet Oral QHS  . pantoprazole  40 mg Oral Q0600  . phytonadione  10 mg Oral Daily   Continuous Infusions: . sodium chloride    . sodium chloride    . sodium chloride    . sodium chloride 600 mL/hr at 08/08/2019 1649  . methocarbamol (ROBAXIN) IV       LOS: 10 days   Time Spent in minutes   45 minutes  Toyia Jelinek D.O. on 09/06/2019 at 9:10 AM  Between 7am to 7pm - Please see pager noted on [email or web address]  After 7pm go to [email or web address]  And look for the night  coverage person covering for me after hours  [facility] Office  [phone]

## 2019-09-06 NOTE — Progress Notes (Signed)
Pt refusing central line dressing to be changed. Ptyelling "No and Stop touching me". Will reassess and attempt again later.

## 2019-09-07 ENCOUNTER — Inpatient Hospital Stay (HOSPITAL_COMMUNITY): Payer: Medicare Other

## 2019-09-07 LAB — CBC
HCT: 28.4 % — ABNORMAL LOW (ref 36.0–46.0)
Hemoglobin: 9.6 g/dL — ABNORMAL LOW (ref 12.0–15.0)
MCH: 31.2 pg (ref 26.0–34.0)
MCHC: 33.8 g/dL (ref 30.0–36.0)
MCV: 92.2 fL (ref 80.0–100.0)
Platelets: 129 10*3/uL — ABNORMAL LOW (ref 150–400)
RBC: 3.08 MIL/uL — ABNORMAL LOW (ref 3.87–5.11)
RDW: 20.5 % — ABNORMAL HIGH (ref 11.5–15.5)
WBC: 7.8 10*3/uL (ref 4.0–10.5)
nRBC: 0 % (ref 0.0–0.2)

## 2019-09-07 LAB — BLOOD GAS, ARTERIAL
Acid-Base Excess: 1.1 mmol/L (ref 0.0–2.0)
Bicarbonate: 24.2 mmol/L (ref 20.0–28.0)
Drawn by: 205171
FIO2: 21
O2 Saturation: 94.4 %
Patient temperature: 37
pCO2 arterial: 32.5 mmHg (ref 32.0–48.0)
pH, Arterial: 7.486 — ABNORMAL HIGH (ref 7.350–7.450)
pO2, Arterial: 64.7 mmHg — ABNORMAL LOW (ref 83.0–108.0)

## 2019-09-07 LAB — RENAL FUNCTION PANEL
Albumin: 1.8 g/dL — ABNORMAL LOW (ref 3.5–5.0)
Anion gap: 16 — ABNORMAL HIGH (ref 5–15)
BUN: 5 mg/dL — ABNORMAL LOW (ref 8–23)
CO2: 23 mmol/L (ref 22–32)
Calcium: 7.2 mg/dL — ABNORMAL LOW (ref 8.9–10.3)
Chloride: 99 mmol/L (ref 98–111)
Creatinine, Ser: 2.67 mg/dL — ABNORMAL HIGH (ref 0.44–1.00)
GFR calc Af Amer: 20 mL/min — ABNORMAL LOW (ref >60)
GFR calc non Af Amer: 18 mL/min — ABNORMAL LOW (ref >60)
Glucose, Bld: 58 mg/dL — ABNORMAL LOW (ref 70–99)
Phosphorus: 2.5 mg/dL (ref 2.5–4.6)
Potassium: 3.6 mmol/L (ref 3.5–5.1)
Sodium: 138 mmol/L (ref 135–145)

## 2019-09-07 LAB — SURGICAL PATHOLOGY

## 2019-09-07 LAB — GLUCOSE, CAPILLARY
Glucose-Capillary: 49 mg/dL — ABNORMAL LOW (ref 70–99)
Glucose-Capillary: 50 mg/dL — ABNORMAL LOW (ref 70–99)
Glucose-Capillary: 147 mg/dL — ABNORMAL HIGH (ref 70–99)
Glucose-Capillary: 76 mg/dL (ref 70–99)
Glucose-Capillary: 75 mg/dL (ref 70–99)
Glucose-Capillary: 62 mg/dL — ABNORMAL LOW (ref 70–99)
Glucose-Capillary: 69 mg/dL — ABNORMAL LOW (ref 70–99)
Glucose-Capillary: 69 mg/dL — ABNORMAL LOW (ref 70–99)
Glucose-Capillary: 98 mg/dL (ref 70–99)

## 2019-09-07 LAB — PROTIME-INR
INR: 1.5 — ABNORMAL HIGH (ref 0.8–1.2)
Prothrombin Time: 18.1 seconds — ABNORMAL HIGH (ref 11.4–15.2)

## 2019-09-07 LAB — AMMONIA: Ammonia: 33 umol/L (ref 9–35)

## 2019-09-07 MED ORDER — BOOST / RESOURCE BREEZE PO LIQD CUSTOM: 1.0000 | Freq: Two times a day (BID) | ORAL | Status: DC

## 2019-09-07 MED ORDER — DEXTROSE 50 % IV SOLN: 12.5000 g | INTRAVENOUS | Status: AC

## 2019-09-07 MED ORDER — DEXTROSE 50 % IV SOLN
25.0000 g | INTRAVENOUS | Status: AC
  Administered 2019-09-07: 25 g via INTRAVENOUS

## 2019-09-07 MED ORDER — BOOST / RESOURCE BREEZE PO LIQD CUSTOM
1.0000 | Freq: Three times a day (TID) | ORAL | Status: DC
  Administered 2019-09-07: 1 via ORAL

## 2019-09-07 MED ORDER — PRO-STAT SUGAR FREE PO LIQD
30.0000 mL | Freq: Three times a day (TID) | ORAL | Status: DC
  Administered 2019-09-07: 30 mL via ORAL
  Filled 2019-09-07 (×3): qty 30

## 2019-09-07 MED ORDER — DEXTROSE 50 % IV SOLN
INTRAVENOUS | Status: AC
  Filled 2019-09-07: qty 50

## 2019-09-07 MED ORDER — DEXTROSE-NACL 5-0.45 % IV SOLN: INTRAVENOUS | Status: AC

## 2019-09-07 MED ORDER — DOCUSATE SODIUM 100 MG PO CAPS
100.0000 mg | ORAL_CAPSULE | Freq: Two times a day (BID) | ORAL | Status: DC
  Administered 2019-09-07: 100 mg via ORAL
  Filled 2019-09-07 (×4): qty 1

## 2019-09-07 MED ORDER — QUETIAPINE 12.5 MG HALF TABLET
12.5000 mg | ORAL_TABLET | Freq: Every day | ORAL | Status: DC
  Filled 2019-09-07: qty 1

## 2019-09-07 NOTE — Progress Notes (Signed)
PROGRESS NOTE    Roberta Bryant  DIY:[id number] DOB: January 16, 1951 DOA: 08/13/2019 PCP: [name], MD   Brief Narrative:  HPI on 09/01/2019 by [name], NP (ICU) 69 y/o F who presented to [facility] on 3/21 with reports of dark stools and altered mental status.    The patient was seen on 3/17 in the ER after she was noted to be altered staring off and not talking during HD. She reported feeling generalized weakness and missed HD on 3/15.  Only completed 50 minutes of HD on 3/17. Her Hgb at that time was 8.8. She reports she had bleeding from her fistula on Friday 3/19 that was brisk and difficult to control.   She returns to the ER on 3/21 with reports of black-red stools, weakness, abdominal pain.  EMS found her to have a BP of 90/50 and treated her with 636ml NS.  BP improved to 116/50.  She was awake/alert on arrival with SBP's remaining in the 90's.  Her last HD session was 3/19.  The patient's initial labs concerning for worsening anemia with Hgb of 5.4 (iSTAT).  She was treated with 2 units blood in the ER.  CXR was notable for bibasilar atelectasis and low lung volumes but no acute process.  She was FOBT positive on presentation with subsequent melanic stools in the ER.  GI, Nephrology consulted.    PCCM consulted for admission.    Interim history Patient admitted with GI bleed, status post EGD and colonoscopy found to have nonbleeding erosive gastropathy, peptic duodenitis, removal of 6 polyps from the right and left colon.  Pathology showed tubular adenomas.  She continued to have rectal bleeding, CTA 08/31/2019 with active bleeding in the hepatic flexure and had embolization done.  She continues to have intermittent rectal bleeding and requires blood transfusions.  Patient did have bleeding scan which is positive for bleeding at the hepatic flexure at the previous embolization site.  General surgery consulted after IR recommendation, status post right hemicolectomy. Assessment &  Plan   Recurrent diverticular GI bleeding/acute blood loss anemia/anemia of chronic kidney disease -Gastroenterology consult appreciated -Status post EGD 3/22: Nonbleeding erosive gastropathy, peptic duodenitis -Status post colonoscopy 3/23: 6 polyps removed from the right and left colon, left colon diverticulosis, no stigmata of bleeding.  Pathology shows tubular adenomas -Status post CTA which showed active bleeding at the hepatic flexure, underwent arteriogram and embolization on 08/31/2019 -Continued to have bleeding, NM bleeding scan obtained on 09/02/2019 which showed hepatic flexure bleeding again. 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 -Improved  End-stage renal disease -Patient dialyzes Monday, Wednesday, Friday -Nephrology consulted and appreciated  LUE aVF pulsatile with LUE larger than RUE ?CV Stenosis -Per nephrology, patient will need fistulogram once stable  Thrombocytopenia -have improved, 129 today -Need to monitor CBC  Elevated INR -Present on admission, has now improved, 1.5 today -Currently receiving vitamin K daily  Essential hypertension -BP has been soft, requiring patient to receive several IV fluid boluses as well as blood transfusion -Continue to monitor closely  Anxiety/depression -Continue Klonopin  Metabolic bone disease -Continue calcitriol  Diabetes mellitus,  type II -Hemoglobin A1c was 4.9 (08/23/2019) -Januvia currently held -Patient has had intermittent  hypoglycemia -Placed on insulin sliding scale with CBG monitoring  Hypothyroidism -Continue Synthroid  Coronary artery disease -Patient follows with cardiologist at [place] stress test was in 2019 showing an EF of 52%, low risk study -Currently no complaints of chest pain -Aspirin held due to GI bleed -Coreg held due to soft BP  Intermittent mild confusion/acute metabolic encephalopathy -Patient continues to have some confusion this more -Question whether this is due to GI bleeding versus other cause -Continue to monitor closely -Family also reports patient had intermittent confusion -Patient was confused this morning -Have ordered CT head -Chest x-ray unremarkable for infection -ABG within normal limits, mild hypoxia -Ammonia level pending  Goals of care -Palliative care consulted and appreciated  Deconditioning -Continue PT/OT-recommended SNF  Poor oral intake -Has had inadequate oral intake, likely due to GI function -Will continue to monitor -Nutrition consulted, continue supplements  DVT Prophylaxis  SCDs  Code Status: Full  Family Communication: None at bedside  Disposition Plan: Admitted from home for GI bleeding and anemia. S/p R hemicolectomy on 3/30. Pending further surgical recommendations and stabilization of hemoglobin. Continues to have AMS, pending workup. Suspect SNF when stable.   Consultants Neurosurgery Nephrology Gastroenterology Interventional radiology PCCM  Procedures  08/16/2019 EGD: Afew gastric polyps biopsied: hyperplastic polyp with eroded granulation tissue and surface reactive change. Mild, nonbleeding, erosive gastropathy biopsied:Mild reactive gastropathy with some surface hyperplastic change, no H. Pylori.Duodenal nodule biopsied:peptic duodenitis.   08/13/2019 colonoscopy: 6 polyps removed located in right and left colon. Left colon diverticulosis. Solitary rectal ulcer, no stigmata of bleeding. Pathology shows  tubular adenomas,noHGD. Ulcer not biopsied.   08/31/2019: IR arteriogram with bleeding in hepatic flexure, status post embolization  09/06/2019: Right hemicolectomy  Antibiotics   Anti-infectives (From admission, onward)   Start     Dose/Rate Route Frequency Ordered Stop   09/02/2019 1015  cefoTEtan (CEFOTAN) 2 g in sodium chloride 0.9 % 100 mL IVPB     2 g 200 mL/hr over 30 Minutes Intravenous  Once 08/15/2019 1002 08/07/2019 1600      Subjective:   Roberta Bryant seen and examined today.  Peers confused this morning.  Continues to state that she is tired and needs to rest.  Has no other complaints.    Objective:   Vitals:   09/07/19 0000 09/07/19 0400 09/07/19 0516 09/07/19 0518  BP: (!) 99/53  98/64   Pulse: (!) 103 (!) 101  95  Resp: 20 13  20   Temp:  99 F (37.2 C)    TempSrc:  Oral    SpO2: 100% 98%  96%  Weight:      Height:        Intake/Output Summary (Last 24 hours) at 09/07/2019 1006 Last data filed at 09/06/2019 2200 Gross per 24 hour  Intake 600 ml  Output 900 ml  Net -300 ml   Filed Weights   09/06/19 0603 09/06/19 1330 09/06/19 1725  Weight: 85.2 kg 84.5 kg 83.4 kg   Exam  General: Well developed, chronically ill-appearing, NAD  HEENT: NCAT, mucous membranes moist.   Cardiovascular: S1 S2 auscultated, RRR, + SEM  Respiratory: Clear to auscultation bilaterally with equal chest rise  Abdomen: Soft, obese, TTP around midline incision, nondistended, + bowel sounds, dressing in place.  Extremities: warm dry without cyanosis clubbing or edema  Neuro: AAO x1, nonfocal, mild slurring of speech  Data Reviewed: I have personally reviewed following labs  and imaging studies  CBC: Recent Labs  Lab 09/02/19 1235 09/02/19 1434 09/03/19 0250 09/03/19 0250 09/03/19 0727 09/03/19 1100 08/25/2019 0725 08/19/2019 1542 09/06/19 0233 09/06/19 0632 09/06/19 1430 09/06/19 2000 09/07/19 0516  WBC 7.0  --  7.6  --  7.6  --  7.9  --   --   --   --   --  7.8    HGB 8.6*   < > 9.1*   < > 9.9*   < > 6.8*   < > 9.8* 9.7* 9.8* 9.7* 9.6*  HCT 26.6*   < > 27.8*   < > 30.9*   < > 21.3*   < > 29.1* 28.9* 28.7* 28.6* 28.4*  MCV 94.3  --  94.6  --  96.3  --  96.8  --   --   --   --   --  92.2  PLT 139*  --  144*  --  159  --  129*  --   --   --   --   --  129*   < > = values in this interval not displayed.   Basic Metabolic Panel: Recent Labs  Lab 09/03/19 0727 09/03/19 0727 09/04/19 0851 08/30/2019 1542 08/26/2019 1833 09/06/19 0643 09/07/19 0516  NA 137   < > 136 140 136 135 138  K 3.6   < > 4.5 3.5 3.4* 3.7 3.6  CL 101  --  100  --  101 102 99  CO2 25  --  21*  --  22 22 23   GLUCOSE 84  --  85  --  116* 117* 58*  BUN 13  --  18  --  8 10 5*  CREATININE 3.87*  --  4.81*  --  3.48* 3.82* 2.67*  CALCIUM 7.5*  --  7.6*  --  7.1* 7.1* 7.2*  PHOS  --   --   --   --   --  3.4 2.5   < > = values in this interval not displayed.   GFR: Estimated Creatinine Clearance: 18.2 mL/min (A) (by C-G formula based on SCr of 2.67 mg/dL (H)). Liver Function Tests: Recent Labs  Lab 09/06/2019 1833 09/06/19 0643 09/07/19 0516  AST 41  --   --   ALT 22  --   --   ALKPHOS 52  --   --   BILITOT 1.4*  --   --   PROT 4.3*  --   --   ALBUMIN 2.2* 2.0* 1.8*   No results for input(s): LIPASE, AMYLASE in the last 168 hours. No results for input(s): AMMONIA in the last 168 hours. Coagulation Profile: Recent Labs  Lab 09/03/19 0727 09/04/19 0851 09/04/2019 1833 09/06/19 0632 09/07/19 0516  INR 1.3* 1.3* 1.6* 1.4* 1.5*   Cardiac Enzymes: No results for input(s): CKTOTAL, CKMB, CKMBINDEX, TROPONINI in the last 168 hours. BNP (last 3 results) No results for input(s): PROBNP in the last 8760 hours. HbA1C: No results for input(s): HGBA1C in the last 72 hours. CBG: Recent Labs  Lab 09/06/19 2056 09/07/19 0522 09/07/19 0524 09/07/19 0535 09/07/19 0823  GLUCAP 84 50* 49* 147* 76   Lipid Profile: No results for input(s): CHOL, HDL, LDLCALC, TRIG, CHOLHDL,  LDLDIRECT in the last 72 hours. Thyroid Function Tests: No results for input(s): TSH, T4TOTAL, FREET4, T3FREE, THYROIDAB in the last 72 hours. Anemia Panel: No results for input(s): VITAMINB12, FOLATE, FERRITIN, TIBC, IRON, RETICCTPCT in the last 72 hours. Urine analysis:  Component Value Date/Time   COLORURINE YELLOW 01/04/2018 1524   APPEARANCEUR CLEAR 01/04/2018 1524   LABSPEC 1.010 01/04/2018 1524   PHURINE 8.5 (H) 01/04/2018 1524   GLUCOSEU NEGATIVE 01/04/2018 1524   HGBUR NEGATIVE 01/04/2018 1524   [place] 01/04/2018 1524   KETONESUR NEGATIVE 01/04/2018 1524   PROTEINUR 100 (A) 01/04/2018 1524   UROBILINOGEN 0.2 10/30/2014 1801   NITRITE NEGATIVE 01/04/2018 1524   LEUKOCYTESUR SMALL (A) 01/04/2018 1524   Sepsis Labs: @LABRCNTIP (procalcitonin:4,lacticidven:4)  ) No results found for this or any previous visit (from the past 240 hour(s)).    Radiology Studies: DG CHEST PORT 1 VIEW  Result Date: 09/07/2019 CLINICAL DATA:  Altered mental status EXAM: PORTABLE CHEST 1 VIEW COMPARISON:  08/27/2019 FINDINGS: Right IJ central line is unchanged. Low lung volumes. Persistent left basilar atelectasis. No pleural effusion or pneumothorax. Stable cardiomediastinal contours. Partially imaged right reverse shoulder arthroplasty. IMPRESSION: No substantial change.  Persistent left basilar atelectasis. Electronically Signed   By: Macy Mis M.D.   On: 09/07/2019 08:32   DG CHEST PORT 1 VIEW  Result Date: 09/01/2019 CLINICAL DATA:  Status post PICC placement. EXAM: PORTABLE CHEST 1 VIEW COMPARISON:  Radiograph 08/24/2019 FINDINGS: Right internal jugular central venous catheter tip in the lower SVC. No pneumothorax. No other central lines are visualized. Low lung volumes. Increasing bibasilar atelectasis from prior. Unchanged heart size and mediastinal contours with aortic atherosclerosis. No large pleural effusion. Reverse right shoulder arthroplasty. IMPRESSION: 1. Tip of  the right internal jugular central venous catheter in the lower SVC. No pneumothorax. 2. Low lung volumes with increasing bibasilar atelectasis. Electronically Signed   By: Keith Rake M.D.   On: 09/04/2019 18:18     Scheduled Meds: . sodium chloride   Intravenous Once  . sodium chloride   Intravenous Once  . sodium chloride   Intravenous Once  . acetaminophen  650 mg Oral Q6H  . calcitRIOL  1 mcg Oral Q M,W,F-HD  . Chlorhexidine Gluconate Cloth  6 each Topical Q0600  . darbepoetin (ARANESP) injection - DIALYSIS  100 mcg Intravenous Q Wed-HD  . feeding supplement (NEPRO CARB STEADY)  237 mL Oral BID BM  . feeding supplement (PRO-STAT SUGAR FREE 64)  30 mL Oral BID  . insulin aspart  0-6 Units Subcutaneous TID WC  . levothyroxine  50 mcg Oral QAC breakfast  . multivitamin  1 tablet Oral QHS  . pantoprazole  40 mg Oral Q0600  . phytonadione  10 mg Oral Daily  . sodium chloride flush  10-40 mL Intracatheter Q12H   Continuous Infusions: . sodium chloride    . sodium chloride    . sodium chloride    . sodium chloride 600 mL/hr at 09/04/2019 1649  . methocarbamol (ROBAXIN) IV       LOS: 11 days   Time Spent in minutes   45 minutes  Olanda Downie D.O. on 09/07/2019 at 10:06 AM  Between 7am to 7pm - Please see pager noted on [email or web address]  After 7pm go to [email or web address]  And look for the night coverage person covering for me after hours  [facility] Office  [phone]

## 2019-09-07 NOTE — Progress Notes (Signed)
RN paged MD to advise pt would not swallow pills. RN unable to complete medication administration. MD also advise of blood sugars. New orders being placed.  Awaiting orders. RN will continue to monitor.

## 2019-09-07 NOTE — Progress Notes (Signed)
Physical Therapy Treatment Patient Details Name: Roberta Bryant MRN: [id number] DOB: [birth year] Today's Date: 09/07/2019    History of Present Illness Pt is a 69 y/o female admitted secondary to acute blood loss anemia, likely from GI source. Pt is s/p endoscopy and s/p colonoscopy with cold snare polypectomy X6. PMH includes ESRD on HD MWF and GERD.     PT Comments    Patient seen for mobiltiy progression. Pt responding with head nods throughout session and agreeable to OOB mobility. Pt requires max-total A +2 for bed mobility and functional transfer training. Continue to progress as tolerated with anticipated d/c to SNF for further skilled PT services.     Follow Up Recommendations  SNF;Supervision/Assistance - 24 hour     Equipment Recommendations  Hospital bed;Other (comment)(hoyer lift, hoyer pad)    Recommendations for Other Services       Precautions / Restrictions Precautions Precautions: Fall Restrictions Weight Bearing Restrictions: No    Mobility  Bed Mobility Overal bed mobility: Needs Assistance Bed Mobility: Rolling;Supine to Sit Rolling: Max assist   Supine to sit: Max assist;+2 for physical assistance;+2 for safety/equipment     General bed mobility comments: multimodal cues for sequencing  Transfers Overall transfer level: Needs assistance   Transfers: Lateral/Scoot Transfers          Lateral/Scoot Transfers: Max assist;Total assist;+2 physical assistance;+2 safety/equipment General transfer comment: cues for sequencing, leaning anteriorly, and for hand placement; +2 assist with use of bed pad to drop arm recliner   Ambulation/Gait                 Stairs             Wheelchair Mobility    Modified Rankin (Stroke Patients Only)       Balance Overall balance assessment: Needs assistance Sitting-balance support: No upper extremity supported;Feet supported Sitting balance-Leahy Scale: Fair                                       Cognition Arousal/Alertness: Awake/alert Behavior During Therapy: Flat affect Overall Cognitive Status: Impaired/Different from baseline Area of Impairment: Following commands;Problem solving                       Following Commands: Follows one step commands with increased time;Follows one step commands inconsistently     Problem Solving: Difficulty sequencing;Decreased initiation;Requires verbal cues;Slow processing;Requires tactile cues General Comments: pt nonverbal during session; nodding yes and no ~50% of the time       Exercises      General Comments        Pertinent Vitals/Pain Pain Assessment: Faces Faces Pain Scale: Hurts little more Pain Location: grimacing with mobility but did not specify source Pain Descriptors / Indicators: Grimacing;Guarding;Tender Pain Intervention(s): Monitored during session;Repositioned    Home Living                      Prior Function            PT Goals (current goals can now be found in the care plan section) Progress towards PT goals: Not progressing toward goals - comment    Frequency    Min 2X/week      PT Plan Current plan remains appropriate    Co-evaluation              AM-PAC PT "6 Clicks" Mobility  Outcome Measure  Help needed turning from your back to your side while in a flat bed without using bedrails?: A Lot Help needed moving from lying on your back to sitting on the side of a flat bed without using bedrails?: A Lot Help needed moving to and from a bed to a chair (including a wheelchair)?: Total Help needed standing up from a chair using your arms (e.g., wheelchair or bedside chair)?: Total Help needed to walk in hospital room?: Total Help needed climbing 3-5 steps with a railing? : Total 6 Click Score: 8    End of Session   Activity Tolerance: Patient tolerated treatment well Patient left: with call bell/phone within reach;in chair;with family/visitor  present Nurse Communication: Mobility status PT Visit Diagnosis: Unsteadiness on feet (R26.81);Muscle weakness (generalized) (M62.81);Difficulty in walking, not elsewhere classified (R26.2)     Time: 1219-7588 PT Time Calculation (min) (ACUTE ONLY): 21 min  Charges:  $Therapeutic Activity: 8-22 mins                     Earney Navy, PTA Acute Rehabilitation Services Pager: [phone] Office: [phone]     Darliss Cheney 09/07/2019, 4:27 PM

## 2019-09-07 NOTE — Progress Notes (Signed)
Patient ID: Roberta Bryant, female   DOB: 1951-03-10, 69 y.o.   MRN: [id number]  This NP visited patient at the bedside as a follow up for palliative medicine needs and emotional support..  Discussed patient's care with bedside RN.  Patient is lethargic and intermittently confused.  Please call to son [name] and left voicemail, await callback.    PMT to continue to support holistically.  Patient is high risk for decompensation, this nurse practitioner hopes to continue conversation with patient and family regarding overall plan of care, treatment options, advanced directive decisions, ensuring decisions are within the context of the patient's values and goals of care.  This nurse practitioner will be out of the hospital until Monday morning.  If the patient is still hospitalized I will follow-up at that time.    Call palliative medicine team phone # [phone] with questions or concerns in the interim  No charge  Wadie Lessen NP  Palliative Medicine Team Team Phone # [phone] Pager [phone]

## 2019-09-07 NOTE — Progress Notes (Signed)
Chaplain engaged in follow-up visit with Roberta Bryant.  Rameen asked chaplain to grab her nurse so that she could sit up and use the bathroom.  Chaplain went to find help for patient.  Chaplain will visit another time when Wednesday is able to talk.  Chaplain assessed her physical discomfort and need to move around the bed.

## 2019-09-07 NOTE — Progress Notes (Signed)
[facility] Progress Note   Subjective:  Seen in room. Completed HD yesterday with 0.9L removed. Feels "meh" today. Denies CP, SOB, N/V. No visual hallucinations today.   Objective Vitals:   09/07/19 0000 09/07/19 0400 09/07/19 0516 09/07/19 0518  BP: (!) 99/53  98/64   Pulse: (!) 103 (!) 101  95  Resp: 20 13  20   Temp:  99 F (37.2 C)    TempSrc:  Oral    SpO2: 100% 98%  96%  Weight:      Height:        Weight change: -0.7 kg   Additional Objective Labs: Basic Metabolic Panel: Recent Labs  Lab 08/30/2019 1833 09/06/19 0643 09/07/19 0516  NA 136 135 138  K 3.4* 3.7 3.6  CL 101 102 99  CO2 22 22 23   GLUCOSE 116* 117* 58*  BUN 8 10 5*  CREATININE 3.48* 3.82* 2.67*  CALCIUM 7.1* 7.1* 7.2*  PHOS  --  3.4 2.5   CBC: Recent Labs  Lab 09/02/19 1235 09/02/19 1434 09/03/19 0250 09/03/19 0250 09/03/19 0727 09/03/19 1100 09/04/2019 0725 08/17/2019 1542 09/06/19 1430 09/06/19 2000 09/07/19 0516  WBC 7.0   < > 7.6   < > 7.6  --  7.9  --   --   --  7.8  HGB 8.6*   < > 9.1*   < > 9.9*   < > 6.8*   < > 9.8* 9.7* 9.6*  HCT 26.6*   < > 27.8*   < > 30.9*   < > 21.3*   < > 28.7* 28.6* 28.4*  MCV 94.3  --  94.6  --  96.3  --  96.8  --   --   --  92.2  PLT 139*   < > 144*   < > 159  --  129*  --   --   --  129*   < > = values in this interval not displayed.   Blood Culture    Component Value Date/Time   SDES URINE, CLEAN CATCH 01/04/2018 1524   SPECREQUEST  01/04/2018 1524    NONE Performed at [facility] [address] [place], [place] [address]    CULT MULTIPLE SPECIES PRESENT, SUGGEST RECOLLECTION (A) 01/04/2018 1524   REPTSTATUS 01/05/2018 FINAL 01/04/2018 1524     Physical Exam General: Obese female in bed, NAD  Heart: Tachy, systolic murmur  Lungs: Clear bilaterally  Abdomen: obese, soft diffuse R sided tenderness  Extremities: Non pitting edema bilaterally  Dialysis Access: LUE AVF +bruit   Medications: . sodium chloride    .  sodium chloride    . sodium chloride    . sodium chloride 600 mL/hr at 08/19/2019 1649  . methocarbamol (ROBAXIN) IV     . sodium chloride   Intravenous Once  . sodium chloride   Intravenous Once  . sodium chloride   Intravenous Once  . acetaminophen  650 mg Oral Q6H  . calcitRIOL  1 mcg Oral Q M,W,F-HD  . Chlorhexidine Gluconate Cloth  6 each Topical Q0600  . darbepoetin (ARANESP) injection - DIALYSIS  100 mcg Intravenous Q Wed-HD  . feeding supplement (NEPRO CARB STEADY)  237 mL Oral BID BM  . feeding supplement (PRO-STAT SUGAR FREE 64)  30 mL Oral BID  . insulin aspart  0-6 Units Subcutaneous TID WC  . levothyroxine  50 mcg Oral QAC breakfast  . multivitamin  1 tablet Oral QHS  . pantoprazole  40 mg  Oral Q0600  . phytonadione  10 mg Oral Daily  . sodium chloride flush  10-40 mL Intracatheter Q12H    Dialysis Orders: [facility] MWF 3.5 hr EDW 85 400/A 1.5 3 K 2.25 Ca 36 degrees profile 4 var Na 148 linear left upper AVF heparin 4000 with 1500 mid tmt Mircera 100 q 2 wks -last had 60 on 3/10 calcitriol 1 Binders: fosrenol 2 gm ac  Assessment/Plan: 1. ABLA 2/2 recurrent diverticular GI bleeding - s/p colonoscopy -->6 polyps removed/non bleeding rectal ulcer. Bleeding recurred and now s/p IR embolization.  Bleeding scan 3/28  + active bleed RUQ near hepatic flexure - same area of prior embolization. Ongoing bloody stools. S/p 6 units prbcs-->>GI/CCS following -s/p right hemicolectomy 3/30 2. ESRD- HD MWF. No heparin on HD. Next HD 4/2.  3. AVF issues -recent bleeding issues with access - no heparin HD.  Arm remains swollen but AVF functional. Per dialysis RN, no further bleeding issues noted as of 3/31 4. Hypotension/volume- BPlow/stable. UF as tolerated. Using alb, prn to support BP. Is now below her outpatient dry weight.  5. Anemia  Hgb trending down d/t ongoing GI bleed. S/p 6 units prbcs -last 3/30. Hgb 9.7. Continue Aranesp 138mcg q Wed. 6. Metabolic bone disease- Phos low  without binders/Corr Ca ok.  On Calcitriol for Ca support.  7. Severe malnutrition- Alb < 2.  Continue pro-stat + nepro. 8. CAD - Coreg on hold d/t hypotension  9. DM - Insulin per primary  10. GOC - for d/c to SNF when medically stable -  Lynnda Child PA-C [facility] Pager [phone] 09/07/2019,8:42 AM  LOS: 11 days

## 2019-09-07 NOTE — Progress Notes (Signed)
2 Days Post-Op  Subjective: CC: Not very talkative today. States that she needs to use the restroom and feels like she needs to have a BM. She denies any n/v and is tolerating cld. She is unable to tell me how much she has drank/taken in over the last 24 hours. She denies flatus or bm.   Objective: Vital signs in last 24 hours: Temp:  [97.8 F (36.6 C)-99 F (37.2 C)] 99 F (37.2 C) (04/01 0400) Pulse Rate:  [91-108] 95 (04/01 0518) Resp:  [0-23] 20 (04/01 0518) BP: (86-126)/(41-84) 98/64 (04/01 0516) SpO2:  [95 %-100 %] 96 % (04/01 0518) Weight:  [83.4 kg-84.5 kg] 83.4 kg (03/31 1725) Last BM Date: 09/06/19  Intake/Output from previous day: 03/31 0701 - 04/01 0700 In: 840 [P.O.:600; NG/GT:240] Out: 900  Intake/Output this shift: No intake/output data recorded.  PE: Gen:  Alert, NAD, pleasant Pulm: Normal rate and effort  Abd: Soft, ND, appropriately tender around midline incision L>R, hypoactive bowel sounds. Honeycomb dressing in place with small amount of dried blood noted on dressing. Staples underneath appear c/d/i Skin: no rashes noted, warm and dry  Lab Results:  Recent Labs    08/29/2019 0725 08/09/2019 1542 09/06/19 2000 09/07/19 0516  WBC 7.9  --   --  7.8  HGB 6.8*   < > 9.7* 9.6*  HCT 21.3*   < > 28.6* 28.4*  PLT 129*  --   --  129*   < > = values in this interval not displayed.   BMET Recent Labs    09/06/19 0643 09/07/19 0516  NA 135 138  K 3.7 3.6  CL 102 99  CO2 22 23  GLUCOSE 117* 58*  BUN 10 5*  CREATININE 3.82* 2.67*  CALCIUM 7.1* 7.2*   PT/INR Recent Labs    09/06/19 0632 09/07/19 0516  LABPROT 17.2* 18.1*  INR 1.4* 1.5*   CMP     Component Value Date/Time   NA 138 09/07/2019 0516   K 3.6 09/07/2019 0516   CL 99 09/07/2019 0516   CO2 23 09/07/2019 0516   GLUCOSE 58 (L) 09/07/2019 0516   BUN 5 (L) 09/07/2019 0516   CREATININE 2.67 (H) 09/07/2019 0516   CALCIUM 7.2 (L) 09/07/2019 0516   CALCIUM 7.4 (L) 09/12/2008 0615   PROT 4.3 (L) 08/12/2019 1833   ALBUMIN 1.8 (L) 09/07/2019 0516   AST 41 09/04/2019 1833   ALT 22 08/21/2019 1833   ALKPHOS 52 08/18/2019 1833   BILITOT 1.4 (H) 08/31/2019 1833   GFRNONAA 18 (L) 09/07/2019 0516   GFRAA 20 (L) 09/07/2019 0516   Lipase     Component Value Date/Time   LIPASE 43 07/19/2018 2330       Studies/Results: DG CHEST PORT 1 VIEW  Result Date: 09/07/2019 CLINICAL DATA:  Altered mental status EXAM: PORTABLE CHEST 1 VIEW COMPARISON:  09/02/2019 FINDINGS: Right IJ central line is unchanged. Low lung volumes. Persistent left basilar atelectasis. No pleural effusion or pneumothorax. Stable cardiomediastinal contours. Partially imaged right reverse shoulder arthroplasty. IMPRESSION: No substantial change.  Persistent left basilar atelectasis. Electronically Signed   By: Macy Mis M.D.   On: 09/07/2019 08:32   DG CHEST PORT 1 VIEW  Result Date: 09/04/2019 CLINICAL DATA:  Status post PICC placement. EXAM: PORTABLE CHEST 1 VIEW COMPARISON:  Radiograph 08/15/2019 FINDINGS: Right internal jugular central venous catheter tip in the lower SVC. No pneumothorax. No other central lines are visualized. Low lung volumes. Increasing bibasilar atelectasis from prior.  Unchanged heart size and mediastinal contours with aortic atherosclerosis. No large pleural effusion. Reverse right shoulder arthroplasty. IMPRESSION: 1. Tip of the right internal jugular central venous catheter in the lower SVC. No pneumothorax. 2. Low lung volumes with increasing bibasilar atelectasis. Electronically Signed   By: Keith Rake M.D.   On: 08/27/2019 18:18    Anti-infectives: Anti-infectives (From admission, onward)   Start     Dose/Rate Route Frequency Ordered Stop   08/31/2019 1015  cefoTEtan (CEFOTAN) 2 g in sodium chloride 0.9 % 100 mL IVPB     2 g 200 mL/hr over 30 Minutes Intravenous  Once 08/18/2019 1002 09/04/2019 1600       Assessment/Plan ESRD - dialysis on MWF History of  CHF HTN CAD History of stroke DM2 Deconditioning HxCovid about 8 weeks ago Protein Calorie Malnutrition - Per-Alb 8.6  Lower GI bleed ABL Anemia - s/p right hemicolectomy - Dr. Ninfa Linden - 09/04/2019 - POD #2 - Cont CLD today while awaiting ROBF. Will consider starting TPN tomorrow if bowel function does not return (given prolonged duration without adequate oral intake prior to surgery and low pre-alb). - Hgb stable at 9.6 this AM from 9.7 yesterday. Cont to trend - Mobilize, PT. PT currently recommending SNF - Pulm toilet, IS  FEN -CLD. AROBF VTE -SCDs, hold chemical prophylaxis for now. Monitor hgb. May be able to start chemical prophylaxis in the next 24-48 hours if hgb remains stable.  ID - Cefotetan peri-op Foley - None Follow up - Dr. Ninfa Linden     LOS: 11 days    Jillyn Ledger , [facility] 09/07/2019, 8:55 AM Please see Amion for pager number during day hours 7:00am-4:30pm

## 2019-09-07 NOTE — Progress Notes (Signed)
Pt verbally and physically aggressive. Refusing care and medications at this time. VSS. No s/sx of distress. Will continue to monitor.

## 2019-09-07 NOTE — Progress Notes (Signed)
Pt lethargic and minimally responsive to stimuli. Pt unable to awaken enough to take PO meds. BP soft and RR 14. Previous RN stated that Md aware and head CT was done today for change in status. Will continue to monitor and assess more frequently for any additional changes.

## 2019-09-07 NOTE — Progress Notes (Signed)
Nutrition Follow-up  DOCUMENTATION CODES:   Obesity unspecified  INTERVENTION:   Recommend initiation of TPN if diet not able to advance within next 24 hrs or bowel function is slow to return.   -Continue 30 ml Prostat TID, each supplement provides 100 kcals and 15 grams protein -Continue renal MVI daily -Add Boost Breeze po BID, each supplement provides 250 kcal and 9 grams of protein  NUTRITION DIAGNOSIS:   Inadequate oral intake related to altered GI function as evidenced by meal completion < 25%.  Ongoing  GOAL:   Patient will meet greater than or equal to 90% of their needs  Not meeting   MONITOR:   PO intake, Supplement acceptance, Diet advancement, Labs, Weight trends, Skin, I & O's  REASON FOR ASSESSMENT:   Malnutrition Screening Tool    ASSESSMENT:   69 y/o F who presented to [facility] on 3/21 with reports of dark stools and altered mental status.  3/22- s/p upper endoscopy- revealed gastric polyps (biopsied), mild erosive gastropathy with no bleeding (biopsied), nodule in duodenum (biopsied) 3/23- s/p colonoscopy  3/25- s/p Visceral arteriogram and embolization 3/30- s/p R hemicolectomy   Pt tolerating clears. Appetite is slowly progressing. Last meal completion charted as 50%. Awaiting bowel return prior to diet advancement. Pt is day 10 without adequate nutrition. Recommend nutrition support if diet unable to advance in next 24 hours.   EDW: 85 kg Current weight: 83.4 kg   I/O: +2,798 ml since admit  Last HD yesterday: 900 ml net UF  Medications: calcitriol, aranesp, SS novolog, rena-vit, Vit K Labs: CBG 76-147  Diet Order:   Diet Order            Diet clear liquid Room service appropriate? Yes; Fluid consistency: Thin  Diet effective now              EDUCATION NEEDS:   No education needs have been identified at this time  Skin:  Skin Assessment: Skin Integrity Issues: Skin Integrity Issues:: Incisions Incisions: abdomen  Last BM:   3/30  Height:   Ht Readings from Last 1 Encounters:  08/26/2019 4\' 10"  (1.473 m)    Weight:   Wt Readings from Last 1 Encounters:  09/06/19 83.4 kg    Ideal Body Weight:  44.1 kg  BMI:  Body mass index is 38.43 kg/m.  Estimated Nutritional Needs:   Kcal:  1900-2100  Protein:  90-105 grams  Fluid:  1000 ml + UOP   Mariana Single RD, LDN Clinical Nutrition Pager listed in [place]

## 2019-09-07 DEATH — deceased

## 2019-09-08 DIAGNOSIS — R4182 Altered mental status, unspecified: Secondary | ICD-10-CM

## 2019-09-08 LAB — HEMOGLOBIN AND HEMATOCRIT, BLOOD
HCT: 28.7 % — ABNORMAL LOW (ref 36.0–46.0)
Hemoglobin: 9.5 g/dL — ABNORMAL LOW (ref 12.0–15.0)

## 2019-09-08 LAB — GLUCOSE, CAPILLARY
Glucose-Capillary: 118 mg/dL — ABNORMAL HIGH (ref 70–99)
Glucose-Capillary: 112 mg/dL — ABNORMAL HIGH (ref 70–99)
Glucose-Capillary: 147 mg/dL — ABNORMAL HIGH (ref 70–99)
Glucose-Capillary: 123 mg/dL — ABNORMAL HIGH (ref 70–99)
Glucose-Capillary: 117 mg/dL — ABNORMAL HIGH (ref 70–99)
Glucose-Capillary: 150 mg/dL — ABNORMAL HIGH (ref 70–99)

## 2019-09-08 LAB — COMPREHENSIVE METABOLIC PANEL
ALT: 39 U/L (ref 0–44)
AST: 125 U/L — ABNORMAL HIGH (ref 15–41)
Albumin: 1.6 g/dL — ABNORMAL LOW (ref 3.5–5.0)
Alkaline Phosphatase: 85 U/L (ref 38–126)
Anion gap: 15 (ref 5–15)
BUN: 7 mg/dL — ABNORMAL LOW (ref 8–23)
CO2: 23 mmol/L (ref 22–32)
Calcium: 7.3 mg/dL — ABNORMAL LOW (ref 8.9–10.3)
Chloride: 100 mmol/L (ref 98–111)
Creatinine, Ser: 3.63 mg/dL — ABNORMAL HIGH (ref 0.44–1.00)
GFR calc Af Amer: 14 mL/min — ABNORMAL LOW (ref >60)
GFR calc non Af Amer: 12 mL/min — ABNORMAL LOW (ref >60)
Glucose, Bld: 136 mg/dL — ABNORMAL HIGH (ref 70–99)
Potassium: 3.5 mmol/L (ref 3.5–5.1)
Sodium: 138 mmol/L (ref 135–145)
Total Bilirubin: 1.5 mg/dL — ABNORMAL HIGH (ref 0.3–1.2)
Total Protein: 3.8 g/dL — ABNORMAL LOW (ref 6.5–8.1)

## 2019-09-08 LAB — PREALBUMIN: Prealbumin: 5.8 mg/dL — ABNORMAL LOW (ref 18–38)

## 2019-09-08 MED ORDER — TRAVASOL 10 % IV SOLN
INTRAVENOUS | Status: AC
  Filled 2019-09-08: qty 533.76

## 2019-09-08 MED ORDER — LIDOCAINE-PRILOCAINE 2.5-2.5 % EX CREA
1.0000 "application " | TOPICAL_CREAM | CUTANEOUS | Status: DC | PRN
  Filled 2019-09-08: qty 5

## 2019-09-08 MED ORDER — HEPARIN SODIUM (PORCINE) 1000 UNIT/ML DIALYSIS: 1000.0000 [IU] | INTRAMUSCULAR | Status: DC | PRN

## 2019-09-08 MED ORDER — ALTEPLASE 2 MG IJ SOLR: 2.0000 mg | Freq: Once | INTRAMUSCULAR | Status: DC | PRN

## 2019-09-08 MED ORDER — DEXTROSE-NACL 5-0.45 % IV SOLN: INTRAVENOUS | Status: DC

## 2019-09-08 MED ORDER — THIAMINE HCL 100 MG/ML IJ SOLN
500.0000 mg | Freq: Three times a day (TID) | INTRAVENOUS | Status: AC
  Administered 2019-09-08 – 2019-09-11 (×7): 500 mg via INTRAVENOUS
  Filled 2019-09-08 (×9): qty 5

## 2019-09-08 MED ORDER — SODIUM CHLORIDE 0.9 % IV BOLUS
250.0000 mL | Freq: Once | INTRAVENOUS | Status: AC
  Administered 2019-09-08: 03:00:00 250 mL via INTRAVENOUS

## 2019-09-08 MED ORDER — PENTAFLUOROPROP-TETRAFLUOROETH EX AERO
1.0000 "application " | INHALATION_SPRAY | CUTANEOUS | Status: DC | PRN
  Filled 2019-09-08: qty 116

## 2019-09-08 MED ORDER — SODIUM CHLORIDE 0.9 % IV SOLN: 100.0000 mL | INTRAVENOUS | Status: DC | PRN

## 2019-09-08 MED ORDER — INSULIN ASPART 100 UNIT/ML ~~LOC~~ SOLN
0.0000 [IU] | SUBCUTANEOUS | Status: DC
  Administered 2019-09-09 – 2019-09-12 (×15): 1 [IU] via SUBCUTANEOUS

## 2019-09-08 MED ORDER — ALBUMIN HUMAN 25 % IV SOLN
INTRAVENOUS | Status: AC
  Filled 2019-09-08: qty 100

## 2019-09-08 MED ORDER — LIDOCAINE HCL (PF) 1 % IJ SOLN: 5.0000 mL | INTRAMUSCULAR | Status: DC | PRN

## 2019-09-08 NOTE — Progress Notes (Signed)
After 1.25 hrs into dialysis treatment, patient became hard to arouse.  B/P reading of 75/42.  UF goal was set for 1L due to systolic being in the low 100's at initiation of treatment.  Juanell Fairly, NP was at the bedside at the time of hypotension episode.  Orders received to discontinue treatment and give an additional 250 NS bolus. Rapid response was called due to patient scheduled to move to 6E from [facility] after dialysis treatment.  Upon assessment and talking with medical team decision was made to continue transfer to [place].  Post discontinuing treatment patient became more easy to arouse but still unable to follow command.  Patient post b/p 91/42.  Report called to primary RN on 6E.  Patient transported and accompanied to 6E by rapid response Laruth Bouchard, RN.

## 2019-09-08 NOTE — Plan of Care (Signed)
  Problem: Clinical Measurements: Goal: Will remain free from infection Outcome: Progressing Goal: Cardiovascular complication will be avoided Outcome: Progressing   Problem: Elimination: Goal: Will not experience complications related to urinary retention Outcome: Progressing   Problem: Safety: Goal: Ability to remain free from injury will improve Outcome: Progressing

## 2019-09-08 NOTE — Progress Notes (Signed)
PHARMACY - TOTAL PARENTERAL NUTRITION CONSULT NOTE   Indication: Prolonged ileus  Patient Measurements: Height: 4\' 10"  (147.3 cm) Weight: 86.6 kg (190 lb 14.7 oz) IBW/kg (Calculated) : 40.9 TPN AdjBW (KG): 52.3 Body mass index is 39.9 kg/m. Usual Weight: 85kg  Assessment:  66 yof admitted with ABLA in the setting of GIB. She required and EGD and colonoscopy found to have a diverticular bleed. Ultimately underwent a R hemicolectomy. To start TPN POD#3 as bowel function has not returned due to prolonged ileus.   Glucose / Insulin: Some hypoglycemia, hasn't required any SSI Electrolytes: K 3.5 (goal >/= 4), others WNL Renal: ESRD on HD MWF LFTs / TGs: Tbili 1.5, AST 125 Prealbumin / albumin: Albumin 1.6, prealbumin 5.8 Intake / Output; MIVF: D51/2NS at 44ml/hr GI Imaging: CT abd 3/25 - Focus of active GI bleeding at hepatic flexure of colon, which demonstrates wall thickening which could be the result of infection, ischemia, or inflammatory bowel disease. Additional thickened small bowel loop in the LEFT mid abdomen question enteritis, associated with dilatation of small bowel loops proximal to this site suggesting a component of obstruction. Question gastric polyps versus redundant mucosa; recommend correlation with prior endoscopy. Small amount of nonspecific perihepatic free fluid. Surgeries / Procedures:  3/22 EGD 3/23 Colonoscopy 3/30 Partial colectomy  Central access: CVC  TPN start date: 4/2  Nutritional Goals (per RD recommendation on 4/1): kCal: 1900-2100, Protein: 90-105 Goal TPN rate is 75 mL/hr (provides 100 g of protein and 2028 kcals per day)  Current Nutrition:  Clear liquids  Plan:  Start TPN at 53mL/hr at 1800 - provides 53g protein, 34g lipids, 156g dextrose and 1081kcal meeting approximately 58% patient goals Electrolytes in TPN: 28mEq/L of Na, 45mEq/L of K, 41mEq/L of Ca, 61mEq/L of Mg, and 64mmol/L of Phos. Cl:Ac ratio 1:1 Add standard MVI and trace  elements to TPN - remove chromium due to ESRD on HD Continue SSI q4h and adjust as needed  Reduce MIVF to 35 mL/hr at 1800 Monitor TPN labs on Mon/Thurs and tomorrow Hold off on potassium supplementation since pt is ESRD and will receive potassium with her TPN tonight  Roberta Bryant, Roberta Bryant 09/08/2019,9:06 AM

## 2019-09-08 NOTE — Progress Notes (Addendum)
Pts BP remains low with maps now in 50's. Pt more alert, remains confused but able to communicate needs. Md Therapist, sports) paged to make aware of low BPs. Awaiting new orders at this time.

## 2019-09-08 NOTE — Progress Notes (Signed)
Nutrition Follow-up  DOCUMENTATION CODES:   Obesity unspecified  INTERVENTION:   -Continue 30 ml Prostat TID, each supplement provides 100 kcals and 15 grams protein -Continue renal MVI daily -Cntinue Boost Breeze po BID, each supplement provides 250 kcal and 9 grams of protein  NUTRITION DIAGNOSIS:   Inadequate oral intake related to altered GI function as evidenced by meal completion < 25%.  Ongoing  GOAL:   Patient will meet greater than or equal to 90% of their needs  Progressing; plan to start TPN today  MONITOR:   PO intake, Supplement acceptance, Diet advancement, Labs, Weight trends, Skin, I & O's  REASON FOR ASSESSMENT:   Consult New TPN/TNA  ASSESSMENT:   69 y/o F who presented to [facility] on 3/21 with reports of dark stools and altered mental status.  3/22- s/p upper endoscopy- revealed gastric polyps (biopsied), mild erosive gastropathy with no bleeding (biopsied), nodule in duodenum (biopsied) 3/23- s/p colonoscopy  3/25- s/p Visceral arteriogram and embolization 3/30- s/p R hemicolectomy   Pt lethargic and minimally responsive to commands; neurology following.   Intake very poor; noted meal completion 0-50%. Pt has been refusing medications.   Per pharmacy note, plan to start TPN today; start TPN at 33mL/hr at 1800, which provides 1081 kcals and  53 grams protein, meeting 57% pf estimated kcals and 59% of estimated protein needs.  Labs reviewed: CBGS: 98-147 (inpatient orders for glycemic control are 0-6 units insulin aspart every 4 hours).   Diet Order:   Diet Order            Diet clear liquid Room service appropriate? Yes; Fluid consistency: Thin  Diet effective now              EDUCATION NEEDS:   No education needs have been identified at this time  Skin:  Skin Assessment: Skin Integrity Issues: Skin Integrity Issues:: Incisions Incisions: abdomen  Last BM:  09/06/19  Height:   Ht Readings from Last 1 Encounters:  08/28/2019 4\' 10"   (1.473 m)    Weight:   Wt Readings from Last 1 Encounters:  09/08/19 86.6 kg    Ideal Body Weight:  44.1 kg  BMI:  Body mass index is 39.9 kg/m.  Estimated Nutritional Needs:   Kcal:  1900-2100  Protein:  90-105 grams  Fluid:  1000 ml + UOP    Loistine Chance, RD, LDN, [place] Registered Dietitian II Certified Diabetes Care and Education Specialist Please refer to [facility] for RD and/or RD on-call/weekend/after hours pager

## 2019-09-08 NOTE — Progress Notes (Addendum)
Patient received from hemodialysis, lethargic, open eyes to voice. Not answering questions. Unable to assess orientation. Resting comfortably in bed with no s/s of pain or distress. Blood pressure monitored and increased slowly. AV fistula with present bruit and thrill. Dressings dry and intact over AV fistula. CBG was 117 on arrival to unit. Patients son Roberta Bryant into visit. Central line assessed, WNL.  D5 1/2 NS infusing at 75 mls/hr, decreased to 35 mls/hr as ordered. TPN infusing at 40 mls/hr, started by IV team. Resting comfortably at change of shift.

## 2019-09-08 NOTE — Progress Notes (Signed)
[facility] Progress Note  3 Days Post-Op  Subjective: CC-  Patient is not interactive this morning. She will open eyes but does not answer questions. Vital signs stable. AMS work up initiated per primary team yesterday, so far negative. No BM. Unsure if passing flatus. Hgb stable at 9.5  Objective: Vital signs in last 24 hours: Temp:  [97.5 F (36.4 C)-98.3 F (36.8 C)] 98.3 F (36.8 C) (04/02 0725) Pulse Rate:  [95-107] 102 (04/02 0725) Resp:  [15-24] 17 (04/02 0725) BP: (81-113)/(43-63) 105/58 (04/02 0725) SpO2:  [96 %-100 %] 98 % (04/02 0725) Weight:  [86.6 kg] 86.6 kg (04/02 0431) Last BM Date: 09/06/19  Intake/Output from previous day: 04/01 0701 - 04/02 0700 In: 1194 [P.O.:100; I.V.:844; IV Piggyback:250] Out: -  Intake/Output this shift: No intake/output data recorded.  PE: Gen: NAD Pulm: Normal rate and effort Abd: obese, soft, ND,appropriately tender around midline incision, hypoactive bowel sounds. Honeycomb dressing in place with small amount of dried blood noted on dressing. Staples underneath appear c/d/i Skin: no rashes noted, warm and dry  Lab Results:  Recent Labs    09/07/19 0516 09/08/19 0500  WBC 7.8  --   HGB 9.6* 9.5*  HCT 28.4* 28.7*  PLT 129*  --    BMET Recent Labs    09/07/19 0516 09/08/19 0500  NA 138 138  K 3.6 3.5  CL 99 100  CO2 23 23  GLUCOSE 58* 136*  BUN 5* 7*  CREATININE 2.67* 3.63*  CALCIUM 7.2* 7.3*   PT/INR Recent Labs    09/06/19 0632 09/07/19 0516  LABPROT 17.2* 18.1*  INR 1.4* 1.5*   CMP     Component Value Date/Time   NA 138 09/08/2019 0500   K 3.5 09/08/2019 0500   CL 100 09/08/2019 0500   CO2 23 09/08/2019 0500   GLUCOSE 136 (H) 09/08/2019 0500   BUN 7 (L) 09/08/2019 0500   CREATININE 3.63 (H) 09/08/2019 0500   CALCIUM 7.3 (L) 09/08/2019 0500   CALCIUM 7.4 (L) 09/12/2008 0615   PROT 3.8 (L) 09/08/2019 0500   ALBUMIN 1.6 (L) 09/08/2019 0500   AST 125 (H) 09/08/2019 0500   ALT 39  09/08/2019 0500   ALKPHOS 85 09/08/2019 0500   BILITOT 1.5 (H) 09/08/2019 0500   GFRNONAA 12 (L) 09/08/2019 0500   GFRAA 14 (L) 09/08/2019 0500   Lipase     Component Value Date/Time   LIPASE 43 07/19/2018 2330       Studies/Results: CT HEAD WO CONTRAST  Result Date: 09/07/2019 CLINICAL DATA:  Altered mental status (AMS), unclear cause. EXAM: CT HEAD WITHOUT CONTRAST TECHNIQUE: Contiguous axial images were obtained from the base of the skull through the vertex without intravenous contrast. COMPARISON:  Head CT 08/23/2019, brain MRI 06/07/2019 FINDINGS: Brain: There is no evidence of acute intracranial hemorrhage, intracranial mass, midline shift or extra-axial fluid collection.Redemonstrated chronic cortically based left parietal lobe infarct. Redemonstrated chronic lacunar infarct within the left basal ganglia and adjacent white matter. No new demarcated infarct is identified. Background mild ill-defined hypoattenuation within the cerebral white matter is nonspecific, but consistent with chronic small vessel ischemic disease. Stable, mild generalized parenchymal atrophy. Partially empty sella turcica. Vascular: No hyperdense vessel.  Atherosclerotic calcifications Skull: Normal. Negative for fracture or focal lesion. Sinuses/Orbits: Frothy secretions and air-fluid level within the left maxillary sinus. Small right frontal sinus osteoma. Sequela of prior right mastoidectomy. The remaining right mastoid air cells and mastoidectomy cavity are well aerated. No significant left mastoid  effusion. Visualized orbits demonstrate no acute abnormality. IMPRESSION: 1. No evidence of acute intracranial abnormality. 2. Redemonstrated chronic infarcts within the left parietal lobe, as well as left basal ganglia and adjacent white matter. 3. Stable mild background generalized parenchymal atrophy and chronic small vessel ischemic disease. 4. Left maxillary sinusitis. Electronically Signed   By: Kellie Simmering DO    On: 09/07/2019 10:14   DG CHEST PORT 1 VIEW  Result Date: 09/07/2019 CLINICAL DATA:  Altered mental status EXAM: PORTABLE CHEST 1 VIEW COMPARISON:  08/31/2019 FINDINGS: Right IJ central line is unchanged. Low lung volumes. Persistent left basilar atelectasis. No pleural effusion or pneumothorax. Stable cardiomediastinal contours. Partially imaged right reverse shoulder arthroplasty. IMPRESSION: No substantial change.  Persistent left basilar atelectasis. Electronically Signed   By: Macy Mis M.D.   On: 09/07/2019 08:32    Anti-infectives: Anti-infectives (From admission, onward)   Start     Dose/Rate Route Frequency Ordered Stop   08/13/2019 1015  cefoTEtan (CEFOTAN) 2 g in sodium chloride 0.9 % 100 mL IVPB     2 g 200 mL/hr over 30 Minutes Intravenous  Once 08/13/2019 1002 08/07/2019 1600       Assessment/Plan ESRD - dialysis on MWF History of CHF HTN CAD History of stroke DM2 Deconditioning HxCovid about 8 weeks ago Protein Calorie Malnutrition - Per-Alb 5.8 (4/2), start TPN AMS - workup per primary  Lower GI bleed ABL Anemia -s/p right hemicolectomy - Dr. Ninfa Linden - 08/25/2019 - POD #3 - Cont CLD today while awaiting ROBF. Prealbumin is down and suspect she may have a prolonged ileus, will order TPN.  - Hgb stable at 9.5 this AM - Mobilize, PT. PT currently recommending SNF - Pulm toilet, IS  FEN -CLD. AROBF VTE -SCDs, ok for chemical dvt prophylaxis as hgb has remained stable since surgery ID -Cefotetan peri-op Foley - None Follow up - Dr. Ninfa Linden    LOS: 12 days    Wellington Hampshire, [facility] Surgery 09/08/2019, 8:31 AM Please see Amion for pager number during day hours 7:00am-4:30pm

## 2019-09-08 NOTE — Consult Note (Addendum)
NEURO HOSPITALIST CONSULT NOTE   Requesting physician: Dr. Ree Kida  Reason for Consult: AMS  History obtained from:  Chart review  HPI:                                                                                                                                         Roberta Bryant is a 69 year old female with AMS. She is unable to provide a history, necessitating chart review for history taking: "69 y/o F who presented to [facility] on 3/21 with reports of dark stools and altered mental status. The patient was seen on 3/17 in the ER after she was noted to be altered staring off and not talking during HD. She reported feeling generalized weakness and missed HD on 3/15. Only completed 50 minutes of HD on 3/17. Her Hgb at that time was 8.8. She reports she had bleeding from her fistula on Friday 3/19 that was brisk and difficult to control. She returns to the ER on 3/21 with reports of black-red stools, weakness, abdominal pain. EMS found her to have a BP of 90/50 and treated her with 653m NS. BP improved to 116/50. She was awake/alert on arrival with SBP's remaining in the 90's. Her last HD session was 3/19. The patient's initial labs concerning for worsening anemia with Hgb of 5.4 (iSTAT). She was treated with 2 units blood in the ER. CXR was notable for bibasilar atelectasis and low lung volumes but no acute process. She was FOBT positive on presentation with subsequent melanic stools in the ER."   Neurology consulted for continued AMS.  Hospital course: 3/21 admitted for AMS and GI bleed, FOB: +, Hgb: 4.9, received PRBC. 3/25: IR: successful coil and embolization of accessory branch of middle colic artery 4/1 : CTH : no hemorrhage, ammonia: WNL hgb: 9.6  4/2 neuro consulted for continued AMS    Past Medical History:  Diagnosis Date  . Anemia   . Aortic stenosis   . [place]estive heart failure[place]   . Childhood asthma   . Chronic upper back pain   .  Coronary artery disease    Stent to left main coronary artery second of August 2017  . Decreased hearing   . ESRD (end stage renal disease) on dialysis ([facility]    "MWF[place] [facility] [place] (01[place]06[place]  . Gait abnormality 08/28/2016  . GERD (gastroesophageal reflux disease)   . Headache    "a few times/week" (10/30/2014)  . Headache syndrome 01/08/2017  . Heart murmur   . History of blood transfusion 1996   "related to menses"  . Hyperlipidemia   . Hypertension   . Hypothyroidism   . IBS (irritable bowel syndrome)   . Myocardial infarction ([place] 2004  . Osteoarthritis   . Pneumonia    "  years ago"  . Rotator cuff arthropathy of right shoulder   . Spondylosis of cervical spine 06/16/2017  . Stroke ([place]) 07/2016   mini stroke , right side of body weak-   . Type II diabetes mellitus ([place])     Past Surgical History:  Procedure Laterality Date  . A/V FISTULAGRAM Left 05/31/2018   Procedure: A/V FISTULAGRAM;  Surgeon: Serafina Mitchell, MD;  Location: [place] [facility];  Service: Cardiovascular;  Laterality: Left;  . APPENDECTOMY    . AV FISTULA PLACEMENT Left 12/03/2006   "forearm"  . AV FISTULA REPAIR Left 11/25/2009   "forearm"  . BIOPSY  09/02/2019   Procedure: BIOPSY;  Surgeon: Thornton Park, MD;  Location: [facility] ENDOSCOPY;  Service: Gastroenterology;;  . Wilmon Pali RELEASE Right 02/11/2017   Procedure: RIGHT CARPAL TUNNEL RELEASE;  Surgeon: Daryll Brod, MD;  Location: [place];  Service: Orthopedics;  Laterality: Right;  . CHOLECYSTECTOMY OPEN  1970's  . COLONOSCOPY W/ POLYPECTOMY  07/30/2010   Minimal sigmoid diverticulosis. Small internal hemorrhoids. Otherwise normal colonoscopy to TI.   Marland Kitchen COLONOSCOPY WITH PROPOFOL N/A 09/02/2019   Procedure: COLONOSCOPY WITH PROPOFOL;  Surgeon: Irene Shipper, MD;  Location: [facility] END[facility]COPY;  Service: Endoscopy;  Laterality: N/A;  . CORONARY ANGIOPLASTY    . CORONARY ANGIOPLASTY WITH STENT PLACEMENT    .  ESOPHAGOGASTRODUODENOSCOPY  10/21/2012   Gastric polyps- status post polypectomy. Mild gastritis.   Marland Kitchen ESOPHAGOGASTRODUODENOSCOPY (EGD) WITH PROPOFOL N/A 09/03/2019   Procedure: ESOPHAGOGASTRODUODENOSCOPY (EGD) WITH PROPOFOL;  Surgeon: Thornton Park, MD;  Location: [place];  Service: Gastroenterology;  Laterality: N/A;  . INSERTION OF DIALYSIS CATHETER Right 2010   "chest"  . IR ANGIOGRAM SELECTIVE EACH ADDITIONAL VESSEL  09/01/2019  . IR ANGIOGRAM SELECTIVE EACH ADDITIONAL VESSEL  09/01/2019  . IR ANGIOGRAM SELECTIVE EACH ADDITIONAL VESSEL  09/01/2019  . IR ANGIOGRAM VISCERAL SELECTIVE  09/01/2019  . IR ANGIOGRAM VISCERAL SELECTIVE  09/01/2019  . IR EMBO ART  VEN HEMORR LYMPH EXTRAV  INC GUIDE ROADMAPPING  09/01/2019  . IR US GUIDE VASC ACCESS RIGHT  09/01/2019  . PARATHYROIDECTOMY  2010?   Parathyroid autotransplantation   . PARTIAL COLECTOMY N/A 09/06/2019   Procedure: PARTIAL COLECTOMY;  Surgeon: Coralie Keens, MD;  Location: [place];  Service: General;  Laterality: N/A;  . POLYPECTOMY  08/23/2019   Procedure: POLYPECTOMY;  Surgeon: Irene Shipper, MD;  Location: [facility] ENDOSCOPY;  Service: Endoscopy;;  . REVERSE SHOULDER ARTHROPLASTY Right 01/06/2018  . REVERSE SHOULDER ARTHROPLASTY Right 01/06/2018   Procedure: RIGHT REVERSE SHOULDER ARTHROPLASTY;  Surgeon: [name], MD;  Location: [place];  Service: Orthopedics;  Laterality: Right;  . REVISON OF ARTERIOVENOUS FISTULA Left 12/04/2013   Procedure: REVISON OF ARTERIOVENOUS FISTULA;  Surgeon: Rosetta Posner, MD;  Location: [place];  Service: Vascular;  Laterality: Left;  . REVISON OF ARTERIOVENOUS FISTULA Left 12/09/1285   Procedure: PLICATION OF LEFT BRACHIOCEPHALIC  ARTERIOVENOUS FISTULA;  Surgeon: Conrad Harleysville, MD;  Location: [place];  Service: Vascular;  Laterality: Left;  . SHOULDER ARTHROSCOPY WITH ROTATOR CUFF REPAIR AND SUBACROMIAL DECOMPRESSION Right 06/22/2017   Procedure: RIGHT SHOULDER ARTHROSCOPY WITH EXTENSIVE DEBRIDEMENT;   Surgeon: Mcarthur Rossetti, MD;  Location: [place];  Service: Orthopedics;  Laterality: Right;  . TRIGGER FINGER RELEASE Right 10/22/2017   Procedure: RELEASE RIGHT INDEX TRIGGER FINGER/A-1 PULLEY;  Surgeon: Daryll Brod, MD;  Location: [place];  Service: Orthopedics;  Laterality: Right;  . TUBAL LIGATION  1989  . UMBILICAL HERNIA REPAIR  1990's X 2    Family History  Problem Relation Age of Onset  . Epilepsy Mother   . Diabetes Mother   . Cancer Mother   . Alcohol abuse Father         Social History:  reports that she has quit smoking. Her smoking use included cigarettes. She quit after 3.00 years of use. She has never used smokeless tobacco. She reports previous drug use. Drugs: Marijuana and Hydromorphone. She reports that she does not drink alcohol.  Allergies  Allergen Reactions  . Sulfa Antibiotics Anaphylaxis  . Amoxicillin Hives    Has patient had a PCN reaction causing immediate rash, facial/tongue/throat swelling, SOB or lightheadedness with hypotension: No Has patient had a PCN reaction causing severe rash involving mucus membranes or skin necrosis: No Has patient had a PCN reaction that required hospitalization: No Has patient had a PCN reaction occurring within the last 10 years: Unknown If all of the above answers are "NO", then may proceed with Cephalosporin use.   Marland Kitchen Crestor [Rosuvastatin Calcium] Other (See Comments)    Dizziness, couldn't walk  . Ibuprofen Hives  . Naldecon Senior [employer]  . Statins Hives  . Chlorphen-Phenyleph-Asa Rash  . Gabapentin Other (See Comments)    Dizzy    MEDICATIONS:                                                                                                                     Scheduled: . acetaminophen  650 mg Oral Q6H  . calcitRIOL  1 mcg Oral Q M,W,F-HD  . Chlorhexidine Gluconate Cloth  6 each Topical Q0600  . darbepoetin (ARANESP) injection - DIALYSIS  100 mcg Intravenous Q Wed-HD  . dextrose  12.5 g  Intravenous STAT  . docusate sodium  100 mg Oral BID  . feeding supplement  1 Container Oral TID BM  . feeding supplement (PRO-STAT SUGAR FREE 64)  30 mL Oral TID  . insulin aspart  0-6 Units Subcutaneous Q4H  . levothyroxine  50 mcg Oral QAC breakfast  . multivitamin  1 tablet Oral QHS  . pantoprazole  40 mg Oral Q0600  . phytonadione  10 mg Oral Daily  . sodium chloride flush  10-40 mL Intracatheter Q12H   Continuous: . sodium chloride Stopped (09/07/19 1550)  . sodium chloride    . sodium chloride 600 mL/hr at 08/09/2019 1649  . dextrose 5 % and 0.45% NaCl 75 mL/hr at 09/08/19 0400  . dextrose 5 % and 0.45% NaCl    . methocarbamol (ROBAXIN) IV    . TPN ADULT (ION)     TUU:EKCMKL chloride, sodium chloride, acetaminophen, methocarbamol (ROBAXIN) IV, ondansetron (ZOFRAN) IV, oxyCODONE, sodium chloride flush, technetium labeled red blood cells   ROS:  unobtainable from patient due to mental status  Blood pressure (!) 105/58, pulse (!) 102, temperature 98.3 F (36.8 C), temperature source Oral, resp. rate 17, height 4' 10" (1.473 m), weight 86.6 kg, SpO2 98 %.   General Examination:                                                                                                       Physical Exam  Constitutional: Appears well-developed and well-nourished.  Eyes: Normal external eye and conjunctiva. HENT: Normocephalic, no lesions, without obvious abnormality.   Musculoskeletal- generalized edema. Large right arm hematoma Cardiovascular: Normal rate and regular rhythm.  Respiratory: Effort normal, non-labored breathing saturations WNL on RA GI: Soft.  No distension. There is no tenderness.  Skin: WDI  Neurological Examination Mental Status: Obtunded. Did not arouse to voice.  Opened eyes to LT and voice. Does not follow any commands. grimaces to  pain. No verbalizations noted.  Cranial Nerves: PERRL. Unreliably blinks to threat. No nystagmus noted. Face appears symmetric. Motor/sensory: Grimaces to pain. Did not localize or withdraw to noxious stimuli. Tone and bulk:normal tone throughout; no atrophy noted Plantars: Right: tonically upgoing Left: upgoing Cerebellar: UTA Gait: UTA Other: No jerking, twitching, eye deviation, automatisms or other motor findings suggestive of seizure.    Lab Results: Basic Metabolic Panel: Recent Labs  Lab 09/04/19 0851 09/04/19 0851 09/02/2019 1542 08/25/2019 1833 09/04/2019 1833 09/06/19 0643 09/07/19 0516 09/08/19 0500  NA 136   < > 140 136  --  135 138 138  K 4.5   < > 3.5 3.4*  --  3.7 3.6 3.5  CL 100  --   --  101  --  102 99 100  CO2 21*  --   --  22  --  _0 GLUCOSE 85  --   --  116*  --  117* 58* 136*  BUN 18  --   --  8  --  10 5* 7*  CREATININE 4.81*  --   --  3.48*  --  3.82* 2.67* 3.63*  CALCIUM 7.6*   < >  --  7.1*   < > 7.1* 7.2* 7.3*  PHOS  --   --   --   --   --  3.4 2.5  --    < > = values in this interval not displayed.    CBC: Recent Labs  Lab 09/02/19 1235 09/02/19 1434 09/03/19 0250 09/03/19 0250 09/03/19 0727 09/03/19 1100 08/30/2019 0725 08/16/2019 1542 09/06/19 0632 09/06/19 1430 09/06/19 2000 09/07/19 0516 09/08/19 0500  WBC 7.0  --  7.6  --  7.6  --  7.9  --   --   --   --  7.8  --   HGB 8.6*   < > 9.1*   < > 9.9*   < > 6.8*   < > 9.7* 9.8* 9.7* 9.6* 9.5*  HCT 26.6*   < > 27.8*   < > 30.9*   < > 21.3*   < > 28.9* 28.7* 28.6* 28.4* 28.7*  MCV  94.3  --  94.6  --  96.3  --  96.8  --   --   --   --  92.2  --   PLT 139*  --  144*  --  159  --  129*  --   --   --   --  129*  --    < > = values in this interval not displayed.    Cardiac Enzymes: No results for input(s): CKTOTAL, CKMB, CKMBINDEX, TROPONINI in the last 168 hours.  Lipid Panel: No results for input(s): CHOL, TRIG, HDL, CHOLHDL, VLDL, LDLCALC in the last 168 hours.  Imaging: CT HEAD  WO CONTRAST  Result Date: 09/07/2019 CLINICAL DATA:  Altered mental status (AMS), unclear cause. EXAM: CT HEAD WITHOUT CONTRAST TECHNIQUE: Contiguous axial images were obtained from the base of the skull through the vertex without intravenous contrast. COMPARISON:  Head CT 08/23/2019, brain MRI 06/07/2019 FINDINGS: Brain: There is no evidence of acute intracranial hemorrhage, intracranial mass, midline shift or extra-axial fluid collection.Redemonstrated chronic cortically based left parietal lobe infarct. Redemonstrated chronic lacunar infarct within the left basal ganglia and adjacent white matter. No new demarcated infarct is identified. Background mild ill-defined hypoattenuation within the cerebral white matter is nonspecific, but consistent with chronic small vessel ischemic disease. Stable, mild generalized parenchymal atrophy. Partially empty sella turcica. Vascular: No hyperdense vessel.  Atherosclerotic calcifications Skull: Normal. Negative for fracture or focal lesion. Sinuses/Orbits: Frothy secretions and air-fluid level within the left maxillary sinus. Small right frontal sinus osteoma. Sequela of prior right mastoidectomy. The remaining right mastoid air cells and mastoidectomy cavity are well aerated. No significant left mastoid effusion. Visualized orbits demonstrate no acute abnormality. IMPRESSION: 1. No evidence of acute intracranial abnormality. 2. Redemonstrated chronic infarcts within the left parietal lobe, as well as left basal ganglia and adjacent white matter. 3. Stable mild background generalized parenchymal atrophy and chronic small vessel ischemic disease. 4. Left maxillary sinusitis. Electronically Signed   By: Kellie Simmering DO   On: 09/07/2019 10:14   DG CHEST PORT 1 VIEW  Result Date: 09/07/2019 CLINICAL DATA:  Altered mental status EXAM: PORTABLE CHEST 1 VIEW COMPARISON:  08/22/2019 FINDINGS: Right IJ central line is unchanged. Low lung volumes. Persistent left basilar  atelectasis. No pleural effusion or pneumothorax. Stable cardiomediastinal contours. Partially imaged right reverse shoulder arthroplasty. IMPRESSION: No substantial change.  Persistent left basilar atelectasis. Electronically Signed   By: Macy Mis M.D.   On: 09/07/2019 08:32    Assessment:  69 year old female with PMHx of DM2, ESRD on HD (MWF), MI, HTN, CVA (07/2016) admitted on 08/13/2019 for GI bleed and AMS.  1. On exam today the patient is obtunded and not following commands, with no verbalizations noted. Grimaces to noxious stimuli and opens eyes.  2. Afebrile, with infectious work-up thus far is negative.  3. Ammonia normal at 33. AST mildly elevated at 125. ALT 39. Vitamin B12 normal. Cr elevated at 3.63. Total Ca low at 7.3. Ionized Ca low at 1.01. Mg low at 1.6.  4. DDx includes multifactorial encephalopathy with metabolic component due to electrolyte abnormalities, possible thiamine deficiency and EtOH withdrawal although she has been here since 3/21, which would likely put her out of the time window for this.  5. Also possible would be subclinical seizures with postictal state.   61. Has old strokes on CT. A new stroke could also contribute to her encephalopathy.  7. Other than an opiate, there are no medications on her current  hospital medications list that would be expected to result in an encephalopathy. Presentation and meds not c/w serotonin syndrome or neuroleptic malignant syndrome.    Recommendations: - TSH, Thiamine leve, RPR, ESR and C-reactive protein - Start empiric high-dose thiamine at 500 mg IV TID x 3 days (ordered), then 100 mg po qd thereafter - EEG (ordered) - MRI brain - Trial of Ativan 1 mg IV q6h x 4 doses.    Laurey Morale, MSN, NP-C [facility] [phone]  I have seen and examined the patient. I have formulated the assessment and recommendations. 69 year old female presenting with GIB and progressively worsening AMS. She is  encephalopathic on exam. Recommendations include thiamine supplementation, EEG and MRI brain.  Electronically signed: Dr. Kerney Elbe 09/08/2019, 10:47 AM

## 2019-09-08 NOTE — Significant Event (Signed)
Rapid Response Event Note  Overview: Hypotensive and Decreased LOC  Initial Focused Assessment: Called urgently to HD unit for patient being hard to arouse after becoming hypotensive. Upon my arrival, SBP was in the 90s, patient would localize to painful stimuli, pupils 3 mm bilateral/equal/brisk/reactive, + CR/GAG. Skin slightly cool to touch but good pulses. HR 85, 100 % on RA, normal respiratory effort - not in acute distress. BP 94/44, 92/45.  Per nursing staff and medical team - altered mental status and fluctuation in mental status have been ongoing. Blood sugar was 123 - on D51/2NS at 75cc/hr.   Interventions: -- NO RRT INTERVENTIONS.  Plan of Care: -- Transferred to 6E from HD, I spoke with TRH MD, will monitor BP and neurologic status -- Rest per MD   Event Summary:  Call Time 1356  End Time 1635  Kattleya Kuhnert R

## 2019-09-08 NOTE — TOC Progression Note (Signed)
Transition of Care [facility]) - Progression Note    Patient Details  Name: Roberta Bryant MRN: [id number] Date of Birth: 07-Jun-1951  Transition of Care [facility]) CM/SW [place], [place] Phone Number: 09/08/2019, 9:09 AM  Clinical Narrative:     [facility] team continues to follow for discharge planning, patient has bed at [facility] when medically ready for dc and if SNF remains appropriate discharge plan.   CSW notes palliative involvement at this time, pending recommendations once family calls them back.   Expected Discharge Plan: [place] Barriers to Discharge: Continued Medical Work up  Expected Discharge Plan and Services Expected Discharge Plan: [place] In-house Referral: Clinical Social Work Discharge Planning Services: CM Consult Post Acute Care Choice: [place] arrangements for the past 2 months: [place] Determinants of Health (SDOH) Interventions    Readmission Risk Interventions Readmission Risk Prevention Plan 08/07/2019  Transportation Screening Complete  Medication Review [employer]) Referral to Pharmacy  PCP or Specialist appointment within 3-5 days of discharge Not Complete  PCP/Specialist Appt Not Complete comments plan for poss SNF  [place] or Home Care Consult Complete  SW Recovery Care/Counseling Consult Complete  Palliative Care Screening Not Applicable  Skilled Nursing Facility Complete  Some recent data might be hidden

## 2019-09-08 NOTE — Progress Notes (Signed)
PROGRESS NOTE    Roberta Bryant  HBZ:[id number] DOB: 07/05/1950 DOA: 08/13/2019 PCP: [name], MD   Brief Narrative:  HPI on 09/01/2019 by Ms. Roberta Gens, NP (ICU) 69 y/o F who presented to [facility] on 3/21 with reports of dark stools and altered mental status.    The patient was seen on 3/17 in the ER after she was noted to be altered staring off and not talking during HD. She reported feeling generalized weakness and missed HD on 3/15.  Only completed 50 minutes of HD on 3/17. Her Hgb at that time was 8.8. She reports she had bleeding from her fistula on Friday 3/19 that was brisk and difficult to control.   She returns to the ER on 3/21 with reports of black-red stools, weakness, abdominal pain.  EMS found her to have a BP of 90/50 and treated her with 643ml NS.  BP improved to 116/50.  She was awake/alert on arrival with SBP's remaining in the 90's.  Her last HD session was 3/19.  The patient's initial labs concerning for worsening anemia with Hgb of 5.4 (iSTAT).  She was treated with 2 units blood in the ER.  CXR was notable for bibasilar atelectasis and low lung volumes but no acute process.  She was FOBT positive on presentation with subsequent melanic stools in the ER.  GI, Nephrology consulted.    PCCM consulted for admission.    Interim history Patient admitted with GI bleed, status post EGD and colonoscopy found to have nonbleeding erosive gastropathy, peptic duodenitis, removal of 6 polyps from the right and left colon.  Pathology showed tubular adenomas.  She continued to have rectal bleeding, CTA 08/31/2019 with active bleeding in the hepatic flexure and had embolization done.  She continues to have intermittent rectal bleeding and requires blood transfusions.  Patient did have bleeding scan which is positive for bleeding at the hepatic flexure at the previous embolization site.  General surgery consulted after IR recommendation, status post right hemicolectomy. Assessment &  Plan   Recurrent diverticular GI bleeding/acute blood loss anemia/anemia of chronic kidney disease -Gastroenterology consult appreciated -Status post EGD 3/22: Nonbleeding erosive gastropathy, peptic duodenitis -Status post colonoscopy 3/23: 6 polyps removed from the right and left colon, left colon diverticulosis, no stigmata of bleeding.  Pathology shows tubular adenomas -Status post CTA which showed active bleeding at the hepatic flexure, underwent arteriogram and embolization on 08/31/2019 -Continued to have bleeding, NM bleeding scan obtained on 09/02/2019 which showed hepatic flexure bleeding again. -Interventional radiology, and general surgery consulted and appreciated -Interventional radiology feels that other attempted embolizing could lead to ischemic bowel/necrosis which could necessitate emergent colectomy -General surgery on 08/09/2019 performed right hemicolectomy -Was admitted with hemoglobin of 4.9 and has received 5 units of PRBC to date -hemoglobin 9.5 -Currently on Aranesp -Anemia panel showed adequate iron storage on 08/14/2019 -Continue to monitor H&H and transfuse as needed  Bleeding from dialysis fistula -Improved  End-stage renal disease -Patient dialyzes Monday, Wednesday, Friday -Nephrology consulted and appreciated  LUE aVF pulsatile with LUE larger than RUE ?CV Stenosis -Per nephrology, patient will need fistulogram once stable  Thrombocytopenia -have improved, 129 today -Need to monitor CBC  Elevated INR -Present on admission, has now improved, 1.5 today -Currently receiving vitamin K daily  Essential hypertension -BP has been soft, requiring patient to receive several IV fluid boluses as well as blood transfusion -Continue to monitor closely  Anxiety/depression -Continue Klonopin  Metabolic bone disease -Continue calcitriol  Diabetes mellitus,  type II -Hemoglobin A1c was 4.9 (08/25/2019)  -Januvia currently held -Patient has had intermittent  hypoglycemia- started on D5 1/2 NS  -Placed on insulin sliding scale with CBG monitoring  Hypothyroidism -Continue Synthroid  Coronary artery disease -Patient follows with cardiologist at [facility] stress test was in 2019 showing an EF of 52%, low risk study -Currently no complaints of chest pain -Aspirin held due to GI bleed -Coreg held due to soft BP  Intermittent mild confusion/acute metabolic encephalopathy -Continues to have confusion -Question whether this is due to GI bleeding versus other cause -Continue to monitor closely -CT head: no evidence of acute intracranial abnormality. Chronic infarcts L parietal lobe, left basal ganglia, adjacent white matter  -Chest x-ray unremarkable for infection -ABG within normal limits, mild hypoxia -Ammonia level 33 -Discussed with son via phone, states patient has had intermittent confusion hallucinations prior to admission. Mental status worsened some after COVID infection in February 2021. Has been seeing Dr. Jannifer Franklin, neurology. Was diagnosed with Parkinsons, but states this was an incorrect diagnosis. Also states patient has had lower extremity weakness and gait abnormalities. -?underlying dementia -have reviewed outpatient neurology notes, no mention of mental/memory impairment or AMS -Neurology consulted and appreciated   Goals of care -Palliative care consulted and appreciated  Deconditioning -Continue PT/OT-recommended SNF  Poor oral intake -Has had inadequate oral intake, likely due to GI function -Will continue to monitor -Nutrition consulted, continue supplements  DVT Prophylaxis  SCDs  Code Status: Full  Family Communication: None at bedside. Son via phone  Disposition Plan: Admitted from home for GI bleeding and anemia. S/p R hemicolectomy on 3/30. Pending further surgical recommendations and stabilization of hemoglobin. Continues to have AMS, pending workup. Suspect SNF when stable.    Consultants Neurosurgery Nephrology Gastroenterology Interventional radiology PCCM Neurology  Procedures  08/17/2019 EGD: Afew gastric polyps biopsied: hyperplastic polyp with eroded granulation tissue and surface reactive change. Mild, nonbleeding, erosive gastropathy biopsied:Mild reactive gastropathy with some surface hyperplastic change, no H. Pylori.Duodenal nodule biopsied:peptic duodenitis.   08/08/2019 colonoscopy: 6 polyps removed located in right and left colon. Left colon diverticulosis. Solitary rectal ulcer, no stigmata of bleeding. Pathology shows tubular adenomas,noHGD. Ulcer not biopsied.   08/31/2019: IR arteriogram with bleeding in hepatic flexure, status post embolization  08/11/2019: Right hemicolectomy  Antibiotics   Anti-infectives (From admission, onward)   Start     Dose/Rate Route Frequency Ordered Stop   09/01/2019 1015  cefoTEtan (CEFOTAN) 2 g in sodium chloride 0.9 % 100 mL IVPB     2 g 200 mL/hr over 30 Minutes Intravenous  Once 08/21/2019 1002 08/26/2019 1600      Subjective:   Mahalie Kanner seen and examined today.  Not interactive this morning.  Objective:   Vitals:   09/08/19 0600 09/08/19 0630 09/08/19 0700 09/08/19 0725  BP: (!) 103/54 102/63 (!) 100/52 (!) 105/58  Pulse:  (!) 102  (!) 102  Resp: 16 16 16 17   Temp:    98.3 F (36.8 C)  TempSrc:    Oral  SpO2:  99%  98%  Weight:      Height:        Intake/Output Summary (Last 24 hours) at 09/08/2019 0917 Last data filed at 09/08/2019 0400 Gross per 24 hour  Intake 1193.99 ml  Output --  Net 1193.99 ml   Filed Weights   09/06/19 1330 09/06/19 1725 09/08/19 0431  Weight: 84.5 kg 83.4 kg 86.6 kg   Exam  General: Well developed, chronically ill appearing, NAD  HEENT: NCAT, mucous membranes moist.   Cardiovascular: S1 S2 auscultated, RRR, +SEM  Respiratory: Clear to auscultation bilaterally   Abdomen: Soft, obese, nontender, nondistended, + bowel sounds, honeycomb  dressing in place  Extremities: warm dry without cyanosis clubbing or edema  Neuro: Unable to fully arouse this morning.   Data Reviewed: I have personally reviewed following labs and imaging studies  CBC: Recent Labs  Lab 09/02/19 1235 09/02/19 1434 09/03/19 0250 09/03/19 0250 09/03/19 0727 09/03/19 1100 08/11/2019 0725 09/04/2019 1542 09/06/19 0632 09/06/19 1430 09/06/19 2000 09/07/19 0516 09/08/19 0500  WBC 7.0  --  7.6  --  7.6  --  7.9  --   --   --   --  7.8  --   HGB 8.6*   < > 9.1*   < > 9.9*   < > 6.8*   < > 9.7* 9.8* 9.7* 9.6* 9.5*  HCT 26.6*   < > 27.8*   < > 30.9*   < > 21.3*   < > 28.9* 28.7* 28.6* 28.4* 28.7*  MCV 94.3  --  94.6  --  96.3  --  96.8  --   --   --   --  92.2  --   PLT 139*  --  144*  --  159  --  129*  --   --   --   --  129*  --    < > = values in this interval not displayed.   Basic Metabolic Panel: Recent Labs  Lab 09/04/19 0851 09/04/19 0851 08/24/2019 1542 08/11/2019 1833 09/06/19 0643 09/07/19 0516 09/08/19 0500  NA 136   < > 140 136 135 138 138  K 4.5   < > 3.5 3.4* 3.7 3.6 3.5  CL 100  --   --  101 102 99 100  CO2 21*  --   --  22 22 23 23   GLUCOSE 85  --   --  116* 117* 58* 136*  BUN 18  --   --  8 10 5* 7*  CREATININE 4.81*  --   --  3.48* 3.82* 2.67* 3.63*  CALCIUM 7.6*  --   --  7.1* 7.1* 7.2* 7.3*  PHOS  --   --   --   --  3.4 2.5  --    < > = values in this interval not displayed.   GFR: Estimated Creatinine Clearance: 13.7 mL/min (A) (by C-G formula based on SCr of 3.63 mg/dL (H)). Liver Function Tests: Recent Labs  Lab 08/29/2019 1833 09/06/19 0643 09/07/19 0516 09/08/19 0500  AST 41  --   --  125*  ALT 22  --   --  39  ALKPHOS 52  --   --  85  BILITOT 1.4*  --   --  1.5*  PROT 4.3*  --   --  3.8*  ALBUMIN 2.2* 2.0* 1.8* 1.6*   No results for input(s): LIPASE, AMYLASE in the last 168 hours. Recent Labs  Lab 09/07/19 1223  AMMONIA 33   Coagulation Profile: Recent Labs  Lab 09/03/19 0727 09/04/19 0851  08/15/2019 1833 09/06/19 0632 09/07/19 0516  INR 1.3* 1.3* 1.6* 1.4* 1.5*   Cardiac Enzymes: No results for input(s): CKTOTAL, CKMB, CKMBINDEX, TROPONINI in the last 168 hours. BNP (last 3 results) No results for input(s): PROBNP in the last 8760 hours. HbA1C: No results for input(s): HGBA1C in the last 72 hours. CBG: Recent Labs  Lab 09/07/19 1618 09/07/19 1638 09/07/19 2046  09/08/19 0631 09/08/19 0852  GLUCAP 69* 69* 98 118* 112*   Lipid Profile: No results for input(s): CHOL, HDL, LDLCALC, TRIG, CHOLHDL, LDLDIRECT in the last 72 hours. Thyroid Function Tests: No results for input(s): TSH, T4TOTAL, FREET4, T3FREE, THYROIDAB in the last 72 hours. Anemia Panel: No results for input(s): VITAMINB12, FOLATE, FERRITIN, TIBC, IRON, RETICCTPCT in the last 72 hours. Urine analysis:    Component Value Date/Time   COLORURINE YELLOW 01/04/2018 1524   APPEARANCEUR CLEAR 01/04/2018 1524   LABSPEC 1.010 01/04/2018 1524   PHURINE 8.5 (H) 01/04/2018 1524   GLUCOSEU NEGATIVE 01/04/2018 1524   HGBUR NEGATIVE 01/04/2018 1524   [place] 01/04/2018 1524   KETONESUR NEGATIVE 01/04/2018 1524   PROTEINUR 100 (A) 01/04/2018 1524   UROBILINOGEN 0.2 10/30/2014 1801   NITRITE NEGATIVE 01/04/2018 1524   LEUKOCYTESUR SMALL (A) 01/04/2018 1524   Sepsis Labs: @LABRCNTIP (procalcitonin:4,lacticidven:4)  ) No results found for this or any previous visit (from the past 240 hour(s)).    Radiology Studies: CT HEAD WO CONTRAST  Result Date: 09/07/2019 CLINICAL DATA:  Altered mental status (AMS), unclear cause. EXAM: CT HEAD WITHOUT CONTRAST TECHNIQUE: Contiguous axial images were obtained from the base of the skull through the vertex without intravenous contrast. COMPARISON:  Head CT 08/23/2019, brain MRI 06/07/2019 FINDINGS: Brain: There is no evidence of acute intracranial hemorrhage, intracranial mass, midline shift or extra-axial fluid collection.Redemonstrated chronic cortically based  left parietal lobe infarct. Redemonstrated chronic lacunar infarct within the left basal ganglia and adjacent white matter. No new demarcated infarct is identified. Background mild ill-defined hypoattenuation within the cerebral white matter is nonspecific, but consistent with chronic small vessel ischemic disease. Stable, mild generalized parenchymal atrophy. Partially empty sella turcica. Vascular: No hyperdense vessel.  Atherosclerotic calcifications Skull: Normal. Negative for fracture or focal lesion. Sinuses/Orbits: Frothy secretions and air-fluid level within the left maxillary sinus. Small right frontal sinus osteoma. Sequela of prior right mastoidectomy. The remaining right mastoid air cells and mastoidectomy cavity are well aerated. No significant left mastoid effusion. Visualized orbits demonstrate no acute abnormality. IMPRESSION: 1. No evidence of acute intracranial abnormality. 2. Redemonstrated chronic infarcts within the left parietal lobe, as well as left basal ganglia and adjacent white matter. 3. Stable mild background generalized parenchymal atrophy and chronic small vessel ischemic disease. 4. Left maxillary sinusitis. Electronically Signed   By: Kellie Simmering DO   On: 09/07/2019 10:14   DG CHEST PORT 1 VIEW  Result Date: 09/07/2019 CLINICAL DATA:  Altered mental status EXAM: PORTABLE CHEST 1 VIEW COMPARISON:  08/18/2019 FINDINGS: Right IJ central line is unchanged. Low lung volumes. Persistent left basilar atelectasis. No pleural effusion or pneumothorax. Stable cardiomediastinal contours. Partially imaged right reverse shoulder arthroplasty. IMPRESSION: No substantial change.  Persistent left basilar atelectasis. Electronically Signed   By: Macy Mis M.D.   On: 09/07/2019 08:32     Scheduled Meds: . sodium chloride   Intravenous Once  . sodium chloride   Intravenous Once  . sodium chloride   Intravenous Once  . acetaminophen  650 mg Oral Q6H  . calcitRIOL  1 mcg Oral Q M,W,F-HD   . Chlorhexidine Gluconate Cloth  6 each Topical Q0600  . darbepoetin (ARANESP) injection - DIALYSIS  100 mcg Intravenous Q Wed-HD  . dextrose  12.5 g Intravenous STAT  . docusate sodium  100 mg Oral BID  . feeding supplement  1 Container Oral TID BM  . feeding supplement (PRO-STAT SUGAR FREE 64)  30 mL Oral TID  . insulin  aspart  0-6 Units Subcutaneous TID WC  . levothyroxine  50 mcg Oral QAC breakfast  . multivitamin  1 tablet Oral QHS  . pantoprazole  40 mg Oral Q0600  . phytonadione  10 mg Oral Daily  . QUEtiapine  12.5 mg Oral QHS  . sodium chloride flush  10-40 mL Intracatheter Q12H   Continuous Infusions: . sodium chloride Stopped (09/07/19 1550)  . sodium chloride    . sodium chloride    . sodium chloride 600 mL/hr at 08/08/2019 1649  . dextrose 5 % and 0.45% NaCl 75 mL/hr at 09/08/19 0400  . methocarbamol (ROBAXIN) IV       LOS: 12 days   Time Spent in minutes   45 minutes  Willer Osorno D.O. on 09/08/2019 at 9:17 AM  Between 7am to 7pm - Please see pager noted on [email or web address]  After 7pm go to [email or web address]  And look for the night coverage person covering for me after hours  [facility] Office  [phone]

## 2019-09-08 NOTE — Progress Notes (Signed)
[facility] Progress Note   Subjective: Not responding to verbal stimuli, does not open eyes or follow commands.    Objective Vitals:   09/08/19 0725 09/08/19 1107 09/08/19 1130 09/08/19 1200  BP: (!) 105/58 (!) 92/42 (!) 97/57 109/63  Pulse: (!) 102 (!) 104 (!) 103 (!) 103  Resp: 17 15 14 15   Temp: 98.3 F (36.8 C) 98.5 F (36.9 C)    TempSrc: Oral Oral    SpO2: 98% 99% 99% 100%  Weight:      Height:       Physical Exam General: Chronically ill appearing female in NAD Heart: S3,M1 2/6 systolic M HR 962 ST on monitor Lungs: CTAB Abdomen: active BS ND, NT Extremities: BLE trace-1+ pitting edema, pedal edema Dialysis Access: L AVF + bruit. R arm with large area of ecchymosis.    Additional Objective Labs: Basic Metabolic Panel: Recent Labs  Lab 09/06/19 0643 09/07/19 0516 09/08/19 0500  NA 135 138 138  K 3.7 3.6 3.5  CL 102 99 100  CO2 22 23 23   GLUCOSE 117* 58* 136*  BUN 10 5* 7*  CREATININE 3.82* 2.67* 3.63*  CALCIUM 7.1* 7.2* 7.3*  PHOS 3.4 2.5  --    Liver Function Tests: Recent Labs  Lab 08/13/2019 1833 09/01/2019 1833 09/06/19 0643 09/07/19 0516 09/08/19 0500  AST 41  --   --   --  125*  ALT 22  --   --   --  39  ALKPHOS 52  --   --   --  85  BILITOT 1.4*  --   --   --  1.5*  PROT 4.3*  --   --   --  3.8*  ALBUMIN 2.2*   < > 2.0* 1.8* 1.6*   < > = values in this interval not displayed.   No results for input(s): LIPASE, AMYLASE in the last 168 hours. CBC: Recent Labs  Lab 09/02/19 1235 09/02/19 1434 09/03/19 0250 09/03/19 0250 09/03/19 0727 09/03/19 1100 08/10/2019 0725 08/26/2019 1542 09/06/19 2000 09/07/19 0516 09/08/19 0500  WBC 7.0   < > 7.6   < > 7.6  --  7.9  --   --  7.8  --   HGB 8.6*   < > 9.1*   < > 9.9*   < > 6.8*   < > 9.7* 9.6* 9.5*  HCT 26.6*   < > 27.8*   < > 30.9*   < > 21.3*   < > 28.6* 28.4* 28.7*  MCV 94.3  --  94.6  --  96.3  --  96.8  --   --  92.2  --   PLT 139*   < > 144*   < > 159  --  129*  --   --   129*  --    < > = values in this interval not displayed.   Blood Culture    Component Value Date/Time   SDES URINE, CLEAN CATCH 01/04/2018 1524   SPECREQUEST  01/04/2018 1524    NONE Performed at [facility] [address], [place], [place] [address]    CULT MULTIPLE SPECIES PRESENT, SUGGEST RECOLLECTION (A) 01/04/2018 1524   REPTSTATUS 01/05/2018 FINAL 01/04/2018 1524    Cardiac Enzymes: No results for input(s): CKTOTAL, CKMB, CKMBINDEX, TROPONINI in the last 168 hours. CBG: Recent Labs  Lab 09/07/19 1638 09/07/19 2046 09/08/19 0631 09/08/19 0852 09/08/19 1109  GLUCAP 69* 98 118* 112* 147*   Iron Studies:  No results for input(s): IRON, TIBC, TRANSFERRIN, FERRITIN in the last 72 hours. @lablastinr3 @ Studies/Results: CT HEAD WO CONTRAST  Result Date: 09/07/2019 CLINICAL DATA:  Altered mental status (AMS), unclear cause. EXAM: CT HEAD WITHOUT CONTRAST TECHNIQUE: Contiguous axial images were obtained from the base of the skull through the vertex without intravenous contrast. COMPARISON:  Head CT 08/23/2019, brain MRI 06/07/2019 FINDINGS: Brain: There is no evidence of acute intracranial hemorrhage, intracranial mass, midline shift or extra-axial fluid collection.Redemonstrated chronic cortically based left parietal lobe infarct. Redemonstrated chronic lacunar infarct within the left basal ganglia and adjacent white matter. No new demarcated infarct is identified. Background mild ill-defined hypoattenuation within the cerebral white matter is nonspecific, but consistent with chronic small vessel ischemic disease. Stable, mild generalized parenchymal atrophy. Partially empty sella turcica. Vascular: No hyperdense vessel.  Atherosclerotic calcifications Skull: Normal. Negative for fracture or focal lesion. Sinuses/Orbits: Frothy secretions and air-fluid level within the left maxillary sinus. Small right frontal sinus osteoma. Sequela of prior right mastoidectomy. The remaining right  mastoid air cells and mastoidectomy cavity are well aerated. No significant left mastoid effusion. Visualized orbits demonstrate no acute abnormality. IMPRESSION: 1. No evidence of acute intracranial abnormality. 2. Redemonstrated chronic infarcts within the left parietal lobe, as well as left basal ganglia and adjacent white matter. 3. Stable mild background generalized parenchymal atrophy and chronic small vessel ischemic disease. 4. Left maxillary sinusitis. Electronically Signed   By: Kellie Simmering DO   On: 09/07/2019 10:14   DG CHEST PORT 1 VIEW  Result Date: 09/07/2019 CLINICAL DATA:  Altered mental status EXAM: PORTABLE CHEST 1 VIEW COMPARISON:  08/17/2019 FINDINGS: Right IJ central line is unchanged. Low lung volumes. Persistent left basilar atelectasis. No pleural effusion or pneumothorax. Stable cardiomediastinal contours. Partially imaged right reverse shoulder arthroplasty. IMPRESSION: No substantial change.  Persistent left basilar atelectasis. Electronically Signed   By: Macy Mis M.D.   On: 09/07/2019 08:32   Medications: . sodium chloride Stopped (09/07/19 1550)  . sodium chloride    . sodium chloride 600 mL/hr at 09/06/2019 1649  . dextrose 5 % and 0.45% NaCl 75 mL/hr at 09/08/19 0400  . dextrose 5 % and 0.45% NaCl    . methocarbamol (ROBAXIN) IV    . TPN ADULT (ION)     . acetaminophen  650 mg Oral Q6H  . calcitRIOL  1 mcg Oral Q M,W,F-HD  . Chlorhexidine Gluconate Cloth  6 each Topical Q0600  . darbepoetin (ARANESP) injection - DIALYSIS  100 mcg Intravenous Q Wed-HD  . dextrose  12.5 g Intravenous STAT  . docusate sodium  100 mg Oral BID  . feeding supplement  1 Container Oral TID BM  . feeding supplement (PRO-STAT SUGAR FREE 64)  30 mL Oral TID  . insulin aspart  0-6 Units Subcutaneous Q4H  . levothyroxine  50 mcg Oral QAC breakfast  . multivitamin  1 tablet Oral QHS  . pantoprazole  40 mg Oral Q0600  . phytonadione  10 mg Oral Daily  . sodium chloride flush  10-40 mL  Intracatheter Q12H     Dialysis Orders: [facility] MWF  3.5 hr EDW 85 400/A 1.5 3 K 2.25 Ca -Heparin 4000 units IV initial bolus with Heparin 1500 units IV  mid tmt  -Mircera 100 mcg IV q 2 wks -last had 60 on 3/10  -calcitriol 1.0 mcg PO TIW  Binders: fosrenol 2 gm PO TID ac  Assessment/Plan: 1. ABLA 2/2 recurrent diverticular GI bleeding - s/p colonoscopy -->6 polyps removed/non bleeding  rectal ulcer. Bleeding recurred and now s/p IR embolization.Bleeding scan 3/28  + active bleed RUQ near hepatic flexure - same area of prior embolization.Ongoing bloody stools. S/p 6 units prbcs-->>GI/CCS following -s/p right hemicolectomy 3/30 2. AMS-being worked up per Neurology. I sent patient to ED from HD 03/17 for AMS, inability to speak, staring into space but was sent home from ED without intervention. Per primary 3. ESRD- HD MWF. No heparin on HD. HD today on schedule. 4. AVF issues -recent bleeding issues with access - no heparin HD. Arm remains swollen but AVF functional. Per dialysis RN, no further bleeding issues noted as of 3/31 5. Hypotension/volume- BPlow/stable. UF as tolerated. Using alb, prn to support BP. Is now below her outpatient dry weight.  6. Anemia  Hgb trending down d/t ongoing GI bleed. S/p 6 units prbcs -last 3/30. Hgb 9.7. Continue Aranesp 113mcg q Wed. 7. Metabolic bone disease- Phos low without binders/Corr Ca ok.  On Calcitriol for Ca support.  8. Severe malnutrition- Alb < 2.  Continue pro-stat + nepro. 9. CAD - Coreg on hold d/t hypotension  10. DM - Insulin per primary  11. GOC - for d/c to SNF when medically stable -  Joi Leyva H. Emmanuela Ghazi NP-C 09/08/2019, 12:57 PM  [employer] [phone]

## 2019-09-09 ENCOUNTER — Inpatient Hospital Stay (HOSPITAL_COMMUNITY): Payer: Medicare Other

## 2019-09-09 LAB — GLUCOSE, CAPILLARY
Glucose-Capillary: 154 mg/dL — ABNORMAL HIGH (ref 70–99)
Glucose-Capillary: 162 mg/dL — ABNORMAL HIGH (ref 70–99)
Glucose-Capillary: 165 mg/dL — ABNORMAL HIGH (ref 70–99)
Glucose-Capillary: 173 mg/dL — ABNORMAL HIGH (ref 70–99)
Glucose-Capillary: 177 mg/dL — ABNORMAL HIGH (ref 70–99)
Glucose-Capillary: 194 mg/dL — ABNORMAL HIGH (ref 70–99)

## 2019-09-09 LAB — TSH: TSH: 2.331 u[IU]/mL (ref 0.350–4.500)

## 2019-09-09 LAB — COMPREHENSIVE METABOLIC PANEL
ALT: 39 U/L (ref 0–44)
AST: 113 U/L — ABNORMAL HIGH (ref 15–41)
Albumin: 1.5 g/dL — ABNORMAL LOW (ref 3.5–5.0)
Alkaline Phosphatase: 98 U/L (ref 38–126)
Anion gap: 14 (ref 5–15)
BUN: 8 mg/dL (ref 8–23)
CO2: 23 mmol/L (ref 22–32)
Calcium: 7 mg/dL — ABNORMAL LOW (ref 8.9–10.3)
Chloride: 99 mmol/L (ref 98–111)
Creatinine, Ser: 3.1 mg/dL — ABNORMAL HIGH (ref 0.44–1.00)
GFR calc Af Amer: 17 mL/min — ABNORMAL LOW (ref >60)
GFR calc non Af Amer: 15 mL/min — ABNORMAL LOW (ref >60)
Glucose, Bld: 173 mg/dL — ABNORMAL HIGH (ref 70–99)
Potassium: 3.3 mmol/L — ABNORMAL LOW (ref 3.5–5.1)
Sodium: 136 mmol/L (ref 135–145)
Total Bilirubin: 1.3 mg/dL — ABNORMAL HIGH (ref 0.3–1.2)
Total Protein: 4.2 g/dL — ABNORMAL LOW (ref 6.5–8.1)

## 2019-09-09 LAB — DIFFERENTIAL
Abs Immature Granulocytes: 0.07 10*3/uL (ref 0.00–0.07)
Basophils Absolute: 0 10*3/uL (ref 0.0–0.1)
Basophils Relative: 0 %
Eosinophils Absolute: 0.2 10*3/uL (ref 0.0–0.5)
Eosinophils Relative: 2 %
Immature Granulocytes: 1 %
Lymphocytes Relative: 7 %
Lymphs Abs: 0.6 10*3/uL — ABNORMAL LOW (ref 0.7–4.0)
Monocytes Absolute: 0.3 10*3/uL (ref 0.1–1.0)
Monocytes Relative: 3 %
Neutro Abs: 7.1 10*3/uL (ref 1.7–7.7)
Neutrophils Relative %: 87 %

## 2019-09-09 LAB — CBC
HCT: 30.3 % — ABNORMAL LOW (ref 36.0–46.0)
Hemoglobin: 9.8 g/dL — ABNORMAL LOW (ref 12.0–15.0)
MCH: 31.2 pg (ref 26.0–34.0)
MCHC: 32.3 g/dL (ref 30.0–36.0)
MCV: 96.5 fL (ref 80.0–100.0)
Platelets: 98 10*3/uL — ABNORMAL LOW (ref 150–400)
RBC: 3.14 MIL/uL — ABNORMAL LOW (ref 3.87–5.11)
RDW: 22.5 % — ABNORMAL HIGH (ref 11.5–15.5)
WBC: 8.3 10*3/uL (ref 4.0–10.5)
nRBC: 1.1 % — ABNORMAL HIGH (ref 0.0–0.2)

## 2019-09-09 LAB — MAGNESIUM: Magnesium: 1.5 mg/dL — ABNORMAL LOW (ref 1.7–2.4)

## 2019-09-09 LAB — PHOSPHORUS: Phosphorus: 1.3 mg/dL — ABNORMAL LOW (ref 2.5–4.6)

## 2019-09-09 LAB — PREALBUMIN: Prealbumin: 5.3 mg/dL — ABNORMAL LOW (ref 18–38)

## 2019-09-09 LAB — TRIGLYCERIDES: Triglycerides: 171 mg/dL — ABNORMAL HIGH (ref <150)

## 2019-09-09 LAB — SEDIMENTATION RATE: Sed Rate: 8 mm/hr (ref 0–22)

## 2019-09-09 LAB — C-REACTIVE PROTEIN: CRP: 25.3 mg/dL — ABNORMAL HIGH (ref <1.0)

## 2019-09-09 MED ORDER — TRAVASOL 10 % IV SOLN
INTRAVENOUS | Status: AC
  Filled 2019-09-09: qty 533.76

## 2019-09-09 MED ORDER — KCL IN DEXTROSE-NACL 40-5-0.45 MEQ/L-%-% IV SOLN
INTRAVENOUS | Status: DC
  Filled 2019-09-09: qty 1000

## 2019-09-09 MED ORDER — SODIUM CHLORIDE 0.9 % IV BOLUS
250.0000 mL | Freq: Once | INTRAVENOUS | Status: AC
  Administered 2019-09-09: 250 mL via INTRAVENOUS

## 2019-09-09 MED ORDER — MAGNESIUM SULFATE 2 GM/50ML IV SOLN
2.0000 g | Freq: Once | INTRAVENOUS | Status: AC
  Administered 2019-09-09: 2 g via INTRAVENOUS
  Filled 2019-09-09: qty 50

## 2019-09-09 MED ORDER — POTASSIUM PHOSPHATES 15 MMOLE/5ML IV SOLN
10.0000 mmol | Freq: Once | INTRAVENOUS | Status: AC
  Administered 2019-09-09: 10 mmol via INTRAVENOUS
  Filled 2019-09-09: qty 3.33

## 2019-09-09 NOTE — Significant Event (Addendum)
Rapid Response Event Note   Came up to see Roberta Bryant as a follow up. She awake this morning, able to say a few short phrases - "slow down" or "I want to drink". Responses are delayed, still not quite able to follow commands, but able to track me across the room  I gave ger a few sips of water - patient tolerated that well, I feel she needs to have a SLP just to be safe. Pending MRI/EEG - I updated nurse and MD as well   Gotti Alwin R

## 2019-09-09 NOTE — Progress Notes (Signed)
PROGRESS NOTE    Roberta Bryant  TGG:[id number] DOB: 07-10-1950 DOA: 08/23/2019 PCP: [name], MD   Brief Narrative:  HPI on 08/15/2019 by [name], NP (ICU) 69 y/o F who presented to [facility] on 3/21 with reports of dark stools and altered mental status.    The patient was seen on 3/17 in the ER after she was noted to be altered staring off and not talking during HD. She reported feeling generalized weakness and missed HD on 3/15.  Only completed 50 minutes of HD on 3/17. Her Hgb at that time was 8.8. She reports she had bleeding from her fistula on Friday 3/19 that was brisk and difficult to control.   She returns to the ER on 3/21 with reports of black-red stools, weakness, abdominal pain.  EMS found her to have a BP of 90/50 and treated her with 693ml NS.  BP improved to 116/50.  She was awake/alert on arrival with SBP's remaining in the 90's.  Her last HD session was 3/19.  The patient's initial labs concerning for worsening anemia with Hgb of 5.4 (iSTAT).  She was treated with 2 units blood in the ER.  CXR was notable for bibasilar atelectasis and low lung volumes but no acute process.  She was FOBT positive on presentation with subsequent melanic stools in the ER.  GI, Nephrology consulted.    PCCM consulted for admission.    Interim history Patient admitted with GI bleed, status post EGD and colonoscopy found to have nonbleeding erosive gastropathy, peptic duodenitis, removal of 6 polyps from the right and left colon.  Pathology showed tubular adenomas.  She continued to have rectal bleeding, CTA 08/31/2019 with active bleeding in the hepatic flexure and had embolization done.  She continues to have intermittent rectal bleeding and requires blood transfusions.  Patient did have bleeding scan which is positive for bleeding at the hepatic flexure at the previous embolization site.  General surgery consulted after IR recommendation, status post right hemicolectomy. Continues to  have fluctuating mental status, neurology consulted. Assessment & Plan   Recurrent diverticular GI bleeding/acute blood loss anemia/anemia of chronic kidney disease -Gastroenterology consult appreciated -Status post EGD 3/22: Nonbleeding erosive gastropathy, peptic duodenitis -Status post colonoscopy 3/23: 6 polyps removed from the right and left colon, left colon diverticulosis, no stigmata of bleeding.  Pathology shows tubular adenomas -Status post CTA which showed active bleeding at the hepatic flexure, underwent arteriogram and embolization on 08/31/2019 -Continued to have bleeding, NM bleeding scan obtained on 09/02/2019 which showed hepatic flexure bleeding again. -Interventional radiology, and general surgery consulted and appreciated -Interventional radiology feels that other attempted embolizing could lead to ischemic bowel/necrosis which could necessitate emergent colectomy -General surgery on 08/12/2019 performed right hemicolectomy -Was admitted with hemoglobin of 4.9 and has received 5 units of PRBC to date -hemoglobin 9.8 -Currently on Aranesp -Anemia panel showed adequate iron storage on 08/24/2019 -Continue to monitor H&H and transfuse as needed  Bleeding from dialysis fistula -Improved  End-stage renal disease -Patient dialyzes Monday, Wednesday, Friday -Nephrology consulted and appreciated  LUE aVF pulsatile with LUE larger than RUE ?CV Stenosis -Per nephrology, patient will need fistulogram once stable  Thrombocytopenia -have improved, 129 today -Need to monitor CBC  Elevated INR -Present on admission, has now improved, 1.5 today -Currently receiving vitamin K daily  Essential hypertension -BP has been soft, requiring patient to receive several IV fluid boluses as well as blood transfusion -Continue to monitor closely  Anxiety/depression -Continue Klonopin  Metabolic bone disease -Continue calcitriol  Diabetes mellitus, type II -Hemoglobin A1c was 4.9  (08/10/2019)  -Januvia currently held -Patient has had intermittent hypoglycemia- started on D5 1/2 NS  -Placed on insulin sliding scale with CBG monitoring  Hypothyroidism -Continue Synthroid  Coronary artery disease -Patient follows with cardiologist at [facility] stress test was in 2019 showing an EF of 52%, low risk study -Currently no complaints of chest pain -Aspirin held due to GI bleed -Coreg held due to soft BP  Intermittent mild confusion/acute metabolic encephalopathy -Continues to have intermittent confusion -Question whether this is due to GI bleeding versus other cause -Continue to monitor closely -CT head: no evidence of acute intracranial abnormality. Chronic infarcts L parietal lobe, left basal ganglia, adjacent white matter  -Chest x-ray unremarkable for infection -ABG within normal limits, mild hypoxia -Ammonia level 33 -Discussed with son via phone, states patient has had intermittent confusion hallucinations prior to admission. Mental status worsened some after COVID infection in February 2021. Has been seeing Dr. Jannifer Franklin, neurology. Was diagnosed with Parkinsons, but states this was an incorrect diagnosis. Also states patient has had lower extremity weakness and gait abnormalities. -?underlying dementia -have reviewed outpatient neurology notes, no mention of mental/memory impairment or AMS -Neurology consulted and appreciated -Have ordered MRI and EEG  Goals of care -Palliative care consulted and appreciated  Deconditioning -Continue PT/OT-recommended SNF  Poor oral intake -Has had inadequate oral intake, likely due to GI function -Will continue to monitor -Nutrition consulted, continue supplements -Speech therapy consulted -Started on TPN   DVT Prophylaxis  SCDs  Code Status: Full  Family Communication: None at bedside. Son via phone  Disposition Plan: Admitted from home for GI bleeding and anemia. S/p R hemicolectomy on 3/30. Pending further  surgical recommendations and stabilization of hemoglobin, as well as mental status. Pending w/up.  Suspect SNF when stable.   Consultants Neurosurgery Nephrology Gastroenterology Interventional radiology PCCM Neurology  Procedures  08/15/2019 EGD: Afew gastric polyps biopsied: hyperplastic polyp with eroded granulation tissue and surface reactive change. Mild, nonbleeding, erosive gastropathy biopsied:Mild reactive gastropathy with some surface hyperplastic change, no H. Pylori.Duodenal nodule biopsied:peptic duodenitis.   08/27/2019 colonoscopy: 6 polyps removed located in right and left colon. Left colon diverticulosis. Solitary rectal ulcer, no stigmata of bleeding. Pathology shows tubular adenomas,noHGD. Ulcer not biopsied.   08/31/2019: IR arteriogram with bleeding in hepatic flexure, status post embolization  08/13/2019: Right hemicolectomy  Antibiotics   Anti-infectives (From admission, onward)   Start     Dose/Rate Route Frequency Ordered Stop   09/06/2019 1015  cefoTEtan (CEFOTAN) 2 g in sodium chloride 0.9 % 100 mL IVPB     2 g 200 mL/hr over 30 Minutes Intravenous  Once 09/04/2019 1002 08/31/2019 1600      Subjective:   Roberta Bryant seen and examined today.  Feeling tired this morning and wanting to drink something.  Feels very cold today.  Denies current chest pain or shortness of breath, dizziness or headache. Objective:   Vitals:   09/09/19 0248 09/09/19 0425 09/09/19 0601 09/09/19 0800  BP: 97/64 91/63 105/70   Pulse: (!) 114 (!) 117 (!) 114 (!) 118  Resp:  16    Temp:  99.2 F (37.3 C)    TempSrc:  Axillary    SpO2: 98% 98% 99% 98%  Weight:  87.8 kg    Height:        Intake/Output Summary (Last 24 hours) at 09/09/2019 0912 Last data filed at 09/09/2019 0300 Gross per 24 hour  Intake 1031.54 ml  Output -45 ml  Net 1076.54 ml   Filed Weights   09/08/19 0431 09/08/19 1400 09/09/19 0425  Weight: 86.6 kg 86.6 kg 87.8 kg   Exam  General: Well  developed, chronically ill appearing, NAD  HEENT: NCAT,mucous membranes moist.   Neck: Supple, no JVD, no masses  Cardiovascular: S1 S2 auscultated, RRR, SEM  Respiratory: Clear to auscultation bilaterally   Abdomen: Soft, obese, tender around the incision site, nondistended, + bowel sounds  Extremities: warm dry without cyanosis clubbing or edema  Neuro: AAOx1 (self), slow to respond  Data Reviewed: I have personally reviewed following labs and imaging studies  CBC: Recent Labs  Lab 09/03/19 0250 09/03/19 0250 09/03/19 0727 09/03/19 1100 08/24/2019 0725 08/30/2019 1542 09/06/19 1430 09/06/19 2000 09/07/19 0516 09/08/19 0500 09/09/19 0500  WBC 7.6  --  7.6  --  7.9  --   --   --  7.8  --  8.3  NEUTROABS  --   --   --   --   --   --   --   --   --   --  7.1  HGB 9.1*   < > 9.9*   < > 6.8*   < > 9.8* 9.7* 9.6* 9.5* 9.8*  HCT 27.8*   < > 30.9*   < > 21.3*   < > 28.7* 28.6* 28.4* 28.7* 30.3*  MCV 94.6  --  96.3  --  96.8  --   --   --  92.2  --  96.5  PLT 144*  --  159  --  129*  --   --   --  129*  --  98*   < > = values in this interval not displayed.   Basic Metabolic Panel: Recent Labs  Lab 08/26/2019 1833 09/06/19 0643 09/07/19 0516 09/08/19 0500 09/09/19 0500  NA 136 135 138 138 136  K 3.4* 3.7 3.6 3.5 3.3*  CL 101 102 99 100 99  CO2 22 22 23 23 23   GLUCOSE 116* 117* 58* 136* 173*  BUN 8 10 5* 7* 8  CREATININE 3.48* 3.82* 2.67* 3.63* 3.10*  CALCIUM 7.1* 7.1* 7.2* 7.3* 7.0*  MG  --   --   --   --  1.5*  PHOS  --  3.4 2.5  --  1.3*   GFR: Estimated Creatinine Clearance: 16.1 mL/min (A) (by C-G formula based on SCr of 3.1 mg/dL (H)). Liver Function Tests: Recent Labs  Lab 08/19/2019 1833 09/06/19 0643 09/07/19 0516 09/08/19 0500 09/09/19 0500  AST 41  --   --  125* 113*  ALT 22  --   --  39 39  ALKPHOS 52  --   --  85 98  BILITOT 1.4*  --   --  1.5* 1.3*  PROT 4.3*  --   --  3.8* 4.2*  ALBUMIN 2.2* 2.0* 1.8* 1.6* 1.5*   No results for input(s):  LIPASE, AMYLASE in the last 168 hours. Recent Labs  Lab 09/07/19 1223  AMMONIA 33   Coagulation Profile: Recent Labs  Lab 09/03/19 0727 09/04/19 0851 08/18/2019 1833 09/06/19 0632 09/07/19 0516  INR 1.3* 1.3* 1.6* 1.4* 1.5*   Cardiac Enzymes: No results for input(s): CKTOTAL, CKMB, CKMBINDEX, TROPONINI in the last 168 hours. BNP (last 3 results) No results for input(s): PROBNP in the last 8760 hours. HbA1C: No results for input(s): HGBA1C in the last 72 hours. CBG: Recent Labs  Lab 09/08/19 1643 09/08/19 2057  09/09/19 0036 09/09/19 0428 09/09/19 0812  GLUCAP 117* 150* 194* 173* 165*   Lipid Profile: Recent Labs    09/09/19 0500  TRIG 171*   Thyroid Function Tests: No results for input(s): TSH, T4TOTAL, FREET4, T3FREE, THYROIDAB in the last 72 hours. Anemia Panel: No results for input(s): VITAMINB12, FOLATE, FERRITIN, TIBC, IRON, RETICCTPCT in the last 72 hours. Urine analysis:    Component Value Date/Time   COLORURINE YELLOW 01/04/2018 1524   APPEARANCEUR CLEAR 01/04/2018 1524   LABSPEC 1.010 01/04/2018 1524   PHURINE 8.5 (H) 01/04/2018 1524   GLUCOSEU NEGATIVE 01/04/2018 1524   HGBUR NEGATIVE 01/04/2018 1524   [place] 01/04/2018 1524   KETONESUR NEGATIVE 01/04/2018 1524   PROTEINUR 100 (A) 01/04/2018 1524   UROBILINOGEN 0.2 10/30/2014 1801   NITRITE NEGATIVE 01/04/2018 1524   LEUKOCYTESUR SMALL (A) 01/04/2018 1524   Sepsis Labs: @LABRCNTIP (procalcitonin:4,lacticidven:4)  ) No results found for this or any previous visit (from the past 240 hour(s)).    Radiology Studies: CT HEAD WO CONTRAST  Result Date: 09/07/2019 CLINICAL DATA:  Altered mental status (AMS), unclear cause. EXAM: CT HEAD WITHOUT CONTRAST TECHNIQUE: Contiguous axial images were obtained from the base of the skull through the vertex without intravenous contrast. COMPARISON:  Head CT 08/23/2019, brain MRI 06/07/2019 FINDINGS: Brain: There is no evidence of acute intracranial  hemorrhage, intracranial mass, midline shift or extra-axial fluid collection.Redemonstrated chronic cortically based left parietal lobe infarct. Redemonstrated chronic lacunar infarct within the left basal ganglia and adjacent white matter. No new demarcated infarct is identified. Background mild ill-defined hypoattenuation within the cerebral white matter is nonspecific, but consistent with chronic small vessel ischemic disease. Stable, mild generalized parenchymal atrophy. Partially empty sella turcica. Vascular: No hyperdense vessel.  Atherosclerotic calcifications Skull: Normal. Negative for fracture or focal lesion. Sinuses/Orbits: Frothy secretions and air-fluid level within the left maxillary sinus. Small right frontal sinus osteoma. Sequela of prior right mastoidectomy. The remaining right mastoid air cells and mastoidectomy cavity are well aerated. No significant left mastoid effusion. Visualized orbits demonstrate no acute abnormality. IMPRESSION: 1. No evidence of acute intracranial abnormality. 2. Redemonstrated chronic infarcts within the left parietal lobe, as well as left basal ganglia and adjacent white matter. 3. Stable mild background generalized parenchymal atrophy and chronic small vessel ischemic disease. 4. Left maxillary sinusitis. Electronically Signed   By: Kellie Simmering DO   On: 09/07/2019 10:14   DG Abd Portable 1V  Result Date: 09/09/2019 CLINICAL DATA:  Ileus EXAM: PORTABLE ABDOMEN - 1 VIEW COMPARISON:  Plain film of the abdomen dated 07/16/2003. FINDINGS: Diffusely distended gas-filled loops of small bowel within the abdomen and pelvis, measuring up to approximately 5 cm diameter. Upper abdomen is excluded. Surgical staples overlie the RIGHT lower abdomen. Surgical clips are also seen in the RIGHT upper quadrant, consistent with cholecystectomy. IMPRESSION: Diffusely distended gas-filled loops of small bowel within the abdomen and pelvis, measuring up to 5 cm diameter, compatible with  the given history of ileus. Electronically Signed   By: Franki Cabot M.D.   On: 09/09/2019 09:00     Scheduled Meds: . acetaminophen  650 mg Oral Q6H  . calcitRIOL  1 mcg Oral Q M,W,F-HD  . Chlorhexidine Gluconate Cloth  6 each Topical Q0600  . darbepoetin (ARANESP) injection - DIALYSIS  100 mcg Intravenous Q Wed-HD  . docusate sodium  100 mg Oral BID  . feeding supplement  1 Container Oral TID BM  . feeding supplement (PRO-STAT SUGAR FREE 64)  30 mL Oral  TID  . insulin aspart  0-6 Units Subcutaneous Q4H  . levothyroxine  50 mcg Oral QAC breakfast  . pantoprazole  40 mg Oral Q0600  . phytonadione  10 mg Oral Daily  . sodium chloride flush  10-40 mL Intracatheter Q12H   Continuous Infusions: . sodium chloride Stopped (09/07/19 1550)  . sodium chloride    . sodium chloride 600 mL/hr at 08/16/2019 1649  . dextrose 5 % and 0.45 % NaCl with KCl 40 mEq/L    . magnesium sulfate bolus IVPB    . potassium PHOSPHATE IVPB (in mmol)    . thiamine injection 500 mg (09/08/19 2206)  . TPN ADULT (ION) 40 mL/hr at 09/08/19 1708  . TPN ADULT (ION)       LOS: 13 days   Time Spent in minutes   45 minutes  Flynn Gwyn D.O. on 09/09/2019 at 9:12 AM  Between 7am to 7pm - Please see pager noted on [email or web address]  After 7pm go to [email or web address]  And look for the night coverage person covering for me after hours  [facility] Office  [phone]

## 2019-09-09 NOTE — Progress Notes (Signed)
[facility] Progress Note  4 Days Post-Op  Subjective: CC-  Opens eyes but does not answer questions this morning. Abdomen soft. Unsure if passing any flatus. No BM. Started TPN yesterday. Neurology consulted yesterday for assistance with AMS workup, thus far negative.  Objective: Vital signs in last 24 hours: Temp:  [97.5 F (36.4 C)-100.1 F (37.8 C)] 99.2 F (37.3 C) (04/03 0425) Pulse Rate:  [84-119] 114 (04/03 0601) Resp:  [14-18] 16 (04/03 0425) BP: (68-116)/(36-79) 105/70 (04/03 0601) SpO2:  [97 %-100 %] 99 % (04/03 0601) Weight:  [86.6 kg-87.8 kg] 87.8 kg (04/03 0425) Last BM Date: 09/06/19  Intake/Output from previous day: 04/02 0701 - 04/03 0700 In: 1031.5 [P.O.:60; I.V.:674.9; IV Piggyback:296.6] Out: -45  Intake/Output this shift: No intake/output data recorded.  PE: Gen: Alert, NAD Pulm: Normal rate and effort Abd: obese, soft, ND,appropriately tender around midline incision, hypoactive bowel sounds. Open midline incision cdi with staples intact and no erythema or drainage Skin: no rashes noted, warm and dry  Lab Results:  Recent Labs    09/07/19 0516 09/07/19 0516 09/08/19 0500 09/09/19 0500  WBC 7.8  --   --  8.3  HGB 9.6*   < > 9.5* 9.8*  HCT 28.4*   < > 28.7* 30.3*  PLT 129*  --   --  98*   < > = values in this interval not displayed.   BMET Recent Labs    09/08/19 0500 09/09/19 0500  NA 138 136  K 3.5 3.3*  CL 100 99  CO2 23 23  GLUCOSE 136* 173*  BUN 7* 8  CREATININE 3.63* 3.10*  CALCIUM 7.3* 7.0*   PT/INR Recent Labs    09/07/19 0516  LABPROT 18.1*  INR 1.5*   CMP     Component Value Date/Time   NA 136 09/09/2019 0500   K 3.3 (L) 09/09/2019 0500   CL 99 09/09/2019 0500   CO2 23 09/09/2019 0500   GLUCOSE 173 (H) 09/09/2019 0500   BUN 8 09/09/2019 0500   CREATININE 3.10 (H) 09/09/2019 0500   CALCIUM 7.0 (L) 09/09/2019 0500   CALCIUM 7.4 (L) 09/12/2008 0615   PROT 4.2 (L) 09/09/2019 0500   ALBUMIN 1.5  (L) 09/09/2019 0500   AST 113 (H) 09/09/2019 0500   ALT 39 09/09/2019 0500   ALKPHOS 98 09/09/2019 0500   BILITOT 1.3 (H) 09/09/2019 0500   GFRNONAA 15 (L) 09/09/2019 0500   GFRAA 17 (L) 09/09/2019 0500   Lipase     Component Value Date/Time   LIPASE 43 07/19/2018 2330       Studies/Results: CT HEAD WO CONTRAST  Result Date: 09/07/2019 CLINICAL DATA:  Altered mental status (AMS), unclear cause. EXAM: CT HEAD WITHOUT CONTRAST TECHNIQUE: Contiguous axial images were obtained from the base of the skull through the vertex without intravenous contrast. COMPARISON:  Head CT 08/23/2019, brain MRI 06/07/2019 FINDINGS: Brain: There is no evidence of acute intracranial hemorrhage, intracranial mass, midline shift or extra-axial fluid collection.Redemonstrated chronic cortically based left parietal lobe infarct. Redemonstrated chronic lacunar infarct within the left basal ganglia and adjacent white matter. No new demarcated infarct is identified. Background mild ill-defined hypoattenuation within the cerebral white matter is nonspecific, but consistent with chronic small vessel ischemic disease. Stable, mild generalized parenchymal atrophy. Partially empty sella turcica. Vascular: No hyperdense vessel.  Atherosclerotic calcifications Skull: Normal. Negative for fracture or focal lesion. Sinuses/Orbits: Frothy secretions and air-fluid level within the left maxillary sinus. Small right frontal sinus osteoma. Sequela of  prior right mastoidectomy. The remaining right mastoid air cells and mastoidectomy cavity are well aerated. No significant left mastoid effusion. Visualized orbits demonstrate no acute abnormality. IMPRESSION: 1. No evidence of acute intracranial abnormality. 2. Redemonstrated chronic infarcts within the left parietal lobe, as well as left basal ganglia and adjacent white matter. 3. Stable mild background generalized parenchymal atrophy and chronic small vessel ischemic disease. 4. Left  maxillary sinusitis. Electronically Signed   By: Kellie Simmering DO   On: 09/07/2019 10:14   DG CHEST PORT 1 VIEW  Result Date: 09/07/2019 CLINICAL DATA:  Altered mental status EXAM: PORTABLE CHEST 1 VIEW COMPARISON:  09/04/2019 FINDINGS: Right IJ central line is unchanged. Low lung volumes. Persistent left basilar atelectasis. No pleural effusion or pneumothorax. Stable cardiomediastinal contours. Partially imaged right reverse shoulder arthroplasty. IMPRESSION: No substantial change.  Persistent left basilar atelectasis. Electronically Signed   By: Macy Mis M.D.   On: 09/07/2019 08:32    Anti-infectives: Anti-infectives (From admission, onward)   Start     Dose/Rate Route Frequency Ordered Stop   08/23/2019 1015  cefoTEtan (CEFOTAN) 2 g in sodium chloride 0.9 % 100 mL IVPB     2 g 200 mL/hr over 30 Minutes Intravenous  Once 08/23/2019 1002 08/15/2019 1600       Assessment/Plan ESRD - dialysis on MWF History of CHF HTN CAD History of stroke DM2 Deconditioning HxCovid about 8 weeks ago Protein Calorie Malnutrition - Per-Alb 5.8 (4/2), continue TPN AMS - workup per primary, neurology  Lower GI bleed ABL Anemia -s/p right hemicolectomy - Dr. Ninfa Linden - 08/21/2019 - POD #4 -ContCLD and await return in bowel function. Continue TPN. Check abdominal film. - Hgb stable at 9.8this AM - WBC WNL, afebrile - Mobilize, PT. PT currently recommending SNF - Pulm toilet, IS  FEN -CLD. TPN VTE -SCDs only, thrombocytopenia ID -Cefotetan peri-op Foley -None Follow up -Dr. Ninfa Linden   LOS: 13 days    Roberta Bryant, [facility] 09/09/2019, 8:10 AM Please see Amion for pager number during day hours 7:00am-4:30pm

## 2019-09-09 NOTE — Significant Event (Signed)
Rapid Response Event Note  Overview:Called d/t BP-70/40.  Initial Focused Assessment: Pt laying in bed with eyes opened. Pt will not follow commands but will grimace to painful stimuli in all extremities(this not new). Skin hot to touch. T-99, HR-111, BP-70/40(repeat 68/59), RR-18, SpO2-97% RA.   Interventions: 250cc NS bolus Plan of Care (if not transferred): BP up to 89/56.  Finish bolus and monitor response. Call RRT if further assistance needed.  Event Summary: Bodenheimer, NP notified PTA RRT.   Called: 0120 Arrived: 0125 Ended: 0150        Dillard Essex

## 2019-09-09 NOTE — Progress Notes (Addendum)
PHARMACY - TOTAL PARENTERAL NUTRITION CONSULT NOTE   Indication: Prolonged ileus  Patient Measurements: Height: 4\' 10"  (147.3 cm) Weight: 87.8 kg (193 lb 8 oz) IBW/kg (Calculated) : 40.9 TPN AdjBW (KG): 52.3 Body mass index is 40.44 kg/m. Usual Weight: 85kg  Assessment:  27 yof admitted with ABLA in the setting of GIB. She required an EGD and colonoscopy found to have a diverticular bleed. Ultimately underwent a R hemicolectomy. To start TPN post-op as bowel function has not returned due to prolonged ileus.   Glucose / Insulin: Synthroid (given some, not given some). CBGs 112-194. 1 unit SSI. (GIF 1.25 TPN) Electrolytes: K 3.3 (goal >/= 4), Ca 7 adjusts to 9 (albumin 1.5). Phos 1.3 very low. Mg 1.5 low. (Ca*Phos product only 11.7) -  Vit K 10mg  po daily, thiamine 500mg  TID IV 4/2>4/5 Renal: ESRD on HD MWF LFTs / TGs: Tbili 1.3, AST 113, ALT 39 WNL.  Prealbumin / albumin: Albumin 1.5, prealbumin 5.3 Intake / Output; MIVF: D51/2NS at 43ml/hr GI Imaging: CT abd 3/25 - Focus of active GI bleeding at hepatic flexure of colon, which demonstrates wall thickening which could be the result of infection, ischemia, or inflammatory bowel disease. Additional thickened small bowel loop in the LEFT mid abdomen question enteritis, associated with dilatation of small bowel loops proximal to this site suggesting a component of obstruction. Question gastric polyps versus redundant mucosa; recommend correlation with prior endoscopy. Small amount of nonspecific perihepatic free fluid. Surgeries / Procedures:  3/22 EGD 3/23 Colonoscopy 3/30 Partial colectomy  Central access: CVC  TPN start date: 4/2  Nutritional Goals (per RD recommendation on 4/1): kCal: 1900-2100, Protein: 90-105 Goal TPN rate is 75 mL/hr (provides 100 g of protein and 2028 kcals per day)  Current Nutrition:  Clear liquids  Boost, Prostat: none charted  Plan:  Con't TPN at 19mL/hr at 1800 - provides 53g protein, 34g  lipids, 156g dextrose and 1081kcal meeting approximately 58% patient goals Electrolytes in TPN: 50mEq/L of Na, 74mEq/L of K, 45mEq/L of Ca, 71mEq/L of Mg, and 40mmol/L of Phos. Cl:Ac ratio 1:1 - Will not increase rate until electrolytes improve and stabilize.  Add standard MVI MWF and trace elements to TPN - remove chromium due to ESRD on HD. (d/c Renal MV while on TPN) - Replace electrolytes cautiously in ESRD pt:  Change IVF to D5 1/2 NS + 40K+ at 78ml/hr =57meq K+  Magnesium 2g IV x 1  Kphos IV 15mmol x 1  Increase Ca slightly in TPN Continue SSI q4h and adjust as needed  Monitor TPN labs on Mon/Thurs and tomorrow   [phone]: Notified by RN pt will be going down for MRI with TPN turned off and starting D10W at 7m/hr when returns.- cSR   Catlin Doria S. Alford Highland, PharmD, BCPS Clinical Staff Pharmacist [email or web address] Alford Highland, [place] 09/09/2019,7:32 AM

## 2019-09-09 NOTE — Progress Notes (Signed)
[facility] Progress Note   Subjective: Hypotensive/unresponsive event during dialysis 4/2.  Noted Rapid response call on HD and again overnight. Bolused 242mL. BP 90s this am. On TPN.  She is alert and responding to questions, oriented to self.  Asking to sit up in bed. Does not move extremities.   Objective Vitals:   09/09/19 0248 09/09/19 0425 09/09/19 0601 09/09/19 0800  BP: 97/64 91/63 105/70   Pulse: (!) 114 (!) 117 (!) 114 (!) 118  Resp:  16    Temp:  99.2 F (37.3 C)    TempSrc:  Axillary    SpO2: 98% 98% 99% 98%  Weight:  87.8 kg    Height:       Physical Exam General: Chronically ill appearing female in NAD Heart: RRR 2/6 systolic  Lungs: CTAB Abdomen: soft non-tender, non-distended  Extremities: BLE trace-1+ pitting edema, pedal edema Dialysis Access: L AVF + bruit.  Additional Objective Labs: Basic Metabolic Panel: Recent Labs  Lab 09/06/19 0643 09/06/19 0643 09/07/19 0516 09/08/19 0500 09/09/19 0500  NA 135   < > 138 138 136  K 3.7   < > 3.6 3.5 3.3*  CL 102   < > 99 100 99  CO2 22   < > 23 23 23   GLUCOSE 117*   < > 58* 136* 173*  BUN 10   < > 5* 7* 8  CREATININE 3.82*   < > 2.67* 3.63* 3.10*  CALCIUM 7.1*   < > 7.2* 7.3* 7.0*  PHOS 3.4  --  2.5  --  1.3*   < > = values in this interval not displayed.   Liver Function Tests: Recent Labs  Lab 08/17/2019 1833 09/06/19 0643 09/07/19 0516 09/08/19 0500 09/09/19 0500  AST 41  --   --  125* 113*  ALT 22  --   --  39 39  ALKPHOS 52  --   --  85 98  BILITOT 1.4*  --   --  1.5* 1.3*  PROT 4.3*  --   --  3.8* 4.2*  ALBUMIN 2.2*   < > 1.8* 1.6* 1.5*   < > = values in this interval not displayed.   No results for input(s): LIPASE, AMYLASE in the last 168 hours. CBC: Recent Labs  Lab 09/03/19 0250 09/03/19 0250 09/03/19 0727 09/03/19 1100 08/20/2019 0725 08/12/2019 1542 09/07/19 0516 09/08/19 0500 09/09/19 0500  WBC 7.6   < > 7.6   < > 7.9  --  7.8  --  8.3  NEUTROABS  --   --    --   --   --   --   --   --  7.1  HGB 9.1*   < > 9.9*   < > 6.8*   < > 9.6* 9.5* 9.8*  HCT 27.8*   < > 30.9*   < > 21.3*   < > 28.4* 28.7* 30.3*  MCV 94.6  --  96.3  --  96.8  --  92.2  --  96.5  PLT 144*   < > 159   < > 129*  --  129*  --  98*   < > = values in this interval not displayed.   Blood Culture    Component Value Date/Time   SDES URINE, CLEAN CATCH 01/04/2018 1524   SPECREQUEST  01/04/2018 1524    NONE Performed at [facility] [address], [place], [place] [address]    CULT MULTIPLE SPECIES  PRESENT, SUGGEST RECOLLECTION (A) 01/04/2018 1524   REPTSTATUS 01/05/2018 FINAL 01/04/2018 1524    Cardiac Enzymes: No results for input(s): CKTOTAL, CKMB, CKMBINDEX, TROPONINI in the last 168 hours. CBG: Recent Labs  Lab 09/08/19 1643 09/08/19 2057 09/09/19 0036 09/09/19 0428 09/09/19 0812  GLUCAP 117* 150* 194* 173* 165*   Iron Studies: No results for input(s): IRON, TIBC, TRANSFERRIN, FERRITIN in the last 72 hours. @lablastinr3 @ Studies/Results: CT HEAD WO CONTRAST  Result Date: 09/07/2019 CLINICAL DATA:  Altered mental status (AMS), unclear cause. EXAM: CT HEAD WITHOUT CONTRAST TECHNIQUE: Contiguous axial images were obtained from the base of the skull through the vertex without intravenous contrast. COMPARISON:  Head CT 08/23/2019, brain MRI 06/07/2019 FINDINGS: Brain: There is no evidence of acute intracranial hemorrhage, intracranial mass, midline shift or extra-axial fluid collection.Redemonstrated chronic cortically based left parietal lobe infarct. Redemonstrated chronic lacunar infarct within the left basal ganglia and adjacent white matter. No new demarcated infarct is identified. Background mild ill-defined hypoattenuation within the cerebral white matter is nonspecific, but consistent with chronic small vessel ischemic disease. Stable, mild generalized parenchymal atrophy. Partially empty sella turcica. Vascular: No hyperdense vessel.  Atherosclerotic  calcifications Skull: Normal. Negative for fracture or focal lesion. Sinuses/Orbits: Frothy secretions and air-fluid level within the left maxillary sinus. Small right frontal sinus osteoma. Sequela of prior right mastoidectomy. The remaining right mastoid air cells and mastoidectomy cavity are well aerated. No significant left mastoid effusion. Visualized orbits demonstrate no acute abnormality. IMPRESSION: 1. No evidence of acute intracranial abnormality. 2. Redemonstrated chronic infarcts within the left parietal lobe, as well as left basal ganglia and adjacent white matter. 3. Stable mild background generalized parenchymal atrophy and chronic small vessel ischemic disease. 4. Left maxillary sinusitis. Electronically Signed   By: Kellie Simmering DO   On: 09/07/2019 10:14   Medications: . sodium chloride Stopped (09/07/19 1550)  . sodium chloride    . sodium chloride 600 mL/hr at 08/19/2019 1649  . dextrose 5 % and 0.45 % NaCl with KCl 40 mEq/L    . magnesium sulfate bolus IVPB    . potassium PHOSPHATE IVPB (in mmol)    . thiamine injection 500 mg (09/08/19 2206)  . TPN ADULT (ION) 40 mL/hr at 09/08/19 1708  . TPN ADULT (ION)     . acetaminophen  650 mg Oral Q6H  . calcitRIOL  1 mcg Oral Q M,W,F-HD  . Chlorhexidine Gluconate Cloth  6 each Topical Q0600  . darbepoetin (ARANESP) injection - DIALYSIS  100 mcg Intravenous Q Wed-HD  . docusate sodium  100 mg Oral BID  . feeding supplement  1 Container Oral TID BM  . feeding supplement (PRO-STAT SUGAR FREE 64)  30 mL Oral TID  . insulin aspart  0-6 Units Subcutaneous Q4H  . levothyroxine  50 mcg Oral QAC breakfast  . pantoprazole  40 mg Oral Q0600  . phytonadione  10 mg Oral Daily  . sodium chloride flush  10-40 mL Intracatheter Q12H     Dialysis Orders: [facility] MWF  3.5 hr EDW 85 400/A 1.5 3 K 2.25 Ca -Heparin 4000 units IV initial bolus with Heparin 1500 units IV  mid tmt  -Mircera 100 mcg IV q 2 wks -last had 60 on 3/10  -calcitriol 1.0 mcg PO  TIW  Binders: fosrenol 2 gm PO TID ac  Assessment/Plan: 1. ABLA 2/2 recurrent diverticular GI bleeding - s/p colonoscopy -->6 polyps removed/non bleeding rectal ulcer. Bleeding recurred and now s/p IR embolization.Bleeding scan 3/28  + active bleed  RUQ near hepatic flexure - same area of prior embolization.Ongoing bloody stools. S/p 6 units prbcs. GI/CCS following -->s/p right hemicolectomy 3/30 2. AMS- being worked up per Neurology. No acute findings head CT 4/1.  3. ESRD- HD MWF. No heparin on HD. Next HD 4/5. Added K+ bath  4. AVF issues -recent bleeding issues with access - no heparin HD. Arm remains swollen but AVF functional.  Fistulogram once stable. Per dialysis RN, no further bleeding issues noted.  5. Hypotension/volume- BP low/improved this am.  Using alb, prn to support BP. Weights variable.  Noted recent RR events d/t hypotension. 263mL bolus so far.  6. Anemia  Hgb stable ~9.7.  S/p 6 units prbcs -last 3/30. Continue Aranesp 174mcg q Wed. 7. Metabolic bone disease- Phos low without binders-now supplementing phos. Corr Ca ok.  On Calcitriol for Ca support.  8. Severe malnutrition- Alb < 2.  Continue pro-stat + nepro. On TPN 9. CAD - Coreg on hold d/t hypotension  10. DM - Insulin per primary  11. GOC - for d/c to SNF when medically stable -  Lynnda Child PA-C [facility] Pager [phone] 09/09/2019,8:38 AM

## 2019-09-09 NOTE — Progress Notes (Signed)
Patient's BP 70/40. Manual BP obtained by doppler, systolic BP 78.  Patient alert but not responding to questions. Rapid response and Bodenheimer, NP notified.  282ml bolus ordered.  BP after bolus 92/79.

## 2019-09-10 ENCOUNTER — Inpatient Hospital Stay (HOSPITAL_COMMUNITY): Payer: Medicare Other

## 2019-09-10 DIAGNOSIS — I6389 Other cerebral infarction: Secondary | ICD-10-CM

## 2019-09-10 DIAGNOSIS — I639 Cerebral infarction, unspecified: Secondary | ICD-10-CM

## 2019-09-10 LAB — ECHOCARDIOGRAM COMPLETE
Height: 58 in
Weight: 2892.44 oz

## 2019-09-10 LAB — LIPID PANEL
Cholesterol: 60 mg/dL (ref 0–200)
HDL: <10 mg/dL — ABNORMAL LOW (ref >40)
Triglycerides: 163 mg/dL — ABNORMAL HIGH (ref <150)
VLDL: 33 mg/dL (ref 0–40)

## 2019-09-10 LAB — GLUCOSE, CAPILLARY
Glucose-Capillary: 144 mg/dL — ABNORMAL HIGH (ref 70–99)
Glucose-Capillary: 167 mg/dL — ABNORMAL HIGH (ref 70–99)
Glucose-Capillary: 157 mg/dL — ABNORMAL HIGH (ref 70–99)
Glucose-Capillary: 142 mg/dL — ABNORMAL HIGH (ref 70–99)
Glucose-Capillary: 161 mg/dL — ABNORMAL HIGH (ref 70–99)

## 2019-09-10 LAB — RENAL FUNCTION PANEL
Albumin: 1.3 g/dL — ABNORMAL LOW (ref 3.5–5.0)
Anion gap: 11 (ref 5–15)
BUN: 12 mg/dL (ref 8–23)
CO2: 24 mmol/L (ref 22–32)
Calcium: 7.2 mg/dL — ABNORMAL LOW (ref 8.9–10.3)
Chloride: 99 mmol/L (ref 98–111)
Creatinine, Ser: 3.89 mg/dL — ABNORMAL HIGH (ref 0.44–1.00)
GFR calc Af Amer: 13 mL/min — ABNORMAL LOW (ref >60)
GFR calc non Af Amer: 11 mL/min — ABNORMAL LOW (ref >60)
Glucose, Bld: 159 mg/dL — ABNORMAL HIGH (ref 70–99)
Phosphorus: 1.8 mg/dL — ABNORMAL LOW (ref 2.5–4.6)
Potassium: 4.1 mmol/L (ref 3.5–5.1)
Sodium: 134 mmol/L — ABNORMAL LOW (ref 135–145)

## 2019-09-10 LAB — MAGNESIUM: Magnesium: 1.9 mg/dL (ref 1.7–2.4)

## 2019-09-10 LAB — PROTIME-INR
INR: 1.1 (ref 0.8–1.2)
Prothrombin Time: 14.5 seconds (ref 11.4–15.2)

## 2019-09-10 LAB — RPR: RPR Ser Ql: NONREACTIVE

## 2019-09-10 MED ORDER — TRAVASOL 10 % IV SOLN
INTRAVENOUS | Status: AC
  Filled 2019-09-10: qty 1000.8

## 2019-09-10 MED ORDER — DEXTROSE-NACL 5-0.45 % IV SOLN: INTRAVENOUS | Status: DC

## 2019-09-10 MED ORDER — DEXTROSE-NACL 5-0.45 % IV SOLN: INTRAVENOUS | Status: AC

## 2019-09-10 MED ORDER — LEVOTHYROXINE SODIUM 100 MCG/5ML IV SOLN
25.0000 ug | Freq: Every day | INTRAVENOUS | Status: DC
  Administered 2019-09-10 – 2019-09-12 (×2): 25 ug via INTRAVENOUS
  Filled 2019-09-10 (×4): qty 5

## 2019-09-10 NOTE — Procedures (Signed)
ELECTROENCEPHALOGRAM REPORT   Patient: Roberta Bryant       Room #: 9V87A EEG No. ID: [id number] Age: 69 y.o.        Sex: female Requesting Physician: Mikhail Report Date:  09/10/2019        Interpreting Physician: Alexis Goodell  History: [name] is an 69 y.o. female with altered mental status  Medications:  Insulin, Colace, Synthroid, Thiamine  Conditions of Recording:  This is a 21 channel routine scalp EEG performed with bipolar and monopolar montages arranged in accordance to the international 10/20 system of electrode placement. One channel was dedicated to EKG recording.  The patient is in the poorly responsive state.  Description:  The background activity is slow and poorly organized.  It consists of a low voltage polymorphic delta activity that is continuous and diffusely distributed.  Also noted are occasional intermittent periodic discharges of triphasic morphology  There is no activation of the background noted with stimulation.  No epileptiform activity is noted.   Hyperventilation and intermittent photic stimulation were not performed.  IMPRESSION: This is an abnormal electroencephalogram due general background slowing with occasional triphasic waves.  These findings are consistent with a toxic/metabolic encephalopathy.  No epileptiform activity is noted.    Alexis Goodell, MD Neurology [phone] 09/10/2019, 2:02 PM

## 2019-09-10 NOTE — Progress Notes (Addendum)
NEURO HOSPITALIST PROGRESS NOTE   Subjective: Patient awake, more alert than yesterday. Attempts to talk, speech is garbled but understandable. " I have to pee" " ow" " [place]". Patient had 2 episodes of hypotension responsive to fluid boluses since the consult yesterday. Appears mental status has remained unchanged.  Exam: Vitals:   09/09/19 1949 09/10/19 0557  BP: (!) 98/57 (!) 89/55  Pulse: 84 82  Resp: 20 20  Temp: 98.2 F (36.8 C) 98.2 F (36.8 C)  SpO2:      Physical Exam  Constitutional: Appears well-developed and well-nourished.  Psych: Affect appropriate to situation Eyes: Normal external eye and conjunctiva. HENT: Normocephalic, no lesions, without obvious abnormality. Oral mucosa dry  Musculoskeletal- Generalized pitting edema, right arm bruise Cardiovascular: Normal rate and regular rhythm.  Respiratory: Effort normal, non-labored breathing saturations WNL on RA GI: Soft.  No distension. There is no tenderness.  Skin: WDI  Neuro:  Mental Status: Alert, oriented to name, able to speak, but a little garbled. Able to follow some simple commands such as "raise your arm", "stick out tongue" and "give me a thumbs up". Cranial Nerves: Blinks inconsistently to threat, ptosis not present, EOMI. PERRL. Smile symmetric, facial light touch sensation normal bilaterally Motor: Weak hand grip on right. Right arm 2-3/5; able to raise forearm off bed, but not entire arm. RLE 3/5, LLE 3/5. In the context of difficulty with commands and compliance, LUE with no attempts at movement. Could not give a thumbs up, no try at hand grip.  Sensory:  Light touch intact throughout, bilaterally Cerebellar: Unable to perform Gait: Deferred    Medications:  Scheduled: . acetaminophen  650 mg Oral Q6H  . calcitRIOL  1 mcg Oral Q M,W,F-HD  . Chlorhexidine Gluconate Cloth  6 each Topical Q0600  . darbepoetin (ARANESP) injection - DIALYSIS  100 mcg Intravenous Q  Wed-HD  . docusate sodium  100 mg Oral BID  . feeding supplement  1 Container Oral TID BM  . feeding supplement (PRO-STAT SUGAR FREE 64)  30 mL Oral TID  . insulin aspart  0-6 Units Subcutaneous Q4H  . levothyroxine  25 mcg Intravenous Daily  . pantoprazole  40 mg Oral Q0600  . phytonadione  10 mg Oral Daily  . sodium chloride flush  10-40 mL Intracatheter Q12H   Continuous: . sodium chloride Stopped (09/07/19 1550)  . sodium chloride    . sodium chloride 600 mL/hr at 09/04/2019 1649  . dextrose 5 % and 0.45% NaCl    . dextrose 5 % and 0.45% NaCl    . thiamine injection 500 mg (09/10/19 0957)  . TPN ADULT (ION) 40 mL/hr at 09/09/19 1746  . TPN ADULT (ION)     BEM:LJQGBE chloride, sodium chloride, acetaminophen, ondansetron (ZOFRAN) IV, oxyCODONE, sodium chloride flush  Pertinent Labs/Diagnostics: RPR: pending Thiamine : Pending TSH: WNL ( 2.331) CRP: 25.3 ESR: WNL (8)  MR BRAIN WO CONTRAST  Result Date: 09/09/2019 CLINICAL DATA:  Altered mental status EXAM: MRI HEAD WITHOUT CONTRAST TECHNIQUE: Multiplanar, multiecho pulse sequences of the brain and surrounding structures were obtained without intravenous contrast. COMPARISON:  06/07/2019 FINDINGS: Motion artifact present.  SWI sequence is nondiagnostic. Brain: There is a small focus of reduced diffusion in the right centrum semiovale. There is no intracranial mass or significant mass effect. Patchy and confluent areas of T2 hyperintensity in the supratentorial white matter are  nonspecific but may reflect mild to moderate chronic microvascular ischemic changes. 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 There is no hydrocephalus or extra-axial fluid collection. Vascular: Major vessel flow voids at the skull base are preserved. Skull and upper cervical spine: Normal marrow signal is preserved. Sinuses/Orbits: Mild mucosal thickening with small left maxillary sinus air-fluid level. Orbits are  unremarkable. Other: Sella is unremarkable.  Mastoid air cells are clear. IMPRESSION: Motion degraded study. 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 Nonspecific small left maxillary sinus air-fluid level, which could reflect acute sinusitis in the appropriate clinical setting. Electronically Signed   By: Macy Mis M.D.   On: 09/09/2019 17:44   DG Abd Portable 1V  Result Date: 09/09/2019 CLINICAL DATA:  Ileus EXAM: PORTABLE ABDOMEN - 1 VIEW COMPARISON:  Plain film of the abdomen dated 07/16/2003. FINDINGS: Diffusely distended gas-filled loops of small bowel within the abdomen and pelvis, measuring up to approximately 5 cm diameter. Upper abdomen is excluded. Surgical staples overlie the RIGHT lower abdomen. Surgical clips are also seen in the RIGHT upper quadrant, consistent with cholecystectomy. IMPRESSION: Diffusely distended gas-filled loops of small bowel within the abdomen and pelvis, measuring up to 5 cm diameter, compatible with the given history of ileus. Electronically Signed   By: Franki Cabot M.D.   On: 09/09/2019 09:00   Assessment:  69 year old female with PMHx of DM2, ESRD on HD (MWF), MI, HTN, CVA (07/2016) admitted on 08/22/2019 for GI bleed and AMS.  1. On exam today the patient's mentation is somewhat improved and able to follow some commands. Overall cooperation is poor.   2. Afebrile, with infectious work-up thus far is negative.  3. Ammonia normal at 33. AST mildly elevated at 125. ALT 39. Vitamin B12 normal. Cr elevated at 3.63. Total Ca low at 7.3. Ionized Ca low at 1.01. Mg low at 1.6.  4. DDx includes multifactorial encephalopathy with metabolic component due to electrolyte abnormalities, possible thiamine deficiency and EtOH withdrawal although she has been here since 3/21, which would likely put her out of the time window for this. May have an underlying dementia. Also in question is whether GI bleeding  is a contributing factor.  5. Also possible would be subclinical seizures with postictal state. However, EEG performed today shows no epileptiform activity.  6. EEG: This is an abnormal electroencephalogram due general background slowing with occasional triphasic waves.  These findings are consistent with a toxic/metabolic encephalopathy.  No epileptiform activity is noted.   43. Has old strokes on CT. MRI revealed a small acute right centrum semiovale infarct. The infarct is too small to explain her presentation. However, will need stroke work up.      Recommendations: - Continue high-dose thiamine at 500 mg IV TID x 3 days , then 100 mg po qd thereafter - ECHO - PT/OT/Speech - Lipid panel/ [id number] - Telemetry - Carotid dopplers - MRA brain - High intensity statin if LDL > 70 - Will defer antiplatelet management given her recent GI bleed.    Laurey Morale, MSN, NP-C [facility] [phone]   Electronically signed: Dr. Kerney Elbe 09/10/2019, 10:10 AM

## 2019-09-10 NOTE — Progress Notes (Signed)
  Echocardiogram 2D Echocardiogram has been performed.  Roberta Bryant 09/10/2019, 3:33 PM

## 2019-09-10 NOTE — Progress Notes (Signed)
[facility] Progress Note   Subjective:  Seen in room, opens eyes to voice, but not verbal. Did not pass swallow study today. On TPN.   Objective Vitals:   09/09/19 0601 09/09/19 0800 09/09/19 1949 09/10/19 0557  BP: 105/70  (!) 98/57 (!) 89/55  Pulse: (!) 114 (!) 118 84 82  Resp:   20 20  Temp:   98.2 F (36.8 C) 98.2 F (36.8 C)  TempSrc:   Oral Oral  SpO2: 99% 98%    Weight:    82 kg  Height:       Physical Exam General: Chronically ill appearing female in NAD Heart: RRR 2/6 systolic  Lungs: CTAB Abdomen: soft non-tender, RUQ incision, clean, intact  Extremities: BLE trace-1+ pitting edema, pedal edema Dialysis Access: L AVF + bruit.  Additional Objective Labs: Basic Metabolic Panel: Recent Labs  Lab 09/07/19 0516 09/07/19 0516 09/08/19 0500 09/09/19 0500 09/10/19 0425  NA 138   < > 138 136 134*  K 3.6   < > 3.5 3.3* 4.1  CL 99   < > 100 99 99  CO2 23   < > 23 23 24   GLUCOSE 58*   < > 136* 173* 159*  BUN 5*   < > 7* 8 12  CREATININE 2.67*   < > 3.63* 3.10* 3.89*  CALCIUM 7.2*   < > 7.3* 7.0* 7.2*  PHOS 2.5  --   --  1.3* 1.8*   < > = values in this interval not displayed.   Liver Function Tests: Recent Labs  Lab 08/20/2019 1833 09/06/19 0643 09/08/19 0500 09/09/19 0500 09/10/19 0425  AST 41  --  125* 113*  --   ALT 22  --  39 39  --   ALKPHOS 52  --  85 98  --   BILITOT 1.4*  --  1.5* 1.3*  --   PROT 4.3*  --  3.8* 4.2*  --   ALBUMIN 2.2*   < > 1.6* 1.5* 1.3*   < > = values in this interval not displayed.   No results for input(s): LIPASE, AMYLASE in the last 168 hours. CBC: Recent Labs  Lab 08/19/2019 0725 08/08/2019 1542 09/07/19 0516 09/08/19 0500 09/09/19 0500  WBC 7.9  --  7.8  --  8.3  NEUTROABS  --   --   --   --  7.1  HGB 6.8*   < > 9.6* 9.5* 9.8*  HCT 21.3*   < > 28.4* 28.7* 30.3*  MCV 96.8  --  92.2  --  96.5  PLT 129*  --  129*  --  98*   < > = values in this interval not displayed.   Blood Culture     Component Value Date/Time   SDES URINE, CLEAN CATCH 01/04/2018 1524   SPECREQUEST  01/04/2018 1524    NONE Performed at [facility] [address] [place], [place] [address]    CULT MULTIPLE SPECIES PRESENT, SUGGEST RECOLLECTION (A) 01/04/2018 1524   REPTSTATUS 01/05/2018 FINAL 01/04/2018 1524    Cardiac Enzymes: No results for input(s): CKTOTAL, CKMB, CKMBINDEX, TROPONINI in the last 168 hours. CBG: Recent Labs  Lab 09/09/19 1158 09/09/19 2013 09/09/19 2346 09/10/19 0501 09/10/19 0747  GLUCAP 177* 162* 154* 144* 167*   Iron Studies: No results for input(s): IRON, TIBC, TRANSFERRIN, FERRITIN in the last 72 hours. @lablastinr3 @ Studies/Results: MR BRAIN WO CONTRAST  Result Date: 09/09/2019 CLINICAL DATA:  Altered mental status EXAM: MRI  HEAD WITHOUT CONTRAST TECHNIQUE: Multiplanar, multiecho pulse sequences of the brain and surrounding structures were obtained without intravenous contrast. COMPARISON:  06/07/2019 FINDINGS: Motion artifact present.  SWI sequence is nondiagnostic. Brain: There is a small focus of reduced diffusion in the right centrum semiovale. There is no intracranial mass or significant mass effect. Patchy and confluent areas of T2 hyperintensity in the supratentorial white matter are nonspecific but may reflect mild to moderate chronic microvascular ischemic changes. 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 There is no hydrocephalus or extra-axial fluid collection. Vascular: Major vessel flow voids at the skull base are preserved. Skull and upper cervical spine: Normal marrow signal is preserved. Sinuses/Orbits: Mild mucosal thickening with small left maxillary sinus air-fluid level. Orbits are unremarkable. Other: Sella is unremarkable.  Mastoid air cells are clear. IMPRESSION: Motion degraded study. 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 Chronic microvascular ischemic changes and chronic  infarcts. Nonspecific small left maxillary sinus air-fluid level, which could reflect acute sinusitis in the appropriate clinical setting. Electronically Signed   By: Macy Mis M.D.   On: 09/09/2019 17:44   DG Abd Portable 1V  Result Date: 09/09/2019 CLINICAL DATA:  Ileus EXAM: PORTABLE ABDOMEN - 1 VIEW COMPARISON:  Plain film of the abdomen dated 07/16/2003. FINDINGS: Diffusely distended gas-filled loops of small bowel within the abdomen and pelvis, measuring up to approximately 5 cm diameter. Upper abdomen is excluded. Surgical staples overlie the RIGHT lower abdomen. Surgical clips are also seen in the RIGHT upper quadrant, consistent with cholecystectomy. IMPRESSION: Diffusely distended gas-filled loops of small bowel within the abdomen and pelvis, measuring up to 5 cm diameter, compatible with the given history of ileus. Electronically Signed   By: Franki Cabot M.D.   On: 09/09/2019 09:00   Medications: . sodium chloride Stopped (09/07/19 1550)  . sodium chloride    . sodium chloride 600 mL/hr at 08/14/2019 1649  . dextrose 5 % and 0.45% NaCl    . dextrose 5 % and 0.45% NaCl    . thiamine injection 500 mg (09/10/19 0957)  . TPN ADULT (ION) 40 mL/hr at 09/09/19 1746  . TPN ADULT (ION)     . acetaminophen  650 mg Oral Q6H  . calcitRIOL  1 mcg Oral Q M,W,F-HD  . Chlorhexidine Gluconate Cloth  6 each Topical Q0600  . darbepoetin (ARANESP) injection - DIALYSIS  100 mcg Intravenous Q Wed-HD  . docusate sodium  100 mg Oral BID  . feeding supplement  1 Container Oral TID BM  . feeding supplement (PRO-STAT SUGAR FREE 64)  30 mL Oral TID  . insulin aspart  0-6 Units Subcutaneous Q4H  . levothyroxine  25 mcg Intravenous Daily  . pantoprazole  40 mg Oral Q0600  . phytonadione  10 mg Oral Daily  . sodium chloride flush  10-40 mL Intracatheter Q12H     Dialysis Orders: [facility] MWF  3.5 hr EDW 85 400/A 1.5 3 K 2.25 Ca -Heparin 4000 units IV initial bolus with Heparin 1500 units IV  mid tmt   -Mircera 100 mcg IV q 2 wks -last had 60 on 3/10  -calcitriol 1.0 mcg PO TIW  Binders: fosrenol 2 gm PO TID ac  Assessment/Plan: 1. ABLA 2/2 recurrent diverticular GI bleeding - s/p colonoscopy -->6 polyps removed/non bleeding rectal ulcer. Bleeding recurred and now s/p IR embolization.Bleeding scan 3/28  + active bleed RUQ near hepatic flexure - same area of prior embolization.Ongoing bloody stools. S/p 6  units prbcs. GI/CCS following -->s/p right hemicolectomy 3/30 2. AMS- w/u per primary/neurology consulted. No acute findings head CT 4/1.  3. ESRD- HD MWF. No heparin on HD. Next HD 4/5.  4. AVF issues -recent bleeding issues with access -  Arm remains swollen but AVF functional.  Consider fistulogram once stable. Per dialysis RN, no further bleeding issues noted this week.  5. Hypotension/volume- BP low. Weights variable. Need to attempt UF while on TPN. Albumin prn, lower dialysate temp.  6.  Anemia  Hgb stable ~9.7.  S/p 6 units prbcs -last 3/30. Continue Aranesp 113mcg q Wed. 7. Metabolic bone disease- Phos low without binders- supplementing phos.  Corr Ca ok.  On Calcitriol for Ca support.  8. Severe malnutrition- Alb < 2.  Continue pro-stat + nepro. On TPN 9. CAD - Coreg on hold d/t hypotension  10. DM - Insulin per primary  11. GOC - for d/c to SNF when medically stable -  Lynnda Child PA-C [facility] Pager [phone] 09/10/2019,10:23 AM

## 2019-09-10 NOTE — Evaluation (Signed)
Clinical/Bedside Swallow Evaluation Patient Details  Name: Roberta Bryant MRN: [id number] Date of Birth: May 13, 1951  Today's Date: 09/10/2019 Time: SLP Start Time (ACUTE ONLY): 8295 SLP Stop Time (ACUTE ONLY): 0945 SLP Time Calculation (min) (ACUTE ONLY): 17 min  Past Medical History:  Past Medical History:  Diagnosis Date  . Anemia   . Aortic stenosis   . CHF (congestive heart failure) ([place])   . Childhood asthma   . Chronic upper back pain   . Coronary artery disease    Stent to left main coronary artery second of August 2017  . Decreased hearing   . ESRD (end stage renal disease) on dialysis [facility])    "MWF; [facility] in [place]" (01/06/2018)  . Gait abnormality 08/28/2016  . GERD (gastroesophageal reflux disease)   . Headache    "a few times/week" (10/30/2014)  . Headache syndrome 01/08/2017  . Heart murmur   . History of blood transfusion 1996   "related to menses"  . Hyperlipidemia   . Hypertension   . Hypothyroidism   . IBS (irritable bowel syndrome)   . Myocardial infarction ([place]) 2004  . Osteoarthritis   . Pneumonia    "years ago"  . Rotator cuff arthropathy of right shoulder   . Spondylosis of cervical spine 06/16/2017  . Stroke ([place]) 07/2016   mini stroke , right side of body weak-   . Type II diabetes mellitus ([place])    Past Surgical History:  Past Surgical History:  Procedure Laterality Date  . A/V FISTULAGRAM Left 05/31/2018   Procedure: A/V FISTULAGRAM;  Surgeon: Serafina Mitchell, MD;  Location: [facility];  Service: Cardiovascular;  Laterality: Left;  . APPENDECTOMY    . AV FISTULA PLACEMENT Left 12/03/2006   "forearm"  . AV FISTULA REPAIR Left 11/25/2009   "forearm"  . BIOPSY  08/21/2019   Procedure: BIOPSY;  Surgeon: Thornton Park, MD;  Location: [facility] ENDOSCOPY;  Service: Gastroenterology;;  . Wilmon Pali RELEASE Right 02/11/2017   Procedure: RIGHT CARPAL TUNNEL RELEASE;  Surgeon: Daryll Brod, MD;  Location: [place];   Service: Orthopedics;  Laterality: Right;  . CHOLECYSTECTOMY OPEN  1970's  . COLONOSCOPY W/ POLYPECTOMY  07/30/2010   Minimal sigmoid diverticulosis. Small internal hemorrhoids. Otherwise normal colonoscopy to TI.   Marland Kitchen COLONOSCOPY WITH PROPOFOL N/A 08/12/2019   Procedure: COLONOSCOPY WITH PROPOFOL;  Surgeon: Irene Shipper, MD;  Location: [facility] ENDOSCOPY;  Service: Endoscopy;  Laterality: N/A;  . CORONARY ANGIOPLASTY    . CORONARY ANGIOPLASTY WITH STENT PLACEMENT    . ESOPHAGOGASTRODUODENOSCOPY  10/21/2012   Gastric polyps- status post polypectomy. Mild gastritis.   Marland Kitchen ESOPHAGOGASTRODUODENOSCOPY (EGD) WITH PROPOFOL N/A 09/03/2019   Procedure: ESOPHAGOGASTRODUODENOSCOPY (EGD) WITH PROPOFOL;  Surgeon: Thornton Park, MD;  Location: [place];  Service: Gastroenterology;  Laterality: N/A;  . INSERTION OF DIALYSIS CATHETER Right 2010   "chest"  . IR ANGIOGRAM SELECTIVE EACH ADDITIONAL VESSEL  09/01/2019  . IR ANGIOGRAM SELECTIVE EACH ADDITIONAL VESSEL  09/01/2019  . IR ANGIOGRAM SELECTIVE EACH ADDITIONAL VESSEL  09/01/2019  . IR ANGIOGRAM VISCERAL SELECTIVE  09/01/2019  . IR ANGIOGRAM VISCERAL SELECTIVE  09/01/2019  . IR EMBO ART  VEN HEMORR LYMPH EXTRAV  INC GUIDE ROADMAPPING  09/01/2019  . IR US GUIDE VASC ACCESS RIGHT  09/01/2019  . PARATHYROIDECTOMY  2010?   Parathyroid autotransplantation   . PARTIAL COLECTOMY N/A 08/12/2019   Procedure: PARTIAL COLECTOMY;  Surgeon: Coralie Keens, MD;  Location: [place];  Service: General;  Laterality: N/A;  .  POLYPECTOMY  08/13/2019   Procedure: POLYPECTOMY;  Surgeon: Irene Shipper, MD;  Location: [place];  Service: Endoscopy;;  . REVERSE SHOULDER ARTHROPLASTY Right 01/06/2018  . REVERSE SHOULDER ARTHROPLASTY Right 01/06/2018   Procedure: RIGHT REVERSE SHOULDER ARTHROPLASTY;  Surgeon: [name], MD;  Location: [place];  Service: Orthopedics;  Laterality: Right;  . REVISON OF ARTERIOVENOUS FISTULA Left 12/04/2013   Procedure: REVISON OF ARTERIOVENOUS  FISTULA;  Surgeon: Rosetta Posner, MD;  Location: [place];  Service: Vascular;  Laterality: Left;  . REVISON OF ARTERIOVENOUS FISTULA Left 02/07/1193   Procedure: PLICATION OF LEFT BRACHIOCEPHALIC  ARTERIOVENOUS FISTULA;  Surgeon: Conrad Elmwood Park, MD;  Location: [place];  Service: Vascular;  Laterality: Left;  . SHOULDER ARTHROSCOPY WITH ROTATOR CUFF REPAIR AND SUBACROMIAL DECOMPRESSION Right 06/22/2017   Procedure: RIGHT SHOULDER ARTHROSCOPY WITH EXTENSIVE DEBRIDEMENT;  Surgeon: Mcarthur Rossetti, MD;  Location: [place];  Service: Orthopedics;  Laterality: Right;  . TRIGGER FINGER RELEASE Right 10/22/2017   Procedure: RELEASE RIGHT INDEX TRIGGER FINGER/A-1 PULLEY;  Surgeon: Daryll Brod, MD;  Location: [place];  Service: Orthopedics;  Laterality: Right;  . TUBAL LIGATION  1989  . UMBILICAL HERNIA REPAIR  28's X 2   HPI:  Pt is a 69 y.o. 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 Pt s/p EGD and colonoscopy: nonbleeding erosive gastropathy, peptic duodenitis, removal of 6 polyps. Pt continued to have rectal bleeding and is s/p hemicolectomy. Per surgery, pt is may start clear liquids if clinically indicated based on swallow function. 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   Assessment / Plan / Recommendation Clinical Impression  Pt was seen for bedside swallow evaluation. She reported that she has difficulty swallowing at baseline but was unable to provide any additional information regarding the nature of this difficulty. A complete oral mechanism exam could not be completed due to pt's difficulty following commands. However, she demonstrated reduced lingual ROM and a hoarse vocal quality with reduced vocal intensity. Dentition was  adequate. Trials were limited to ice chips and water due to recommendation of clear liquids. She demonstrated reduced awareness of boluses with limited manipulation of ice chips, and she inconsistently required cues for bolus manipulation and deglutition. Right-sided anterior spillage was noted with thin liquids via tsp and straw.  Pt demonstrated a single cough with 1/7 boluses of thin liquids via straw, possibly due to aspiration before deglutition. It is recommended that pt's NPO status be continued and SLP will follow to assess improvement in swallow function with further improvement in mentation.  SLP Visit Diagnosis: Dysphagia, oropharyngeal phase (R13.12)    Aspiration Risk  Moderate aspiration risk    Diet Recommendation NPO;Alternative means - temporary   Medication Administration: Via alternative means    Other  Recommendations Oral Care Recommendations: Oral care QID;Staff/trained caregiver to provide oral care   Follow up Recommendations Other (comment)(TBD)      Frequency and Duration min 2x/week  2 weeks       Prognosis Prognosis for Safe Diet Advancement: Good Barriers to Reach Goals: Severity of deficits;Cognitive deficits      Swallow Study   General Date of Onset: 09/09/19 HPI: Pt is a 69 y.o. female with end-stage renal disease on dialysis, CHF, hypertension, hyperlipidemia, hypothyroidism, diabetes, PD, several hospitalizations in February and  March 2021 for COVID-19 and related complications who presented to the ED with GI bleed and acute on chronic posthemorrhagic anemia. Pt s/p EGD and colonoscopy: nonbleeding erosive gastropathy, peptic duodenitis, removal of 6 polyps. Pt continued to have rectal bleeding and is s/p hemicolectomy. Per surgery, pt is okay to start clear liquids if clinically indicated based on swallow function. 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 Type of Study: Bedside Swallow Evaluation Previous Swallow  Assessment: None Diet Prior to this Study: NPO;TNA Respiratory Status: Room air History of Recent Intubation: No Behavior/Cognition: Alert;Cooperative;Confused;Doesn't follow directions;Requires cueing Oral Cavity Assessment: Dry Oral Care Completed by SLP: No Oral Cavity - Dentition: Adequate natural dentition Vision: Functional for self-feeding Self-Feeding Abilities: Total assist Patient Positioning: Upright in bed;Postural control adequate for testing Baseline Vocal Quality: Breathy;Hoarse;Low vocal intensity Volitional Cough: Weak Volitional Swallow: Unable to elicit    Oral/Motor/Sensory Function Overall Oral Motor/Sensory Function: Other (comment)(Pt unable to adequately follow the commands necessary)   Ice Chips Ice chips: Impaired Presentation: Spoon Oral Phase Impairments: Poor awareness of bolus;Reduced lingual movement/coordination Oral Phase Functional Implications: Prolonged oral transit   Thin Liquid Thin Liquid: Impaired Presentation: Straw;Spoon Oral Phase Impairments: Poor awareness of bolus;Reduced lingual movement/coordination Oral Phase Functional Implications: Right anterior spillage Pharyngeal  Phase Impairments: Cough - Immediate(once with thin via tsp but not with straw)    Nectar Thick Nectar Thick Liquid: Not tested   Honey Thick Honey Thick Liquid: Not tested   Puree Puree: Not tested   Solid     Solid: Not tested     Nikolis Berent I. Hardin Negus, [place], [place] Office number [phone] Pager [phone]  Horton Marshall 09/10/2019,10:05 AM

## 2019-09-10 NOTE — Progress Notes (Signed)
[facility] Progress Note  5 Days Post-Op  Subjective: CC-  Comfortable this morning. Opens eyes and does nod appropriately to yes/no questions today. Nods yes to abdominal pain and nausea. No emesis, flatus, or BM. Xray yesterday confirmed ileus.  MRI head showed small acute infarction of the right centrum semiovale.  Objective: Vital signs in last 24 hours: Temp:  [98.2 F (36.8 C)] 98.2 F (36.8 C) (04/04 0557) Pulse Rate:  [82-84] 82 (04/04 0557) Resp:  [20] 20 (04/04 0557) BP: (89-98)/(55-57) 89/55 (04/04 0557) Weight:  [82 kg] 82 kg (04/04 0557) Last BM Date: 09/06/19  Intake/Output from previous day: 04/03 0701 - 04/04 0700 In: 2572.1 [I.V.:2468.7; IV Piggyback:103.4] Out: -  Intake/Output this shift: No intake/output data recorded.  PE: Gen: Alert, NAD Pulm: Normal rate and effort YKD:XIPJA, soft, ND,mild diffuse tenderness without rebound or guarding, hypoactive bowel sounds. Open midline incision cdi with staples intact and no erythema or drainage Skin: no rashes noted, warm and dry   Lab Results:  Recent Labs    09/08/19 0500 09/09/19 0500  WBC  --  8.3  HGB 9.5* 9.8*  HCT 28.7* 30.3*  PLT  --  98*   BMET Recent Labs    09/08/19 0500 09/09/19 0500  NA 138 136  K 3.5 3.3*  CL 100 99  CO2 23 23  GLUCOSE 136* 173*  BUN 7* 8  CREATININE 3.63* 3.10*  CALCIUM 7.3* 7.0*   PT/INR Recent Labs    09/10/19 0425  LABPROT 14.5  INR 1.1   CMP     Component Value Date/Time   NA 136 09/09/2019 0500   K 3.3 (L) 09/09/2019 0500   CL 99 09/09/2019 0500   CO2 23 09/09/2019 0500   GLUCOSE 173 (H) 09/09/2019 0500   BUN 8 09/09/2019 0500   CREATININE 3.10 (H) 09/09/2019 0500   CALCIUM 7.0 (L) 09/09/2019 0500   CALCIUM 7.4 (L) 09/12/2008 0615   PROT 4.2 (L) 09/09/2019 0500   ALBUMIN 1.5 (L) 09/09/2019 0500   AST 113 (H) 09/09/2019 0500   ALT 39 09/09/2019 0500   ALKPHOS 98 09/09/2019 0500   BILITOT 1.3 (H) 09/09/2019 0500    GFRNONAA 15 (L) 09/09/2019 0500   GFRAA 17 (L) 09/09/2019 0500   Lipase     Component Value Date/Time   LIPASE 43 07/19/2018 2330       Studies/Results: MR BRAIN WO CONTRAST  Result Date: 09/09/2019 CLINICAL DATA:  Altered mental status EXAM: MRI HEAD WITHOUT CONTRAST TECHNIQUE: Multiplanar, multiecho pulse sequences of the brain and surrounding structures were obtained without intravenous contrast. COMPARISON:  06/07/2019 FINDINGS: Motion artifact present.  SWI sequence is nondiagnostic. Brain: There is a small focus of reduced diffusion in the right centrum semiovale. There is no intracranial mass or significant mass effect. Patchy and confluent areas of T2 hyperintensity in the supratentorial white matter are nonspecific but may reflect mild to moderate chronic microvascular ischemic changes. 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 There is no hydrocephalus or extra-axial fluid collection. Vascular: Major vessel flow voids at the skull base are preserved. Skull and upper cervical spine: Normal marrow signal is preserved. Sinuses/Orbits: Mild mucosal thickening with small left maxillary sinus air-fluid level. Orbits are unremarkable. Other: Sella is unremarkable.  Mastoid air cells are clear. IMPRESSION: Motion degraded study. 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 Nonspecific small left maxillary sinus air-fluid level, which could  reflect acute sinusitis in the appropriate clinical setting. Electronically Signed   By: Macy Mis M.D.   On: 09/09/2019 17:44   DG Abd Portable 1V  Result Date: 09/09/2019 CLINICAL DATA:  Ileus EXAM: PORTABLE ABDOMEN - 1 VIEW COMPARISON:  Plain film of the abdomen dated 07/16/2003. FINDINGS: Diffusely distended gas-filled loops of small bowel within the abdomen and pelvis, measuring up to approximately 5 cm diameter. Upper abdomen is  excluded. Surgical staples overlie the RIGHT lower abdomen. Surgical clips are also seen in the RIGHT upper quadrant, consistent with cholecystectomy. IMPRESSION: Diffusely distended gas-filled loops of small bowel within the abdomen and pelvis, measuring up to 5 cm diameter, compatible with the given history of ileus. Electronically Signed   By: Franki Cabot M.D.   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 Ninfa Linden - 08/11/2019 - POD #5 - Mobilize, PT. PT currently recommending SNF - Pulm toilet, IS - xray 4/3 confirmed ileus - Continue TPN. NPO until speech eval, if she passes ok to place back on clears.   FEN -NOP. TPN VTE -SCDs only, thrombocytopenia ID -Cefotetan peri-op Foley -None Follow up -Dr. Ninfa Linden   LOS: 14 days    Wellington Hampshire, [facility] 09/10/2019, 8:22 AM Please see Amion for pager number during day hours 7:00am-4:30pm

## 2019-09-10 NOTE — Progress Notes (Signed)
PHARMACY - TOTAL PARENTERAL NUTRITION CONSULT NOTE   Indication: Prolonged ileus  Patient Measurements: Height: 4' 10" (147.3 cm) Weight: 82 kg (180 lb 12.4 oz) IBW/kg (Calculated) : 40.9 TPN AdjBW (KG): 52.3 Body mass index is 37.78 kg/m. Usual Weight: 85kg  Assessment:  42 yof admitted with ABLA in the setting of recurrent diverticular  GIB. She required an EGD 3/22 (Nonbleeding erosive gastropathy, peptic duodenitis) and colonoscopy 3/23 (removal of 6 polyps from the right and left colon. Pathology showed tubular adenomas)  found to have a diverticular bleed. Bleeding recurred and now s/p IR embolization.Bleeding scan 3/28 + active bleed RUQ near hepatic flexure - same area of prior embolization.Ongoing bloody stools. S/p 6 units prbcs.Marland Kitchen Ultimately underwent a R hemicolectom 3/30. To start TPN post-op as bowel function has not returned due to prolonged ileus.   Glucose / Insulin: Synthroid (given some, held some). Hgb A1C 4.9.  CBGs 144-177. 4 units SSI. (GIF 1.24 TPN) Electrolytes: K 4.1 replaced (goal >/= 4), Ca 7 adjusts to 9.36 (albumin 1.3). Phos 1.3>1.8 very low. Mg 1.9 up. (Ca*Phos product only 15.1) -  Vit K 61m po daily, thiamine 5067mTID IV 4/2>4/5 Renal: ESRD on HD MWF. Conservative electrolytes. LFTs / TGs: Tbili 1.3, AST 113, ALT 39 WNL.  Prealbumin / albumin: Albumin 1.3, prealbumin 5.3 Intake / Output; MIVF: D51/2NS at 3549mr. No po intake noted. LBM 3/31. GI Imaging: CT abd 3/25 - Focus of active GI bleeding at hepatic flexure of colon, which demonstrates wall thickening which could be the result of infection, ischemia, or inflammatory bowel disease. Additional thickened small bowel loop in the LEFT mid abdomen question enteritis, associated with dilatation of small bowel loops proximal to this site suggesting a component of obstruction. Question gastric polyps versus redundant mucosa; recommend correlation with prior endoscopy. Small amount of nonspecific  perihepatic free fluid. Surgeries / Procedures:  3/22 EGD 3/23 Colonoscopy 3/30 Partial colectomy  Central access: CVC  TPN start date: 4/2  Nutritional Goals (per RD recommendation on 4/1): kCal: 1900-2100, Protein: 90-105 Goal TPN rate is 75 mL/hr (provides 100 g of protein and 2028 kcals per day)  Current Nutrition:  Clear liquids :  Boost, Prostat: none charted  Plan:  Increase TPN to goal 3m13m - TPN will provide 100g AA, 63g lipids (31% lipids), 293g dextrose (GIF 2.48), 2028 total kcal for 100% needs met - Add standard MVI MWF and trace elements to TPN - remove chromium due to ESRD on HD. (d/c Renal MV while on TPN) - Replace electrolytes cautiously in ESRD pt: K+, Phos, and Ca will increase with increased rate tonight. - Remove extra K+ from IVF: D51/2 NS at 35ml38m kvo with new TPN Continue SSI q4h and adjust as needed with increased rate. Monitor TPN labs on Mon/Thurs  Change po>>IV Synthroid since not consistently receiving it.   Roberta Bryant S. RoberAlford HighlandrmD, BCPS Clinical Staff Pharmacist [email or web address] RoberAlford Highlandstal [place] 09/10/2019,7:26 AM

## 2019-09-10 NOTE — Progress Notes (Signed)
EEG complete - results pending 

## 2019-09-10 NOTE — Progress Notes (Signed)
PROGRESS NOTE    Roberta Bryant  OYD:[id number] DOB: Nov 30, 1950 DOA: 08/14/2019 PCP: [name], MD   Brief Narrative:  HPI on 08/12/2019 by Ms. Roberta Gens, NP (ICU) 69 y/o F who presented to [facility] on 3/21 with reports of dark stools and altered mental status.    The patient was seen on 3/17 in the ER after she was noted to be altered staring off and not talking during HD. She reported feeling generalized weakness and missed HD on 3/15.  Only completed 50 minutes of HD on 3/17. Her Hgb at that time was 8.8. She reports she had bleeding from her fistula on Friday 3/19 that was brisk and difficult to control.   She returns to the ER on 3/21 with reports of black-red stools, weakness, abdominal pain.  EMS found her to have a BP of 90/50 and treated her with 68ml NS.  BP improved to 116/50.  She was awake/alert on arrival with SBP's remaining in the 90's.  Her last HD session was 3/19.  The patient's initial labs concerning for worsening anemia with Hgb of 5.4 (iSTAT).  She was treated with 2 units blood in the ER.  CXR was notable for bibasilar atelectasis and low lung volumes but no acute process.  She was FOBT positive on presentation with subsequent melanic stools in the ER.  GI, Nephrology consulted.    PCCM consulted for admission.    Interim history Patient admitted with GI bleed, status post EGD and colonoscopy found to have nonbleeding erosive gastropathy, peptic duodenitis, removal of 6 polyps from the right and left colon.  Pathology showed tubular adenomas.  She continued to have rectal bleeding, CTA 08/31/2019 with active bleeding in the hepatic flexure and had embolization done.  She continues to have intermittent rectal bleeding and requires blood transfusions.  Patient did have bleeding scan which is positive for bleeding at the hepatic flexure at the previous embolization site.  General surgery consulted after IR recommendation, status post right hemicolectomy. Continues to  have fluctuating mental status, neurology consulted. Found to have a small CVA on MRI.  Assessment & Plan   Recurrent diverticular GI bleeding/acute blood loss anemia/anemia of chronic kidney disease -Gastroenterology consult appreciated -Status post EGD 3/22: Nonbleeding erosive gastropathy, peptic duodenitis -Status post colonoscopy 3/23: 6 polyps removed from the right and left colon, left colon diverticulosis, no stigmata of bleeding.  Pathology shows tubular adenomas -Status post CTA which showed active bleeding at the hepatic flexure, underwent arteriogram and embolization on 08/31/2019 -Continued to have bleeding, NM bleeding scan obtained on 09/02/2019 which showed hepatic flexure bleeding again. 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 -Improved  End-stage renal disease -Patient dialyzes Monday, Wednesday, Friday -Nephrology consulted and appreciated  LUE aVF pulsatile with LUE larger than RUE ?CV Stenosis -Per nephrology, patient will need fistulogram once stable  Thrombocytopenia -Continue to monitor CBC  Elevated INR -Present on admission, has now improved, 1.1 today -Currently receiving vitamin K daily  Essential hypertension -BP has been soft, requiring patient to receive several IV fluid boluses as well as blood transfusion -Continue to monitor closely  Anxiety/depression -Continue Klonopin  Metabolic bone disease -Continue calcitriol  Diabetes mellitus, type II -Hemoglobin A1c  was 4.9 (08/09/2019)  -Januvia currently held -Patient has had intermittent hypoglycemia- started on D5 1/2 NS  -Placed on insulin sliding scale with CBG monitoring  Hypothyroidism -Continue Synthroid  Coronary artery disease -Patient follows with cardiologist at [facility] stress test was in 2019 showing an EF of 52%, low risk study -Currently no complaints of chest pain -Aspirin held due to GI bleed -Coreg held due to soft BP  Intermittent mild confusion/acute metabolic encephalopathy -Continues to have intermittent confusion -Question whether this is due to GI bleeding versus other cause -Continue to monitor closely -CT head: no evidence of acute intracranial abnormality. Chronic infarcts L parietal lobe, left basal ganglia, adjacent white matter  -Chest x-ray unremarkable for infection -ABG within normal limits, mild hypoxia -Ammonia level 33 -Discussed with son via phone, states patient has had intermittent confusion hallucinations prior to admission. Mental status worsened some after COVID infection in February 2021. Has been seeing Dr. Jannifer Franklin, neurology. Was diagnosed with Parkinsons, but states this was an incorrect diagnosis. Also states patient has had lower extremity weakness and gait abnormalities. 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 -Doubt this CVA explains patient's current symptoms of intermittent confusion -Neurology consulted and appreciated  -Hemoglobin A1c 4.9 on 08/23/2019 -Will obtain lipid panel, echocardiogram -Speech following -PT and OT of already assessed the patient and recommended SNF -Given GI bleed, holding off  on any type of antiplatelet at this time -Currently n.p.o., cannot give statin  Goals of care -Palliative care consulted and appreciated  Deconditioning -Continue PT/OT-recommended SNF  Poor oral intake -Has had inadequate oral intake, likely due to GI function -Will continue to monitor -Nutrition consulted, continue supplements -Speech therapy consulted -Started on TPN   Abdominal pain/Ileus -Abdominal x-ray on 09/09/2019 shows ileus -Continue conservative management, currently n.p.o.  DVT Prophylaxis  SCDs  Code Status: Full  Family Communication: None at bedside. Son via phone  Disposition Plan: Admitted from home for GI bleeding and anemia. S/p R hemicolectomy on 3/30. Pending further surgical recommendations and stabilization of hemoglobin, as well as mental status. Pending w/up.  Suspect SNF when stable.   Consultants Neurosurgery Nephrology Gastroenterology Interventional radiology PCCM Neurology  Procedures  08/25/2019 EGD: Afew gastric polyps biopsied: hyperplastic polyp with eroded granulation tissue and surface reactive change. Mild, nonbleeding, erosive gastropathy biopsied:Mild reactive gastropathy with some surface hyperplastic change, no H. Pylori.Duodenal nodule biopsied:peptic duodenitis.   08/29/2019 colonoscopy: 6 polyps removed located in right and left colon. Left colon diverticulosis. Solitary rectal ulcer, no stigmata of bleeding. Pathology shows tubular adenomas,noHGD. Ulcer not biopsied.   08/31/2019: IR arteriogram with bleeding in hepatic flexure, status post embolization  08/18/2019: Right hemicolectomy  Antibiotics   Anti-infectives (From admission, onward)   Start     Dose/Rate Route Frequency Ordered Stop   08/16/2019 1015  cefoTEtan (CEFOTAN) 2 g in sodium chloride 0.9 % 100 mL IVPB     2 g 200 mL/hr over 30 Minutes Intravenous  Once 08/16/2019 1002 08/11/2019 1600      Subjective:   Adelaida Reindel seen and examined today.   Patient able to nod and shake her head to simple  questions this morning.  Interactive.  Has some abdominal pain and nausea.  Denies vomiting.   Objective:   Vitals:   09/09/19 0601 09/09/19 0800 09/09/19 1949 09/10/19 0557  BP: 105/70  (!) 98/57 (!) 89/55  Pulse: (!) 114 (!) 118 84 82  Resp:   20 20  Temp:   98.2 F (36.8 C) 98.2 F (36.8 C)  TempSrc:   Oral Oral  SpO2: 99% 98%    Weight:    82 kg  Height:        Intake/Output Summary (Last 24 hours) at 09/10/2019 1021 Last data filed at 09/10/2019 0300 Gross per 24 hour  Intake 2572.05 ml  Output --  Net 2572.05 ml   Filed Weights   09/08/19 1400 09/09/19 0425 09/10/19 0557  Weight: 86.6 kg 87.8 kg 82 kg   Exam  General: Well developed, chronically ill-appearing, NAD  HEENT: NCAT, mucous membranes moist.   Cardiovascular: S1 S2 auscultated, RRR, SEM  Respiratory: Clear to auscultation bilaterally  Abdomen: Soft, obese, tender around incision site, nondistended, + bowel sounds, incision/staples clean and dry  Extremities: warm dry without cyanosis clubbing or edema  Neuro: Awake and alert, slow to respond.  Not speaking much.  Does follow commands  Data Reviewed: I have personally reviewed following labs and imaging studies  CBC: Recent Labs  Lab 08/15/2019 0725 08/18/2019 1542 09/06/19 1430 09/06/19 2000 09/07/19 0516 09/08/19 0500 09/09/19 0500  WBC 7.9  --   --   --  7.8  --  8.3  NEUTROABS  --   --   --   --   --   --  7.1  HGB 6.8*   < > 9.8* 9.7* 9.6* 9.5* 9.8*  HCT 21.3*   < > 28.7* 28.6* 28.4* 28.7* 30.3*  MCV 96.8  --   --   --  92.2  --  96.5  PLT 129*  --   --   --  129*  --  98*   < > = values in this interval not displayed.   Basic Metabolic Panel: Recent Labs  Lab 09/06/19 0643 09/07/19 0516 09/08/19 0500 09/09/19 0500 09/10/19 0425  NA 135 138 138 136 134*  K 3.7 3.6 3.5 3.3* 4.1  CL 102 99 100 99 99  CO2 22 23 23 23 24   GLUCOSE 117* 58* 136* 173* 159*  BUN 10 5* 7* 8 12  CREATININE  3.82* 2.67* 3.63* 3.10* 3.89*  CALCIUM 7.1* 7.2* 7.3* 7.0* 7.2*  MG  --   --   --  1.5* 1.9  PHOS 3.4 2.5  --  1.3* 1.8*   GFR: Estimated Creatinine Clearance: 12.3 mL/min (A) (by C-G formula based on SCr of 3.89 mg/dL (H)). Liver Function Tests: Recent Labs  Lab 08/28/2019 1833 08/09/2019 1833 09/06/19 0643 09/07/19 0516 09/08/19 0500 09/09/19 0500 09/10/19 0425  AST 41  --   --   --  125* 113*  --   ALT 22  --   --   --  39 39  --   ALKPHOS 52  --   --   --  85 98  --   BILITOT 1.4*  --   --   --  1.5* 1.3*  --   PROT 4.3*  --   --   --  3.8* 4.2*  --   ALBUMIN 2.2*   < > 2.0* 1.8* 1.6* 1.5* 1.3*   < > = values in this interval not displayed.  No results for input(s): LIPASE, AMYLASE in the last 168 hours. Recent Labs  Lab 09/07/19 1223  AMMONIA 33   Coagulation Profile: Recent Labs  Lab 09/04/19 0851 08/27/2019 1833 09/06/19 0632 09/07/19 0516 09/10/19 0425  INR 1.3* 1.6* 1.4* 1.5* 1.1   Cardiac Enzymes: No results for input(s): CKTOTAL, CKMB, CKMBINDEX, TROPONINI in the last 168 hours. BNP (last 3 results) No results for input(s): PROBNP in the last 8760 hours. HbA1C: No results for input(s): HGBA1C in the last 72 hours. CBG: Recent Labs  Lab 09/09/19 1158 09/09/19 2013 09/09/19 2346 09/10/19 0501 09/10/19 0747  GLUCAP 177* 162* 154* 144* 167*   Lipid Profile: Recent Labs    09/09/19 0500  TRIG 171*   Thyroid Function Tests: Recent Labs    09/09/19 1049  TSH 2.331   Anemia Panel: No results for input(s): VITAMINB12, FOLATE, FERRITIN, TIBC, IRON, RETICCTPCT in the last 72 hours. Urine analysis:    Component Value Date/Time   COLORURINE YELLOW 01/04/2018 1524   APPEARANCEUR CLEAR 01/04/2018 1524   LABSPEC 1.010 01/04/2018 1524   PHURINE 8.5 (H) 01/04/2018 1524   GLUCOSEU NEGATIVE 01/04/2018 1524   HGBUR NEGATIVE 01/04/2018 1524   [place] 01/04/2018 1524   KETONESUR NEGATIVE 01/04/2018 1524   PROTEINUR 100 (A) 01/04/2018 1524     UROBILINOGEN 0.2 10/30/2014 1801   NITRITE NEGATIVE 01/04/2018 1524   LEUKOCYTESUR SMALL (A) 01/04/2018 1524   Sepsis Labs: @LABRCNTIP (procalcitonin:4,lacticidven:4)  ) No results found for this or any previous visit (from the past 240 hour(s)).    Radiology Studies: MR BRAIN WO CONTRAST  Result Date: 09/09/2019 CLINICAL DATA:  Altered mental status EXAM: MRI HEAD WITHOUT CONTRAST TECHNIQUE: Multiplanar, multiecho pulse sequences of the brain and surrounding structures were obtained without intravenous contrast. COMPARISON:  06/07/2019 FINDINGS: Motion artifact present.  SWI sequence is nondiagnostic. Brain: There is a small focus of reduced diffusion in the right centrum semiovale. There is no intracranial mass or significant mass effect. Patchy and confluent areas of T2 hyperintensity in the supratentorial white matter are nonspecific but may reflect mild to moderate chronic microvascular ischemic changes. 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 There is no hydrocephalus or extra-axial fluid collection. Vascular: Major vessel flow voids at the skull base are preserved. Skull and upper cervical spine: Normal marrow signal is preserved. Sinuses/Orbits: Mild mucosal thickening with small left maxillary sinus air-fluid level. Orbits are unremarkable. Other: Sella is unremarkable.  Mastoid air cells are clear. IMPRESSION: Motion degraded study. 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 Nonspecific small left maxillary sinus air-fluid level, which could reflect acute sinusitis in the appropriate clinical setting. Electronically Signed   By: Macy Mis M.D.   On: 09/09/2019 17:44   DG Abd Portable 1V  Result Date: 09/09/2019 CLINICAL DATA:  Ileus EXAM: PORTABLE ABDOMEN - 1 VIEW COMPARISON:  Plain film of the abdomen dated 07/16/2003. FINDINGS: Diffusely distended gas-filled  loops of small bowel within the abdomen and pelvis, measuring up to approximately 5 cm diameter. Upper abdomen is excluded. Surgical staples overlie the RIGHT lower abdomen. Surgical clips are also seen in the RIGHT upper quadrant, consistent with cholecystectomy. IMPRESSION: Diffusely distended gas-filled loops of small bowel within the abdomen and pelvis, measuring up to 5 cm diameter, compatible with the given history of ileus. Electronically Signed   By: Franki Cabot M.D.   On: 09/09/2019 09:00     Scheduled Meds: .  acetaminophen  650 mg Oral Q6H  . calcitRIOL  1 mcg Oral Q M,W,F-HD  . Chlorhexidine Gluconate Cloth  6 each Topical Q0600  . darbepoetin (ARANESP) injection - DIALYSIS  100 mcg Intravenous Q Wed-HD  . docusate sodium  100 mg Oral BID  . feeding supplement  1 Container Oral TID BM  . feeding supplement (PRO-STAT SUGAR FREE 64)  30 mL Oral TID  . insulin aspart  0-6 Units Subcutaneous Q4H  . levothyroxine  25 mcg Intravenous Daily  . pantoprazole  40 mg Oral Q0600  . phytonadione  10 mg Oral Daily  . sodium chloride flush  10-40 mL Intracatheter Q12H   Continuous Infusions: . sodium chloride Stopped (09/07/19 1550)  . sodium chloride    . sodium chloride 600 mL/hr at 09/06/2019 1649  . dextrose 5 % and 0.45% NaCl    . dextrose 5 % and 0.45% NaCl    . thiamine injection 500 mg (09/10/19 0957)  . TPN ADULT (ION) 40 mL/hr at 09/09/19 1746  . TPN ADULT (ION)       LOS: 14 days   Time Spent in minutes   45 minutes  Izel Eisenhardt D.O. on 09/10/2019 at 10:21 AM  Between 7am to 7pm - Please see pager noted on [email or web address]  After 7pm go to [email or web address]  And look for the night coverage person covering for me after hours  [facility] Office  [phone]

## 2019-09-11 ENCOUNTER — Inpatient Hospital Stay (HOSPITAL_COMMUNITY): Payer: Medicare Other

## 2019-09-11 DIAGNOSIS — R410 Disorientation, unspecified: Secondary | ICD-10-CM

## 2019-09-11 LAB — COMPREHENSIVE METABOLIC PANEL
ALT: 29 U/L (ref 0–44)
AST: 61 U/L — ABNORMAL HIGH (ref 15–41)
Albumin: 1.2 g/dL — ABNORMAL LOW (ref 3.5–5.0)
Alkaline Phosphatase: 69 U/L (ref 38–126)
Anion gap: 11 (ref 5–15)
BUN: 22 mg/dL (ref 8–23)
CO2: 23 mmol/L (ref 22–32)
Calcium: 7.4 mg/dL — ABNORMAL LOW (ref 8.9–10.3)
Chloride: 99 mmol/L (ref 98–111)
Creatinine, Ser: 4.78 mg/dL — ABNORMAL HIGH (ref 0.44–1.00)
GFR calc Af Amer: 10 mL/min — ABNORMAL LOW (ref >60)
GFR calc non Af Amer: 9 mL/min — ABNORMAL LOW (ref >60)
Glucose, Bld: 194 mg/dL — ABNORMAL HIGH (ref 70–99)
Potassium: 4.9 mmol/L (ref 3.5–5.1)
Sodium: 133 mmol/L — ABNORMAL LOW (ref 135–145)
Total Bilirubin: 1.5 mg/dL — ABNORMAL HIGH (ref 0.3–1.2)
Total Protein: 4.3 g/dL — ABNORMAL LOW (ref 6.5–8.1)

## 2019-09-11 LAB — DIFFERENTIAL
Abs Immature Granulocytes: 0.08 10*3/uL — ABNORMAL HIGH (ref 0.00–0.07)
Basophils Absolute: 0 10*3/uL (ref 0.0–0.1)
Basophils Relative: 0 %
Eosinophils Absolute: 0.2 10*3/uL (ref 0.0–0.5)
Eosinophils Relative: 3 %
Immature Granulocytes: 1 %
Lymphocytes Relative: 10 %
Lymphs Abs: 0.6 10*3/uL — ABNORMAL LOW (ref 0.7–4.0)
Monocytes Absolute: 0.3 10*3/uL (ref 0.1–1.0)
Monocytes Relative: 5 %
Neutro Abs: 5.1 10*3/uL (ref 1.7–7.7)
Neutrophils Relative %: 81 %

## 2019-09-11 LAB — CBC
HCT: 29.7 % — ABNORMAL LOW (ref 36.0–46.0)
Hemoglobin: 9.6 g/dL — ABNORMAL LOW (ref 12.0–15.0)
MCH: 32.2 pg (ref 26.0–34.0)
MCHC: 32.3 g/dL (ref 30.0–36.0)
MCV: 99.7 fL (ref 80.0–100.0)
Platelets: 61 10*3/uL — ABNORMAL LOW (ref 150–400)
RBC: 2.98 MIL/uL — ABNORMAL LOW (ref 3.87–5.11)
RDW: 23.3 % — ABNORMAL HIGH (ref 11.5–15.5)
WBC: 6.3 10*3/uL (ref 4.0–10.5)
nRBC: 1.4 % — ABNORMAL HIGH (ref 0.0–0.2)

## 2019-09-11 LAB — TRIGLYCERIDES: Triglycerides: 162 mg/dL — ABNORMAL HIGH (ref <150)

## 2019-09-11 LAB — GLUCOSE, CAPILLARY
Glucose-Capillary: 158 mg/dL — ABNORMAL HIGH (ref 70–99)
Glucose-Capillary: 160 mg/dL — ABNORMAL HIGH (ref 70–99)
Glucose-Capillary: 172 mg/dL — ABNORMAL HIGH (ref 70–99)
Glucose-Capillary: 172 mg/dL — ABNORMAL HIGH (ref 70–99)
Glucose-Capillary: 177 mg/dL — ABNORMAL HIGH (ref 70–99)
Glucose-Capillary: 180 mg/dL — ABNORMAL HIGH (ref 70–99)
Glucose-Capillary: 189 mg/dL — ABNORMAL HIGH (ref 70–99)
Glucose-Capillary: 193 mg/dL — ABNORMAL HIGH (ref 70–99)

## 2019-09-11 LAB — MAGNESIUM: Magnesium: 2 mg/dL (ref 1.7–2.4)

## 2019-09-11 LAB — PHOSPHORUS: Phosphorus: 1.9 mg/dL — ABNORMAL LOW (ref 2.5–4.6)

## 2019-09-11 LAB — PREALBUMIN: Prealbumin: 5.7 mg/dL — ABNORMAL LOW (ref 18–38)

## 2019-09-11 MED ORDER — SODIUM CHLORIDE 0.9 % IV SOLN: 100.0000 mL | INTRAVENOUS | Status: DC | PRN

## 2019-09-11 MED ORDER — PENTAFLUOROPROP-TETRAFLUOROETH EX AERO: 1.0000 "application " | INHALATION_SPRAY | CUTANEOUS | Status: DC | PRN

## 2019-09-11 MED ORDER — LIDOCAINE HCL (PF) 1 % IJ SOLN: 5.0000 mL | INTRAMUSCULAR | Status: DC | PRN

## 2019-09-11 MED ORDER — ALBUMIN HUMAN 25 % IV SOLN
INTRAVENOUS | Status: AC
  Administered 2019-09-11: 25 g via INTRAVENOUS
  Filled 2019-09-11: qty 100

## 2019-09-11 MED ORDER — ALBUMIN HUMAN 25 % IV SOLN: 25.0000 g | Freq: Once | INTRAVENOUS | Status: AC

## 2019-09-11 MED ORDER — ALTEPLASE 2 MG IJ SOLR: 2.0000 mg | Freq: Once | INTRAMUSCULAR | Status: DC | PRN

## 2019-09-11 MED ORDER — TRAVASOL 10 % IV SOLN
INTRAVENOUS | Status: AC
  Filled 2019-09-11: qty 1000.8

## 2019-09-11 MED ORDER — LIDOCAINE-PRILOCAINE 2.5-2.5 % EX CREA: 1.0000 "application " | TOPICAL_CREAM | CUTANEOUS | Status: DC | PRN

## 2019-09-11 MED ORDER — HEPARIN SODIUM (PORCINE) 1000 UNIT/ML DIALYSIS
1000.0000 [IU] | INTRAMUSCULAR | Status: DC | PRN
  Filled 2019-09-11: qty 1

## 2019-09-11 NOTE — Progress Notes (Signed)
Patient ID: Roberta Bryant, female   DOB: [birth year], 69 y.o.   MRN: [id number]    6 Days Post-Op  Subjective: In HD.  Speech is slurred so she is difficult to understand.  C/o some abdominal pain but this is the same as since surgery.  ROS: See above, otherwise other systems negative  Objective: Vital signs in last 24 hours: Temp:  [98.2 F (36.8 C)-100.9 F (38.3 C)] 100.9 F (38.3 C) (04/05 0848) Pulse Rate:  [100-111] 100 (04/05 1030) Resp:  [20-30] 30 (04/05 0848) BP: (76-117)/(40-68) 86/44 (04/05 1030) SpO2:  [92 %-100 %] 98 % (04/05 0848) Weight:  [81.5 kg] 81.5 kg (04/05 0840) Last BM Date: 09/06/19  Intake/Output from previous day: 04/04 0701 - 04/05 0700 In: 571.2 [I.V.:521.2; IV Piggyback:50] Out: -  Intake/Output this shift: No intake/output data recorded.  PE: Abd: soft, appropriately tender, some BS, Nd, obese, midline incision is c/d/i with staples present  Lab Results:  Recent Labs    09/09/19 0500 09/11/19 0440  WBC 8.3 6.3  HGB 9.8* 9.6*  HCT 30.3* 29.7*  PLT 98* 61*   BMET Recent Labs    09/10/19 0425 09/11/19 0440  NA 134* 133*  K 4.1 4.9  CL 99 99  CO2 24 23  GLUCOSE 159* 194*  BUN 12 22  CREATININE 3.89* 4.78*  CALCIUM 7.2* 7.4*   PT/INR Recent Labs    09/10/19 0425  LABPROT 14.5  INR 1.1   CMP     Component Value Date/Time   NA 133 (L) 09/11/2019 0440   K 4.9 09/11/2019 0440   CL 99 09/11/2019 0440   CO2 23 09/11/2019 0440   GLUCOSE 194 (H) 09/11/2019 0440   BUN 22 09/11/2019 0440   CREATININE 4.78 (H) 09/11/2019 0440   CALCIUM 7.4 (L) 09/11/2019 0440   CALCIUM 7.4 (L) 09/12/2008 0615   PROT 4.3 (L) 09/11/2019 0440   ALBUMIN 1.2 (L) 09/11/2019 0440   AST 61 (H) 09/11/2019 0440   ALT 29 09/11/2019 0440   ALKPHOS 69 09/11/2019 0440   BILITOT 1.5 (H) 09/11/2019 0440   GFRNONAA 9 (L) 09/11/2019 0440   GFRAA 10 (L) 09/11/2019 0440   Lipase     Component Value Date/Time   LIPASE 43 07/19/2018 2330        Studies/Results: EEG  Result Date: 09/10/2019 Alexis Goodell, MD     09/10/2019  2:09 PM ELECTROENCEPHALOGRAM REPORT Patient: Roberta Bryant       Room #: 9G28Z EEG No. ID: [id number] Age: 69 y.o.        Sex: female Requesting Physician: Mikhail Report Date:  09/10/2019       Interpreting Physician: Alexis Goodell History: Roberta Bryant is an 69 y.o. female with altered mental status Medications: Insulin, Colace, Synthroid, Thiamine Conditions of Recording:  This is a 21 channel routine scalp EEG performed with bipolar and monopolar montages arranged in accordance to the international 10/20 system of electrode placement. One channel was dedicated to EKG recording. The patient is in the poorly responsive state. Description:  The background activity is slow and poorly organized.  It consists of a low voltage polymorphic delta activity that is continuous and diffusely distributed.  Also noted are occasional intermittent periodic discharges of triphasic morphology There is no activation of the background noted with stimulation. No epileptiform activity is noted.  Hyperventilation and intermittent photic stimulation were not performed. IMPRESSION: This is an abnormal electroencephalogram due general background slowing with occasional triphasic waves.  These  findings are consistent with a toxic/metabolic encephalopathy.  No epileptiform activity is noted.  Alexis Goodell, MD Neurology [phone] 09/10/2019, 2:02 PM   DG Abd 1 View  Result Date: 09/11/2019 CLINICAL DATA:  Gastrointestinal bleeding. EXAM: ABDOMEN - 1 VIEW COMPARISON:  September 09, 2019. FINDINGS: Stable dilated small bowel loops are noted concerning for distal small bowel obstruction or possibly ileus. Midline surgical staples are noted. Status post cholecystectomy. No definite colonic dilatation is noted. IMPRESSION: Stable dilated small bowel loops are noted concerning for distal small bowel obstruction or possibly ileus. Electronically Signed    By: Marijo Conception M.D.   On: 09/11/2019 10:51   MR BRAIN WO CONTRAST  Result Date: 09/09/2019 CLINICAL DATA:  Altered mental status EXAM: MRI HEAD WITHOUT CONTRAST TECHNIQUE: Multiplanar, multiecho pulse sequences of the brain and surrounding structures were obtained without intravenous contrast. COMPARISON:  06/07/2019 FINDINGS: Motion artifact present.  SWI sequence is nondiagnostic. Brain: There is a small focus of reduced diffusion in the right centrum semiovale. There is no intracranial mass or significant mass effect. Patchy and confluent areas of T2 hyperintensity in the supratentorial white matter are nonspecific but may reflect mild to moderate chronic microvascular ischemic changes. 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 There is no hydrocephalus or extra-axial fluid collection. Vascular: Major vessel flow voids at the skull base are preserved. Skull and upper cervical spine: Normal marrow signal is preserved. Sinuses/Orbits: Mild mucosal thickening with small left maxillary sinus air-fluid level. Orbits are unremarkable. Other: Sella is unremarkable.  Mastoid air cells are clear. IMPRESSION: Motion degraded study. 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 Nonspecific small left maxillary sinus air-fluid level, which could reflect acute sinusitis in the appropriate clinical setting. Electronically Signed   By: Macy Mis M.D.   On: 09/09/2019 17:44   DG CHEST PORT 1 VIEW  Addendum Date: 09/11/2019   ADDENDUM REPORT: 09/11/2019 11:08 ADDENDUM: Findings were discussed with Dr. Jonnie Finner who indicated that the patient is s/p recent right hemicolectomy for GI bleeding. Indeed, patient is status post right hemicolectomy on 08/22/2019. Although no gas was seen on the portable chest x-ray of 09/07/2019, the patient had a supine abdomen on 09/09/2019 that did demonstrate a  Rigler's sign, consistent with the presence of intraperitoneal free. Gas is not unexpected 7-10 days after surgery. Electronically Signed   By: Misty Stanley M.D.   On: 09/11/2019 11:08   Result Date: 09/11/2019 CLINICAL DATA:  GI bleed.  CHF. EXAM: PORTABLE CHEST 1 VIEW COMPARISON:  09/07/2019 FINDINGS: 1036 hours. Low lung volumes. Cardiopericardial silhouette is at upper limits of normal for size. No overt pulmonary edema with streaky opacity in the right mid lung and left base suggesting atelectasis. Right IJ central line tip overlies the mid SVC level. Lucency identified under both hemidiaphragms, consistent with intraperitoneal free air. Probable Rigler sign left abdomen, also suggesting intraperitoneal free air. Status post right shoulder replacement. Telemetry leads overlie the chest. IMPRESSION: 1. Intraperitoneal free air without reported history of recent surgery. Bowel perforation a major concern. 2. Low lung volumes with borderline cardiomegaly and streaky bilateral airspace opacities suggesting atelectasis. Electronically Signed: By: Misty Stanley M.D. On: 09/11/2019 10:56   ECHOCARDIOGRAM COMPLETE  Result Date: 09/10/2019    ECHOCARDIOGRAM REPORT   Patient Name:   Roberta Bryant Date of Exam: 09/10/2019 Medical Rec #:  [id number]          Height:  58.0 in Accession #:    [id number]         Weight:       180.8 lb Date of Birth:  1950-08-22          BSA:          1.745 m Patient Age:    78 years           BP:           89/59 mmHg Patient Gender: F                  HR:           105 bpm. Exam Location:  Inpatient Procedure: 2D Echo, Cardiac Doppler and Color Doppler Indications:    Stroke 434.91/I163.0  History:        Patient has prior history of Echocardiogram examinations, most                 recent 12/28/2017. Risk Factors:Hypertension and Diabetes. ESRD.  Sonographer:    Clayton Lefort RDCS (AE) Referring Phys: [id number] [facility]  Sonographer Comments: Technically difficult study due to  poor echo windows, suboptimal parasternal window, no subcostal window and patient is morbidly obese. Limited patient mobility. IMPRESSIONS  1. Left ventricular ejection fraction, by estimation, is 65 to 70%. The left ventricle has normal function. The left ventricle has no regional wall motion abnormalities. There is mild left ventricular hypertrophy. Left ventricular diastolic parameters are consistent with Grade I diastolic dysfunction (impaired relaxation).  2. Right ventricular systolic function is hyperdynamic. The right ventricular size is normal.  3. Left atrial size was mildly dilated.  4. The mitral valve is abnormal. Trivial mitral valve regurgitation. The mean mitral valve gradient is 4.0 mmHg with average heart rate of 105 bpm.  5. There appears to be mid-LV cavitary obliteration, incresing the doppler gradient across the LVOT, so I suspect the degree of AS is overestimated. That being said, there is probably moderate AS with a calcified valve of indetermine cusps. The aortic valve has an indeterminant number of cusps. Aortic valve regurgitation is mild. Moderate aortic valve stenosis. Aortic regurgitation PHT measures 366 msec. Aortic valve area, by VTI measures 0.95 cm. Aortic valve mean gradient measures 19.2 mmHg. Aortic  valve Vmax measures 3.06 m/s. FINDINGS  Left Ventricle: Left ventricular ejection fraction, by estimation, is 65 to 70%. The left ventricle has normal function. The left ventricle has no regional wall motion abnormalities. The left ventricular internal cavity size was normal in size. There is  mild left ventricular hypertrophy. Left ventricular diastolic parameters are consistent with Grade I diastolic dysfunction (impaired relaxation). Indeterminate filling pressures. Right Ventricle: The right ventricular size is normal. No increase in right ventricular wall thickness. Right ventricular systolic function is hyperdynamic. Left Atrium: Left atrial size was mildly dilated. Right  Atrium: Right atrial size was normal in size. Pericardium: There is no evidence of pericardial effusion. Mitral Valve: The mitral valve is abnormal. There is mild calcification of the mitral valve leaflet(s). Mild to moderate mitral annular calcification. Trivial mitral valve regurgitation. MV peak gradient, 9.5 mmHg. The mean mitral valve gradient is 4.0 mmHg with average heart rate of 105 bpm. Tricuspid Valve: The tricuspid valve is grossly normal. Tricuspid valve regurgitation is trivial. Aortic Valve: There appears to be mid-LV cavitary obliteration, incresing the doppler gradient across the LVOT, so I suspect the degree of AS is overestimated. That being said, there is probably moderate AS with a calcified valve of indetermine  cusps. The aortic valve has an indeterminant number of cusps. Aortic valve regurgitation is mild. Aortic regurgitation PHT measures 366 msec. Moderate aortic stenosis is present. Aortic valve mean gradient measures 19.2 mmHg. Aortic valve peak gradient measures  37.4 mmHg. Aortic valve area, by VTI measures 0.95 cm. Pulmonic Valve: The pulmonic valve was grossly normal. Pulmonic valve regurgitation is not visualized. Aorta: The aortic root and ascending aorta are structurally normal, with no evidence of dilitation. IAS/Shunts: No atrial level shunt detected by color flow Doppler.  LEFT VENTRICLE PLAX 2D LVOT diam:     2.20 cm  Diastology LV SV:         46       LV e' lateral: 8.81 cm/s LV SV Index:   27 LVOT Area:     3.80 cm  RIGHT VENTRICLE RV Basal diam:  2.70 cm RV S prime:     8.16 cm/s TAPSE (M-mode): 1.9 cm LEFT ATRIUM             Index       RIGHT ATRIUM          Index LA Vol (A2C):   59.8 ml 34.28 ml/m RA Area:     9.32 cm LA Vol (A4C):   47.3 ml 27.11 ml/m RA Volume:   16.50 ml 9.46 ml/m LA Biplane Vol: 53.7 ml 30.78 ml/m  AORTIC VALVE AV Area (Vmax):    1.68 cm AV Area (Vmean):   0.91 cm AV Area (VTI):     0.95 cm AV Vmax:           305.75 cm/s AV Vmean:           200.250 cm/s AV VTI:            0.487 m AV Peak Grad:      37.4 mmHg AV Mean Grad:      19.2 mmHg LVOT Vmax:         135.00 cm/s LVOT Vmean:        47.800 cm/s LVOT VTI:          0.122 m LVOT/AV VTI ratio: 0.25 AI PHT:            366 msec MITRAL VALVE            TRICUSPID VALVE MV Peak grad: 9.5 mmHg  TR Peak grad:   22.7 mmHg MV Mean grad: 4.0 mmHg  TR Vmax:        238.00 cm/s MV Vmax:      1.54 m/s MV Vmean:     82.6 cm/s SHUNTS                         Systemic VTI:  0.12 m                         Systemic Diam: 2.20 cm Lyman Bishop MD Electronically signed by Lyman Bishop MD Signature Date/Time: 09/10/2019/3:44:40 PM    Final     Anti-infectives: Anti-infectives (From admission, onward)   Start     Dose/Rate Route Frequency Ordered Stop   08/29/2019 1015  cefoTEtan (CEFOTAN) 2 g in sodium chloride 0.9 % 100 mL IVPB     2 g 200 mL/hr over 30 Minutes Intravenous  Once 08/27/2019 1002 08/30/2019 1600       Assessment/Plan ESRD - dialysis on MWF History of CHF HTN CAD History of stroke DM2 Deconditioning HxCovid about 8 weeks ago  Protein Calorie Malnutrition - Per-Alb5.8 (4/2),continueTPN AMS - workup per primary, neurology, 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 Ninfa Linden - 08/12/2019 - Mobilize, PT. PT currently recommending SNF - Pulm toilet, IS - xray 4/3 confirmed ileus - Continue TPN. NPO until speech eval, if she passes ok to place back on clears.   FEN -NPO/TPN VTE -SCDsonly, thrombocytopenia ID -Cefotetan peri-op Foley -None Follow up -Dr. Ninfa Linden   LOS: 15 days    Henreitta Cea , [facility] 09/11/2019, 11:37 AM Please see Amion for pager number during day hours 7:00am-4:30pm or 7:00am -11:30am on weekends

## 2019-09-11 NOTE — Progress Notes (Signed)
NEUROLOGY PROGRESS NOTE  Subjective: Patient currently in dialysis.  Initially moaning and oftentimes hard to understand.   Exam: Vitals:   09/10/19 2222 09/11/19 0353  BP:  97/61  Pulse: (!) 102 (!) 102  Resp:  20  Temp:  98.2 F (36.8 C)  SpO2:  100%   Physical Exam  Constitutional: Appears well-developed and well-nourished.  Eyes: No scleral injection HENT: No OP obstrucion Head: Normocephalic.  Cardiovascular: Palpable pulse Respiratory: Effort normal, non-labored breathing GI: Soft.  No distension. There is no tenderness.  Skin: WDI-large hematoma on the right bicep region Neuro:  Mental Status: Alert, oriented to the fact she is in dialysis and the year is 2021.  Other than that information patient is moaning and noncomprehensible.  Patient was able to smile when asked along with stick her tongue out and wiggling her toes, other than those 3 things I could not get her to follow commands, thought content appropriate.   Cranial Nerves: II: Links to threat bilaterally III,IV, VI: ptosis present in right eye, extra-ocular motions intact bilaterally pupils equal, round, reactive to light and accommodation V,VII: smile symmetric, facial light touch sensation normal bilaterally VIII: hearing normal bilaterally XII: midline tongue extension Motor: Able to rise bilateral arms off bed withdrew legs to noxious stimuli Sensory: Withdraws to noxious stimuli Deep Tendon Reflexes: 2+ bilateral upper extremities no knee jerk or ankle jerk Plantars: Right: downgoing   Left: downgoing     Medications:  Scheduled: . acetaminophen  650 mg Oral Q6H  . calcitRIOL  1 mcg Oral Q M,W,F-HD  . Chlorhexidine Gluconate Cloth  6 each Topical Q0600  . darbepoetin (ARANESP) injection - DIALYSIS  100 mcg Intravenous Q Wed-HD  . docusate sodium  100 mg Oral BID  . feeding supplement  1 Container Oral TID BM  . feeding supplement (PRO-STAT SUGAR FREE 64)  30 mL Oral TID  . insulin aspart  0-6  Units Subcutaneous Q4H  . levothyroxine  25 mcg Intravenous Daily  . pantoprazole  40 mg Oral Q0600  . sodium chloride flush  10-40 mL Intracatheter Q12H    Pertinent Labs/Diagnostics: Carotid Dopplers pending A1c 4.9 LDL unable to calculate MRA pending RPR nonreactive Been pending ESR 8 TSH 2.331    EEG  Result Date: 09/10/2019 . IMPRESSION: This is an abnormal electroencephalogram due general background slowing with occasional triphasic waves.  These findings are consistent with a toxic/metabolic encephalopathy.  No epileptiform activity is noted.  Alexis Goodell, MD Neurology [phone] 09/10/2019, 2:02 PM   MR BRAIN WO CONTRAST  Result Date: 09/09/2019 . IMPRESSION: Motion degraded study. 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 Nonspecific small left maxillary sinus air-fluid level, which could reflect acute sinusitis in the appropriate clinical setting. Electronically Signed   By: Macy Mis M.D.   On: 09/09/2019 17:44   ECHOCARDIOGRAM COMPLETE Ejection fraction of 65-70% with no PFO    Etta Quill PA-C [facility] [phone]  Assessment:  69 year old female presenting to hospital secondary to GI bleed and altered mental status.  On exam today patient is moaning and for the majority of the exam she is talking noncomprehensible.  Able to follow simple commands only.  Continues to be afebrile with infectious work-up thus far negative. -As noted prior ammonia normal at 33.  AST mildly elevated at 125.  ALT 39.  B12 normal.  ESR 8.  TSH 2.331.  RPR nonreactive. -At this time most likely diagnosis is multifactorial encephalopathy  including possible underlying dementia, slow clearance of medications given her renal function -EEG: Shows generalized background slowing with no epileptiform activity noted -MRI reveals a small acute right centrum semiovale infarct.  However this infarct to be  too small to explain presentation.  Thus far stroke work-up has been done as above with MRA of head pending and carotid Dopplers pending.   Recommendations: Continue with stroke work-up We will defer antiplatelet management to GI and primary team After high-dose thiamine 500 mg 3 times daily for 3 days has finished would recommend thiamine 100 mg daily   09/11/2019, 9:16 AM

## 2019-09-11 NOTE — Progress Notes (Signed)
PT Cancellation Note  Patient Details Name: Roberta Bryant MRN: [id number] DOB: [birth year]   Cancelled Treatment:    Reason Eval/Treat Not Completed: Fatigue/lethargy limiting ability to participate;Patient's level of consciousness. Pt minimally responsive to multimodal cues and commands today, the pt only opened eyes to initial statement, but was unable to maintain eyes opened or follow any commands at this time. Given current presentation it is not currently safe to attempt progression of mobility without significant assist as the pt is currently unable to participate in any communication or maintain alertness. PT will continue to follow and treat as time/schedule allows.   Karma Ganja, PT, DPT   Acute Rehabilitation Department Pager #: [phone]   Otho Bellows 09/11/2019, 3:02 PM

## 2019-09-11 NOTE — Progress Notes (Signed)
PROGRESS NOTE    Roberta Bryant  UGQ:[id number] DOB: 1950/08/30 DOA: 08/29/2019 PCP: [name], MD   Brief Narrative:  HPI on 08/11/2019 by [name], NP (ICU) 69 y/o F who presented to [facility] on 3/21 with reports of dark stools and altered mental status.    The patient was seen on 3/17 in the ER after she was noted to be altered staring off and not talking during HD. She reported feeling generalized weakness and missed HD on 3/15.  Only completed 50 minutes of HD on 3/17. Her Hgb at that time was 8.8. She reports she had bleeding from her fistula on Friday 3/19 that was brisk and difficult to control.   She returns to the ER on 3/21 with reports of black-red stools, weakness, abdominal pain.  EMS found her to have a BP of 90/50 and treated her with 659ml NS.  BP improved to 116/50.  She was awake/alert on arrival with SBP's remaining in the 90's.  Her last HD session was 3/19.  The patient's initial labs concerning for worsening anemia with Hgb of 5.4 (iSTAT).  She was treated with 2 units blood in the ER.  CXR was notable for bibasilar atelectasis and low lung volumes but no acute process.  She was FOBT positive on presentation with subsequent melanic stools in the ER.  GI, Nephrology consulted.    PCCM consulted for admission.    Interim history Patient admitted with GI bleed, status post EGD and colonoscopy found to have nonbleeding erosive gastropathy, peptic duodenitis, removal of 6 polyps from the right and left colon.  Pathology showed tubular adenomas.  She continued to have rectal bleeding, CTA 08/31/2019 with active bleeding in the hepatic flexure and had embolization done.  She continues to have intermittent rectal bleeding and requires blood transfusions.  Patient did have bleeding scan which is positive for bleeding at the hepatic flexure at the previous embolization site.  General surgery consulted after IR recommendation, status post right hemicolectomy. Continues to  have fluctuating mental status, neurology consulted. Found to have a small CVA on MRI.  Assessment & Plan   Recurrent diverticular GI bleeding/acute blood loss anemia/anemia of chronic kidney disease -Gastroenterology consult appreciated -Status post EGD 3/22: Nonbleeding erosive gastropathy, peptic duodenitis -Status post colonoscopy 3/23: 6 polyps removed from the right and left colon, left colon diverticulosis, no stigmata of bleeding.  Pathology shows tubular adenomas -Status post CTA which showed active bleeding at the hepatic flexure, underwent arteriogram and embolization on 08/31/2019 -Continued to have bleeding, NM bleeding scan obtained on 09/02/2019 which showed hepatic flexure bleeding again. 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 -Improved  End-stage renal disease -Patient dialyzes Monday, Wednesday, Friday -Nephrology consulted and appreciated -HD today  LUE aVF pulsatile with LUE larger than RUE ?CV Stenosis -Per nephrology, patient will need fistulogram once stable  Thrombocytopenia -unknown cause -continue to monitor CBC  Elevated INR -Present on admission, given vitamin K daily, now down to 1.1 -Continue to monitor  Essential hypertension -BP has been soft, requiring patient to receive several IV fluid boluses as well as blood  transfusion -Continue to monitor closely  Anxiety/depression -Was on Klonopin  Metabolic bone disease -Continue calcitriol  Diabetes mellitus, type  II -Hemoglobin A1c was 4.9 (08/15/2019)  -Januvia currently held -Hypoglycemia has resolved, will discontinue D5 half-normal saline -Placed on insulin sliding scale with CBG monitoring  Hypothyroidism -Continue Synthroid  Coronary artery disease -Patient follows with cardiologist at [facility] stress test was in 2019 showing an EF of 52%, low risk study -Currently no complaints of chest pain -Aspirin held due to GI bleed -Coreg held due to soft BP  Intermittent mild confusion/acute metabolic encephalopathy -Continues to have intermittent confusion -Question whether this is due to GI bleeding versus other cause -Continue to monitor closely -CT head: no evidence of acute intracranial abnormality. Chronic infarcts L parietal lobe, left basal ganglia, adjacent white matter  -Chest x-ray unremarkable for infection -ABG within normal limits, mild hypoxia -Ammonia level 33 -Discussed with son via phone, states patient has had intermittent confusion hallucinations prior to admission. Mental status worsened some after COVID infection in February 2021. Has been seeing Dr. Jannifer Franklin, neurology. Was diagnosed with Parkinsons, but states this was an incorrect diagnosis. Also states patient has had lower extremity weakness and gait abnormalities. -?underlying dementia -have reviewed outpatient neurology notes, no mention of mental/memory impairment or AMS -Neurology consulted and appreciated- started on high dose thiamine  -MRI as below, doubt this is the cause of patient's acute metabolic encephalopathy and intermittent confusion -EEG: Normal due to general background slowing with occasional triphasic waves.  Findings consistent with toxic/metabolic encephalopathy.  No epileptiform activity.  Acute CVA -MRI brain: Small acute infarction of the right centrum semiovale -Doubt this CVA explains patient's current symptoms of intermittent confusion -Neurology consulted and appreciated  -Hemoglobin A1c  4.9 on 08/22/2019 -Echocardiogram shows an EF of 65 to 18%, grade 1 diastolic dysfunction.  Trivial mitral valve regurg.  Mid LV cavitary obliteration, suspect degree of left ear likely moderate.  Mild aortic valve regurgitation. -Speech following -PT and OT of already assessed the patient and recommended SNF -Given GI bleed, holding off on any type of antiplatelet at this time -Lipid panel: Total cholesterol 60, HDL less than 10, LDL not calculated, triglycerides 163 -Hemoglobin A1c 4.9  Goals of care -Palliative care consulted and appreciated  Deconditioning -Continue PT/OT-recommended SNF  Poor oral intake -Has had inadequate oral intake, likely due to GI function -Will continue to monitor -Nutrition consulted, continue supplements -Speech therapy consulted -Continue TPN   Abdominal pain/Ileus -Abdominal x-ray on 09/09/2019 shows ileus -Continue conservative management, currently n.p.o.  Fever/tachycardia-SIRS -will obtain blood cultures and CXR -had fever yesterday afternoon, 100.64F  DVT Prophylaxis  SCDs  Code Status: Full  Family Communication: None at bedside. Son via phone  Disposition Plan: Admitted from home for GI bleeding and anemia. S/p R hemicolectomy on 3/30. Pending further surgical recommendations, Currently on TPN. Pending improvement in mental status. Suspect SNF when stable.   Consultants Neurosurgery Nephrology Gastroenterology Interventional radiology PCCM Neurology  Procedures  08/11/2019 EGD: Afew gastric polyps biopsied: hyperplastic polyp with eroded granulation tissue and surface reactive change. Mild, nonbleeding, erosive gastropathy biopsied:Mild reactive gastropathy with some surface hyperplastic change, no H. Pylori.Duodenal nodule biopsied:peptic duodenitis.   08/07/2019 colonoscopy: 6 polyps removed located in right and left colon. Left colon diverticulosis. Solitary rectal ulcer, no stigmata of bleeding. Pathology shows tubular  adenomas,noHGD. Ulcer not biopsied.   08/31/2019: IR arteriogram with bleeding in hepatic flexure, status post embolization  08/09/2019: Right hemicolectomy  Antibiotics   Anti-infectives (From admission, onward)  Start     Dose/Rate Route Frequency Ordered Stop   09/04/2019 1015  cefoTEtan (CEFOTAN) 2 g in sodium chloride 0.9 % 100 mL IVPB     2 g 200 mL/hr over 30 Minutes Intravenous  Once 08/31/2019 1002 08/31/2019 1600      Subjective:   Roberta Bryant seen and examined today.  Able to answer simple questions by shaking/noding her head. Denies current chest pain or shortness of breath. Somewhat interactive.  Objective:   Vitals:   09/10/19 1608 09/10/19 1942 09/10/19 2222 09/11/19 0353  BP: (!) 99/59 99/68  97/61  Pulse: (!) 104  (!) 102 (!) 102  Resp: 20 20  20   Temp: 100 F (37.8 C) 98.4 F (36.9 C)  98.2 F (36.8 C)  TempSrc: Axillary Oral  Oral  SpO2: 100%   100%  Weight:    81.5 kg  Height:        Intake/Output Summary (Last 24 hours) at 09/11/2019 0930 Last data filed at 09/10/2019 2230 Gross per 24 hour  Intake 571.18 ml  Output --  Net 571.18 ml   Filed Weights   09/09/19 0425 09/10/19 0557 09/11/19 0353  Weight: 87.8 kg 82 kg 81.5 kg   Exam  General: Well developed, chronically ill appearing, NAD  HEENT: NCAT, mucous membranes moist.  Cardiovascular: S1 S2 auscultated, RRR, SEM  Respiratory: Diminished breath sounds  Abdomen: Soft, nontender, nondistended, + bowel sounds  Extremities: warm dry without cyanosis clubbing or edema  Neuro: Awake and alert, slow to respond.   Data Reviewed: I have personally reviewed following labs and imaging studies  CBC: Recent Labs  Lab 08/27/2019 0725 08/18/2019 1542 09/06/19 2000 09/07/19 0516 09/08/19 0500 09/09/19 0500 09/11/19 0440  WBC 7.9  --   --  7.8  --  8.3 6.3  NEUTROABS  --   --   --   --   --  7.1 5.1  HGB 6.8*   < > 9.7* 9.6* 9.5* 9.8* 9.6*  HCT 21.3*   < > 28.6* 28.4* 28.7* 30.3* 29.7*   MCV 96.8  --   --  92.2  --  96.5 99.7  PLT 129*  --   --  129*  --  98* 61*   < > = values in this interval not displayed.   Basic Metabolic Panel: Recent Labs  Lab 09/06/19 0643 09/06/19 0643 09/07/19 0516 09/08/19 0500 09/09/19 0500 09/10/19 0425 09/11/19 0440  NA 135   < > 138 138 136 134* 133*  K 3.7   < > 3.6 3.5 3.3* 4.1 4.9  CL 102   < > 99 100 99 99 99  CO2 22   < > 23 23 23 24 23   GLUCOSE 117*   < > 58* 136* 173* 159* 194*  BUN 10   < > 5* 7* 8 12 22   CREATININE 3.82*   < > 2.67* 3.63* 3.10* 3.89* 4.78*  CALCIUM 7.1*   < > 7.2* 7.3* 7.0* 7.2* 7.4*  MG  --   --   --   --  1.5* 1.9 2.0  PHOS 3.4  --  2.5  --  1.3* 1.8* 1.9*   < > = values in this interval not displayed.   GFR: Estimated Creatinine Clearance: 10 mL/min (A) (by C-G formula based on SCr of 4.78 mg/dL (H)). Liver Function Tests: Recent Labs  Lab 08/23/2019 1833 09/06/19 0643 09/07/19 0516 09/08/19 0500 09/09/19 0500 09/10/19 0425 09/11/19 0440  AST 41  --   --  125* 113*  --  61*  ALT 22  --   --  39 39  --  29  ALKPHOS 52  --   --  85 98  --  69  BILITOT 1.4*  --   --  1.5* 1.3*  --  1.5*  PROT 4.3*  --   --  3.8* 4.2*  --  4.3*  ALBUMIN 2.2*   < > 1.8* 1.6* 1.5* 1.3* 1.2*   < > = values in this interval not displayed.   No results for input(s): LIPASE, AMYLASE in the last 168 hours. Recent Labs  Lab 09/07/19 1223  AMMONIA 33   Coagulation Profile: Recent Labs  Lab 08/14/2019 1833 09/06/19 0632 09/07/19 0516 09/10/19 0425  INR 1.6* 1.4* 1.5* 1.1   Cardiac Enzymes: No results for input(s): CKTOTAL, CKMB, CKMBINDEX, TROPONINI in the last 168 hours. BNP (last 3 results) No results for input(s): PROBNP in the last 8760 hours. HbA1C: No results for input(s): HGBA1C in the last 72 hours. CBG: Recent Labs  Lab 09/10/19 2024 09/11/19 0000 09/11/19 0312 09/11/19 0619 09/11/19 0747  GLUCAP 161* 160* 180* 172* 189*   Lipid Profile: Recent Labs    09/10/19 1059 09/11/19 0441   CHOL 60  --   HDL <10*  --   LDLCALC NOT CALCULATED  --   TRIG 163* 162*  CHOLHDL NOT CALCULATED  --    Thyroid Function Tests: Recent Labs    09/09/19 1049  TSH 2.331   Anemia Panel: No results for input(s): VITAMINB12, FOLATE, FERRITIN, TIBC, IRON, RETICCTPCT in the last 72 hours. Urine analysis:    Component Value Date/Time   COLORURINE YELLOW 01/04/2018 1524   APPEARANCEUR CLEAR 01/04/2018 1524   LABSPEC 1.010 01/04/2018 1524   PHURINE 8.5 (H) 01/04/2018 1524   GLUCOSEU NEGATIVE 01/04/2018 1524   HGBUR NEGATIVE 01/04/2018 1524   [place] 01/04/2018 1524   KETONESUR NEGATIVE 01/04/2018 1524   PROTEINUR 100 (A) 01/04/2018 1524   UROBILINOGEN 0.2 10/30/2014 1801   NITRITE NEGATIVE 01/04/2018 1524   LEUKOCYTESUR SMALL (A) 01/04/2018 1524   Sepsis Labs: @LABRCNTIP (procalcitonin:4,lacticidven:4)  ) No results found for this or any previous visit (from the past 240 hour(s)).    Radiology Studies: EEG  Result Date: 09/10/2019 Alexis Goodell, MD     09/10/2019  2:09 PM ELECTROENCEPHALOGRAM REPORT Patient: TELISA OHLSEN       Room #: 5Y09X EEG No. ID: [id number] Age: 69 y.o.        Sex: female Requesting Physician: Sofi Bryars Report Date:  09/10/2019       Interpreting Physician: Alexis Goodell History: REVA PINKLEY is an 69 y.o. female with altered mental status Medications: Insulin, Colace, Synthroid, Thiamine Conditions of Recording:  This is a 21 channel routine scalp EEG performed with bipolar and monopolar montages arranged in accordance to the international 10/20 system of electrode placement. One channel was dedicated to EKG recording. The patient is in the poorly responsive state. Description:  The background activity is slow and poorly organized.  It consists of a low voltage polymorphic delta activity that is continuous and diffusely distributed.  Also noted are occasional intermittent periodic discharges of triphasic morphology There is no activation of  the background noted with stimulation. No epileptiform activity is noted.  Hyperventilation and intermittent photic stimulation were not performed. IMPRESSION: This is an abnormal electroencephalogram due general background slowing with occasional triphasic waves.  These findings are consistent with a toxic/metabolic encephalopathy.  No epileptiform activity is  noted.  Alexis Goodell, MD Neurology [phone] 09/10/2019, 2:02 PM   MR BRAIN WO CONTRAST  Result Date: 09/09/2019 CLINICAL DATA:  Altered mental status EXAM: MRI HEAD WITHOUT CONTRAST TECHNIQUE: Multiplanar, multiecho pulse sequences of the brain and surrounding structures were obtained without intravenous contrast. COMPARISON:  06/07/2019 FINDINGS: Motion artifact present.  SWI sequence is nondiagnostic. Brain: There is a small focus of reduced diffusion in the right centrum semiovale. There is no intracranial mass or significant mass effect. Patchy and confluent areas of T2 hyperintensity in the supratentorial white matter are nonspecific but may reflect mild to moderate chronic microvascular ischemic changes. 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 There is no hydrocephalus or extra-axial fluid collection. Vascular: Major vessel flow voids at the skull base are preserved. Skull and upper cervical spine: Normal marrow signal is preserved. Sinuses/Orbits: Mild mucosal thickening with small left maxillary sinus air-fluid level. Orbits are unremarkable. Other: Sella is unremarkable.  Mastoid air cells are clear. IMPRESSION: Motion degraded study. 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 Nonspecific small left maxillary sinus air-fluid level, which could reflect acute sinusitis in the appropriate clinical setting. Electronically Signed   By: Macy Mis M.D.   On: 09/09/2019 17:44   ECHOCARDIOGRAM COMPLETE  Result Date:  09/10/2019    ECHOCARDIOGRAM REPORT   Patient Name:   KABELLA CASSIDY Date of Exam: 09/10/2019 Medical Rec #:  [id number]          Height:       58.0 in Accession #:    [id number]         Weight:       180.8 lb Date of Birth:  1951-06-06          BSA:          1.745 m Patient Age:    24 years           BP:           89/59 mmHg Patient Gender: F                  HR:           105 bpm. Exam Location:  Inpatient Procedure: 2D Echo, Cardiac Doppler and Color Doppler Indications:    Stroke 434.91/I163.0  History:        Patient has prior history of Echocardiogram examinations, most                 recent 12/28/2017. Risk Factors:Hypertension and Diabetes. ESRD.  Sonographer:    Clayton Lefort RDCS (AE) Referring Phys: [id number] [facility]  Sonographer Comments: Technically difficult study due to poor echo windows, suboptimal parasternal window, no subcostal window and patient is morbidly obese. Limited patient mobility. IMPRESSIONS  1. Left ventricular ejection fraction, by estimation, is 65 to 70%. The left ventricle has normal function. The left ventricle has no regional wall motion abnormalities. There is mild left ventricular hypertrophy. Left ventricular diastolic parameters are consistent with Grade I diastolic dysfunction (impaired relaxation).  2. Right ventricular systolic function is hyperdynamic. The right ventricular size is normal.  3. Left atrial size was mildly dilated.  4. The mitral valve is abnormal. Trivial mitral valve regurgitation. The mean mitral valve gradient is 4.0 mmHg with average heart rate of 105 bpm.  5. There appears to be mid-LV cavitary obliteration, incresing the doppler gradient across the LVOT, so I suspect the degree of AS  is overestimated. That being said, there is probably moderate AS with a calcified valve of indetermine cusps. The aortic valve has an indeterminant number of cusps. Aortic valve regurgitation is mild. Moderate aortic valve stenosis. Aortic regurgitation PHT measures  366 msec. Aortic valve area, by VTI measures 0.95 cm. Aortic valve mean gradient measures 19.2 mmHg. Aortic  valve Vmax measures 3.06 m/s. FINDINGS  Left Ventricle: Left ventricular ejection fraction, by estimation, is 65 to 70%. The left ventricle has normal function. The left ventricle has no regional wall motion abnormalities. The left ventricular internal cavity size was normal in size. There is  mild left ventricular hypertrophy. Left ventricular diastolic parameters are consistent with Grade I diastolic dysfunction (impaired relaxation). Indeterminate filling pressures. Right Ventricle: The right ventricular size is normal. No increase in right ventricular wall thickness. Right ventricular systolic function is hyperdynamic. Left Atrium: Left atrial size was mildly dilated. Right Atrium: Right atrial size was normal in size. Pericardium: There is no evidence of pericardial effusion. Mitral Valve: The mitral valve is abnormal. There is mild calcification of the mitral valve leaflet(s). Mild to moderate mitral annular calcification. Trivial mitral valve regurgitation. MV peak gradient, 9.5 mmHg. The mean mitral valve gradient is 4.0 mmHg with average heart rate of 105 bpm. Tricuspid Valve: The tricuspid valve is grossly normal. Tricuspid valve regurgitation is trivial. Aortic Valve: There appears to be mid-LV cavitary obliteration, incresing the doppler gradient across the LVOT, so I suspect the degree of AS is overestimated. That being said, there is probably moderate AS with a calcified valve of indetermine cusps. The aortic valve has an indeterminant number of cusps. Aortic valve regurgitation is mild. Aortic regurgitation PHT measures 366 msec. Moderate aortic stenosis is present. Aortic valve mean gradient measures 19.2 mmHg. Aortic valve peak gradient measures  37.4 mmHg. Aortic valve area, by VTI measures 0.95 cm. Pulmonic Valve: The pulmonic valve was grossly normal. Pulmonic valve regurgitation is not  visualized. Aorta: The aortic root and ascending aorta are structurally normal, with no evidence of dilitation. IAS/Shunts: No atrial level shunt detected by color flow Doppler.  LEFT VENTRICLE PLAX 2D LVOT diam:     2.20 cm  Diastology LV SV:         46       LV e' lateral: 8.81 cm/s LV SV Index:   27 LVOT Area:     3.80 cm  RIGHT VENTRICLE RV Basal diam:  2.70 cm RV S prime:     8.16 cm/s TAPSE (M-mode): 1.9 cm LEFT ATRIUM             Index       RIGHT ATRIUM          Index LA Vol (A2C):   59.8 ml 34.28 ml/m RA Area:     9.32 cm LA Vol (A4C):   47.3 ml 27.11 ml/m RA Volume:   16.50 ml 9.46 ml/m LA Biplane Vol: 53.7 ml 30.78 ml/m  AORTIC VALVE AV Area (Vmax):    1.68 cm AV Area (Vmean):   0.91 cm AV Area (VTI):     0.95 cm AV Vmax:           305.75 cm/s AV Vmean:          200.250 cm/s AV VTI:            0.487 m AV Peak Grad:      37.4 mmHg AV Mean Grad:      19.2 mmHg LVOT Vmax:  135.00 cm/s LVOT Vmean:        47.800 cm/s LVOT VTI:          0.122 m LVOT/AV VTI ratio: 0.25 AI PHT:            366 msec MITRAL VALVE            TRICUSPID VALVE MV Peak grad: 9.5 mmHg  TR Peak grad:   22.7 mmHg MV Mean grad: 4.0 mmHg  TR Vmax:        238.00 cm/s MV Vmax:      1.54 m/s MV Vmean:     82.6 cm/s SHUNTS                         Systemic VTI:  0.12 m                         Systemic Diam: 2.20 cm Lyman Bishop MD Electronically signed by Lyman Bishop MD Signature Date/Time: 09/10/2019/3:44:40 PM    Final      Scheduled Meds: . acetaminophen  650 mg Oral Q6H  . calcitRIOL  1 mcg Oral Q M,W,F-HD  . Chlorhexidine Gluconate Cloth  6 each Topical Q0600  . darbepoetin (ARANESP) injection - DIALYSIS  100 mcg Intravenous Q Wed-HD  . docusate sodium  100 mg Oral BID  . feeding supplement  1 Container Oral TID BM  . feeding supplement (PRO-STAT SUGAR FREE 64)  30 mL Oral TID  . insulin aspart  0-6 Units Subcutaneous Q4H  . levothyroxine  25 mcg Intravenous Daily  . pantoprazole  40 mg Oral Q0600  . sodium  chloride flush  10-40 mL Intracatheter Q12H   Continuous Infusions: . sodium chloride Stopped (09/07/19 1550)  . sodium chloride    . sodium chloride 600 mL/hr at 08/18/2019 1649  . [START ON 09/12/2019] sodium chloride    . [START ON 09/12/2019] sodium chloride    . dextrose 5 % and 0.45% NaCl 10 mL/hr at 09/10/19 1827  . thiamine injection 500 mg (09/10/19 2245)  . TPN ADULT (ION) 75 mL/hr at 09/10/19 1749  . TPN ADULT (ION)       LOS: 15 days   Time Spent in minutes   45 minutes  Marleta Lapierre D.O. on 09/11/2019 at 9:30 AM  Between 7am to 7pm - Please see pager noted on [email or web address]  After 7pm go to [email or web address]  And look for the night coverage person covering for me after hours  [facility] Office  [phone]

## 2019-09-11 NOTE — Progress Notes (Signed)
VASCULAR LAB PRELIMINARY  PRELIMINARY  PRELIMINARY  PRELIMINARY  Left Carotid duplex completed.    Preliminary report:  See CV proc for preliminary results.   Yarisbel Miranda, RVT 09/11/2019, 7:04 PM

## 2019-09-11 NOTE — Progress Notes (Addendum)
[facility] Progress Note   Subjective: Seen on HD. She is responding to verbal by screaming. Says back hurts. Oriented to person only. Conversation progressively improved. Now answering simple questions appropriately.   Objective Vitals:   09/10/19 1608 09/10/19 1942 09/10/19 2222 09/11/19 0353  BP: (!) 99/59 99/68  97/61  Pulse: (!) 104  (!) 102 (!) 102  Resp: 20 20  20   Temp: 100 F (37.8 C) 98.4 F (36.9 C)  98.2 F (36.8 C)  TempSrc: Axillary Oral  Oral  SpO2: 100%   100%  Weight:    81.5 kg  Height:       Physical Exam General: Chronically ill appearing female in NAD Heart: N0,I3 2/6 systolic M HR 704 ST on monitor Lungs: CTAB Abdomen: active BS ND, NT Extremities: BLE trace-1+ pitting edema, pedal edema Dialysis Access: L AVF + bruit. R arm with large area of ecchymosis.     Additional Objective Labs: Basic Metabolic Panel: Recent Labs  Lab 09/09/19 0500 09/10/19 0425 09/11/19 0440  NA 136 134* 133*  K 3.3* 4.1 4.9  CL 99 99 99  CO2 23 24 23   GLUCOSE 173* 159* 194*  BUN 8 12 22   CREATININE 3.10* 3.89* 4.78*  CALCIUM 7.0* 7.2* 7.4*  PHOS 1.3* 1.8* 1.9*   Liver Function Tests: Recent Labs  Lab 09/08/19 0500 09/08/19 0500 09/09/19 0500 09/10/19 0425 09/11/19 0440  AST 125*  --  113*  --  61*  ALT 39  --  39  --  29  ALKPHOS 85  --  98  --  69  BILITOT 1.5*  --  1.3*  --  1.5*  PROT 3.8*  --  4.2*  --  4.3*  ALBUMIN 1.6*   < > 1.5* 1.3* 1.2*   < > = values in this interval not displayed.   No results for input(s): LIPASE, AMYLASE in the last 168 hours. CBC: Recent Labs  Lab 08/22/2019 0725 08/24/2019 1542 09/07/19 0516 09/07/19 0516 09/08/19 0500 09/09/19 0500 09/11/19 0440  WBC 7.9  --  7.8  --   --  8.3 6.3  NEUTROABS  --   --   --   --   --  7.1 5.1  HGB 6.8*   < > 9.6*   < > 9.5* 9.8* 9.6*  HCT 21.3*   < > 28.4*   < > 28.7* 30.3* 29.7*  MCV 96.8  --  92.2  --   --  96.5 99.7  PLT 129*  --  129*  --   --  98* 61*   <  > = values in this interval not displayed.   Blood Culture    Component Value Date/Time   SDES URINE, CLEAN CATCH 01/04/2018 1524   SPECREQUEST  01/04/2018 1524    NONE Performed at [facility] [address], [place], [place] [address]    CULT MULTIPLE SPECIES PRESENT, SUGGEST RECOLLECTION (A) 01/04/2018 1524   REPTSTATUS 01/05/2018 FINAL 01/04/2018 1524    Cardiac Enzymes: No results for input(s): CKTOTAL, CKMB, CKMBINDEX, TROPONINI in the last 168 hours. CBG: Recent Labs  Lab 09/10/19 2024 09/11/19 0000 09/11/19 0312 09/11/19 0619 09/11/19 0747  GLUCAP 161* 160* 180* 172* 189*   Iron Studies: No results for input(s): IRON, TIBC, TRANSFERRIN, FERRITIN in the last 72 hours. @lablastinr3 @ Studies/Results: EEG  Result Date: 09/10/2019 Alexis Goodell, MD     09/10/2019  2:09 PM ELECTROENCEPHALOGRAM REPORT Patient: Roberta Bryant  Room #: 6E12C EEG No. ID: [id number] Age: 69 y.o.        Sex: female Requesting Physician: Mikhail Report Date:  09/10/2019       Interpreting Physician: Alexis Goodell History: Roberta Bryant is an 70 y.o. female with altered mental status Medications: Insulin, Colace, Synthroid, Thiamine Conditions of Recording:  This is a 21 channel routine scalp EEG performed with bipolar and monopolar montages arranged in accordance to the international 10/20 system of electrode placement. One channel was dedicated to EKG recording. The patient is in the poorly responsive state. Description:  The background activity is slow and poorly organized.  It consists of a low voltage polymorphic delta activity that is continuous and diffusely distributed.  Also noted are occasional intermittent periodic discharges of triphasic morphology There is no activation of the background noted with stimulation. No epileptiform activity is noted.  Hyperventilation and intermittent photic stimulation were not performed. IMPRESSION: This is an abnormal electroencephalogram due  general background slowing with occasional triphasic waves.  These findings are consistent with a toxic/metabolic encephalopathy.  No epileptiform activity is noted.  Alexis Goodell, MD Neurology [phone] 09/10/2019, 2:02 PM   MR BRAIN WO CONTRAST  Result Date: 09/09/2019 CLINICAL DATA:  Altered mental status EXAM: MRI HEAD WITHOUT CONTRAST TECHNIQUE: Multiplanar, multiecho pulse sequences of the brain and surrounding structures were obtained without intravenous contrast. COMPARISON:  06/07/2019 FINDINGS: Motion artifact present.  SWI sequence is nondiagnostic. Brain: There is a small focus of reduced diffusion in the right centrum semiovale. There is no intracranial mass or significant mass effect. Patchy and confluent areas of T2 hyperintensity in the supratentorial white matter are nonspecific but may reflect mild to moderate chronic microvascular ischemic changes. 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 There is no hydrocephalus or extra-axial fluid collection. Vascular: Major vessel flow voids at the skull base are preserved. Skull and upper cervical spine: Normal marrow signal is preserved. Sinuses/Orbits: Mild mucosal thickening with small left maxillary sinus air-fluid level. Orbits are unremarkable. Other: Sella is unremarkable.  Mastoid air cells are clear. IMPRESSION: Motion degraded study. 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 Nonspecific small left maxillary sinus air-fluid level, which could reflect acute sinusitis in the appropriate clinical setting. Electronically Signed   By: Macy Mis M.D.   On: 09/09/2019 17:44   ECHOCARDIOGRAM COMPLETE  Result Date: 09/10/2019    ECHOCARDIOGRAM REPORT   Patient Name:   Roberta Bryant Date of Exam: 09/10/2019 Medical Rec #:  [id number]          Height:       58.0 in Accession #:    [id number]         Weight:       180.8  lb Date of Birth:  May 22, 1951          BSA:          1.745 m Patient Age:    62 years           BP:           89/59 mmHg Patient Gender: F                  HR:           105 bpm. Exam Location:  Inpatient Procedure: 2D Echo, Cardiac Doppler and Color Doppler Indications:    Stroke 434.91/I163.0  History:  Patient has prior history of Echocardiogram examinations, most                 recent 12/28/2017. Risk Factors:Hypertension and Diabetes. ESRD.  Sonographer:    Clayton Lefort RDCS (AE) Referring Phys: [id number] [facility]  Sonographer Comments: Technically difficult study due to poor echo windows, suboptimal parasternal window, no subcostal window and patient is morbidly obese. Limited patient mobility. IMPRESSIONS  1. Left ventricular ejection fraction, by estimation, is 65 to 70%. The left ventricle has normal function. The left ventricle has no regional wall motion abnormalities. There is mild left ventricular hypertrophy. Left ventricular diastolic parameters are consistent with Grade I diastolic dysfunction (impaired relaxation).  2. Right ventricular systolic function is hyperdynamic. The right ventricular size is normal.  3. Left atrial size was mildly dilated.  4. The mitral valve is abnormal. Trivial mitral valve regurgitation. The mean mitral valve gradient is 4.0 mmHg with average heart rate of 105 bpm.  5. There appears to be mid-LV cavitary obliteration, incresing the doppler gradient across the LVOT, so I suspect the degree of AS is overestimated. That being said, there is probably moderate AS with a calcified valve of indetermine cusps. The aortic valve has an indeterminant number of cusps. Aortic valve regurgitation is mild. Moderate aortic valve stenosis. Aortic regurgitation PHT measures 366 msec. Aortic valve area, by VTI measures 0.95 cm. Aortic valve mean gradient measures 19.2 mmHg. Aortic  valve Vmax measures 3.06 m/s. FINDINGS  Left Ventricle: Left ventricular ejection fraction, by  estimation, is 65 to 70%. The left ventricle has normal function. The left ventricle has no regional wall motion abnormalities. The left ventricular internal cavity size was normal in size. There is  mild left ventricular hypertrophy. Left ventricular diastolic parameters are consistent with Grade I diastolic dysfunction (impaired relaxation). Indeterminate filling pressures. Right Ventricle: The right ventricular size is normal. No increase in right ventricular wall thickness. Right ventricular systolic function is hyperdynamic. Left Atrium: Left atrial size was mildly dilated. Right Atrium: Right atrial size was normal in size. Pericardium: There is no evidence of pericardial effusion. Mitral Valve: The mitral valve is abnormal. There is mild calcification of the mitral valve leaflet(s). Mild to moderate mitral annular calcification. Trivial mitral valve regurgitation. MV peak gradient, 9.5 mmHg. The mean mitral valve gradient is 4.0 mmHg with average heart rate of 105 bpm. Tricuspid Valve: The tricuspid valve is grossly normal. Tricuspid valve regurgitation is trivial. Aortic Valve: There appears to be mid-LV cavitary obliteration, incresing the doppler gradient across the LVOT, so I suspect the degree of AS is overestimated. That being said, there is probably moderate AS with a calcified valve of indetermine cusps. The aortic valve has an indeterminant number of cusps. Aortic valve regurgitation is mild. Aortic regurgitation PHT measures 366 msec. Moderate aortic stenosis is present. Aortic valve mean gradient measures 19.2 mmHg. Aortic valve peak gradient measures  37.4 mmHg. Aortic valve area, by VTI measures 0.95 cm. Pulmonic Valve: The pulmonic valve was grossly normal. Pulmonic valve regurgitation is not visualized. Aorta: The aortic root and ascending aorta are structurally normal, with no evidence of dilitation. IAS/Shunts: No atrial level shunt detected by color flow Doppler.  LEFT VENTRICLE PLAX 2D  LVOT diam:     2.20 cm  Diastology LV SV:         46       LV e' lateral: 8.81 cm/s LV SV Index:   27 LVOT Area:     3.80 cm  RIGHT  VENTRICLE RV Basal diam:  2.70 cm RV S prime:     8.16 cm/s TAPSE (M-mode): 1.9 cm LEFT ATRIUM             Index       RIGHT ATRIUM          Index LA Vol (A2C):   59.8 ml 34.28 ml/m RA Area:     9.32 cm LA Vol (A4C):   47.3 ml 27.11 ml/m RA Volume:   16.50 ml 9.46 ml/m LA Biplane Vol: 53.7 ml 30.78 ml/m  AORTIC VALVE AV Area (Vmax):    1.68 cm AV Area (Vmean):   0.91 cm AV Area (VTI):     0.95 cm AV Vmax:           305.75 cm/s AV Vmean:          200.250 cm/s AV VTI:            0.487 m AV Peak Grad:      37.4 mmHg AV Mean Grad:      19.2 mmHg LVOT Vmax:         135.00 cm/s LVOT Vmean:        47.800 cm/s LVOT VTI:          0.122 m LVOT/AV VTI ratio: 0.25 AI PHT:            366 msec MITRAL VALVE            TRICUSPID VALVE MV Peak grad: 9.5 mmHg  TR Peak grad:   22.7 mmHg MV Mean grad: 4.0 mmHg  TR Vmax:        238.00 cm/s MV Vmax:      1.54 m/s MV Vmean:     82.6 cm/s SHUNTS                         Systemic VTI:  0.12 m                         Systemic Diam: 2.20 cm Lyman Bishop MD Electronically signed by Lyman Bishop MD Signature Date/Time: 09/10/2019/3:44:40 PM    Final    Medications: . sodium chloride Stopped (09/07/19 1550)  . sodium chloride    . sodium chloride 600 mL/hr at 08/13/2019 1649  . [START ON 09/12/2019] sodium chloride    . [START ON 09/12/2019] sodium chloride    . dextrose 5 % and 0.45% NaCl 10 mL/hr at 09/10/19 1827  . thiamine injection 500 mg (09/10/19 2245)  . TPN ADULT (ION) 75 mL/hr at 09/10/19 1749  . TPN ADULT (ION)     . acetaminophen  650 mg Oral Q6H  . calcitRIOL  1 mcg Oral Q M,W,F-HD  . Chlorhexidine Gluconate Cloth  6 each Topical Q0600  . darbepoetin (ARANESP) injection - DIALYSIS  100 mcg Intravenous Q Wed-HD  . docusate sodium  100 mg Oral BID  . feeding supplement  1 Container Oral TID BM  . feeding supplement (PRO-STAT SUGAR  FREE 64)  30 mL Oral TID  . insulin aspart  0-6 Units Subcutaneous Q4H  . levothyroxine  25 mcg Intravenous Daily  . pantoprazole  40 mg Oral Q0600  . sodium chloride flush  10-40 mL Intracatheter Q12H     Dialysis Orders: [facility] MWF  3.5 hr EDW 85 400/A 1.5 3 K 2.25 Ca -Heparin 4000 units IV initial bolus with Heparin 1500 units IV  mid tmt  -Mircera  100 mcg IV q 2 wks -last had 60 on 3/10  -calcitriol 1.0 mcg PO TIW  Binders: fosrenol 2 gm PO TID ac  Assessment/Plan: 1. ABLA 2/2 recurrent diverticular GI bleeding - s/p colonoscopy -->6 polyps removed/non bleeding rectal ulcer. Bleeding recurred and pt went for IR embolization.Bleeding scan 3/28 + active bleed RUQ near hepatic flexure - same area of prior embolization so pt went for right hemicolectomy on 3/30 per [facility]. S/p 6 units prbcs.  2. Elevated temp: Temp today 100.9. Draw BC X 2 while on HD. WBC 6.3.  3. AMS- w/u per primary/neurology consulted. No acute findings head CT 4/1. Consider underlying dementia.  4. ESRD- HD MWF. HD today on schedule. K+ 4.9 5. AVF issues -recent bleeding issues with access - Arm remains swollen but AVF functional.  Consider fistulogram once stable. Per dialysis RN, no further bleeding issues noted this week.  6. Hypotension/volume- BP low. Weights variable. Need to attempt UF while on TPN. Albumin prn, lower dialysate temp. Attempting Net UF 1.5 liters today SBP 80s.  7.  Anemia Hgb stable ~9.7. S/p 6 units prbcs -last 3/30. Continue Aranesp 18mcg q Wed. 8. Metabolic bone disease- Phos low without binders- supplementing phos.  Corr Ca ok.  On Calcitriol for Ca support.  9. Severe malnutrition- Alb <2. Continue pro-stat + nepro. On TPN.  10. CAD - Coreg on hold d/t hypotension  11. DM - Insulin per primary  12. GOC - for d/c to SNF when medically stable.  Rita H. Brown NP-C 09/11/2019, 9:11 AM  [employer] [phone]

## 2019-09-11 NOTE — Progress Notes (Signed)
Patient ID: Roberta Bryant, female   DOB: [birth year], 69 y.o.   MRN: [id number]  This NP visited patient at the bedside as a follow up for palliative medicine needs and emotional support, patient is extremely lethargic and difficult to arouse.  I met with the patient's son/Sergio at bedside for continued conversation regarding current medical situation.    I have spoken with [name] in the past and today we have a continued conversation regarding the seriousness of his mother's medical conditions and her high risk for further decompensation.  We discussed the concepts of human mortality and the limitations of medical interventions to prolong quality of life when the body begins to fail to thrive.  I discussed/educated the natural trajectory of end-stage renal disease.  I discussed/educated on the natural trajectory and expectations at end of life.  Emotional support offered  I discussed/educated on the importance of continued conversation regarding overall plan of care ensuring that decisions are within the context of the patient's values and goals of care.  Questions and concerns addressed    [name] asks me to contact his brother [place], I left a message on his voicemail and await callback  Total time spent on the unit was 25 minutes  Greater than 50% of the time was spent in counseling and coordination of care  Wadie Lessen NP  Palliative Medicine Team Team Phone # [phone] Pager [phone]

## 2019-09-11 NOTE — Progress Notes (Signed)
SLP Cancellation Note  Patient Details Name: Roberta Bryant MRN: [id number] DOB: 05/23/1951   Cancelled treatment:       Reason Eval/Treat Not Completed: Other (comment)(pt at dialysis at this time, per RN, pt has TPN, will follow up tomorrow)   Macario Golds 09/11/2019, 9:00 AM  Kathleen Lime, [place] Office [phone]

## 2019-09-11 NOTE — Progress Notes (Signed)
PHARMACY - TOTAL PARENTERAL NUTRITION CONSULT NOTE   Indication: Prolonged ileus  Patient Measurements: Height: _0  (147.3 cm) Weight: 81.5 kg (179 lb 10.8 oz) IBW/kg (Calculated) : 40.9 TPN AdjBW (KG): 52.3 Body mass index is 37.55 kg/m. Usual Weight: 85kg  Assessment:  51 yof admitted with ABLA in the setting of recurrent diverticular GIB. She required an EGD 3/22 (Nonbleeding erosive gastropathy, peptic duodenitis) and colonoscopy 3/23 (removal of 6 polyps from the right and left colon. Pathology showed tubular adenomas)  found to have a diverticular bleed. Bleeding recurred and now s/p IR embolization.Bleeding scan 3/28 + active bleed RUQ near hepatic flexure - same area of prior embolization.Ongoing bloody stools. S/p 6 units prbcs.Marland Kitchen Ultimately underwent a R hemicolectomy 3/30. TPN was needed post-op as bowel function had not returned due to prolonged ileus.   Glucose / Insulin: Synthroid (given some, held some). Hgb A1C 4.9.  CBGs 150 - 180s. 5 units SSI. Electrolytes: K 4.9 (goal >/= 4), Ca 7.4 adjusts to 9.6 (albumin 1.2). Phos 1.9 remained low. Mg 2 (goal >/= 2) Renal: ESRD on HD MWF. Planned HD today LFTs / TGs: Tbili 1.5 LFT WNL Trigs 162 - stable  Prealbumin / albumin: Albumin 1.2, prealbumin 5.3>5.7 (just at goal rate of TPN may need to increase protein later in week) Intake / Output; MIVF: D51/2NS at 49m/hr. No po intake noted. LBM 3/31. GI Imaging: CT abd 3/25 - Focus of active GI bleeding at hepatic flexure of colon, which demonstrates wall thickening which could be the result of infection, ischemia, or inflammatory bowel disease. Additional thickened small bowel loop in the LEFT mid abdomen question enteritis, associated with dilatation of small bowel loops proximal to this site suggesting a component of obstruction. Question gastric polyps versus redundant mucosa; recommend correlation with prior endoscopy. Small amount of nonspecific perihepatic free fluid. 4/3  Abd xray - distended gas-filled loops of small bowel - likely ileus  Surgeries / Procedures:  3/22 EGD 3/23 Colonoscopy 3/30 Partial colectomy 4/4 bedside swallow - failed; continue NPO  Central access: CVC  TPN start date: 4/2  Nutritional Goals (per RD recommendation on 4/2): kCal: 1900-2100, Protein: 90-105 Goal TPN rate is 75 mL/hr (provides 100 g of protein and 2028 kcals per day)  Current Nutrition:  Clear liquids :  Boost, Prostat: none charted  Plan:  Continue TPN at goal 785mhr - TPN will provide 100g AA, 63g lipids (31% lipids), 293g dextrose (GIF 2.48), 2028 total kcal for 100% needs met Continue standard MVI MWF and trace elements to TPN - remove chromium due to ESRD on HD. (d/c Renal MV while on TPN) Replace electrolytes cautiously in ESRD pt: Increase Na, Phos slightly, no IV replacement of Phos Continue SSI q4h and adjust as needed with increased rate - if remains elevated Tue will need increased SSI Monitor TPN labs on Mon/Thurs  Bmet Mg Phos in AM - replace IV phos tomorrow after HD if remains low  MiBarth KirksPharmD, BCPS, BCCCP Clinical Pharmacist [phone] check AMION for all MCJuana Diazumbers  09/11/2019 8:43 AM

## 2019-09-11 NOTE — Progress Notes (Signed)
Spoke with RN concerning question about TNA, RN made aware that TNA can not be disconnected for MRI, Suggested that TNA be paused and site clamped off during site, and once the patient returns then TNA can be restarted.

## 2019-09-12 DIAGNOSIS — R410 Disorientation, unspecified: Secondary | ICD-10-CM

## 2019-09-12 DIAGNOSIS — Z66 Do not resuscitate: Secondary | ICD-10-CM

## 2019-09-12 DIAGNOSIS — R404 Transient alteration of awareness: Secondary | ICD-10-CM

## 2019-09-12 LAB — COMPREHENSIVE METABOLIC PANEL
ALT: 25 U/L (ref 0–44)
AST: 48 U/L — ABNORMAL HIGH (ref 15–41)
Albumin: 1.5 g/dL — ABNORMAL LOW (ref 3.5–5.0)
Alkaline Phosphatase: 57 U/L (ref 38–126)
Anion gap: 10 (ref 5–15)
BUN: 19 mg/dL (ref 8–23)
CO2: 25 mmol/L (ref 22–32)
Calcium: 7.5 mg/dL — ABNORMAL LOW (ref 8.9–10.3)
Chloride: 99 mmol/L (ref 98–111)
Creatinine, Ser: 3.32 mg/dL — ABNORMAL HIGH (ref 0.44–1.00)
GFR calc Af Amer: 16 mL/min — ABNORMAL LOW (ref >60)
GFR calc non Af Amer: 13 mL/min — ABNORMAL LOW (ref >60)
Glucose, Bld: 192 mg/dL — ABNORMAL HIGH (ref 70–99)
Potassium: 4.8 mmol/L (ref 3.5–5.1)
Sodium: 134 mmol/L — ABNORMAL LOW (ref 135–145)
Total Bilirubin: 1.8 mg/dL — ABNORMAL HIGH (ref 0.3–1.2)
Total Protein: 4.5 g/dL — ABNORMAL LOW (ref 6.5–8.1)

## 2019-09-12 LAB — VITAMIN B12: Vitamin B-12: 1361 pg/mL — ABNORMAL HIGH (ref 180–914)

## 2019-09-12 LAB — TSH: TSH: 2.956 u[IU]/mL (ref 0.350–4.500)

## 2019-09-12 LAB — GLUCOSE, CAPILLARY
Glucose-Capillary: 182 mg/dL — ABNORMAL HIGH (ref 70–99)
Glucose-Capillary: 167 mg/dL — ABNORMAL HIGH (ref 70–99)
Glucose-Capillary: 175 mg/dL — ABNORMAL HIGH (ref 70–99)
Glucose-Capillary: 184 mg/dL — ABNORMAL HIGH (ref 70–99)
Glucose-Capillary: 202 mg/dL — ABNORMAL HIGH (ref 70–99)

## 2019-09-12 LAB — CBC
HCT: 27.9 % — ABNORMAL LOW (ref 36.0–46.0)
Hemoglobin: 9 g/dL — ABNORMAL LOW (ref 12.0–15.0)
MCH: 32.7 pg (ref 26.0–34.0)
MCHC: 32.3 g/dL (ref 30.0–36.0)
MCV: 101.5 fL — ABNORMAL HIGH (ref 80.0–100.0)
Platelets: 63 10*3/uL — ABNORMAL LOW (ref 150–400)
RBC: 2.75 MIL/uL — ABNORMAL LOW (ref 3.87–5.11)
RDW: 23.8 % — ABNORMAL HIGH (ref 11.5–15.5)
WBC: 6 10*3/uL (ref 4.0–10.5)
nRBC: 2.3 % — ABNORMAL HIGH (ref 0.0–0.2)

## 2019-09-12 LAB — MAGNESIUM: Magnesium: 1.8 mg/dL (ref 1.7–2.4)

## 2019-09-12 LAB — PHOSPHORUS: Phosphorus: 1.8 mg/dL — ABNORMAL LOW (ref 2.5–4.6)

## 2019-09-12 MED ORDER — INSULIN ASPART 100 UNIT/ML ~~LOC~~ SOLN
0.0000 [IU] | SUBCUTANEOUS | Status: DC
  Administered 2019-09-12 (×2): 2 [IU] via SUBCUTANEOUS
  Administered 2019-09-12: 3 [IU] via SUBCUTANEOUS
  Administered 2019-09-13 (×2): 2 [IU] via SUBCUTANEOUS

## 2019-09-12 MED ORDER — TRAVASOL 10 % IV SOLN
INTRAVENOUS | Status: DC
  Filled 2019-09-12: qty 1000.8

## 2019-09-12 MED ORDER — SODIUM PHOSPHATES 45 MMOLE/15ML IV SOLN
20.0000 mmol | Freq: Once | INTRAVENOUS | Status: AC
  Administered 2019-09-12: 20 mmol via INTRAVENOUS
  Filled 2019-09-12 (×2): qty 6.67

## 2019-09-12 NOTE — Progress Notes (Signed)
Roberta Bryant Progress Note   Subjective:  HD yesterday with 0.7L removed.  Seen in room. More alert today. Is oriented to person, place, year.  C/o legs hurting.   Objective Vitals:   09/11/19 1200 09/11/19 1206 09/11/19 1939 09/12/19 0540  BP: 120/67 (!) 143/74 120/68 126/60  Pulse: 98 (!) 102 100 (!) 101  Resp:  (!) 23 20 (!) 23  Temp:  99.2 F (37.3 C) 98.2 F (36.8 C) (!) 97.4 F (36.3 C)  TempSrc:  Oral Oral Oral  SpO2:  99%  98%  Weight:  89.2 kg  90.7 kg  Height:       Physical Exam General: Chronically ill appearing female in NAD Heart: V5,I4 2/6 systolic murmur  Lungs: Clear bilaterally  Abdomen: soft non-tender  Extremities: BLE trace-1+ pitting edema, pedal edema Dialysis Access: L AVF + bruit. R arm with large area of ecchymosis.     Additional Objective Labs: Basic Metabolic Panel: Recent Labs  Lab 09/10/19 0425 09/11/19 0440 09/12/19 0340  NA 134* 133* 134*  K 4.1 4.9 4.8  CL 99 99 99  CO2 24 23 25   GLUCOSE 159* 194* 192*  BUN 12 22 19   CREATININE 3.89* 4.78* 3.32*  CALCIUM 7.2* 7.4* 7.5*  PHOS 1.8* 1.9* 1.8*   Liver Function Tests: Recent Labs  Lab 09/09/19 0500 09/09/19 0500 09/10/19 0425 09/11/19 0440 09/12/19 0340  AST 113*  --   --  61* 48*  ALT 39  --   --  29 25  ALKPHOS 98  --   --  69 57  BILITOT 1.3*  --   --  1.5* 1.8*  PROT 4.2*  --   --  4.3* 4.5*  ALBUMIN 1.5*   < > 1.3* 1.2* 1.5*   < > = values in this interval not displayed.   No results for input(s): LIPASE, AMYLASE in the last 168 hours. CBC: Recent Labs  Lab 09/07/19 0516 09/08/19 0500 09/09/19 0500 09/11/19 0440 09/12/19 0340  WBC 7.8  --  8.3 6.3 6.0  NEUTROABS  --   --  7.1 5.1  --   HGB 9.6*   < > 9.8* 9.6* 9.0*  HCT 28.4*   < > 30.3* 29.7* 27.9*  MCV 92.2  --  96.5 99.7 101.5*  PLT 129*  --  98* 61* 63*   < > = values in this interval not displayed.   Blood Culture    Component Value Date/Time   SDES BLOOD RIGHT FOOT  09/11/2019 1458   SPECREQUEST AEROBIC BOTTLE ONLY Blood Culture adequate volume 09/11/2019 1458   CULT  09/11/2019 1458    NO GROWTH < 24 HOURS Performed at [facility], [place] [address], [place], [place] [address]    REPTSTATUS PENDING 09/11/2019 1458    Cardiac Enzymes: No results for input(s): CKTOTAL, CKMB, CKMBINDEX, TROPONINI in the last 168 hours. CBG: Recent Labs  Lab 09/11/19 2055 09/11/19 2357 09/12/19 0537 09/12/19 0822 09/12/19 1124  GLUCAP 177* 172* 182* 167* 175*   Iron Studies: No results for input(s): IRON, TIBC, TRANSFERRIN, FERRITIN in the last 72 hours. @lablastinr3 @ Studies/Results: EEG  Result Date: 09/10/2019 Alexis Goodell, MD     09/10/2019  2:09 PM ELECTROENCEPHALOGRAM REPORT Patient: Roberta Bryant       Room #: 1O84Z EEG No. ID: [id number] Age: 69 y.o.        Sex: female Requesting Physician: Mikhail Report Date:  09/10/2019       Interpreting Physician:  Doy Mince, LESLIE History: Roberta Bryant is an 69 y.o. female with altered mental status Medications: Insulin, Colace, Synthroid, Thiamine Conditions of Recording:  This is a 21 channel routine scalp EEG performed with bipolar and monopolar montages arranged in accordance to the international 10/20 system of electrode placement. One channel was dedicated to EKG recording. The patient is in the poorly responsive state. Description:  The background activity is slow and poorly organized.  It consists of a low voltage polymorphic delta activity that is continuous and diffusely distributed.  Also noted are occasional intermittent periodic discharges of triphasic morphology There is no activation of the background noted with stimulation. No epileptiform activity is noted.  Hyperventilation and intermittent photic stimulation were not performed. IMPRESSION: This is an abnormal electroencephalogram due general background slowing with occasional triphasic waves.  These findings are consistent with a toxic/metabolic  encephalopathy.  No epileptiform activity is noted.  Alexis Goodell, MD Neurology [phone] 09/10/2019, 2:02 PM   DG Abd 1 View  Result Date: 09/11/2019 CLINICAL DATA:  Gastrointestinal bleeding. EXAM: ABDOMEN - 1 VIEW COMPARISON:  September 09, 2019. FINDINGS: Stable dilated small bowel loops are noted concerning for distal small bowel obstruction or possibly ileus. Midline surgical staples are noted. Status post cholecystectomy. No definite colonic dilatation is noted. IMPRESSION: Stable dilated small bowel loops are noted concerning for distal small bowel obstruction or possibly ileus. Electronically Signed   By: Marijo Conception M.D.   On: 09/11/2019 10:51   MR ANGIO HEAD WO CONTRAST  Result Date: 09/11/2019 CLINICAL DATA:  Follow-up examination for acute stroke. Altered mental status. EXAM: MRA HEAD WITHOUT CONTRAST TECHNIQUE: Angiographic images of the Circle of Willis were obtained using MRA technique without intravenous contrast. COMPARISON:  Prior MRI from 09/09/2019 as well as prior CTA from 03/29/2019. FINDINGS: ANTERIOR CIRCULATION: Examination severely degraded by motion artifact, markedly limiting assessment. Visualized distal cervical segments of the internal carotid arteries are grossly patent with antegrade flow. Petrous segments widely patent bilaterally. Cavernous/supraclinoid ICAs grossly patent, although evaluation markedly limited by motion artifact. No obvious high-grade stenosis. A1 segments patent bilaterally. Grossly negative anterior communicating artery complex. Anterior cerebral arteries grossly perfused to their distal aspects without obvious stenosis. Neither M1 segment well assessed due to motion. Distal MCA branches perfused and grossly symmetric. Neither MCA bifurcation well evaluated. POSTERIOR CIRCULATION: Dominant left vertebral artery patent to the vertebrobasilar junction without appreciable stenosis. Left PICA not seen. 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 Right PICA not seen. Basilar patent to its distal aspect without obvious stenosis. Superior cerebral arteries patent at their origins. Left PCA supplied primarily via the basilar. Right PCA supplied via the basilar as well as a small right posterior communicating artery. Both PCAs appear grossly perfused to their distal aspects on time-of-flight sequence. Evaluation for possible aneurysm or other vascular abnormality severely limited on this motion degraded exam. IMPRESSION: 1. Markedly limited exam due to severe motion artifact. 2. Grossly stable appearance of the intracranial circulation, with no new large vessel occlusion. No other definite new or progressive findings identified on this limited exam. 3. Occluded hypoplastic right vertebral artery, also seen on prior CTA from 03/29/2019. Electronically Signed   By: Jeannine Boga M.D.   On: 09/11/2019 19:01   DG CHEST PORT 1 VIEW  Addendum Date: 09/11/2019   ADDENDUM REPORT: 09/11/2019 11:08 ADDENDUM: Findings were discussed with Dr. Jonnie Finner who indicated that the patient is s/p recent right hemicolectomy for GI bleeding. Indeed, patient is status  post right hemicolectomy on 08/08/2019. Although no gas was seen on the portable chest x-ray of 09/07/2019, the patient had a supine abdomen on 09/09/2019 that did demonstrate a Rigler's sign, consistent with the presence of intraperitoneal free. Gas is not unexpected 7-10 days after surgery. Electronically Signed   By: Misty Stanley M.D.   On: 09/11/2019 11:08   Result Date: 09/11/2019 CLINICAL DATA:  GI bleed.  CHF. EXAM: PORTABLE CHEST 1 VIEW COMPARISON:  09/07/2019 FINDINGS: 1036 hours. Low lung volumes. Cardiopericardial silhouette is at upper limits of normal for size. No overt pulmonary edema with streaky opacity in the right mid lung and left base suggesting atelectasis. Right IJ central line tip overlies the mid SVC level. Lucency identified under both hemidiaphragms, consistent  with intraperitoneal free air. Probable Rigler sign left abdomen, also suggesting intraperitoneal free air. Status post right shoulder replacement. Telemetry leads overlie the chest. IMPRESSION: 1. Intraperitoneal free air without reported history of recent surgery. Bowel perforation a major concern. 2. Low lung volumes with borderline cardiomegaly and streaky bilateral airspace opacities suggesting atelectasis. Electronically Signed: By: Misty Stanley M.D. On: 09/11/2019 10:56   ECHOCARDIOGRAM COMPLETE  Result Date: 09/10/2019    ECHOCARDIOGRAM REPORT   Patient Name:   Roberta Bryant Date of Exam: 09/10/2019 Medical Rec #:  [id number]          Height:       58.0 in Accession #:    [id number]         Weight:       180.8 lb Date of Birth:  04/11/1951          BSA:          1.745 m Patient Age:    23 years           BP:           89/59 mmHg Patient Gender: F                  HR:           105 bpm. Exam Location:  Inpatient Procedure: 2D Echo, Cardiac Doppler and Color Doppler Indications:    Stroke 434.91/I163.0  History:        Patient has prior history of Echocardiogram examinations, most                 recent 12/28/2017. Risk Factors:Hypertension and Diabetes. ESRD.  Sonographer:    Clayton Lefort RDCS (AE) Referring Phys: [id number] [facility]  Sonographer Comments: Technically difficult study due to poor echo windows, suboptimal parasternal window, no subcostal window and patient is morbidly obese. Limited patient mobility. IMPRESSIONS  1. Left ventricular ejection fraction, by estimation, is 65 to 70%. The left ventricle has normal function. The left ventricle has no regional wall motion abnormalities. There is mild left ventricular hypertrophy. Left ventricular diastolic parameters are consistent with Grade I diastolic dysfunction (impaired relaxation).  2. Right ventricular systolic function is hyperdynamic. The right ventricular size is normal.  3. Left atrial size was mildly dilated.  4. The mitral valve  is abnormal. Trivial mitral valve regurgitation. The mean mitral valve gradient is 4.0 mmHg with average heart rate of 105 bpm.  5. There appears to be mid-LV cavitary obliteration, incresing the doppler gradient across the LVOT, so I suspect the degree of AS is overestimated. That being said, there is probably moderate AS with a calcified valve of indetermine cusps. The aortic valve has an indeterminant number of cusps. Aortic valve regurgitation  is mild. Moderate aortic valve stenosis. Aortic regurgitation PHT measures 366 msec. Aortic valve area, by VTI measures 0.95 cm. Aortic valve mean gradient measures 19.2 mmHg. Aortic  valve Vmax measures 3.06 m/s. FINDINGS  Left Ventricle: Left ventricular ejection fraction, by estimation, is 65 to 70%. The left ventricle has normal function. The left ventricle has no regional wall motion abnormalities. The left ventricular internal cavity size was normal in size. There is  mild left ventricular hypertrophy. Left ventricular diastolic parameters are consistent with Grade I diastolic dysfunction (impaired relaxation). Indeterminate filling pressures. Right Ventricle: The right ventricular size is normal. No increase in right ventricular wall thickness. Right ventricular systolic function is hyperdynamic. Left Atrium: Left atrial size was mildly dilated. Right Atrium: Right atrial size was normal in size. Pericardium: There is no evidence of pericardial effusion. Mitral Valve: The mitral valve is abnormal. There is mild calcification of the mitral valve leaflet(s). Mild to moderate mitral annular calcification. Trivial mitral valve regurgitation. MV peak gradient, 9.5 mmHg. The mean mitral valve gradient is 4.0 mmHg with average heart rate of 105 bpm. Tricuspid Valve: The tricuspid valve is grossly normal. Tricuspid valve regurgitation is trivial. Aortic Valve: There appears to be mid-LV cavitary obliteration, incresing the doppler gradient across the LVOT, so I suspect the  degree of AS is overestimated. That being said, there is probably moderate AS with a calcified valve of indetermine cusps. The aortic valve has an indeterminant number of cusps. Aortic valve regurgitation is mild. Aortic regurgitation PHT measures 366 msec. Moderate aortic stenosis is present. Aortic valve mean gradient measures 19.2 mmHg. Aortic valve peak gradient measures  37.4 mmHg. Aortic valve area, by VTI measures 0.95 cm. Pulmonic Valve: The pulmonic valve was grossly normal. Pulmonic valve regurgitation is not visualized. Aorta: The aortic root and ascending aorta are structurally normal, with no evidence of dilitation. IAS/Shunts: No atrial level shunt detected by color flow Doppler.  LEFT VENTRICLE PLAX 2D LVOT diam:     2.20 cm  Diastology LV SV:         46       LV e' lateral: 8.81 cm/s LV SV Index:   27 LVOT Area:     3.80 cm  RIGHT VENTRICLE RV Basal diam:  2.70 cm RV S prime:     8.16 cm/s TAPSE (M-mode): 1.9 cm LEFT ATRIUM             Index       RIGHT ATRIUM          Index LA Vol (A2C):   59.8 ml 34.28 ml/m RA Area:     9.32 cm LA Vol (A4C):   47.3 ml 27.11 ml/m RA Volume:   16.50 ml 9.46 ml/m LA Biplane Vol: 53.7 ml 30.78 ml/m  AORTIC VALVE AV Area (Vmax):    1.68 cm AV Area (Vmean):   0.91 cm AV Area (VTI):     0.95 cm AV Vmax:           305.75 cm/s AV Vmean:          200.250 cm/s AV VTI:            0.487 m AV Peak Grad:      37.4 mmHg AV Mean Grad:      19.2 mmHg LVOT Vmax:         135.00 cm/s LVOT Vmean:        47.800 cm/s LVOT VTI:          0.122  m LVOT/AV VTI ratio: 0.25 AI PHT:            366 msec MITRAL VALVE            TRICUSPID VALVE MV Peak grad: 9.5 mmHg  TR Peak grad:   22.7 mmHg MV Mean grad: 4.0 mmHg  TR Vmax:        238.00 cm/s MV Vmax:      1.54 m/s MV Vmean:     82.6 cm/s SHUNTS                         Systemic VTI:  0.12 m                         Systemic Diam: 2.20 cm Lyman Bishop MD Electronically signed by Lyman Bishop MD Signature Date/Time: 09/10/2019/3:44:40 PM     Final    VAS US CAROTID  Result Date: 09/11/2019 Carotid Arterial Duplex Study Indications:       CVA and Recent Covid-19. Risk Factors:      Hypertension. Limitations        Today's exam was limited due to a central line, bandages,                    patient with altered mental status and the body habitus of                    the patient. Comparison Study:  Prior study from 09/11/2016 is available for comparison Performing Technologist: Sharion Dove RVS  Examination Guidelines: A complete evaluation includes B-mode imaging, spectral Doppler, color Doppler, and power Doppler as needed of all accessible portions of each vessel. Bilateral testing is considered an integral part of a complete examination. Limited examinations for reoccurring indications may be performed as noted.  Right Carotid Findings: Unable to insonate secondary to line/bandages Left Carotid Findings: +----------+--------+--------+--------+------------------+--------+           PSV cm/sEDV cm/sStenosisPlaque DescriptionComments +----------+--------+--------+--------+------------------+--------+ CCA Prox  33      12              heterogenous               +----------+--------+--------+--------+------------------+--------+ CCA Distal30      9               heterogenous               +----------+--------+--------+--------+------------------+--------+ ICA Prox  45      18              heterogenous               +----------+--------+--------+--------+------------------+--------+ ICA Distal65      24                                         +----------+--------+--------+--------+------------------+--------+ ECA       33      1                                          +----------+--------+--------+--------+------------------+--------+ +----------+--------+--------+--------+-------------------+           PSV cm/sEDV cm/sDescribeArm Pressure (mmHG) +----------+--------+--------+--------+-------------------+  [id number]                                          +----------+--------+--------+--------+-------------------+ +---------+--------+--+--------+--+---------+  VertebralPSV cm/s37EDV cm/s12Antegrade +---------+--------+--+--------+--+---------+   Summary:  Left Carotid: The extracranial vessels were near-normal with only minimal wall               thickening or plaque. Vertebrals:  Left vertebral artery demonstrates antegrade flow. Subclavians: Normal flow hemodynamics were seen in the left subclavian artery. *See table(s) above for measurements and observations.     Preliminary    Medications: . sodium chloride Stopped (09/07/19 1550)  . sodium chloride    . sodium chloride 10 mL/hr at 09/11/19 1544  . sodium phosphate  Dextrose 5% IVPB    . TPN ADULT (ION) 75 mL/hr at 09/11/19 1821  . TPN ADULT (ION)     . acetaminophen  650 mg Oral Q6H  . calcitRIOL  1 mcg Oral Q M,W,F-HD  . Chlorhexidine Gluconate Cloth  6 each Topical Q0600  . darbepoetin (ARANESP) injection - DIALYSIS  100 mcg Intravenous Q Wed-HD  . docusate sodium  100 mg Oral BID  . feeding supplement  1 Container Oral TID BM  . feeding supplement (PRO-STAT SUGAR FREE 64)  30 mL Oral TID  . insulin aspart  0-9 Units Subcutaneous Q4H  . levothyroxine  25 mcg Intravenous Daily  . pantoprazole  40 mg Oral Q0600  . sodium chloride flush  10-40 mL Intracatheter Q12H     Dialysis Orders: [facility] MWF  3.5 hr EDW 85 400/A 1.5 3 K 2.25 Ca -Heparin 4000 units IV initial bolus with Heparin 1500 units IV  mid tmt  -Mircera 100 mcg IV q 2 wks -last had 60 on 3/10  -calcitriol 1.0 mcg PO TIW  Binders: fosrenol 2 gm PO TID ac  Assessment/Plan: 1. ABLA 2/2 recurrent diverticular GI bleeding - s/p colonoscopy -->6 polyps removed/non bleeding rectal ulcer. Bleeding recurred and pt went for IR embolization.Bleeding scan 3/28 + active bleed RUQ near hepatic flexure - same area of prior embolization with ongoing bloody stools >>s/p  right hemicolectomy on 3/30 per [facility] Surgery. S/p 6 units prbcs.  2. ESRD. HD MWF. Next HD 4/7.  3. Fever spikes: Up to 100.58F on 4/5. Afebrile overnight. BC ng to date.   4. AMS- w/u per primary/neurology consulted. 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 5. AVF issues -recent bleeding issues with access - Arm remains swollen but AVF functional.  Consider fistulogram once stable. Per dialysis RN, no further bleeding issues noted this week.  6. Hypotension/volume- BP low. Weights variable. Need to attempt UF while on TPN. Albumin prn, lower dialysate temp. Attempting Net UF 1.5L next HD.  7.  Anemia Hgb 9.0. S/p 6 units prbcs -last 3/30. Continue Aranesp 164mcg q Wed. 8. Metabolic bone disease- Phos low without binders- supplementing phos.  Corr Ca ok.  On Calcitriol for Ca support.  9. Severe malnutrition- Alb <2. Continue pro-stat + nepro. On TPN.  10. CAD - Coreg on hold d/t hypotension  11. DM - Insulin per primary  12. GOC - for d/c to SNF when medically stable.  Lynnda Child PA-C [facility] Pager [phone] 09/12/2019,12:03 PM

## 2019-09-12 NOTE — Progress Notes (Signed)
Patient ID: Roberta Bryant, female   DOB: Nov 04, 1950, 69 y.o.   MRN: [id number]    7 Days Post-Op  Subjective: Denies pain this morning, denies bowel movement.  ROS: See above, otherwise other systems negative  Objective: Vital signs in last 24 hours: Temp:  [97.4 F (36.3 C)-100.9 F (38.3 C)] 97.4 F (36.3 C) (04/06 0540) Pulse Rate:  [97-111] 101 (04/06 0540) Resp:  [20-30] 23 (04/06 0540) BP: (76-143)/(40-74) 126/60 (04/06 0540) SpO2:  [98 %-99 %] 98 % (04/06 0540) Weight:  [81.5 kg-90.7 kg] 90.7 kg (04/06 0540) Last BM Date: 09/06/19  Intake/Output from previous day: 04/05 0701 - 04/06 0700 In: -  Out: 721  Intake/Output this shift: No intake/output data recorded.  PE: Abd: Obese, tender along midline incision and in the left hemiabdomen.  There is a small amount of serosanguineous drainage through the midline wound, there is no surrounding erythema  Lab Results:  Recent Labs    09/11/19 0440 09/12/19 0340  WBC 6.3 6.0  HGB 9.6* 9.0*  HCT 29.7* 27.9*  PLT 61* 63*   BMET Recent Labs    09/11/19 0440 09/12/19 0340  NA 133* 134*  K 4.9 4.8  CL 99 99  CO2 23 25  GLUCOSE 194* 192*  BUN 22 19  CREATININE 4.78* 3.32*  CALCIUM 7.4* 7.5*   PT/INR Recent Labs    09/10/19 0425  LABPROT 14.5  INR 1.1   CMP     Component Value Date/Time   NA 134 (L) 09/12/2019 0340   K 4.8 09/12/2019 0340   CL 99 09/12/2019 0340   CO2 25 09/12/2019 0340   GLUCOSE 192 (H) 09/12/2019 0340   BUN 19 09/12/2019 0340   CREATININE 3.32 (H) 09/12/2019 0340   CALCIUM 7.5 (L) 09/12/2019 0340   CALCIUM 7.4 (L) 09/12/2008 0615   PROT 4.5 (L) 09/12/2019 0340   ALBUMIN 1.5 (L) 09/12/2019 0340   AST 48 (H) 09/12/2019 0340   ALT 25 09/12/2019 0340   ALKPHOS 57 09/12/2019 0340   BILITOT 1.8 (H) 09/12/2019 0340   GFRNONAA 13 (L) 09/12/2019 0340   GFRAA 16 (L) 09/12/2019 0340   Lipase     Component Value Date/Time   LIPASE 43 07/19/2018 2330        Studies/Results: EEG  Result Date: 09/10/2019 Alexis Goodell, MD     09/10/2019  2:09 PM ELECTROENCEPHALOGRAM REPORT Patient: Roberta Bryant       Room #: 4U98J EEG No. ID: [id number] Age: 69 y.o.        Sex: female Requesting Physician: Mikhail Report Date:  09/10/2019       Interpreting Physician: Alexis Goodell History: Roberta Bryant is an 69 y.o. female with altered mental status Medications: Insulin, Colace, Synthroid, Thiamine Conditions of Recording:  This is a 21 channel routine scalp EEG performed with bipolar and monopolar montages arranged in accordance to the international 10/20 system of electrode placement. One channel was dedicated to EKG recording. The patient is in the poorly responsive state. Description:  The background activity is slow and poorly organized.  It consists of a low voltage polymorphic delta activity that is continuous and diffusely distributed.  Also noted are occasional intermittent periodic discharges of triphasic morphology There is no activation of the background noted with stimulation. No epileptiform activity is noted.  Hyperventilation and intermittent photic stimulation were not performed. IMPRESSION: This is an abnormal electroencephalogram due general background slowing with occasional triphasic waves.  These findings are consistent with a  toxic/metabolic encephalopathy.  No epileptiform activity is noted.  Alexis Goodell, MD Neurology [phone] 09/10/2019, 2:02 PM   DG Abd 1 View  Result Date: 09/11/2019 CLINICAL DATA:  Gastrointestinal bleeding. EXAM: ABDOMEN - 1 VIEW COMPARISON:  September 09, 2019. FINDINGS: Stable dilated small bowel loops are noted concerning for distal small bowel obstruction or possibly ileus. Midline surgical staples are noted. Status post cholecystectomy. No definite colonic dilatation is noted. IMPRESSION: Stable dilated small bowel loops are noted concerning for distal small bowel obstruction or possibly ileus.  Electronically Signed   By: Marijo Conception M.D.   On: 09/11/2019 10:51   MR ANGIO HEAD WO CONTRAST  Result Date: 09/11/2019 CLINICAL DATA:  Follow-up examination for acute stroke. Altered mental status. EXAM: MRA HEAD WITHOUT CONTRAST TECHNIQUE: Angiographic images of the Circle of Willis were obtained using MRA technique without intravenous contrast. COMPARISON:  Prior MRI from 09/09/2019 as well as prior CTA from 03/29/2019. FINDINGS: ANTERIOR CIRCULATION: Examination severely degraded by motion artifact, markedly limiting assessment. Visualized distal cervical segments of the internal carotid arteries are grossly patent with antegrade flow. Petrous segments widely patent bilaterally. Cavernous/supraclinoid ICAs grossly patent, although evaluation markedly limited by motion artifact. No obvious high-grade stenosis. A1 segments patent bilaterally. Grossly negative anterior communicating artery complex. Anterior cerebral arteries grossly perfused to their distal aspects without obvious stenosis. Neither M1 segment well assessed due to motion. Distal MCA branches perfused and grossly symmetric. Neither MCA bifurcation well evaluated. POSTERIOR CIRCULATION: Dominant left vertebral artery patent to the vertebrobasilar junction without appreciable stenosis. Left PICA not seen. 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 Right PICA not seen. Basilar patent to its distal aspect without obvious stenosis. Superior cerebral arteries patent at their origins. Left PCA supplied primarily via the basilar. Right PCA supplied via the basilar as well as a small right posterior communicating artery. Both PCAs appear grossly perfused to their distal aspects on time-of-flight sequence. Evaluation for possible aneurysm or other vascular abnormality severely limited on this motion degraded exam. IMPRESSION: 1. Markedly limited exam due to severe motion artifact. 2. Grossly stable appearance of the  intracranial circulation, with no new large vessel occlusion. No other definite new or progressive findings identified on this limited exam. 3. Occluded hypoplastic right vertebral artery, also seen on prior CTA from 03/29/2019. Electronically Signed   By: Jeannine Boga M.D.   On: 09/11/2019 19:01   DG CHEST PORT 1 VIEW  Addendum Date: 09/11/2019   ADDENDUM REPORT: 09/11/2019 11:08 ADDENDUM: Findings were discussed with Dr. Jonnie Finner who indicated that the patient is s/p recent right hemicolectomy for GI bleeding. Indeed, patient is status post right hemicolectomy on 08/07/2019. Although no gas was seen on the portable chest x-ray of 09/07/2019, the patient had a supine abdomen on 09/09/2019 that did demonstrate a Rigler's sign, consistent with the presence of intraperitoneal free. Gas is not unexpected 7-10 days after surgery. Electronically Signed   By: Misty Stanley M.D.   On: 09/11/2019 11:08   Result Date: 09/11/2019 CLINICAL DATA:  GI bleed.  CHF. EXAM: PORTABLE CHEST 1 VIEW COMPARISON:  09/07/2019 FINDINGS: 1036 hours. Low lung volumes. Cardiopericardial silhouette is at upper limits of normal for size. No overt pulmonary edema with streaky opacity in the right mid lung and left base suggesting atelectasis. Right IJ central line tip overlies the mid SVC level. Lucency identified under both hemidiaphragms, consistent with intraperitoneal free air. Probable Rigler sign left abdomen, also suggesting intraperitoneal free air. Status post right  shoulder replacement. Telemetry leads overlie the chest. IMPRESSION: 1. Intraperitoneal free air without reported history of recent surgery. Bowel perforation a major concern. 2. Low lung volumes with borderline cardiomegaly and streaky bilateral airspace opacities suggesting atelectasis. Electronically Signed: By: Misty Stanley M.D. On: 09/11/2019 10:56   ECHOCARDIOGRAM COMPLETE  Result Date: 09/10/2019    ECHOCARDIOGRAM REPORT   Patient Name:   CLARE FENNIMORE Date of Exam: 09/10/2019 Medical Rec #:  [id number]          Height:       58.0 in Accession #:    [id number]         Weight:       180.8 lb Date of Birth:  16-Jun-1950          BSA:          1.745 m Patient Age:    30 years           BP:           89/59 mmHg Patient Gender: F                  HR:           105 bpm. Exam Location:  Inpatient Procedure: 2D Echo, Cardiac Doppler and Color Doppler Indications:    Stroke 434.91/I163.0  History:        Patient has prior history of Echocardiogram examinations, most                 recent 12/28/2017. Risk Factors:Hypertension and Diabetes. ESRD.  Sonographer:    Clayton Lefort RDCS (AE) Referring Phys: [id number] [facility]  Sonographer Comments: Technically difficult study due to poor echo windows, suboptimal parasternal window, no subcostal window and patient is morbidly obese. Limited patient mobility. IMPRESSIONS  1. Left ventricular ejection fraction, by estimation, is 65 to 70%. The left ventricle has normal function. The left ventricle has no regional wall motion abnormalities. There is mild left ventricular hypertrophy. Left ventricular diastolic parameters are consistent with Grade I diastolic dysfunction (impaired relaxation).  2. Right ventricular systolic function is hyperdynamic. The right ventricular size is normal.  3. Left atrial size was mildly dilated.  4. The mitral valve is abnormal. Trivial mitral valve regurgitation. The mean mitral valve gradient is 4.0 mmHg with average heart rate of 105 bpm.  5. There appears to be mid-LV cavitary obliteration, incresing the doppler gradient across the LVOT, so I suspect the degree of AS is overestimated. That being said, there is probably moderate AS with a calcified valve of indetermine cusps. The aortic valve has an indeterminant number of cusps. Aortic valve regurgitation is mild. Moderate aortic valve stenosis. Aortic regurgitation PHT measures 366 msec. Aortic valve area, by VTI measures 0.95 cm. Aortic  valve mean gradient measures 19.2 mmHg. Aortic  valve Vmax measures 3.06 m/s. FINDINGS  Left Ventricle: Left ventricular ejection fraction, by estimation, is 65 to 70%. The left ventricle has normal function. The left ventricle has no regional wall motion abnormalities. The left ventricular internal cavity size was normal in size. There is  mild left ventricular hypertrophy. Left ventricular diastolic parameters are consistent with Grade I diastolic dysfunction (impaired relaxation). Indeterminate filling pressures. Right Ventricle: The right ventricular size is normal. No increase in right ventricular wall thickness. Right ventricular systolic function is hyperdynamic. Left Atrium: Left atrial size was mildly dilated. Right Atrium: Right atrial size was normal in size. Pericardium: There is no evidence of pericardial effusion. Mitral Valve: The mitral  valve is abnormal. There is mild calcification of the mitral valve leaflet(s). Mild to moderate mitral annular calcification. Trivial mitral valve regurgitation. MV peak gradient, 9.5 mmHg. The mean mitral valve gradient is 4.0 mmHg with average heart rate of 105 bpm. Tricuspid Valve: The tricuspid valve is grossly normal. Tricuspid valve regurgitation is trivial. Aortic Valve: There appears to be mid-LV cavitary obliteration, incresing the doppler gradient across the LVOT, so I suspect the degree of AS is overestimated. That being said, there is probably moderate AS with a calcified valve of indetermine cusps. The aortic valve has an indeterminant number of cusps. Aortic valve regurgitation is mild. Aortic regurgitation PHT measures 366 msec. Moderate aortic stenosis is present. Aortic valve mean gradient measures 19.2 mmHg. Aortic valve peak gradient measures  37.4 mmHg. Aortic valve area, by VTI measures 0.95 cm. Pulmonic Valve: The pulmonic valve was grossly normal. Pulmonic valve regurgitation is not visualized. Aorta: The aortic root and ascending aorta are  structurally normal, with no evidence of dilitation. IAS/Shunts: No atrial level shunt detected by color flow Doppler.  LEFT VENTRICLE PLAX 2D LVOT diam:     2.20 cm  Diastology LV SV:         46       LV e' lateral: 8.81 cm/s LV SV Index:   27 LVOT Area:     3.80 cm  RIGHT VENTRICLE RV Basal diam:  2.70 cm RV S prime:     8.16 cm/s TAPSE (M-mode): 1.9 cm LEFT ATRIUM             Index       RIGHT ATRIUM          Index LA Vol (A2C):   59.8 ml 34.28 ml/m RA Area:     9.32 cm LA Vol (A4C):   47.3 ml 27.11 ml/m RA Volume:   16.50 ml 9.46 ml/m LA Biplane Vol: 53.7 ml 30.78 ml/m  AORTIC VALVE AV Area (Vmax):    1.68 cm AV Area (Vmean):   0.91 cm AV Area (VTI):     0.95 cm AV Vmax:           305.75 cm/s AV Vmean:          200.250 cm/s AV VTI:            0.487 m AV Peak Grad:      37.4 mmHg AV Mean Grad:      19.2 mmHg LVOT Vmax:         135.00 cm/s LVOT Vmean:        47.800 cm/s LVOT VTI:          0.122 m LVOT/AV VTI ratio: 0.25 AI PHT:            366 msec MITRAL VALVE            TRICUSPID VALVE MV Peak grad: 9.5 mmHg  TR Peak grad:   22.7 mmHg MV Mean grad: 4.0 mmHg  TR Vmax:        238.00 cm/s MV Vmax:      1.54 m/s MV Vmean:     82.6 cm/s SHUNTS                         Systemic VTI:  0.12 m                         Systemic Diam: 2.20 cm Lyman Bishop MD Electronically signed by  Lyman Bishop MD Signature Date/Time: 09/10/2019/3:44:40 PM    Final    VAS US CAROTID  Result Date: 09/11/2019 Carotid Arterial Duplex Study Indications:       CVA and Recent Covid-19. Risk Factors:      Hypertension. Limitations        Today's exam was limited due to a central line, bandages,                    patient with altered mental status and the body habitus of                    the patient. Comparison Study:  Prior study from 09/11/2016 is available for comparison Performing Technologist: Sharion Dove RVS  Examination Guidelines: A complete evaluation includes B-mode imaging, spectral Doppler, color Doppler, and power  Doppler as needed of all accessible portions of each vessel. Bilateral testing is considered an integral part of a complete examination. Limited examinations for reoccurring indications may be performed as noted.  Right Carotid Findings: Unable to insonate secondary to line/bandages Left Carotid Findings: +----------+--------+--------+--------+------------------+--------+           PSV cm/sEDV cm/sStenosisPlaque DescriptionComments +----------+--------+--------+--------+------------------+--------+ CCA Prox  33      12              heterogenous               +----------+--------+--------+--------+------------------+--------+ CCA Distal30      9               heterogenous               +----------+--------+--------+--------+------------------+--------+ ICA Prox  45      18              heterogenous               +----------+--------+--------+--------+------------------+--------+ ICA Distal65      24                                         +----------+--------+--------+--------+------------------+--------+ ECA       33      1                                          +----------+--------+--------+--------+------------------+--------+ +----------+--------+--------+--------+-------------------+           PSV cm/sEDV cm/sDescribeArm Pressure (mmHG) +----------+--------+--------+--------+-------------------+ [id number]                                          +----------+--------+--------+--------+-------------------+ +---------+--------+--+--------+--+---------+ VertebralPSV cm/s37EDV cm/s12Antegrade +---------+--------+--+--------+--+---------+   Summary:  Left Carotid: The extracranial vessels were near-normal with only minimal wall               thickening or plaque. Vertebrals:  Left vertebral artery demonstrates antegrade flow. Subclavians: Normal flow hemodynamics were seen in the left subclavian artery. *See table(s) above for measurements and  observations.     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 Ninfa Linden - 09/04/2019 - Mobilize, PT. PT currently recommending SNF - Pulm toilet, IS - xray 4/3 confirmed ileus. Afebrile, no leukocytosis - Continue TPN. NPO until speech eval, if she passes ok to place back on clears.    FEN -NPO/TPN VTE -SCDsonly, thrombocytopenia ID -Cefotetan peri-op Foley -None Follow up -Dr. Ninfa Linden   LOS: 53 days    Clovis Riley , [facility] 09/12/2019, 8:37 AM See AMION for floor coverage

## 2019-09-12 NOTE — Progress Notes (Signed)
Physical Therapy Treatment Patient Details Name: Roberta Bryant MRN: [id number] DOB: Jun 29, 1950 Today's Date: 09/12/2019    History of Present Illness Pt is a 69 y/o female admitted secondary to acute blood loss anemia, likely from GI source. Pt is s/p endoscopy and s/p colonoscopy with cold snare polypectomy X6. PMH includes ESRD on HD MWF and GERD.     PT Comments    Pt did not tolerate tx well today, was noted to have very flat affect minimally mumbling throughout session. Was total assist with all mobility, noted to inly have trace movements in BUE and RLE this session. Total a to roll in bed, scoot up to position, supine <>sit. Was able to sit edge of bed approx 75mins with min/mod a to maintain sitting. At rest HR 112 with mobility HR increase to max 122bpm, sats in 90s in room air throughout even through pt noted to have shallow breathing, (02 measured via finger probe). Will continue to follow acutely and continue to recommend d/c to SNF.      Follow Up Recommendations  SNF;Supervision/Assistance - 24 hour     Equipment Recommendations  Hospital bed;Other (comment)    Recommendations for Other Services       Precautions / Restrictions Precautions Precautions: Fall Precaution Comments: monitor HR Restrictions Weight Bearing Restrictions: No    Mobility  Bed Mobility Overal bed mobility: Needs Assistance Bed Mobility: Rolling;Supine to Sit;Sit to Supine Rolling: Total assist   Supine to sit: Total assist Sit to supine: Total assist   General bed mobility comments: no effort noted from pt with any movement this session  Transfers                 General transfer comment: did not attempt as pt is total with al mobility with trace movements noted in BUE and RLE in supine  Ambulation/Gait             General Gait Details: unable   Stairs             Wheelchair Mobility    Modified Rankin (Stroke Patients Only)       Balance Overall  balance assessment: Needs assistance Sitting-balance support: Feet supported Sitting balance-Leahy Scale: Poor Sitting balance - Comments: able to sit edge of bed but need min/mod a to maintain sitting noted to be rocking at times     Standing balance-Leahy Scale: Zero Standing balance comment: unable                            Cognition Arousal/Alertness: Awake/alert Behavior During Therapy: Flat affect Overall Cognitive Status: Impaired/Different from baseline                                        Exercises      General Comments General comments (skin integrity, edema, etc.): at rest HR 112 with mobility HR increase to max 122bpm, sats in 90s in room air throughout even through pt noted to have shallow breathing, (02 measured via finger probe)      Pertinent Vitals/Pain Pain Assessment: Faces Faces Pain Scale: Hurts even more Pain Location: w/ mobility Pain Descriptors / Indicators: Grimacing;Guarding;Discomfort Pain Intervention(s): Limited activity within patient's tolerance;Monitored during session    Home Living  Prior Function            PT Goals (current goals can now be found in the care plan section) Acute Rehab PT Goals Patient Stated Goal: states is going home today PT Goal Formulation: With patient Time For Goal Achievement: 09/12/19 Potential to Achieve Goals: Poor Progress towards PT goals: Not progressing toward goals - comment    Frequency    Min 2X/week      PT Plan Current plan remains appropriate    Co-evaluation              AM-PAC PT "6 Clicks" Mobility   Outcome Measure  Help needed turning from your back to your side while in a flat bed without using bedrails?: Total Help needed moving from lying on your back to sitting on the side of a flat bed without using bedrails?: Total Help needed moving to and from a bed to a chair (including a wheelchair)?: Total Help needed  standing up from a chair using your arms (e.g., wheelchair or bedside chair)?: Total Help needed to walk in hospital room?: Total Help needed climbing 3-5 steps with a railing? : Total 6 Click Score: 6    End of Session   Activity Tolerance: Patient limited by fatigue;Patient limited by lethargy;Treatment limited secondary to medical complications (Comment) Patient left: in bed;with call bell/phone within reach   PT Visit Diagnosis: Unsteadiness on feet (R26.81);Muscle weakness (generalized) (M62.81);Difficulty in walking, not elsewhere classified (R26.2)     Time: 1010-1036 PT Time Calculation (min) (ACUTE ONLY): 26 min  Charges:  $Therapeutic Activity: 23-37 mins                  Horald Chestnut, PT    Delford Field 09/12/2019, 10:42 AM

## 2019-09-12 NOTE — Progress Notes (Signed)
  Speech Language Pathology Treatment: Dysphagia  Patient Details Name: Roberta Bryant MRN: [id number] DOB: 12/21/1950 Today's Date: 09/12/2019 Time: 1152-1207 SLP Time Calculation (min) (ACUTE ONLY): 15 min  Assessment / Plan / Recommendation Clinical Impression  Lethargy remains the primary obstacle to moving forward with POs.  Pt somnolent initially; required sternal rub and verbal prompting to awaken.  Once alert, she could briefly attend in order to participate in PO trials, then would fall asleep again.  Pt needed max cues to suck from a straw - once she attended, she swallowed several successive sips of water with no s/s of aspiration.  She masticated and swallowed several ice chips with spillage from right side of mouth and physical support needed to keep her head upright and not flexed to right side.   Recommend allowing sips and chips when pt is sufficiently awake and talking.  SLP will continue to follow for readiness to resume a diet and/or participate in instrumental study if warranted.   HPI HPI: Pt is a 69 y.o. 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 Pt s/p EGD and colonoscopy: nonbleeding erosive gastropathy, peptic duodenitis, removal of 6 polyps. Pt continued to have rectal bleeding and is s/p hemicolectomy. Per surgery, pt is okay to start clear liquids if clinically indicated based on swallow function. 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      SLP Plan  Continue with current plan of care       Recommendations  Diet recommendations: (sips of water and ice chips when awake) Medication Administration: Via alternative means                Oral Care Recommendations: Oral care QID Follow  up Recommendations: Other (comment)(tbd) SLP Visit Diagnosis: Dysphagia, oropharyngeal phase (R13.12) Plan: Continue with current plan of care       GO                Roberta Bryant 09/12/2019, 12:10 PM  Jassiel Flye L. Tivis Ringer, [place] Office number [phone] Pager [phone]

## 2019-09-12 NOTE — Progress Notes (Addendum)
Reason for consult:  Acute Encephalopathy  Subjective: Patient is awake and answering questions, not oriented to place or month.  Follows simple commands.   ROS: negative except above  Examination  Vital signs in last 24 hours: Temp:  [97.4 F (36.3 C)-98.2 F (36.8 C)] 98.1 F (36.7 C) (04/06 1306) Pulse Rate:  [100-110] 110 (04/06 1306) Resp:  [20-23] 23 (04/06 0540) BP: (95-126)/(60-68) 95/65 (04/06 1306) SpO2:  [98 %-100 %] 100 % (04/06 1306) Weight:  [90.7 kg] 90.7 kg (04/06 0540)  General: lying in bed CVS: pulse-normal rate and rhythm RS: breathing comfortably Extremities: normal   Neuro: MS: Alert, oriented to herself but not place or month, follows commands CN: pupils equal and reactive,  EOMI, face symmetric, tongue midline, normal sensation over face, Motor: Patient has 3 /5 strength in upper extremities and 2/5 strength in the lower extremities Reflexes:plantars: flexor Coordination: normal Gait: not tested  Basic Metabolic Panel: Recent Labs  Lab 09/07/19 0516 09/07/19 0516 09/08/19 0500 09/08/19 0500 09/09/19 0500 09/09/19 0500 09/10/19 0425 09/11/19 0440 09/12/19 0340  NA 138   < > 138  --  136  --  134* 133* 134*  K 3.6   < > 3.5  --  3.3*  --  4.1 4.9 4.8  CL 99   < > 100  --  99  --  99 99 99  CO2 23   < > 23  --  23  --  24 23 25   GLUCOSE 58*   < > 136*  --  173*  --  159* 194* 192*  BUN 5*   < > 7*  --  8  --  12 22 19   CREATININE 2.67*   < > 3.63*  --  3.10*  --  3.89* 4.78* 3.32*  CALCIUM 7.2*   < > 7.3*   < > 7.0*   < > 7.2* 7.4* 7.5*  MG  --   --   --   --  1.5*  --  1.9 2.0 1.8  PHOS 2.5  --   --   --  1.3*  --  1.8* 1.9* 1.8*   < > = values in this interval not displayed.    CBC: Recent Labs  Lab 09/07/19 0516 09/08/19 0500 09/09/19 0500 09/11/19 0440 09/12/19 0340  WBC 7.8  --  8.3 6.3 6.0  NEUTROABS  --   --  7.1 5.1  --   HGB 9.6* 9.5* 9.8* 9.6* 9.0*  HCT 28.4* 28.7* 30.3* 29.7* 27.9*  MCV 92.2  --  96.5 99.7 101.5*   PLT 129*  --  98* 61* 63*     Coagulation Studies: Recent Labs    09/10/19 0425  LABPROT 14.5  INR 1.1    Imaging Reviewed:   MRI brain: Shows small punctate infarct in the right centrum semiovale likely incidental MRA of the head: Limited due to motion, no obvious high-grade stenosis.  Occluded hypoplastic right vertebral artery  Carotid Dopplers:   EEG: Abnormal EEG due to generalized background slowing with occasional triphasic waves consistent with toxic metabolic encephalopathy.   ASSESSMENT AND PLAN  69 year old female with past medical history of diabetes mellitus, end-stage renal disease on hemodialysis, hypertension, CVA admitted on 08/20/2019 for GI bleeding altered mental status.  Acute toxic metabolic encephalopathy Punctate right central semiovale infarct-incidental finding Generalized weakness in the setting of chronic deconditioning Delirium   Recommendations -Defer aspirin due to recent bleeding -LDL less than 70, no statin required -PT  OT evaluation -Continue thiamine 100 mg p.o., follow B1 levels -Ordered TSH B12 , folate and MMA levels  -continue to hold narcotics/sedating medications    Konstantin Lehnen [facility] Pager Number [phone] For questions after 7pm please refer to AMION to reach the Neurologist on call

## 2019-09-12 NOTE — Progress Notes (Signed)
Patient ID: Roberta Bryant, female   DOB: 15-Sep-1950, 69 y.o.   MRN: [id number]   This NP visited patient at the bedside as a follow up for palliative medicine needs and emotional support.  Patient is lethargic and speech is garbled.  Patient does not have medical decisional capacity at this time  I was able to speak to patient's son [name] by telephone for conversation regarding current medical situation; diagnosis, prognosis, goals of care and end-of-life wishes were discussed.  We discussed the natural trajectory of end-stage renal disease pacifically when a person is dependent on dialysis.  We discussed his mother's overall continued decline and failure to thrive over this hospitalization in spite of aggressive medical interventions.  Educated family on the patient's high risk to decompensate.  Plan is to meet tomorrow in person at the bedside with the patient and her 2 sons.  Family face treatment option decisions, advanced directive decisions and anticipatory care needs.  Encouraged family to consider DNR/DNI status understanding evidenced based poor outcomes in similar hospitalized patients.   [name] tells me my mother absolutely does not want to be a full code, "she has made that perfectly clear to [place] in the past".    I documented DNR/DNI and informed bedside RN of the change.  Questions and concerns addressed   Discussed with Dr Waymon Budge via secure chat  Total time spent on the unit was 25 minutes  Greater than 50% of the time was spent in counseling and coordination of care  Wadie Lessen NP  Palliative Medicine Team Team Phone # [phone] Pager [phone]

## 2019-09-12 NOTE — Progress Notes (Signed)
PHARMACY - TOTAL PARENTERAL NUTRITION CONSULT NOTE   Indication: Prolonged ileus  Patient Measurements: Height: 4' 10" (147.3 cm) Weight: 90.7 kg (199 lb 15.3 oz) IBW/kg (Calculated) : 40.9 TPN AdjBW (KG): 52.3 Body mass index is 41.79 kg/m. Usual Weight: 85kg  Assessment:  69 yof admitted with ABLA in the setting of recurrent diverticular GIB. She required an EGD 3/22 (Nonbleeding erosive gastropathy, peptic duodenitis) and colonoscopy 3/23 (removal of 6 polyps from the right and left colon. Pathology showed tubular adenomas)  found to have a diverticular bleed. Bleeding recurred and now s/p IR embolization.Bleeding scan 3/28 + active bleed RUQ near hepatic flexure - same area of prior embolization.Ongoing bloody stools. S/p 6 units prbcs.. Ultimately underwent a R hemicolectomy 3/30. TPN was needed post-op as bowel function had not returned due to prolonged ileus.   Glucose / Insulin: Synthroid (given some, held some). Hgb A1C 4.9.  CBGs 150 - 180s. 4 units SSI. Electrolytes: K 4.8 (goal >/= 4), Ca 7.5 adjusts to 9.5 (albumin 1.5). Phos 1.8 remained low. Mg 2 (goal >/= 2) Renal: ESRD on HD MWF. Last HD 4/5 LFTs / TGs: Tbili 1.5 LFT WNL Trigs 162 - stable  Prealbumin / albumin: Albumin 1.5, prealbumin 5.3>5.7 (just at goal rate of TPN may need to increase protein later in week) Intake / Output; MIVF: D51/2NS at 10ml/hr. No po intake noted. LBM 3/31. GI Imaging: CT abd 3/25 - Focus of active GI bleeding at hepatic flexure of colon, which demonstrates wall thickening which could be the result of infection, ischemia, or inflammatory bowel disease. Additional thickened small bowel loop in the LEFT mid abdomen question enteritis, associated with dilatation of small bowel loops proximal to this site suggesting a component of obstruction. Question gastric polyps versus redundant mucosa; recommend correlation with prior endoscopy. Small amount of nonspecific perihepatic free fluid. 4/3 Abd  xray - distended gas-filled loops of small bowel - likely ileus  Surgeries / Procedures:  3/22 EGD 3/23 Colonoscopy 3/30 Partial colectomy 4/4 bedside swallow - failed; continue NPO  Central access: CVC  TPN start date: 4/2  Nutritional Goals (per RD recommendation on 4/2): kCal: 1900-2100, Protein: 90-105 Goal TPN rate is 75 mL/hr (provides 100 g of protein and 2028 kcals per day)  Current Nutrition:  Clear liquids :  Boost, Prostat: none charted  Plan:  Continue TPN at goal 75ml/hr - TPN will provide 100g AA, 63g lipids (31% lipids), 293g dextrose (GIF 2.48), 2028 total kcal for 100% needs met Continue standard MVI MWF and trace elements to TPN - remove chromium due to ESRD on HD. (d/c Renal MV while on TPN)  Replace electrolytes cautiously in ESRD pt: Increase Na, Phos slightly Continue SSI q4h but increase dose range 20 mmol NaPhos - discussed with Dr Schertz Monitor TPN labs on Mon/Thurs  Phos in am   M, PharmD, BCPS, BCCCP Clinical Pharmacist [phone]  Please check AMION for all [facility] Pharmacy numbers  09/12/2019 8:42 AM   

## 2019-09-12 NOTE — Progress Notes (Signed)
PROGRESS NOTE    Roberta Bryant  SHF:[id number] DOB: 1950/09/19 DOA: 08/23/2019 PCP: [name], MD   Brief Narrative:  HPI on 08/26/2019 by [name], NP (ICU) 69 y/o F who presented to [facility] on 3/21 with reports of dark stools and altered mental status.    The patient was seen on 3/17 in the ER after she was noted to be altered staring off and not talking during HD. She reported feeling generalized weakness and missed HD on 3/15.  Only completed 50 minutes of HD on 3/17. Her Hgb at that time was 8.8. She reports she had bleeding from her fistula on Friday 3/19 that was brisk and difficult to control.   She returns to the ER on 3/21 with reports of black-red stools, weakness, abdominal pain.  EMS found her to have a BP of 90/50 and treated her with 631ml NS.  BP improved to 116/50.  She was awake/alert on arrival with SBP's remaining in the 90's.  Her last HD session was 3/19.  The patient's initial labs concerning for worsening anemia with Hgb of 5.4 (iSTAT).  She was treated with 2 units blood in the ER.  CXR was notable for bibasilar atelectasis and low lung volumes but no acute process.  She was FOBT positive on presentation with subsequent melanic stools in the ER.  GI, Nephrology consulted.    PCCM consulted for admission.    Interim history Patient admitted with GI bleed, status post EGD and colonoscopy found to have nonbleeding erosive gastropathy, peptic duodenitis, removal of 6 polyps from the right and left colon.  Pathology showed tubular adenomas.  She continued to have rectal bleeding, CTA 08/31/2019 with active bleeding in the hepatic flexure and had embolization done.  She continues to have intermittent rectal bleeding and requires blood transfusions.  Patient did have bleeding scan which is positive for bleeding at the hepatic flexure at the previous embolization site.  General surgery consulted after IR recommendation, status post right hemicolectomy. Continues to  have fluctuating mental status, neurology consulted. Found to have a small CVA on MRI.  Assessment & Plan   Recurrent diverticular GI bleeding/acute blood loss anemia/anemia of chronic kidney disease -Gastroenterology consult appreciated -Status post EGD 3/22: Nonbleeding erosive gastropathy, peptic duodenitis -Status post colonoscopy 3/23: 6 polyps removed from the right and left colon, left colon diverticulosis, no stigmata of bleeding.  Pathology shows tubular adenomas -Status post CTA which showed active bleeding at the hepatic flexure, underwent arteriogram and embolization on 08/31/2019 -Continued to have bleeding, NM bleeding scan obtained on 09/02/2019 which showed hepatic flexure bleeding again. 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 -Improved  End-stage renal disease -Patient dialyzes Monday, Wednesday, Friday -Nephrology consulted and appreciated -HD today  LUE aVF pulsatile with LUE larger than RUE ?CV Stenosis -Per nephrology, patient will need fistulogram once stable  Thrombocytopenia -unknown cause -continue to monitor CBC  Elevated INR -Present on admission, given vitamin K daily, now down to 1.1 -Continue to monitor  Essential hypertension -BP has been soft, requiring patient to receive several IV fluid boluses as well as blood  transfusion -Continue to monitor closely  Anxiety/depression -Was on Klonopin  Metabolic bone disease -Continue calcitriol  Diabetes mellitus, type  II -Hemoglobin A1c was 4.9 (08/10/2019)  -Januvia currently held -Hypoglycemia has resolved, will discontinue D5 half-normal saline -Placed on insulin sliding scale with CBG monitoring  Hypothyroidism -Continue Synthroid  Coronary artery disease -Patient follows with cardiologist at [facility] stress test was in 2019 showing an EF of 52%, low risk study -Currently no complaints of chest pain -Aspirin held due to GI bleed -Coreg held due to soft BP  Intermittent mild confusion/acute metabolic encephalopathy -Continues to have intermittent confusion -Question whether this is due to GI bleeding versus other cause -Continue to monitor closely -CT head: no evidence of acute intracranial abnormality. Chronic infarcts L parietal lobe, left basal ganglia, adjacent white matter  -Chest x-ray unremarkable for infection -ABG within normal limits, mild hypoxia -Ammonia level 33 -Discussed with son via phone, states patient has had intermittent confusion hallucinations prior to admission. Mental status worsened some after COVID infection in February 2021. Has been seeing Dr. Jannifer Franklin, neurology. Was diagnosed with Parkinsons, but states this was an incorrect diagnosis. Also states patient has had lower extremity weakness and gait abnormalities. -?underlying dementia -have reviewed outpatient neurology notes, no mention of mental/memory impairment or AMS -Neurology consulted and appreciated- started on high dose thiamine  -MRI as below, doubt this is the cause of patient's acute metabolic encephalopathy and intermittent confusion -EEG: Normal due to general background slowing with occasional triphasic waves.  Findings consistent with toxic/metabolic encephalopathy.  No epileptiform activity.  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  Trivial mitral valve regurg.  Mid LV cavitary obliteration, suspect degree of left ear likely moderate.  Mild aortic valve regurgitation. -Speech following -PT and OT of already assessed the patient and recommended SNF -Given GI bleed, holding off on any type of antiplatelet at this time -Lipid panel: Total cholesterol 60, HDL less than 10, LDL not calculated, triglycerides 163 -Hemoglobin A1c 4.9  Goals of care -Palliative care consulted and appreciated  Deconditioning -Continue PT/OT-recommended SNF  Poor oral intake -Has had inadequate oral intake, likely due to GI function -Will continue to monitor -Nutrition consulted, continue supplements -Speech therapy consulted -Continue TPN   Abdominal pain/Ileus -Abdominal x-ray on 09/09/2019 shows ileus -Abdominal x-ray on 09/11/2019 showed stable dilated small bowel loops, concerning for distal small bowel obstruction or possible ileus -General surgery aware and following -Continue conservative management, currently n.p.o.  Fever/tachycardia-SIRS -Continues to spike fevers, up to 100.9 F.  Continues to have tachycardia and tachypnea -Blood cultures pending -Chest x-ray shows atelectasis -will add on incentive spirometry    DVT Prophylaxis  SCDs  Code Status: Full  Family Communication: None at bedside. Son via phone  Disposition Plan: Admitted from home for GI bleeding and anemia. S/p R hemicolectomy on 3/30. Pending further surgical recommendations, Currently on TPN. Pending improvement in mental status. Suspect SNF when stable.   Consultants Neurosurgery Nephrology Gastroenterology Interventional radiology PCCM Neurology  Procedures  09/05/2019 EGD: Afew gastric polyps biopsied: hyperplastic polyp with eroded granulation tissue and surface reactive change. Mild, nonbleeding, erosive gastropathy biopsied:Mild reactive gastropathy  with some surface hyperplastic change, no H. Pylori.Duodenal nodule biopsied:peptic duodenitis.   08/22/2019 colonoscopy: 6 polyps removed located in right and left colon.  Left colon diverticulosis. Solitary rectal ulcer, no stigmata of bleeding. Pathology shows tubular adenomas,noHGD. Ulcer not biopsied.   08/31/2019: IR arteriogram with bleeding in hepatic flexure, status post embolization  08/14/2019: Right hemicolectomy  Antibiotics   Anti-infectives (From admission, onward)   Start     Dose/Rate Route Frequency Ordered Stop   08/21/2019 1015  cefoTEtan (CEFOTAN) 2 g in sodium chloride 0.9 % 100 mL IVPB     2 g 200 mL/hr over 30 Minutes Intravenous  Once 09/04/2019 1002 09/04/2019 1600      Subjective:   Roberta Bryant seen and examined today.  Denies pain this morning, abdominal pain, nausea or vomiting.  Has not had a bowel movement.  Denies chest pain or shortness of breath. Objective:   Vitals:   09/11/19 1200 09/11/19 1206 09/11/19 1939 09/12/19 0540  BP: 120/67 (!) 143/74 120/68 126/60  Pulse: 98 (!) 102 100 (!) 101  Resp:  (!) 23 20 (!) 23  Temp:  99.2 F (37.3 C) 98.2 F (36.8 C) (!) 97.4 F (36.3 C)  TempSrc:  Oral Oral Oral  SpO2:  99%  98%  Weight:  89.2 kg  90.7 kg  Height:        Intake/Output Summary (Last 24 hours) at 09/12/2019 1147 Last data filed at 09/11/2019 1206 Gross per 24 hour  Intake --  Output 721 ml  Net -721 ml   Filed Weights   09/11/19 0840 09/11/19 1206 09/12/19 0540  Weight: 81.5 kg 89.2 kg 90.7 kg   Exam  General: Well developed, chronically ill-appearing, NAD  HEENT: NCAT,mucous membranes moist.   Cardiovascular: S1 S2 auscultated, SEM, RRR  Respiratory: Diminished breath sounds  Abdomen: Soft, obrdr, nontender, nondistended, + bowel sounds, midline incision  Extremities: warm dry without cyanosis clubbing or edema  Psych: Normal affect and demeanor with intact judgement and insight  Data Reviewed: I have personally  reviewed following labs and imaging studies  CBC: Recent Labs  Lab 09/07/19 0516 09/08/19 0500 09/09/19 0500 09/11/19 0440 09/12/19 0340  WBC 7.8  --  8.3 6.3 6.0  NEUTROABS  --   --  7.1 5.1  --   HGB 9.6* 9.5* 9.8* 9.6* 9.0*  HCT 28.4* 28.7* 30.3* 29.7* 27.9*  MCV 92.2  --  96.5 99.7 101.5*  PLT 129*  --  98* 61* 63*   Basic Metabolic Panel: Recent Labs  Lab 09/07/19 0516 09/07/19 0516 09/08/19 0500 09/09/19 0500 09/10/19 0425 09/11/19 0440 09/12/19 0340  NA 138   < > 138 136 134* 133* 134*  K 3.6   < > 3.5 3.3* 4.1 4.9 4.8  CL 99   < > 100 99 99 99 99  CO2 23   < > 23 23 24 23 25   GLUCOSE 58*   < > 136* 173* 159* 194* 192*  BUN 5*   < > 7* 8 12 22 19   CREATININE 2.67*   < > 3.63* 3.10* 3.89* 4.78* 3.32*  CALCIUM 7.2*   < > 7.3* 7.0* 7.2* 7.4* 7.5*  MG  --   --   --  1.5* 1.9 2.0 1.8  PHOS 2.5  --   --  1.3* 1.8* 1.9* 1.8*   < > = values in this interval not displayed.   GFR: Estimated Creatinine Clearance: 15.4 mL/min (A) (by C-G formula based on SCr of 3.32 mg/dL (H)). Liver Function Tests: Recent Labs  Lab 08/19/2019 1833 09/06/19 1779 09/08/19 0500 09/09/19 0500 09/10/19 0425 09/11/19 0440 09/12/19 0340  AST 41  --  125* 113*  --  61* 48*  ALT 22  --  39 39  --  29 25  ALKPHOS 52  --  85 98  --  69 57  BILITOT 1.4*  --  1.5* 1.3*  --  1.5* 1.8*  PROT 4.3*  --  3.8* 4.2*  --  4.3* 4.5*  ALBUMIN 2.2*   < > 1.6* 1.5* 1.3* 1.2* 1.5*   < > = values in this interval not displayed.   No results for input(s): LIPASE, AMYLASE in the last 168 hours. Recent Labs  Lab 09/07/19 1223  AMMONIA 33   Coagulation Profile: Recent Labs  Lab 08/17/2019 1833 09/06/19 0632 09/07/19 0516 09/10/19 0425  INR 1.6* 1.4* 1.5* 1.1   Cardiac Enzymes: No results for input(s): CKTOTAL, CKMB, CKMBINDEX, TROPONINI in the last 168 hours. BNP (last 3 results) No results for input(s): PROBNP in the last 8760 hours. HbA1C: No results for input(s): HGBA1C in the last 72  hours. CBG: Recent Labs  Lab 09/11/19 2055 09/11/19 2357 09/12/19 0537 09/12/19 0822 09/12/19 1124  GLUCAP 177* 172* 182* 167* 175*   Lipid Profile: Recent Labs    09/10/19 1059 09/11/19 0441  CHOL 60  --   HDL <10*  --   LDLCALC NOT CALCULATED  --   TRIG 163* 162*  CHOLHDL NOT CALCULATED  --    Thyroid Function Tests: No results for input(s): TSH, T4TOTAL, FREET4, T3FREE, THYROIDAB in the last 72 hours. Anemia Panel: No results for input(s): VITAMINB12, FOLATE, FERRITIN, TIBC, IRON, RETICCTPCT in the last 72 hours. Urine analysis:    Component Value Date/Time   COLORURINE YELLOW 01/04/2018 1524   APPEARANCEUR CLEAR 01/04/2018 1524   LABSPEC 1.010 01/04/2018 1524   PHURINE 8.5 (H) 01/04/2018 1524   GLUCOSEU NEGATIVE 01/04/2018 1524   HGBUR NEGATIVE 01/04/2018 1524   [place] 01/04/2018 1524   KETONESUR NEGATIVE 01/04/2018 1524   PROTEINUR 100 (A) 01/04/2018 1524   UROBILINOGEN 0.2 10/30/2014 1801   NITRITE NEGATIVE 01/04/2018 1524   LEUKOCYTESUR SMALL (A) 01/04/2018 1524   Sepsis Labs: @LABRCNTIP (procalcitonin:4,lacticidven:4)  ) Recent Results (from the past 240 hour(s))  Culture, blood (routine x 2)     Status: None (Preliminary result)   Collection Time: 09/11/19  2:58 PM   Specimen: BLOOD  Result Value Ref Range Status   Specimen Description BLOOD RIGHT FOOT  Final   Special Requests AEROBIC BOTTLE ONLY Blood Culture adequate volume  Final   Culture   Final    NO GROWTH < 24 HOURS Performed at [facility], [place] [address] [place], [place] [address]    Report Status PENDING  Incomplete      Radiology Studies: EEG  Result Date: 09/10/2019 Alexis Goodell, MD     09/10/2019  2:09 PM ELECTROENCEPHALOGRAM REPORT Patient: CHRYSTAL ZEIMET       Room #: 2G31D EEG No. ID: [id number] Age: 69 y.o.        Sex: female Requesting Physician: Ivis Henneman Report Date:  09/10/2019       Interpreting Physician: Alexis Goodell History: MATSUKO KRETZ  is an 69 y.o. female with altered mental status Medications: Insulin, Colace, Synthroid, Thiamine Conditions of Recording:  This is a 21 channel routine scalp EEG performed with bipolar and monopolar montages arranged in accordance to the international 10/20 system of electrode placement. One channel was dedicated to EKG recording. The patient is in the poorly responsive state. Description:  The background activity  is slow and poorly organized.  It consists of a low voltage polymorphic delta activity that is continuous and diffusely distributed.  Also noted are occasional intermittent periodic discharges of triphasic morphology There is no activation of the background noted with stimulation. No epileptiform activity is noted.  Hyperventilation and intermittent photic stimulation were not performed. IMPRESSION: This is an abnormal electroencephalogram due general background slowing with occasional triphasic waves.  These findings are consistent with a toxic/metabolic encephalopathy.  No epileptiform activity is noted.  Alexis Goodell, MD Neurology [phone] 09/10/2019, 2:02 PM   DG Abd 1 View  Result Date: 09/11/2019 CLINICAL DATA:  Gastrointestinal bleeding. EXAM: ABDOMEN - 1 VIEW COMPARISON:  September 09, 2019. FINDINGS: Stable dilated small bowel loops are noted concerning for distal small bowel obstruction or possibly ileus. Midline surgical staples are noted. Status post cholecystectomy. No definite colonic dilatation is noted. IMPRESSION: Stable dilated small bowel loops are noted concerning for distal small bowel obstruction or possibly ileus. Electronically Signed   By: Marijo Conception M.D.   On: 09/11/2019 10:51   MR ANGIO HEAD WO CONTRAST  Result Date: 09/11/2019 CLINICAL DATA:  Follow-up examination for acute stroke. Altered mental status. EXAM: MRA HEAD WITHOUT CONTRAST TECHNIQUE: Angiographic images of the Circle of Willis were obtained using MRA technique without intravenous contrast. COMPARISON:   Prior MRI from 09/09/2019 as well as prior CTA from 03/29/2019. FINDINGS: ANTERIOR CIRCULATION: Examination severely degraded by motion artifact, markedly limiting assessment. Visualized distal cervical segments of the internal carotid arteries are grossly patent with antegrade flow. Petrous segments widely patent bilaterally. Cavernous/supraclinoid ICAs grossly patent, although evaluation markedly limited by motion artifact. No obvious high-grade stenosis. A1 segments patent bilaterally. Grossly negative anterior communicating artery complex. Anterior cerebral arteries grossly perfused to their distal aspects without obvious stenosis. Neither M1 segment well assessed due to motion. Distal MCA branches perfused and grossly symmetric. Neither MCA bifurcation well evaluated. POSTERIOR CIRCULATION: Dominant left vertebral artery patent to the vertebrobasilar junction without appreciable stenosis. Left PICA not seen. 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 Right PICA not seen. Basilar patent to its distal aspect without obvious stenosis. Superior cerebral arteries patent at their origins. Left PCA supplied primarily via the basilar. Right PCA supplied via the basilar as well as a small right posterior communicating artery. Both PCAs appear grossly perfused to their distal aspects on time-of-flight sequence. Evaluation for possible aneurysm or other vascular abnormality severely limited on this motion degraded exam. IMPRESSION: 1. Markedly limited exam due to severe motion artifact. 2. Grossly stable appearance of the intracranial circulation, with no new large vessel occlusion. No other definite new or progressive findings identified on this limited exam. 3. Occluded hypoplastic right vertebral artery, also seen on prior CTA from 03/29/2019. Electronically Signed   By: Jeannine Boga M.D.   On: 09/11/2019 19:01   DG CHEST PORT 1 VIEW  Addendum Date: 09/11/2019   ADDENDUM  REPORT: 09/11/2019 11:08 ADDENDUM: Findings were discussed with Dr. Jonnie Finner who indicated that the patient is s/p recent right hemicolectomy for GI bleeding. Indeed, patient is status post right hemicolectomy on 09/01/2019. Although no gas was seen on the portable chest x-ray of 09/07/2019, the patient had a supine abdomen on 09/09/2019 that did demonstrate a Rigler's sign, consistent with the presence of intraperitoneal free. Gas is not unexpected 7-10 days after surgery. Electronically Signed   By: Misty Stanley M.D.   On: 09/11/2019 11:08   Result Date: 09/11/2019 CLINICAL DATA:  GI  bleed.  CHF. EXAM: PORTABLE CHEST 1 VIEW COMPARISON:  09/07/2019 FINDINGS: 1036 hours. Low lung volumes. Cardiopericardial silhouette is at upper limits of normal for size. No overt pulmonary edema with streaky opacity in the right mid lung and left base suggesting atelectasis. Right IJ central line tip overlies the mid SVC level. Lucency identified under both hemidiaphragms, consistent with intraperitoneal free air. Probable Rigler sign left abdomen, also suggesting intraperitoneal free air. Status post right shoulder replacement. Telemetry leads overlie the chest. IMPRESSION: 1. Intraperitoneal free air without reported history of recent surgery. Bowel perforation a major concern. 2. Low lung volumes with borderline cardiomegaly and streaky bilateral airspace opacities suggesting atelectasis. Electronically Signed: By: Misty Stanley M.D. On: 09/11/2019 10:56   ECHOCARDIOGRAM COMPLETE  Result Date: 09/10/2019    ECHOCARDIOGRAM REPORT   Patient Name:   SHARIYA GASTER Date of Exam: 09/10/2019 Medical Rec #:  [id number]          Height:       58.0 in Accession #:    [id number]         Weight:       180.8 lb Date of Birth:  1950/08/13          BSA:          1.745 m Patient Age:    8 years           BP:           89/59 mmHg Patient Gender: F                  HR:           105 bpm. Exam Location:  Inpatient Procedure: 2D Echo,  Cardiac Doppler and Color Doppler Indications:    Stroke 434.91/I163.0  History:        Patient has prior history of Echocardiogram examinations, most                 recent 12/28/2017. Risk Factors:Hypertension and Diabetes. ESRD.  Sonographer:    Clayton Lefort RDCS (AE) Referring Phys: [id number] [facility]  Sonographer Comments: Technically difficult study due to poor echo windows, suboptimal parasternal window, no subcostal window and patient is morbidly obese. Limited patient mobility. IMPRESSIONS  1. Left ventricular ejection fraction, by estimation, is 65 to 70%. The left ventricle has normal function. The left ventricle has no regional wall motion abnormalities. There is mild left ventricular hypertrophy. Left ventricular diastolic parameters are consistent with Grade I diastolic dysfunction (impaired relaxation).  2. Right ventricular systolic function is hyperdynamic. The right ventricular size is normal.  3. Left atrial size was mildly dilated.  4. The mitral valve is abnormal. Trivial mitral valve regurgitation. The mean mitral valve gradient is 4.0 mmHg with average heart rate of 105 bpm.  5. There appears to be mid-LV cavitary obliteration, incresing the doppler gradient across the LVOT, so I suspect the degree of AS is overestimated. That being said, there is probably moderate AS with a calcified valve of indetermine cusps. The aortic valve has an indeterminant number of cusps. Aortic valve regurgitation is mild. Moderate aortic valve stenosis. Aortic regurgitation PHT measures 366 msec. Aortic valve area, by VTI measures 0.95 cm. Aortic valve mean gradient measures 19.2 mmHg. Aortic  valve Vmax measures 3.06 m/s. FINDINGS  Left Ventricle: Left ventricular ejection fraction, by estimation, is 65 to 70%. The left ventricle has normal function. The left ventricle has no regional wall motion abnormalities. The left ventricular internal cavity size was  normal in size. There is  mild left ventricular  hypertrophy. Left ventricular diastolic parameters are consistent with Grade I diastolic dysfunction (impaired relaxation). Indeterminate filling pressures. Right Ventricle: The right ventricular size is normal. No increase in right ventricular wall thickness. Right ventricular systolic function is hyperdynamic. Left Atrium: Left atrial size was mildly dilated. Right Atrium: Right atrial size was normal in size. Pericardium: There is no evidence of pericardial effusion. Mitral Valve: The mitral valve is abnormal. There is mild calcification of the mitral valve leaflet(s). Mild to moderate mitral annular calcification. Trivial mitral valve regurgitation. MV peak gradient, 9.5 mmHg. The mean mitral valve gradient is 4.0 mmHg with average heart rate of 105 bpm. Tricuspid Valve: The tricuspid valve is grossly normal. Tricuspid valve regurgitation is trivial. Aortic Valve: There appears to be mid-LV cavitary obliteration, incresing the doppler gradient across the LVOT, so I suspect the degree of AS is overestimated. That being said, there is probably moderate AS with a calcified valve of indetermine cusps. The aortic valve has an indeterminant number of cusps. Aortic valve regurgitation is mild. Aortic regurgitation PHT measures 366 msec. Moderate aortic stenosis is present. Aortic valve mean gradient measures 19.2 mmHg. Aortic valve peak gradient measures  37.4 mmHg. Aortic valve area, by VTI measures 0.95 cm. Pulmonic Valve: The pulmonic valve was grossly normal. Pulmonic valve regurgitation is not visualized. Aorta: The aortic root and ascending aorta are structurally normal, with no evidence of dilitation. IAS/Shunts: No atrial level shunt detected by color flow Doppler.  LEFT VENTRICLE PLAX 2D LVOT diam:     2.20 cm  Diastology LV SV:         46       LV e' lateral: 8.81 cm/s LV SV Index:   27 LVOT Area:     3.80 cm  RIGHT VENTRICLE RV Basal diam:  2.70 cm RV S prime:     8.16 cm/s TAPSE (M-mode): 1.9 cm LEFT  ATRIUM             Index       RIGHT ATRIUM          Index LA Vol (A2C):   59.8 ml 34.28 ml/m RA Area:     9.32 cm LA Vol (A4C):   47.3 ml 27.11 ml/m RA Volume:   16.50 ml 9.46 ml/m LA Biplane Vol: 53.7 ml 30.78 ml/m  AORTIC VALVE AV Area (Vmax):    1.68 cm AV Area (Vmean):   0.91 cm AV Area (VTI):     0.95 cm AV Vmax:           305.75 cm/s AV Vmean:          200.250 cm/s AV VTI:            0.487 m AV Peak Grad:      37.4 mmHg AV Mean Grad:      19.2 mmHg LVOT Vmax:         135.00 cm/s LVOT Vmean:        47.800 cm/s LVOT VTI:          0.122 m LVOT/AV VTI ratio: 0.25 AI PHT:            366 msec MITRAL VALVE            TRICUSPID VALVE MV Peak grad: 9.5 mmHg  TR Peak grad:   22.7 mmHg MV Mean grad: 4.0 mmHg  TR Vmax:        238.00 cm/s MV Vmax:  1.54 m/s MV Vmean:     82.6 cm/s SHUNTS                         Systemic VTI:  0.12 m                         Systemic Diam: 2.20 cm Lyman Bishop MD Electronically signed by Lyman Bishop MD Signature Date/Time: 09/10/2019/3:44:40 PM    Final    VAS US CAROTID  Result Date: 09/11/2019 Carotid Arterial Duplex Study Indications:       CVA and Recent Covid-19. Risk Factors:      Hypertension. Limitations        Today's exam was limited due to a central line, bandages,                    patient with altered mental status and the body habitus of                    the patient. Comparison Study:  Prior study from 09/11/2016 is available for comparison Performing Technologist: Sharion Dove RVS  Examination Guidelines: A complete evaluation includes B-mode imaging, spectral Doppler, color Doppler, and power Doppler as needed of all accessible portions of each vessel. Bilateral testing is considered an integral part of a complete examination. Limited examinations for reoccurring indications may be performed as noted.  Right Carotid Findings: Unable to insonate secondary to line/bandages Left Carotid Findings:  +----------+--------+--------+--------+------------------+--------+           PSV cm/sEDV cm/sStenosisPlaque DescriptionComments +----------+--------+--------+--------+------------------+--------+ CCA Prox  33      12              heterogenous               +----------+--------+--------+--------+------------------+--------+ CCA Distal30      9               heterogenous               +----------+--------+--------+--------+------------------+--------+ ICA Prox  45      18              heterogenous               +----------+--------+--------+--------+------------------+--------+ ICA Distal65      24                                         +----------+--------+--------+--------+------------------+--------+ ECA       33      1                                          +----------+--------+--------+--------+------------------+--------+ +----------+--------+--------+--------+-------------------+           PSV cm/sEDV cm/sDescribeArm Pressure (mmHG) +----------+--------+--------+--------+-------------------+ HENIDPOEUM35                                          +----------+--------+--------+--------+-------------------+ +---------+--------+--+--------+--+---------+ VertebralPSV cm/s37EDV cm/s12Antegrade +---------+--------+--+--------+--+---------+   Summary:  Left Carotid: The extracranial vessels were near-normal with only minimal wall               thickening or plaque. Vertebrals:  Left vertebral  artery demonstrates antegrade flow. Subclavians: Normal flow hemodynamics were seen in the left subclavian artery. *See table(s) above for measurements and observations.     Preliminary      Scheduled Meds: . acetaminophen  650 mg Oral Q6H  . calcitRIOL  1 mcg Oral Q M,W,F-HD  . Chlorhexidine Gluconate Cloth  6 each Topical Q0600  . darbepoetin (ARANESP) injection - DIALYSIS  100 mcg Intravenous Q Wed-HD  . docusate sodium  100 mg Oral BID  . feeding  supplement  1 Container Oral TID BM  . feeding supplement (PRO-STAT SUGAR FREE 64)  30 mL Oral TID  . insulin aspart  0-9 Units Subcutaneous Q4H  . levothyroxine  25 mcg Intravenous Daily  . pantoprazole  40 mg Oral Q0600  . sodium chloride flush  10-40 mL Intracatheter Q12H   Continuous Infusions: . sodium chloride Stopped (09/07/19 1550)  . sodium chloride    . sodium chloride 10 mL/hr at 09/11/19 1544  . sodium phosphate  Dextrose 5% IVPB    . TPN ADULT (ION) 75 mL/hr at 09/11/19 1821  . TPN ADULT (ION)       LOS: 16 days   Time Spent in minutes   45 minutes  Wolf Boulay D.O. on 09/12/2019 at 11:47 AM  Between 7am to 7pm - Please see pager noted on [email or web address]  After 7pm go to [email or web address]  And look for the night coverage person covering for me after hours  [facility] Office  [phone]

## 2019-09-13 ENCOUNTER — Telehealth: Payer: Self-pay | Admitting: Neurology

## 2019-09-13 DIAGNOSIS — Z66 Do not resuscitate: Secondary | ICD-10-CM

## 2019-09-13 LAB — BASIC METABOLIC PANEL
Anion gap: 12 (ref 5–15)
BUN: 40 mg/dL — ABNORMAL HIGH (ref 8–23)
CO2: 23 mmol/L (ref 22–32)
Calcium: 7.8 mg/dL — ABNORMAL LOW (ref 8.9–10.3)
Chloride: 99 mmol/L (ref 98–111)
Creatinine, Ser: 4.08 mg/dL — ABNORMAL HIGH (ref 0.44–1.00)
GFR calc Af Amer: 12 mL/min — ABNORMAL LOW (ref >60)
GFR calc non Af Amer: 10 mL/min — ABNORMAL LOW (ref >60)
Glucose, Bld: 203 mg/dL — ABNORMAL HIGH (ref 70–99)
Potassium: 5.2 mmol/L — ABNORMAL HIGH (ref 3.5–5.1)
Sodium: 134 mmol/L — ABNORMAL LOW (ref 135–145)

## 2019-09-13 LAB — CBC
HCT: 28.4 % — ABNORMAL LOW (ref 36.0–46.0)
Hemoglobin: 9.2 g/dL — ABNORMAL LOW (ref 12.0–15.0)
MCH: 33.3 pg (ref 26.0–34.0)
MCHC: 32.4 g/dL (ref 30.0–36.0)
MCV: 102.9 fL — ABNORMAL HIGH (ref 80.0–100.0)
Platelets: 94 10*3/uL — ABNORMAL LOW (ref 150–400)
RBC: 2.76 MIL/uL — ABNORMAL LOW (ref 3.87–5.11)
RDW: 24.7 % — ABNORMAL HIGH (ref 11.5–15.5)
WBC: 7.6 10*3/uL (ref 4.0–10.5)
nRBC: 2.9 % — ABNORMAL HIGH (ref 0.0–0.2)

## 2019-09-13 LAB — GLUCOSE, CAPILLARY
Glucose-Capillary: 175 mg/dL — ABNORMAL HIGH (ref 70–99)
Glucose-Capillary: 189 mg/dL — ABNORMAL HIGH (ref 70–99)
Glucose-Capillary: 195 mg/dL — ABNORMAL HIGH (ref 70–99)

## 2019-09-13 LAB — FOLATE RBC
Folate, Hemolysate: 399 ng/mL
Folate, RBC: 1478 ng/mL (ref >498)
Hematocrit: 27 % — ABNORMAL LOW (ref 34.0–46.6)

## 2019-09-13 LAB — PHOSPHORUS: Phosphorus: 4.1 mg/dL (ref 2.5–4.6)

## 2019-09-13 MED ORDER — LACTATED RINGERS IV BOLUS: 1000.0000 mL | Freq: Once | INTRAVENOUS | Status: DC

## 2019-09-15 LAB — VITAMIN B1: Vitamin B1 (Thiamine): 226.8 nmol/L — ABNORMAL HIGH (ref 66.5–200.0)

## 2019-09-16 ENCOUNTER — Other Ambulatory Visit (HOSPITAL_COMMUNITY): Payer: Medicare Other

## 2019-09-16 LAB — CULTURE, BLOOD (ROUTINE X 2)
Culture: NO GROWTH
Culture: NO GROWTH
Special Requests: ADEQUATE
Special Requests: ADEQUATE

## 2019-09-20 LAB — METHYLMALONIC ACID, SERUM: Methylmalonic Acid, Quantitative: 495 nmol/L — ABNORMAL HIGH (ref 0–378)

## 2019-10-07 NOTE — Progress Notes (Signed)
0645: Monitor alarming in room d/t low BP.  This RN in to check on patient.  BP 60s-70s/30s-40s.  Patient briefly opened eyes after calling her name several times.  Pt minimally responsive and agonal breathing with O2 sats 89% on room air.  Tylene Fantasia, NP paged.  Rapid response notified.  IV fluid bolus started.  At 0657, patient went apneic and pulseless.  Rapid response nurse, Leslye Peer and Tylene Fantasia, NP both at bedside.  Death pronounced by 2 RNs at [phone].  Patient's son, [place], notified and states he will be here ASAP.  Report given to Sam, RN.  Jodell Cipro

## 2019-10-07 NOTE — Telephone Encounter (Signed)
Pt's son Sharilyn Sites called to inform the office that the pt has passed away.

## 2019-10-07 NOTE — Death Summary Note (Addendum)
DEATH SUMMARY   Patient Details  Name: Roberta Bryant MRN: [id number] DOB: 1950-12-04  Admission/Discharge Information   Admit Date:  09/06/2019  Date of Death: Date of Death: September 15, 2019  Time of Death: Time of Death: 0657  Length of Stay: 2022/08/25  Referring Physician: [name], MD   Reason(s) for Hospitalization  Recurrent diverticular GI bleeding/acute blood loss anemia/anemia of chronic kidney disease  Diagnoses  Preliminary cause of death: Acute hypoxic respiratory failure and hypotension Secondary Diagnoses (including complications and co-morbidities):  Recurrent diverticular GI bleeding/acute blood loss anemia/anemia of chronic kidney disease Bleeding from dialysis fistula End-stage renal disease LUE aVF pulsatile with LUE larger than RUE ?CV Stenosis Thrombocytopenia Elevated INR Essential hypertension Anxiety/depression Metabolic bone disease Diabetes mellitus, type II Hypothyroidism Coronary artery disease Intermittent mild confusion/acute metabolic encephalopathy Acute CVA Goals of care Deconditioning Poor oral intake Abdominal pain/Ileus Fever/tachycardia-SIRS Chronic diastolic CHF   Brief Hospital Course (including significant findings, care, treatment, and services provided and events leading to death)  HPI on 08/30/2019 by [name], NP (ICU) 69 y/o F who presented to [facility] on August 29, 2022 with reports of dark stools and altered mental status.   The patient was seen on 2022/08/25 in the ER after she was noted to be altered staring off and not talking during HD. She reported feeling generalized weakness and missed HD on 3/15. Only completed 50 minutes of HD on August 25, 2022. Her Hgb at that time was 8.8. She reports she had bleeding from her fistula on Friday 3/19 that was brisk and difficult to control.   She returns to the ER on 08-29-2022 with reports of black-red stools, weakness, abdominal pain. EMS found her to have a BP of 90/50 and treated her with 645ml NS. BP  improved to 116/50. She was awake/alert on arrival with SBP's remaining in the 90's. Her last HD session was 3/19. The patient's initial labs concerning for worsening anemia with Hgb of 5.4 (iSTAT). She was treated with 2 units blood in the ER. CXR was notable for bibasilar atelectasis and low lung volumes but no acute process. She was FOBT positive on presentation with subsequent melanic stools in the ER. GI, Nephrology consulted.   PCCM consulted for admission.  Interim history Patient admitted with GI bleed, status post EGD and colonoscopy found to have nonbleeding erosive gastropathy, peptic duodenitis, removal of 6 polyps from the right and left colon.  Pathology showed tubular adenomas.  She continued to have rectal bleeding, CTA 08/31/2019 with active bleeding in the hepatic flexure and had embolization done.  She continues to have intermittent rectal bleeding and requires blood transfusions.  Patient did have bleeding scan which is positive for bleeding at the hepatic flexure at the previous embolization site.  General surgery consulted after IR recommendation, status post right hemicolectomy. Continues to have fluctuating mental status, neurology consulted. Found to have a small CVA on MRI. Patient continued to decline and mental status did not improve. Palliative care consulted and appreciated. Patient was made DNR. She began to decompensate and became hypotensive and hypoxic. Patient later passed 0657 on 09-15-19.   Recurrent diverticular GI bleeding/acute blood loss anemia/anemia of chronic kidney disease -Gastroenterology consult appreciated -Status post EGD 3/22: Nonbleeding erosive gastropathy, peptic duodenitis -Status post colonoscopy 2022-08-31: 6 polyps removed from the right and left colon, left colon diverticulosis, no stigmata of bleeding.  Pathology shows tubular adenomas -Status post CTA which showed active bleeding at the hepatic flexure, underwent arteriogram and  embolization on 08/31/2019 -Continued to  have bleeding, NM bleeding scan obtained on 09/02/2019 which showed hepatic flexure bleeding again. -Interventional radiology, and general surgery consulted and appreciated -Interventional radiology feels that other attempted embolizing could lead to ischemic bowel/necrosis which could necessitate emergent colectomy -General surgery on 08/18/1967 performed right hemicolectomy -Was admitted with hemoglobin of 4.9 and has received 5 units of PRBC to date -hemoglobin 9 -Was on Aranesp -Anemia panel showed adequate iron storage on 08/09/2019   Bleeding from dialysis fistula -Improved  End-stage renal disease -Patient dialyzes Monday, Wednesday, Friday -Nephrology consulted and appreciated -HD today  LUE aVF pulsatile with LUE larger than RUE ?CV Stenosis -Per nephrology, patient wiould need fistulogram once stable  Thrombocytopenia -unknown cause  Elevated INR -Present on admission, given vitamin K daily, now down to 1.1 -Continue to monitor  Essential hypertension -BP has been soft, requiring patient to receive several IV fluid boluses as well as blood transfusion  Anxiety/depression -Was on Klonopin  Metabolic bone disease -Continue calcitriol  Diabetes mellitus, type II -Hemoglobin A1c was 4.9 (08/30/2019)  -Januvia currently held -Hypoglycemia has resolved, will discontinue D5 half-normal saline -Placed on insulin sliding scale with CBG monitoring  Hypothyroidism -was on Synthroid  Coronary artery disease -Patient follows with cardiologist at [facility] stress test was in 2019 showing an EF of 52%, low risk study -Currently no complaints of chest pain -Aspirin held due to GI bleed -Coreg held due to soft BP  Intermittent mild confusion/acute metabolic encephalopathy -Continues to have intermittent confusion -Question whether this is due to GI bleeding versus other cause -Continue to monitor closely -CT head: no  evidence of acute intracranial abnormality. Chronic infarcts L parietal lobe, left basal ganglia, adjacent white matter  -Chest x-ray unremarkable for infection -ABG within normal limits, mild hypoxia -Ammonia level 33 -Discussed with son via phone, states patient has had intermittent confusion hallucinations prior to admission. Mental status worsened some after COVID infection in February 2021. Has been seeing Dr. Jannifer Franklin, neurology. Was diagnosed with Parkinsons, but states this was an incorrect diagnosis. Also states patient has had lower extremity weakness and gait abnormalities. -?underlying dementia -have reviewed outpatient neurology notes, no mention of mental/memory impairment or AMS -Neurology consulted and appreciated- started on high dose thiamine  -MRI as below, doubt this is the cause of patient's acute metabolic encephalopathy and intermittent confusion -EEG: Normal due to general background slowing with occasional triphasic waves.  Findings consistent with toxic/metabolic encephalopathy.  No epileptiform activity.  Acute CVA -MRI brain: Small acute infarction of the right centrum semiovale -Doubt this CVA explains patient's current symptoms of intermittent confusion -Neurology consulted and appreciated  -Hemoglobin A1c 4.9 on 09/03/2019 -Echocardiogram shows an EF of 65 to 16%, grade 1 diastolic dysfunction.  Trivial mitral valve regurg.  Mid LV cavitary obliteration, suspect degree of left ear likely moderate.  Mild aortic valve regurgitation. -Speech following -PT and OT of already assessed the patient and recommended SNF -Given GI bleed, holding off on any type of antiplatelet at this time -Lipid panel: Total cholesterol 60, HDL less than 10, LDL not calculated, triglycerides 163 -Hemoglobin A1c 4.9  Goals of care -Palliative care consulted and appreciated, patient was made DNI/DNR on 09/12/2019  Deconditioning -Continue PT/OT-recommended SNF  Poor oral intake -Has  had inadequate oral intake, likely due to GI function -Will continue to monitor -Nutrition consulted, continue supplements -Speech therapy consulted -was on TPN   Abdominal pain/Ileus -Abdominal x-ray on 09/09/2019 shows ileus -Abdominal x-ray on 09/11/2019 showed stable dilated small bowel loops, concerning for  distal small bowel obstruction or possible ileus -General surgery aware and following -Continue conservative management,  Was made n.p.o.  Fever/tachycardia-SIRS -Continues to spike fevers, up to 100.9 F.  Continued to have tachycardia and tachypnea -Blood cultures showed no growth -Chest x-ray shows atelectasis -Placed on incentive spirometry   Chronic diastolic CHF -Echocardiogram as above -Volume management with hemodialysis  Pertinent Labs and Studies  Significant Diagnostic Studies EEG  Result Date: 09/10/2019 Alexis Goodell, MD     09/10/2019  2:09 PM ELECTROENCEPHALOGRAM REPORT Patient: GEORGENA WEISHEIT       Room #: 2Z30Q EEG No. ID: [id number] Age: 69 y.o.        Sex: female Requesting Physician: Tanasia Budzinski Report Date:  09/10/2019       Interpreting Physician: Alexis Goodell History: FARRAN AMSDEN is an 69 y.o. female with altered mental status Medications: Insulin, Colace, Synthroid, Thiamine Conditions of Recording:  This is a 21 channel routine scalp EEG performed with bipolar and monopolar montages arranged in accordance to the international 10/20 system of electrode placement. One channel was dedicated to EKG recording. The patient is in the poorly responsive state. Description:  The background activity is slow and poorly organized.  It consists of a low voltage polymorphic delta activity that is continuous and diffusely distributed.  Also noted are occasional intermittent periodic discharges of triphasic morphology There is no activation of the background noted with stimulation. No epileptiform activity is noted.  Hyperventilation and intermittent photic stimulation  were not performed. IMPRESSION: This is an abnormal electroencephalogram due general background slowing with occasional triphasic waves.  These findings are consistent with a toxic/metabolic encephalopathy.  No epileptiform activity is noted.  Alexis Goodell, MD Neurology [phone] 09/10/2019, 2:02 PM   DG Abd 1 View  Result Date: 09/11/2019 CLINICAL DATA:  Gastrointestinal bleeding. EXAM: ABDOMEN - 1 VIEW COMPARISON:  September 09, 2019. FINDINGS: Stable dilated small bowel loops are noted concerning for distal small bowel obstruction or possibly ileus. Midline surgical staples are noted. Status post cholecystectomy. No definite colonic dilatation is noted. IMPRESSION: Stable dilated small bowel loops are noted concerning for distal small bowel obstruction or possibly ileus. Electronically Signed   By: Marijo Conception M.D.   On: 09/11/2019 10:51   CT HEAD WO CONTRAST  Result Date: 09/07/2019 CLINICAL DATA:  Altered mental status (AMS), unclear cause. EXAM: CT HEAD WITHOUT CONTRAST TECHNIQUE: Contiguous axial images were obtained from the base of the skull through the vertex without intravenous contrast. COMPARISON:  Head CT 08/23/2019, brain MRI 06/07/2019 FINDINGS: Brain: There is no evidence of acute intracranial hemorrhage, intracranial mass, midline shift or extra-axial fluid collection.Redemonstrated chronic cortically based left parietal lobe infarct. Redemonstrated chronic lacunar infarct within the left basal ganglia and adjacent white matter. No new demarcated infarct is identified. Background mild ill-defined hypoattenuation within the cerebral white matter is nonspecific, but consistent with chronic small vessel ischemic disease. Stable, mild generalized parenchymal atrophy. Partially empty sella turcica. Vascular: No hyperdense vessel.  Atherosclerotic calcifications Skull: Normal. Negative for fracture or focal lesion. Sinuses/Orbits: Frothy secretions and air-fluid level within the left maxillary  sinus. Small right frontal sinus osteoma. Sequela of prior right mastoidectomy. The remaining right mastoid air cells and mastoidectomy cavity are well aerated. No significant left mastoid effusion. Visualized orbits demonstrate no acute abnormality. IMPRESSION: 1. No evidence of acute intracranial abnormality. 2. Redemonstrated chronic infarcts within the left parietal lobe, as well as left basal ganglia and adjacent white matter. 3. Stable mild background generalized parenchymal atrophy  and chronic small vessel ischemic disease. 4. Left maxillary sinusitis. Electronically Signed   By: Kellie Simmering DO   On: 09/07/2019 10:14   CT Head Wo Contrast  Result Date: 08/23/2019 CLINICAL DATA:  Altered mental status. No reported injury. Generalized weakness. End-stage renal disease on dialysis. COVID 19 on 07/15/2019. EXAM: CT HEAD WITHOUT CONTRAST TECHNIQUE: Contiguous axial images were obtained from the base of the skull through the vertex without intravenous contrast. COMPARISON:  06/06/2019 head CT. FINDINGS: Brain: No evidence of parenchymal hemorrhage or extra-axial fluid collection. No mass lesion, mass effect, or midline shift. No CT evidence of acute infarction. Nonspecific moderate subcortical and periventricular white matter hypodensity, most in keeping with chronic small vessel ischemic change. Generalized cerebral volume loss. No ventriculomegaly. Vascular: No acute abnormality. Skull: No evidence of calvarial fracture. Sinuses/Orbits: No fluid levels. Minimal patchy debris and mucoperiosteal thickening in left maxillary sinus. Other: Stable postsurgical change in the right mastoid. The mastoid air cells are unopacified. IMPRESSION: 1. No evidence of acute intracranial abnormality. 2. Generalized cerebral volume loss and moderate chronic small vessel ischemic changes in the cerebral white matter. Electronically Signed   By: Ilona Sorrel M.D.   On: 08/23/2019 17:58   CT ANGIO PELVIS W OR WO  CONTRAST  Result Date: 08/31/2019 CLINICAL DATA:  GI bleeding, recurrent dark burgundy hematochezia this morning, post EGD and colonoscopy, diverticular bleed EXAM: CT ANGIOGRAPHY CHEST, ABDOMEN AND PELVIS TECHNIQUE: Multidetector CT imaging through the chest, abdomen and pelvis was performed using the standard protocol during bolus administration of intravenous contrast. Multiplanar reconstructed images and MIPs were obtained and reviewed to evaluate the vascular anatomy. CONTRAST:  122mL OMNIPAQUE IOHEXOL 350 MG/ML SOLN IV COMPARISON:  CT abdomen and pelvis 04/05/2013 FINDINGS: CTA CHEST FINDINGS Cardiovascular: Atherosclerotic calcifications aorta, proximal great vessels, and coronary arteries. Aorta normal caliber. No aortic aneurysm or dissection. Pulmonary arteries patent on non targeted exam. No pericardial effusion. Mediastinum/Nodes: Esophagus unremarkable.  No thoracic adenopathy. Lungs/Pleura: Lung apices not imaged by this protocol. Small RIGHT pleural effusion and compressive atelectasis of the RIGHT lower lobe. Question minimal patchy infiltrate RIGHT lung. No pneumothorax or definite mass/nodule. Musculoskeletal: RIGHT shoulder prosthesis. Osseous demineralization. No acute osseous findings. Review of the MIP images confirms the above findings. CTA ABDOMEN AND PELVIS FINDINGS VASCULAR Aorta: Normal caliber without aneurysm or dissection. Scattered atherosclerotic calcifications of abdominal aorta. Celiac: Mild plaque at origin of celiac artery. Minimal narrowing, less than 50%. No definite stenosis or thrombus. SMA: Plaque at SMA origin with slightly greater than 50% stenosis. Additional calcified plaque along the course of the SMA. Renals: Marked plaque at the origins of the renal arteries bilaterally. Renal arteries are diminutive, patient with end-stage renal disease. IMA: Patent though originates at significant plaque with high-grade stenosis of origin Inflow: Calcified plaque and minimal  thrombus in the common iliac arteries bilaterally. External iliac arteries patent. Minimal plaque in the internal iliac arteries. Normal caliber iliac vessels. Veins: IVC, iliac veins, common femoral veins, portal vein, SMV and splenic vein appear patent. Review of the MIP images confirms the above findings. NON-VASCULAR Hepatobiliary: Post cholecystectomy.  Liver unremarkable. Pancreas: Atrophic pancreas without mass Spleen: Normal appearance Adrenals/Urinary Tract: Adrenal glands normal appearance. Markedly atrophic kidneys consistent with end-stage renal disease. Stomach/Bowel: Linear high attenuation focus within the stomach 10 mm diameter on precontrast image 21, question biopsy clip versus ingested foreign body, persists throughout exam. Question polyps versus redundant mucosa along the lesser curvature, recommend correlation with prior EGD. Stomach otherwise unremarkable. Duodenal bulb  and sweep normal appearance. Dilated proximal small bowel loops with segment of small bowel wall thickening in the RIGHT mid abdomen question enteritis. Distal small loops beyond this site are decompressed. Cecum and ascending colon normal appearance. Appendix not visualized. Bowel wall thickening of the hepatic flexure of the colon. High attenuation is seen within the lumen of the hepatic flexure on arterial phase (series 13, image 73) not seen on precontrast imaging and increasing on delayed images (series 19, image 45) consistent with a focus of active GI bleeding. Remainder of colon unremarkable. No additional colonic wall thickening or additional sites of active bleeding. Lymphatic: No adenopathy Reproductive: Atrophic uterus with unremarkable adnexa Other: No free air. Small amount of perihepatic free fluid. Small periumbilical hernia containing fat. Musculoskeletal: Osseous demineralization. Review of the MIP images confirms the above findings. IMPRESSION: Extensive atherosclerotic disease changes without evidence of  aortic aneurysm or dissection. Plaque at the origins of the celiac and superior mesenteric arteries with less than 50% narrowing at celiac origin and greater than 50% narrowing at SMA origin. Focus of active GI bleeding at hepatic flexure of colon, which demonstrates wall thickening which could be the result of infection, ischemia, or inflammatory bowel disease. Additional thickened small bowel loop in the LEFT mid abdomen question enteritis, associated with dilatation of small bowel loops proximal to this site suggesting a component of obstruction. Question gastric polyps versus redundant mucosa; recommend correlation with prior endoscopy. Small RIGHT pleural effusion and RIGHT basilar atelectasis. Small amount of nonspecific perihepatic free fluid. Findings called to Dr. Lennox Grumbles on 08/31/2019 at 2048 hrs. Electronically Signed   By: Lavonia Dana M.D.   On: 08/31/2019 20:50   CT ANGIO ABDOMEN W &/OR WO CONTRAST  Result Date: 08/31/2019 CLINICAL DATA:  GI bleeding, recurrent dark burgundy hematochezia this morning, post EGD and colonoscopy, diverticular bleed EXAM: CT ANGIOGRAPHY CHEST, ABDOMEN AND PELVIS TECHNIQUE: Multidetector CT imaging through the chest, abdomen and pelvis was performed using the standard protocol during bolus administration of intravenous contrast. Multiplanar reconstructed images and MIPs were obtained and reviewed to evaluate the vascular anatomy. CONTRAST:  196mL OMNIPAQUE IOHEXOL 350 MG/ML SOLN IV COMPARISON:  CT abdomen and pelvis 04/05/2013 FINDINGS: CTA CHEST FINDINGS Cardiovascular: Atherosclerotic calcifications aorta, proximal great vessels, and coronary arteries. Aorta normal caliber. No aortic aneurysm or dissection. Pulmonary arteries patent on non targeted exam. No pericardial effusion. Mediastinum/Nodes: Esophagus unremarkable.  No thoracic adenopathy. Lungs/Pleura: Lung apices not imaged by this protocol. Small RIGHT pleural effusion and compressive atelectasis of the RIGHT  lower lobe. Question minimal patchy infiltrate RIGHT lung. No pneumothorax or definite mass/nodule. Musculoskeletal: RIGHT shoulder prosthesis. Osseous demineralization. No acute osseous findings. Review of the MIP images confirms the above findings. CTA ABDOMEN AND PELVIS FINDINGS VASCULAR Aorta: Normal caliber without aneurysm or dissection. Scattered atherosclerotic calcifications of abdominal aorta. Celiac: Mild plaque at origin of celiac artery. Minimal narrowing, less than 50%. No definite stenosis or thrombus. SMA: Plaque at SMA origin with slightly greater than 50% stenosis. Additional calcified plaque along the course of the SMA. Renals: Marked plaque at the origins of the renal arteries bilaterally. Renal arteries are diminutive, patient with end-stage renal disease. IMA: Patent though originates at significant plaque with high-grade stenosis of origin Inflow: Calcified plaque and minimal thrombus in the common iliac arteries bilaterally. External iliac arteries patent. Minimal plaque in the internal iliac arteries. Normal caliber iliac vessels. Veins: IVC, iliac veins, common femoral veins, portal vein, SMV and splenic vein appear patent. Review of the MIP images  confirms the above findings. NON-VASCULAR Hepatobiliary: Post cholecystectomy.  Liver unremarkable. Pancreas: Atrophic pancreas without mass Spleen: Normal appearance Adrenals/Urinary Tract: Adrenal glands normal appearance. Markedly atrophic kidneys consistent with end-stage renal disease. Stomach/Bowel: Linear high attenuation focus within the stomach 10 mm diameter on precontrast image 21, question biopsy clip versus ingested foreign body, persists throughout exam. Question polyps versus redundant mucosa along the lesser curvature, recommend correlation with prior EGD. Stomach otherwise unremarkable. Duodenal bulb and sweep normal appearance. Dilated proximal small bowel loops with segment of small bowel wall thickening in the RIGHT mid  abdomen question enteritis. Distal small loops beyond this site are decompressed. Cecum and ascending colon normal appearance. Appendix not visualized. Bowel wall thickening of the hepatic flexure of the colon. High attenuation is seen within the lumen of the hepatic flexure on arterial phase (series 13, image 73) not seen on precontrast imaging and increasing on delayed images (series 19, image 45) consistent with a focus of active GI bleeding. Remainder of colon unremarkable. No additional colonic wall thickening or additional sites of active bleeding. Lymphatic: No adenopathy Reproductive: Atrophic uterus with unremarkable adnexa Other: No free air. Small amount of perihepatic free fluid. Small periumbilical hernia containing fat. Musculoskeletal: Osseous demineralization. Review of the MIP images confirms the above findings. IMPRESSION: Extensive atherosclerotic disease changes without evidence of aortic aneurysm or dissection. Plaque at the origins of the celiac and superior mesenteric arteries with less than 50% narrowing at celiac origin and greater than 50% narrowing at SMA origin. Focus of active GI bleeding at hepatic flexure of colon, which demonstrates wall thickening which could be the result of infection, ischemia, or inflammatory bowel disease. Additional thickened small bowel loop in the LEFT mid abdomen question enteritis, associated with dilatation of small bowel loops proximal to this site suggesting a component of obstruction. Question gastric polyps versus redundant mucosa; recommend correlation with prior endoscopy. Small RIGHT pleural effusion and RIGHT basilar atelectasis. Small amount of nonspecific perihepatic free fluid. Findings called to Dr. Lennox Grumbles on 08/31/2019 at 2048 hrs. Electronically Signed   By: Lavonia Dana M.D.   On: 08/31/2019 20:50   MR ANGIO HEAD WO CONTRAST  Result Date: 09/11/2019 CLINICAL DATA:  Follow-up examination for acute stroke. Altered mental status. EXAM: MRA HEAD  WITHOUT CONTRAST TECHNIQUE: Angiographic images of the Circle of Willis were obtained using MRA technique without intravenous contrast. COMPARISON:  Prior MRI from 09/09/2019 as well as prior CTA from 03/29/2019. FINDINGS: ANTERIOR CIRCULATION: Examination severely degraded by motion artifact, markedly limiting assessment. Visualized distal cervical segments of the internal carotid arteries are grossly patent with antegrade flow. Petrous segments widely patent bilaterally. Cavernous/supraclinoid ICAs grossly patent, although evaluation markedly limited by motion artifact. No obvious high-grade stenosis. A1 segments patent bilaterally. Grossly negative anterior communicating artery complex. Anterior cerebral arteries grossly perfused to their distal aspects without obvious stenosis. Neither M1 segment well assessed due to motion. Distal MCA branches perfused and grossly symmetric. Neither MCA bifurcation well evaluated. POSTERIOR CIRCULATION: Dominant left vertebral artery patent to the vertebrobasilar junction without appreciable stenosis. Left PICA not seen. 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 Right PICA not seen. Basilar patent to its distal aspect without obvious stenosis. Superior cerebral arteries patent at their origins. Left PCA supplied primarily via the basilar. Right PCA supplied via the basilar as well as a small right posterior communicating artery. Both PCAs appear grossly perfused to their distal aspects on time-of-flight sequence. Evaluation for possible aneurysm or other vascular abnormality severely  limited on this motion degraded exam. IMPRESSION: 1. Markedly limited exam due to severe motion artifact. 2. Grossly stable appearance of the intracranial circulation, with no new large vessel occlusion. No other definite new or progressive findings identified on this limited exam. 3. 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 Electronically Signed   By: Jeannine Boga M.D.   On: 09/11/2019 19:01   MR BRAIN WO CONTRAST  Result Date: 09/09/2019 CLINICAL DATA:  Altered mental status EXAM: MRI HEAD WITHOUT CONTRAST TECHNIQUE: Multiplanar, multiecho pulse sequences of the brain and surrounding structures were obtained without intravenous contrast. COMPARISON:  06/07/2019 FINDINGS: Motion artifact present.  SWI sequence is nondiagnostic. Brain: There is a small focus of reduced diffusion in the right centrum semiovale. There is no intracranial mass or significant mass effect. Patchy and confluent areas of T2 hyperintensity in the supratentorial white matter are nonspecific but may reflect mild to moderate chronic microvascular ischemic changes. 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 There is no hydrocephalus or extra-axial fluid collection. Vascular: Major vessel flow voids at the skull base are preserved. Skull and upper cervical spine: Normal marrow signal is preserved. Sinuses/Orbits: Mild mucosal thickening with small left maxillary sinus air-fluid level. Orbits are unremarkable. Other: Sella is unremarkable.  Mastoid air cells are clear. IMPRESSION: Motion degraded study. 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 Nonspecific small left maxillary sinus air-fluid level, which could reflect acute sinusitis in the appropriate clinical setting. Electronically Signed   By: Macy Mis M.D.   On: 09/09/2019 17:44   NM GI Blood Loss  Result Date: 09/03/2019 CLINICAL DATA:  GI bleed. EXAM: NUCLEAR MEDICINE GASTROINTESTINAL BLEEDING SCAN TECHNIQUE: Sequential abdominal images were obtained following intravenous administration of Tc-62m labeled red blood cells. RADIOPHARMACEUTICALS:  24.5 mCi Tc-57m pertechnetate in-vitro labeled red cells. COMPARISON:  Recent CT angiography and IR embolization  of the hepatic flexure. FINDINGS: Imaging obtained over the course of 2 hours. There is radiotracer accumulation in the right upper quadrant in the region of the hepatic flexure that courses to the midline during first hour, appears at image 38/60. There is some to and fro motion of radiotracer activity. This radiotracer persists in the central abdomen in the region of the transverse colon, however does travel to the left upper quadrant on second hour imaging. There is some increased tracer activity in the second hour. There is mild patient motion artifact. IMPRESSION: GI bleeding study positive for GI bleed in the right upper quadrant, likely hepatic flexure of the colon. This is in same area of that seen on prior abdominal CTA and recently embolized. These results will be called to the ordering clinician or representative by the Radiologist Assistant, and communication documented in the PACS or [employer]. Electronically Signed   By: Keith Rake M.D.   On: 09/03/2019 01:34   IR Angiogram Visceral Selective  Result Date: 09/04/2019 INDICATION: 69 year old female with acute active lower GI bleed. CT arteriogram localizes the bleeding to the hepatic flexure of the colon.  EXAM: IR ULTRASOUND GUIDANCE VASC ACCESS RIGHT; ADDITIONAL ARTERIOGRAPHY; SELECTIVE VISCERAL ARTERIOGRAPHY; IR EMBO ART VEN HEMORR LYMPH EXTRAV INC GUIDE ROADMAPPING  1. Ultrasound-guided vascular access right common femoral artery 2. Celiac catheterization with arteriogram 3. Superior mesenteric artery catheterization with arteriogram 4. Right colic artery with arteriogram 5. Accessory middle colic artery with arteriogram 6. Distal branch of accessory middle colic artery with  arteriogram 7. Additional distal branch of accessory middle colic artery with arteriogram 8. Coil embolization  MEDICATIONS: None  ANESTHESIA/SEDATION: None.  CONTRAST:  17mL OMNIPAQUE IOHEXOL 300 MG/ML  SOLN  FLUOROSCOPY TIME:  Fluoroscopy Time: 8 minutes  0 seconds (464 mGy).  COMPLICATIONS: None immediate.  PROCEDURE: Informed consent was obtained from the patient following explanation of the procedure, risks, benefits and alternatives. The patient understands, agrees and consents for the procedure. All questions were addressed. A time out was performed prior to the initiation of the procedure. Maximal barrier sterile technique utilized including caps, mask, sterile gowns, sterile gloves, large sterile drape, hand hygiene, and Betadine prep.  The was interrogated with ultrasound and found to be widely patent. An image was obtained and stored for the medical record. Local anesthesia was attained by infiltration with 1% lidocaine. A small dermatotomy was made. Under real-time sonographic guidance, the vessel was punctured with a 21 gauge micropuncture needle. Using standard technique, the initial micro needle was exchanged over a 0.018 micro wire for a transitional 4 [place] micro sheath. The micro sheath was then exchanged over a 0.035 wire for a 5 French vascular sheath.  A C2 cobra catheter was advanced over a Bentson wire into the abdominal aorta. The celiac axis was catheterized. Arteriography was performed. Conventional celiac arterial anatomy. No evidence of replaced middle colic artery. No evidence of active hemorrhage.  The catheter was next advanced into the superior mesenteric artery. A superior mesenteric arteriogram was performed. There appears to be a faint focus of contrast extravasation in the region of the hepatic flexure. This appears to represent a watershed zone between the main and an accessory middle colic artery.  A renegade ST microcatheter was advanced over a Fathom 16 wire. The first artery selected was the right colic artery. Arteriography was performed. No evidence of active hemorrhage.  The artery was then used to select the accessory middle colic artery. Arteriography was performed. There is a small focus of active hemorrhage in the  hepatic flexure. The active hemorrhage appears to be arising from a region of numerous small thread like arteries. In an effort to perform a more selective embolization, the microcatheter was advanced into a more inferior distal branch of the accessory middle colic artery. Arteriography was performed. There is persistent mild hemorrhage. However, there is significant cross flow between the arterial branches.  Coil embolization was initiated. This distal accessory branch was successfully coil embolized. The catheter was then advanced into the more superior distal branch of the accessory middle colic artery. There is persistent contrast extravasation. Therefore, coil embolization was performed again. The coil pack was carried back into the main trunk of the accessory middle colic artery. Follow-up arteriography confirms complete stasis.  While there is likely some collateral flow from the right were branch of the main middle colic artery, the decision was made not to pursue further embolization for fear of result in ischemia. This embolization should significantly decrease the pressure head to the site of bleeding facilitating healing.  The catheters were removed. Hemostasis was attained with the assistance of a 6 French Angio-Seal device.  IMPRESSION: 1. Small focus of active arterial extravasation at the hepatic flexure. 2. Successful coil embolization of an accessory branch of the middle colic artery.  Signed,  Criselda Peaches, MD, [place]  [facility]  [facility] Radiology   Electronically Signed   By: Jacqulynn Cadet M.D.   On: 09/01/2019 12:31   IR Angiogram Visceral Selective  Result Date:  09/01/2019 INDICATION: 69 year old female with acute active lower GI bleed. CT arteriogram localizes the bleeding to the hepatic flexure of the colon. EXAM: IR ULTRASOUND GUIDANCE VASC ACCESS RIGHT; ADDITIONAL ARTERIOGRAPHY; SELECTIVE VISCERAL ARTERIOGRAPHY; IR EMBO ART  VEN HEMORR LYMPH EXTRAV INC GUIDE ROADMAPPING 1. Ultrasound-guided vascular access right common femoral artery 2. Celiac catheterization with arteriogram 3. Superior mesenteric artery catheterization with arteriogram 4. Right colic artery with arteriogram 5. Accessory middle colic artery with arteriogram 6. Distal branch of accessory middle colic artery with arteriogram 7. Additional distal branch of accessory middle colic artery with arteriogram 8. Coil embolization MEDICATIONS: None ANESTHESIA/SEDATION: None. CONTRAST:  24mL OMNIPAQUE IOHEXOL 300 MG/ML  SOLN FLUOROSCOPY TIME:  Fluoroscopy Time: 8 minutes 0 seconds (464 mGy). COMPLICATIONS: None immediate. PROCEDURE: Informed consent was obtained from the patient following explanation of the procedure, risks, benefits and alternatives. The patient understands, agrees and consents for the procedure. All questions were addressed. A time out was performed prior to the initiation of the procedure. Maximal barrier sterile technique utilized including caps, mask, sterile gowns, sterile gloves, large sterile drape, hand hygiene, and Betadine prep. The was interrogated with ultrasound and found to be widely patent. An image was obtained and stored for the medical record. Local anesthesia was attained by infiltration with 1% lidocaine. A small dermatotomy was made. Under real-time sonographic guidance, the vessel was punctured with a 21 gauge micropuncture needle. Using standard technique, the initial micro needle was exchanged over a 0.018 micro wire for a transitional 4 [place] micro sheath. The micro sheath was then exchanged over a 0.035 wire for a 5 French vascular sheath. A C2 cobra catheter was advanced over a Bentson wire into the abdominal aorta. The celiac axis was catheterized. Arteriography was performed. Conventional celiac arterial anatomy. No evidence of replaced middle colic artery. No evidence of active hemorrhage. The catheter was next advanced into the  superior mesenteric artery. A superior mesenteric arteriogram was performed. There appears to be a faint focus of contrast extravasation in the region of the hepatic flexure. This appears to represent a watershed zone between the main and an accessory middle colic artery. A renegade ST microcatheter was advanced over a Fathom 16 wire. The first artery selected was the right colic artery. Arteriography was performed. No evidence of active hemorrhage. The artery was then used to select the accessory middle colic artery. Arteriography was performed. There is a small focus of active hemorrhage in the hepatic flexure. The active hemorrhage appears to be arising from a region of numerous small thread like arteries. In an effort to perform a more selective embolization, the microcatheter was advanced into a more inferior distal branch of the accessory middle colic artery. Arteriography was performed. There is persistent mild hemorrhage. However, there is significant cross flow between the arterial branches. Coil embolization was initiated. This distal accessory branch was successfully coil embolized. The catheter was then advanced into the more superior distal branch of the accessory middle colic artery. There is persistent contrast extravasation. Therefore, coil embolization was performed again. The coil pack was carried back into the main trunk of the accessory middle colic artery. Follow-up arteriography confirms complete stasis. While there is likely some collateral flow from the right were branch of the main middle colic artery, the decision was made not to pursue further embolization for fear of result in ischemia. This embolization should significantly decrease the pressure head to the site of bleeding facilitating healing. The catheters were removed. Hemostasis was attained with the assistance of a  6 French Angio-Seal device. IMPRESSION: 1. Small focus of active arterial extravasation at the hepatic flexure. 2.  Successful coil embolization of an accessory branch of the middle colic artery. Signed, Criselda Peaches, MD, [place] [facility] Radiology Electronically Signed   By: Jacqulynn Cadet M.D.   On: 09/01/2019 12:31   IR Angiogram Selective Each Additional Vessel  Result Date: 09/04/2019 INDICATION: 69 year old female with acute active lower GI bleed. CT arteriogram localizes the bleeding to the hepatic flexure of the colon.  EXAM: IR ULTRASOUND GUIDANCE VASC ACCESS RIGHT; ADDITIONAL ARTERIOGRAPHY; SELECTIVE VISCERAL ARTERIOGRAPHY; IR EMBO ART VEN HEMORR LYMPH EXTRAV INC GUIDE ROADMAPPING  1. Ultrasound-guided vascular access right common femoral artery 2. Celiac catheterization with arteriogram 3. Superior mesenteric artery catheterization with arteriogram 4. Right colic artery with arteriogram 5. Accessory middle colic artery with arteriogram 6. Distal branch of accessory middle colic artery with arteriogram 7. Additional distal branch of accessory middle colic artery with arteriogram 8. Coil embolization  MEDICATIONS: None  ANESTHESIA/SEDATION: None.  CONTRAST:  63mL OMNIPAQUE IOHEXOL 300 MG/ML  SOLN  FLUOROSCOPY TIME:  Fluoroscopy Time: 8 minutes 0 seconds (464 mGy).  COMPLICATIONS: None immediate.  PROCEDURE: Informed consent was obtained from the patient following explanation of the procedure, risks, benefits and alternatives. The patient understands, agrees and consents for the procedure. All questions were addressed. A time out was performed prior to the initiation of the procedure. Maximal barrier sterile technique utilized including caps, mask, sterile gowns, sterile gloves, large sterile drape, hand hygiene, and Betadine prep.  The was interrogated with ultrasound and found to be widely patent. An image was obtained and stored for the medical record. Local anesthesia was attained by infiltration with 1% lidocaine. A small dermatotomy was made.  Under real-time sonographic guidance, the vessel was punctured with a 21 gauge micropuncture needle. Using standard technique, the initial micro needle was exchanged over a 0.018 micro wire for a transitional 4 [place] micro sheath. The micro sheath was then exchanged over a 0.035 wire for a 5 French vascular sheath.  A C2 cobra catheter was advanced over a Bentson wire into the abdominal aorta. The celiac axis was catheterized. Arteriography was performed. Conventional celiac arterial anatomy. No evidence of replaced middle colic artery. No evidence of active hemorrhage.  The catheter was next advanced into the superior mesenteric artery. A superior mesenteric arteriogram was performed. There appears to be a faint focus of contrast extravasation in the region of the hepatic flexure. This appears to represent a watershed zone between the main and an accessory middle colic artery.  A renegade ST microcatheter was advanced over a Fathom 16 wire. The first artery selected was the right colic artery. Arteriography was performed. No evidence of active hemorrhage.  The artery was then used to select the accessory middle colic artery. Arteriography was performed. There is a small focus of active hemorrhage in the hepatic flexure. The active hemorrhage appears to be arising from a region of numerous small thread like arteries. In an effort to perform a more selective embolization, the microcatheter was advanced into a more inferior distal branch of the accessory middle colic artery. Arteriography was performed. There is persistent mild hemorrhage. However, there is significant cross flow between the arterial branches.  Coil embolization was initiated. This distal accessory branch was successfully coil embolized. The catheter was then advanced into the more superior distal branch of the accessory middle colic artery. There is persistent contrast extravasation. Therefore, coil embolization was performed again.  The coil  pack was carried back into the main trunk of the accessory middle colic artery. Follow-up arteriography confirms complete stasis.  While there is likely some collateral flow from the right were branch of the main middle colic artery, the decision was made not to pursue further embolization for fear of result in ischemia. This embolization should significantly decrease the pressure head to the site of bleeding facilitating healing.  The catheters were removed. Hemostasis was attained with the assistance of a 6 French Angio-Seal device.  IMPRESSION: 1. Small focus of active arterial extravasation at the hepatic flexure. 2. Successful coil embolization of an accessory branch of the middle colic artery.  Signed,  Criselda Peaches, MD, [place]  [facility]  [facility] Radiology   Electronically Signed   By: Jacqulynn Cadet M.D.   On: 09/01/2019 12:31   IR Angiogram Selective Each Additional Vessel  Result Date: 09/01/2019 INDICATION: 69 year old female with acute active lower GI bleed. CT arteriogram localizes the bleeding to the hepatic flexure of the colon. EXAM: IR ULTRASOUND GUIDANCE VASC ACCESS RIGHT; ADDITIONAL ARTERIOGRAPHY; SELECTIVE VISCERAL ARTERIOGRAPHY; IR EMBO ART VEN HEMORR LYMPH EXTRAV INC GUIDE ROADMAPPING 1. Ultrasound-guided vascular access right common femoral artery 2. Celiac catheterization with arteriogram 3. Superior mesenteric artery catheterization with arteriogram 4. Right colic artery with arteriogram 5. Accessory middle colic artery with arteriogram 6. Distal branch of accessory middle colic artery with arteriogram 7. Additional distal branch of accessory middle colic artery with arteriogram 8. Coil embolization MEDICATIONS: None ANESTHESIA/SEDATION: None. CONTRAST:  35mL OMNIPAQUE IOHEXOL 300 MG/ML  SOLN FLUOROSCOPY TIME:  Fluoroscopy Time: 8 minutes 0 seconds (464 mGy). COMPLICATIONS: None immediate. PROCEDURE: Informed consent was  obtained from the patient following explanation of the procedure, risks, benefits and alternatives. The patient understands, agrees and consents for the procedure. All questions were addressed. A time out was performed prior to the initiation of the procedure. Maximal barrier sterile technique utilized including caps, mask, sterile gowns, sterile gloves, large sterile drape, hand hygiene, and Betadine prep. The was interrogated with ultrasound and found to be widely patent. An image was obtained and stored for the medical record. Local anesthesia was attained by infiltration with 1% lidocaine. A small dermatotomy was made. Under real-time sonographic guidance, the vessel was punctured with a 21 gauge micropuncture needle. Using standard technique, the initial micro needle was exchanged over a 0.018 micro wire for a transitional 4 [place] micro sheath. The micro sheath was then exchanged over a 0.035 wire for a 5 French vascular sheath. A C2 cobra catheter was advanced over a Bentson wire into the abdominal aorta. The celiac axis was catheterized. Arteriography was performed. Conventional celiac arterial anatomy. No evidence of replaced middle colic artery. No evidence of active hemorrhage. The catheter was next advanced into the superior mesenteric artery. A superior mesenteric arteriogram was performed. There appears to be a faint focus of contrast extravasation in the region of the hepatic flexure. This appears to represent a watershed zone between the main and an accessory middle colic artery. A renegade ST microcatheter was advanced over a Fathom 16 wire. The first artery selected was the right colic artery. Arteriography was performed. No evidence of active hemorrhage. The artery was then used to select the accessory middle colic artery. Arteriography was performed. There is a small focus of active hemorrhage in the hepatic flexure. The active hemorrhage appears to be arising from a region of numerous small  thread like arteries. In an effort to perform a  more selective embolization, the microcatheter was advanced into a more inferior distal branch of the accessory middle colic artery. Arteriography was performed. There is persistent mild hemorrhage. However, there is significant cross flow between the arterial branches. Coil embolization was initiated. This distal accessory branch was successfully coil embolized. The catheter was then advanced into the more superior distal branch of the accessory middle colic artery. There is persistent contrast extravasation. Therefore, coil embolization was performed again. The coil pack was carried back into the main trunk of the accessory middle colic artery. Follow-up arteriography confirms complete stasis. While there is likely some collateral flow from the right were branch of the main middle colic artery, the decision was made not to pursue further embolization for fear of result in ischemia. This embolization should significantly decrease the pressure head to the site of bleeding facilitating healing. The catheters were removed. Hemostasis was attained with the assistance of a 6 French Angio-Seal device. IMPRESSION: 1. Small focus of active arterial extravasation at the hepatic flexure. 2. Successful coil embolization of an accessory branch of the middle colic artery. Signed, Criselda Peaches, MD, [place] [facility] Radiology Electronically Signed   By: Jacqulynn Cadet M.D.   On: 09/01/2019 12:31   IR Angiogram Selective Each Additional Vessel  Result Date: 09/01/2019 INDICATION: 69 year old female with acute active lower GI bleed. CT arteriogram localizes the bleeding to the hepatic flexure of the colon. EXAM: IR ULTRASOUND GUIDANCE VASC ACCESS RIGHT; ADDITIONAL ARTERIOGRAPHY; SELECTIVE VISCERAL ARTERIOGRAPHY; IR EMBO ART VEN HEMORR LYMPH EXTRAV INC GUIDE ROADMAPPING 1. Ultrasound-guided vascular access right common  femoral artery 2. Celiac catheterization with arteriogram 3. Superior mesenteric artery catheterization with arteriogram 4. Right colic artery with arteriogram 5. Accessory middle colic artery with arteriogram 6. Distal branch of accessory middle colic artery with arteriogram 7. Additional distal branch of accessory middle colic artery with arteriogram 8. Coil embolization MEDICATIONS: None ANESTHESIA/SEDATION: None. CONTRAST:  55mL OMNIPAQUE IOHEXOL 300 MG/ML  SOLN FLUOROSCOPY TIME:  Fluoroscopy Time: 8 minutes 0 seconds (464 mGy). COMPLICATIONS: None immediate. PROCEDURE: Informed consent was obtained from the patient following explanation of the procedure, risks, benefits and alternatives. The patient understands, agrees and consents for the procedure. All questions were addressed. A time out was performed prior to the initiation of the procedure. Maximal barrier sterile technique utilized including caps, mask, sterile gowns, sterile gloves, large sterile drape, hand hygiene, and Betadine prep. The was interrogated with ultrasound and found to be widely patent. An image was obtained and stored for the medical record. Local anesthesia was attained by infiltration with 1% lidocaine. A small dermatotomy was made. Under real-time sonographic guidance, the vessel was punctured with a 21 gauge micropuncture needle. Using standard technique, the initial micro needle was exchanged over a 0.018 micro wire for a transitional 4 [place] micro sheath. The micro sheath was then exchanged over a 0.035 wire for a 5 French vascular sheath. A C2 cobra catheter was advanced over a Bentson wire into the abdominal aorta. The celiac axis was catheterized. Arteriography was performed. Conventional celiac arterial anatomy. No evidence of replaced middle colic artery. No evidence of active hemorrhage. The catheter was next advanced into the superior mesenteric artery. A superior mesenteric arteriogram was performed. There appears to be a  faint focus of contrast extravasation in the region of the hepatic flexure. This appears to represent a watershed zone between the main and an accessory middle colic artery. A renegade ST microcatheter was advanced over a Fathom 65  wire. The first artery selected was the right colic artery. Arteriography was performed. No evidence of active hemorrhage. The artery was then used to select the accessory middle colic artery. Arteriography was performed. There is a small focus of active hemorrhage in the hepatic flexure. The active hemorrhage appears to be arising from a region of numerous small thread like arteries. In an effort to perform a more selective embolization, the microcatheter was advanced into a more inferior distal branch of the accessory middle colic artery. Arteriography was performed. There is persistent mild hemorrhage. However, there is significant cross flow between the arterial branches. Coil embolization was initiated. This distal accessory branch was successfully coil embolized. The catheter was then advanced into the more superior distal branch of the accessory middle colic artery. There is persistent contrast extravasation. Therefore, coil embolization was performed again. The coil pack was carried back into the main trunk of the accessory middle colic artery. Follow-up arteriography confirms complete stasis. While there is likely some collateral flow from the right were branch of the main middle colic artery, the decision was made not to pursue further embolization for fear of result in ischemia. This embolization should significantly decrease the pressure head to the site of bleeding facilitating healing. The catheters were removed. Hemostasis was attained with the assistance of a 6 French Angio-Seal device. IMPRESSION: 1. Small focus of active arterial extravasation at the hepatic flexure. 2. Successful coil embolization of an accessory branch of the middle colic artery. Signed, Criselda Peaches, MD, [place] [facility] Radiology Electronically Signed   By: Jacqulynn Cadet M.D.   On: 09/01/2019 12:31   IR US Guide Vasc Access Right  Result Date: 09/01/2019 INDICATION: 69 year old female with acute active lower GI bleed. CT arteriogram localizes the bleeding to the hepatic flexure of the colon. EXAM: IR ULTRASOUND GUIDANCE VASC ACCESS RIGHT; ADDITIONAL ARTERIOGRAPHY; SELECTIVE VISCERAL ARTERIOGRAPHY; IR EMBO ART VEN HEMORR LYMPH EXTRAV INC GUIDE ROADMAPPING 1. Ultrasound-guided vascular access right common femoral artery 2. Celiac catheterization with arteriogram 3. Superior mesenteric artery catheterization with arteriogram 4. Right colic artery with arteriogram 5. Accessory middle colic artery with arteriogram 6. Distal branch of accessory middle colic artery with arteriogram 7. Additional distal branch of accessory middle colic artery with arteriogram 8. Coil embolization MEDICATIONS: None ANESTHESIA/SEDATION: None. CONTRAST:  41mL OMNIPAQUE IOHEXOL 300 MG/ML  SOLN FLUOROSCOPY TIME:  Fluoroscopy Time: 8 minutes 0 seconds (464 mGy). COMPLICATIONS: None immediate. PROCEDURE: Informed consent was obtained from the patient following explanation of the procedure, risks, benefits and alternatives. The patient understands, agrees and consents for the procedure. All questions were addressed. A time out was performed prior to the initiation of the procedure. Maximal barrier sterile technique utilized including caps, mask, sterile gowns, sterile gloves, large sterile drape, hand hygiene, and Betadine prep. The was interrogated with ultrasound and found to be widely patent. An image was obtained and stored for the medical record. Local anesthesia was attained by infiltration with 1% lidocaine. A small dermatotomy was made. Under real-time sonographic guidance, the vessel was punctured with a 21 gauge micropuncture needle. Using standard technique, the  initial micro needle was exchanged over a 0.018 micro wire for a transitional 4 [place] micro sheath. The micro sheath was then exchanged over a 0.035 wire for a 5 French vascular sheath. A C2 cobra catheter was advanced over a Bentson wire into the abdominal aorta. The celiac axis was catheterized. Arteriography was performed. Conventional celiac arterial anatomy. No evidence of replaced  middle colic artery. No evidence of active hemorrhage. The catheter was next advanced into the superior mesenteric artery. A superior mesenteric arteriogram was performed. There appears to be a faint focus of contrast extravasation in the region of the hepatic flexure. This appears to represent a watershed zone between the main and an accessory middle colic artery. A renegade ST microcatheter was advanced over a Fathom 16 wire. The first artery selected was the right colic artery. Arteriography was performed. No evidence of active hemorrhage. The artery was then used to select the accessory middle colic artery. Arteriography was performed. There is a small focus of active hemorrhage in the hepatic flexure. The active hemorrhage appears to be arising from a region of numerous small thread like arteries. In an effort to perform a more selective embolization, the microcatheter was advanced into a more inferior distal branch of the accessory middle colic artery. Arteriography was performed. There is persistent mild hemorrhage. However, there is significant cross flow between the arterial branches. Coil embolization was initiated. This distal accessory branch was successfully coil embolized. The catheter was then advanced into the more superior distal branch of the accessory middle colic artery. There is persistent contrast extravasation. Therefore, coil embolization was performed again. The coil pack was carried back into the main trunk of the accessory middle colic artery. Follow-up arteriography confirms complete stasis. While there  is likely some collateral flow from the right were branch of the main middle colic artery, the decision was made not to pursue further embolization for fear of result in ischemia. This embolization should significantly decrease the pressure head to the site of bleeding facilitating healing. The catheters were removed. Hemostasis was attained with the assistance of a 6 French Angio-Seal device. IMPRESSION: 1. Small focus of active arterial extravasation at the hepatic flexure. 2. Successful coil embolization of an accessory branch of the middle colic artery. Signed, Criselda Peaches, MD, [place] [facility] Radiology Electronically Signed   By: Jacqulynn Cadet M.D.   On: 09/01/2019 12:31   DG CHEST PORT 1 VIEW  Addendum Date: 09/11/2019   ADDENDUM REPORT: 09/11/2019 11:08 ADDENDUM: Findings were discussed with Dr. Jonnie Finner who indicated that the patient is s/p recent right hemicolectomy for GI bleeding. Indeed, patient is status post right hemicolectomy on 08/17/2019. Although no gas was seen on the portable chest x-ray of 09/07/2019, the patient had a supine abdomen on 09/09/2019 that did demonstrate a Rigler's sign, consistent with the presence of intraperitoneal free. Gas is not unexpected 7-10 days after surgery. Electronically Signed   By: Misty Stanley M.D.   On: 09/11/2019 11:08   Result Date: 09/11/2019 CLINICAL DATA:  GI bleed.  CHF. EXAM: PORTABLE CHEST 1 VIEW COMPARISON:  09/07/2019 FINDINGS: 1036 hours. Low lung volumes. Cardiopericardial silhouette is at upper limits of normal for size. No overt pulmonary edema with streaky opacity in the right mid lung and left base suggesting atelectasis. Right IJ central line tip overlies the mid SVC level. Lucency identified under both hemidiaphragms, consistent with intraperitoneal free air. Probable Rigler sign left abdomen, also suggesting intraperitoneal free air. Status post right shoulder replacement.  Telemetry leads overlie the chest. IMPRESSION: 1. Intraperitoneal free air without reported history of recent surgery. Bowel perforation a major concern. 2. Low lung volumes with borderline cardiomegaly and streaky bilateral airspace opacities suggesting atelectasis. Electronically Signed: By: Misty Stanley M.D. On: 09/11/2019 10:56   DG CHEST PORT 1 VIEW  Result Date: 09/07/2019 CLINICAL DATA:  Altered mental  status EXAM: PORTABLE CHEST 1 VIEW COMPARISON:  08/07/2019 FINDINGS: Right IJ central line is unchanged. Low lung volumes. Persistent left basilar atelectasis. No pleural effusion or pneumothorax. Stable cardiomediastinal contours. Partially imaged right reverse shoulder arthroplasty. IMPRESSION: No substantial change.  Persistent left basilar atelectasis. Electronically Signed   By: Macy Mis M.D.   On: 09/07/2019 08:32   DG CHEST PORT 1 VIEW  Result Date: 09/03/2019 CLINICAL DATA:  Status post PICC placement. EXAM: PORTABLE CHEST 1 VIEW COMPARISON:  Radiograph 08/16/2019 FINDINGS: Right internal jugular central venous catheter tip in the lower SVC. No pneumothorax. No other central lines are visualized. Low lung volumes. Increasing bibasilar atelectasis from prior. Unchanged heart size and mediastinal contours with aortic atherosclerosis. No large pleural effusion. Reverse right shoulder arthroplasty. IMPRESSION: 1. Tip of the right internal jugular central venous catheter in the lower SVC. No pneumothorax. 2. Low lung volumes with increasing bibasilar atelectasis. Electronically Signed   By: Keith Rake M.D.   On: 09/04/2019 18:18   DG Chest Port 1 View  Result Date: 08/24/2019 CLINICAL DATA:  GI bleeding. EXAM: PORTABLE CHEST 1 VIEW COMPARISON:  Yesterday FINDINGS: Low volume chest with interstitial crowding. Normal heart size for technique. Stable upper mediastinal contours. Postoperative thoracic inlet. Right glenohumeral arthroplasty. IMPRESSION: Stable low volume chest without  acute finding. Electronically Signed   By: Monte Fantasia M.D.   On: 08/15/2019 07:51   DG Chest Port 1 View  Result Date: 08/08/2019 CLINICAL DATA:  Pt here from home for black stools with red streaks, weakness, pale, abdominal pain, and hypotension. Dialysis patient. EXAM: PORTABLE CHEST 1 VIEW COMPARISON:  Chest radiograph 08/09/2019 FINDINGS: Stable cardiomediastinal contours. Low lung volumes. Scattered bibasilar opacities likely reflecting atelectasis. No new focal consolidation. No pneumothorax or significant pleural effusion. Status post right shoulder arthroplasty. IMPRESSION: Low lung volumes with bibasilar atelectasis. Electronically Signed   By: Audie Pinto M.D.   On: 08/27/2019 13:31   DG Abd Portable 1V  Result Date: 09/09/2019 CLINICAL DATA:  Ileus EXAM: PORTABLE ABDOMEN - 1 VIEW COMPARISON:  Plain film of the abdomen dated 07/16/2003. FINDINGS: Diffusely distended gas-filled loops of small bowel within the abdomen and pelvis, measuring up to approximately 5 cm diameter. Upper abdomen is excluded. Surgical staples overlie the RIGHT lower abdomen. Surgical clips are also seen in the RIGHT upper quadrant, consistent with cholecystectomy. IMPRESSION: Diffusely distended gas-filled loops of small bowel within the abdomen and pelvis, measuring up to 5 cm diameter, compatible with the given history of ileus. Electronically Signed   By: Franki Cabot M.D.   On: 09/09/2019 09:00   ECHOCARDIOGRAM COMPLETE  Result Date: 09/10/2019    ECHOCARDIOGRAM REPORT   Patient Name:   VIVIENE THURSTON Date of Exam: 09/10/2019 Medical Rec #:  [id number]          Height:       58.0 in Accession #:    [id number]         Weight:       180.8 lb Date of Birth:  August 26, 1950          BSA:          1.745 m Patient Age:    26 years           BP:           89/59 mmHg Patient Gender: F                  HR:  105 bpm. Exam Location:  Inpatient Procedure: 2D Echo, Cardiac Doppler and Color Doppler Indications:     Stroke 434.91/I163.0  History:        Patient has prior history of Echocardiogram examinations, most                 recent 12/28/2017. Risk Factors:Hypertension and Diabetes. ESRD.  Sonographer:    Clayton Lefort RDCS (AE) Referring Phys: [id number] [facility]  Sonographer Comments: Technically difficult study due to poor echo windows, suboptimal parasternal window, no subcostal window and patient is morbidly obese. Limited patient mobility. IMPRESSIONS  1. Left ventricular ejection fraction, by estimation, is 65 to 70%. The left ventricle has normal function. The left ventricle has no regional wall motion abnormalities. There is mild left ventricular hypertrophy. Left ventricular diastolic parameters are consistent with Grade I diastolic dysfunction (impaired relaxation).  2. Right ventricular systolic function is hyperdynamic. The right ventricular size is normal.  3. Left atrial size was mildly dilated.  4. The mitral valve is abnormal. Trivial mitral valve regurgitation. The mean mitral valve gradient is 4.0 mmHg with average heart rate of 105 bpm.  5. There appears to be mid-LV cavitary obliteration, incresing the doppler gradient across the LVOT, so I suspect the degree of AS is overestimated. That being said, there is probably moderate AS with a calcified valve of indetermine cusps. The aortic valve has an indeterminant number of cusps. Aortic valve regurgitation is mild. Moderate aortic valve stenosis. Aortic regurgitation PHT measures 366 msec. Aortic valve area, by VTI measures 0.95 cm. Aortic valve mean gradient measures 19.2 mmHg. Aortic  valve Vmax measures 3.06 m/s. FINDINGS  Left Ventricle: Left ventricular ejection fraction, by estimation, is 65 to 70%. The left ventricle has normal function. The left ventricle has no regional wall motion abnormalities. The left ventricular internal cavity size was normal in size. There is  mild left ventricular hypertrophy. Left ventricular diastolic parameters are  consistent with Grade I diastolic dysfunction (impaired relaxation). Indeterminate filling pressures. Right Ventricle: The right ventricular size is normal. No increase in right ventricular wall thickness. Right ventricular systolic function is hyperdynamic. Left Atrium: Left atrial size was mildly dilated. Right Atrium: Right atrial size was normal in size. Pericardium: There is no evidence of pericardial effusion. Mitral Valve: The mitral valve is abnormal. There is mild calcification of the mitral valve leaflet(s). Mild to moderate mitral annular calcification. Trivial mitral valve regurgitation. MV peak gradient, 9.5 mmHg. The mean mitral valve gradient is 4.0 mmHg with average heart rate of 105 bpm. Tricuspid Valve: The tricuspid valve is grossly normal. Tricuspid valve regurgitation is trivial. Aortic Valve: There appears to be mid-LV cavitary obliteration, incresing the doppler gradient across the LVOT, so I suspect the degree of AS is overestimated. That being said, there is probably moderate AS with a calcified valve of indetermine cusps. The aortic valve has an indeterminant number of cusps. Aortic valve regurgitation is mild. Aortic regurgitation PHT measures 366 msec. Moderate aortic stenosis is present. Aortic valve mean gradient measures 19.2 mmHg. Aortic valve peak gradient measures  37.4 mmHg. Aortic valve area, by VTI measures 0.95 cm. Pulmonic Valve: The pulmonic valve was grossly normal. Pulmonic valve regurgitation is not visualized. Aorta: The aortic root and ascending aorta are structurally normal, with no evidence of dilitation. IAS/Shunts: No atrial level shunt detected by color flow Doppler.  LEFT VENTRICLE PLAX 2D LVOT diam:     2.20 cm  Diastology LV SV:  31       LV e' lateral: 8.81 cm/s LV SV Index:   27 LVOT Area:     3.80 cm  RIGHT VENTRICLE RV Basal diam:  2.70 cm RV S prime:     8.16 cm/s TAPSE (M-mode): 1.9 cm LEFT ATRIUM             Index       RIGHT ATRIUM          Index  LA Vol (A2C):   59.8 ml 34.28 ml/m RA Area:     9.32 cm LA Vol (A4C):   47.3 ml 27.11 ml/m RA Volume:   16.50 ml 9.46 ml/m LA Biplane Vol: 53.7 ml 30.78 ml/m  AORTIC VALVE AV Area (Vmax):    1.68 cm AV Area (Vmean):   0.91 cm AV Area (VTI):     0.95 cm AV Vmax:           305.75 cm/s AV Vmean:          200.250 cm/s AV VTI:            0.487 m AV Peak Grad:      37.4 mmHg AV Mean Grad:      19.2 mmHg LVOT Vmax:         135.00 cm/s LVOT Vmean:        47.800 cm/s LVOT VTI:          0.122 m LVOT/AV VTI ratio: 0.25 AI PHT:            366 msec MITRAL VALVE            TRICUSPID VALVE MV Peak grad: 9.5 mmHg  TR Peak grad:   22.7 mmHg MV Mean grad: 4.0 mmHg  TR Vmax:        238.00 cm/s MV Vmax:      1.54 m/s MV Vmean:     82.6 cm/s SHUNTS                         Systemic VTI:  0.12 m                         Systemic Diam: 2.20 cm Lyman Bishop MD Electronically signed by Lyman Bishop MD Signature Date/Time: 09/10/2019/3:44:40 PM    Final    IR EMBO ART  VEN HEMORR LYMPH EXTRAV  INC GUIDE ROADMAPPING  Result Date: 09/01/2019 INDICATION: 69 year old female with acute active lower GI bleed. CT arteriogram localizes the bleeding to the hepatic flexure of the colon. EXAM: IR ULTRASOUND GUIDANCE VASC ACCESS RIGHT; ADDITIONAL ARTERIOGRAPHY; SELECTIVE VISCERAL ARTERIOGRAPHY; IR EMBO ART VEN HEMORR LYMPH EXTRAV INC GUIDE ROADMAPPING 1. Ultrasound-guided vascular access right common femoral artery 2. Celiac catheterization with arteriogram 3. Superior mesenteric artery catheterization with arteriogram 4. Right colic artery with arteriogram 5. Accessory middle colic artery with arteriogram 6. Distal branch of accessory middle colic artery with arteriogram 7. Additional distal branch of accessory middle colic artery with arteriogram 8. Coil embolization MEDICATIONS: None ANESTHESIA/SEDATION: None. CONTRAST:  56mL OMNIPAQUE IOHEXOL 300 MG/ML  SOLN FLUOROSCOPY TIME:  Fluoroscopy Time: 8 minutes 0 seconds (464 mGy). COMPLICATIONS:  None immediate. PROCEDURE: Informed consent was obtained from the patient following explanation of the procedure, risks, benefits and alternatives. The patient understands, agrees and consents for the procedure. All questions were addressed. A time out was performed prior to the initiation of the procedure. Maximal barrier sterile technique utilized including caps,  mask, sterile gowns, sterile gloves, large sterile drape, hand hygiene, and Betadine prep. The was interrogated with ultrasound and found to be widely patent. An image was obtained and stored for the medical record. Local anesthesia was attained by infiltration with 1% lidocaine. A small dermatotomy was made. Under real-time sonographic guidance, the vessel was punctured with a 21 gauge micropuncture needle. Using standard technique, the initial micro needle was exchanged over a 0.018 micro wire for a transitional 4 [place] micro sheath. The micro sheath was then exchanged over a 0.035 wire for a 5 French vascular sheath. A C2 cobra catheter was advanced over a Bentson wire into the abdominal aorta. The celiac axis was catheterized. Arteriography was performed. Conventional celiac arterial anatomy. No evidence of replaced middle colic artery. No evidence of active hemorrhage. The catheter was next advanced into the superior mesenteric artery. A superior mesenteric arteriogram was performed. There appears to be a faint focus of contrast extravasation in the region of the hepatic flexure. This appears to represent a watershed zone between the main and an accessory middle colic artery. A renegade ST microcatheter was advanced over a Fathom 16 wire. The first artery selected was the right colic artery. Arteriography was performed. No evidence of active hemorrhage. The artery was then used to select the accessory middle colic artery. Arteriography was performed. There is a small focus of active hemorrhage in the hepatic flexure. The active hemorrhage appears to  be arising from a region of numerous small thread like arteries. In an effort to perform a more selective embolization, the microcatheter was advanced into a more inferior distal branch of the accessory middle colic artery. Arteriography was performed. There is persistent mild hemorrhage. However, there is significant cross flow between the arterial branches. Coil embolization was initiated. This distal accessory branch was successfully coil embolized. The catheter was then advanced into the more superior distal branch of the accessory middle colic artery. There is persistent contrast extravasation. Therefore, coil embolization was performed again. The coil pack was carried back into the main trunk of the accessory middle colic artery. Follow-up arteriography confirms complete stasis. While there is likely some collateral flow from the right were branch of the main middle colic artery, the decision was made not to pursue further embolization for fear of result in ischemia. This embolization should significantly decrease the pressure head to the site of bleeding facilitating healing. The catheters were removed. Hemostasis was attained with the assistance of a 6 French Angio-Seal device. IMPRESSION: 1. Small focus of active arterial extravasation at the hepatic flexure. 2. Successful coil embolization of an accessory branch of the middle colic artery. Signed, Criselda Peaches, MD, [place] [facility] Radiology Electronically Signed   By: Jacqulynn Cadet M.D.   On: 09/01/2019 12:31   VAS US CAROTID  Result Date: 09/29/2019 Carotid Arterial Duplex Study Indications:       CVA and Recent Covid-19. Risk Factors:      Hypertension. Limitations        Today's exam was limited due to a central line, bandages,                    patient with altered mental status and the body habitus of                    the patient. Comparison Study:  Prior study from 09/11/2016 is  available for comparison Performing Technologist: Sharion Dove RVS  Examination Guidelines: A complete  evaluation includes B-mode imaging, spectral Doppler, color Doppler, and power Doppler as needed of all accessible portions of each vessel. Bilateral testing is considered an integral part of a complete examination. Limited examinations for reoccurring indications may be performed as noted.  Right Carotid Findings: Unable to insonate secondary to line/bandages Left Carotid Findings: +----------+--------+--------+--------+------------------+--------+           PSV cm/sEDV cm/sStenosisPlaque DescriptionComments +----------+--------+--------+--------+------------------+--------+ CCA Prox  33      12              heterogenous               +----------+--------+--------+--------+------------------+--------+ CCA Distal30      9               heterogenous               +----------+--------+--------+--------+------------------+--------+ ICA Prox  45      18              heterogenous               +----------+--------+--------+--------+------------------+--------+ ICA Distal65      24                                         +----------+--------+--------+--------+------------------+--------+ ECA       33      1                                          +----------+--------+--------+--------+------------------+--------+ +----------+--------+--------+--------+-------------------+           PSV cm/sEDV cm/sDescribeArm Pressure (mmHG) +----------+--------+--------+--------+-------------------+ [id number]                                          +----------+--------+--------+--------+-------------------+ +---------+--------+--+--------+--+---------+ VertebralPSV cm/s37EDV cm/s12Antegrade +---------+--------+--+--------+--+---------+   Summary:  Left Carotid: The extracranial vessels were near-normal with only minimal wall               thickening or plaque.  Vertebrals:  Left vertebral artery demonstrates antegrade flow. Subclavians: Normal flow hemodynamics were seen in the left subclavian artery. *See table(s) above for measurements and observations.  Electronically signed by Antony Contras MD on 10/11/19 at 8:07:51 AM.    Final     Microbiology Recent Results (from the past 240 hour(s))  Culture, blood (routine x 2)     Status: None   Collection Time: 09/11/19  2:58 PM   Specimen: BLOOD  Result Value Ref Range Status   Specimen Description BLOOD FOOT  Final   Special Requests   Final    BOTTLES DRAWN AEROBIC ONLY Blood Culture adequate volume   Culture   Final    NO GROWTH 5 DAYS Performed at [facility], [address] [place], [place] [address]    Report Status 09/16/2019 FINAL  Final  Culture, blood (routine x 2)     Status: None   Collection Time: 09/11/19  2:58 PM   Specimen: BLOOD  Result Value Ref Range Status   Specimen Description BLOOD FOOT  Final   Special Requests   Final    BOTTLES DRAWN AEROBIC ONLY Blood Culture adequate volume   Culture   Final    NO  GROWTH 5 DAYS Performed at [facility], [place] [address] [place], [place] [address]    Report Status 09/16/2019 FINAL  Final    Lab Basic Metabolic Panel: Recent Labs  Lab 2019-10-09 0247  NA 134*  K 5.2*  CL 99  CO2 23  GLUCOSE 203*  BUN 40*  CREATININE 4.08*  CALCIUM 7.8*  PHOS 4.1   Liver Function Tests: No results for input(s): AST, ALT, ALKPHOS, BILITOT, PROT, ALBUMIN in the last 168 hours. No results for input(s): LIPASE, AMYLASE in the last 168 hours. No results for input(s): AMMONIA in the last 168 hours. CBC: Recent Labs  Lab 2019/10/09 0247  WBC 7.6  HGB 9.2*  HCT 28.4*  MCV 102.9*  PLT 94*   Cardiac Enzymes: No results for input(s): CKTOTAL, CKMB, CKMBINDEX, TROPONINI in the last 168 hours. Sepsis Labs: Recent Labs  Lab 10-09-19 0247  WBC 7.6    Procedures/Operations   08/20/2019 EGD: Afew gastric polyps biopsied:  hyperplastic polyp with eroded granulation tissue and surface reactive change. Mild, nonbleeding, erosive gastropathy biopsied:Mild reactive gastropathy with some surface hyperplastic change, no H. Pylori.Duodenal nodule biopsied:peptic duodenitis.   08/23/2019 colonoscopy: 6 polyps removed located in right and left colon. Left colon diverticulosis. Solitary rectal ulcer, no stigmata of bleeding. Pathology shows tubular adenomas,noHGD. Ulcer not biopsied.   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

## 2019-10-07 NOTE — Plan of Care (Signed)
?  Problem: Clinical Measurements: ?Goal: Ability to maintain clinical measurements within normal limits will improve ?Outcome: Progressing ?Goal: Will remain free from infection ?Outcome: Progressing ?Goal: Diagnostic test results will improve ?Outcome: Progressing ?  ?

## 2019-10-07 NOTE — Significant Event (Signed)
Rapid Response Event Note  Overview: Called d/t hypotension, low SpO2, and unresponsiveness.      Initial Focused Assessment: Pt laying in bed with agonal respirations, unresponsive to sternal rub, with no gag. BP-52/25, HR-80. Pt is DNR. [name], NP called PTA RRT and ordered 1L LR bolus. Prior to hanging LR, pt went apneic and pulseless. [name], NP to bedside and updated. RN to pronounce and inform family.   Event Summary: Called: [id number] Arrived: 0652 Ended: 0703 Roberta Bryant

## 2019-10-07 DEATH — deceased

## 2019-11-28 ENCOUNTER — Ambulatory Visit: Payer: Medicare Other | Admitting: Neurology

## 2020-09-21 IMAGING — CR DG SHOULDER 2+V*L*
3 series · 3 of 3 positions shown · non-contrast
Comparison: None.

CLINICAL DATA: Fall, LEFT shoulder pain.

EXAM:
LEFT SHOULDER - 2+ VIEW

[shoulder grashey]
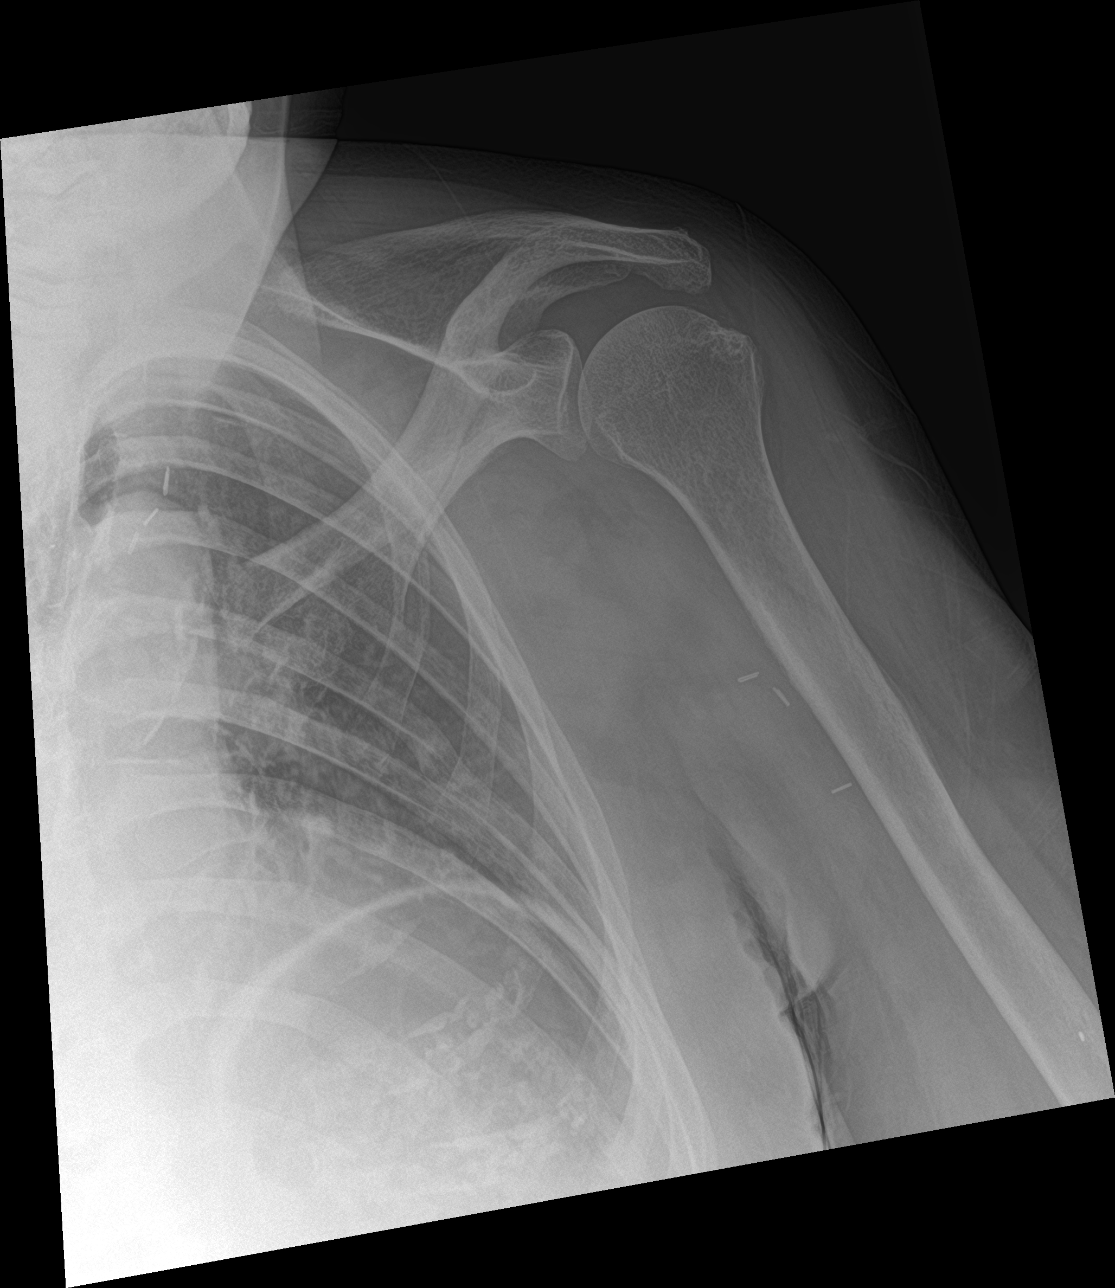

[shoulder y view]
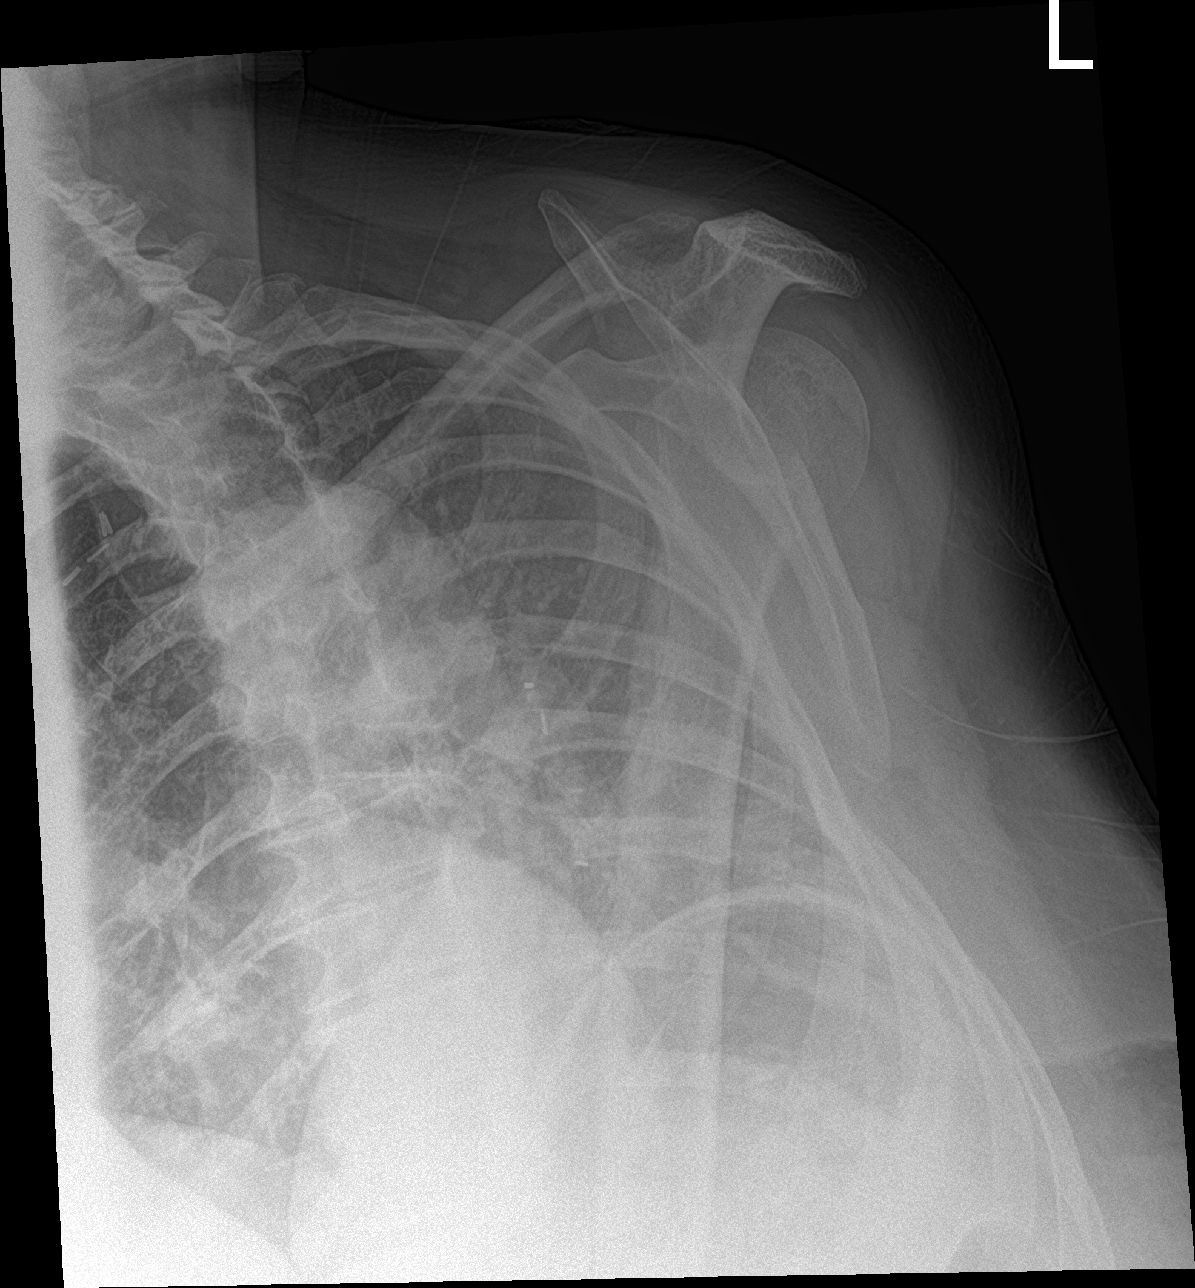

[shoulder axillary]
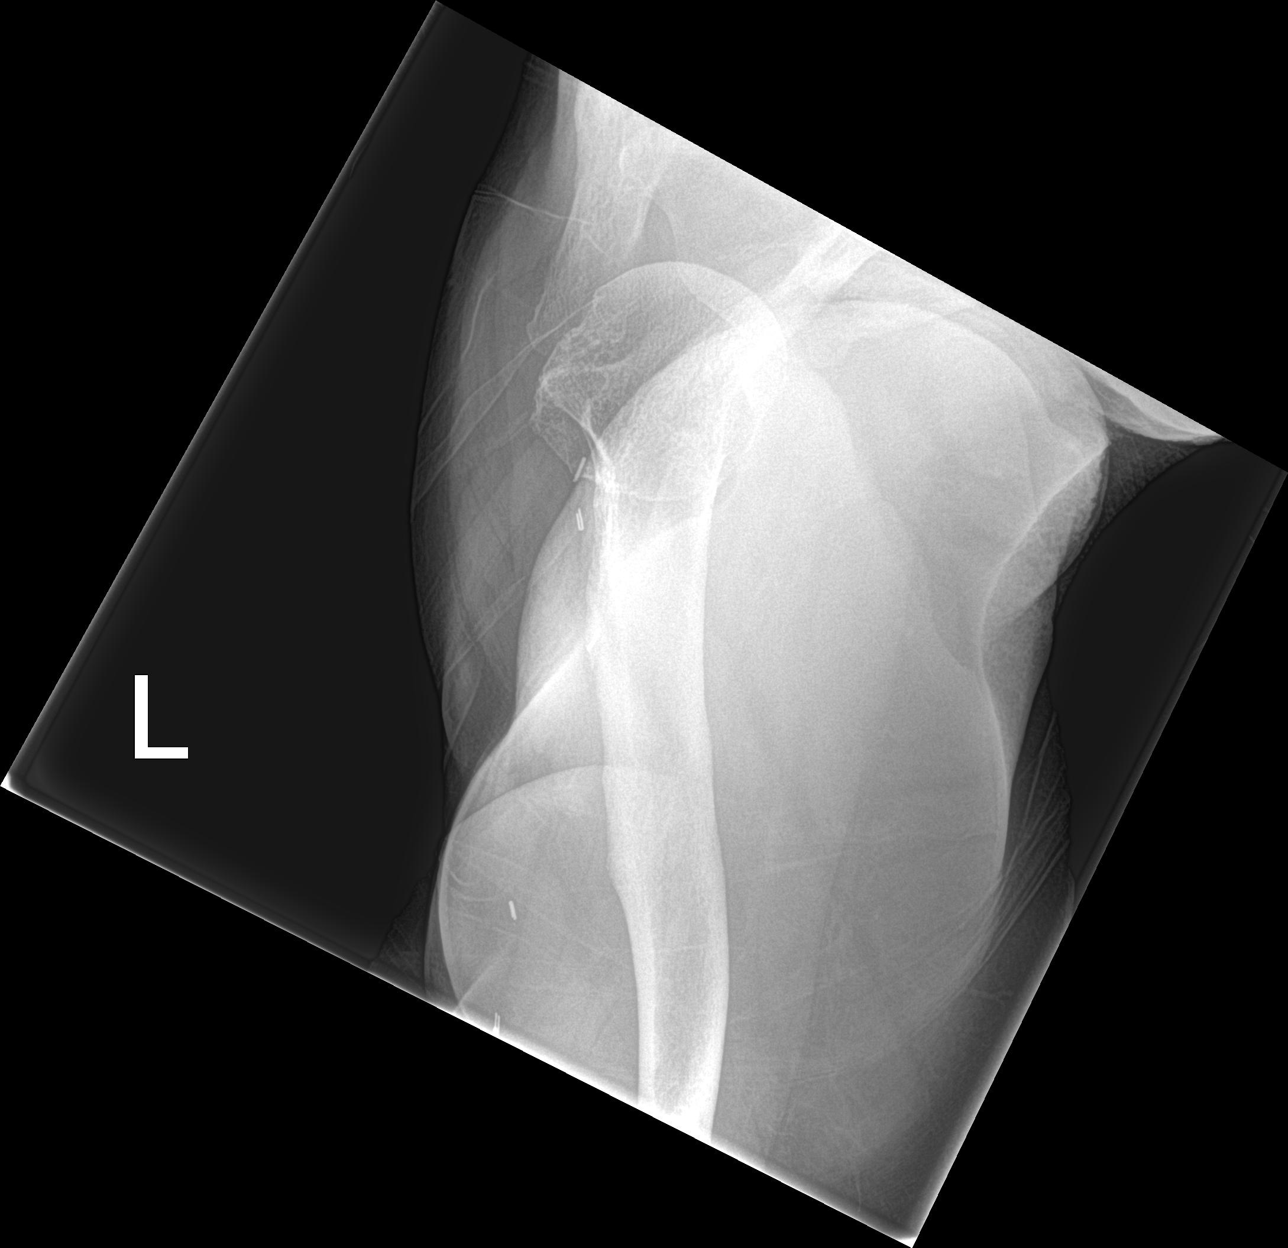

[3 of 3 positions shown; findings below may reference images not displayed]

FINDINGS: The humeral head is well-formed and located. Mild irregularity
greater tuberosity compatible with osteoarthrosis. The subacromial,
glenohumeral and acromioclavicular joint spaces are intact. No
destructive bony lesions. Soft tissue planes are non-suspicious.
Surgical clips LEFT chest and LEFT humeral soft tissues.
IMPRESSION: No acute fracture deformity or dislocation.
